# Patient Record
Sex: Female | Born: 1940
Health system: Southern US, Community
[De-identification: ages and names within clinical notes are randomized; demographics above are authoritative.]

## PROBLEM LIST (undated history)

## (undated) DIAGNOSIS — L409 Psoriasis, unspecified: Secondary | ICD-10-CM

## (undated) DIAGNOSIS — E538 Deficiency of other specified B group vitamins: Secondary | ICD-10-CM

## (undated) DIAGNOSIS — F419 Anxiety disorder, unspecified: Secondary | ICD-10-CM

## (undated) DIAGNOSIS — I10 Essential (primary) hypertension: Secondary | ICD-10-CM

## (undated) DIAGNOSIS — E039 Hypothyroidism, unspecified: Secondary | ICD-10-CM

## (undated) DIAGNOSIS — C50919 Malignant neoplasm of unspecified site of unspecified female breast: Secondary | ICD-10-CM

## (undated) DIAGNOSIS — Z9221 Personal history of antineoplastic chemotherapy: Secondary | ICD-10-CM

## (undated) DIAGNOSIS — Z803 Family history of malignant neoplasm of breast: Secondary | ICD-10-CM

## (undated) DIAGNOSIS — Z923 Personal history of irradiation: Secondary | ICD-10-CM

## (undated) DIAGNOSIS — E785 Hyperlipidemia, unspecified: Secondary | ICD-10-CM

## (undated) DIAGNOSIS — C73 Malignant neoplasm of thyroid gland: Secondary | ICD-10-CM

## (undated) DIAGNOSIS — Z9889 Other specified postprocedural states: Secondary | ICD-10-CM

## (undated) DIAGNOSIS — Z1379 Encounter for other screening for genetic and chromosomal anomalies: Principal | ICD-10-CM

## (undated) HISTORY — DX: Other specified postprocedural states: Z98.890

## (undated) HISTORY — DX: Encounter for other screening for genetic and chromosomal anomalies: Z13.79

## (undated) HISTORY — DX: Family history of malignant neoplasm of breast: Z80.3

## (undated) HISTORY — DX: Hypothyroidism, unspecified: E03.9

## (undated) HISTORY — DX: Essential (primary) hypertension: I10

## (undated) HISTORY — DX: Deficiency of other specified B group vitamins: E53.8

## (undated) HISTORY — DX: Psoriasis, unspecified: L40.9

## (undated) HISTORY — DX: Anxiety disorder, unspecified: F41.9

## (undated) HISTORY — DX: Hyperlipidemia, unspecified: E78.5

## (undated) HISTORY — DX: Malignant neoplasm of unspecified site of unspecified female breast: C50.919

## (undated) HISTORY — PX: MASTECTOMY: SHX3

## (undated) HISTORY — DX: Malignant neoplasm of thyroid gland: C73

## (undated) HISTORY — PX: BREAST LUMPECTOMY: SHX2

---

## 1997-06-17 ENCOUNTER — Other Ambulatory Visit: Admission: RE | Admit: 1997-06-17 | Discharge: 1997-06-17 | Payer: Self-pay | Admitting: Obstetrics and Gynecology

## 2000-02-12 DIAGNOSIS — Z923 Personal history of irradiation: Secondary | ICD-10-CM

## 2000-02-12 DIAGNOSIS — Z9221 Personal history of antineoplastic chemotherapy: Secondary | ICD-10-CM

## 2000-02-12 HISTORY — DX: Personal history of irradiation: Z92.3

## 2000-02-12 HISTORY — DX: Personal history of antineoplastic chemotherapy: Z92.21

## 2000-10-09 ENCOUNTER — Encounter: Admission: RE | Admit: 2000-10-09 | Discharge: 2000-10-09 | Payer: Self-pay | Admitting: Obstetrics and Gynecology

## 2000-10-09 ENCOUNTER — Encounter: Payer: Self-pay | Admitting: Obstetrics and Gynecology

## 2000-10-14 ENCOUNTER — Encounter: Payer: Self-pay | Admitting: Obstetrics and Gynecology

## 2000-10-14 ENCOUNTER — Encounter: Admission: RE | Admit: 2000-10-14 | Discharge: 2000-10-14 | Payer: Self-pay | Admitting: Obstetrics and Gynecology

## 2000-10-24 ENCOUNTER — Encounter: Admission: RE | Admit: 2000-10-24 | Discharge: 2000-10-24 | Payer: Self-pay | Admitting: General Surgery

## 2000-10-24 ENCOUNTER — Encounter: Payer: Self-pay | Admitting: General Surgery

## 2000-10-27 ENCOUNTER — Encounter: Admission: RE | Admit: 2000-10-27 | Discharge: 2000-10-27 | Payer: Self-pay | Admitting: General Surgery

## 2000-10-27 ENCOUNTER — Encounter (INDEPENDENT_AMBULATORY_CARE_PROVIDER_SITE_OTHER): Payer: Self-pay | Admitting: *Deleted

## 2000-10-27 ENCOUNTER — Encounter: Payer: Self-pay | Admitting: General Surgery

## 2000-10-27 ENCOUNTER — Ambulatory Visit (HOSPITAL_BASED_OUTPATIENT_CLINIC_OR_DEPARTMENT_OTHER): Admission: RE | Admit: 2000-10-27 | Discharge: 2000-10-27 | Payer: Self-pay | Admitting: Women's Health

## 2000-11-12 ENCOUNTER — Ambulatory Visit: Admission: RE | Admit: 2000-11-12 | Discharge: 2001-02-10 | Payer: Self-pay | Admitting: Radiation Oncology

## 2001-07-10 ENCOUNTER — Ambulatory Visit: Admission: RE | Admit: 2001-07-10 | Discharge: 2001-10-08 | Payer: Self-pay | Admitting: Radiation Oncology

## 2001-07-13 ENCOUNTER — Encounter: Admission: RE | Admit: 2001-07-13 | Discharge: 2001-07-13 | Payer: Self-pay | Admitting: Radiation Oncology

## 2001-11-04 ENCOUNTER — Encounter: Admission: RE | Admit: 2001-11-04 | Discharge: 2001-11-04 | Payer: Self-pay | Admitting: General Surgery

## 2001-11-04 ENCOUNTER — Encounter: Payer: Self-pay | Admitting: General Surgery

## 2001-11-26 ENCOUNTER — Ambulatory Visit: Admission: RE | Admit: 2001-11-26 | Discharge: 2001-11-26 | Payer: Self-pay | Admitting: Radiation Oncology

## 2002-03-05 ENCOUNTER — Other Ambulatory Visit: Admission: RE | Admit: 2002-03-05 | Discharge: 2002-03-05 | Payer: Self-pay | Admitting: Obstetrics and Gynecology

## 2002-05-24 ENCOUNTER — Encounter: Admission: RE | Admit: 2002-05-24 | Discharge: 2002-05-24 | Payer: Self-pay | Admitting: General Surgery

## 2002-05-24 ENCOUNTER — Encounter (INDEPENDENT_AMBULATORY_CARE_PROVIDER_SITE_OTHER): Payer: Self-pay | Admitting: Radiology

## 2002-05-24 ENCOUNTER — Other Ambulatory Visit: Admission: RE | Admit: 2002-05-24 | Discharge: 2002-05-24 | Payer: Self-pay | Admitting: Radiology

## 2002-05-24 ENCOUNTER — Encounter: Payer: Self-pay | Admitting: General Surgery

## 2002-06-07 ENCOUNTER — Encounter: Payer: Self-pay | Admitting: General Surgery

## 2002-06-08 ENCOUNTER — Inpatient Hospital Stay (HOSPITAL_COMMUNITY): Admission: RE | Admit: 2002-06-08 | Discharge: 2002-06-10 | Payer: Self-pay | Admitting: General Surgery

## 2002-06-08 ENCOUNTER — Encounter (INDEPENDENT_AMBULATORY_CARE_PROVIDER_SITE_OTHER): Payer: Self-pay | Admitting: *Deleted

## 2002-06-24 ENCOUNTER — Ambulatory Visit: Admission: RE | Admit: 2002-06-24 | Discharge: 2002-06-24 | Payer: Self-pay | Admitting: Oncology

## 2002-06-24 ENCOUNTER — Encounter: Payer: Self-pay | Admitting: Cardiology

## 2002-06-25 ENCOUNTER — Encounter: Payer: Self-pay | Admitting: Oncology

## 2002-06-25 ENCOUNTER — Encounter: Admission: RE | Admit: 2002-06-25 | Discharge: 2002-06-25 | Payer: Self-pay | Admitting: General Surgery

## 2002-06-25 ENCOUNTER — Encounter: Payer: Self-pay | Admitting: General Surgery

## 2002-06-25 ENCOUNTER — Ambulatory Visit (HOSPITAL_COMMUNITY): Admission: RE | Admit: 2002-06-25 | Discharge: 2002-06-25 | Payer: Self-pay | Admitting: Oncology

## 2002-08-02 ENCOUNTER — Encounter: Payer: Self-pay | Admitting: Oncology

## 2002-08-02 ENCOUNTER — Ambulatory Visit (HOSPITAL_COMMUNITY): Admission: RE | Admit: 2002-08-02 | Discharge: 2002-08-02 | Payer: Self-pay | Admitting: Oncology

## 2002-10-06 ENCOUNTER — Encounter (HOSPITAL_COMMUNITY): Admission: RE | Admit: 2002-10-06 | Discharge: 2002-10-07 | Payer: Self-pay | Admitting: Oncology

## 2002-11-23 ENCOUNTER — Encounter: Payer: Self-pay | Admitting: Oncology

## 2002-11-23 ENCOUNTER — Ambulatory Visit (HOSPITAL_COMMUNITY): Admission: RE | Admit: 2002-11-23 | Discharge: 2002-11-23 | Payer: Self-pay | Admitting: Oncology

## 2003-02-12 HISTORY — PX: REDUCTION MAMMAPLASTY: SUR839

## 2003-03-25 ENCOUNTER — Other Ambulatory Visit: Admission: RE | Admit: 2003-03-25 | Discharge: 2003-03-25 | Payer: Self-pay | Admitting: Obstetrics and Gynecology

## 2003-06-29 ENCOUNTER — Encounter: Admission: RE | Admit: 2003-06-29 | Discharge: 2003-06-29 | Payer: Self-pay | Admitting: Obstetrics and Gynecology

## 2003-10-04 ENCOUNTER — Ambulatory Visit (HOSPITAL_COMMUNITY): Admission: RE | Admit: 2003-10-04 | Discharge: 2003-10-04 | Payer: Self-pay | Admitting: Plastic Surgery

## 2003-10-05 ENCOUNTER — Encounter (INDEPENDENT_AMBULATORY_CARE_PROVIDER_SITE_OTHER): Payer: Self-pay | Admitting: Specialist

## 2003-10-05 ENCOUNTER — Observation Stay (HOSPITAL_COMMUNITY): Admission: RE | Admit: 2003-10-05 | Discharge: 2003-10-06 | Payer: Self-pay | Admitting: Plastic Surgery

## 2004-01-11 ENCOUNTER — Inpatient Hospital Stay (HOSPITAL_COMMUNITY): Admission: RE | Admit: 2004-01-11 | Discharge: 2004-01-13 | Payer: Self-pay | Admitting: Plastic Surgery

## 2004-02-08 ENCOUNTER — Ambulatory Visit (HOSPITAL_COMMUNITY): Admission: RE | Admit: 2004-02-08 | Discharge: 2004-02-08 | Payer: Self-pay | Admitting: Plastic Surgery

## 2004-04-23 ENCOUNTER — Ambulatory Visit: Payer: Self-pay | Admitting: Oncology

## 2004-04-25 ENCOUNTER — Ambulatory Visit (HOSPITAL_COMMUNITY): Admission: RE | Admit: 2004-04-25 | Discharge: 2004-04-25 | Payer: Self-pay | Admitting: Oncology

## 2004-05-01 ENCOUNTER — Encounter: Admission: RE | Admit: 2004-05-01 | Discharge: 2004-05-01 | Payer: Self-pay | Admitting: General Surgery

## 2004-05-07 ENCOUNTER — Encounter (INDEPENDENT_AMBULATORY_CARE_PROVIDER_SITE_OTHER): Payer: Self-pay | Admitting: Specialist

## 2004-05-07 ENCOUNTER — Ambulatory Visit (HOSPITAL_COMMUNITY): Admission: RE | Admit: 2004-05-07 | Discharge: 2004-05-07 | Payer: Self-pay | Admitting: Internal Medicine

## 2004-06-22 ENCOUNTER — Other Ambulatory Visit: Admission: RE | Admit: 2004-06-22 | Discharge: 2004-06-22 | Payer: Self-pay | Admitting: Obstetrics and Gynecology

## 2004-08-03 ENCOUNTER — Ambulatory Visit: Payer: Self-pay | Admitting: Oncology

## 2004-09-21 ENCOUNTER — Ambulatory Visit: Payer: Self-pay | Admitting: Oncology

## 2005-02-11 HISTORY — PX: THYROIDECTOMY: SHX17

## 2005-02-15 ENCOUNTER — Encounter: Admission: RE | Admit: 2005-02-15 | Discharge: 2005-02-15 | Payer: Self-pay | Admitting: General Surgery

## 2005-05-17 ENCOUNTER — Ambulatory Visit: Payer: Self-pay | Admitting: Oncology

## 2005-05-20 LAB — CBC WITH DIFFERENTIAL/PLATELET
BASO%: 0.4 % (ref 0.0–2.0)
MCHC: 33.8 g/dL (ref 32.0–36.0)
MONO#: 0.5 10*3/uL (ref 0.1–0.9)
RBC: 4.74 10*6/uL (ref 3.70–5.32)
WBC: 5 10*3/uL (ref 3.9–10.0)
lymph#: 1.7 10*3/uL (ref 0.9–3.3)

## 2005-05-20 LAB — COMPREHENSIVE METABOLIC PANEL
ALT: 16 U/L (ref 0–40)
CO2: 28 mEq/L (ref 19–32)
Calcium: 9.6 mg/dL (ref 8.4–10.5)
Chloride: 103 mEq/L (ref 96–112)
Potassium: 4.4 mEq/L (ref 3.5–5.3)
Sodium: 140 mEq/L (ref 135–145)
Total Protein: 8.2 g/dL (ref 6.0–8.3)

## 2005-05-20 LAB — CANCER ANTIGEN 27.29: CA 27.29: 14 U/mL (ref 0–39)

## 2005-05-29 ENCOUNTER — Ambulatory Visit (HOSPITAL_COMMUNITY): Admission: RE | Admit: 2005-05-29 | Discharge: 2005-05-29 | Payer: Self-pay | Admitting: Oncology

## 2005-06-05 ENCOUNTER — Ambulatory Visit (HOSPITAL_COMMUNITY): Admission: RE | Admit: 2005-06-05 | Discharge: 2005-06-05 | Payer: Self-pay | Admitting: Oncology

## 2005-07-09 ENCOUNTER — Other Ambulatory Visit: Admission: RE | Admit: 2005-07-09 | Discharge: 2005-07-09 | Payer: Self-pay | Admitting: Obstetrics and Gynecology

## 2005-07-11 ENCOUNTER — Encounter: Admission: RE | Admit: 2005-07-11 | Discharge: 2005-07-11 | Payer: Self-pay | Admitting: Obstetrics and Gynecology

## 2005-07-12 DIAGNOSIS — E890 Postprocedural hypothyroidism: Secondary | ICD-10-CM

## 2005-07-12 DIAGNOSIS — Z9089 Acquired absence of other organs: Secondary | ICD-10-CM

## 2005-07-12 DIAGNOSIS — Z9889 Other specified postprocedural states: Secondary | ICD-10-CM

## 2005-07-12 HISTORY — DX: Postprocedural hypothyroidism: E89.0

## 2005-07-12 HISTORY — DX: Acquired absence of other organs: Z90.89

## 2005-07-12 HISTORY — DX: Other specified postprocedural states: Z98.890

## 2005-07-24 ENCOUNTER — Encounter: Payer: Self-pay | Admitting: Endocrinology

## 2005-11-07 ENCOUNTER — Ambulatory Visit: Payer: Self-pay | Admitting: Oncology

## 2006-02-17 ENCOUNTER — Encounter: Admission: RE | Admit: 2006-02-17 | Discharge: 2006-02-17 | Payer: Self-pay | Admitting: Oncology

## 2006-03-17 ENCOUNTER — Encounter: Payer: Self-pay | Admitting: Endocrinology

## 2006-05-14 ENCOUNTER — Ambulatory Visit: Payer: Self-pay | Admitting: Oncology

## 2006-05-26 LAB — CBC WITH DIFFERENTIAL/PLATELET
BASO%: 0.2 % (ref 0.0–2.0)
EOS%: 1.3 % (ref 0.0–7.0)
HCT: 38.9 % (ref 34.8–46.6)
MCH: 31.9 pg (ref 26.0–34.0)
MCHC: 35.3 g/dL (ref 32.0–36.0)
NEUT%: 66.3 % (ref 39.6–76.8)
lymph#: 1.2 10*3/uL (ref 0.9–3.3)

## 2006-05-26 LAB — COMPREHENSIVE METABOLIC PANEL
ALT: 17 U/L (ref 0–35)
AST: 19 U/L (ref 0–37)
Alkaline Phosphatase: 153 U/L — ABNORMAL HIGH (ref 39–117)
CO2: 26 mEq/L (ref 19–32)
Creatinine, Ser: 0.92 mg/dL (ref 0.40–1.20)
Total Bilirubin: 0.3 mg/dL (ref 0.3–1.2)

## 2006-05-26 LAB — CANCER ANTIGEN 27.29: CA 27.29: 12 U/mL (ref 0–39)

## 2006-09-23 ENCOUNTER — Encounter: Payer: Self-pay | Admitting: Endocrinology

## 2006-11-13 ENCOUNTER — Ambulatory Visit: Payer: Self-pay | Admitting: Oncology

## 2006-11-17 LAB — COMPREHENSIVE METABOLIC PANEL
AST: 20 U/L (ref 0–37)
BUN: 17 mg/dL (ref 6–23)
CO2: 25 mEq/L (ref 19–32)
Calcium: 9.1 mg/dL (ref 8.4–10.5)
Chloride: 103 mEq/L (ref 96–112)
Creatinine, Ser: 0.93 mg/dL (ref 0.40–1.20)

## 2006-11-17 LAB — CBC WITH DIFFERENTIAL/PLATELET
Basophils Absolute: 0 10*3/uL (ref 0.0–0.1)
EOS%: 2.5 % (ref 0.0–7.0)
HCT: 39.5 % (ref 34.8–46.6)
HGB: 14 g/dL (ref 11.6–15.9)
MCH: 32.2 pg (ref 26.0–34.0)
NEUT%: 59.4 % (ref 39.6–76.8)
Platelets: 234 10*3/uL (ref 145–400)
lymph#: 0.9 10*3/uL (ref 0.9–3.3)

## 2006-11-17 LAB — CANCER ANTIGEN 27.29: CA 27.29: 10 U/mL (ref 0–39)

## 2006-11-24 ENCOUNTER — Ambulatory Visit (HOSPITAL_COMMUNITY): Admission: RE | Admit: 2006-11-24 | Discharge: 2006-11-24 | Payer: Self-pay | Admitting: Oncology

## 2007-02-12 DIAGNOSIS — E538 Deficiency of other specified B group vitamins: Secondary | ICD-10-CM

## 2007-02-12 HISTORY — DX: Deficiency of other specified B group vitamins: E53.8

## 2007-02-19 ENCOUNTER — Encounter: Payer: Self-pay | Admitting: Endocrinology

## 2007-03-17 ENCOUNTER — Encounter: Admission: RE | Admit: 2007-03-17 | Discharge: 2007-03-17 | Payer: Self-pay | Admitting: Oncology

## 2007-04-29 ENCOUNTER — Encounter: Payer: Self-pay | Admitting: Endocrinology

## 2007-05-14 ENCOUNTER — Ambulatory Visit: Payer: Self-pay | Admitting: Oncology

## 2007-05-18 LAB — CBC WITH DIFFERENTIAL/PLATELET
Basophils Absolute: 0 10*3/uL (ref 0.0–0.1)
Eosinophils Absolute: 0 10*3/uL (ref 0.0–0.5)
HCT: 39 % (ref 34.8–46.6)
LYMPH%: 34.8 % (ref 14.0–48.0)
MONO#: 0.2 10*3/uL (ref 0.1–0.9)
NEUT#: 1.9 10*3/uL (ref 1.5–6.5)
NEUT%: 56.4 % (ref 39.6–76.8)
Platelets: 267 10*3/uL (ref 145–400)
WBC: 3.4 10*3/uL — ABNORMAL LOW (ref 3.9–10.0)

## 2007-05-19 LAB — COMPREHENSIVE METABOLIC PANEL
Albumin: 4.3 g/dL (ref 3.5–5.2)
CO2: 26 mEq/L (ref 19–32)
Glucose, Bld: 96 mg/dL (ref 70–99)
Potassium: 4.5 mEq/L (ref 3.5–5.3)
Sodium: 142 mEq/L (ref 135–145)
Total Bilirubin: 0.5 mg/dL (ref 0.3–1.2)
Total Protein: 7.2 g/dL (ref 6.0–8.3)

## 2007-05-19 LAB — CANCER ANTIGEN 27.29: CA 27.29: 14 U/mL (ref 0–39)

## 2007-07-13 ENCOUNTER — Telehealth (INDEPENDENT_AMBULATORY_CARE_PROVIDER_SITE_OTHER): Payer: Self-pay | Admitting: *Deleted

## 2007-08-13 ENCOUNTER — Encounter: Payer: Self-pay | Admitting: Internal Medicine

## 2007-08-19 ENCOUNTER — Ambulatory Visit: Payer: Self-pay | Admitting: Internal Medicine

## 2007-08-19 DIAGNOSIS — Z853 Personal history of malignant neoplasm of breast: Secondary | ICD-10-CM | POA: Insufficient documentation

## 2007-08-19 DIAGNOSIS — D497 Neoplasm of unspecified behavior of endocrine glands and other parts of nervous system: Secondary | ICD-10-CM | POA: Insufficient documentation

## 2007-08-19 DIAGNOSIS — F4321 Adjustment disorder with depressed mood: Secondary | ICD-10-CM | POA: Insufficient documentation

## 2007-08-19 DIAGNOSIS — F411 Generalized anxiety disorder: Secondary | ICD-10-CM | POA: Insufficient documentation

## 2007-08-19 DIAGNOSIS — F419 Anxiety disorder, unspecified: Secondary | ICD-10-CM | POA: Insufficient documentation

## 2007-08-19 DIAGNOSIS — J309 Allergic rhinitis, unspecified: Secondary | ICD-10-CM | POA: Insufficient documentation

## 2007-08-19 DIAGNOSIS — E1169 Type 2 diabetes mellitus with other specified complication: Secondary | ICD-10-CM | POA: Insufficient documentation

## 2007-08-19 DIAGNOSIS — E785 Hyperlipidemia, unspecified: Secondary | ICD-10-CM | POA: Insufficient documentation

## 2007-09-03 ENCOUNTER — Other Ambulatory Visit: Admission: RE | Admit: 2007-09-03 | Discharge: 2007-09-03 | Payer: Self-pay | Admitting: Obstetrics and Gynecology

## 2007-11-06 ENCOUNTER — Encounter: Payer: Self-pay | Admitting: Endocrinology

## 2007-11-11 ENCOUNTER — Ambulatory Visit: Payer: Self-pay | Admitting: Internal Medicine

## 2007-11-11 ENCOUNTER — Telehealth: Payer: Self-pay | Admitting: Internal Medicine

## 2007-11-11 DIAGNOSIS — J45909 Unspecified asthma, uncomplicated: Secondary | ICD-10-CM | POA: Insufficient documentation

## 2007-11-11 DIAGNOSIS — J019 Acute sinusitis, unspecified: Secondary | ICD-10-CM | POA: Insufficient documentation

## 2007-11-11 DIAGNOSIS — J0100 Acute maxillary sinusitis, unspecified: Secondary | ICD-10-CM | POA: Insufficient documentation

## 2007-11-12 ENCOUNTER — Telehealth: Payer: Self-pay | Admitting: Internal Medicine

## 2007-11-12 ENCOUNTER — Encounter: Payer: Self-pay | Admitting: Endocrinology

## 2007-11-13 ENCOUNTER — Ambulatory Visit: Payer: Self-pay | Admitting: Oncology

## 2007-11-13 ENCOUNTER — Telehealth: Payer: Self-pay | Admitting: Internal Medicine

## 2007-11-14 ENCOUNTER — Telehealth: Payer: Self-pay | Admitting: Family Medicine

## 2007-11-17 LAB — CBC WITH DIFFERENTIAL/PLATELET
Basophils Absolute: 0 10*3/uL (ref 0.0–0.1)
EOS%: 1.8 % (ref 0.0–7.0)
Eosinophils Absolute: 0.1 10*3/uL (ref 0.0–0.5)
LYMPH%: 21.5 % (ref 14.0–48.0)
MCH: 32.5 pg (ref 26.0–34.0)
MCV: 94.8 fL (ref 81.0–101.0)
MONO%: 4.8 % (ref 0.0–13.0)
NEUT#: 5.7 10*3/uL (ref 1.5–6.5)
Platelets: 486 10*3/uL — ABNORMAL HIGH (ref 145–400)
RBC: 4.62 10*6/uL (ref 3.70–5.32)

## 2007-11-18 LAB — COMPREHENSIVE METABOLIC PANEL
Alkaline Phosphatase: 130 U/L — ABNORMAL HIGH (ref 39–117)
BUN: 18 mg/dL (ref 6–23)
Glucose, Bld: 129 mg/dL — ABNORMAL HIGH (ref 70–99)
Sodium: 140 mEq/L (ref 135–145)
Total Bilirubin: 0.4 mg/dL (ref 0.3–1.2)

## 2007-11-19 ENCOUNTER — Ambulatory Visit (HOSPITAL_COMMUNITY): Admission: RE | Admit: 2007-11-19 | Discharge: 2007-11-19 | Payer: Self-pay | Admitting: Oncology

## 2007-11-24 ENCOUNTER — Encounter: Payer: Self-pay | Admitting: Internal Medicine

## 2007-12-01 ENCOUNTER — Ambulatory Visit: Payer: Self-pay | Admitting: Endocrinology

## 2007-12-01 DIAGNOSIS — E890 Postprocedural hypothyroidism: Secondary | ICD-10-CM | POA: Insufficient documentation

## 2007-12-04 ENCOUNTER — Ambulatory Visit: Payer: Self-pay | Admitting: Internal Medicine

## 2007-12-07 LAB — CONVERTED CEMR LAB
ALT: 21 units/L (ref 0–35)
AST: 31 units/L (ref 0–37)
Alkaline Phosphatase: 120 units/L — ABNORMAL HIGH (ref 39–117)
Bacteria, UA: NEGATIVE
Basophils Relative: 1.8 % (ref 0.0–3.0)
Bilirubin, Direct: 0.2 mg/dL (ref 0.0–0.3)
CO2: 32 meq/L (ref 19–32)
Calcium: 9.1 mg/dL (ref 8.4–10.5)
Chloride: 106 meq/L (ref 96–112)
Cholesterol: 188 mg/dL (ref 0–200)
Eosinophils Absolute: 0.1 10*3/uL (ref 0.0–0.7)
Eosinophils Relative: 3.4 % (ref 0.0–5.0)
Folate: 10.8 ng/mL
Glucose, Bld: 97 mg/dL (ref 70–99)
HCT: 43.8 % (ref 36.0–46.0)
MCV: 96.2 fL (ref 78.0–100.0)
Monocytes Relative: 7.7 % (ref 3.0–12.0)
Mucus, UA: NEGATIVE
Neutrophils Relative %: 51.7 % (ref 43.0–77.0)
Nitrite: NEGATIVE
Potassium: 4.6 meq/L (ref 3.5–5.1)
RBC: 4.56 M/uL (ref 3.87–5.11)
Sodium: 143 meq/L (ref 135–145)
Specific Gravity, Urine: >=1.03 (ref 1.000–1.03)
Total Bilirubin: 1.2 mg/dL (ref 0.3–1.2)
Total Protein, Urine: 30 mg/dL — AB
Total Protein: 7.5 g/dL (ref 6.0–8.3)
Urine Glucose: NEGATIVE mg/dL
Urobilinogen, UA: 1 (ref 0.0–1.0)
WBC: 3.3 10*3/uL — ABNORMAL LOW (ref 4.5–10.5)

## 2007-12-09 ENCOUNTER — Telehealth: Payer: Self-pay | Admitting: Endocrinology

## 2007-12-11 ENCOUNTER — Encounter: Payer: Self-pay | Admitting: Endocrinology

## 2007-12-16 ENCOUNTER — Ambulatory Visit: Payer: Self-pay | Admitting: Internal Medicine

## 2007-12-16 DIAGNOSIS — E559 Vitamin D deficiency, unspecified: Secondary | ICD-10-CM | POA: Insufficient documentation

## 2007-12-16 DIAGNOSIS — E538 Deficiency of other specified B group vitamins: Secondary | ICD-10-CM | POA: Insufficient documentation

## 2007-12-20 ENCOUNTER — Telehealth: Payer: Self-pay | Admitting: Endocrinology

## 2008-02-15 ENCOUNTER — Ambulatory Visit: Payer: Self-pay | Admitting: Internal Medicine

## 2008-02-15 LAB — CONVERTED CEMR LAB
BUN: 13 mg/dL (ref 6–23)
CO2: 31 meq/L (ref 19–32)
Calcium: 9.3 mg/dL (ref 8.4–10.5)
Chloride: 106 meq/L (ref 96–112)
Creatinine, Ser: 0.9 mg/dL (ref 0.4–1.2)
Glucose, Bld: 107 mg/dL — ABNORMAL HIGH (ref 70–99)
Vitamin B-12: 442 pg/mL (ref 211–911)

## 2008-02-17 ENCOUNTER — Ambulatory Visit: Payer: Self-pay | Admitting: Internal Medicine

## 2008-02-17 DIAGNOSIS — H00019 Hordeolum externum unspecified eye, unspecified eyelid: Secondary | ICD-10-CM | POA: Insufficient documentation

## 2008-03-23 ENCOUNTER — Encounter: Admission: RE | Admit: 2008-03-23 | Discharge: 2008-03-23 | Payer: Self-pay | Admitting: Internal Medicine

## 2008-05-11 ENCOUNTER — Telehealth (INDEPENDENT_AMBULATORY_CARE_PROVIDER_SITE_OTHER): Payer: Self-pay | Admitting: *Deleted

## 2008-05-12 ENCOUNTER — Ambulatory Visit: Payer: Self-pay | Admitting: Oncology

## 2008-05-17 ENCOUNTER — Ambulatory Visit (HOSPITAL_COMMUNITY): Admission: RE | Admit: 2008-05-17 | Discharge: 2008-05-17 | Payer: Self-pay | Admitting: Oncology

## 2008-05-17 ENCOUNTER — Encounter: Payer: Self-pay | Admitting: Pulmonary Disease

## 2008-05-17 ENCOUNTER — Telehealth: Payer: Self-pay | Admitting: Internal Medicine

## 2008-05-17 LAB — COMPREHENSIVE METABOLIC PANEL
ALT: 17 U/L (ref 0–35)
AST: 24 U/L (ref 0–37)
Calcium: 9.3 mg/dL (ref 8.4–10.5)
Chloride: 105 mEq/L (ref 96–112)
Creatinine, Ser: 0.93 mg/dL (ref 0.40–1.20)
Potassium: 4.3 mEq/L (ref 3.5–5.3)

## 2008-05-17 LAB — CBC WITH DIFFERENTIAL/PLATELET
BASO%: 0.5 % (ref 0.0–2.0)
EOS%: 3.7 % (ref 0.0–7.0)
MCH: 31.8 pg (ref 25.1–34.0)
MCHC: 34.8 g/dL (ref 31.5–36.0)
NEUT%: 60.3 % (ref 38.4–76.8)
RDW: 12.3 % (ref 11.2–14.5)
lymph#: 1 10*3/uL (ref 0.9–3.3)

## 2008-05-30 ENCOUNTER — Telehealth: Payer: Self-pay | Admitting: Internal Medicine

## 2008-05-30 DIAGNOSIS — R930 Abnormal findings on diagnostic imaging of skull and head, not elsewhere classified: Secondary | ICD-10-CM | POA: Insufficient documentation

## 2008-06-17 ENCOUNTER — Ambulatory Visit: Payer: Self-pay | Admitting: Pulmonary Disease

## 2008-08-17 ENCOUNTER — Ambulatory Visit: Payer: Self-pay | Admitting: Internal Medicine

## 2008-09-06 ENCOUNTER — Other Ambulatory Visit: Admission: RE | Admit: 2008-09-06 | Discharge: 2008-09-06 | Payer: Self-pay | Admitting: Obstetrics and Gynecology

## 2008-10-18 ENCOUNTER — Ambulatory Visit: Payer: Self-pay | Admitting: Internal Medicine

## 2008-10-18 LAB — CONVERTED CEMR LAB
ALT: 19 units/L (ref 0–35)
AST: 24 units/L (ref 0–37)
BUN: 14 mg/dL (ref 6–23)
Basophils Relative: 0.1 % (ref 0.0–3.0)
CO2: 30 meq/L (ref 19–32)
Chloride: 103 meq/L (ref 96–112)
Cholesterol: 200 mg/dL (ref 0–200)
Creatinine, Ser: 0.9 mg/dL (ref 0.4–1.2)
HCT: 41.1 % (ref 36.0–46.0)
Hemoglobin: 14.1 g/dL (ref 12.0–15.0)
Lymphocytes Relative: 37 % (ref 12.0–46.0)
Lymphs Abs: 1.4 10*3/uL (ref 0.7–4.0)
Monocytes Relative: 9.6 % (ref 3.0–12.0)
Neutro Abs: 1.8 10*3/uL (ref 1.4–7.7)
Potassium: 3.5 meq/L (ref 3.5–5.1)
RBC: 4.36 M/uL (ref 3.87–5.11)
RDW: 13 % (ref 11.5–14.6)
TSH: 0.48 microintl units/mL (ref 0.35–5.50)
Total Bilirubin: 0.9 mg/dL (ref 0.3–1.2)
Total CHOL/HDL Ratio: 6
Total Protein: 7.5 g/dL (ref 6.0–8.3)
Triglycerides: 290 mg/dL — ABNORMAL HIGH (ref 0.0–149.0)
Vit D, 25-Hydroxy: 23 ng/mL — ABNORMAL LOW (ref 30–89)

## 2008-10-19 ENCOUNTER — Ambulatory Visit: Payer: Self-pay | Admitting: Internal Medicine

## 2008-10-19 DIAGNOSIS — L259 Unspecified contact dermatitis, unspecified cause: Secondary | ICD-10-CM | POA: Insufficient documentation

## 2009-03-09 ENCOUNTER — Ambulatory Visit: Payer: Self-pay | Admitting: Internal Medicine

## 2009-03-09 LAB — CONVERTED CEMR LAB
CO2: 30 meq/L (ref 19–32)
Chloride: 104 meq/L (ref 96–112)
Glucose, Bld: 98 mg/dL (ref 70–99)
Potassium: 4.4 meq/L (ref 3.5–5.1)
Sodium: 143 meq/L (ref 135–145)
Triglycerides: 209 mg/dL — ABNORMAL HIGH (ref 0.0–149.0)

## 2009-03-13 ENCOUNTER — Ambulatory Visit: Payer: Self-pay | Admitting: Internal Medicine

## 2009-03-31 ENCOUNTER — Encounter: Admission: RE | Admit: 2009-03-31 | Discharge: 2009-03-31 | Payer: Self-pay | Admitting: Internal Medicine

## 2009-07-11 ENCOUNTER — Ambulatory Visit: Payer: Self-pay | Admitting: Internal Medicine

## 2009-07-11 DIAGNOSIS — R42 Dizziness and giddiness: Secondary | ICD-10-CM | POA: Insufficient documentation

## 2009-07-11 LAB — CONVERTED CEMR LAB

## 2009-07-13 ENCOUNTER — Telehealth: Payer: Self-pay | Admitting: Internal Medicine

## 2009-07-13 LAB — CONVERTED CEMR LAB
ALT: 18 units/L (ref 0–35)
AST: 27 units/L (ref 0–37)
BUN: 14 mg/dL (ref 6–23)
Bilirubin, Direct: 0.1 mg/dL (ref 0.0–0.3)
CO2: 29 meq/L (ref 19–32)
Calcium: 8.9 mg/dL (ref 8.4–10.5)
GFR calc non Af Amer: 64.09 mL/min (ref >60)
Glucose, Bld: 120 mg/dL — ABNORMAL HIGH (ref 70–99)
Total Bilirubin: 0.4 mg/dL (ref 0.3–1.2)

## 2009-08-29 ENCOUNTER — Ambulatory Visit: Payer: Self-pay | Admitting: Oncology

## 2009-08-29 LAB — COMPREHENSIVE METABOLIC PANEL
Alkaline Phosphatase: 107 U/L (ref 39–117)
CO2: 28 mEq/L (ref 19–32)
Creatinine, Ser: 0.96 mg/dL (ref 0.40–1.20)
Glucose, Bld: 81 mg/dL (ref 70–99)
Total Bilirubin: 0.7 mg/dL (ref 0.3–1.2)

## 2009-08-29 LAB — CBC WITH DIFFERENTIAL/PLATELET
BASO%: 0.3 % (ref 0.0–2.0)
Eosinophils Absolute: 0.1 10*3/uL (ref 0.0–0.5)
HCT: 42 % (ref 34.8–46.6)
LYMPH%: 28.8 % (ref 14.0–49.7)
MCHC: 32.9 g/dL (ref 31.5–36.0)
MCV: 93.5 fL (ref 79.5–101.0)
MONO#: 0.5 10*3/uL (ref 0.1–0.9)
MONO%: 9.3 % (ref 0.0–14.0)
NEUT%: 59.5 % (ref 38.4–76.8)
Platelets: 292 10*3/uL (ref 145–400)
RBC: 4.49 10*6/uL (ref 3.70–5.45)
WBC: 4.9 10*3/uL (ref 3.9–10.3)

## 2009-08-30 LAB — VITAMIN D 25 HYDROXY (VIT D DEFICIENCY, FRACTURES): Vit D, 25-Hydroxy: 22 ng/mL — ABNORMAL LOW (ref 30–89)

## 2009-08-30 LAB — CANCER ANTIGEN 27.29: CA 27.29: 15 U/mL (ref 0–39)

## 2009-09-18 ENCOUNTER — Encounter: Payer: Self-pay | Admitting: Internal Medicine

## 2009-11-06 ENCOUNTER — Ambulatory Visit: Payer: Self-pay | Admitting: Internal Medicine

## 2009-11-06 LAB — CONVERTED CEMR LAB
Cholesterol: 256 mg/dL — ABNORMAL HIGH
Direct LDL: 168.9 mg/dL
HDL: 37.7 mg/dL — ABNORMAL LOW
TSH: 0.52 u[IU]/mL
Total CHOL/HDL Ratio: 7
Triglycerides: 264 mg/dL — ABNORMAL HIGH
VLDL: 52.8 mg/dL — ABNORMAL HIGH

## 2009-11-08 ENCOUNTER — Ambulatory Visit: Payer: Self-pay | Admitting: Internal Medicine

## 2009-11-08 DIAGNOSIS — R1031 Right lower quadrant pain: Secondary | ICD-10-CM | POA: Insufficient documentation

## 2009-11-08 DIAGNOSIS — R5382 Chronic fatigue, unspecified: Secondary | ICD-10-CM | POA: Insufficient documentation

## 2009-11-09 LAB — CONVERTED CEMR LAB
BUN: 17 mg/dL (ref 6–23)
Basophils Absolute: 0 10*3/uL (ref 0.0–0.1)
Basophils Relative: 0.4 % (ref 0.0–3.0)
Bilirubin, Direct: 0.1 mg/dL (ref 0.0–0.3)
Calcium: 9.1 mg/dL (ref 8.4–10.5)
Chloride: 107 meq/L (ref 96–112)
Creatinine, Ser: 1.1 mg/dL (ref 0.4–1.2)
Eosinophils Absolute: 0.1 10*3/uL (ref 0.0–0.7)
GFR calc non Af Amer: 51.56 mL/min (ref >60)
Hemoglobin, Urine: NEGATIVE
Hemoglobin: 14.3 g/dL (ref 12.0–15.0)
Lymphocytes Relative: 38.5 % (ref 12.0–46.0)
Lymphs Abs: 1.4 10*3/uL (ref 0.7–4.0)
MCHC: 34.7 g/dL (ref 30.0–36.0)
MCV: 94.3 fL (ref 78.0–100.0)
Monocytes Absolute: 0.3 10*3/uL (ref 0.1–1.0)
Neutro Abs: 1.8 10*3/uL (ref 1.4–7.7)
Nitrite: NEGATIVE
RDW: 13.1 % (ref 11.5–14.6)
Specific Gravity, Urine: >=1.03 (ref 1.000–1.030)
Total Bilirubin: 0.7 mg/dL (ref 0.3–1.2)
Total Protein, Urine: NEGATIVE mg/dL
Urine Glucose: NEGATIVE mg/dL
pH: 5.5 (ref 5.0–8.0)

## 2009-11-10 ENCOUNTER — Telehealth: Payer: Self-pay | Admitting: Internal Medicine

## 2009-11-10 ENCOUNTER — Ambulatory Visit: Payer: Self-pay | Admitting: Cardiology

## 2009-11-14 ENCOUNTER — Telehealth: Payer: Self-pay | Admitting: Internal Medicine

## 2009-11-22 ENCOUNTER — Ambulatory Visit: Payer: Self-pay | Admitting: Internal Medicine

## 2009-11-22 DIAGNOSIS — M25519 Pain in unspecified shoulder: Secondary | ICD-10-CM | POA: Insufficient documentation

## 2009-12-12 ENCOUNTER — Ambulatory Visit: Payer: Self-pay | Admitting: Internal Medicine

## 2009-12-12 DIAGNOSIS — N63 Unspecified lump in unspecified breast: Secondary | ICD-10-CM | POA: Insufficient documentation

## 2010-02-10 ENCOUNTER — Ambulatory Visit: Admit: 2010-02-10 | Payer: Self-pay | Admitting: Family Medicine

## 2010-02-16 ENCOUNTER — Telehealth: Payer: Self-pay | Admitting: Internal Medicine

## 2010-02-23 ENCOUNTER — Ambulatory Visit
Admission: RE | Admit: 2010-02-23 | Discharge: 2010-02-23 | Payer: Self-pay | Source: Home / Self Care | Attending: Internal Medicine | Admitting: Internal Medicine

## 2010-02-23 DIAGNOSIS — E1159 Type 2 diabetes mellitus with other circulatory complications: Secondary | ICD-10-CM | POA: Insufficient documentation

## 2010-02-23 DIAGNOSIS — I10 Essential (primary) hypertension: Secondary | ICD-10-CM | POA: Insufficient documentation

## 2010-03-01 ENCOUNTER — Telehealth: Payer: Self-pay | Admitting: Internal Medicine

## 2010-03-03 ENCOUNTER — Other Ambulatory Visit: Payer: Self-pay | Admitting: Internal Medicine

## 2010-03-03 DIAGNOSIS — Z1239 Encounter for other screening for malignant neoplasm of breast: Secondary | ICD-10-CM

## 2010-03-04 ENCOUNTER — Encounter: Payer: Self-pay | Admitting: General Surgery

## 2010-03-04 ENCOUNTER — Encounter: Payer: Self-pay | Admitting: Oncology

## 2010-03-04 ENCOUNTER — Encounter: Payer: Self-pay | Admitting: Plastic Surgery

## 2010-03-15 NOTE — Assessment & Plan Note (Signed)
Summary: 2 wk rov [name] [name] #   Vital Signs:  Patient profile:   70 year old female Height:      65 inches Weight:      143 pounds BMI:     23.88 Temp:     98.8 degrees F oral Pulse rate:   92 / minute Pulse rhythm:   regular Resp:     16 per minute BP sitting:   130 / 90  (left arm) Cuff size:   large  Vitals Entered By: Lanier Prude, Beverly Gust) (November 22, 2009 3:08 PM)  Procedure Note  Injections: The patient complains of pain. Duration of symptoms: 5 days Indication: acute pain Consent signed: yes  Procedure # 1: joint injection    Region: lateral    Location: left shoulder    Technique: 25 g needle    Medication: 40 mg depomedrol    Anesthesia: 3.0 ml 1% lidocaine w/o epinephrine  Procedure # 2: trigger point injection x2    Region: dorsal    Location: L trap    Technique: 25 g needle    Medication: 20 mg depomedrol    Anesthesia: 2.0 ml 1% lidocaine w/o epinephrine    Comment: Risks including but not limited by incomplete procedure, bleeding, infection, recurrence were discussed with the patient. Consent form was signed. Tolerated well. Complicatons - none. Good pain relief following the procedure.   Cleaned and prepped with: alcohol and betadine Wound dressing: bandaid  CC: 2 wk f/u. c/o Lt shoulder pain and hand tingling X 6 days Is Patient Diabetic? No Comments pt is not taking Hydroxyzine  or Loratadine   Primary Care Provider:  Plotnikov  CC:  2 wk f/u. c/o Lt shoulder pain and hand tingling X 6 days.  History of Present Illness: F/u abd pain C/o bad  L shoulder pain x 1 wk after working on a house (cleaning)  Allergies: 1)  ! Sulfacetamide Sodium-Sulfur (Sulfacetamide Sodium-Sulfur)  Past History:  Past Medical History: Last updated: 03/13/2009 Thyroid Ca, papillary stage 1  Dr Everardo All 6/07: thyroidect 2 cm largest diameter (1 other tiny focus) 7/07:   i-131 rx 99 [facility] 10/07: thyroglogulin undetectable (ab neg) 2/08:   thyroglobulin  undetectable (ab neg) 8/08:  thyroglobulin undetectable (ab neg) 1/09:   thyrogen body scan neg 3/09:   thyroglobulin undetectable (ab neg) 9/09:  thyroglobulin undetectable (ab neg) Hyperlipidemia Hypothyroidism Breast cancer R, hx of 2004, recurrent in 2006 Dr Darnelle Catalan GI Dr Kinnie Scales Colon 2009 Vit B12 def 2009 Vit D def 2009 Anxiety Depression Dr Evelene Croon Allergic rhinitis Gyn Dr Salvadore Dom Psoriasis  Past Surgical History: Last updated: 08/19/2007 Thyroidectomy 2007 Lumpectomy Mastectomy R  Social History: Last updated: 06/17/2008 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   Review of Systems  The patient denies fever, dyspnea on exertion, and abdominal pain.    Physical Exam  General:  NAD Nose:  no external deformity, nasal dischargemucosal pallor, and mucosal edema.   Mouth:  Oral mucosa and oropharynx without lesions or exudates.  Teeth in good repair. Lungs:  totally clear to auscultation Abdomen:  soft and tender in RLQ, no mass, bs+no hepatomegaly and no splenomegaly.   Msk:  L shoulde subacr is very  tender and L trap is tender Extremities:  no significant edema, pulses intact distally Neurologic:  alert and oriented, moves all 4. Skin:  normal texture and temp  not diaphoretic Patch of eczema on R lower  shin  Psych:  Oriented X3 and slightly anxious. slightly anxious.     Impression & Recommendations:  Problem # 1:  SHOULDER PAIN (ICD-719.41) L Assessment New She asked for an injection  Problem # 2:  RLQ PAIN (ICD-789.03) resolved Assessment: Comment Only CT/labs reviewed w/pt  Complete Medication List: 1)  Alprazolam 0.25 Mg Tabs (Alprazolam) .... Take 1 tab by mouth once to twice a day as needed 2)  Levothyroxine Sodium 88 Mcg Tabs (Levothyroxine sodium) .[name].. 1 by mouth daily 3)  Pristiq 50 Mg Xr24h-tab (Desvenlafaxine succinate) .... Once daily 4)  Crestor 20 Mg  Tabs (Rosuvastatin calcium) .[name].. 1 tablet by mouth daily 5)  Mometasone Furoate 0.1 % Oint (Mometasone furoate) .... Use two times a day for rash 6)  Hydroxyzine Hcl 25 Mg Tabs (Hydroxyzine hcl) .[name].. 1-2 tabs by mouth two times a day as needed itching or for insomnia 7)  Vitamin D3 1000 Unit Tabs (Cholecalciferol) .... 2 by mouth daily 8)  Aspirin 81 Mg Tbec (Aspirin) .... One by mouth every day 9)  Vitamin B-12 Cr 1000 Mcg Tbcr (Cyanocobalamin) .... Take one tablet by mouth daily 10)  Loratadine 10 Mg Tabs (Loratadine) .[name].. 1 by mouth once daily as needed allergies  Other Orders: Joint Aspirate / Injection, Large (20610) Depo- Medrol 40mg  (J1030) Trigger Point Injection (1 or 2 muscles) ([address]) Depo-Medrol 20mg  (J1020)  Patient Instructions: 1)  Please schedule a follow-up appointment in 3 months. 2)  Use stretching exercises that I have provided

## 2010-03-15 NOTE — Progress Notes (Signed)
Summary: RESULTS  Phone Note Call from Patient   Summary of Call: Patient walked in office concerned about CT results. PER MD, no cancer, no diverticulitis or appendicitis,  take cipro two times a day x 5 days then stop. Pt informed.  Initial call taken by: Lamar Sprinkles, CMA,  November 10, 2009 3:21 PM  Follow-up for Phone Call        agree Thx Follow-up by: Tresa Garter MD,  November 10, 2009 5:06 PM

## 2010-03-15 NOTE — Progress Notes (Signed)
Summary: MORE MEDS?   Phone Note Call from Patient   Summary of Call: Pt was seen last wk - given rx's. She is not feeling better - c/o alot of sinus congestion. Should pt take another zpak?  Initial call taken by: Lamar Sprinkles, CMA,  March 01, 2010 2:17 PM  Follow-up for Phone Call        use a sinus rinse/ netti pot loratidine 10 mg once daily ov if sick Follow-up by: Tresa Garter MD,  March 01, 2010 6:27 PM  Additional Follow-up for Phone Call Additional follow up Details #1::        Pt informed  Additional Follow-up by: Lamar Sprinkles, CMA,  March 02, 2010 10:26 AM

## 2010-03-15 NOTE — Miscellaneous (Signed)
Summary: LT Shoulder inject/[facility]  LT Shoulder inject/[facility]   Imported By: Sherian Rein 11/24/2009 09:38:24  _____________________________________________________________________  External Attachment:    Type:   Image     Comment:   External Document

## 2010-03-15 NOTE — Progress Notes (Signed)
Summary: MORE ABX?   Phone Note Call from Patient Call back at [facility] Phone [phone]   Summary of Call: Pt left vm, She is about 90 percent better but continues to have small amount of discomfort. Should she do more than 5 days of cipro?  Initial call taken by: Lamar Sprinkles, CMA,  November 14, 2009 2:02 PM  Follow-up for Phone Call        ok 5 more days Follow-up by: Tresa Garter MD,  November 14, 2009 5:16 PM  Additional Follow-up for Phone Call Additional follow up Details #1::        Pt informed  Additional Follow-up by: Lamar Sprinkles, CMA,  November 14, 2009 5:33 PM

## 2010-03-15 NOTE — Assessment & Plan Note (Signed)
Summary: rs'd from bmp list/#/cd   Vital Signs:  Patient profile:   70 year old female Weight:      143 pounds Temp:     97.1 degrees F oral Pulse rate:   119 / minute BP sitting:   132 / 96  (left arm)  Vitals Entered By: Tora Perches (March 13, 2009 1:37 PM) CC: f/u Is Patient Diabetic? No   Primary Care Provider:  Plotnikov  CC:  f/u.  History of Present Illness: The patient presents for a follow up of hypothyroidism, vit D def, hyperlipidemia, anxiety   Preventive Screening-Counseling & Management  Alcohol-Tobacco     Smoking Status: never  Current Medications (verified): 1)  Vitamin D3 1000 Unit  Tabs (Cholecalciferol) .... 2 By Mouth Daily 2)  Alprazolam 0.25 Mg Tabs (Alprazolam) .... Take 1 Tab By Mouth Once To Twice A Day As Needed 3)  Aspirin 81 Mg  Tbec (Aspirin) .... One By Mouth Every Day 4)  Levothyroxine Sodium 88 Mcg  Tabs (Levothyroxine Sodium) .[name].. 1 By Mouth Daily 5)  Vitamin B-12 Cr 1000 Mcg  Tbcr (Cyanocobalamin) .... Take One Tablet By Mouth Daily 6)  Pristiq 50 Mg Xr24h-Tab (Desvenlafaxine Succinate) .... Once Daily 7)  Loratadine 10 Mg  Tabs (Loratadine) .... Once Daily As Needed Allergies 8)  Fluticasone Propionate 50 Mcg/act  Susp (Fluticasone Propionate) .... 2 Sprays Each Nostril Once Daily 9)  Crestor 20 Mg Tabs (Rosuvastatin Calcium) .[name].. 1 Tablet By Mouth Daily 10)  Temovate 0.05 % Crea (Clobetasol Propionate) .... Use Bid  Allergies: 1)  ! 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   Past Medical History: Thyroid Ca, papillary stage 1  Dr Everardo All 6/07: thyroidect 2 cm largest diameter (1 other tiny focus) 7/07:   i-131 rx 99 [facility] 10/07:  thyroglogulin undetectable (ab neg) 2/08:   thyroglobulin undetectable (ab neg) 8/08:  thyroglobulin undetectable (ab neg) 1/09:   thyrogen body scan neg 3/09:   thyroglobulin undetectable (ab neg) 9/09:  thyroglobulin undetectable (ab neg) Hyperlipidemia Hypothyroidism Breast cancer R, hx of 2004, recurrent in 2006 Dr Darnelle Catalan GI Dr Kinnie Scales Colon 2009 Vit B12 def 2009 Vit D def 2009 Anxiety Depression Dr Evelene Croon Allergic rhinitis Gyn Dr Salvadore Dom Psoriasis  Review of Systems  The patient denies chest pain, syncope, and dyspnea on exertion.    Physical Exam  General:  NAD Nose:  no external deformity, nasal dischargemucosal pallor, and mucosal edema.   Mouth:  Oral mucosa and oropharynx without lesions or exudates.  Teeth in good repair. Neck:  No deformities, masses, or tenderness noted. Lungs:  totally clear to auscultation Heart:  rrr, no mrg Abdomen:  soft and nontender, bs+ Msk:  No deformity or scoliosis noted of thoracic or lumbar spine.   Extremities:  no significant edema, pulses intact distally Neurologic:  alert and oriented, moves all 4. Skin:  normal texture and temp  not diaphoretic Patch of eczema on R lower shin  Psych:  Oriented X3 and slightly anxious.  Better   Impression & Recommendations:  Problem # 1:  B12 DEFICIENCY (ICD-266.2) Assessment Comment Only Risks of noncompliance with treatment discussed. Compliance encouraged. Zostavax info given  Problem # 2:  VITAMIN D DEFICIENCY (ICD-268.9) Assessment: Deteriorated Risks of noncompliance with treatment discussed. Compliance encouraged.  Problem # 3:  HYPOTHYROIDISM, POSTSURGICAL (ICD-244.0) Assessment: Comment Only  Her updated medication list for this problem includes:    Levothyroxine Sodium 88 Mcg Tabs (Levothyroxine sodium) .[name]... 1 by mouth daily  Orders: Prescription Created Electronically [phone])  Complete Medication List: 1)  Vitamin D3 1000 Unit Tabs (Cholecalciferol) .... 2 by  mouth daily 2)  Alprazolam 0.25 Mg Tabs (Alprazolam) .... Take 1 tab by mouth once to twice a day as needed 3)  Aspirin 81 Mg Tbec (Aspirin) .... One by mouth every day 4)  Levothyroxine Sodium 88 Mcg Tabs (Levothyroxine sodium) .[name].. 1 by mouth daily 5)  Vitamin B-12 Cr 1000 Mcg Tbcr (Cyanocobalamin) .... Take one tablet by mouth daily 6)  Pristiq 50 Mg Xr24h-tab (Desvenlafaxine succinate) .... Once daily 7)  Loratadine 10 Mg Tabs (Loratadine) .... Once daily as needed allergies 8)  Fluticasone Propionate 50 Mcg/act Susp (Fluticasone propionate) .... 2 sprays each nostril once daily 9)  Crestor 20 Mg Tabs (Rosuvastatin calcium) .[name].. 1 tablet by mouth daily 10)  Temovate 0.05 % Crea (Clobetasol propionate) .... Use bid  Patient Instructions: 1)  Please schedule a follow-up appointment in 4 months. 2)  BMP prior to visit, ICD-9: 3)  Vit B12 4)  Vit D 266.20   995.20 268.9 5)  Try to eat more raw plant food, fresh and dry fruit, raw almonds, leafy vegetables, whole foods and less red meat, less animal fat. Poultry and fish is better for you than pork and beef. Avoid processed foods (canned soups, hot dogs, sausage, bacon , frozen dinners). Avoid corn syrup, high fructose syrup or aspartam and Splenda  containing drinks. Honey, Agave and Stevia are better sweeteners. Make your own  dressing with olive oil, wine vinegar, lemon juce, garlic etc. for your salads.  Contraindications/Deferment of Procedures/Staging:    Treatment: Flu Shot    Contraindication: other     Test/Procedure: Pneumovax vaccine    Reason for deferment: patient declined  Prescriptions: LEVOTHYROXINE SODIUM 88 MCG  TABS (LEVOTHYROXINE SODIUM) 1 by mouth daily  #90 x 3   Entered and Authorized by:   Tresa Garter MD   Signed by:   Tresa Garter MD on 03/13/2009   Method used:   [facility]  [address] [id number]       [address]       [place], [place]   [address]       Ph: [phone] or [phone]       Fax: [phone]   RxID:   [id number]

## 2010-03-15 NOTE — Assessment & Plan Note (Signed)
Summary: 3 MONTH FOLLOW UP-LB   Vital Signs:  Patient profile:   70 year old female Height:      65 inches Weight:      145 pounds BMI:     24.22 Temp:     98.2 degrees F oral Pulse rate:   88 / minute Pulse rhythm:   regular Resp:     16 per minute BP sitting:   162 / 110  (left arm) Cuff size:   regular  Vitals Entered By: Lanier Prude, CMA(AAMA) (February 23, 2010 3:56 PM) CC: 3 mo f/u c/o HA x 1wk, sinus pain & congestion Is Patient Diabetic? No   Primary Care Provider:  Roselynn Whitacre  CC:  3 mo f/u c/o HA x 1wk and sinus pain & congestion.  History of Present Illness: The patient presents with complaints of sore throat, fever, cough, sinus congestion and drainge of several days duration. Not better with OTC meds. Chest hurts with coughing. Can't sleep due to cough. Muscle aches are present.  The mucus is colored.  C/o fatigue, anxiety F/u B12 def  Current Medications (verified): 1)  Alprazolam 0.25 Mg Tabs (Alprazolam) .... Take 1 Tab By Mouth Once To Twice A Day As Needed 2)  Levothyroxine Sodium 88 Mcg  Tabs (Levothyroxine Sodium) .[name].. 1 By Mouth Daily 3)  Pristiq 50 Mg Xr24h-Tab (Desvenlafaxine Succinate) .... Once Daily 4)  Crestor 20 Mg Tabs (Rosuvastatin Calcium) .[name].. 1 Tablet By Mouth Daily 5)  Mometasone Furoate 0.1 % Oint (Mometasone Furoate) .... Use Two Times A Day For Rash 6)  Hydroxyzine Hcl 25 Mg Tabs (Hydroxyzine Hcl) .[name].. 1-2 Tabs By Mouth Two Times A Day As Needed Itching or For Insomnia 7)  Vitamin D3 1000 Unit  Tabs (Cholecalciferol) .... 2 By Mouth Daily 8)  Aspirin 81 Mg  Tbec (Aspirin) .... One By Mouth Every Day 9)  Vitamin B-12 Cr 1000 Mcg  Tbcr (Cyanocobalamin) .... Take One Tablet By Mouth Daily 10)  Loratadine 10 Mg Tabs (Loratadine) .[name].. 1 By Mouth Once Daily As Needed Allergies 11)  Promethazine-Codeine 6.25-10 Mg/14ml Syrp (Promethazine-Codeine) .... 5-10 Ml By Mouth Q Id As Needed Cough  Allergies (verified): 1)  ! 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   Past Medical History: Thyroid Ca, papillary stage 1  Dr Everardo All 6/07: thyroidect 2 cm largest diameter (1 other tiny focus) 7/07:   i-131 rx 99 [facility] 10/07: thyroglogulin undetectable (ab neg) 2/08:   thyroglobulin undetectable (ab neg) 8/08:  thyroglobulin undetectable (ab neg) 1/09:   thyrogen body scan neg 3/09:   thyroglobulin undetectable (ab neg) 9/09:  thyroglobulin undetectable (ab neg) Hyperlipidemia Hypothyroidism Breast cancer R, hx of 2004, recurrent in 2006 Dr Darnelle Catalan GI Dr Kinnie Scales Colon 2009 Vit B12 def 2009 Vit D def 2009 Anxiety Depression Dr Evelene Croon Allergic rhinitis Gyn Dr Salvadore Dom Psoriasis Hypertension  Review of Systems       The patient complains of fever and prolonged cough.  The patient denies chest pain and abdominal pain.    Physical Exam  General:  Well-developed,well-nourished,in no acute distress; alert,appropriate and cooperative throughout examination Eyes:  PERRLA and EOMI.   Mouth:  Erythematous throat and intranasal mucosa c/w URI  Neck:  No deformities, masses, or tenderness noted. Lungs:  Normal respiratory effort, chest expands symmetrically. Lungs are clear to auscultation, no crackles or  wheezes. Heart:  Normal rate and regular rhythm. S1 and S2 normal without gallop, murmur, click, rub or other extra sounds. Abdomen:  soft and tender in RLQ, no mass, bs+no hepatomegaly and no splenomegaly.   Msk:  L shoulde subacr is very  tender and L trap is tender Neurologic:  alert and oriented, moves all 4. Skin:  normal texture and temp  not diaphoretic Patch of eczema on R lower shin  Psych:  Oriented X3 and slightly anxious. slightly anxious.     Impression & Recommendations:  Problem # 1:  HYPERTENSION  (ICD-401.9) Assessment New  Her updated medication list for this problem includes:    Losartan Potassium 100 Mg Tabs (Losartan potassium) .[name]... 1 by mouth once daily for blood pressure  BP today: 162/110 Prior BP: 150/100 (12/12/2009)  Labs Reviewed: K+: 4.6 (11/08/2009) Creat: : 1.1 (11/08/2009)   Chol: 256 (11/06/2009)   HDL: 37.70 (11/06/2009)   LDL: 122 (12/04/2007)   TG: 264.0 (11/06/2009)  Problem # 2:  B12 DEFICIENCY (ICD-266.2) Assessment: Unchanged  Orders: Vit B12 1000 mcg (J3420) Admin of Therapeutic Inj  intramuscular or subcutaneous (36644)  Problem # 3:  ACUTE MAXILLARY SINUSITIS (ICD-461.0) Assessment: New  Her updated medication list for this problem includes:    Promethazine-codeine 6.25-10 Mg/58ml Syrp (Promethazine-codeine) .[name]... 5-10 ml by mouth q id as needed cough    Zithromax Z-pak 250 Mg Tabs (Azithromycin) .[name]... As dirrected  Orders: Rocephin  250mg  (I3474) Admin of Therapeutic Inj  intramuscular or subcutaneous (25956)  Problem # 4:  ANXIETY (ICD-300.00) Assessment: Improved  Her updated medication list for this problem includes:    Alprazolam 0.25 Mg Tabs (Alprazolam) .[name]... Take 1 tab by mouth once to twice a day as needed    Pristiq 50 Mg Xr24h-tab (Desvenlafaxine succinate) ..... Once daily    Hydroxyzine Hcl 25 Mg Tabs (Hydroxyzine hcl) .[name]... 1-2 tabs by mouth two times a day as needed itching or for insomnia  Complete Medication List: 1)  Alprazolam 0.25 Mg Tabs (Alprazolam) .... Take 1 tab by mouth once to twice a day as needed 2)  Levothyroxine Sodium 88 Mcg Tabs (Levothyroxine sodium) .[name].. 1 by mouth daily 3)  Pristiq 50 Mg Xr24h-tab (Desvenlafaxine succinate) .... Once daily 4)  Crestor 20 Mg Tabs (Rosuvastatin calcium) .[name].. 1 tablet by mouth daily 5)  Mometasone Furoate 0.1 % Oint (Mometasone furoate) .... Use two times a day for rash 6)  Hydroxyzine Hcl 25 Mg Tabs (Hydroxyzine hcl) .[name].. 1-2 tabs by mouth two times a day as needed  itching or for insomnia 7)  Vitamin D3 1000 Unit Tabs (Cholecalciferol) .... 2 by mouth daily 8)  Aspirin 81 Mg Tbec (Aspirin) .... One by mouth every day 9)  Vitamin B-12 Cr 1000 Mcg Tbcr (Cyanocobalamin) .... Take one tablet by mouth daily 10)  Loratadine 10 Mg Tabs (Loratadine) .[name].. 1 by mouth once daily as needed allergies 11)  Promethazine-codeine 6.25-10 Mg/35ml Syrp (Promethazine-codeine) .... 5-10 ml by mouth q id as needed cough 12)  Losartan Potassium 100 Mg Tabs (Losartan potassium) .[name].. 1 by mouth once daily for blood pressure 13)  Zithromax Z-pak 250 Mg Tabs (Azithromycin) .... As dirrected  Patient Instructions: 1)  Please schedule a follow-up appointment in 1 month. 2)  BMP prior to visit, ICD-9:401.1 3)  Vit B12 266.20 Prescriptions: ZITHROMAX Z-PAK 250 MG TABS (AZITHROMYCIN) as dirrected  #1 x 0   Entered and Authorized by:   Tresa Garter MD   Signed by:  Tresa Garter MD on 02/23/2010   Method used:   [facility]  [address] [id number]       [address]       [place], [place]  [address]       Ph: [phone] or [phone]       Fax: [phone]   RxID:   [id number] LOSARTAN POTASSIUM 100 MG TABS (LOSARTAN POTASSIUM) 1 by mouth once daily for blood pressure  #30 x 12   Entered and Authorized by:   Tresa Garter MD   Signed by:   Tresa Garter MD on 02/23/2010   Method used:   [facility]        [facility]  [address] [id number]       [address]       [place], [place]  [address]       Ph: [phone] or [phone]       Fax: [phone]   RxID:   [id number]    Medication Administration  Injection # 1:    Medication: Vit B12 1000 mcg    Diagnosis: B12 DEFICIENCY (ICD-266.2)    Route: IM    Site: L deltoid    Exp Date: 10/13/2011    Lot #: [id number]    Mfr: [employer]    Patient tolerated injection without complications    Given by:  Lanier Prude, CMA(AAMA) (February 23, 2010 5:27 PM)  Injection # 2:    Medication: Rocephin  250mg     Diagnosis: ACUTE MAXILLARY SINUSITIS (ICD-461.0)    Route: IM    Site: RUOQ gluteus    Exp Date: 06/11/2012    Lot #: [id number]    Mfr: [employer]    Comments: pt rec 1GM    Patient tolerated injection without complications    Given by: Lanier Prude, CMA(AAMA) (February 23, 2010 5:29 PM)  Orders Added: 1)  Vit B12 1000 mcg [J3420] 2)  Rocephin  250mg  [J0696] 3)  Admin of Therapeutic Inj  intramuscular or subcutaneous [96372] 4)  Admin of Therapeutic Inj  intramuscular or subcutaneous [96372] 5)  Est. Patient Level IV [69485]

## 2010-03-15 NOTE — Progress Notes (Signed)
Summary: Cough--Sinus problem  Phone Note Call from Patient   Caller: Patient Call For: Tresa Garter MD Summary of Call: Per pt having sinus problem  w/cough.---[facility] to be [facility] @ [facility] [address]  Pt ph#  [phone] Initial call taken by: Ivar Bury,  February 16, 2010 3:03 PM  Follow-up for Phone Call        Use over-the-counter medicines for "cold": Tylenol  650mg  or Advil 400mg  every 6 hours  for fever; Delsym or Robutussin for cough. Mucinex or Mucinex D for congestion. Ricola or Halls for sore throat. Office visit if not better or if worse.  Follow-up by: Tresa Garter MD,  February 16, 2010 5:12 PM  Additional Follow-up for Phone Call Additional follow up Details #1::        pt informed  Additional Follow-up by: Lanier Prude, [facility]),  February 16, 2010 5:16 PM    New/Updated Medications: PROMETHAZINE-CODEINE 6.25-10 MG/5ML SYRP (PROMETHAZINE-CODEINE) 5-10 ml by mouth q id as needed cough Prescriptions: PROMETHAZINE-CODEINE 6.25-10 MG/5ML SYRP (PROMETHAZINE-CODEINE) 5-10 ml by mouth q id as needed cough  #300 ml x 0   Entered by:   Lanier Prude, CMA(AAMA)   Authorized by:   Tresa Garter MD   Signed by:   Lanier Prude, CMA(AAMA) on 02/16/2010   Method used:   Telephoned to ...       [facility]  [address] [id number]       [address]       [place], [place]  [address]       Ph: [phone] or [phone]       Fax: [phone]   RxID:   [id number]

## 2010-03-15 NOTE — Letter (Signed)
[facility]   Imported By: Sherian Rein 10/10/2009 09:39:10  _____________________________________________________________________  External Attachment:    Type:   Image     Comment:   External Document

## 2010-03-15 NOTE — Progress Notes (Signed)
Summary: Labs  Phone Note From Other Clinic   Summary of Call: Pt would like thyroid labs checked.  Initial call taken by: Lamar Sprinkles, CMA,  July 13, 2009 2:30 PM  Follow-up for Phone Call        She is allergic to house dust only. Other labs nl OK to add TSH Please, mail a copy of all test results from this date  to the patient.   Follow-up by: Tresa Garter MD,  July 14, 2009 7:32 AM  Additional Follow-up for Phone Call Additional follow up Details #1::        Left vm for pt  Additional Follow-up by: Lamar Sprinkles, CMA,  July 14, 2009 5:37 PM

## 2010-03-15 NOTE — Assessment & Plan Note (Signed)
Summary: R BREAST LUMP--SORENESS---STC   Vital Signs:  Patient profile:   70 year old female Height:      65 inches Weight:      142 pounds BMI:     23.72 Temp:     97.9 degrees F oral Pulse rate:   92 / minute Pulse rhythm:   regular Resp:     16 per minute BP sitting:   150 / 100  (left arm) Cuff size:   regular  Vitals Entered By: Lanier Prude, Beverly Gust) (December 12, 2009 3:44 PM) CC: ? lump on/abov rt breast Is Patient Diabetic? No Comments pt states she is not taking Hydroxyzine, Vit D  or Loratadine   Primary Care Provider:  Plotnikov  CC:  ? lump on/abov rt breast.  History of Present Illness: C/o lump on R chest/breast x 1 wk, NT  Current Medications (verified): 1)  Alprazolam 0.25 Mg Tabs (Alprazolam) .... Take 1 Tab By Mouth Once To Twice A Day As Needed 2)  Levothyroxine Sodium 88 Mcg  Tabs (Levothyroxine Sodium) .[name].. 1 By Mouth Daily 3)  Pristiq 50 Mg Xr24h-Tab (Desvenlafaxine Succinate) .... Once Daily 4)  Crestor 20 Mg Tabs (Rosuvastatin Calcium) .[name].. 1 Tablet By Mouth Daily 5)  Mometasone Furoate 0.1 % Oint (Mometasone Furoate) .... Use Two Times A Day For Rash 6)  Hydroxyzine Hcl 25 Mg Tabs (Hydroxyzine Hcl) .[name].. 1-2 Tabs By Mouth Two Times A Day As Needed Itching or For Insomnia 7)  Vitamin D3 1000 Unit  Tabs (Cholecalciferol) .... 2 By Mouth Daily 8)  Aspirin 81 Mg  Tbec (Aspirin) .... One By Mouth Every Day 9)  Vitamin B-12 Cr 1000 Mcg  Tbcr (Cyanocobalamin) .... Take One Tablet By Mouth Daily 10)  Loratadine 10 Mg Tabs (Loratadine) .[name].. 1 By Mouth Once Daily As Needed Allergies  Allergies (verified): 1)  ! Sulfacetamide Sodium-Sulfur (Sulfacetamide Sodium-Sulfur)  Physical Exam  General:  Well-developed,well-nourished,in no acute distress; alert,appropriate and cooperative throughout examination Chest Wall:  The pt is pointing at the 3d anter. rib when is asked to find a mass. Confirmed w/portable US Breasts:  L breast WNL R breast - fat  implant, no mass Lungs:  Normal respiratory effort, chest expands symmetrically. Lungs are clear to auscultation, no crackles or wheezes. Heart:  Normal rate and regular rhythm. S1 and S2 normal without gallop, murmur, click, rub or other extra sounds.   Impression & Recommendations:  Problem # 1:  BREAST MASS (ICD-611.72) L Assessment New It is her 3d rib. Portable US done.  Complete Medication List: 1)  Alprazolam 0.25 Mg Tabs (Alprazolam) .... Take 1 tab by mouth once to twice a day as needed 2)  Levothyroxine Sodium 88 Mcg Tabs (Levothyroxine sodium) .[name].. 1 by mouth daily 3)  Pristiq 50 Mg Xr24h-tab (Desvenlafaxine succinate) .... Once daily 4)  Crestor 20 Mg Tabs (Rosuvastatin calcium) .[name].. 1 tablet by mouth daily 5)  Mometasone Furoate 0.1 % Oint (Mometasone furoate) .... Use two times a day for rash 6)  Hydroxyzine Hcl 25 Mg Tabs (Hydroxyzine hcl) .[name].. 1-2 tabs by mouth two times a day as needed itching or for insomnia 7)  Vitamin D3 1000 Unit Tabs (Cholecalciferol) .... 2 by mouth daily 8)  Aspirin 81 Mg Tbec (Aspirin) .... One by mouth every day 9)  Vitamin B-12 Cr 1000 Mcg Tbcr (Cyanocobalamin) .... Take one tablet by mouth daily 10)  Loratadine 10 Mg Tabs (Loratadine) .[name].. 1 by mouth once daily as needed allergies  Patient Instructions:  1)  Call if problems   Orders Added: 1)  Est. Patient Level III [11914]

## 2010-03-15 NOTE — Assessment & Plan Note (Signed)
Summary: 2-3 MO ROV /NWS #   Vital Signs:  Patient profile:   70 year old [name] Height:      65 inches Weight:      143 pounds BMI:     23.88 O2 Sat:      96 % on Room air Temp:     97.0 degrees F oral Pulse rate:   95 / minute BP sitting:   144 / 90  (left arm) Cuff size:   regular  Vitals Entered By: Alysia Penna (November 08, 2009 2:32 PM)  O2 Flow:  Room air CC: pt c/o not feeling right. having some pain durning BM. usually subsides once has passed movement. /cp sma Comments Pt stated that the Loratadine didn't help.    Primary Care Provider:  Plotnikov  CC:  pt c/o not feeling right. having some pain durning BM. usually subsides once has passed movement. /cp sma.  History of Present Illness: C/o RLQ pain x 2 wks, fullness; cramps worse w/BMs. Had sweats last night. F/u B12 def, anxiety, dyslipidemia. C/o insomnia.  Current Medications (verified): 1)  Alprazolam 0.25 Mg Tabs (Alprazolam) .... Take 1 Tab By Mouth Once To Twice A Day As Needed 2)  Levothyroxine Sodium 88 Mcg  Tabs (Levothyroxine Sodium) .[name].. 1 By Mouth Daily 3)  Pristiq 50 Mg Xr24h-Tab (Desvenlafaxine Succinate) .... Once Daily 4)  Crestor 20 Mg Tabs (Rosuvastatin Calcium) .[name].. 1 Tablet By Mouth Daily 5)  Mometasone Furoate 0.1 % Oint (Mometasone Furoate) .... Use Two Times A Day For Rash 6)  Hydroxyzine Hcl 25 Mg Tabs (Hydroxyzine Hcl) .[name].. 1-2 Tabs By Mouth Two Times A Day As Needed Itching or For Insomnia 7)  Vitamin D3 1000 Unit  Tabs (Cholecalciferol) .... 2 By Mouth Daily 8)  Aspirin 81 Mg  Tbec (Aspirin) .... One By Mouth Every Day 9)  Vitamin B-12 Cr 1000 Mcg  Tbcr (Cyanocobalamin) .... Take One Tablet By Mouth Daily 10)  Loratadine 10 Mg Tabs (Loratadine) .[name].. 1 By Mouth Once Daily As Needed Allergies  Allergies (verified): 1)  ! Sulfacetamide Sodium-Sulfur (Sulfacetamide Sodium-Sulfur)  Past History:  Past Medical History: Last updated: 03/13/2009 Thyroid Ca, papillary stage 1  Dr  Everardo All 6/07: thyroidect 2 cm largest diameter (1 other tiny focus) 7/07:   i-131 rx 99 [facility] 10/07: thyroglogulin undetectable (ab neg) 2/08:   thyroglobulin undetectable (ab neg) 8/08:  thyroglobulin undetectable (ab neg) 1/09:   thyrogen body scan neg 3/09:   thyroglobulin undetectable (ab neg) 9/09:  thyroglobulin undetectable (ab neg) Hyperlipidemia Hypothyroidism Breast cancer R, hx of 2004, recurrent in 2006 Dr Darnelle Catalan GI Dr Kinnie Scales Colon 2009 Vit B12 def 2009 Vit D def 2009 Anxiety Depression Dr Evelene Croon Allergic rhinitis Gyn Dr Salvadore Dom Psoriasis  Social History: Last updated: 06/17/2008 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   Review of Systems  The patient denies fever and dyspnea on exertion.    Physical Exam  General:  NAD Nose:  no external deformity, nasal dischargemucosal pallor, and mucosal edema.   Mouth:  Oral mucosa and oropharynx without lesions or exudates.  Teeth in good repair. Neck:  No deformities, masses, or tenderness noted. Lungs:  totally clear to auscultation Heart:  rrr, no mrg Abdomen:  soft and tender in RLQ, no mass, bs+no hepatomegaly and no splenomegaly.   Msk:  No deformity or scoliosis noted of thoracic or lumbar spine.   Neurologic:  alert and oriented,  moves all 4. Skin:  normal texture and temp  not diaphoretic Patch of eczema on R lower shin  Psych:  Oriented X3 and slightly anxious. slightly anxious.     Impression & Recommendations:  Problem # 1:  RLQ PAIN (ICD-789.03) - poss diverticulitis vs appendicitis vs other Assessment New Cipro x 10 d See "Patient Instructions".  Orders: TLB-B12, Serum-Total ONLY (98119-J47) TLB-CBC Platelet - w/Differential (85025-CBCD) TLB-BMP (Basic Metabolic Panel-BMET) (80048-METABOL) TLB-Hepatic/Liver Function Pnl (80076-HEPATIC) TLB-Udip ONLY (81003-UDIP) Radiology Referral (Radiology) CT  Problem #  2:  B12 DEFICIENCY (ICD-266.2) Assessment: Comment Only  Orders: TLB-B12, Serum-Total ONLY (82956-O13) TLB-CBC Platelet - w/Differential (85025-CBCD) TLB-BMP (Basic Metabolic Panel-BMET) (80048-METABOL) TLB-Hepatic/Liver Function Pnl (80076-HEPATIC) TLB-Udip ONLY (81003-UDIP)  Problem # 3:  ANXIETY (ICD-300.00) Assessment: Deteriorated  Her updated medication list for this problem includes:    Alprazolam 0.25 Mg Tabs (Alprazolam) .[name]... Take 1 tab by mouth once to twice a day as needed    Pristiq 50 Mg Xr24h-tab (Desvenlafaxine succinate) ..... Once daily    Hydroxyzine Hcl 25 Mg Tabs (Hydroxyzine hcl) .[name]... 1-2 tabs by mouth two times a day as needed itching or for insomnia  Problem # 4:  DEPRESSION (ICD-311) - worried about cancer Assessment: Deteriorated  Orders: TLB-B12, Serum-Total ONLY (08657-Q46) TLB-CBC Platelet - w/Differential (85025-CBCD) TLB-BMP (Basic Metabolic Panel-BMET) (80048-METABOL) TLB-Hepatic/Liver Function Pnl (80076-HEPATIC) TLB-Udip ONLY (81003-UDIP)  Problem # 5:  FATIGUE (ICD-780.79) Assessment: Deteriorated Treat #1 Get labs  Complete Medication List: 1)  Alprazolam 0.25 Mg Tabs (Alprazolam) .... Take 1 tab by mouth once to twice a day as needed 2)  Levothyroxine Sodium 88 Mcg Tabs (Levothyroxine sodium) .[name].. 1 by mouth daily 3)  Pristiq 50 Mg Xr24h-tab (Desvenlafaxine succinate) .... Once daily 4)  Crestor 20 Mg Tabs (Rosuvastatin calcium) .[name].. 1 tablet by mouth daily 5)  Mometasone Furoate 0.1 % Oint (Mometasone furoate) .... Use two times a day for rash 6)  Hydroxyzine Hcl 25 Mg Tabs (Hydroxyzine hcl) .[name].. 1-2 tabs by mouth two times a day as needed itching or for insomnia 7)  Vitamin D3 1000 Unit Tabs (Cholecalciferol) .... 2 by mouth daily 8)  Aspirin 81 Mg Tbec (Aspirin) .... One by mouth every day 9)  Vitamin B-12 Cr 1000 Mcg Tbcr (Cyanocobalamin) .... Take one tablet by mouth daily 10)  Loratadine 10 Mg Tabs (Loratadine) .[name].. 1 by mouth  once daily as needed allergies 11)  Ciprofloxacin Hcl 500 Mg Tabs (Ciprofloxacin hcl) .[name].. 1 by mouth bid  Patient Instructions: 1)  Please schedule a follow-up appointment in 2 weeks. 2)  Low residue diet 10 d 3)  Call if you are not better in a reasonable amount of time or if worse. Go to ER if feeling really bad!  Prescriptions: CIPROFLOXACIN HCL 500 MG TABS (CIPROFLOXACIN HCL) 1 by mouth bid  #20 x 1   Entered and Authorized by:   Tresa Garter MD   Signed by:   Tresa Garter MD on 11/08/2009   Method used:   [facility] to        [facility]  [id number]       [address]       [place], [place]  [address]       Ph: [phone] or [phone]       Fax: [phone]   RxID:   [id number]

## 2010-03-15 NOTE — Assessment & Plan Note (Signed)
Summary: 4 MTH FU  STC   Vital Signs:  Patient profile:   70 year old female Height:      65 inches Weight:      144.25 pounds BMI:     24.09 O2 Sat:      97 % on Room air Temp:     97.2 degrees F oral Pulse rate:   81 / minute BP sitting:   120 / 70  (left arm) Cuff size:   regular  Vitals Entered By: Lucious Groves (Jul 11, 2009 2:39 PM)  O2 Flow:  Room air CC: C/O neck pain, dizziness, and room spinning when patient lays down at night. Patient questions inner ear vs. sinus infection./kb Is Patient Diabetic? No Pain Assessment Patient in pain? yes     Location: neck Intensity: 4 Onset of pain  2 weeks ago--Pt is seeing a Land. Comments Patient notes that she does cough and produces little mucous, but denies any fever./kb   Primary Care Provider:  Raphael Espe  CC:  C/O neck pain, dizziness, and and room spinning when patient lays down at night. Patient questions inner ear vs. sinus infection./kb.  History of Present Illness: C/o eczema - worse C/o vertigo C/o anxiety; stress is wors C/o insomnia x long time   Current Medications (verified): 1)  Vitamin D3 1000 Unit  Tabs (Cholecalciferol) .... 2 By Mouth Daily 2)  Alprazolam 0.25 Mg Tabs (Alprazolam) .... Take 1 Tab By Mouth Once To Twice A Day As Needed 3)  Aspirin 81 Mg  Tbec (Aspirin) .... One By Mouth Every Day 4)  Levothyroxine Sodium 88 Mcg  Tabs (Levothyroxine Sodium) .[name].. 1 By Mouth Daily 5)  Vitamin B-12 Cr 1000 Mcg  Tbcr (Cyanocobalamin) .... Take One Tablet By Mouth Daily 6)  Pristiq 50 Mg Xr24h-Tab (Desvenlafaxine Succinate) .... Once Daily 7)  Crestor 20 Mg Tabs (Rosuvastatin Calcium) .[name].. 1 Tablet By Mouth Daily  Allergies (verified): 1)  ! Sulfacetamide Sodium-Sulfur (Sulfacetamide Sodium-Sulfur)  Past History:  Past Medical History: Last updated: 03/13/2009 Thyroid Ca, papillary stage 1  Dr Everardo All 6/07: thyroidect 2 cm largest diameter (1 other tiny focus) 7/07:   i-131 rx [facility] 10/07:  thyroglogulin undetectable (ab neg) 2/08:   thyroglobulin undetectable (ab neg) 8/08:  thyroglobulin undetectable (ab neg) 1/09:   thyrogen body scan neg 3/09:   thyroglobulin undetectable (ab neg) 9/09:  thyroglobulin undetectable (ab neg) Hyperlipidemia Hypothyroidism Breast cancer R, hx of 2004, recurrent in 2006 Dr Darnelle Catalan GI Dr Kinnie Scales Colon 2009 Vit B12 def 2009 Vit D def 2009 Anxiety Depression Dr Evelene Croon Allergic rhinitis Gyn Dr Salvadore Dom Psoriasis  Past Surgical History: Last updated: 08/19/2007 Thyroidectomy 2007 Lumpectomy Mastectomy R  Social History: Last updated: 06/17/2008 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   Review of Systems  The patient denies weight loss, weight gain, chest pain, dyspnea on exertion, and abdominal pain.    Physical Exam  General:  NAD Nose:  no external deformity, nasal dischargemucosal pallor, and mucosal edema.   Mouth:  Oral mucosa and oropharynx without lesions or exudates.  Teeth in good repair. Neck:  No deformities, masses, or tenderness noted. Lungs:  totally clear to auscultation Heart:  rrr, no mrg Abdomen:  soft and nontender, bs+ Msk:  No deformity or scoliosis noted of thoracic or lumbar spine.   Extremities:  no significant edema, pulses intact distally Neurologic:  alert and oriented, moves all 4. Psych:  Oriented X3 and slightly anxious.  Better   Impression & Recommendations:  Problem # 1:  ECZEMA (ICD-692.9) Assessment Deteriorated  The following medications were removed from the medication list:    Loratadine 10 Mg Tabs (Loratadine) ..... Once daily as needed allergies    Temovate 0.05 % Crea (Clobetasol propionate) ..... Use bid Her updated medication list for this problem includes:    Mometasone Furoate 0.1 % Oint (Mometasone furoate) ..... Use two times a day for rash    Loratadine 10 Mg Tabs (Loratadine) .[name]... 1  by mouth once daily as needed allergies  Orders: TLB-Hepatic/Liver Function Pnl (80076-HEPATIC) TLB-BMP (Basic Metabolic Panel-BMET) (80048-METABOL) TLB-B12, Serum-Total ONLY (16109-U04) T-Vitamin D (25-Hydroxy) [phone]) T-Allergy Profile Region II-DC, [place], [place], [place], [place] [phone]) T-Food Allergy Profile Specific IgE (86003/[address]) Depo- Medrol 80mg  (J1040) Admin of Therapeutic Inj  intramuscular or subcutaneous (56213)  Problem # 2:  B12 DEFICIENCY (ICD-266.2) Assessment: Comment Only  Orders: TLB-Hepatic/Liver Function Pnl (80076-HEPATIC) TLB-BMP (Basic Metabolic Panel-BMET) (80048-METABOL) TLB-B12, Serum-Total ONLY (08657-Q46) T-Vitamin D (25-Hydroxy) (96295-28413)  Problem # 3:  VERTIGO (ICD-780.4) - likely BPV Assessment: New  The following medications were removed from the medication list:    Loratadine 10 Mg Tabs (Loratadine) ..... Once daily as needed allergies Her updated medication list for this problem includes:    Loratadine 10 Mg Tabs (Loratadine) .[name]... 1 by mouth once daily as needed allergies Francee Piccolo - Daroff exercise was given to the patient   Problem # 4:  HYPOTHYROIDISM, POSTSURGICAL (ICD-244.0) Assessment: Comment Only  Her updated medication list for this problem includes:    Levothyroxine Sodium 88 Mcg Tabs (Levothyroxine sodium) .[name]... 1 by mouth daily  Complete Medication List: 1)  Alprazolam 0.25 Mg Tabs (Alprazolam) .... Take 1 tab by mouth once to twice a day as needed 2)  Levothyroxine Sodium 88 Mcg Tabs (Levothyroxine sodium) .[name].. 1 by mouth daily 3)  Pristiq 50 Mg Xr24h-tab (Desvenlafaxine succinate) .... Once daily 4)  Crestor 20 Mg Tabs (Rosuvastatin calcium) .[name].. 1 tablet by mouth daily 5)  Mometasone Furoate 0.1 % Oint (Mometasone furoate) .... Use two times a day for rash 6)  Hydroxyzine Hcl 25 Mg Tabs (Hydroxyzine hcl) .[name].. 1-2 tabs by mouth two times a day as needed itching or for insomnia 7)  Vitamin D3 1000 Unit Tabs (Cholecalciferol)  .... 2 by mouth daily 8)  Aspirin 81 Mg Tbec (Aspirin) .... One by mouth every day 9)  Vitamin B-12 Cr 1000 Mcg Tbcr (Cyanocobalamin) .... Take one tablet by mouth daily 10)  Loratadine 10 Mg Tabs (Loratadine) .[name].. 1 by mouth once daily as needed allergies  Patient Instructions: 1)  Please schedule a follow-up appointment in 2-3 months. 2)  Francee Piccolo - Daroff exercise to do for dizziness Prescriptions: LORATADINE 10 MG TABS (LORATADINE) 1 by mouth once daily as needed allergies  #30 x 6   Entered and Authorized by:   Tresa Garter MD   Signed by:   Tresa Garter MD on 07/11/2009   Method used:   [facility]        [facility]  [id number]       [address]       [place], [place]  [address]       Ph: [phone] or [phone]       Fax: [phone]   RxID:   [id number] HYDROXYZINE HCL 25 MG TABS (HYDROXYZINE HCL) 1-2 tabs by mouth two times a day as  needed itching or for insomnia  #60 x 3   Entered and Authorized by:   Tresa Garter MD   Signed by:   Tresa Garter MD on 07/11/2009   Method used:   [facility]  [id number]       [address]       [place], [place]  [address]       Ph: [phone] or [phone]       Fax: [phone]   RxID:   [id number] MOMETASONE FUROATE 0.1 % OINT (MOMETASONE FUROATE) use two times a day for rash  #60 g x 3   Entered and Authorized by:   Tresa Garter MD   Signed by:   Tresa Garter MD on 07/11/2009   Method used:   [id number]       [address]       [place], [place]  [address]       Ph: [phone] or [phone]       Fax: [phone]   RxID:   [id number]    Medication Administration  Injection # 1:    Medication: Depo- Medrol 80mg     Diagnosis: ECZEMA (ICD-692.9)    Route: IM    Site: RUOQ gluteus    Exp Date:  05/12/2012    Lot #: [id number]    Mfr: [employer]    Patient tolerated injection without complications  Injection # 2:    Medication: Depo- Medrol 40mg   Orders Added: 1)  TLB-Hepatic/Liver Function Pnl [80076-HEPATIC] 2)  TLB-BMP (Basic Metabolic Panel-BMET) [80048-METABOL] 3)  TLB-B12, Serum-Total ONLY [82607-B12] 4)  T-Vitamin D (25-Hydroxy) [37628-31517] 5)  T-Allergy Profile Region II-DC, [place], [place], [place], [place] [6160] 6)  T-Food Allergy Profile Specific IgE [86003/[address]] 7)  Est. Patient Level IV [73710] 8)  Depo- Medrol 80mg  [J1040] 9)  Admin of Therapeutic Inj  intramuscular or subcutaneous [62694]

## 2010-04-02 ENCOUNTER — Ambulatory Visit
Admission: RE | Admit: 2010-04-02 | Discharge: 2010-04-02 | Disposition: A | Discharge status: Home / Self Care | Payer: BC Managed Care – PPO | Source: Ambulatory Visit | Attending: Internal Medicine | Admitting: Internal Medicine

## 2010-04-02 DIAGNOSIS — Z1239 Encounter for other screening for malignant neoplasm of breast: Secondary | ICD-10-CM

## 2010-04-18 ENCOUNTER — Encounter: Payer: Self-pay | Admitting: Internal Medicine

## 2010-04-24 NOTE — Miscellaneous (Signed)
Summary: mammogram 2012  Clinical Lists Changes  Observations: Added new observation of MAMMOGRAM: normal (04/02/2010 9:16)      Preventive Care Screening  Mammogram:    Date:  04/02/2010    Results:  normal

## 2010-05-16 ENCOUNTER — Other Ambulatory Visit: Payer: Self-pay | Admitting: Internal Medicine

## 2010-05-25 ENCOUNTER — Ambulatory Visit (INDEPENDENT_AMBULATORY_CARE_PROVIDER_SITE_OTHER): Payer: BC Managed Care – PPO | Admitting: Internal Medicine

## 2010-05-25 ENCOUNTER — Other Ambulatory Visit (INDEPENDENT_AMBULATORY_CARE_PROVIDER_SITE_OTHER): Payer: BC Managed Care – PPO

## 2010-05-25 ENCOUNTER — Other Ambulatory Visit (INDEPENDENT_AMBULATORY_CARE_PROVIDER_SITE_OTHER): Payer: BC Managed Care – PPO | Admitting: Internal Medicine

## 2010-05-25 ENCOUNTER — Encounter: Payer: Self-pay | Admitting: Internal Medicine

## 2010-05-25 DIAGNOSIS — E538 Deficiency of other specified B group vitamins: Secondary | ICD-10-CM

## 2010-05-25 DIAGNOSIS — E785 Hyperlipidemia, unspecified: Secondary | ICD-10-CM

## 2010-05-25 DIAGNOSIS — R5381 Other malaise: Secondary | ICD-10-CM

## 2010-05-25 DIAGNOSIS — E559 Vitamin D deficiency, unspecified: Secondary | ICD-10-CM

## 2010-05-25 DIAGNOSIS — L570 Actinic keratosis: Secondary | ICD-10-CM | POA: Insufficient documentation

## 2010-05-25 DIAGNOSIS — R5383 Other fatigue: Secondary | ICD-10-CM

## 2010-05-25 LAB — CBC WITH DIFFERENTIAL/PLATELET
Eosinophils Absolute: 0.1 10*3/uL (ref 0.0–0.7)
MCHC: 34.4 g/dL (ref 30.0–36.0)
MCV: 93.4 fl (ref 78.0–100.0)
Monocytes Absolute: 0.3 10*3/uL (ref 0.1–1.0)
Neutrophils Relative %: 48 % (ref 43.0–77.0)
Platelets: 276 10*3/uL (ref 150.0–400.0)

## 2010-05-25 LAB — LIPID PANEL
Total CHOL/HDL Ratio: 7
Triglycerides: 247 mg/dL — ABNORMAL HIGH (ref 0.0–149.0)
VLDL: 49.4 mg/dL — ABNORMAL HIGH (ref 0.0–40.0)

## 2010-05-25 LAB — COMPREHENSIVE METABOLIC PANEL
AST: 22 U/L (ref 0–37)
Albumin: 4 g/dL (ref 3.5–5.2)
Alkaline Phosphatase: 101 U/L (ref 39–117)
BUN: 18 mg/dL (ref 6–23)
Potassium: 5 mEq/L (ref 3.5–5.1)
Sodium: 142 mEq/L (ref 135–145)
Total Protein: 7.4 g/dL (ref 6.0–8.3)

## 2010-05-25 LAB — LDL CHOLESTEROL, DIRECT: Direct LDL: 173.8 mg/dL

## 2010-05-25 MED ORDER — CYANOCOBALAMIN 1000 MCG/ML IJ SOLN
1000.0000 ug | Freq: Once | INTRAMUSCULAR | Status: AC
  Administered 2010-05-25: 1000 ug via INTRAMUSCULAR

## 2010-05-25 NOTE — Progress Notes (Signed)
  Subjective:    Patient ID: [name] [name], female    DOB: 1940-03-04, 70 y.o.   MRN: [id number]  HPI  The patient presents for a follow-up of  chronic hypertension, chronic dyslipidemia, type 2 diabetes controlled with medicines  C/o skin spot on L chest and forearms and hands C/o eczema L leg  She stopped her BP med - it was low  Review of Systems  Constitutional: Positive for fatigue. Negative for diaphoresis and activity change.  HENT: Negative for hearing loss, sneezing, neck stiffness and tinnitus.   Eyes: Negative for discharge and visual disturbance.  Respiratory: Negative for cough, chest tightness, shortness of breath, wheezing and stridor.   Cardiovascular: Negative for chest pain and palpitations.  Gastrointestinal: Negative for abdominal distention, anal bleeding and rectal pain.  Genitourinary: Negative for dysuria and difficulty urinating.  Skin: Negative.        Spots that flake  Neurological: Negative for dizziness and speech difficulty.  Psychiatric/Behavioral: The patient is nervous/anxious (better).        Objective:   Physical Exam  Constitutional: She appears well-developed and well-nourished. No distress.  HENT:  Head: Normocephalic.  Right Ear: External ear normal.  Left Ear: External ear normal.  Nose: Nose normal.  Mouth/Throat: Oropharynx is clear and moist.  Eyes: Conjunctivae are normal. Pupils are equal, round, and reactive to light. Right eye exhibits no discharge. Left eye exhibits no discharge.  Neck: Normal range of motion. Neck supple. No JVD present. No tracheal deviation present. No thyromegaly present.  Cardiovascular: Normal rate, regular rhythm and normal heart sounds.   Pulmonary/Chest: No stridor. No respiratory distress. She has no wheezes.  Abdominal: Soft. Bowel sounds are normal. She exhibits no distension and no mass. There is no tenderness. There is no rebound and no guarding.  Musculoskeletal: She exhibits no edema and no  tenderness.  Lymphadenopathy:    She has no cervical adenopathy.  Neurological: She displays normal reflexes. No cranial nerve deficit. She exhibits normal muscle tone. Coordination normal.  Skin: Rash (AKs) noted. No erythema.  Psychiatric: She has a normal mood and affect. Her behavior is normal. Judgment and thought content normal.          Assessment & Plan:   Actinic keratoses Multiple   Procedure Note :    Procedure : Cryosurgery   Indication:  Actinic keratosis(es)   Risks including unsuccessful procedure , bleeding, infection, bruising, scar, a need for a repeat  procedure and others were explained to the patient in detail as well as the benefits. Informed consent was obtained verbally.    8 lesion(s)  on chest, forearms and hands  were treated with liquid nitrogen on a Q-tip in a usual fasion . Band-Aid was applied and antibiotic ointment was given for a later use.   Tolerated well. Complications none.   Postprocedure instructions :     Keep the wounds clean. You can wash them with liquid soap and water. Pat dry with gauze or a Kleenex tissue  Before applying antibiotic ointment and a Band-Aid.   You need to report immediately  if  any signs of infection develop.     VITAMIN D DEFICIENCY Recheck labs  B12 DEFICIENCY Cont Rx  FATIGUE Better

## 2010-05-25 NOTE — Assessment & Plan Note (Signed)
Cont Rx 

## 2010-05-25 NOTE — Assessment & Plan Note (Signed)
Recheck labs 

## 2010-05-25 NOTE — Assessment & Plan Note (Addendum)
Multiple  Procedure Note :     Procedure : Cryosurgery   Indication:  Wart(s)  Actinic keratosis(es)   Risks including unsuccessful procedure , bleeding, infection, bruising, scar, a need for a repeat  procedure and others were explained to the patient in detail as well as the benefits. Informed consent was obtained verbally.     lesion(s)  on    was/were treated with liquid nitrogen on a Q-tip in a usual fasion . Band-Aid was applied and antibiotic ointment was given for a later use.   Tolerated well. Complications none.   Postprocedure instructions :     Keep the wounds clean. You can wash them with liquid soap and water. Pat dry with gauze or a Kleenex tissue  Before applying antibiotic ointment and a Band-Aid.   You need to report immediately  if  any signs of infection develop.

## 2010-05-25 NOTE — Assessment & Plan Note (Signed)
Better  

## 2010-05-28 ENCOUNTER — Telehealth: Payer: Self-pay | Admitting: Internal Medicine

## 2010-05-28 NOTE — Telephone Encounter (Signed)
[name] [name] , please, inform the patient:  the labs are normal, except for low B12 and low Vit D Restart Vit B12 Restart vit D 50 000 iu q 6 wks, then 1000 iu qd   Please, keep  next office visit appointment.   Thank you !

## 2010-05-29 MED ORDER — VITAMIN D3 1.25 MG (50000 UT) PO CAPS: 1.0000 | ORAL_CAPSULE | ORAL | Status: DC

## 2010-05-29 NOTE — Telephone Encounter (Signed)
Left mess for patient to call back.  

## 2010-05-29 NOTE — Telephone Encounter (Signed)
Pt informed

## 2010-06-29 NOTE — Op Note (Signed)
NAMEALEISHA, PAONE                         ACCOUNT NO.:  [id number]   MEDICAL RECORD NO.:  [id number]                   PATIENT TYPE:  INP   LOCATION:  5725                                 FACILITY:  [facility]   PHYSICIAN:  Rose Phi. Maple Hudson, M.D.                DATE OF BIRTH:  27-Sep-1940   DATE OF PROCEDURE:  06/08/2002  DATE OF DISCHARGE:                                 OPERATIVE REPORT   PREOPERATIVE DIAGNOSIS:  Recurrent carcinoma of the right breast.   POSTOPERATIVE DIAGNOSIS:  Recurrent carcinoma of the right breast.   PROCEDURE:  Right modified radical mastectomy.   SURGEON:  Rose Phi. Maple Hudson, M.D.   ASSISTANT:  Anselm Pancoast. Zachery Dakins, M.D.   ANESTHESIA:  General.   INDICATIONS:  This 70 year old married female in 2002 had a carcinoma of the  right breast that was T1C N0 M0 receptor negative tumor.  She was treated  with CNF therapy and radiation therapy.  Recently she palpated a new area of  in the upper outer quadrant of her right breast between the lumpectomy site  and the sentinel node biopsy site and core biopsy showed invasive mammary  cancer.  She is scheduled for a mastectomy.  She wants to delay  reconstruction.   DESCRIPTION OF PROCEDURE:  After a suitable general endotracheal anesthesia  was induced, the patient was placed in the supine position with both arms  extended on arm boards.  The right breast, axilla and shoulder were then  prepped and draped in the usual fashion.  A transverse elliptical incision  was then outline incorporating the nipple areolar complex as well as the  previous lumpectomy site in the upper outer quadrant.  The incisions were  made and then the flaps dissected superiorly into the clavicle and medially  to the sternum and inferiorly to the rectus fascia and laterally to the  latissimus dorsi muscle.  We then removed the breast from the chest wall by  dissecting from medial to lateral dissecting the breast from the pectoralis  fascia.   At the lateral margin of the pectoralis major muscle we incised  along that and retracted it and exposed the clavipectoral fascia and incised  along the pectoralis minor muscle exposing the axillary vein.  There were no  obvious metastases here.  With the vein exposed and the pectoralis minor  retracted we took all of the tissue inferior to the vein and from beneath  the pectoralis minor did a level 1 and level 2 node dissection.  The long  thoracic thoracodorsal nerves were identified and preserved.  Other vessels  and nerves were clipped and divided.  Following removal of the breast, we  thoroughly irrigated with saline.  We had good hemostasis.  Two 19 French  Blake drains were inserted, one into the axilla and up over the chest wall.  The skin was stapled.  Dressings were applied.  The patient  was transferred  to the recovery room in satisfactory condition having tolerated the  procedure well.                                               Rose Phi. Maple Hudson, M.D.    PRY/MEDQ  D:  06/08/2002  T:  06/08/2002  Job:  [id number]

## 2010-06-29 NOTE — Op Note (Signed)
[name], [name]               ACCOUNT NO.:  [id number]   MEDICAL RECORD NO.:  [id number]          PATIENT TYPE:  INP   LOCATION:  2899                         FACILITY:  [facility]   PHYSICIAN:  Alfredia Ferguson, M.D.  DATE OF BIRTH:  [birth year]   DATE OF PROCEDURE:  01/11/2004  DATE OF DISCHARGE:                                 OPERATIVE REPORT   PREOPERATIVE DIAGNOSIS:  Acquired absence of right breast.  History of  breast cancer.   POSTOPERATIVE DIAGNOSIS:  Acquired absence of right breast.  History of  breast cancer.   PROCEDURE:  1.  Removal of tissue expander from right subpectoral space.  2.  Delayed breast reconstruction with right ipsilateral transverse rectus      abdominis myocutaneous flap.   SURGEON:  Alfredia Ferguson, M.D.   ANESTHESIA:  General endotracheal anesthesia.   ASSISTANT:  Magnus Ivan, RNFA   INDICATIONS FOR PROCEDURE:  This is a 70 year old woman who desires elective  delayed breast reconstruction.  Breast reconstruction initially consisted of  placement of a tissue expander.  Unfortunately, the tissue expander did not  expand in a desirable fashion due to the history of radiation therapy.  She  would like to have this converted to a TRAM flap.  The patient had  previously undergone a vascular delay procedure of her TRAM in order to  maximize circulation to the skin paddle.  The patient understands the risks  of this surgery including potential loss of the flap due to vascular  compromise, fat necrosis, asymmetry, bleeding, infection, hematoma, seroma,  abdominal hernias, abdominal wound healing problems, vascular compromise of  the umbilicus, numbness of the skin of the abdomen, protracted pain, and  overall dissatisfaction with the results.  Inspite of these and other risks,  the patient wishes to proceed with the surgery.   DESCRIPTION OF PROCEDURE:  Skin marks had been placed outlining the desired  dimensions of the new breast along the right  chest.  The dimensions of the  skin paddle were also marked.  The patient was taken to the operating room  where she was given general endotracheal anesthesia.  The chest and abdomen  were prepped with Betadine and draped with sterile drapes.  An incision was  made through the previous mastectomy incision in order to gain access to the  tissue expander.  The tissue expander was removed without difficulty.  A  capsulotomy was performed with special attention to the lateral area of the  pocket in order to expand the pocket into the axillary region somewhat due  to the significant amount of hollowness in the axilla.  After the  capsulotomy had been performed, a 10 mm Blake drain was placed in the  lateral axillary gutter and brought out through a separate stab incision.  A  circular incision was made around the umbilicus and the umbilicus was  dissected away from the surrounding skin paddle.  The upper portion of the  skin paddle incision was made and an abdominal flap was elevated up to the  costal margins bilaterally and the xiphoid in the midline.  A  tunnel was  continued in the inferior medial aspect of the breast defect until  connecting to the breast dissection.  The lower portion of my skin paddle  incision was made which was through the original scar from the vascular  delay.  This incision was deepened until reaching the anterior abdominal  wall fascia.  The left corner of the skin paddle was elevated off the  abdominal fascia until crossing the midline 1 cm to the right of the linea  alba.  The right corner of the skin paddle was elevated until reaching the  lateral row of the rectus perforators which was approximately 3 cm medial to  the lateral border of the rectus fascia.  Two parallel incisions were made  in the anterior rectus fascia beginning at the costal margin superiorly and  carrying these two parallel incisions separated by about 2 cm down until  reaching the connection of  the skin paddle to the rectus fascia.  The medial  incision continued inferiorly skirting along the medial connection of the  skin paddle to the anterior rectus fascia and then turning laterally once I  reached the inferior connection of the skin paddle.  The lateral rectus  fascia incision continued inferiorly skirting along the lateral connection  of the skin paddle until connecting with the previous rectus fascia  incision.  The medial and lateral rectus fascial flaps were dissected off  the rectus muscle using electrocautery.  The deep surface of the rectus  muscle was dissected up off the posterior rectus fascia.  The lateral  perforators were clipped between Hemoclips and divided.  The deep inferior  epigastric vessels had previously been divided.  For that reason, the rectus  muscle was simply divided just below the arcuate line.  I went ahead and  removed zone 4 of the skin paddle which was the left lateral corner and  approximately 12 cm of the left side of the skin paddle.  I went ahead and  removed the very corner of the zone 2 just to ensure adequate bleeding.  There was excellent bleeding coming from the cut edge.  The skin paddle was  now tunneled along with the connected muscle through the connection between  the abdominal dissection and the breast and the flap was left in the pocket  left from the tissue expander.  The skin was temporarily stapled over the  skin paddle, but I felt initially that this skin was extremely tight and I  was going to have to leave some of the skin of the skin paddle exposed.  Attention was redirected to the abdomen which was copiously irrigated with  warm saline irrigation.  Hemostasis was meticulously accomplished.  The  rectus fascia was now closed with multiple interrupted buried figure-of-  eight 0 Prolene sutures beginning inferiorly and carrying these interrupted sutures all the way to just below the turnover point of the rectus muscle.   Inferiorly, the rectus fascia was very tenuous and tore easily inspite of  minimal tension.  For this reason, I opted to place Marlex mesh over the  entire closure.  This was placed as an onlay graft, approximately 6 to 7 cm  in width.  This mesh was fixed in position using 2-0 Prolene sutures along  the periphery of the mesh uniting it to the anterior abdominal wall fascia.  An opening was made in the midportion of the mesh to allow the umbilicus to  come through.  A 10 mm Blake drain was placed  in the inferior portion of the  abdominal wound and brought out through a separate stab incision.  Two  catheters of an On-Q pain pump was placed and the catheters were placed in  the desired position of the abdominal wound.  The wound was once again  irrigated and inspected for hemostasis and once assured, the patient was  placed in a semi-Fowlers position with the back elevated to 30 degrees and  the knees flexed.  The abdominal wound was closed with multiple interrupted  2-0 Vicryl sutures for the dermis alternating with 3-0 Monocryl sutures for  the dermis followed by a running 3-0 Monocryl subcuticular for the skin  edges.  A new opening was made for the umbilicus and the umbilicus was  brought through this new opening and fixed in position with multiple  interrupted 3-0 Vicryl sutures for the dermis.  The staples were removed  from the mastectomy incision and it was noted that the flap was slightly  venous congested and I felt this was due to the pressure of the skin closure  over the skin paddle.  For that reason, I opted to leave approximately 5 to  6 cm width of abdominal skin paddle exposed.  The flap was marked for that  portion of the skin flap which would be exposed.  This mark was incised and  all of the remaining skin of the skin paddle was deepithelialized.  There  was excellent bleeding coming from the skin paddle.  The flap was placed  back in the desired position.  The inferior  cut edge of the skin paddle was  united to the inferior  breast flap with interrupted 3-0 Vicryl suture for  the dermis followed by a running 3-0 Monocryl subcuticular for the skin  edge.  The superior edge of the deepithelialized TRAM flap was now secured  to the superior limits of the dissection of the breast pocket with multiple  interrupted 3-0 Vicryl sutures to suspend the flap from the desired point.  The superior cut edge of the skin paddle was united to the superior breast  flap with a running 3-0 Monocryl subcuticular.  Symmetry was acceptable,  although, it is felt that the reconstructed breast is going to be slightly  smaller than the native breast.  Because the skin was so tight from the  history of radiation, it is unlikely that I could have gotten much more of a  skin paddle in the pocket.  The flap color was excellent with great  capillary refill at the conclusion of the procedure.  The reconstructed breast was cleansed and dried and Steri-Strips were applied to  the closed edges.  Steri-Strips were also applied to the abdominal incision.  Dressings were placed over the abdominal incision and a light dressing was  placed over the breast incision.  The patient was awakened, extubated, and  transported to the recovery room in satisfactory condition.  Estimated blood  loss was 350 mL.      Tiburcio Pea  D:  01/11/2004  T:  01/11/2004  Job:  [id number]

## 2010-06-29 NOTE — Op Note (Signed)
[place]  Patient:    JUDEA, FENNIMORE Visit Number: [id number] MRN: [id number]          Service Type: DSU Location: [facility] Attending Physician:  Janalyn Rouse Dictated by:   Rose Phi. Maple Hudson, M.D. Proc. Date: 10/27/00 Admit Date:  10/27/2000   CC:         Lafonda Mosses B. Thomasena Edis, M.D.   Operative Report  PREOPERATIVE DIAGNOSIS:  Carcinoma of the right breast.  POSTOPERATIVE DIAGNOSIS:  Carcinoma of the right breast.  OPERATION: 1. Blue dye injection. 2. Right partial mastectomy with needle localization and specimen mammography    and right axillary sentinel lymph node biopsy.  SURGEON:  Rose Phi. Maple Hudson, M.D.  ANESTHESIA:  General.  DESCRIPTION OF PROCEDURE:  The patient had presented with two lesions in her right breast that were adjacent to each other, both of which had invasive mammary carcinoma on biopsy.  We were going to plan to excise both of these as a partial mastectomy with wire localizations.  Prior to coming to the operating room, 1 millicurie of ______ was injected intradermally in the periareolar area.  After suitable general anesthesia was induced, the patient was placed in a supine position with the right breast exposed and the arm on the arm broad. Five cc of lymphazurin blue was injected in the subareolar tissue, and the breast massaged for 5 minutes.  We then prepped and draped the breast and the axilla.  I designed a curvilinear incision that incorporated both wires as well as the marked areas where the nonpalpable lesions were.  The curvilinear incision was then made, and with sharp dissection using a cautery, I widely excised both wires and the surrounding tissue.  Specimen was submitted for specimen mammography which confirmed the removal of both lesions.  Specimen then went to the pathologist for margins.  While that was being done we scanned the axilla, and there was one hot spot. A small transverse right  axillary incision was made with dissection through the subcutaneous tissue to the clavicle pectoral fascia.  We divided that, and then two blue and hot lymph nodes were removed as sentinel nodes.  They were submitted as sentinel nodes for touch preps.  While these were being done, the incisions were closed with 3-0 Vicryl and subcuticular 4-0 monocryls.  The sentinel nodes were reported as negative, as were the margins on the primary excision.  Steri-Strips were then placed on the incisions and dressings were applied, and the patient was transferred to the recovery room in satisfactory condition, having tolerated the procedure well. Dictated by:   Rose Phi. Maple Hudson, M.D. Attending Physician:  Janalyn Rouse DD:  10/27/00 TD:  10/27/00 Job: [id number]

## 2010-06-29 NOTE — Op Note (Signed)
[name] [name], DUMIRE                         ACCOUNT NO.:  [id number]   MEDICAL RECORD NO.:  [id number]                   PATIENT TYPE:  INP   LOCATION:  2550                                 FACILITY:  [facility]   PHYSICIAN:  Alfredia Ferguson, M.D.               DATE OF BIRTH:  02-Mar-1940   DATE OF PROCEDURE:  10/05/2003  DATE OF DISCHARGE:                                 OPERATIVE REPORT   PREOPERATIVE DIAGNOSES:  1. Acquired absence, right breast, secondary to breast cancer.  2. Left breast macromastia.  3. Small abdominal panniculus requiring vascular delay procedure to expand     volume that can be used for future transverse rectus abdominis     myocutaneous flap.   POSTOPERATIVE DIAGNOSES:  1. Acquired absence, right breast, secondary to breast cancer.  2. Left breast macromastia.  3. Small abdominal panniculus requiring vascular delay procedure to expand     volume that can be used for future transverse rectus abdominis     myocutaneous flap.   OPERATION PERFORMED:  1. Left breast reduction, removing 397 g of breast tissue.  2. Placement of right subpectoral tissue expander, Mentor, 450 mL.  3. Bilateral vascular delay procedure with ligation of bilateral deep     inferior epigastric artery and veins.   SURGEON:  Alfredia Ferguson, M.D.   ANESTHESIA:  General endotracheal anesthesia.   INDICATION FOR SURGERY:  This is a 70 year old woman who is status post  right mastectomy many years ago.  She was treated with radiation and  chemotherapy.  She has now decided to undergo breast reconstruction.  She  would like to undergo autologous reconstruction with a TRAM flap.  She has a  small abdominal panniculus.  ?For that reason I have opted to perform a  vascular delay procedure in order to maximize the amount of skin and fat  that I can use when the TRAM is done.  In addition, the patient would  benefit by having a TRAM which is buried beneath her skin flaps of the  mastectomy  site.  For this reason my plan is to place a tissue expander to  expand the breast flaps for future bearing of the TRAM flap.  The patient  has a large breast on the left side, which is approximately a D cup.  It  will be impossible to match the D cup breast on the right side, and for that  reason my plan is to reduce the left breast to approximately a C cup.  Potential risks of all this surgery, including over-reduction of the breast  on the left, under-reduction of the breast, infection, bleeding, hematoma,  seroma, unsightly scarring, were discussed with the patient.  The risk of  infection of the tissue expander on the right and chronic discomfort from  the abdominal incision was also discussed.  In spite of these and other  risks discussed with the patient,  she wishes to proceed with the surgery.   DESCRIPTION OF OPERATION:  Skin marks were placed for the TRAM skin paddle  and for the Wise skin pattern for her breast reduction on the day prior to  surgery.  The patient was taken to the OR today, where she was given general  endotracheal anesthesia.  The chest was prepped with Betadine, as was the  abdomen, and draped with sterile drapes.  The dimensions of the future  breast on the right side were marked on the chest wall.  Attention was first  directed to the left breast for breast reduction.  A 42 diameter circle was  drawn around the areola and an 8 cm wide inferiorly-based pedicle was marked  on the central aspect of the breast.  The areola mark was incised, as was  the pedicle mark.  The pedicle was de-epithelialized.  The remainder of my  breast marks were incised and the inferior-based pedicle was dissected away  from the surrounding breast tissue, cutting on the bias away from the  midclavicular line both laterally and medially.  Superiorly the upper  portion of the pedicle was dissected away from the breast tissue, cutting on  the bias in a superior direction, all to maintain  maximum connections to the  pectoralis fascia for vascular integrity.  The medial, superior, and lateral  breast flaps were now elevated off the pectoralis fascia.  The excess  dermoglandular tissue medially was dissected away from the medial breast  flap, thinning the medial breast flap to approximately 3 cm in thickness.  The excess tissue was dissected away from the superior breast flap in  continuity with the medial breast flap, thinning the superior breast flap to  approximately 4-5 cm in thickness.  The excess tissue was dissected away  from the lateral breast flap, thinning this flap to approximately 2-3 cm in  thickness.  Excess tissue was passed off for weight.  Approximately 397 g  was removed.  Hemostasis was meticulously maintained.  Closure was begun by  uniting the inferior corners of the vertical limb of the medial and lateral  breast flap to the midportion of the inframammary crease with a 2-0 Vicryl  suture.  Superior corner of the vertical limb of the medial and lateral  breast flap were united to each other with an interrupted 2-0 Vicryl suture.  The nipple-areolar complex was placed in its new location and fixed in  position with a combination of interrupted 2-0 Vicryl suture for the dermis  and 3-0 Monocryl interrupted for the dermis.  The inframammary crease  incision was closed with multiple interrupted 3-0 Monocryl sutures for the  dermis, as was the vertical limb of my incision.  A running 3-0 Monocryl  subcuticular was placed in all incisions.  Attention was now directed to the  right breast.  An incision was made through the original mastectomy incision  and deepened until visualizing the pectoralis fascia.  The inferior and  superior breast flaps were elevated off the pectoralis fascia.  The  pectoralis muscle was opened in the direction of its fibers, and a  subpectoral pocket was created using a combination of blunt and electrocautery dissection.  Once the  medial inferior fibers were dissected  off of the ribs, the pocket was expanded to the desired size.  Hemostasis  was meticulously maintained.  A 450 mL Mentor textured tissue expander was  prepared by evacuating the air and placing 100 mL of sterile saline for  injection.  The pocket was once again inspected for hemostasis and once  assured, it was copiously irrigated with warm saline irrigation.  Vicryl 3-0  sutures were placed in the incision through the pectoralis fibers but left  untied.  The tissue expander was placed in the desired position and the 3-0  Vicryl sutures were tied together.  I opted not to put a drain in the pocket  because there was almost no bleeding during the dissection.  The incision  was irrigated with saline irrigation and was closed with interrupted 3-0  Monocryl sutures.  This was followed by Steri-Strips to the skin edges.  Attention was directed to the skin marks in the TRAM markings.  The complete  inferior aspect of the skin paddle was incised, as was approximately 6-8 cm  of the upper marked dimensions of the skin paddle.  The dissection was  deepened until reaching the upper marked dimensions of the skin paddle.  The  dissection was deepened until reaching the anterior abdominal wall fascia.  On the right side at the border of the external oblique fascia and the  rectus fascia a couple of centimeters above the inguinal ligament, a 2 cm  vertically-oriented incision was made in the fascia.  The lateral edge of  the rectus muscle was identified.  It was retracted in a medial direction  and blunt dissection, the deep inferior epigastric vessels were immediately  visualized.  These vessels were carefully dissected out of their anatomic  bed for a distance of about a centimeter.  The artery was first clipped  between multiple hemoclips and divided between multiple hemoclips.  The  venae comitantes were dissected, clipped, and divided in a similar fashion.  The  rectus fascia was now closed with interrupted 2-0 Prolene suture.  An  identical procedure was performed on the left inferior rectus muscle and  deep inferior epigastric vessels.  Closure was carried out in a similar  fashion.  The abdominal incision was copiously irrigated with saline  irrigation.  The incision was closed with multiple interrupted 3-0 Monocryl  sutures.  The skin incision was then stapled.  At the conclusion of the  procedure 1 mL of Restylane was injected into the upper and lower lip.  Restylane 1 mL was also shared between the upper portions of the bilateral  nasolabial folds.  The patient tolerated that procedure well.  Estimated  blood loss for the above procedure was less than 50 mL.  Dressings were  applied to the chest with a circumferential wrap with a six-inch Ace  bandage.  A light dressing was applied in the abdomen.  The patient was  awakened, extubated, and transported to the recovery room in satisfactory  condition.                                             Alfredia Ferguson, M.D.   WBB/MEDQ  D:  10/05/2003  T:  10/05/2003  Job:  [id number]

## 2010-06-29 NOTE — Discharge Summary (Signed)
NAMEJENNETTE, LEASK               ACCOUNT NO.:  [id number]   MEDICAL RECORD NO.:  [id number]          PATIENT TYPE:  INP   LOCATION:  5743                         FACILITY:  [facility]   PHYSICIAN:  Alfredia Ferguson, M.D.  DATE OF BIRTH:  10-Aug-1940   DATE OF ADMISSION:  01/11/2004  DATE OF DISCHARGE:  01/13/2004                                 DISCHARGE SUMMARY   ADMITTING DIAGNOSES:  1.  History right breast cancer.  2.  Acquired absence right breast.  3.  Mild depression.   DISCHARGE DIAGNOSES:  1.  History right breast cancer.  2.  Acquired absence right breast.  3.  Mild depression.   OPERATIONS PERFORMED:  Removal of right subpectoral tissue expander and  replacement with a transverse rectus abdominis myocutaneous flap breast  reconstruction.   DATE OF SURGERY:  January 11, 2004   CHIEF COMPLAINT:  I had breast cancer and I am having breast  reconstruction.   HISTORY OF PRESENT ILLNESS:  This is a 70 year old woman who had mastectomy  in April of 2004 for breast cancer.  She was treated with radiation therapy  and chemotherapy.  The patient has subsequently decided to have breast  reconstruction.  She wishes to undergo a TRAM flap.  My plan was to expand  the native skin so that I could bury the TRAM.  The patient is admitted to  the hospital at this time for delayed breast reconstruction with the TRAM  flap.  She has previously had placement of a tissue which is now fully  inflated.   PAST MEDICAL HISTORY:  1.  Mild depression.  2.  She also complains of some TMJ problem on the right side which she had      operated on.   PAST SURGICAL HISTORY:  1.  Mastectomy in April 2004.  2.  TMJ surgery in 1994.  3.  Septoplasty in 1989 and 1997.  4.  Left breast reduction in 2005.   ALLERGIES:  CODEINE, SULFA, HYPAFIX TAPE, HORMONE PATCHES.   MEDICATIONS:  1.  Zoloft 200 mg daily.  2.  Meclizine 25 mg as needed.  3.  Xanax 0.5 mg b.i.d.  4.  Actonel q.week.   ADMISSION LABORATORIES:  CBC which was normal.  Electrolytes were normal.  Urinalysis was normal.  Chest x-ray and cardiogram were not on the chart at  time of discharge.   HOSPITAL COURSE:  On the day of admission the patient was taken to the OR  where she underwent removal of her right subpectoral tissue expander and  placement of a right TRAM flap.  Unfortunately, because of the radiation the  radiated skin could not accommodate the TRAM flap without being overly tight  and for that reason I had to expose some of the abdominal skin at the  reconstructive site.  Postoperative course has been completely uneventful.  Patient was ambulating first postoperative day.  She was tolerating regular  food on the evening of surgery.  Her TRAM flap was healthy.  It is slightly  smaller than her opposite side.  Her abdominal wound is healing well.  Patient is being discharged on the second postoperative day.   DISCHARGE MEDICATIONS:  1.  Keflex.  2.  Vicodin.   DISCHARGE INSTRUCTIONS:  She has been provided with discharge instructions.   FOLLOWUP:  Will be provided in five days in my office.      Tiburcio Pea  D:  01/13/2004  T:  01/13/2004  Job:  [id number]

## 2010-07-23 ENCOUNTER — Telehealth: Payer: Self-pay | Admitting: *Deleted

## 2010-07-23 DIAGNOSIS — R21 Rash and other nonspecific skin eruption: Secondary | ICD-10-CM

## 2010-07-23 NOTE — Telephone Encounter (Signed)
Patient requesting referral to dermatologist. 1 - for area on back that is very itchy. ? Mole. And 2 - for no improvement in eczema.

## 2010-07-23 NOTE — Telephone Encounter (Signed)
Ok Dr Jorja Loa Dx eczema, moles Thx

## 2010-07-23 NOTE — Telephone Encounter (Signed)
Patient informed. 

## 2010-08-24 ENCOUNTER — Ambulatory Visit: Payer: BC Managed Care – PPO | Admitting: Internal Medicine

## 2010-08-27 ENCOUNTER — Ambulatory Visit: Payer: BC Managed Care – PPO | Admitting: Internal Medicine

## 2010-10-16 ENCOUNTER — Encounter (HOSPITAL_BASED_OUTPATIENT_CLINIC_OR_DEPARTMENT_OTHER): Payer: BC Managed Care – PPO | Admitting: Oncology

## 2010-10-16 ENCOUNTER — Other Ambulatory Visit: Payer: Self-pay | Admitting: Oncology

## 2010-10-16 DIAGNOSIS — Z853 Personal history of malignant neoplasm of breast: Secondary | ICD-10-CM

## 2010-10-16 DIAGNOSIS — Z09 Encounter for follow-up examination after completed treatment for conditions other than malignant neoplasm: Secondary | ICD-10-CM

## 2010-10-16 LAB — CBC WITH DIFFERENTIAL/PLATELET
BASO%: 0.4 % (ref 0.0–2.0)
Basophils Absolute: 0 10*3/uL (ref 0.0–0.1)
EOS%: 2.1 % (ref 0.0–7.0)
Eosinophils Absolute: 0.1 10*3/uL (ref 0.0–0.5)
HCT: 41.1 % (ref 34.8–46.6)
HGB: 14.2 g/dL (ref 11.6–15.9)
LYMPH%: 37.3 % (ref 14.0–49.7)
MCH: 31.6 pg (ref 25.1–34.0)
MCHC: 34.7 g/dL (ref 31.5–36.0)
MCV: 91.1 fL (ref 79.5–101.0)
MONO#: 0.4 10*3/uL (ref 0.1–0.9)
MONO%: 9.3 % (ref 0.0–14.0)
NEUT#: 2.2 10*3/uL (ref 1.5–6.5)
NEUT%: 50.9 % (ref 38.4–76.8)
Platelets: 269 10*3/uL (ref 145–400)
RBC: 4.51 10*6/uL (ref 3.70–5.45)
RDW: 13.4 % (ref 11.2–14.5)
WBC: 4.3 10*3/uL (ref 3.9–10.3)
lymph#: 1.6 10*3/uL (ref 0.9–3.3)

## 2010-10-16 LAB — COMPREHENSIVE METABOLIC PANEL
ALT: 16 U/L (ref 0–35)
AST: 21 U/L (ref 0–37)
Albumin: 4.3 g/dL (ref 3.5–5.2)
Alkaline Phosphatase: 96 U/L (ref 39–117)
BUN: 14 mg/dL (ref 6–23)
CO2: 26 mEq/L (ref 19–32)
Calcium: 9.2 mg/dL (ref 8.4–10.5)
Chloride: 104 mEq/L (ref 96–112)
Creatinine, Ser: 0.95 mg/dL (ref 0.50–1.10)
Glucose, Bld: 121 mg/dL — ABNORMAL HIGH (ref 70–99)
Potassium: 4.4 mEq/L (ref 3.5–5.3)
Sodium: 140 mEq/L (ref 135–145)
Total Bilirubin: 0.6 mg/dL (ref 0.3–1.2)
Total Protein: 7.1 g/dL (ref 6.0–8.3)

## 2010-10-16 LAB — CANCER ANTIGEN 27.29: CA 27.29: 16 U/mL (ref 0–39)

## 2010-10-17 ENCOUNTER — Encounter (HOSPITAL_BASED_OUTPATIENT_CLINIC_OR_DEPARTMENT_OTHER): Payer: BC Managed Care – PPO | Admitting: Oncology

## 2010-10-17 DIAGNOSIS — Z853 Personal history of malignant neoplasm of breast: Secondary | ICD-10-CM

## 2010-10-17 DIAGNOSIS — Z09 Encounter for follow-up examination after completed treatment for conditions other than malignant neoplasm: Secondary | ICD-10-CM

## 2010-10-17 DIAGNOSIS — F411 Generalized anxiety disorder: Secondary | ICD-10-CM

## 2010-10-17 DIAGNOSIS — R1032 Left lower quadrant pain: Secondary | ICD-10-CM

## 2010-10-18 ENCOUNTER — Other Ambulatory Visit: Payer: Self-pay | Admitting: Oncology

## 2010-10-18 DIAGNOSIS — Z1231 Encounter for screening mammogram for malignant neoplasm of breast: Secondary | ICD-10-CM

## 2010-10-18 DIAGNOSIS — Z9011 Acquired absence of right breast and nipple: Secondary | ICD-10-CM

## 2010-10-22 ENCOUNTER — Telehealth: Payer: Self-pay

## 2010-10-22 NOTE — Telephone Encounter (Signed)
[facility] Triage Call Report Triage Record Num: [id number] Operator: Migdalia Dk Patient Name: [name] [name] Call Date & Time: 10/21/2010 1:44:02AM Patient Phone: [phone] PCP: Sonda Primes Patient Gender: Female PCP Fax : [phone] Patient DOB: December 23, 1940 Practice Name: [facility] Reason for Call: Pt. states when getting up to move around has ringing in ears and "my head starts to hurt between my eyes." BP 173/90 now, pulse 66. "As long as I am laying down I am fine." States has some tenderness over sinus area and eyebrows. Homecare advice given per Hypertension, Diagnosed Guideline, pt. to go to UC in a.m. for evaluation. Protocol(s) Used: Hypertension, Diagnosed or Suspected Recommended Outcome per Protocol: See Provider within 72 Hours Reason for Outcome: Multiple elevated blood pressure readings without other symptoms AND no previous work-up OR readings exceed expected range defined by treatment plan Care Advice: Call EMS 911 if new symptoms develop, such as severe shortness of breath, chest pain, change in mental status, acute neurologic deficit, seizure, visual disturbances, pulse rate > 120 / minute, or very irregular pulse. ~ ~ Call provider if systolic BP is 180 or greater, or if diastolic BP is 120 or greater. Medication Advice: - Discontinue all nonprescription and alternative medications, especially stimulants, until evaluated by provider. - Take prescribed medications as directed, following label instructions for the medication. - Do not change medications or dosing regimen until provider is consulted. - Know possible side effects of medication and what to do if they occur. - Tell provider all prescription, nonprescription or alternative medications that you take ~ 10/21/2010 2:01:02AM Page 1 of 1 [email or web address]

## 2010-10-30 ENCOUNTER — Other Ambulatory Visit (INDEPENDENT_AMBULATORY_CARE_PROVIDER_SITE_OTHER): Payer: BC Managed Care – PPO

## 2010-10-30 ENCOUNTER — Other Ambulatory Visit: Payer: Self-pay | Admitting: *Deleted

## 2010-10-30 ENCOUNTER — Ambulatory Visit (INDEPENDENT_AMBULATORY_CARE_PROVIDER_SITE_OTHER): Payer: BC Managed Care – PPO | Admitting: Internal Medicine

## 2010-10-30 ENCOUNTER — Encounter: Payer: Self-pay | Admitting: Internal Medicine

## 2010-10-30 ENCOUNTER — Other Ambulatory Visit: Payer: Self-pay | Admitting: Internal Medicine

## 2010-10-30 DIAGNOSIS — I10 Essential (primary) hypertension: Secondary | ICD-10-CM

## 2010-10-30 DIAGNOSIS — E559 Vitamin D deficiency, unspecified: Secondary | ICD-10-CM

## 2010-10-30 DIAGNOSIS — F411 Generalized anxiety disorder: Secondary | ICD-10-CM

## 2010-10-30 DIAGNOSIS — E890 Postprocedural hypothyroidism: Secondary | ICD-10-CM

## 2010-10-30 DIAGNOSIS — R5381 Other malaise: Secondary | ICD-10-CM

## 2010-10-30 DIAGNOSIS — J0100 Acute maxillary sinusitis, unspecified: Secondary | ICD-10-CM

## 2010-10-30 DIAGNOSIS — R5383 Other fatigue: Secondary | ICD-10-CM

## 2010-10-30 DIAGNOSIS — E785 Hyperlipidemia, unspecified: Secondary | ICD-10-CM

## 2010-10-30 DIAGNOSIS — E538 Deficiency of other specified B group vitamins: Secondary | ICD-10-CM

## 2010-10-30 LAB — COMPREHENSIVE METABOLIC PANEL
AST: 29 U/L (ref 0–37)
Albumin: 4.2 g/dL (ref 3.5–5.2)
BUN: 13 mg/dL (ref 6–23)
Calcium: 9.1 mg/dL (ref 8.4–10.5)
Chloride: 105 mEq/L (ref 96–112)
Creatinine, Ser: 1 mg/dL (ref 0.4–1.2)
GFR: 57.99 mL/min — ABNORMAL LOW (ref >60.00)
Glucose, Bld: 157 mg/dL — ABNORMAL HIGH (ref 70–99)
Potassium: 4.3 mEq/L (ref 3.5–5.1)

## 2010-10-30 LAB — CBC WITH DIFFERENTIAL/PLATELET
Basophils Relative: 0.5 % (ref 0.0–3.0)
Eosinophils Absolute: 0.1 10*3/uL (ref 0.0–0.7)
Eosinophils Relative: 2.1 % (ref 0.0–5.0)
Hemoglobin: 14.3 g/dL (ref 12.0–15.0)
Lymphocytes Relative: 36.8 % (ref 12.0–46.0)
MCHC: 33.8 g/dL (ref 30.0–36.0)
MCV: 92.4 fl (ref 78.0–100.0)
Neutro Abs: 2.2 10*3/uL (ref 1.4–7.7)
Neutrophils Relative %: 55 % (ref 43.0–77.0)
RBC: 4.58 Mil/uL (ref 3.87–5.11)
WBC: 4.1 10*3/uL — ABNORMAL LOW (ref 4.5–10.5)

## 2010-10-30 LAB — LIPID PANEL
Cholesterol: 270 mg/dL — ABNORMAL HIGH (ref 0–200)
Triglycerides: 263 mg/dL — ABNORMAL HIGH (ref 0.0–149.0)

## 2010-10-30 LAB — LDL CHOLESTEROL, DIRECT: Direct LDL: 173.3 mg/dL

## 2010-10-30 LAB — TSH: TSH: 1.59 u[IU]/mL (ref 0.35–5.50)

## 2010-10-30 MED ORDER — AMOXICILLIN 500 MG PO CAPS: 1000.0000 mg | ORAL_CAPSULE | Freq: Two times a day (BID) | ORAL | Status: DC

## 2010-10-30 NOTE — Progress Notes (Signed)
  Subjective:    Patient ID: [name] [name], female    DOB: 1940/11/07, 70 y.o.   MRN: [id number]  HPI  C/o BP elevated - HA, dizziness  She has not been taking BP med sporadically - would not remember to take it C/o sinus pressure and d/c Review of Systems  Constitutional: Positive for fever and fatigue. Negative for chills, activity change, appetite change and unexpected weight change.  HENT: Positive for congestion, rhinorrhea and postnasal drip. Negative for mouth sores and sinus pressure.   Eyes: Negative for visual disturbance.  Respiratory: Negative for cough and chest tightness.   Gastrointestinal: Negative for nausea and abdominal pain.  Genitourinary: Negative for frequency, difficulty urinating and vaginal pain.  Musculoskeletal: Negative for back pain and gait problem.  Skin: Negative for pallor and rash.  Neurological: Negative for dizziness, tremors, weakness, numbness and headaches.  Psychiatric/Behavioral: Negative for confusion and sleep disturbance.   BP Readings from Last 3 Encounters:  10/30/10 142/100  05/25/10 110/82  02/23/10 162/110        Objective:   Physical Exam  Constitutional: She appears well-developed and well-nourished. No distress.  HENT:  Head: Normocephalic.  Right Ear: External ear normal.  Left Ear: External ear normal.  Nose: Nose normal.  Mouth/Throat: Oropharynx is clear and moist.       eryth nasal mucosa  Eyes: Conjunctivae are normal. Pupils are equal, round, and reactive to light. Right eye exhibits no discharge. Left eye exhibits no discharge.  Neck: Normal range of motion. Neck supple. No JVD present. No tracheal deviation present. No thyromegaly present.  Cardiovascular: Normal rate, regular rhythm and normal heart sounds.   Pulmonary/Chest: No stridor. No respiratory distress. She has no wheezes.  Abdominal: Soft. Bowel sounds are normal. She exhibits no distension and no mass. There is no tenderness. There is no rebound and  no guarding.  Musculoskeletal: She exhibits no edema and no tenderness.  Lymphadenopathy:    She has no cervical adenopathy.  Neurological: She displays normal reflexes. No cranial nerve deficit. She exhibits normal muscle tone. Coordination normal.  Skin: No rash noted. No erythema.  Psychiatric: She has a normal mood and affect. Her behavior is normal. Judgment and thought content normal.          Assessment & Plan:

## 2010-10-30 NOTE — Assessment & Plan Note (Signed)
Amoxicillin x10d 

## 2010-10-30 NOTE — Assessment & Plan Note (Signed)
Take Cozaar qd

## 2010-10-30 NOTE — Assessment & Plan Note (Signed)
Continue with current prescription therapy as reflected on the Med list.  

## 2010-10-31 ENCOUNTER — Telehealth: Payer: Self-pay | Admitting: Internal Medicine

## 2010-10-31 MED ORDER — VITAMIN D3 1.25 MG (50000 UT) PO CAPS: 1.0000 | ORAL_CAPSULE | ORAL | Status: DC

## 2010-10-31 NOTE — Telephone Encounter (Signed)
[name] [name], please, inform patient that all labs are normal except for a very low Vit D and low Vit B12 Start Rx Vit D Come for B12 shot - OV w/me  Please, mail the labs to the patient.    Thx

## 2010-11-01 NOTE — Telephone Encounter (Signed)
Left mess for patient to call back.  

## 2010-11-02 NOTE — Telephone Encounter (Signed)
Pt informed/transferred to scheduler. Copies mailed to pt.

## 2010-11-06 ENCOUNTER — Ambulatory Visit (INDEPENDENT_AMBULATORY_CARE_PROVIDER_SITE_OTHER): Payer: BC Managed Care – PPO | Admitting: Internal Medicine

## 2010-11-06 ENCOUNTER — Encounter: Payer: Self-pay | Admitting: Internal Medicine

## 2010-11-06 VITALS — BP 142/80 | HR 80 | Temp 99.0°F | Resp 16 | Wt 145.0 lb

## 2010-11-06 DIAGNOSIS — E538 Deficiency of other specified B group vitamins: Secondary | ICD-10-CM

## 2010-11-06 DIAGNOSIS — I10 Essential (primary) hypertension: Secondary | ICD-10-CM

## 2010-11-06 DIAGNOSIS — R7309 Other abnormal glucose: Secondary | ICD-10-CM

## 2010-11-06 DIAGNOSIS — R739 Hyperglycemia, unspecified: Secondary | ICD-10-CM | POA: Insufficient documentation

## 2010-11-06 DIAGNOSIS — E559 Vitamin D deficiency, unspecified: Secondary | ICD-10-CM

## 2010-11-06 MED ORDER — CYANOCOBALAMIN 1000 MCG/ML IJ SOLN
1000.0000 ug | Freq: Once | INTRAMUSCULAR | Status: AC
  Administered 2010-11-06: 1000 ug via INTRAMUSCULAR

## 2010-11-06 MED ORDER — "SYRINGE/NEEDLE (DISP) 30G X 1/2"" 1 ML MISC": 1.0000 | Status: DC

## 2010-11-06 MED ORDER — CYANOCOBALAMIN 1000 MCG/ML IJ SOLN: 1000.0000 ug | INTRAMUSCULAR | Status: DC

## 2010-11-06 MED ORDER — VITAMIN D3 1.25 MG (50000 UT) PO CAPS: 1.0000 | ORAL_CAPSULE | ORAL | Status: DC

## 2010-11-06 NOTE — Assessment & Plan Note (Signed)
Re-start Rx Risks associated with treatment noncompliance were discussed. Compliance was encouraged.  

## 2010-11-06 NOTE — Assessment & Plan Note (Signed)
Worse Change Rx

## 2010-11-06 NOTE — Progress Notes (Signed)
  Subjective:    Patient ID: [name] [name], female    DOB: [birth year], 70 y.o.   MRN: [id number]  HPI  F/u fatigue, B12 and Vit D deficiency F/u hyperglycemia  Review of Systems  Constitutional: Positive for fatigue. Negative for chills, activity change, appetite change and unexpected weight change.  HENT: Negative for congestion, mouth sores and sinus pressure.   Eyes: Negative for visual disturbance.  Respiratory: Negative for cough and chest tightness.   Gastrointestinal: Negative for nausea and abdominal pain.  Genitourinary: Negative for frequency, difficulty urinating and vaginal pain.  Musculoskeletal: Positive for arthralgias. Negative for back pain and gait problem.  Skin: Negative for pallor and rash.  Neurological: Negative for dizziness, tremors, weakness, numbness and headaches.  Psychiatric/Behavioral: Negative for confusion and sleep disturbance.       Objective:   Physical Exam  Constitutional: She appears well-developed and well-nourished. No distress.  HENT:  Head: Normocephalic.  Right Ear: External ear normal.  Left Ear: External ear normal.  Nose: Nose normal.  Mouth/Throat: Oropharynx is clear and moist.  Eyes: Conjunctivae are normal. Pupils are equal, round, and reactive to light. Right eye exhibits no discharge. Left eye exhibits no discharge.  Neck: Normal range of motion. Neck supple. No JVD present. No tracheal deviation present. No thyromegaly present.  Cardiovascular: Normal rate, regular rhythm and normal heart sounds.   Pulmonary/Chest: No stridor. No respiratory distress. She has no wheezes.  Abdominal: Soft. Bowel sounds are normal. She exhibits no distension and no mass. There is no tenderness. There is no rebound and no guarding.  Musculoskeletal: She exhibits no edema and no tenderness.  Lymphadenopathy:    She has no cervical adenopathy.  Neurological: She displays normal reflexes. No cranial nerve deficit. She exhibits normal muscle tone.  Coordination normal.  Skin: No rash noted. No erythema.  Psychiatric: She has a normal mood and affect. Her behavior is normal. Judgment and thought content normal.          Assessment & Plan:

## 2010-11-06 NOTE — Assessment & Plan Note (Signed)
Labs Diet discussed 

## 2010-11-06 NOTE — Assessment & Plan Note (Signed)
Continue with current prescription therapy as reflected on the Med list.  

## 2010-11-08 ENCOUNTER — Other Ambulatory Visit: Payer: Self-pay | Admitting: Internal Medicine

## 2010-12-05 ENCOUNTER — Other Ambulatory Visit (INDEPENDENT_AMBULATORY_CARE_PROVIDER_SITE_OTHER): Payer: BC Managed Care – PPO

## 2010-12-05 DIAGNOSIS — E538 Deficiency of other specified B group vitamins: Secondary | ICD-10-CM

## 2010-12-05 DIAGNOSIS — R7309 Other abnormal glucose: Secondary | ICD-10-CM

## 2010-12-05 DIAGNOSIS — E559 Vitamin D deficiency, unspecified: Secondary | ICD-10-CM

## 2010-12-05 DIAGNOSIS — I10 Essential (primary) hypertension: Secondary | ICD-10-CM

## 2010-12-05 DIAGNOSIS — R739 Hyperglycemia, unspecified: Secondary | ICD-10-CM

## 2010-12-05 LAB — BASIC METABOLIC PANEL
CO2: 25 mEq/L (ref 19–32)
Calcium: 9.2 mg/dL (ref 8.4–10.5)
Creatinine, Ser: 1 mg/dL (ref 0.4–1.2)
GFR: 57.31 mL/min — ABNORMAL LOW (ref >60.00)
Glucose, Bld: 131 mg/dL — ABNORMAL HIGH (ref 70–99)

## 2010-12-06 LAB — VITAMIN D 25 HYDROXY (VIT D DEFICIENCY, FRACTURES): Vit D, 25-Hydroxy: 48 ng/mL (ref 30–89)

## 2010-12-31 ENCOUNTER — Other Ambulatory Visit: Payer: Self-pay | Admitting: *Deleted

## 2010-12-31 MED ORDER — ROSUVASTATIN CALCIUM 20 MG PO TABS: 20.0000 mg | ORAL_TABLET | Freq: Every day | ORAL | Status: DC

## 2011-01-07 ENCOUNTER — Ambulatory Visit: Payer: BC Managed Care – PPO | Admitting: Internal Medicine

## 2011-01-07 DIAGNOSIS — Z0289 Encounter for other administrative examinations: Secondary | ICD-10-CM

## 2011-01-09 ENCOUNTER — Ambulatory Visit (INDEPENDENT_AMBULATORY_CARE_PROVIDER_SITE_OTHER): Payer: BC Managed Care – PPO | Admitting: Internal Medicine

## 2011-01-09 ENCOUNTER — Encounter: Payer: Self-pay | Admitting: Internal Medicine

## 2011-01-09 DIAGNOSIS — R739 Hyperglycemia, unspecified: Secondary | ICD-10-CM

## 2011-01-09 DIAGNOSIS — R7309 Other abnormal glucose: Secondary | ICD-10-CM

## 2011-01-09 DIAGNOSIS — E559 Vitamin D deficiency, unspecified: Secondary | ICD-10-CM

## 2011-01-09 DIAGNOSIS — E538 Deficiency of other specified B group vitamins: Secondary | ICD-10-CM

## 2011-01-09 DIAGNOSIS — R5383 Other fatigue: Secondary | ICD-10-CM

## 2011-01-09 DIAGNOSIS — R5381 Other malaise: Secondary | ICD-10-CM

## 2011-01-09 DIAGNOSIS — I10 Essential (primary) hypertension: Secondary | ICD-10-CM

## 2011-01-09 NOTE — Assessment & Plan Note (Signed)
Continue with current prescription therapy as reflected on the Med list. Better 

## 2011-01-09 NOTE — Progress Notes (Signed)
  Subjective:    Patient ID: [name] [name], female    DOB: [birth year], 70 y.o.   MRN: [id number]  HPI  The patient presents for a follow-up of  chronic hypertension, chronic dyslipidemia, type 2 pre-diabetes controlled with medicines, fatigue; B12 and vit D def. Feeling better.    Review of Systems  Constitutional: Positive for fatigue. Negative for chills, activity change, appetite change and unexpected weight change.  HENT: Negative for congestion, mouth sores and sinus pressure.   Eyes: Negative for visual disturbance.  Respiratory: Negative for cough and chest tightness.   Gastrointestinal: Negative for nausea and abdominal pain.  Genitourinary: Negative for frequency, difficulty urinating and vaginal pain.  Musculoskeletal: Negative for back pain and gait problem.  Skin: Negative for pallor and rash.  Neurological: Negative for dizziness, tremors, weakness, numbness and headaches.  Psychiatric/Behavioral: Negative for confusion and sleep disturbance.       Objective:   Physical Exam  Constitutional: She appears well-developed and well-nourished. No distress.  HENT:  Head: Normocephalic.  Right Ear: External ear normal.  Left Ear: External ear normal.  Nose: Nose normal.  Mouth/Throat: Oropharynx is clear and moist.  Eyes: Conjunctivae are normal. Pupils are equal, round, and reactive to light. Right eye exhibits no discharge. Left eye exhibits no discharge.  Neck: Normal range of motion. Neck supple. No JVD present. No tracheal deviation present. No thyromegaly present.  Cardiovascular: Normal rate, regular rhythm and normal heart sounds.   Pulmonary/Chest: No stridor. No respiratory distress. She has no wheezes.  Abdominal: Soft. Bowel sounds are normal. She exhibits no distension and no mass. There is no tenderness. There is no rebound and no guarding.  Musculoskeletal: She exhibits no edema and no tenderness.  Lymphadenopathy:    She has no cervical adenopathy.    Neurological: She displays normal reflexes. No cranial nerve deficit. She exhibits normal muscle tone. Coordination normal.  Skin: No rash noted. No erythema.  Psychiatric: She has a normal mood and affect. Her behavior is normal. Judgment and thought content normal.    Lab Results  Component Value Date   WBC 4.1* 10/30/2010   HGB 14.3 10/30/2010   HCT 42.3 10/30/2010   PLT 263.0 10/30/2010   GLUCOSE 131* 12/05/2010   CHOL 270* 10/30/2010   TRIG 263.0* 10/30/2010   HDL 39.10 10/30/2010   LDLDIRECT 173.3 10/30/2010   LDLCALC 122* 12/04/2007   ALT 17 10/30/2010   AST 29 10/30/2010   NA 141 12/05/2010   K 3.9 12/05/2010   CL 106 12/05/2010   CREATININE 1.0 12/05/2010   BUN 16 12/05/2010   CO2 25 12/05/2010   TSH 1.59 10/30/2010   HGBA1C 5.4 12/05/2010         Assessment & Plan:

## 2011-01-09 NOTE — Patient Instructions (Addendum)
Start taking a yoga class Nl BP<130/85

## 2011-01-09 NOTE — Assessment & Plan Note (Signed)
Better  

## 2011-01-09 NOTE — Assessment & Plan Note (Signed)
Chronic Risks associated with treatment noncompliance were discussed. Compliance was encouraged. States BP is nl at home

## 2011-01-09 NOTE — Assessment & Plan Note (Signed)
Watching  

## 2011-04-04 ENCOUNTER — Ambulatory Visit: Payer: BC Managed Care – PPO

## 2011-04-05 ENCOUNTER — Ambulatory Visit: Payer: BC Managed Care – PPO

## 2011-04-10 ENCOUNTER — Encounter: Payer: Self-pay | Admitting: Internal Medicine

## 2011-04-10 ENCOUNTER — Ambulatory Visit (INDEPENDENT_AMBULATORY_CARE_PROVIDER_SITE_OTHER): Payer: BC Managed Care – PPO | Admitting: Internal Medicine

## 2011-04-10 DIAGNOSIS — F329 Major depressive disorder, single episode, unspecified: Secondary | ICD-10-CM

## 2011-04-10 DIAGNOSIS — N63 Unspecified lump in unspecified breast: Secondary | ICD-10-CM

## 2011-04-10 DIAGNOSIS — F3289 Other specified depressive episodes: Secondary | ICD-10-CM

## 2011-04-10 DIAGNOSIS — I10 Essential (primary) hypertension: Secondary | ICD-10-CM

## 2011-04-10 DIAGNOSIS — E538 Deficiency of other specified B group vitamins: Secondary | ICD-10-CM

## 2011-04-10 DIAGNOSIS — F411 Generalized anxiety disorder: Secondary | ICD-10-CM

## 2011-04-10 DIAGNOSIS — E559 Vitamin D deficiency, unspecified: Secondary | ICD-10-CM

## 2011-04-10 DIAGNOSIS — E785 Hyperlipidemia, unspecified: Secondary | ICD-10-CM

## 2011-04-10 NOTE — Assessment & Plan Note (Signed)
Continue with current prescription therapy as reflected on the Med list.  

## 2011-04-10 NOTE — Progress Notes (Signed)
Patient ID: [name] [name], female   DOB: 07/20/1940, 71 y.o.   MRN: [id number]  Subjective:    Patient ID: [name] [name], female    DOB: Aug 22, 1940, 71 y.o.   MRN: [id number]  HPI  The patient presents for a follow-up of  chronic hypertension, chronic dyslipidemia, type 2 pre-diabetes controlled with medicines, fatigue; B12 and vit D def.   Her mother died on 2024/02/02She is upset  Wt Readings from Last 3 Encounters:  04/10/11 143 lb (64.864 kg)  01/09/11 146 lb (66.225 kg)  11/06/10 145 lb (65.772 kg)   BP Readings from Last 3 Encounters:  04/10/11 148/98  01/09/11 140/98  11/06/10 142/80      Review of Systems  Constitutional: Positive for fatigue. Negative for chills, activity change, appetite change and unexpected weight change.  HENT: Negative for congestion, mouth sores and sinus pressure.   Eyes: Negative for visual disturbance.  Respiratory: Negative for cough and chest tightness.   Gastrointestinal: Negative for nausea and abdominal pain.  Genitourinary: Negative for frequency, difficulty urinating and vaginal pain.  Musculoskeletal: Negative for back pain and gait problem.  Skin: Negative for pallor and rash.  Neurological: Negative for dizziness, tremors, weakness, numbness and headaches.  Psychiatric/Behavioral: Negative for confusion and sleep disturbance.       Objective:   Physical Exam  Constitutional: She appears well-developed and well-nourished. No distress.  HENT:  Head: Normocephalic.  Right Ear: External ear normal.  Left Ear: External ear normal.  Nose: Nose normal.  Mouth/Throat: Oropharynx is clear and moist.  Eyes: Conjunctivae are normal. Pupils are equal, round, and reactive to light. Right eye exhibits no discharge. Left eye exhibits no discharge.  Neck: Normal range of motion. Neck supple. No JVD present. No tracheal deviation present. No thyromegaly present.  Cardiovascular: Normal rate, regular rhythm and normal heart sounds.     Pulmonary/Chest: No stridor. No respiratory distress. She has no wheezes.  Abdominal: Soft. Bowel sounds are normal. She exhibits no distension and no mass. There is no tenderness. There is no rebound and no guarding.  Musculoskeletal: She exhibits no edema and no tenderness.  Lymphadenopathy:    She has no cervical adenopathy.  Neurological: She displays normal reflexes. No cranial nerve deficit. She exhibits normal muscle tone. Coordination normal.  Skin: No rash noted. No erythema.  Psychiatric: She has a normal mood and affect. Her behavior is normal. Judgment and thought content normal.  L breast w/fibrocystic changes R breast - s/p plasty  Lab Results  Component Value Date   WBC 4.1* 10/30/2010   HGB 14.3 10/30/2010   HCT 42.3 10/30/2010   PLT 263.0 10/30/2010   GLUCOSE 131* 12/05/2010   CHOL 270* 10/30/2010   TRIG 263.0* 10/30/2010   HDL 39.10 10/30/2010   LDLDIRECT 173.3 10/30/2010   LDLCALC 122* 12/04/2007   ALT 17 10/30/2010   AST 29 10/30/2010   NA 141 12/05/2010   K 3.9 12/05/2010   CL 106 12/05/2010   CREATININE 1.0 12/05/2010   BUN 16 12/05/2010   CO2 25 12/05/2010   TSH 1.59 10/30/2010   HGBA1C 5.4 12/05/2010         Assessment & Plan:

## 2011-04-10 NOTE — Assessment & Plan Note (Signed)
mammo 04/16/11

## 2011-04-16 ENCOUNTER — Other Ambulatory Visit: Payer: Self-pay | Admitting: Oncology

## 2011-04-16 ENCOUNTER — Ambulatory Visit
Admission: RE | Admit: 2011-04-16 | Discharge: 2011-04-16 | Disposition: A | Discharge status: Home / Self Care | Payer: BC Managed Care – PPO | Source: Ambulatory Visit | Attending: Oncology | Admitting: Oncology

## 2011-04-16 DIAGNOSIS — Z9011 Acquired absence of right breast and nipple: Secondary | ICD-10-CM

## 2011-04-16 DIAGNOSIS — Z1231 Encounter for screening mammogram for malignant neoplasm of breast: Secondary | ICD-10-CM

## 2011-04-16 DIAGNOSIS — N644 Mastodynia: Secondary | ICD-10-CM

## 2011-04-20 ENCOUNTER — Other Ambulatory Visit: Payer: Self-pay | Admitting: Internal Medicine

## 2011-04-23 ENCOUNTER — Other Ambulatory Visit: Payer: Self-pay | Admitting: Internal Medicine

## 2011-05-01 ENCOUNTER — Ambulatory Visit
Admission: RE | Admit: 2011-05-01 | Discharge: 2011-05-01 | Disposition: A | Discharge status: Home / Self Care | Payer: BC Managed Care – PPO | Source: Ambulatory Visit | Attending: Oncology | Admitting: Oncology

## 2011-05-01 DIAGNOSIS — N644 Mastodynia: Secondary | ICD-10-CM

## 2011-06-24 ENCOUNTER — Encounter: Payer: Self-pay | Admitting: Internal Medicine

## 2011-06-24 ENCOUNTER — Encounter: Payer: Self-pay | Admitting: *Deleted

## 2011-06-24 ENCOUNTER — Ambulatory Visit (INDEPENDENT_AMBULATORY_CARE_PROVIDER_SITE_OTHER): Payer: BC Managed Care – PPO | Admitting: Internal Medicine

## 2011-06-24 VITALS — BP 122/72 | HR 94 | Temp 98.1°F | Ht 66.0 in | Wt 143.0 lb

## 2011-06-24 DIAGNOSIS — R059 Cough, unspecified: Secondary | ICD-10-CM

## 2011-06-24 DIAGNOSIS — R05 Cough: Secondary | ICD-10-CM

## 2011-06-24 DIAGNOSIS — J029 Acute pharyngitis, unspecified: Secondary | ICD-10-CM

## 2011-06-24 DIAGNOSIS — J309 Allergic rhinitis, unspecified: Secondary | ICD-10-CM

## 2011-06-24 NOTE — Progress Notes (Signed)
  Subjective:    Patient ID: [name] [name], female    DOB: 1940-12-04, 71 y.o.   MRN: [id number]  Cough This is a new problem. The current episode started in the past 7 days. The problem has been rapidly worsening. The problem occurs every few minutes. The cough is non-productive. Associated symptoms include chills, headaches, nasal congestion, postnasal drip, rhinorrhea and a sore throat. Pertinent negatives include no ear pain, hemoptysis or shortness of breath. Fever: LGF. The symptoms are aggravated by dust. She has tried prescription cough suppressant for the symptoms. The treatment provided mild relief. Her past medical history is significant for environmental allergies. There is no history of asthma, COPD or emphysema.   Past Medical History  Diagnosis Date  . Thyroid cancer     Papillary Stage 1 - Dr Everardo All  . S/P thyroidectomy 07/2005    2 cm largest diameter (1 other tiny focus)/ i-131 rx 99 [facility] 08/2005  . Hyperlipidemia   . Hypothyroidism   . Breast cancer hx 2004    recurrent 2006 DR Magrinat  . Vitamin B12 deficiency 2009  . Vitamin d deficiency 2009  . Anxiety   . Allergic rhinitis   . Psoriasis   . HTN (hypertension)     Review of Systems  Constitutional: Positive for chills. Fever: LGF.  HENT: Positive for sore throat, rhinorrhea and postnasal drip. Negative for ear pain.   Respiratory: Positive for cough. Negative for hemoptysis and shortness of breath.   Neurological: Positive for headaches.  Hematological: Positive for environmental allergies.       Objective:   Physical Exam BP 122/72  Pulse 94  Temp(Src) 98.1 F (36.7 C) (Oral)  Ht 5\' 6"  (1.676 m)  Wt 143 lb (64.864 kg)  BMI 23.08 kg/m2  SpO2 97% Wt Readings from Last 3 Encounters:  06/24/11 143 lb (64.864 kg)  04/10/11 143 lb (64.864 kg)  01/09/11 146 lb (66.225 kg)   Constitutional: She appears well-developed and well-nourished. No distress.  HENT: Head: Normocephalic and atraumatic, sinus  nontender. Ears: B TMs ok, no erythema or effusion; Nose: Nose normal. Mouth/Throat: Oropharynx is red with PND/cobblestoningt. No oropharyngeal exudate.  Eyes: Conjunctivae and EOM are normal. Pupils are equal, round, and reactive to light. No scleral icterus.  Neck: Normal range of motion. Neck supple. No JVD or LAD present. No thyromegaly present.  Cardiovascular: Normal rate, regular rhythm and normal heart sounds.  No murmur heard. No BLE edema. Pulmonary/Chest: Effort normal and breath sounds normal. No respiratory distress. She has no wheezes.  Psychiatric: She has a normal mood and affect. Her behavior is normal. Judgment and thought content normal.        Assessment & Plan:  Acute pharyngitis - suspect viral with allergic rhinitis but exposure to strep over past 72h allergic rhinitis  Cough due to PND and URI  Empiric Amox  Zyrtec Tessalon prn Jury excuse note for today provided Time spent with pt today 25 minutes, greater than 50% time spent counseling patient on jury excuse, cough/sore throat and medication review. Also review of prior records

## 2011-06-24 NOTE — Patient Instructions (Signed)
It was good to see you today. Amoxicillin antibiotics, tessalon for cough and Zyrtec daily for next 7 days, then as needed Payton Mccallum excuse note provided as requested Alternate between ibuprofen and tylenol for aches, pain and fever symptoms as discussed Hydrate, rest and call if worse or unimproved in next 10 days

## 2011-07-16 ENCOUNTER — Ambulatory Visit (INDEPENDENT_AMBULATORY_CARE_PROVIDER_SITE_OTHER): Payer: BC Managed Care – PPO | Admitting: Internal Medicine

## 2011-07-16 ENCOUNTER — Encounter: Payer: Self-pay | Admitting: Internal Medicine

## 2011-07-16 VITALS — BP 130/90 | HR 84 | Temp 98.2°F | Resp 16 | Wt 144.0 lb

## 2011-07-16 DIAGNOSIS — R739 Hyperglycemia, unspecified: Secondary | ICD-10-CM

## 2011-07-16 DIAGNOSIS — E559 Vitamin D deficiency, unspecified: Secondary | ICD-10-CM

## 2011-07-16 DIAGNOSIS — F3289 Other specified depressive episodes: Secondary | ICD-10-CM

## 2011-07-16 DIAGNOSIS — I10 Essential (primary) hypertension: Secondary | ICD-10-CM

## 2011-07-16 DIAGNOSIS — F329 Major depressive disorder, single episode, unspecified: Secondary | ICD-10-CM

## 2011-07-16 DIAGNOSIS — F411 Generalized anxiety disorder: Secondary | ICD-10-CM

## 2011-07-16 DIAGNOSIS — E538 Deficiency of other specified B group vitamins: Secondary | ICD-10-CM

## 2011-07-16 DIAGNOSIS — R7309 Other abnormal glucose: Secondary | ICD-10-CM

## 2011-07-16 DIAGNOSIS — E785 Hyperlipidemia, unspecified: Secondary | ICD-10-CM

## 2011-07-16 MED ORDER — ATORVASTATIN CALCIUM 40 MG PO TABS: 40.0000 mg | ORAL_TABLET | Freq: Every day | ORAL | Status: DC

## 2011-07-16 NOTE — Progress Notes (Signed)
Patient ID: [name] [name], female   DOB: Jun 06, 1940, 71 y.o.   MRN: [id number] Patient ID: [name] [name], female   DOB: 1941/02/07, 71 y.o.   MRN: [id number]  Subjective:    Patient ID: [name] [name], female    DOB: Dec 19, 1940, 71 y.o.   MRN: [id number]  HPI  The patient presents for a follow-up of  chronic hypertension, depression, chronic dyslipidemia, type 2 pre-diabetes controlled with medicines, fatigue; B12 and vit D def.   Her mother died on 30-Jan-2024She is grieving  Wt Readings from Last 3 Encounters:  07/16/11 144 lb (65.318 kg)  06/24/11 143 lb (64.864 kg)  04/10/11 143 lb (64.864 kg)   BP Readings from Last 3 Encounters:  07/16/11 130/90  06/24/11 122/72  04/10/11 148/98      Review of Systems  Constitutional: Positive for fatigue. Negative for chills, activity change, appetite change and unexpected weight change.  HENT: Negative for congestion, mouth sores and sinus pressure.   Eyes: Negative for visual disturbance.  Respiratory: Negative for cough and chest tightness.   Gastrointestinal: Negative for nausea and abdominal pain.  Genitourinary: Negative for frequency, difficulty urinating and vaginal pain.  Musculoskeletal: Negative for back pain and gait problem.  Skin: Negative for pallor and rash.  Neurological: Negative for dizziness, tremors, weakness, numbness and headaches.  Psychiatric/Behavioral: Negative for confusion and sleep disturbance.       Objective:   Physical Exam  Constitutional: She appears well-developed and well-nourished. No distress.  HENT:  Head: Normocephalic.  Right Ear: External ear normal.  Left Ear: External ear normal.  Nose: Nose normal.  Mouth/Throat: Oropharynx is clear and moist.  Eyes: Conjunctivae are normal. Pupils are equal, round, and reactive to light. Right eye exhibits no discharge. Left eye exhibits no discharge.  Neck: Normal range of motion. Neck supple. No JVD present. No tracheal deviation present. No  thyromegaly present.  Cardiovascular: Normal rate, regular rhythm and normal heart sounds.   Pulmonary/Chest: No stridor. No respiratory distress. She has no wheezes.  Abdominal: Soft. Bowel sounds are normal. She exhibits no distension and no mass. There is no tenderness. There is no rebound and no guarding.  Musculoskeletal: She exhibits no edema and no tenderness.  Lymphadenopathy:    She has no cervical adenopathy.  Neurological: She displays normal reflexes. No cranial nerve deficit. She exhibits normal muscle tone. Coordination normal.  Skin: No rash noted. No erythema.  Psychiatric: She has a normal mood and affect. Her behavior is normal. Judgment and thought content normal.     Lab Results  Component Value Date   WBC 4.1* 10/30/2010   HGB 14.3 10/30/2010   HCT 42.3 10/30/2010   PLT 263.0 10/30/2010   GLUCOSE 131* 12/05/2010   CHOL 270* 10/30/2010   TRIG 263.0* 10/30/2010   HDL 39.10 10/30/2010   LDLDIRECT 173.3 10/30/2010   LDLCALC 122* 12/04/2007   ALT 17 10/30/2010   AST 29 10/30/2010   NA 141 12/05/2010   K 3.9 12/05/2010   CL 106 12/05/2010   CREATININE 1.0 12/05/2010   BUN 16 12/05/2010   CO2 25 12/05/2010   TSH 1.59 10/30/2010   HGBA1C 5.4 12/05/2010         Assessment & Plan:

## 2011-07-16 NOTE — Assessment & Plan Note (Signed)
Will watch 

## 2011-07-16 NOTE — Assessment & Plan Note (Signed)
Continue with current prescription therapy as reflected on the Med list.  

## 2011-07-16 NOTE — Assessment & Plan Note (Signed)
Change to Lipitor due to cost 

## 2011-09-06 ENCOUNTER — Telehealth: Payer: Self-pay | Admitting: Oncology

## 2011-09-06 NOTE — Telephone Encounter (Signed)
S/w pt re appts for 8/28 and 9/4.

## 2011-10-09 ENCOUNTER — Other Ambulatory Visit (HOSPITAL_BASED_OUTPATIENT_CLINIC_OR_DEPARTMENT_OTHER): Payer: BC Managed Care – PPO | Admitting: Lab

## 2011-10-09 DIAGNOSIS — Z853 Personal history of malignant neoplasm of breast: Secondary | ICD-10-CM

## 2011-10-09 DIAGNOSIS — Z09 Encounter for follow-up examination after completed treatment for conditions other than malignant neoplasm: Secondary | ICD-10-CM

## 2011-10-09 DIAGNOSIS — F411 Generalized anxiety disorder: Secondary | ICD-10-CM

## 2011-10-09 LAB — CBC WITH DIFFERENTIAL/PLATELET
BASO%: 0.3 % (ref 0.0–2.0)
Basophils Absolute: 0 10*3/uL (ref 0.0–0.1)
Eosinophils Absolute: 0.1 10*3/uL (ref 0.0–0.5)
HCT: 41.7 % (ref 34.8–46.6)
HGB: 14.4 g/dL (ref 11.6–15.9)
LYMPH%: 48.2 % (ref 14.0–49.7)
MCHC: 34.5 g/dL (ref 31.5–36.0)
MONO#: 0.3 10*3/uL (ref 0.1–0.9)
NEUT#: 1.7 10*3/uL (ref 1.5–6.5)
NEUT%: 41.5 % (ref 38.4–76.8)
Platelets: 227 10*3/uL (ref 145–400)
WBC: 4.1 10*3/uL (ref 3.9–10.3)
lymph#: 2 10*3/uL (ref 0.9–3.3)

## 2011-10-09 LAB — COMPREHENSIVE METABOLIC PANEL (CC13)
BUN: 15 mg/dL (ref 7.0–26.0)
CO2: 27 mEq/L (ref 22–29)
Calcium: 9.4 mg/dL (ref 8.4–10.4)
Chloride: 104 mEq/L (ref 98–107)
Creatinine: 1 mg/dL (ref 0.6–1.1)
Glucose: 113 mg/dl — ABNORMAL HIGH (ref 70–99)
Total Bilirubin: 0.8 mg/dL (ref 0.20–1.20)
Total Protein: 7.6 g/dL (ref 6.4–8.3)

## 2011-10-16 ENCOUNTER — Ambulatory Visit (HOSPITAL_BASED_OUTPATIENT_CLINIC_OR_DEPARTMENT_OTHER): Payer: BC Managed Care – PPO | Admitting: Oncology

## 2011-10-16 VITALS — BP 161/92 | HR 108 | Temp 97.7°F | Resp 20 | Ht 66.0 in | Wt 147.1 lb

## 2011-10-16 DIAGNOSIS — Z85850 Personal history of malignant neoplasm of thyroid: Secondary | ICD-10-CM

## 2011-10-16 DIAGNOSIS — Z853 Personal history of malignant neoplasm of breast: Secondary | ICD-10-CM

## 2011-10-16 NOTE — Progress Notes (Signed)
ID: [name] [name]   DOB: [birth year]  MR#: [id number]  CSN#:[id number]  PCP: [name] Primes, MD GYN:  SU:  OTHER MD:   INTERVAL HISTORY: [name] [name] returns today for followup of her remote breast cancer. Interval history is generally unremarkable. She is going dancing at the [employer] once a week, but not unfortunately otherwise exercising regularly.  REVIEW OF SYSTEMS: She has mild sinus problems, a little of eczema activity, and has a small skin tag in the right neck area that occasionally bothers her when she wears a necklace. Otherwise a detailed review of systems today was entirely noncontributory.   PAST MEDICAL HISTORY: Past Medical History  Diagnosis Date  . Thyroid cancer     Papillary Stage 1 - [name] [name] [name]  . S/P thyroidectomy 07/2005    2 cm largest diameter (1 other tiny focus)/ i-131 rx 99 [facility] 08/2005  . Hyperlipidemia   . Hypothyroidism   . Breast cancer hx 2004    recurrent 2006 [name] [name]  . Vitamin B12 deficiency 2009  . Vitamin d deficiency 2009  . Anxiety   . Allergic rhinitis   . Psoriasis   . HTN (hypertension)     PAST SURGICAL HISTORY: Past Surgical History  Procedure Date  . Thyroidectomy 2007  . Breast lumpectomy   . Mastectomy     Right    FAMILY HISTORY Family History  Problem Relation Age of Onset  . Stroke Mother   . Allergies Mother   . Asthma Mother   . Clotting disorder Mother   . Heart disease Father 9    MI  . Allergies Sister   . Asthma Sister   . Cancer Sister     Breast  . Asthma Brother   . Allergies Daughter   . Asthma Daughter   . Allergies Sister     HEALTH MAINTENANCE: History  Substance Use Topics  . Smoking status: Never Smoker   . Smokeless tobacco: Not on file  . Alcohol Use: No     Colonoscopy:  PAP:  Bone density:  Lipid panel:  Allergies  Allergen Reactions  . Sulfacetamide Sodium-Sulfur     Current Outpatient Prescriptions  Medication Sig Dispense Refill  . ALPRAZolam (XANAX) 0.25 MG  tablet Take 0.25 mg by mouth 2 (two) times daily as needed.        [name] aspirin 81 MG EC tablet Take 81 mg by mouth daily.        . benzonatate (TESSALON) 200 MG capsule Take 1 capsule (200 mg total) by mouth 3 (three) times daily as needed for cough.  30 capsule  1  . cetirizine (ZYRTEC) 10 MG tablet Take 10 mg by mouth daily.      . Cholecalciferol (VITAMIN D3) 50000 UNITS CAPS Take 1 capsule by mouth every 14 (fourteen) days.  6 capsule  3  . cyanocobalamin (,VITAMIN B-12,) 1000 MCG/ML injection Inject 1 mL (1,000 mcg total) into the muscle every 30 (thirty) days. 1 ml q 1 wk x 4, then monthly  10 mL  6  . desvenlafaxine (PRISTIQ) 50 MG 24 hr tablet Take 50 mg by mouth daily.        [name] levothyroxine (SYNTHROID, LEVOTHROID) 88 MCG tablet TAKE 1 TABLET EVERY DAY  90 tablet  1  . losartan (COZAAR) 100 MG tablet 1 BY MOUTH ONCE DAILY FOR BLOOD PRESSURE  30 tablet  5  . Syringe/Needle, Disp, (B-D ECLIPSE SYRINGE) 30G X 1/2" 1 ML MISC 1 each by Does not  apply route 1 day or 1 dose. For Vit B12 inj  50 each  11  . CRESTOR 20 MG tablet       . mometasone (ELOCON) 0.1 % ointment Apply topically 2 (two) times daily as needed.        . promethazine-codeine (PHENERGAN WITH CODEINE) 6.25-10 MG/5ML syrup Take 5-10 mLs by mouth 4 (four) times daily as needed. For cough         OBJECTIVE: [name] in no acute distress Filed Vitals:   10/16/11 1212  BP: 161/92  Pulse: 108  Temp: 97.7 F (36.5 C)  Resp: 20     Body mass index is 23.74 kg/(m^2).    ECOG FS: 1  Sclerae unicteric Oropharynx clear No cervical or supraclavicular adenopathy Lungs no rales or rhonchi Heart regular rate and rhythm Abd benign MSK no focal spinal tenderness, no peripheral edema Neuro: nonfocal Breasts: The right breast is status post mastectomy with TRAM reconstruction. There is no evidence of local recurrence. The left breast is status post reduction mammoplasty. It is otherwise unremarkable.  LAB RESULTS: Lab  Results  Component Value Date   WBC 4.1 10/09/2011   NEUTROABS 1.7 10/09/2011   HGB 14.4 10/09/2011   HCT 41.7 10/09/2011   MCV 92.5 10/09/2011   PLT 227 10/09/2011      Chemistry      Component Value Date/Time   NA 140 10/09/2011 1403   NA 141 12/05/2010 1314   K 4.0 10/09/2011 1403   K 3.9 12/05/2010 1314   CL 104 10/09/2011 1403   CL 106 12/05/2010 1314   CO2 27 10/09/2011 1403   CO2 25 12/05/2010 1314   BUN 15.0 10/09/2011 1403   BUN 16 12/05/2010 1314   CREATININE 1.0 10/09/2011 1403   CREATININE 1.0 12/05/2010 1314      Component Value Date/Time   CALCIUM 9.4 10/09/2011 1403   CALCIUM 9.2 12/05/2010 1314   ALKPHOS 98 10/09/2011 1403   ALKPHOS 99 10/30/2010 1556   AST 22 10/09/2011 1403   AST 29 10/30/2010 1556   ALT 20 10/09/2011 1403   ALT 17 10/30/2010 1556   BILITOT 0.80 10/09/2011 1403   BILITOT 0.9 10/30/2010 1556       Lab Results  Component Value Date   LABCA2 16 10/09/2011    No components found with this basename: [id number]    No results found for this basename: INR:1;PROTIME:1 in the last 168 hours  Urinalysis    Component Value Date/Time   COLORURINE LT. YELLOW 11/08/2009 1451   APPEARANCEUR CLEAR 11/08/2009 1451   LABSPEC >=1.030 11/08/2009 1451   PHURINE 5.5 11/08/2009 1451   BILIRUBINUR NEGATIVE 11/08/2009 1451   KETONESUR TRACE 11/08/2009 1451   UROBILINOGEN 0.2 11/08/2009 1451   NITRITE NEGATIVE 11/08/2009 1451   LEUKOCYTESUR SMALL 11/08/2009 1451    STUDIES: Mammogram March 2013 unremarkable.  ASSESSMENT: 71 y.o. [name]   (1) status post right lumpectomy and sentinel lymph node biopsy in September 2002 for multifocal breast carcinoma, triple negative.  Treated with CMF followed by radiation.   (2) Local recurrence in April 2004, status post right modified radical mastectomy with TRAM reconstruction for what proved to be a T1c N0, stage IA triple negative breast carcinoma.  Treated adjuvantly with paclitaxel and doxorubicin x4 in 2004.  Off  treatment since August 2004 with no evidence of recurrence  (3) history of papillary thyroid cancer s/p thyroidectomy June 2007, s/p radioactive iodine July 2007  PLAN: [name] [name] will be  out 10 years from her right mastectomy April of 2014. In general, triple negative tumors tend to recur early if they recur, so I am very comfortable releasing her to [name]. Loren Racer care. She knows we will be glad to see her at any point in the future if the need arises, but as of now no further appointments are being made for her here.   [name],GUSTAV C    10/16/2011

## 2011-10-17 ENCOUNTER — Telehealth: Payer: Self-pay | Admitting: Oncology

## 2011-10-17 NOTE — Telephone Encounter (Signed)
Copy of letter fax to Dr.Alex Plotnikov,sent to the pt and the scanning center from Dr. Darnelle Catalan

## 2011-10-18 ENCOUNTER — Ambulatory Visit (INDEPENDENT_AMBULATORY_CARE_PROVIDER_SITE_OTHER): Payer: BC Managed Care – PPO | Admitting: Internal Medicine

## 2011-10-18 ENCOUNTER — Encounter: Payer: Self-pay | Admitting: Internal Medicine

## 2011-10-18 VITALS — BP 120/90 | HR 76 | Temp 98.9°F | Resp 16 | Wt 148.0 lb

## 2011-10-18 DIAGNOSIS — E559 Vitamin D deficiency, unspecified: Secondary | ICD-10-CM

## 2011-10-18 DIAGNOSIS — R5383 Other fatigue: Secondary | ICD-10-CM

## 2011-10-18 DIAGNOSIS — F329 Major depressive disorder, single episode, unspecified: Secondary | ICD-10-CM

## 2011-10-18 DIAGNOSIS — F411 Generalized anxiety disorder: Secondary | ICD-10-CM

## 2011-10-18 DIAGNOSIS — I10 Essential (primary) hypertension: Secondary | ICD-10-CM

## 2011-10-18 DIAGNOSIS — E538 Deficiency of other specified B group vitamins: Secondary | ICD-10-CM

## 2011-10-18 DIAGNOSIS — E890 Postprocedural hypothyroidism: Secondary | ICD-10-CM

## 2011-10-18 DIAGNOSIS — R5381 Other malaise: Secondary | ICD-10-CM

## 2011-10-18 DIAGNOSIS — B079 Viral wart, unspecified: Secondary | ICD-10-CM | POA: Insufficient documentation

## 2011-10-18 MED ORDER — VITAMIN D3 1.25 MG (50000 UT) PO CAPS: 1.0000 | ORAL_CAPSULE | ORAL | Status: DC

## 2011-10-18 MED ORDER — LEVOTHYROXINE SODIUM 88 MCG PO TABS: 88.0000 ug | ORAL_TABLET | Freq: Every day | ORAL | Status: DC

## 2011-10-18 NOTE — Assessment & Plan Note (Signed)
Continue with current prescription therapy as reflected on the Med list.  

## 2011-10-18 NOTE — Assessment & Plan Note (Signed)
Sleep study suggested

## 2011-10-18 NOTE — Assessment & Plan Note (Signed)
Cryo  

## 2011-10-18 NOTE — Patient Instructions (Signed)
   Postprocedure instructions :     Keep the wounds clean. You can wash them with liquid soap and water. Pat dry with gauze or a Kleenex tissue  Before applying antibiotic ointment and a Band-Aid.   You need to report immediately  if  any signs of infection develop.    

## 2011-10-18 NOTE — Progress Notes (Signed)
Subjective:    Patient ID: [name] [name], female    DOB: 1940/05/11, 71 y.o.   MRN: [id number]  HPI  The patient presents for a follow-up of  chronic hypertension, depression, chronic dyslipidemia, type 2 pre-diabetes controlled with medicines, fatigue; B12 and vit D def.   Her mother died on 02-03-2024She has been grieving  Wt Readings from Last 3 Encounters:  10/18/11 148 lb (67.132 kg)  10/16/11 147 lb 1.6 oz (66.724 kg)  07/16/11 144 lb (65.318 kg)   BP Readings from Last 3 Encounters:  10/18/11 120/90  10/16/11 161/92  07/16/11 130/90      Review of Systems  Constitutional: Positive for fatigue. Negative for chills, activity change, appetite change and unexpected weight change.  HENT: Negative for congestion, mouth sores and sinus pressure.   Eyes: Negative for visual disturbance.  Respiratory: Negative for cough and chest tightness.   Gastrointestinal: Negative for nausea and abdominal pain.  Genitourinary: Negative for frequency, difficulty urinating and vaginal pain.  Musculoskeletal: Negative for back pain and gait problem.  Skin: Negative for pallor and rash.  Neurological: Negative for dizziness, tremors, weakness, numbness and headaches.  Psychiatric/Behavioral: Negative for confusion and disturbed wake/sleep cycle.       Objective:   Physical Exam  Constitutional: She appears well-developed and well-nourished. No distress.  HENT:  Head: Normocephalic.  Right Ear: External ear normal.  Left Ear: External ear normal.  Nose: Nose normal.  Mouth/Throat: Oropharynx is clear and moist.  Eyes: Conjunctivae are normal. Pupils are equal, round, and reactive to light. Right eye exhibits no discharge. Left eye exhibits no discharge.  Neck: Normal range of motion. Neck supple. No JVD present. No tracheal deviation present. No thyromegaly present.  Cardiovascular: Normal rate, regular rhythm and normal heart sounds.   Pulmonary/Chest: No stridor. No respiratory  distress. She has no wheezes.  Abdominal: Soft. Bowel sounds are normal. She exhibits no distension and no mass. There is no tenderness. There is no rebound and no guarding.  Musculoskeletal: She exhibits no edema and no tenderness.  Lymphadenopathy:    She has no cervical adenopathy.  Neurological: She displays normal reflexes. No cranial nerve deficit. She exhibits normal muscle tone. Coordination normal.  Skin: No rash noted. No erythema.  Psychiatric: She has a normal mood and affect. Her behavior is normal. Judgment and thought content normal.     Lab Results  Component Value Date   WBC 4.1 10/09/2011   HGB 14.4 10/09/2011   HCT 41.7 10/09/2011   PLT 227 10/09/2011   GLUCOSE 113* 10/09/2011   CHOL 270* 10/30/2010   TRIG 263.0* 10/30/2010   HDL 39.10 10/30/2010   LDLDIRECT 173.3 10/30/2010   LDLCALC 122* 12/04/2007   ALT 20 10/09/2011   AST 22 10/09/2011   NA 140 10/09/2011   K 4.0 10/09/2011   CL 104 10/09/2011   CREATININE 1.0 10/09/2011   BUN 15.0 10/09/2011   CO2 27 10/09/2011   TSH 1.59 10/30/2010   HGBA1C 5.4 12/05/2010     Procedure Note :     Procedure : Cryosurgery   Indication:  Wart(s)     Risks including unsuccessful procedure , bleeding, infection, bruising, scar, a need for a repeat  procedure and others were explained to the patient in detail as well as the benefits. Informed consent was obtained verbally.    2 lesion(s)  on  trunk was/were treated with liquid nitrogen on a Q-tip in a usual fasion . Band-Aid was applied  and antibiotic ointment was given for a later use.   Tolerated well. Complications none.   Postprocedure instructions :     Keep the wounds clean. You can wash them with liquid soap and water. Pat dry with gauze or a Kleenex tissue  Before applying antibiotic ointment and a Band-Aid.   You need to report immediately  if  any signs of infection develop.        Assessment & Plan:

## 2011-11-11 ENCOUNTER — Other Ambulatory Visit: Payer: Self-pay | Admitting: Internal Medicine

## 2012-01-21 ENCOUNTER — Ambulatory Visit: Payer: BC Managed Care – PPO | Admitting: Internal Medicine

## 2012-01-24 ENCOUNTER — Ambulatory Visit: Payer: BC Managed Care – PPO | Admitting: Internal Medicine

## 2012-02-08 ENCOUNTER — Other Ambulatory Visit: Payer: Self-pay | Admitting: Internal Medicine

## 2012-02-25 ENCOUNTER — Encounter: Payer: Self-pay | Admitting: Internal Medicine

## 2012-02-25 ENCOUNTER — Ambulatory Visit (INDEPENDENT_AMBULATORY_CARE_PROVIDER_SITE_OTHER): Payer: BC Managed Care – PPO | Admitting: Internal Medicine

## 2012-02-25 ENCOUNTER — Other Ambulatory Visit (INDEPENDENT_AMBULATORY_CARE_PROVIDER_SITE_OTHER): Payer: BC Managed Care – PPO

## 2012-02-25 VITALS — BP 144/98 | HR 80 | Temp 98.1°F | Resp 16 | Wt 144.0 lb

## 2012-02-25 DIAGNOSIS — F3289 Other specified depressive episodes: Secondary | ICD-10-CM

## 2012-02-25 DIAGNOSIS — E538 Deficiency of other specified B group vitamins: Secondary | ICD-10-CM

## 2012-02-25 DIAGNOSIS — F411 Generalized anxiety disorder: Secondary | ICD-10-CM

## 2012-02-25 DIAGNOSIS — I10 Essential (primary) hypertension: Secondary | ICD-10-CM

## 2012-02-25 DIAGNOSIS — E890 Postprocedural hypothyroidism: Secondary | ICD-10-CM

## 2012-02-25 DIAGNOSIS — R5381 Other malaise: Secondary | ICD-10-CM

## 2012-02-25 DIAGNOSIS — E559 Vitamin D deficiency, unspecified: Secondary | ICD-10-CM

## 2012-02-25 DIAGNOSIS — B079 Viral wart, unspecified: Secondary | ICD-10-CM

## 2012-02-25 DIAGNOSIS — F329 Major depressive disorder, single episode, unspecified: Secondary | ICD-10-CM

## 2012-02-25 DIAGNOSIS — R5383 Other fatigue: Secondary | ICD-10-CM

## 2012-02-25 NOTE — Assessment & Plan Note (Signed)
Continue with current prescription therapy as reflected on the Med list.  

## 2012-02-25 NOTE — Progress Notes (Signed)
   Subjective:    HPI  The patient presents for a follow-up of  chronic hypertension, depression, chronic dyslipidemia, type 2 pre-diabetes controlled with medicines, fatigue; B12 and vit D def.   Her mother died on 03/25/2011 She has been grieving better Her dtr is sick with the neck pain  Wt Readings from Last 3 Encounters:  02/25/12 144 lb (65.318 kg)  10/18/11 148 lb (67.132 kg)  10/16/11 147 lb 1.6 oz (66.724 kg)   BP Readings from Last 3 Encounters:  02/25/12 144/98  10/18/11 120/90  10/16/11 161/92      Review of Systems  Constitutional: Positive for fatigue. Negative for chills, activity change, appetite change and unexpected weight change.  HENT: Negative for congestion, mouth sores and sinus pressure.   Eyes: Negative for visual disturbance.  Respiratory: Negative for cough and chest tightness.   Gastrointestinal: Negative for nausea and abdominal pain.  Genitourinary: Negative for frequency, difficulty urinating and vaginal pain.  Musculoskeletal: Negative for back pain and gait problem.  Skin: Negative for pallor and rash.  Neurological: Negative for dizziness, tremors, weakness, numbness and headaches.  Psychiatric/Behavioral: Negative for confusion and sleep disturbance.       Objective:   Physical Exam  Constitutional: She appears well-developed and well-nourished. No distress.  HENT:  Head: Normocephalic.  Right Ear: External ear normal.  Left Ear: External ear normal.  Nose: Nose normal.  Mouth/Throat: Oropharynx is clear and moist.  Eyes: Conjunctivae normal are normal. Pupils are equal, round, and reactive to light. Right eye exhibits no discharge. Left eye exhibits no discharge.  Neck: Normal range of motion. Neck supple. No JVD present. No tracheal deviation present. No thyromegaly present.  Cardiovascular: Normal rate, regular rhythm and normal heart sounds.   Pulmonary/Chest: No stridor. No respiratory distress. She has no wheezes.    Abdominal: Soft. Bowel sounds are normal. She exhibits no distension and no mass. There is no tenderness. There is no rebound and no guarding.  Musculoskeletal: She exhibits no edema and no tenderness.  Lymphadenopathy:    She has no cervical adenopathy.  Neurological: She displays normal reflexes. No cranial nerve deficit. She exhibits normal muscle tone. Coordination normal.  Skin: No rash noted. No erythema.  Psychiatric: She has a normal mood and affect. Her behavior is normal. Judgment and thought content normal.     Lab Results  Component Value Date   WBC 4.1 10/09/2011   HGB 14.4 10/09/2011   HCT 41.7 10/09/2011   PLT 227 10/09/2011   GLUCOSE 113* 10/09/2011   CHOL 270* 10/30/2010   TRIG 263.0* 10/30/2010   HDL 39.10 10/30/2010   LDLDIRECT 173.3 10/30/2010   LDLCALC 122* 12/04/2007   ALT 20 10/09/2011   AST 22 10/09/2011   NA 140 10/09/2011   K 4.0 10/09/2011   CL 104 10/09/2011   CREATININE 1.0 10/09/2011   BUN 15.0 10/09/2011   CO2 27 10/09/2011   TSH 1.59 10/30/2010   HGBA1C 5.4 12/05/2010           Assessment & Plan:

## 2012-02-26 ENCOUNTER — Telehealth: Payer: Self-pay | Admitting: Internal Medicine

## 2012-02-26 LAB — BASIC METABOLIC PANEL
CO2: 28 mEq/L (ref 19–32)
Calcium: 10 mg/dL (ref 8.4–10.5)
Chloride: 108 mEq/L (ref 96–112)
Creatinine, Ser: 1.1 mg/dL (ref 0.4–1.2)
Sodium: 144 mEq/L (ref 135–145)

## 2012-02-26 LAB — VITAMIN B12: Vitamin B-12: 365 pg/mL (ref 211–911)

## 2012-02-26 LAB — TSH: TSH: 3.61 u[IU]/mL (ref 0.35–5.50)

## 2012-02-26 MED ORDER — VITAMIN D 1000 UNITS PO TABS: 2000.0000 [IU] | ORAL_TABLET | Freq: Every day | ORAL | Status: DC

## 2012-02-26 NOTE — Telephone Encounter (Signed)
Pt informed/ copies mailed to pt

## 2012-02-26 NOTE — Telephone Encounter (Signed)
[name] [name], please, inform patient that her Vit D is better but she needs to increase Vit D to 2000 iu/d Thx

## 2012-03-31 ENCOUNTER — Telehealth: Payer: Self-pay | Admitting: Internal Medicine

## 2012-03-31 ENCOUNTER — Ambulatory Visit: Payer: Self-pay | Admitting: Internal Medicine

## 2012-03-31 NOTE — Telephone Encounter (Signed)
Agree w/OV Thx 

## 2012-03-31 NOTE — Telephone Encounter (Signed)
Patient Information:  Caller Name: Halia  Phone: [phone]  Patient: [name] [name], [name] [name]  Gender: Female  DOB: 11-14-1940  Age: 72 Years  PCP: Plotnikov, Alex (Adults only)  Office Follow Up:  Does the office need to follow up with this patient?: No  Instructions For The Office: N/A   Symptoms  Reason For Call & Symptoms: Patient reports, "I get a sinus infection about once a year and I think this is it".  Sore throat, upper teeth pain, pain in cheek bones, postnasal drip; clear runny nasal secretions..  Denies fever.  Home care measures have helped. Symptoms worse when she is lying down.  Pain rated at 4-5 of 10.  Earache bilateral, right worse than left; this triggers See Today in Office.  Reviewed Health History In EMR: Yes  Reviewed Medications In EMR: Yes  Reviewed Allergies In EMR: Yes  Reviewed Surgeries / Procedures: Yes  Date of Onset of Symptoms: 03/29/2012  Treatments Tried: Zyrtec, Nighttime Cough Syrup, Humidifier, Promethazine/Codeine Cough Syrup  Treatments Tried Worked: Yes  Guideline(s) Used:  Sinus Pain and Congestion  Disposition Per Guideline:   See Today in Office  Reason For Disposition Reached:   Earache  Advice Given:  Reassurance:   Sinus congestion is a normal part of a cold.  Antibiotics are not helpful for the sinus congestion that occurs with colds.  Here is some care advice that should help.  For a Runny Nose With Profuse Discharge:  Nasal mucus and discharge helps to wash viruses and bacteria out of the nose and sinuses.  If the skin around your nostrils gets irritated, apply a tiny amount of petroleum ointment to the nasal openings once or twice a day.  For a Stuffy Nose - Use Nasal Washes:  Introduction: Saline (salt water) nasal irrigation (nasal wash) is an effective and simple home remedy for treating stuffy nose and sinus congestion. The nose can be irrigated by pouring, spraying, or squirting salt water into the nose and then letting it  run back out.  How to Make Saline [facility] Water) Nasal Wash :  You can make your own saline nasal wash.  Add 1/2 tsp of table salt to 1 cup (8 oz; 240 ml) of warm water.  You should use sterile, distilled, or previously boiled water for nasal irrigation.  Medicines for a Stuffy or Runny Nose:  Antihistamines: Are only helpful if you also have nasal allergies.  Pain and Fever Medicines:  For pain or fever relief, take either acetaminophen or ibuprofen.  They are over-the-counter (OTC) drugs that help treat both fever and pain. You can buy them at the drugstore.  Acetaminophen (e.g., Tylenol):  Regular Strength Tylenol: Take 650 mg (two 325 mg pills) by mouth every 4-6 hours as needed. Each Regular Strength Tylenol pill has 325 mg of acetaminophen.  Extra Strength Tylenol: Take 1,000 mg (two 500 mg pills) every 8 hours as needed. Each Extra Strength Tylenol pill has 500 mg of acetaminophen.  The most you should take each day is 3,000 mg (10 Regular Strength or 6 Extra Strength pills a day).  Hydration:  Drink plenty of liquids (6-8 glasses of water daily). If the air in your home is dry, use a cool mist humidifier  Expected Course:  Sinus congestion from viral upper respiratory infections (colds) usually lasts 5-10 days.  Occasionally a cold can worsen and turn into bacterial sinusitis. Clues to this are sinus symptoms lasting longer than 10 days, fever lasting longer than 3 days,  and worsening pain. Bacterial sinusitis may need antibiotic treatment.  Call Back If:   You become worse.  Appointment Scheduled:  03/31/2012 16:15:00 Appointment Scheduled Provider:  Sonda Primes (Adults only)

## 2012-04-02 ENCOUNTER — Other Ambulatory Visit: Payer: Self-pay | Admitting: Internal Medicine

## 2012-04-02 DIAGNOSIS — Z1231 Encounter for screening mammogram for malignant neoplasm of breast: Secondary | ICD-10-CM

## 2012-05-01 ENCOUNTER — Ambulatory Visit
Admission: RE | Admit: 2012-05-01 | Discharge: 2012-05-01 | Disposition: A | Discharge status: Home / Self Care | Payer: BC Managed Care – PPO | Source: Ambulatory Visit | Attending: Internal Medicine | Admitting: Internal Medicine

## 2012-05-01 DIAGNOSIS — Z1231 Encounter for screening mammogram for malignant neoplasm of breast: Secondary | ICD-10-CM

## 2012-05-26 ENCOUNTER — Encounter: Payer: Self-pay | Admitting: Internal Medicine

## 2012-05-26 ENCOUNTER — Ambulatory Visit (INDEPENDENT_AMBULATORY_CARE_PROVIDER_SITE_OTHER): Payer: BC Managed Care – PPO | Admitting: Internal Medicine

## 2012-05-26 VITALS — BP 140/90 | HR 80 | Temp 97.7°F | Resp 16 | Wt 146.0 lb

## 2012-05-26 DIAGNOSIS — R739 Hyperglycemia, unspecified: Secondary | ICD-10-CM

## 2012-05-26 DIAGNOSIS — E538 Deficiency of other specified B group vitamins: Secondary | ICD-10-CM

## 2012-05-26 DIAGNOSIS — R5381 Other malaise: Secondary | ICD-10-CM

## 2012-05-26 DIAGNOSIS — R7309 Other abnormal glucose: Secondary | ICD-10-CM

## 2012-05-26 DIAGNOSIS — R5383 Other fatigue: Secondary | ICD-10-CM

## 2012-05-26 DIAGNOSIS — Z23 Encounter for immunization: Secondary | ICD-10-CM

## 2012-05-26 DIAGNOSIS — E559 Vitamin D deficiency, unspecified: Secondary | ICD-10-CM

## 2012-05-26 DIAGNOSIS — I10 Essential (primary) hypertension: Secondary | ICD-10-CM

## 2012-05-26 DIAGNOSIS — F329 Major depressive disorder, single episode, unspecified: Secondary | ICD-10-CM

## 2012-05-26 DIAGNOSIS — F411 Generalized anxiety disorder: Secondary | ICD-10-CM

## 2012-05-26 MED ORDER — ROSUVASTATIN CALCIUM 20 MG PO TABS: 20.0000 mg | ORAL_TABLET | Freq: Every day | ORAL | Status: DC

## 2012-05-26 MED ORDER — AMLODIPINE BESYLATE 2.5 MG PO TABS: 2.5000 mg | ORAL_TABLET | Freq: Every day | ORAL | Status: DC

## 2012-05-26 NOTE — Assessment & Plan Note (Signed)
Continue with current prescription therapy as reflected on the Med list.  

## 2012-05-26 NOTE — Progress Notes (Signed)
   Subjective:    HPI  The patient presents for a follow-up of  chronic hypertension, depression, chronic dyslipidemia, type 2 pre-diabetes controlled with medicines, fatigue; B12 and vit D def.   Her mother died on March 17, 2011 She has been grieving - better now Her dtr is sick with the neck pain  Wt Readings from Last 3 Encounters:  05/26/12 146 lb (66.225 kg)  02/25/12 144 lb (65.318 kg)  10/18/11 148 lb (67.132 kg)   BP Readings from Last 3 Encounters:  05/26/12 140/90  02/25/12 144/98  10/18/11 120/90      Review of Systems  Constitutional: Positive for fatigue. Negative for chills, activity change, appetite change and unexpected weight change.  HENT: Negative for congestion, mouth sores and sinus pressure.   Eyes: Negative for visual disturbance.  Respiratory: Negative for cough and chest tightness.   Gastrointestinal: Negative for nausea and abdominal pain.  Genitourinary: Negative for frequency, difficulty urinating and vaginal pain.  Musculoskeletal: Negative for back pain and gait problem.  Skin: Negative for pallor and rash.  Neurological: Negative for dizziness, tremors, weakness, numbness and headaches.  Psychiatric/Behavioral: Negative for confusion and sleep disturbance.       Objective:   Physical Exam  Constitutional: She appears well-developed and well-nourished. No distress.  HENT:  Head: Normocephalic.  Right Ear: External ear normal.  Left Ear: External ear normal.  Nose: Nose normal.  Mouth/Throat: Oropharynx is clear and moist.  Eyes: Conjunctivae are normal. Pupils are equal, round, and reactive to light. Right eye exhibits no discharge. Left eye exhibits no discharge.  Neck: Normal range of motion. Neck supple. No JVD present. No tracheal deviation present. No thyromegaly present.  Cardiovascular: Normal rate, regular rhythm and normal heart sounds.   Pulmonary/Chest: No stridor. No respiratory distress. She has no wheezes.  Abdominal:  Soft. Bowel sounds are normal. She exhibits no distension and no mass. There is no tenderness. There is no rebound and no guarding.  Musculoskeletal: She exhibits no edema and no tenderness.  Lymphadenopathy:    She has no cervical adenopathy.  Neurological: She displays normal reflexes. No cranial nerve deficit. She exhibits normal muscle tone. Coordination normal.  Skin: No rash noted. No erythema.  Psychiatric: She has a normal mood and affect. Her behavior is normal. Judgment and thought content normal.     Lab Results  Component Value Date   WBC 4.1 10/09/2011   HGB 14.4 10/09/2011   HCT 41.7 10/09/2011   PLT 227 10/09/2011   GLUCOSE 111* 02/25/2012   CHOL 270* 10/30/2010   TRIG 263.0* 10/30/2010   HDL 39.10 10/30/2010   LDLDIRECT 173.3 10/30/2010   LDLCALC 122* 12/04/2007   ALT 20 10/09/2011   AST 22 10/09/2011   NA 144 02/25/2012   K 4.9 02/25/2012   CL 108 02/25/2012   CREATININE 1.1 02/25/2012   BUN 20 02/25/2012   CO2 28 02/25/2012   TSH 3.61 02/25/2012   HGBA1C 5.4 12/05/2010           Assessment & Plan:

## 2012-05-26 NOTE — Assessment & Plan Note (Signed)
BP is elevated

## 2012-05-28 NOTE — Assessment & Plan Note (Signed)
Better  

## 2012-05-28 NOTE — Assessment & Plan Note (Signed)
Continue with current prescription therapy as reflected on the Med list.  

## 2012-05-28 NOTE — Assessment & Plan Note (Signed)
Watching CBGs 

## 2012-05-29 ENCOUNTER — Other Ambulatory Visit: Payer: Self-pay | Admitting: Internal Medicine

## 2012-07-16 ENCOUNTER — Other Ambulatory Visit: Payer: Self-pay | Admitting: *Deleted

## 2012-07-16 MED ORDER — ROSUVASTATIN CALCIUM 20 MG PO TABS: 20.0000 mg | ORAL_TABLET | Freq: Every day | ORAL | Status: DC

## 2012-07-24 ENCOUNTER — Ambulatory Visit: Payer: BC Managed Care – PPO | Admitting: Internal Medicine

## 2012-08-19 ENCOUNTER — Telehealth: Payer: Self-pay | Admitting: *Deleted

## 2012-08-19 NOTE — Telephone Encounter (Addendum)
Try Mucinex OV w/any provider tomorrow Thx

## 2012-08-19 NOTE — Telephone Encounter (Signed)
Pt left vm c/o sinusitis. She states she has been taking Zyrtec and using her nettie pot with no relieve. She is requesting a Rx for this. Please advise.

## 2012-08-20 NOTE — Telephone Encounter (Signed)
Pt informed

## 2012-09-22 ENCOUNTER — Ambulatory Visit: Payer: BC Managed Care – PPO | Admitting: Internal Medicine

## 2012-09-24 ENCOUNTER — Telehealth: Payer: Self-pay | Admitting: Internal Medicine

## 2012-09-24 DIAGNOSIS — R739 Hyperglycemia, unspecified: Secondary | ICD-10-CM

## 2012-09-24 DIAGNOSIS — I10 Essential (primary) hypertension: Secondary | ICD-10-CM

## 2012-09-24 DIAGNOSIS — E559 Vitamin D deficiency, unspecified: Secondary | ICD-10-CM

## 2012-09-24 DIAGNOSIS — E538 Deficiency of other specified B group vitamins: Secondary | ICD-10-CM

## 2012-09-24 DIAGNOSIS — E890 Postprocedural hypothyroidism: Secondary | ICD-10-CM

## 2012-09-24 DIAGNOSIS — E785 Hyperlipidemia, unspecified: Secondary | ICD-10-CM

## 2012-09-24 NOTE — Telephone Encounter (Signed)
Pt called request blood work prior to her appt which is 10/05/12. Please call pt if this is ok. Pt stated that Dr. Macario Golds always do blood work a week before.

## 2012-09-25 NOTE — Telephone Encounter (Signed)
What labs are needed? 

## 2012-09-28 NOTE — Telephone Encounter (Signed)
CMET, B12, Vit D, lipids, TSH Thx

## 2012-09-30 NOTE — Telephone Encounter (Signed)
Labs entered.

## 2012-10-05 ENCOUNTER — Ambulatory Visit (INDEPENDENT_AMBULATORY_CARE_PROVIDER_SITE_OTHER): Payer: BC Managed Care – PPO | Admitting: Internal Medicine

## 2012-10-05 ENCOUNTER — Encounter: Payer: Self-pay | Admitting: Internal Medicine

## 2012-10-05 ENCOUNTER — Other Ambulatory Visit (INDEPENDENT_AMBULATORY_CARE_PROVIDER_SITE_OTHER): Payer: BC Managed Care – PPO

## 2012-10-05 VITALS — BP 148/86 | HR 80 | Temp 97.9°F | Resp 16 | Wt 143.0 lb

## 2012-10-05 DIAGNOSIS — F329 Major depressive disorder, single episode, unspecified: Secondary | ICD-10-CM

## 2012-10-05 DIAGNOSIS — R7309 Other abnormal glucose: Secondary | ICD-10-CM

## 2012-10-05 DIAGNOSIS — I10 Essential (primary) hypertension: Secondary | ICD-10-CM

## 2012-10-05 DIAGNOSIS — R739 Hyperglycemia, unspecified: Secondary | ICD-10-CM

## 2012-10-05 DIAGNOSIS — F3289 Other specified depressive episodes: Secondary | ICD-10-CM

## 2012-10-05 DIAGNOSIS — E785 Hyperlipidemia, unspecified: Secondary | ICD-10-CM

## 2012-10-05 DIAGNOSIS — E538 Deficiency of other specified B group vitamins: Secondary | ICD-10-CM

## 2012-10-05 DIAGNOSIS — E890 Postprocedural hypothyroidism: Secondary | ICD-10-CM

## 2012-10-05 DIAGNOSIS — E559 Vitamin D deficiency, unspecified: Secondary | ICD-10-CM

## 2012-10-05 DIAGNOSIS — J309 Allergic rhinitis, unspecified: Secondary | ICD-10-CM

## 2012-10-05 LAB — COMPREHENSIVE METABOLIC PANEL
Albumin: 4.1 g/dL (ref 3.5–5.2)
Alkaline Phosphatase: 87 U/L (ref 39–117)
BUN: 15 mg/dL (ref 6–23)
CO2: 29 mEq/L (ref 19–32)
Calcium: 9.2 mg/dL (ref 8.4–10.5)
GFR: 51.67 mL/min — ABNORMAL LOW (ref >60.00)
Glucose, Bld: 105 mg/dL — ABNORMAL HIGH (ref 70–99)
Potassium: 4.1 mEq/L (ref 3.5–5.1)

## 2012-10-05 LAB — LIPID PANEL
Cholesterol: 269 mg/dL — ABNORMAL HIGH (ref 0–200)
VLDL: 63.6 mg/dL — ABNORMAL HIGH (ref 0.0–40.0)

## 2012-10-05 MED ORDER — AMLODIPINE BESYLATE 2.5 MG PO TABS: 2.5000 mg | ORAL_TABLET | Freq: Every day | ORAL | Status: DC

## 2012-10-05 NOTE — Assessment & Plan Note (Signed)
Continue with current prescription therapy as reflected on the Med list.  

## 2012-10-05 NOTE — Progress Notes (Signed)
   Subjective:    HPI  The patient presents for a follow-up of  chronic hypertension, depression, chronic dyslipidemia, type 2 pre-diabetes controlled with medicines, fatigue; B12 and vit D def.   Alva changed diet and decided to see how she would do w/o Crestor... Not taking Norvasc 05-Oct-2022)  Her mother died on 03-22-11 She has been grieving - better now Her dtr is sick with the neck pain  Wt Readings from Last 3 Encounters:  10/05/12 143 lb (64.864 kg)  05/26/12 146 lb (66.225 kg)  02/25/12 144 lb (65.318 kg)   BP Readings from Last 3 Encounters:  10/05/12 148/86  05/26/12 140/90  02/25/12 144/98      Review of Systems  Constitutional: Positive for fatigue. Negative for chills, activity change, appetite change and unexpected weight change.  HENT: Negative for congestion, mouth sores and sinus pressure.   Eyes: Negative for visual disturbance.  Respiratory: Negative for cough and chest tightness.   Gastrointestinal: Negative for nausea and abdominal pain.  Genitourinary: Negative for frequency, difficulty urinating and vaginal pain.  Musculoskeletal: Negative for back pain and gait problem.  Skin: Negative for pallor and rash.  Neurological: Negative for dizziness, tremors, weakness, numbness and headaches.  Psychiatric/Behavioral: Negative for confusion and sleep disturbance.       Objective:   Physical Exam  Constitutional: She appears well-developed and well-nourished. No distress.  HENT:  Head: Normocephalic.  Right Ear: External ear normal.  Left Ear: External ear normal.  Nose: Nose normal.  Mouth/Throat: Oropharynx is clear and moist.  Eyes: Conjunctivae are normal. Pupils are equal, round, and reactive to light. Right eye exhibits no discharge. Left eye exhibits no discharge.  Neck: Normal range of motion. Neck supple. No JVD present. No tracheal deviation present. No thyromegaly present.  Cardiovascular: Normal rate, regular rhythm and normal heart  sounds.   Pulmonary/Chest: No stridor. No respiratory distress. She has no wheezes.  Abdominal: Soft. Bowel sounds are normal. She exhibits no distension and no mass. There is no tenderness. There is no rebound and no guarding.  Musculoskeletal: She exhibits no edema and no tenderness.  Lymphadenopathy:    She has no cervical adenopathy.  Neurological: She displays normal reflexes. No cranial nerve deficit. She exhibits normal muscle tone. Coordination normal.  Skin: No rash noted. No erythema.  Psychiatric: She has a normal mood and affect. Her behavior is normal. Judgment and thought content normal.     Lab Results  Component Value Date   WBC 4.1 10/09/2011   HGB 14.4 10/09/2011   HCT 41.7 10/09/2011   PLT 227 10/09/2011   GLUCOSE 111* 02/25/2012   CHOL 270* 10/30/2010   TRIG 263.0* 10/30/2010   HDL 39.10 10/30/2010   LDLDIRECT 173.3 10/30/2010   LDLCALC 122* 12/04/2007   ALT 20 10/09/2011   AST 22 10/09/2011   NA 144 02/25/2012   K 4.9 02/25/2012   CL 108 02/25/2012   CREATININE 1.1 02/25/2012   BUN 20 02/25/2012   CO2 28 02/25/2012   TSH 3.61 02/25/2012   HGBA1C 5.4 12/05/2010           Assessment & Plan:

## 2012-10-05 NOTE — Assessment & Plan Note (Signed)
Benadryl prn 

## 2012-10-05 NOTE — Assessment & Plan Note (Addendum)
Continue with current prescription therapy as reflected on the Med list. Re-start amlodipine

## 2012-10-06 LAB — VITAMIN D 25 HYDROXY (VIT D DEFICIENCY, FRACTURES): Vit D, 25-Hydroxy: 40 ng/mL (ref 30–89)

## 2012-10-26 ENCOUNTER — Other Ambulatory Visit: Payer: Self-pay | Admitting: Internal Medicine

## 2012-12-17 ENCOUNTER — Other Ambulatory Visit: Payer: Self-pay

## 2013-01-12 ENCOUNTER — Other Ambulatory Visit: Payer: Self-pay | Admitting: Internal Medicine

## 2013-02-08 ENCOUNTER — Ambulatory Visit: Payer: BC Managed Care – PPO | Admitting: Internal Medicine

## 2013-02-16 ENCOUNTER — Encounter: Payer: Self-pay | Admitting: Internal Medicine

## 2013-02-16 ENCOUNTER — Other Ambulatory Visit (INDEPENDENT_AMBULATORY_CARE_PROVIDER_SITE_OTHER): Payer: BC Managed Care – PPO

## 2013-02-16 ENCOUNTER — Ambulatory Visit (INDEPENDENT_AMBULATORY_CARE_PROVIDER_SITE_OTHER): Payer: BC Managed Care – PPO | Admitting: Internal Medicine

## 2013-02-16 VITALS — BP 120/80 | HR 80 | Temp 97.6°F | Resp 16 | Wt 139.0 lb

## 2013-02-16 DIAGNOSIS — E785 Hyperlipidemia, unspecified: Secondary | ICD-10-CM

## 2013-02-16 DIAGNOSIS — J309 Allergic rhinitis, unspecified: Secondary | ICD-10-CM

## 2013-02-16 DIAGNOSIS — E538 Deficiency of other specified B group vitamins: Secondary | ICD-10-CM

## 2013-02-16 DIAGNOSIS — F329 Major depressive disorder, single episode, unspecified: Secondary | ICD-10-CM

## 2013-02-16 DIAGNOSIS — F3289 Other specified depressive episodes: Secondary | ICD-10-CM

## 2013-02-16 DIAGNOSIS — I10 Essential (primary) hypertension: Secondary | ICD-10-CM

## 2013-02-16 DIAGNOSIS — E559 Vitamin D deficiency, unspecified: Secondary | ICD-10-CM

## 2013-02-16 LAB — HEPATIC FUNCTION PANEL
ALT: 19 U/L (ref 0–35)
AST: 26 U/L (ref 0–37)
Albumin: 4.7 g/dL (ref 3.5–5.2)
Alkaline Phosphatase: 91 U/L (ref 39–117)
BILIRUBIN DIRECT: 0.2 mg/dL (ref 0.0–0.3)
BILIRUBIN TOTAL: 1 mg/dL (ref 0.3–1.2)
Total Protein: 8.5 g/dL — ABNORMAL HIGH (ref 6.0–8.3)

## 2013-02-16 LAB — BASIC METABOLIC PANEL
BUN: 17 mg/dL (ref 6–23)
CALCIUM: 9.6 mg/dL (ref 8.4–10.5)
CO2: 28 mEq/L (ref 19–32)
Chloride: 103 mEq/L (ref 96–112)
Creatinine, Ser: 1.4 mg/dL — ABNORMAL HIGH (ref 0.4–1.2)
GFR: 40.41 mL/min — ABNORMAL LOW (ref >60.00)
GLUCOSE: 103 mg/dL — AB (ref 70–99)
Potassium: 3.8 mEq/L (ref 3.5–5.1)
SODIUM: 140 meq/L (ref 135–145)

## 2013-02-16 LAB — TSH: TSH: 2.03 u[IU]/mL (ref 0.35–5.50)

## 2013-02-16 MED ORDER — METHYLPREDNISOLONE ACETATE 80 MG/ML IJ SUSP
80.0000 mg | Freq: Once | INTRAMUSCULAR | Status: AC
  Administered 2013-02-16: 80 mg via INTRAMUSCULAR

## 2013-02-16 MED ORDER — AMLODIPINE BESYLATE 2.5 MG PO TABS: 2.5000 mg | ORAL_TABLET | Freq: Every day | ORAL | Status: DC

## 2013-02-16 MED ORDER — FLUTICASONE PROPIONATE 50 MCG/ACT NA SUSP: 2.0000 | Freq: Every day | NASAL | Status: DC

## 2013-02-16 MED ORDER — CYANOCOBALAMIN 1000 MCG/ML IJ SOLN: 1000.0000 ug | INTRAMUSCULAR | Status: DC

## 2013-02-16 MED ORDER — CYANOCOBALAMIN 1000 MCG/ML IJ SOLN
1000.0000 ug | Freq: Once | INTRAMUSCULAR | Status: AC
  Administered 2013-02-16: 1000 ug via INTRAMUSCULAR

## 2013-02-16 MED ORDER — LOSARTAN POTASSIUM 100 MG PO TABS: ORAL_TABLET | ORAL | Status: DC

## 2013-02-16 NOTE — Assessment & Plan Note (Addendum)
Zyrtec Flonase Lab tests Depomedrol 80 mg/d

## 2013-02-16 NOTE — Assessment & Plan Note (Signed)
Continue with current prescription therapy as reflected on the Med list.  

## 2013-02-16 NOTE — Progress Notes (Signed)
   Subjective:    HPI  C/o allergies - bad runny nose  The patient presents for a follow-up of  chronic hypertension, depression, chronic dyslipidemia, type 2 pre-diabetes controlled with medicines, fatigue; B12 and vit D def.   [name] [name] changed diet and decided to see how she would do w/o Crestor... Not taking Norvasc 2022/10/06)  Her mother died on 03-23-2011 She has been grieving - better now Her dtr is sick with the neck pain  Wt Readings from Last 3 Encounters:  02/16/13 139 lb (63.05 kg)  10/05/12 143 lb (64.864 kg)  05/26/12 146 lb (66.225 kg)   BP Readings from Last 3 Encounters:  02/16/13 120/80  10/05/12 148/86  05/26/12 140/90      Review of Systems  Constitutional: Positive for fatigue. Negative for chills, activity change, appetite change and unexpected weight change.  HENT: Negative for congestion, mouth sores and sinus pressure.   Eyes: Negative for visual disturbance.  Respiratory: Negative for cough and chest tightness.   Gastrointestinal: Negative for nausea and abdominal pain.  Genitourinary: Negative for frequency, difficulty urinating and vaginal pain.  Musculoskeletal: Negative for back pain and gait problem.  Skin: Negative for pallor and rash.  Neurological: Negative for dizziness, tremors, weakness, numbness and headaches.  Psychiatric/Behavioral: Negative for confusion and sleep disturbance.       Objective:   Physical Exam  Constitutional: She appears well-developed and well-nourished. No distress.  HENT:  Head: Normocephalic.  Right Ear: External ear normal.  Left Ear: External ear normal.  Nose: Nose normal.  Mouth/Throat: Oropharynx is clear and moist.  Eyes: Conjunctivae are normal. Pupils are equal, round, and reactive to light. Right eye exhibits no discharge. Left eye exhibits no discharge.  Neck: Normal range of motion. Neck supple. No JVD present. No tracheal deviation present. No thyromegaly present.  Cardiovascular: Normal rate,  regular rhythm and normal heart sounds.   Pulmonary/Chest: No stridor. No respiratory distress. She has no wheezes.  Abdominal: Soft. Bowel sounds are normal. She exhibits no distension and no mass. There is no tenderness. There is no rebound and no guarding.  Musculoskeletal: She exhibits no edema and no tenderness.  Lymphadenopathy:    She has no cervical adenopathy.  Neurological: She displays normal reflexes. No cranial nerve deficit. She exhibits normal muscle tone. Coordination normal.  Skin: No rash noted. No erythema.  Psychiatric: She has a normal mood and affect. Her behavior is normal. Judgment and thought content normal.  eyth nasal lining   Lab Results  Component Value Date   WBC 4.1 10/09/2011   HGB 14.4 10/09/2011   HCT 41.7 10/09/2011   PLT 227 10/09/2011   GLUCOSE 105* 10/05/2012   CHOL 269* 10/05/2012   TRIG 318.0* 10/05/2012   HDL 32.20* 10/05/2012   LDLDIRECT 171.5 10/05/2012   LDLCALC 122* 12/04/2007   ALT 20 10/05/2012   AST 24 10/05/2012   NA 137 10/05/2012   K 4.1 10/05/2012   CL 103 10/05/2012   CREATININE 1.1 10/05/2012   BUN 15 10/05/2012   CO2 29 10/05/2012   TSH 2.61 10/05/2012   HGBA1C 5.4 12/05/2010           Assessment & Plan:

## 2013-02-16 NOTE — Progress Notes (Signed)
Pre visit review using our clinic review tool, if applicable. No additional management support is needed unless otherwise documented below in the visit note. 

## 2013-02-16 NOTE — Assessment & Plan Note (Signed)
Labs On Crestor now

## 2013-02-17 LAB — ALLERGY PROFILE REGION II-DC, DE, MD, ~~LOC~~, VA
Allergen, D pternoyssinus,d7: 0.22 kU/L — ABNORMAL HIGH
Alternaria Alternata: <0.1 kU/L
Aspergillus fumigatus, m3: <0.1 kU/L
Bermuda Grass: <0.1 kU/L
Box Elder IgE: <0.1 kU/L
Cladosporium Herbarum: <0.1 kU/L
D. farinae: 0.32 kU/L — ABNORMAL HIGH
Lamb's Quarters: <0.1 kU/L
Meadow Grass: <0.1 kU/L
Oak: <0.1 kU/L

## 2013-02-17 LAB — ALLERGEN FOOD PROFILE SPECIFIC IGE
Egg White IgE: <0.1 kU/L
Fish Cod: <0.1 kU/L
IgE (Immunoglobulin E), Serum: 6 IU/mL (ref 0.0–180.0)
Milk IgE: <0.1 kU/L
Orange: <0.1 kU/L
Peanut IgE: <0.1 kU/L
Shrimp IgE: <0.1 kU/L
Tuna IgE: <0.1 kU/L
Wheat IgE: <0.1 kU/L

## 2013-03-24 ENCOUNTER — Other Ambulatory Visit: Payer: Self-pay

## 2013-03-29 ENCOUNTER — Ambulatory Visit: Payer: BC Managed Care – PPO | Admitting: Internal Medicine

## 2013-04-06 ENCOUNTER — Other Ambulatory Visit: Payer: Self-pay

## 2013-04-06 DIAGNOSIS — Z1231 Encounter for screening mammogram for malignant neoplasm of breast: Secondary | ICD-10-CM

## 2013-05-05 ENCOUNTER — Ambulatory Visit: Payer: BC Managed Care – PPO

## 2013-05-10 ENCOUNTER — Ambulatory Visit
Admission: RE | Admit: 2013-05-10 | Discharge: 2013-05-10 | Disposition: A | Discharge status: Home / Self Care | Payer: BC Managed Care – PPO | Source: Ambulatory Visit

## 2013-05-10 DIAGNOSIS — Z1231 Encounter for screening mammogram for malignant neoplasm of breast: Secondary | ICD-10-CM

## 2013-05-18 ENCOUNTER — Ambulatory Visit: Payer: BC Managed Care – PPO | Admitting: Internal Medicine

## 2013-06-07 ENCOUNTER — Ambulatory Visit (INDEPENDENT_AMBULATORY_CARE_PROVIDER_SITE_OTHER): Payer: BC Managed Care – PPO | Admitting: Physician Assistant

## 2013-06-07 VITALS — BP 110/68 | HR 89 | Temp 98.1°F | Resp 16 | Ht 64.0 in | Wt 144.0 lb

## 2013-06-07 DIAGNOSIS — L299 Pruritus, unspecified: Secondary | ICD-10-CM

## 2013-06-07 DIAGNOSIS — L255 Unspecified contact dermatitis due to plants, except food: Secondary | ICD-10-CM

## 2013-06-07 DIAGNOSIS — L237 Allergic contact dermatitis due to plants, except food: Secondary | ICD-10-CM

## 2013-06-07 MED ORDER — METHYLPREDNISOLONE ACETATE 80 MG/ML IJ SUSP
80.0000 mg | Freq: Once | INTRAMUSCULAR | Status: AC
  Administered 2013-06-07: 80 mg via INTRAMUSCULAR

## 2013-06-07 NOTE — Progress Notes (Signed)
Subjective:    Patient ID: [name] [name], female    DOB: 1940/10/06, 73 y.o.   MRN: [id number]  HPI Primary Physician: Walker Kehr, MD  Chief Complaint: Poison oak  HPI: 73 y.o. female with history below presents with 4 day history of pruritic rash that initially began along her right hand and spread up her arm to her chest then around her left eye. Patient was out in the "natural area" pulling some weeds prior to the development of the rash. There is some known poison oak or ivy in that area. She is quite prone to getting this. She requests a shot at this time. She tolerates steroid shots well and usually has to get them once per year. She has washed all of her clothes that were exposed, however she has not yet washed her dog and the dog was out with her at the time she was pulling the weeds.    Past Medical History  Diagnosis Date  . Thyroid cancer     Papillary Stage 1 - Dr Loanne Drilling  . S/P thyroidectomy 07/2005    2 cm largest diameter (1 other tiny focus)/ i-131 rx 99 [facility] 08/2005  . Hyperlipidemia   . Hypothyroidism   . Breast cancer hx 2004    recurrent 2006 DR Magrinat  . Vitamin B12 deficiency 2009  . Vitamin D deficiency 2009  . Anxiety   . Allergic rhinitis   . Psoriasis   . HTN (hypertension)      Home Meds: Prior to Admission medications   Medication Sig Start Date End Date Taking? Authorizing Provider  ALPRAZolam (XANAX) 0.25 MG tablet Take 0.25 mg by mouth 2 (two) times daily as needed.     Yes Historical Provider, MD  amLODipine (NORVASC) 2.5 MG tablet Take 1 tablet (2.5 mg total) by mouth daily. 02/16/13  Yes Evie Lacks Plotnikov, MD  cyanocobalamin (,VITAMIN B-12,) 1000 MCG/ML injection Inject 1 mL (1,000 mcg total) into the muscle every 30 (thirty) days. 1 ml q 1 wk x 4, then monthly 02/16/13  Yes Evie Lacks Plotnikov, MD  desvenlafaxine (PRISTIQ) 50 MG 24 hr tablet Take 50 mg by mouth daily.     Yes Historical Provider, MD  levothyroxine (SYNTHROID, LEVOTHROID) 88  MCG tablet TAKE 1 TABLET (88 MCG TOTAL) BY MOUTH DAILY. 10/26/12  Yes Evie Lacks Plotnikov, MD  losartan (COZAAR) 100 MG tablet TAKE 1 TABLET BY MOUTH EVERY DAY FOR BLOOD PRESSURE 02/16/13  Yes Evie Lacks Plotnikov, MD  rosuvastatin (CRESTOR) 20 MG tablet Take 1 tablet (20 mg total) by mouth daily. 07/16/12  Yes Evie Lacks Plotnikov, MD  aspirin 81 MG EC tablet Take 81 mg by mouth daily.      Historical Provider, MD  cetirizine (ZYRTEC) 10 MG tablet Take 10 mg by mouth daily.    Historical Provider, MD  cholecalciferol (VITAMIN D) 1000 UNITS tablet Take 2 tablets (2,000 Units total) by mouth daily. 02/26/12 02/25/13  Evie Lacks Plotnikov, MD  fluticasone (FLONASE) 50 MCG/ACT nasal spray Place 2 sprays into both nostrils daily. 02/16/13   Evie Lacks Plotnikov, MD  mometasone (ELOCON) 0.1 % ointment Apply topically 2 (two) times daily as needed.      Historical Provider, MD  Syringe/Needle, Disp, (B-D ECLIPSE SYRINGE) 30G X 1/2" 1 ML MISC 1 each by Does not apply route 1 day or 1 dose. For Vit B12 inj 11/06/10   [name], MD    Allergies:  Allergies  Allergen Reactions  . Sulfacetamide  Sodium-Sulfur     History   Social History  . Marital Status: Married    Spouse Name: N/A    Number of Children: 2  . Years of Education: N/A   Occupational History  . 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 Smoking status: Never Smoker   . Smokeless tobacco: Not on file  . Alcohol Use: No  . Drug Use: No  . Sexual Activity: Not Currently   Other Topics Concern  . Not on file   Social History Narrative   GI - Dr Earlean Shawl   Depression - Dr Toy Care   GYN - Dr Marianna Payment           Review of Systems  Constitutional: Negative for fever, chills and fatigue.  Eyes: Negative for photophobia, pain, discharge, redness, itching and visual disturbance.  Respiratory: Negative for cough, choking, chest tightness, shortness of breath, wheezing and stridor.     Cardiovascular: Negative for chest pain.  Skin: Positive for rash. Negative for wound.       Objective:   Physical Exam  Physical Exam: Blood pressure 110/68, pulse 89, temperature 98.1 F (36.7 C), temperature source Oral, resp. rate 16, height 5\' 4"  (1.626 m), weight 144 lb (65.318 kg), SpO2 99.00%., Body mass index is 24.71 kg/(m^2). General: Well developed, well nourished, in no acute distress. Head: Normocephalic, atraumatic, eyes without discharge, sclera non-icteric, nares are without discharge. Bilateral auditory canals clear, TM's are without perforation, pearly grey and translucent with reflective cone of light bilaterally. Oral cavity moist, posterior pharynx without exudate, erythema, peritonsillar abscess, or post nasal drip. Uvula midline.   Neck: Supple. No thyromegaly. Full ROM. No lymphadenopathy. Lungs: Clear bilaterally to auscultation without wheezes, rales, or rhonchi. Breathing is unlabored. Heart: RRR with S1 S2. No murmurs, rubs, or gallops appreciated. Msk:  Strength and tone normal for age. Extremities/Skin: Warm and dry. No clubbing or cyanosis. No edema. Multiple mildly erythematous lesions along the right hand, arm, upper anterior chest, and around the left eye. The left eye is not swollen shut at this time.  Neuro: Alert and oriented X 3. Moves all extremities spontaneously. Gait is normal. CNII-XII grossly in tact. Psych:  Responds to questions appropriately with a normal affect.        Assessment & Plan:  73 year old female with poison ivy -DepoMedrol 80 mg IM -Has mometasone 0.1% ointment at home to use, advised not to use on the face -Wash the dog -RTC prn   Christell Faith, MHS, PA-C [facility] [address] [place], [place] [address] [place] 06/07/2013 9:26 PM

## 2013-08-10 ENCOUNTER — Other Ambulatory Visit: Payer: Self-pay | Admitting: Internal Medicine

## 2013-08-19 ENCOUNTER — Ambulatory Visit: Payer: BC Managed Care – PPO | Admitting: Internal Medicine

## 2013-08-20 ENCOUNTER — Ambulatory Visit (INDEPENDENT_AMBULATORY_CARE_PROVIDER_SITE_OTHER): Payer: BC Managed Care – PPO | Admitting: Family Medicine

## 2013-08-20 VITALS — BP 120/76 | HR 102 | Temp 98.7°F | Resp 18 | Ht 64.0 in | Wt 143.0 lb

## 2013-08-20 DIAGNOSIS — H9202 Otalgia, left ear: Secondary | ICD-10-CM

## 2013-08-20 DIAGNOSIS — R599 Enlarged lymph nodes, unspecified: Secondary | ICD-10-CM

## 2013-08-20 DIAGNOSIS — H9209 Otalgia, unspecified ear: Secondary | ICD-10-CM

## 2013-08-20 DIAGNOSIS — R590 Localized enlarged lymph nodes: Secondary | ICD-10-CM

## 2013-08-20 DIAGNOSIS — J0100 Acute maxillary sinusitis, unspecified: Secondary | ICD-10-CM

## 2013-08-20 MED ORDER — OFLOXACIN 0.3 % OT SOLN: 5.0000 [drp] | Freq: Every day | OTIC | Status: DC

## 2013-08-20 MED ORDER — AMOXICILLIN 875 MG PO TABS: 875.0000 mg | ORAL_TABLET | Freq: Two times a day (BID) | ORAL | Status: DC

## 2013-08-20 NOTE — Progress Notes (Signed)
Subjective: Pleasant 73 year old lady who's been having problems with a respiratory tract infection for several days. She has had facial discomfort in the frontal and maxillary regions. She has been coughing. She has developed left ear pain. It hurts when she moves her left earlobe around. It is tender just behind the ear. She has a tender node under the left side of her chin. She has tried to use a Q-tip in her ear but cannot get relief.  Objective: Pleasant lady in no major distress. Right TM is normal. Left has a small amount of wax lining the canal. Drum is visualized and is normal pearly quite. She has tenderness and pain on movement of the auricle. She is a little tender behind the ear. She is tender node under the left submandibular area. Chest is clear to auscultation. Heart regular without murmurs.  Assessment: Upper respiratory infection with sinusitis and lymphadenitis Left otalgia, possibly from an early otitis externa  Plan: Ofloxacin ear drops Amoxicillin 875 twice daily She or he has some Flonase She continued using some Tessalon pearls she had.  Return if worse or not improving

## 2013-08-20 NOTE — Patient Instructions (Signed)
Use the ear drops 5 drops twice daily left ear  Take amoxicillin 875 twice daily  Take Tylenol or ibuprofen if needed for pain  Use the fluticasone nose spray that you half twice daily for 3 days, then once daily  Return if worse or not improving

## 2013-10-17 ENCOUNTER — Other Ambulatory Visit: Payer: Self-pay | Admitting: Internal Medicine

## 2013-11-29 ENCOUNTER — Other Ambulatory Visit: Payer: Self-pay | Admitting: Internal Medicine

## 2014-02-14 ENCOUNTER — Other Ambulatory Visit: Payer: Self-pay | Admitting: Internal Medicine

## 2014-03-02 ENCOUNTER — Ambulatory Visit: Payer: Self-pay | Admitting: Family

## 2014-03-02 ENCOUNTER — Encounter: Payer: Self-pay | Admitting: Family

## 2014-03-02 ENCOUNTER — Ambulatory Visit (INDEPENDENT_AMBULATORY_CARE_PROVIDER_SITE_OTHER): Payer: BLUE CROSS/BLUE SHIELD | Admitting: Family

## 2014-03-02 VITALS — BP 168/98 | HR 89 | Temp 97.9°F | Resp 18 | Ht 65.0 in | Wt 147.0 lb

## 2014-03-02 DIAGNOSIS — J0100 Acute maxillary sinusitis, unspecified: Secondary | ICD-10-CM

## 2014-03-02 MED ORDER — LEVOFLOXACIN 500 MG PO TABS: 500.0000 mg | ORAL_TABLET | Freq: Every day | ORAL | Status: DC

## 2014-03-02 NOTE — Progress Notes (Signed)
Subjective:    Patient ID: [name] [name], female    DOB: 1940/05/22, 74 y.o.   MRN: [id number]  Chief Complaint  Patient presents with  . Otalgia    both ears hurt, teeth hurt, sore throat, drainage x2 days    HPI:  [name] [name] is a 74 y.o. female who presents today for an acute visit.   Acute illness started about 2 days ago with the associated symptoms of bilateral ear pain, dental pain, sore throat and drainage. Notes cough is worse in the morning. Modifying factors include Tessalon pearles, robutussin and a steam vaporizer. Has used amoxicillin yesterday from a left over treatment.    Allergies  Allergen Reactions  . Sulfacetamide Sodium-Sulfur     Current Outpatient Prescriptions on File Prior to Visit  Medication Sig Dispense Refill  . ALPRAZolam (XANAX) 0.25 MG tablet Take 0.25 mg by mouth 2 (two) times daily as needed.      [name] amLODipine (NORVASC) 2.5 MG tablet Take 1 tablet (2.5 mg total) by mouth daily. 90 tablet 3  . amoxicillin (AMOXIL) 875 MG tablet Take 1 tablet (875 mg total) by mouth 2 (two) times daily. 20 tablet 0  . aspirin 81 MG EC tablet Take 81 mg by mouth daily.      . cetirizine (ZYRTEC) 10 MG tablet Take 10 mg by mouth daily.    . CRESTOR 20 MG tablet TAKE 1 TABLET BY MOUTH DAILY 90 tablet 0  . cyanocobalamin (,VITAMIN B-12,) 1000 MCG/ML injection Inject 1 mL (1,000 mcg total) into the muscle every 30 (thirty) days. 1 ml q 1 wk x 4, then monthly 10 mL 6  . desvenlafaxine (PRISTIQ) 50 MG 24 hr tablet Take 50 mg by mouth daily.      . fluticasone (FLONASE) 50 MCG/ACT nasal spray Place 2 sprays into both nostrils daily. 16 g 6  . levothyroxine (SYNTHROID, LEVOTHROID) 88 MCG tablet TAKE 1 TABLET (88 MCG TOTAL) BY MOUTH DAILY. 90 tablet 3  . losartan (COZAAR) 100 MG tablet TAKE 1 TABLET BY MOUTH EVERY DAY FOR BLOOD PRESSURE 90 tablet 3  . Syringe/Needle, Disp, (B-D ECLIPSE SYRINGE) 30G X 1/2" 1 ML MISC 1 each by Does not apply route 1 day or 1 dose. For  Vit B12 inj 50 each 11  . cholecalciferol (VITAMIN D) 1000 UNITS tablet Take 2 tablets (2,000 Units total) by mouth daily. 100 tablet 3   No current facility-administered medications on file prior to visit.    Review of Systems  Constitutional: Negative for fever and chills.  HENT: Positive for dental problem, ear pain, sinus pressure and sore throat.   Respiratory: Positive for cough. Negative for chest tightness and shortness of breath.   Neurological: Negative for headaches.      Objective:    BP 168/98 mmHg  Pulse 89  Temp(Src) 97.9 F (36.6 C) (Oral)  Resp 18  Ht 5\' 5"  (1.651 m)  Wt 147 lb (66.679 kg)  BMI 24.46 kg/m2  SpO2 98% Nursing note and vital signs reviewed.  Physical Exam  Constitutional: She is oriented to person, place, and time. She appears well-developed and well-nourished. No distress.  HENT:  Right Ear: Hearing, tympanic membrane, external ear and ear canal normal.  Left Ear: Hearing, tympanic membrane, external ear and ear canal normal.  Nose: Right sinus exhibits maxillary sinus tenderness. Right sinus exhibits no frontal sinus tenderness. Left sinus exhibits maxillary sinus tenderness. Left sinus exhibits no frontal sinus tenderness.  Mouth/Throat: Uvula is  midline, oropharynx is clear and moist and mucous membranes are normal.  Eyes: Conjunctivae are normal. Pupils are equal, round, and reactive to light.  Neck: Neck supple.  Cardiovascular: Normal rate, regular rhythm, normal heart sounds and intact distal pulses.   Pulmonary/Chest: Effort normal and breath sounds normal.  Lymphadenopathy:    She has no cervical adenopathy.  Neurological: She is alert and oriented to person, place, and time.  Skin: Skin is warm and dry.  Psychiatric: She has a normal mood and affect. Her behavior is normal. Judgment and thought content normal.       Assessment & Plan:

## 2014-03-02 NOTE — Patient Instructions (Addendum)
Thank you for choosing [facility].  Summary/Instructions:  Your prescription(s) have been submitted to your pharmacy or been printed and provided for you. Please take as directed and contact our office if you believe you are having problem(s) with the medication(s) or have any questions.  If your symptoms worsen or fail to improve, please contact our office for further instruction, or in case of emergency go directly to the emergency room at the closest medical facility.    To prevent wax buildup within the ear:   Use equal parts of water and white vinegar  Soak a cotton ball in the solution and place several drops within the ear  Insert cotton ball in external ear canal and let sit for 30 minutes prior to shower  Remove cotton ball and gently irrigate the ear canal in the shower.  Do not irrigate directly into the ear but rather let it hit the external canal and irrigate.  For maintenance, this can be done 1 time weekly.   General Recommendations:    Please drink plenty of fluids.  Get plenty of rest   Sleep in humidified air  Use saline nasal sprays  Netti pot   OTC Medications:  Decongestants - helps relieve congestion   Flonase (generic fluticasone) or Nasacort (generic triamcinolone) - please make sure to use the "cross-over" technique at a 45 degree angle towards the opposite eye as opposed to straight up the nasal passageway.   Sudafed (generic pseudoephedrine - Note this is the one that is available behind the pharmacy counter); Products with phenylephrine (-PE) may also be used but is often not as effective as pseudoephedrine.   If you have HIGH BLOOD PRESSURE - Coricidin HBP; AVOID any product that is -D as this contains pseudoephedrine which may increase your blood pressure.  Afrin (oxymetazoline) every 6-8 hours for up to 3 days.   Allergies - helps relieve runny nose, itchy eyes and sneezing   Claritin (generic loratidine), Allegra  (fexofenidine), or Zyrtec (generic cyrterizine) for runny nose. These medications should not cause drowsiness.  Note - Benadryl (generic diphenhydramine) may be used however may cause drowsiness  Cough -   Delsym or Robitussin (generic dextromethorphan)  Expectorants - helps loosen mucus to ease removal   Mucinex (generic guaifenesin) as directed on the package.  Headaches / General Aches   Tylenol (generic acetaminophen) - DO NOT EXCEED 3 grams (3,000 mg) in a 24 hour time period  Advil/Motrin (generic ibuprofen)   Sore Throat -   Salt water gargle   Chloraseptic (generic benzocaine) spray or lozenges / Sucrets (generic dyclonine)

## 2014-03-02 NOTE — Assessment & Plan Note (Signed)
Symptoms and exam consistent with maxillary sinusitis. Start Levaquin 7 days. Discussed and reviewed over-the-counter medications as needed for symptom relief. Patient instructed to avoid anything with pseudoephedrine in it. Follow-up if symptoms worsen or fail to improve.

## 2014-03-02 NOTE — Progress Notes (Signed)
Pre visit review using our clinic review tool, if applicable. No additional management support is needed unless otherwise documented below in the visit note. 

## 2014-03-14 ENCOUNTER — Ambulatory Visit: Payer: BLUE CROSS/BLUE SHIELD | Admitting: Internal Medicine

## 2014-03-15 ENCOUNTER — Other Ambulatory Visit: Payer: Self-pay | Admitting: *Deleted

## 2014-03-15 ENCOUNTER — Other Ambulatory Visit (INDEPENDENT_AMBULATORY_CARE_PROVIDER_SITE_OTHER): Payer: BLUE CROSS/BLUE SHIELD

## 2014-03-15 DIAGNOSIS — E785 Hyperlipidemia, unspecified: Secondary | ICD-10-CM

## 2014-03-15 DIAGNOSIS — Z0000 Encounter for general adult medical examination without abnormal findings: Secondary | ICD-10-CM

## 2014-03-15 LAB — URINALYSIS, ROUTINE W REFLEX MICROSCOPIC
Nitrite: NEGATIVE
Specific Gravity, Urine: >=1.03 — AB (ref 1.000–1.030)
TOTAL PROTEIN, URINE-UPE24: 30 — AB
URINE GLUCOSE: NEGATIVE
Urobilinogen, UA: 0.2 (ref 0.0–1.0)
pH: 5.5 (ref 5.0–8.0)

## 2014-03-15 LAB — LDL CHOLESTEROL, DIRECT: Direct LDL: 82 mg/dL

## 2014-03-15 LAB — CBC WITH DIFFERENTIAL/PLATELET
BASOS PCT: 0.3 % (ref 0.0–3.0)
Basophils Absolute: 0 10*3/uL (ref 0.0–0.1)
EOS PCT: 1.5 % (ref 0.0–5.0)
Eosinophils Absolute: 0.1 10*3/uL (ref 0.0–0.7)
HCT: 43.9 % (ref 36.0–46.0)
Hemoglobin: 15.1 g/dL — ABNORMAL HIGH (ref 12.0–15.0)
Lymphocytes Relative: 41.7 % (ref 12.0–46.0)
Lymphs Abs: 2.5 10*3/uL (ref 0.7–4.0)
MCHC: 34.3 g/dL (ref 30.0–36.0)
MCV: 90.2 fl (ref 78.0–100.0)
Monocytes Absolute: 0.5 10*3/uL (ref 0.1–1.0)
Monocytes Relative: 7.8 % (ref 3.0–12.0)
Neutro Abs: 2.9 10*3/uL (ref 1.4–7.7)
Neutrophils Relative %: 48.7 % (ref 43.0–77.0)
Platelets: 303 10*3/uL (ref 150.0–400.0)
RBC: 4.87 Mil/uL (ref 3.87–5.11)
RDW: 13.3 % (ref 11.5–15.5)
WBC: 5.9 10*3/uL (ref 4.0–10.5)

## 2014-03-15 LAB — BASIC METABOLIC PANEL
BUN: 17 mg/dL (ref 6–23)
CHLORIDE: 104 meq/L (ref 96–112)
CO2: 29 meq/L (ref 19–32)
CREATININE: 1.18 mg/dL (ref 0.40–1.20)
Calcium: 10 mg/dL (ref 8.4–10.5)
GFR: 47.46 mL/min — AB (ref >60.00)
GLUCOSE: 122 mg/dL — AB (ref 70–99)
Potassium: 4.4 mEq/L (ref 3.5–5.1)
Sodium: 140 mEq/L (ref 135–145)

## 2014-03-15 LAB — HEPATIC FUNCTION PANEL
ALBUMIN: 4.4 g/dL (ref 3.5–5.2)
ALK PHOS: 104 U/L (ref 39–117)
ALT: 14 U/L (ref 0–35)
AST: 22 U/L (ref 0–37)
Bilirubin, Direct: 0.1 mg/dL (ref 0.0–0.3)
TOTAL PROTEIN: 7.4 g/dL (ref 6.0–8.3)
Total Bilirubin: 0.7 mg/dL (ref 0.2–1.2)

## 2014-03-15 LAB — LIPID PANEL
Cholesterol: 157 mg/dL (ref 0–200)
HDL: 41.8 mg/dL (ref >39.00)
NONHDL: 115.2
Total CHOL/HDL Ratio: 4
Triglycerides: 303 mg/dL — ABNORMAL HIGH (ref 0.0–149.0)
VLDL: 60.6 mg/dL — ABNORMAL HIGH (ref 0.0–40.0)

## 2014-03-15 LAB — VITAMIN D 25 HYDROXY (VIT D DEFICIENCY, FRACTURES): VITD: 30.63 ng/mL (ref 30.00–100.00)

## 2014-03-15 LAB — VITAMIN B12: Vitamin B-12: 242 pg/mL (ref 211–911)

## 2014-03-15 LAB — TSH: TSH: 10.67 u[IU]/mL — ABNORMAL HIGH (ref 0.35–4.50)

## 2014-03-16 ENCOUNTER — Encounter: Payer: Self-pay | Admitting: Internal Medicine

## 2014-03-16 ENCOUNTER — Ambulatory Visit (INDEPENDENT_AMBULATORY_CARE_PROVIDER_SITE_OTHER): Payer: BLUE CROSS/BLUE SHIELD | Admitting: Internal Medicine

## 2014-03-16 VITALS — BP 120/86 | HR 84 | Temp 97.7°F | Ht 64.5 in | Wt 148.0 lb

## 2014-03-16 DIAGNOSIS — M25512 Pain in left shoulder: Secondary | ICD-10-CM

## 2014-03-16 DIAGNOSIS — E538 Deficiency of other specified B group vitamins: Secondary | ICD-10-CM

## 2014-03-16 DIAGNOSIS — I10 Essential (primary) hypertension: Secondary | ICD-10-CM

## 2014-03-16 DIAGNOSIS — E559 Vitamin D deficiency, unspecified: Secondary | ICD-10-CM

## 2014-03-16 DIAGNOSIS — E890 Postprocedural hypothyroidism: Secondary | ICD-10-CM

## 2014-03-16 DIAGNOSIS — R5382 Chronic fatigue, unspecified: Secondary | ICD-10-CM

## 2014-03-16 DIAGNOSIS — L570 Actinic keratosis: Secondary | ICD-10-CM

## 2014-03-16 DIAGNOSIS — Z0000 Encounter for general adult medical examination without abnormal findings: Secondary | ICD-10-CM

## 2014-03-16 MED ORDER — LEVOTHYROXINE SODIUM 112 MCG PO TABS
112.0000 ug | ORAL_TABLET | Freq: Every day | ORAL | Status: DC
Start: 2014-03-16 — End: 2014-05-17

## 2014-03-16 MED ORDER — CYANOCOBALAMIN 1000 MCG/ML IJ SOLN: 1000.0000 ug | INTRAMUSCULAR | Status: DC

## 2014-03-16 MED ORDER — METHYLPREDNISOLONE ACETATE 40 MG/ML IJ SUSP
80.0000 mg | Freq: Once | INTRAMUSCULAR | Status: AC
  Administered 2014-03-16: 80 mg via INTRA_ARTICULAR

## 2014-03-16 NOTE — Assessment & Plan Note (Signed)
Continue with current prescription therapy as reflected on the Med list.  

## 2014-03-16 NOTE — Assessment & Plan Note (Signed)
Lab Results  Component Value Date   TSH 10.67* 03/15/2014  Levothroid dose was increased to 112 mcg/d Labs in 2 mo

## 2014-03-16 NOTE — Progress Notes (Signed)
Subjective:    HPI  The patient is here for a wellness exam.   F/u recent URI  The patient presents for a follow-up of  chronic hypertension, depression, chronic dyslipidemia, type 2 pre-diabetes controlled with medicines, fatigue; B12 and vit D def. C/o stress B-in-law died. C/o severe L shoulder pain  [name] is taking Crestor... Not taking Norvasc 23-Oct-2022)  Her mother died on 04-09-11 She has been grieving - better now Her dtr is sick with the neck pain  Wt Readings from Last 3 Encounters:  03/16/14 148 lb (67.132 kg)  03/02/14 147 lb (66.679 kg)  08/20/13 143 lb (64.864 kg)   BP Readings from Last 3 Encounters:  03/16/14 120/86  03/02/14 168/98  08/20/13 120/76      Review of Systems  Constitutional: Positive for fatigue. Negative for chills, activity change, appetite change and unexpected weight change.  HENT: Negative for congestion, mouth sores and sinus pressure.   Eyes: Negative for visual disturbance.  Respiratory: Negative for cough and chest tightness.   Gastrointestinal: Negative for nausea and abdominal pain.  Genitourinary: Negative for frequency, difficulty urinating and vaginal pain.  Musculoskeletal: Negative for back pain and gait problem.  Skin: Negative for pallor and rash.  Neurological: Negative for dizziness, tremors, weakness, numbness and headaches.  Psychiatric/Behavioral: Negative for confusion and sleep disturbance.       Objective:   Physical Exam  Constitutional: She appears well-developed and well-nourished. No distress.  HENT:  Head: Normocephalic.  Right Ear: External ear normal.  Left Ear: External ear normal.  Nose: Nose normal.  Mouth/Throat: Oropharynx is clear and moist.  Eyes: Conjunctivae are normal. Pupils are equal, round, and reactive to light. Right eye exhibits no discharge. Left eye exhibits no discharge.  Neck: Normal range of motion. Neck supple. No JVD present. No tracheal deviation present. No thyromegaly  present.  Cardiovascular: Normal rate, regular rhythm and normal heart sounds.   Pulmonary/Chest: No stridor. No respiratory distress. She has no wheezes.  Abdominal: Soft. Bowel sounds are normal. She exhibits no distension and no mass. There is no tenderness. There is no rebound and no guarding.  Musculoskeletal: She exhibits no edema or tenderness.  Lymphadenopathy:    She has no cervical adenopathy.  Neurological: She displays normal reflexes. No cranial nerve deficit. She exhibits normal muscle tone. Coordination normal.  Skin: No rash noted. No erythema.  Psychiatric: She has a normal mood and affect. Her behavior is normal. Judgment and thought content normal.  eyth nasal lining L shoulder is painful w/ROM AKs on face and L forearm  Lab Results  Component Value Date   WBC 5.9 03/15/2014   HGB 15.1* 03/15/2014   HCT 43.9 03/15/2014   PLT 303.0 03/15/2014   GLUCOSE 122* 03/15/2014   CHOL 157 03/15/2014   TRIG 303.0* 03/15/2014   HDL 41.80 03/15/2014   LDLDIRECT 82.0 03/15/2014   LDLCALC 122* 12/04/2007   ALT 14 03/15/2014   AST 22 03/15/2014   NA 140 03/15/2014   K 4.4 03/15/2014   CL 104 03/15/2014   CREATININE 1.18 03/15/2014   BUN 17 03/15/2014   CO2 29 03/15/2014   TSH 10.67* 03/15/2014   HGBA1C 5.4 12/05/2010    Procedure Note :     Procedure : Cryosurgery   Indication: Actinic keratosis(es)   Risks including unsuccessful procedure , bleeding, infection, bruising, scar, a need for a repeat  procedure and others were explained to the patient in detail as well as the benefits.  Informed consent was obtained verbally.    2 lesion(s)  on L arm and 3 on R cheek   was/were treated with liquid nitrogen on a Q-tip in a usual fasion . Band-Aid was applied and antibiotic ointment was given for a later use.   Tolerated well. Complications none.       Procedure :Joint Injection,  L shoulder   Indication:  Subacromial bursitis with refractory  chronic pain.    Risks including unsuccessful procedure , bleeding, infection, bruising, skin atrophy, "steroid flare-up" and others were explained to the patient in detail as well as the benefits. Informed consent was obtained and signed.   Tthe patient was placed in a comfortable position. Lateral approach was used. Skin was prepped with Betadine and alcohol  and anesthetized with a cooling spray. Then, a 5 cc syringe with a 2 inch long 24-gauge needle was used for a joint injection.. The needle was advanced  Into the subacromial space.The bursa was injected with 3 mL of 2% lidocaine and 80 mg of Depo-Medrol .  Band-Aid was applied.   Tolerated well. Complications: None. Good pain relief following the procedure.           Assessment & Plan:  Patient ID: [name] [name], female   DOB: 1940/11/14, 74 y.o.   MRN: [id number]

## 2014-03-16 NOTE — Patient Instructions (Addendum)
Postprocedure instructions :    A Band-Aid should be left on for 12 hours. Injection therapy is not a cure itself. It is used in conjunction with other modalities. You can use nonsteroidal anti-inflammatories like ibuprofen , hot and cold compresses. Rest is recommended in the next 24 hours. You need to report immediately  if fever, chills or any signs of infection develop.     Postprocedure instructions :     Keep the wounds clean. You can wash them with liquid soap and water. Pat dry with gauze or a Kleenex tissue  Before applying antibiotic ointment and a Band-Aid.   You need to report immediately  if  any signs of infection develop. Preventive Care for Adults A healthy lifestyle and preventive care can promote health and wellness. Preventive health guidelines for women include the following key practices.  A routine yearly physical is a good way to check with your health care provider about your health and preventive screening. It is a chance to share any concerns and updates on your health and to receive a thorough exam.  Visit your dentist for a routine exam and preventive care every 6 months. Brush your teeth twice a day and floss once a day. Good oral hygiene prevents tooth decay and gum disease.  The frequency of eye exams is based on your age, health, family medical history, use of contact lenses, and other factors. Follow your health care provider's recommendations for frequency of eye exams.  Eat a healthy diet. Foods like vegetables, fruits, whole grains, low-fat dairy products, and lean protein foods contain the nutrients you need without too many calories. Decrease your intake of foods high in solid fats, added sugars, and salt. Eat the right amount of calories for you.Get information about a proper diet from your health care provider, if necessary.  Regular physical exercise is one of the most important things you can do for your health. Most adults should get at least 150  minutes of moderate-intensity exercise (any activity that increases your heart rate and causes you to sweat) each week. In addition, most adults need muscle-strengthening exercises on 2 or more days a week.  Maintain a healthy weight. The body mass index (BMI) is a screening tool to identify possible weight problems. It provides an estimate of body fat based on height and weight. Your health care provider can find your BMI and can help you achieve or maintain a healthy weight.For adults 20 years and older:  A BMI below 18.5 is considered underweight.  A BMI of 18.5 to 24.9 is normal.  A BMI of 25 to 29.9 is considered overweight.  A BMI of 30 and above is considered obese.  Maintain normal blood lipids and cholesterol levels by exercising and minimizing your intake of saturated fat. Eat a balanced diet with plenty of fruit and vegetables. Blood tests for lipids and cholesterol should begin at age 82 and be repeated every 5 years. If your lipid or cholesterol levels are high, you are over 50, or you are at high risk for heart disease, you may need your cholesterol levels checked more frequently.Ongoing high lipid and cholesterol levels should be treated with medicines if diet and exercise are not working.  If you smoke, find out from your health care provider how to quit. If you do not use tobacco, do not start.  Lung cancer screening is recommended for adults aged 54-80 years who are at high risk for developing lung cancer because of a history of  smoking. A yearly low-dose CT scan of the lungs is recommended for people who have at least a 30-pack-year history of smoking and are a current smoker or have quit within the past 15 years. A pack year of smoking is smoking an average of 1 pack of cigarettes a day for 1 year (for example: 1 pack a day for 30 years or 2 packs a day for 15 years). Yearly screening should continue until the smoker has stopped smoking for at least 15 years. Yearly screening  should be stopped for people who develop a health problem that would prevent them from having lung cancer treatment.  If you are pregnant, do not drink alcohol. If you are breastfeeding, be very cautious about drinking alcohol. If you are not pregnant and choose to drink alcohol, do not have more than 1 drink per day. One drink is considered to be 12 ounces (355 mL) of beer, 5 ounces (148 mL) of wine, or 1.5 ounces (44 mL) of liquor.  Avoid use of street drugs. Do not share needles with anyone. Ask for help if you need support or instructions about stopping the use of drugs.  High blood pressure causes heart disease and increases the risk of stroke. Your blood pressure should be checked at least every 1 to 2 years. Ongoing high blood pressure should be treated with medicines if weight loss and exercise do not work.  If you are 15-76 years old, ask your health care provider if you should take aspirin to prevent strokes.  Diabetes screening involves taking a blood sample to check your fasting blood sugar level. This should be done once every 3 years, after age 19, if you are within normal weight and without risk factors for diabetes. Testing should be considered at a younger age or be carried out more frequently if you are overweight and have at least 1 risk factor for diabetes.  Breast cancer screening is essential preventive care for women. You should practice "breast self-awareness." This means understanding the normal appearance and feel of your breasts and may include breast self-examination. Any changes detected, no matter how small, should be reported to a health care provider. Women in their 72s and 30s should have a clinical breast exam (CBE) by a health care provider as part of a regular health exam every 1 to 3 years. After age 1, women should have a CBE every year. Starting at age 79, women should consider having a mammogram (breast X-ray test) every year. Women who have a family history of  breast cancer should talk to their health care provider about genetic screening. Women at a high risk of breast cancer should talk to their health care providers about having an MRI and a mammogram every year.  Breast cancer gene (BRCA)-related cancer risk assessment is recommended for women who have family members with BRCA-related cancers. BRCA-related cancers include breast, ovarian, tubal, and peritoneal cancers. Having family members with these cancers may be associated with an increased risk for harmful changes (mutations) in the breast cancer genes BRCA1 and BRCA2. Results of the assessment will determine the need for genetic counseling and BRCA1 and BRCA2 testing.  Routine pelvic exams to screen for cancer are no longer recommended for nonpregnant women who are considered low risk for cancer of the pelvic organs (ovaries, uterus, and vagina) and who do not have symptoms. Ask your health care provider if a screening pelvic exam is right for you.  If you have had past treatment for cervical cancer  or a condition that could lead to cancer, you need Pap tests and screening for cancer for at least 20 years after your treatment. If Pap tests have been discontinued, your risk factors (such as having a new sexual partner) need to be reassessed to determine if screening should be resumed. Some women have medical problems that increase the chance of getting cervical cancer. In these cases, your health care provider may recommend more frequent screening and Pap tests.  The HPV test is an additional test that may be used for cervical cancer screening. The HPV test looks for the virus that can cause the cell changes on the cervix. The cells collected during the Pap test can be tested for HPV. The HPV test could be used to screen women aged 58 years and older, and should be used in women of any age who have unclear Pap test results. After the age of 92, women should have HPV testing at the same frequency as a Pap  test.  Colorectal cancer can be detected and often prevented. Most routine colorectal cancer screening begins at the age of 41 years and continues through age 27 years. However, your health care provider may recommend screening at an earlier age if you have risk factors for colon cancer. On a yearly basis, your health care provider may provide home test kits to check for hidden blood in the stool. Use of a small camera at the end of a tube, to directly examine the colon (sigmoidoscopy or colonoscopy), can detect the earliest forms of colorectal cancer. Talk to your health care provider about this at age 76, when routine screening begins. Direct exam of the colon should be repeated every 5-10 years through age 89 years, unless early forms of pre-cancerous polyps or small growths are found.  People who are at an increased risk for hepatitis B should be screened for this virus. You are considered at high risk for hepatitis B if:  You were born in a country where hepatitis B occurs often. Talk with your health care provider about which countries are considered high risk.  Your parents were born in a high-risk country and you have not received a shot to protect against hepatitis B (hepatitis B vaccine).  You have HIV or AIDS.  You use needles to inject street drugs.  You live with, or have sex with, someone who has hepatitis B.  You get hemodialysis treatment.  You take certain medicines for conditions like cancer, organ transplantation, and autoimmune conditions.  Hepatitis C blood testing is recommended for all people born from [birth year] through 1965 and any individual with known risks for hepatitis C.  Practice safe sex. Use condoms and avoid high-risk sexual practices to reduce the spread of sexually transmitted infections (STIs). STIs include gonorrhea, chlamydia, syphilis, trichomonas, herpes, HPV, and human immunodeficiency virus (HIV). Herpes, HIV, and HPV are viral illnesses that have no cure.  They can result in disability, cancer, and death.  You should be screened for sexually transmitted illnesses (STIs) including gonorrhea and chlamydia if:  You are sexually active and are younger than 24 years.  You are older than 24 years and your health care provider tells you that you are at risk for this type of infection.  Your sexual activity has changed since you were last screened and you are at an increased risk for chlamydia or gonorrhea. Ask your health care provider if you are at risk.  If you are at risk of being infected with HIV, it  is recommended that you take a prescription medicine daily to prevent HIV infection. This is called preexposure prophylaxis (PrEP). You are considered at risk if:  You are a heterosexual woman, are sexually active, and are at increased risk for HIV infection.  You take drugs by injection.  You are sexually active with a partner who has HIV.  Talk with your health care provider about whether you are at high risk of being infected with HIV. If you choose to begin PrEP, you should first be tested for HIV. You should then be tested every 3 months for as long as you are taking PrEP.  Osteoporosis is a disease in which the bones lose minerals and strength with aging. This can result in serious bone fractures or breaks. The risk of osteoporosis can be identified using a bone density scan. Women ages 71 years and over and women at risk for fractures or osteoporosis should discuss screening with their health care providers. Ask your health care provider whether you should take a calcium supplement or vitamin D to reduce the rate of osteoporosis.  Menopause can be associated with physical symptoms and risks. Hormone replacement therapy is available to decrease symptoms and risks. You should talk to your health care provider about whether hormone replacement therapy is right for you.  Use sunscreen. Apply sunscreen liberally and repeatedly throughout the day.  You should seek shade when your shadow is shorter than you. Protect yourself by wearing long sleeves, pants, a wide-brimmed hat, and sunglasses year round, whenever you are outdoors.  Once a month, do a whole body skin exam, using a mirror to look at the skin on your back. Tell your health care provider of new moles, moles that have irregular borders, moles that are larger than a pencil eraser, or moles that have changed in shape or color.  Stay current with required vaccines (immunizations).  Influenza vaccine. All adults should be immunized every year.  Tetanus, diphtheria, and acellular pertussis (Td, Tdap) vaccine. Pregnant women should receive 1 dose of Tdap vaccine during each pregnancy. The dose should be obtained regardless of the length of time since the last dose. Immunization is preferred during the 27th-36th week of gestation. An adult who has not previously received Tdap or who does not know her vaccine status should receive 1 dose of Tdap. This initial dose should be followed by tetanus and diphtheria toxoids (Td) booster doses every 10 years. Adults with an unknown or incomplete history of completing a 3-dose immunization series with Td-containing vaccines should begin or complete a primary immunization series including a Tdap dose. Adults should receive a Td booster every 10 years.  Varicella vaccine. An adult without evidence of immunity to varicella should receive 2 doses or a second dose if she has previously received 1 dose. Pregnant females who do not have evidence of immunity should receive the first dose after pregnancy. This first dose should be obtained before leaving the health care facility. The second dose should be obtained 4-8 weeks after the first dose.  Human papillomavirus (HPV) vaccine. Females aged 13-26 years who have not received the vaccine previously should obtain the 3-dose series. The vaccine is not recommended for use in pregnant females. However, pregnancy  testing is not needed before receiving a dose. If a female is found to be pregnant after receiving a dose, no treatment is needed. In that case, the remaining doses should be delayed until after the pregnancy. Immunization is recommended for any person with an immunocompromised condition  through the age of 7 years if she did not get any or all doses earlier. During the 3-dose series, the second dose should be obtained 4-8 weeks after the first dose. The third dose should be obtained 24 weeks after the first dose and 16 weeks after the second dose.  Zoster vaccine. One dose is recommended for adults aged 4 years or older unless certain conditions are present.  Measles, mumps, and rubella (MMR) vaccine. Adults born before [birth year] generally are considered immune to measles and mumps. Adults born in [birth year] or later should have 1 or more doses of MMR vaccine unless there is a contraindication to the vaccine or there is laboratory evidence of immunity to each of the three diseases. A routine second dose of MMR vaccine should be obtained at least 28 days after the first dose for students attending postsecondary schools, health care workers, or international travelers. People who received inactivated measles vaccine or an unknown type of measles vaccine during 1963-1967 should receive 2 doses of MMR vaccine. People who received inactivated mumps vaccine or an unknown type of mumps vaccine before 1979 and are at high risk for mumps infection should consider immunization with 2 doses of MMR vaccine. For females of childbearing age, rubella immunity should be determined. If there is no evidence of immunity, females who are not pregnant should be vaccinated. If there is no evidence of immunity, females who are pregnant should delay immunization until after pregnancy. Unvaccinated health care workers born before [birth year] who lack laboratory evidence of measles, mumps, or rubella immunity or laboratory confirmation of disease should  consider measles and mumps immunization with 2 doses of MMR vaccine or rubella immunization with 1 dose of MMR vaccine.  Pneumococcal 13-valent conjugate (PCV13) vaccine. When indicated, a person who is uncertain of her immunization history and has no record of immunization should receive the PCV13 vaccine. An adult aged 88 years or older who has certain medical conditions and has not been previously immunized should receive 1 dose of PCV13 vaccine. This PCV13 should be followed with a dose of pneumococcal polysaccharide (PPSV23) vaccine. The PPSV23 vaccine dose should be obtained at least 8 weeks after the dose of PCV13 vaccine. An adult aged 33 years or older who has certain medical conditions and previously received 1 or more doses of PPSV23 vaccine should receive 1 dose of PCV13. The PCV13 vaccine dose should be obtained 1 or more years after the last PPSV23 vaccine dose.  Pneumococcal polysaccharide (PPSV23) vaccine. When PCV13 is also indicated, PCV13 should be obtained first. All adults aged 18 years and older should be immunized. An adult younger than age 91 years who has certain medical conditions should be immunized. Any person who resides in a nursing home or long-term care facility should be immunized. An adult smoker should be immunized. People with an immunocompromised condition and certain other conditions should receive both PCV13 and PPSV23 vaccines. People with human immunodeficiency virus (HIV) infection should be immunized as soon as possible after diagnosis. Immunization during chemotherapy or radiation therapy should be avoided. Routine use of PPSV23 vaccine is not recommended for American Indians, [place] Natives, or people younger than 65 years unless there are medical conditions that require PPSV23 vaccine. When indicated, people who have unknown immunization and have no record of immunization should receive PPSV23 vaccine. One-time revaccination 5 years after the first dose of PPSV23 is  recommended for people aged 19-64 years who have chronic kidney failure, nephrotic syndrome, asplenia, or immunocompromised conditions. People who  received 1-2 doses of PPSV23 before age 63 years should receive another dose of PPSV23 vaccine at age 60 years or later if at least 5 years have passed since the previous dose. Doses of PPSV23 are not needed for people immunized with PPSV23 at or after age 61 years.  Meningococcal vaccine. Adults with asplenia or persistent complement component deficiencies should receive 2 doses of quadrivalent meningococcal conjugate (MenACWY-D) vaccine. The doses should be obtained at least 2 months apart. Microbiologists working with certain meningococcal bacteria, [place] recruits, people at risk during an outbreak, and people who travel to or live in countries with a high rate of meningitis should be immunized. A first-year college student up through age 80 years who is living in a residence hall should receive a dose if she did not receive a dose on or after her 16th birthday. Adults who have certain high-risk conditions should receive one or more doses of vaccine.  Hepatitis A vaccine. Adults who wish to be protected from this disease, have certain high-risk conditions, work with hepatitis A-infected animals, work in hepatitis A research labs, or travel to or work in countries with a high rate of hepatitis A should be immunized. Adults who were previously unvaccinated and who anticipate close contact with an international adoptee during the first 60 days after arrival in the [place] from a country with a high rate of hepatitis A should be immunized.  Hepatitis B vaccine. Adults who wish to be protected from this disease, have certain high-risk conditions, may be exposed to blood or other infectious body fluids, are household contacts or sex partners of hepatitis B positive people, are clients or workers in certain care facilities, or travel to or work in countries  with a high rate of hepatitis B should be immunized.  Haemophilus influenzae type b (Hib) vaccine. A previously unvaccinated person with asplenia or sickle cell disease or having a scheduled splenectomy should receive 1 dose of Hib vaccine. Regardless of previous immunization, a recipient of a hematopoietic stem cell transplant should receive a 3-dose series 6-12 months after her successful transplant. Hib vaccine is not recommended for adults with HIV infection. Preventive Services / Frequency Ages 28 to 55 years  Blood pressure check.** / Every 1 to 2 years.  Lipid and cholesterol check.** / Every 5 years beginning at age 29.  Clinical breast exam.** / Every 3 years for women in their 9s and 65s.  BRCA-related cancer risk assessment.** / For women who have family members with a BRCA-related cancer (breast, ovarian, tubal, or peritoneal cancers).  Pap test.** / Every 2 years from ages 33 through 11. Every 3 years starting at age 56 through age 46 or 47 with a history of 3 consecutive normal Pap tests.  HPV screening.** / Every 3 years from ages 60 through ages 56 to 59 with a history of 3 consecutive normal Pap tests.  Hepatitis C blood test.** / For any individual with known risks for hepatitis C.  Skin self-exam. / Monthly.  Influenza vaccine. / Every year.  Tetanus, diphtheria, and acellular pertussis (Tdap, Td) vaccine.** / Consult your health care provider. Pregnant women should receive 1 dose of Tdap vaccine during each pregnancy. 1 dose of Td every 10 years.  Varicella vaccine.** / Consult your health care provider. Pregnant females who do not have evidence of immunity should receive the first dose after pregnancy.  HPV vaccine. / 3 doses over 6 months, if 29 and younger. The vaccine is not recommended for use  in pregnant females. However, pregnancy testing is not needed before receiving a dose.  Measles, mumps, rubella (MMR) vaccine.** / You need at least 1 dose of MMR if you  were born in 1957 or later. You may also need a 2nd dose. For females of childbearing age, rubella immunity should be determined. If there is no evidence of immunity, females who are not pregnant should be vaccinated. If there is no evidence of immunity, females who are pregnant should delay immunization until after pregnancy.  Pneumococcal 13-valent conjugate (PCV13) vaccine.** / Consult your health care provider.  Pneumococcal polysaccharide (PPSV23) vaccine.** / 1 to 2 doses if you smoke cigarettes or if you have certain conditions.  Meningococcal vaccine.** / 1 dose if you are age 52 to 80 years and a Market researcher living in a residence hall, or have one of several medical conditions, you need to get vaccinated against meningococcal disease. You may also need additional booster doses.  Hepatitis A vaccine.** / Consult your health care provider.  Hepatitis B vaccine.** / Consult your health care provider.  Haemophilus influenzae type b (Hib) vaccine.** / Consult your health care provider. Ages 42 to 50 years  Blood pressure check.** / Every 1 to 2 years.  Lipid and cholesterol check.** / Every 5 years beginning at age 77 years.  Lung cancer screening. / Every year if you are aged 64-80 years and have a 30-pack-year history of smoking and currently smoke or have quit within the past 15 years. Yearly screening is stopped once you have quit smoking for at least 15 years or develop a health problem that would prevent you from having lung cancer treatment.  Clinical breast exam.** / Every year after age 7 years.  BRCA-related cancer risk assessment.** / For women who have family members with a BRCA-related cancer (breast, ovarian, tubal, or peritoneal cancers).  Mammogram.** / Every year beginning at age 77 years and continuing for as long as you are in good health. Consult with your health care provider.  Pap test.** / Every 3 years starting at age 17 years through age 37 or  71 years with a history of 3 consecutive normal Pap tests.  HPV screening.** / Every 3 years from ages 85 years through ages 56 to 66 years with a history of 3 consecutive normal Pap tests.  Fecal occult blood test (FOBT) of stool. / Every year beginning at age 70 years and continuing until age 70 years. You may not need to do this test if you get a colonoscopy every 10 years.  Flexible sigmoidoscopy or colonoscopy.** / Every 5 years for a flexible sigmoidoscopy or every 10 years for a colonoscopy beginning at age 24 years and continuing until age 2 years.  Hepatitis C blood test.** / For all people born from [birth year] through 1966 and any individual with known risks for hepatitis C.  Skin self-exam. / Monthly.  Influenza vaccine. / Every year.  Tetanus, diphtheria, and acellular pertussis (Tdap/Td) vaccine.** / Consult your health care provider. Pregnant women should receive 1 dose of Tdap vaccine during each pregnancy. 1 dose of Td every 10 years.  Varicella vaccine.** / Consult your health care provider. Pregnant females who do not have evidence of immunity should receive the first dose after pregnancy.  Zoster vaccine.** / 1 dose for adults aged 39 years or older.  Measles, mumps, rubella (MMR) vaccine.** / You need at least 1 dose of MMR if you were born in 1957 or later. You may also need  a 2nd dose. For females of childbearing age, rubella immunity should be determined. If there is no evidence of immunity, females who are not pregnant should be vaccinated. If there is no evidence of immunity, females who are pregnant should delay immunization until after pregnancy.  Pneumococcal 13-valent conjugate (PCV13) vaccine.** / Consult your health care provider.  Pneumococcal polysaccharide (PPSV23) vaccine.** / 1 to 2 doses if you smoke cigarettes or if you have certain conditions.  Meningococcal vaccine.** / Consult your health care provider.  Hepatitis A vaccine.** / Consult your health care  provider.  Hepatitis B vaccine.** / Consult your health care provider.  Haemophilus influenzae type b (Hib) vaccine.** / Consult your health care provider. Ages 42 years and over  Blood pressure check.** / Every 1 to 2 years.  Lipid and cholesterol check.** / Every 5 years beginning at age 46 years.  Lung cancer screening. / Every year if you are aged 87-80 years and have a 30-pack-year history of smoking and currently smoke or have quit within the past 15 years. Yearly screening is stopped once you have quit smoking for at least 15 years or develop a health problem that would prevent you from having lung cancer treatment.  Clinical breast exam.** / Every year after age 71 years.  BRCA-related cancer risk assessment.** / For women who have family members with a BRCA-related cancer (breast, ovarian, tubal, or peritoneal cancers).  Mammogram.** / Every year beginning at age 56 years and continuing for as long as you are in good health. Consult with your health care provider.  Pap test.** / Every 3 years starting at age 31 years through age 66 or 55 years with 3 consecutive normal Pap tests. Testing can be stopped between 65 and 70 years with 3 consecutive normal Pap tests and no abnormal Pap or HPV tests in the past 10 years.  HPV screening.** / Every 3 years from ages 73 years through ages 40 or 77 years with a history of 3 consecutive normal Pap tests. Testing can be stopped between 65 and 70 years with 3 consecutive normal Pap tests and no abnormal Pap or HPV tests in the past 10 years.  Fecal occult blood test (FOBT) of stool. / Every year beginning at age 26 years and continuing until age 7 years. You may not need to do this test if you get a colonoscopy every 10 years.  Flexible sigmoidoscopy or colonoscopy.** / Every 5 years for a flexible sigmoidoscopy or every 10 years for a colonoscopy beginning at age 61 years and continuing until age 42 years.  Hepatitis C blood test.** / For  all people born from [birth year] through 1966 and any individual with known risks for hepatitis C.  Osteoporosis screening.** / A one-time screening for women ages 68 years and over and women at risk for fractures or osteoporosis.  Skin self-exam. / Monthly.  Influenza vaccine. / Every year.  Tetanus, diphtheria, and acellular pertussis (Tdap/Td) vaccine.** / 1 dose of Td every 10 years.  Varicella vaccine.** / Consult your health care provider.  Zoster vaccine.** / 1 dose for adults aged 26 years or older.  Pneumococcal 13-valent conjugate (PCV13) vaccine.** / Consult your health care provider.  Pneumococcal polysaccharide (PPSV23) vaccine.** / 1 dose for all adults aged 36 years and older.  Meningococcal vaccine.** / Consult your health care provider.  Hepatitis A vaccine.** / Consult your health care provider.  Hepatitis B vaccine.** / Consult your health care provider.  Haemophilus influenzae type b (Hib) vaccine.** /  Consult your health care provider. ** Family history and personal history of risk and conditions may change your health care provider's recommendations. Document Released: 03/26/2001 Document Revised: 06/14/2013 Document Reviewed: 06/25/2010 [facility] Patient Information 2015 [place], [place]. This information is not intended to replace advice given to you by your health care provider. Make sure you discuss any questions you have with your health care provider.

## 2014-03-16 NOTE — Assessment & Plan Note (Signed)
Notes recently worsening blurry vision, L>R. He is overdue for eye exam - requests return to [facility] - referral placed.  

## 2014-03-16 NOTE — Assessment & Plan Note (Signed)
Labs [employer]

## 2014-03-16 NOTE — Progress Notes (Signed)
Pre visit review using our clinic review tool, if applicable. No additional management support is needed unless otherwise documented below in the visit note. 

## 2014-03-16 NOTE — Assessment & Plan Note (Signed)
Re-start Vit d Risks associated with treatment noncompliance were discussed. Compliance was encouraged.

## 2014-03-17 ENCOUNTER — Telehealth: Payer: Self-pay | Admitting: Internal Medicine

## 2014-03-17 NOTE — Telephone Encounter (Signed)
emmi emailed °

## 2014-04-04 ENCOUNTER — Other Ambulatory Visit: Payer: Self-pay

## 2014-04-04 DIAGNOSIS — Z1231 Encounter for screening mammogram for malignant neoplasm of breast: Secondary | ICD-10-CM

## 2014-05-17 ENCOUNTER — Other Ambulatory Visit: Payer: Self-pay | Admitting: *Deleted

## 2014-05-17 ENCOUNTER — Ambulatory Visit: Payer: BLUE CROSS/BLUE SHIELD

## 2014-05-17 MED ORDER — LEVOTHYROXINE SODIUM 112 MCG PO TABS
112.0000 ug | ORAL_TABLET | Freq: Every day | ORAL | Status: DC
Start: 2014-05-17 — End: 2015-07-20

## 2014-05-18 ENCOUNTER — Ambulatory Visit: Payer: BLUE CROSS/BLUE SHIELD | Admitting: Internal Medicine

## 2014-05-19 ENCOUNTER — Other Ambulatory Visit: Payer: Self-pay | Admitting: Internal Medicine

## 2014-05-27 ENCOUNTER — Ambulatory Visit: Payer: BLUE CROSS/BLUE SHIELD

## 2014-05-31 ENCOUNTER — Ambulatory Visit: Payer: BLUE CROSS/BLUE SHIELD | Admitting: Internal Medicine

## 2014-06-06 ENCOUNTER — Telehealth: Payer: Self-pay | Admitting: Internal Medicine

## 2014-06-06 DIAGNOSIS — E890 Postprocedural hypothyroidism: Secondary | ICD-10-CM

## 2014-06-06 DIAGNOSIS — R5382 Chronic fatigue, unspecified: Secondary | ICD-10-CM

## 2014-06-06 DIAGNOSIS — I10 Essential (primary) hypertension: Secondary | ICD-10-CM

## 2014-06-06 DIAGNOSIS — E538 Deficiency of other specified B group vitamins: Secondary | ICD-10-CM

## 2014-06-06 NOTE — Telephone Encounter (Signed)
OK TSH, FT4, UA, Vit B12, LFT, CBC Keep OV Thx

## 2014-06-06 NOTE — Telephone Encounter (Signed)
Labs entered. Pt informed

## 2014-06-06 NOTE — Telephone Encounter (Signed)
Patient is feeling really bad and wondering if she would be able to come in today for the lab work for her appt tomorrow.  Please advise patient.

## 2014-06-07 ENCOUNTER — Other Ambulatory Visit (INDEPENDENT_AMBULATORY_CARE_PROVIDER_SITE_OTHER): Payer: BLUE CROSS/BLUE SHIELD

## 2014-06-07 ENCOUNTER — Encounter: Payer: Self-pay | Admitting: Internal Medicine

## 2014-06-07 ENCOUNTER — Ambulatory Visit (INDEPENDENT_AMBULATORY_CARE_PROVIDER_SITE_OTHER): Payer: BLUE CROSS/BLUE SHIELD | Admitting: Internal Medicine

## 2014-06-07 VITALS — BP 112/82 | HR 120 | Temp 98.4°F | Ht 64.5 in | Wt 143.8 lb

## 2014-06-07 DIAGNOSIS — R000 Tachycardia, unspecified: Secondary | ICD-10-CM | POA: Diagnosis not present

## 2014-06-07 DIAGNOSIS — E890 Postprocedural hypothyroidism: Secondary | ICD-10-CM

## 2014-06-07 DIAGNOSIS — R5382 Chronic fatigue, unspecified: Secondary | ICD-10-CM | POA: Diagnosis not present

## 2014-06-07 DIAGNOSIS — I10 Essential (primary) hypertension: Secondary | ICD-10-CM

## 2014-06-07 DIAGNOSIS — R5383 Other fatigue: Secondary | ICD-10-CM | POA: Insufficient documentation

## 2014-06-07 DIAGNOSIS — E538 Deficiency of other specified B group vitamins: Secondary | ICD-10-CM | POA: Diagnosis not present

## 2014-06-07 LAB — CBC WITH DIFFERENTIAL/PLATELET
Basophils Absolute: 0 10*3/uL (ref 0.0–0.1)
Basophils Relative: 0.3 % (ref 0.0–3.0)
EOS PCT: 1.5 % (ref 0.0–5.0)
Eosinophils Absolute: 0.1 10*3/uL (ref 0.0–0.7)
HCT: 42.2 % (ref 36.0–46.0)
Hemoglobin: 14.4 g/dL (ref 12.0–15.0)
Lymphocytes Relative: 36 % (ref 12.0–46.0)
Lymphs Abs: 2.2 10*3/uL (ref 0.7–4.0)
MCHC: 34.2 g/dL (ref 30.0–36.0)
MCV: 90.7 fl (ref 78.0–100.0)
Monocytes Absolute: 0.6 10*3/uL (ref 0.1–1.0)
Monocytes Relative: 9 % (ref 3.0–12.0)
Neutro Abs: 3.3 10*3/uL (ref 1.4–7.7)
Neutrophils Relative %: 53.2 % (ref 43.0–77.0)
Platelets: 258 10*3/uL (ref 150.0–400.0)
RBC: 4.66 Mil/uL (ref 3.87–5.11)
RDW: 13.5 % (ref 11.5–15.5)
WBC: 6.2 10*3/uL (ref 4.0–10.5)

## 2014-06-07 LAB — HEPATIC FUNCTION PANEL
ALBUMIN: 4.4 g/dL (ref 3.5–5.2)
ALT: 17 U/L (ref 0–35)
AST: 21 U/L (ref 0–37)
Alkaline Phosphatase: 94 U/L (ref 39–117)
BILIRUBIN TOTAL: 0.8 mg/dL (ref 0.2–1.2)
Bilirubin, Direct: 0.1 mg/dL (ref 0.0–0.3)
TOTAL PROTEIN: 7.4 g/dL (ref 6.0–8.3)

## 2014-06-07 LAB — TSH: TSH: 4.59 u[IU]/mL — ABNORMAL HIGH (ref 0.35–4.50)

## 2014-06-07 LAB — VITAMIN B12: Vitamin B-12: 673 pg/mL (ref 211–911)

## 2014-06-07 LAB — T4, FREE: Free T4: 0.84 ng/dL (ref 0.60–1.60)

## 2014-06-07 NOTE — Progress Notes (Signed)
Pre visit review using our clinic review tool, if applicable. No additional management support is needed unless otherwise documented below in the visit note. 

## 2014-06-07 NOTE — Assessment & Plan Note (Signed)
Labs EKG

## 2014-06-07 NOTE — Assessment & Plan Note (Signed)
Labs EKG Card consult recommended

## 2014-06-07 NOTE — Progress Notes (Signed)
   Subjective:    HPI   C/o fatigue x 1 mo, no energy   The patient presents for a follow-up of  chronic hypertension, depression, chronic dyslipidemia, type 2 pre-diabetes controlled with medicines, fatigue; B12 and vit D def. C/o stress B-in-law died.   [name] [name] is taking Crestor... Not taking Norvasc 2022/10/14)  Her mother died on 03/31/2011 She has been grieving - better now Her dtr is sick with the neck pain  Wt Readings from Last 3 Encounters:  06/07/14 143 lb 12 oz (65.205 kg)  03/16/14 148 lb (67.132 kg)  03/02/14 147 lb (66.679 kg)   BP Readings from Last 3 Encounters:  06/07/14 112/82  03/16/14 120/86  03/02/14 168/98      Review of Systems  Constitutional: Positive for fatigue. Negative for chills, activity change, appetite change and unexpected weight change.  HENT: Negative for congestion, mouth sores and sinus pressure.   Eyes: Negative for visual disturbance.  Respiratory: Negative for cough and chest tightness.   Gastrointestinal: Negative for nausea and abdominal pain.  Genitourinary: Negative for frequency, difficulty urinating and vaginal pain.  Musculoskeletal: Negative for back pain and gait problem.  Skin: Negative for pallor and rash.  Neurological: Negative for dizziness, tremors, weakness, numbness and headaches.  Psychiatric/Behavioral: Negative for confusion and sleep disturbance.   BP 112/82 mmHg  Pulse 120  Temp(Src) 98.4 F (36.9 C) (Oral)  Ht 5' 4.5" (1.638 m)  Wt 143 lb 12 oz (65.205 kg)  BMI 24.30 kg/m2  SpO2 97% HR 90s    Objective:   Physical Exam  Constitutional: She appears well-developed and well-nourished. No distress.  HENT:  Head: Normocephalic.  Right Ear: External ear normal.  Left Ear: External ear normal.  Nose: Nose normal.  Mouth/Throat: Oropharynx is clear and moist.  Eyes: Conjunctivae are normal. Pupils are equal, round, and reactive to light. Right eye exhibits no discharge. Left eye exhibits no discharge.   Neck: Normal range of motion. Neck supple. No JVD present. No tracheal deviation present. No thyromegaly present.  Cardiovascular: Normal rate, regular rhythm and normal heart sounds.   Pulmonary/Chest: No stridor. No respiratory distress. She has no wheezes.  Abdominal: Soft. Bowel sounds are normal. She exhibits no distension and no mass. There is no tenderness. There is no rebound and no guarding.  Musculoskeletal: She exhibits no edema or tenderness.  Lymphadenopathy:    She has no cervical adenopathy.  Neurological: She displays normal reflexes. No cranial nerve deficit. She exhibits normal muscle tone. Coordination normal.  Skin: No rash noted. No erythema.  Psychiatric: She has a normal mood and affect. Her behavior is normal. Judgment and thought content normal.    Lab Results  Component Value Date   WBC 5.9 03/15/2014   HGB 15.1* 03/15/2014   HCT 43.9 03/15/2014   PLT 303.0 03/15/2014   GLUCOSE 122* 03/15/2014   CHOL 157 03/15/2014   TRIG 303.0* 03/15/2014   HDL 41.80 03/15/2014   LDLDIRECT 82.0 03/15/2014   LDLCALC 122* 12/04/2007   ALT 14 03/15/2014   AST 22 03/15/2014   NA 140 03/15/2014   K 4.4 03/15/2014   CL 104 03/15/2014   CREATININE 1.18 03/15/2014   BUN 17 03/15/2014   CO2 29 03/15/2014   TSH 10.67* 03/15/2014   HGBA1C 5.4 12/05/2010       EKG           Assessment & Plan:

## 2014-06-09 ENCOUNTER — Telehealth: Payer: Self-pay | Admitting: Internal Medicine

## 2014-06-09 NOTE — Telephone Encounter (Signed)
Advised pt of lab results and also mailed copy of labs per patient request

## 2014-06-09 NOTE — Telephone Encounter (Signed)
Please call patient with lab results

## 2014-06-12 ENCOUNTER — Other Ambulatory Visit: Payer: Self-pay | Admitting: Internal Medicine

## 2014-06-14 ENCOUNTER — Ambulatory Visit
Admission: RE | Admit: 2014-06-14 | Discharge: 2014-06-14 | Disposition: A | Discharge status: Home / Self Care | Payer: Medicare Other | Source: Ambulatory Visit

## 2014-06-14 ENCOUNTER — Other Ambulatory Visit: Payer: Self-pay

## 2014-06-14 DIAGNOSIS — Z1231 Encounter for screening mammogram for malignant neoplasm of breast: Secondary | ICD-10-CM

## 2014-06-22 NOTE — Assessment & Plan Note (Signed)
Re-start B12 

## 2014-07-15 ENCOUNTER — Telehealth: Payer: Self-pay | Admitting: Internal Medicine

## 2014-07-15 MED ORDER — MOMETASONE FUROATE 0.1 % EX OINT: TOPICAL_OINTMENT | Freq: Two times a day (BID) | CUTANEOUS | Status: DC | PRN

## 2014-07-15 NOTE — Telephone Encounter (Signed)
Notified pt oint has been sent...lmb

## 2014-07-15 NOTE — Telephone Encounter (Signed)
Patient is requesting a script for psoriasis ointment that she had been given a few years ago. i was unable to find the ointment in the past meds. Verified pharmacy

## 2014-08-04 ENCOUNTER — Telehealth: Payer: Self-pay | Admitting: Internal Medicine

## 2014-08-08 ENCOUNTER — Other Ambulatory Visit: Payer: Self-pay

## 2014-10-11 ENCOUNTER — Encounter: Payer: Self-pay | Admitting: Internal Medicine

## 2014-10-11 ENCOUNTER — Ambulatory Visit (INDEPENDENT_AMBULATORY_CARE_PROVIDER_SITE_OTHER): Payer: BLUE CROSS/BLUE SHIELD | Admitting: Internal Medicine

## 2014-10-11 VITALS — BP 124/84 | HR 84 | Temp 98.3°F | Resp 16 | Wt 141.0 lb

## 2014-10-11 DIAGNOSIS — R1031 Right lower quadrant pain: Secondary | ICD-10-CM

## 2014-10-11 MED ORDER — TRAMADOL HCL 50 MG PO TABS: 50.0000 mg | ORAL_TABLET | Freq: Three times a day (TID) | ORAL | Status: DC | PRN

## 2014-10-11 NOTE — Patient Instructions (Signed)
Stay on clear liquids for 48-72 hours or until symptoms resolve.This would include  jello, sherbert (NOT ice cream), Lipton's chicken noodle soup(NOT cream based soups),Gatorade Lite, flat Ginger ale (without High Fructose Corn Syrup),dry toast or crackers, baked potato.No milk , dairy or grease until bowels are formed.  Report increasing pain, fever or rectal bleeding

## 2014-10-11 NOTE — Progress Notes (Signed)
   Subjective:    Patient ID: [name] [name], female    DOB: 19-Apr-1940, 74 y.o.   MRN: [id number]  HPI She began to have some discomfort in the right lower quadrant 10/09/14 which she described as a pulling sensation. This eased off. It recurred 10/10/14 and was up to level VII on a 10 scale. She has some associated discomfort in the right groin and the right lumbosacral area. The pain is worse standing. It did improve today following a normal bowel movement.  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 She states she had one in 2014 which was also negative. She has a history of "chronic appendicitis" in high school.   Review of Systems  Unexplained weight loss, significant dyspepsia, dysphagia, melena, rectal bleeding, or persistently small caliber stools are denied. She denies fever, chills, sweats. There has been no diarrhea or constipation. Dysuria, pyuria, hematuria, frequency, nocturia or polyuria are denied.     Objective:   Physical Exam Pertinent or positive findings include: She appears much younger than stated age. The bowel sounds are slightly decreased; there is no ileus. There is slight tenderness in the right lower quadrant. Rectal exam reveals minimal mucoid stool which is Hemoccult-negative. There is no significant adnexal tenderness on the right or the left. There is some discomfort on the right to palpation.   General appearance :adequately nourished; in no distress.  Eyes: No conjunctival inflammation or scleral icterus is present.  Oral exam:  Lips and gums are healthy appearing.There is no oropharyngeal erythema or exudate noted. Dental hygiene is good.  Heart:  Normal rate and regular rhythm. S1 and S2 normal without gallop, murmur, click, rub or other extra sounds    Lungs:Chest clear to auscultation; no wheezes, rhonchi,rales ,or rubs present.No increased work of breathing.   Abdomen:  bowel sounds normal, soft and non-tender without masses, organomegaly or hernias noted.  No guarding or rebound. No flank tenderness to percussion.  Vascular : all pulses equal ; no bruits present.  Skin:Warm & dry.  Intact without suspicious lesions or rashes ; no tenting or jaundice   Lymphatic: No lymphadenopathy is noted about the head, neck, axilla.   Neuro: Strength, tone & DTRs normal. Neg SLR past 70 degrees bilaterally. Heel & toe walking  WNL         Assessment & Plan:  #1 RLQ abdominal pain  #2 past medical history of "chronic appendicitis". No appendicitis clinically.  #3 repetitive lifting ; R/O mild R lumbar radiculopathy

## 2014-10-11 NOTE — Progress Notes (Signed)
Pre visit review using our clinic review tool, if applicable. No additional management support is needed unless otherwise documented below in the visit note. 

## 2014-11-30 ENCOUNTER — Other Ambulatory Visit: Payer: Self-pay | Admitting: Internal Medicine

## 2015-05-01 ENCOUNTER — Other Ambulatory Visit: Payer: Self-pay

## 2015-05-01 DIAGNOSIS — Z1231 Encounter for screening mammogram for malignant neoplasm of breast: Secondary | ICD-10-CM

## 2015-06-05 ENCOUNTER — Other Ambulatory Visit (INDEPENDENT_AMBULATORY_CARE_PROVIDER_SITE_OTHER): Payer: BLUE CROSS/BLUE SHIELD

## 2015-06-05 ENCOUNTER — Encounter: Payer: Self-pay | Admitting: Internal Medicine

## 2015-06-05 ENCOUNTER — Ambulatory Visit (INDEPENDENT_AMBULATORY_CARE_PROVIDER_SITE_OTHER): Payer: BLUE CROSS/BLUE SHIELD | Admitting: Internal Medicine

## 2015-06-05 VITALS — BP 120/80 | HR 95 | Temp 97.4°F | Ht 64.5 in | Wt 141.5 lb

## 2015-06-05 DIAGNOSIS — Z23 Encounter for immunization: Secondary | ICD-10-CM

## 2015-06-05 DIAGNOSIS — E538 Deficiency of other specified B group vitamins: Secondary | ICD-10-CM

## 2015-06-05 DIAGNOSIS — Z0000 Encounter for general adult medical examination without abnormal findings: Secondary | ICD-10-CM

## 2015-06-05 DIAGNOSIS — E785 Hyperlipidemia, unspecified: Secondary | ICD-10-CM | POA: Diagnosis not present

## 2015-06-05 DIAGNOSIS — E890 Postprocedural hypothyroidism: Secondary | ICD-10-CM

## 2015-06-05 DIAGNOSIS — E559 Vitamin D deficiency, unspecified: Secondary | ICD-10-CM

## 2015-06-05 DIAGNOSIS — D485 Neoplasm of uncertain behavior of skin: Secondary | ICD-10-CM | POA: Insufficient documentation

## 2015-06-05 DIAGNOSIS — R590 Localized enlarged lymph nodes: Secondary | ICD-10-CM | POA: Insufficient documentation

## 2015-06-05 LAB — URINALYSIS, ROUTINE W REFLEX MICROSCOPIC
Bilirubin Urine: NEGATIVE
Hgb urine dipstick: NEGATIVE
KETONES UR: NEGATIVE
Nitrite: NEGATIVE
PH: 5.5 (ref 5.0–8.0)
SPECIFIC GRAVITY, URINE: 1.02 (ref 1.000–1.030)
TOTAL PROTEIN, URINE-UPE24: NEGATIVE
URINE GLUCOSE: NEGATIVE
Urobilinogen, UA: 0.2 (ref 0.0–1.0)

## 2015-06-05 LAB — CBC WITH DIFFERENTIAL/PLATELET
BASOS PCT: 0.4 % (ref 0.0–3.0)
Basophils Absolute: 0 10*3/uL (ref 0.0–0.1)
Eosinophils Absolute: 0.1 10*3/uL (ref 0.0–0.7)
Eosinophils Relative: 1.7 % (ref 0.0–5.0)
HEMATOCRIT: 42.5 % (ref 36.0–46.0)
HEMOGLOBIN: 14.5 g/dL (ref 12.0–15.0)
LYMPHS ABS: 1.7 10*3/uL (ref 0.7–4.0)
LYMPHS PCT: 31.7 % (ref 12.0–46.0)
MCHC: 34 g/dL (ref 30.0–36.0)
MCV: 92.4 fl (ref 78.0–100.0)
Monocytes Absolute: 0.6 10*3/uL (ref 0.1–1.0)
Monocytes Relative: 10.9 % (ref 3.0–12.0)
NEUTROS ABS: 3 10*3/uL (ref 1.4–7.7)
Neutrophils Relative %: 55.3 % (ref 43.0–77.0)
Platelets: 303 10*3/uL (ref 150.0–400.0)
RBC: 4.6 Mil/uL (ref 3.87–5.11)
RDW: 12.8 % (ref 11.5–15.5)
WBC: 5.4 10*3/uL (ref 4.0–10.5)

## 2015-06-05 LAB — HEPATIC FUNCTION PANEL
ALK PHOS: 108 U/L (ref 39–117)
ALT: 13 U/L (ref 0–35)
AST: 20 U/L (ref 0–37)
Albumin: 4.4 g/dL (ref 3.5–5.2)
BILIRUBIN DIRECT: 0 mg/dL (ref 0.0–0.3)
BILIRUBIN TOTAL: 0.5 mg/dL (ref 0.2–1.2)
TOTAL PROTEIN: 7.8 g/dL (ref 6.0–8.3)

## 2015-06-05 LAB — LIPID PANEL
Cholesterol: 205 mg/dL — ABNORMAL HIGH (ref 0–200)
HDL: 44.6 mg/dL (ref >39.00)
NONHDL: 160.06
Total CHOL/HDL Ratio: 5
Triglycerides: 257 mg/dL — ABNORMAL HIGH (ref 0.0–149.0)
VLDL: 51.4 mg/dL — AB (ref 0.0–40.0)

## 2015-06-05 LAB — TSH: TSH: 5.34 u[IU]/mL — ABNORMAL HIGH (ref 0.35–4.50)

## 2015-06-05 LAB — BASIC METABOLIC PANEL
BUN: 18 mg/dL (ref 6–23)
CALCIUM: 10.2 mg/dL (ref 8.4–10.5)
CO2: 29 meq/L (ref 19–32)
Chloride: 104 mEq/L (ref 96–112)
Creatinine, Ser: 1.08 mg/dL (ref 0.40–1.20)
GFR: 52.4 mL/min — ABNORMAL LOW (ref >60.00)
GLUCOSE: 107 mg/dL — AB (ref 70–99)
Potassium: 4.8 mEq/L (ref 3.5–5.1)
SODIUM: 141 meq/L (ref 135–145)

## 2015-06-05 LAB — LDL CHOLESTEROL, DIRECT: Direct LDL: 125 mg/dL

## 2015-06-05 LAB — VITAMIN D 25 HYDROXY (VIT D DEFICIENCY, FRACTURES): VITD: 25.45 ng/mL — ABNORMAL LOW (ref 30.00–100.00)

## 2015-06-05 LAB — VITAMIN B12: Vitamin B-12: 186 pg/mL — ABNORMAL LOW (ref 211–911)

## 2015-06-05 MED ORDER — ERGOCALCIFEROL 1.25 MG (50000 UT) PO CAPS: 50000.0000 [IU] | ORAL_CAPSULE | ORAL | Status: DC

## 2015-06-05 MED ORDER — AMOXICILLIN 875 MG PO TABS: 875.0000 mg | ORAL_TABLET | Freq: Two times a day (BID) | ORAL | Status: DC

## 2015-06-05 MED ORDER — TRIAMCINOLONE ACETONIDE 0.5 % EX OINT: 1.0000 "application " | TOPICAL_OINTMENT | Freq: Three times a day (TID) | CUTANEOUS | Status: DC

## 2015-06-05 NOTE — Assessment & Plan Note (Signed)
Labs

## 2015-06-05 NOTE — Patient Instructions (Signed)
Preventive Care for Adults, Female A healthy lifestyle and preventive care can promote health and wellness. Preventive health guidelines for women include the following key practices.  A routine yearly physical is a good way to check with your health care provider about your health and preventive screening. It is a chance to share any concerns and updates on your health and to receive a thorough exam.  Visit your dentist for a routine exam and preventive care every 6 months. Brush your teeth twice a day and floss once a day. Good oral hygiene prevents tooth decay and gum disease.  The frequency of eye exams is based on your age, health, family medical history, use of contact lenses, and other factors. Follow your health care provider's recommendations for frequency of eye exams.  Eat a healthy diet. Foods like vegetables, fruits, whole grains, low-fat dairy products, and lean protein foods contain the nutrients you need without too many calories. Decrease your intake of foods high in solid fats, added sugars, and salt. Eat the right amount of calories for you.Get information about a proper diet from your health care provider, if necessary.  Regular physical exercise is one of the most important things you can do for your health. Most adults should get at least 150 minutes of moderate-intensity exercise (any activity that increases your heart rate and causes you to sweat) each week. In addition, most adults need muscle-strengthening exercises on 2 or more days a week.  Maintain a healthy weight. The body mass index (BMI) is a screening tool to identify possible weight problems. It provides an estimate of body fat based on height and weight. Your health care provider can find your BMI and can help you achieve or maintain a healthy weight.For adults 20 years and older:  A BMI below 18.5 is considered underweight.  A BMI of 18.5 to 24.9 is normal.  A BMI of 25 to 29.9 is considered overweight.  A  BMI of 30 and above is considered obese.  Maintain normal blood lipids and cholesterol levels by exercising and minimizing your intake of saturated fat. Eat a balanced diet with plenty of fruit and vegetables. Blood tests for lipids and cholesterol should begin at age 45 and be repeated every 5 years. If your lipid or cholesterol levels are high, you are over 50, or you are at high risk for heart disease, you may need your cholesterol levels checked more frequently.Ongoing high lipid and cholesterol levels should be treated with medicines if diet and exercise are not working.  If you smoke, find out from your health care provider how to quit. If you do not use tobacco, do not start.  Lung cancer screening is recommended for adults aged 45-80 years who are at high risk for developing lung cancer because of a history of smoking. A yearly low-dose CT scan of the lungs is recommended for people who have at least a 30-pack-year history of smoking and are a current smoker or have quit within the past 15 years. A pack year of smoking is smoking an average of 1 pack of cigarettes a day for 1 year (for example: 1 pack a day for 30 years or 2 packs a day for 15 years). Yearly screening should continue until the smoker has stopped smoking for at least 15 years. Yearly screening should be stopped for people who develop a health problem that would prevent them from having lung cancer treatment.  If you are pregnant, do not drink alcohol. If you are  breastfeeding, be very cautious about drinking alcohol. If you are not pregnant and choose to drink alcohol, do not have more than 1 drink per day. One drink is considered to be 12 ounces (355 mL) of beer, 5 ounces (148 mL) of wine, or 1.5 ounces (44 mL) of liquor.  Avoid use of street drugs. Do not share needles with anyone. Ask for help if you need support or instructions about stopping the use of drugs.  High blood pressure causes heart disease and increases the risk  of stroke. Your blood pressure should be checked at least every 1 to 2 years. Ongoing high blood pressure should be treated with medicines if weight loss and exercise do not work.  If you are 55-79 years old, ask your health care provider if you should take aspirin to prevent strokes.  Diabetes screening is done by taking a blood sample to check your blood glucose level after you have not eaten for a certain period of time (fasting). If you are not overweight and you do not have risk factors for diabetes, you should be screened once every 3 years starting at age 45. If you are overweight or obese and you are 40-70 years of age, you should be screened for diabetes every year as part of your cardiovascular risk assessment.  Breast cancer screening is essential preventive care for women. You should practice "breast self-awareness." This means understanding the normal appearance and feel of your breasts and may include breast self-examination. Any changes detected, no matter how small, should be reported to a health care provider. Women in their 20s and 30s should have a clinical breast exam (CBE) by a health care provider as part of a regular health exam every 1 to 3 years. After age 40, women should have a CBE every year. Starting at age 40, women should consider having a mammogram (breast X-ray test) every year. Women who have a family history of breast cancer should talk to their health care provider about genetic screening. Women at a high risk of breast cancer should talk to their health care providers about having an MRI and a mammogram every year.  Breast cancer gene (BRCA)-related cancer risk assessment is recommended for women who have family members with BRCA-related cancers. BRCA-related cancers include breast, ovarian, tubal, and peritoneal cancers. Having family members with these cancers may be associated with an increased risk for harmful changes (mutations) in the breast cancer genes BRCA1 and  BRCA2. Results of the assessment will determine the need for genetic counseling and BRCA1 and BRCA2 testing.  Your health care provider may recommend that you be screened regularly for cancer of the pelvic organs (ovaries, uterus, and vagina). This screening involves a pelvic examination, including checking for microscopic changes to the surface of your cervix (Pap test). You may be encouraged to have this screening done every 3 years, beginning at age 21.  For women ages 30-65, health care providers may recommend pelvic exams and Pap testing every 3 years, or they may recommend the Pap and pelvic exam, combined with testing for human papilloma virus (HPV), every 5 years. Some types of HPV increase your risk of cervical cancer. Testing for HPV may also be done on women of any age with unclear Pap test results.  Other health care providers may not recommend any screening for nonpregnant women who are considered low risk for pelvic cancer and who do not have symptoms. Ask your health care provider if a screening pelvic exam is right for   you.  If you have had past treatment for cervical cancer or a condition that could lead to cancer, you need Pap tests and screening for cancer for at least 20 years after your treatment. If Pap tests have been discontinued, your risk factors (such as having a new sexual partner) need to be reassessed to determine if screening should resume. Some women have medical problems that increase the chance of getting cervical cancer. In these cases, your health care provider may recommend more frequent screening and Pap tests.  Colorectal cancer can be detected and often prevented. Most routine colorectal cancer screening begins at the age of 50 years and continues through age 75 years. However, your health care provider may recommend screening at an earlier age if you have risk factors for colon cancer. On a yearly basis, your health care provider may provide home test kits to check  for hidden blood in the stool. Use of a small camera at the end of a tube, to directly examine the colon (sigmoidoscopy or colonoscopy), can detect the earliest forms of colorectal cancer. Talk to your health care provider about this at age 50, when routine screening begins. Direct exam of the colon should be repeated every 5-10 years through age 75 years, unless early forms of precancerous polyps or small growths are found.  People who are at an increased risk for hepatitis B should be screened for this virus. You are considered at high risk for hepatitis B if:  You were born in a country where hepatitis B occurs often. Talk with your health care provider about which countries are considered high risk.  Your parents were born in a high-risk country and you have not received a shot to protect against hepatitis B (hepatitis B vaccine).  You have HIV or AIDS.  You use needles to inject street drugs.  You live with, or have sex with, someone who has hepatitis B.  You get hemodialysis treatment.  You take certain medicines for conditions like cancer, organ transplantation, and autoimmune conditions.  Hepatitis C blood testing is recommended for all people born from 1945 through 1965 and any individual with known risks for hepatitis C.  Practice safe sex. Use condoms and avoid high-risk sexual practices to reduce the spread of sexually transmitted infections (STIs). STIs include gonorrhea, chlamydia, syphilis, trichomonas, herpes, HPV, and human immunodeficiency virus (HIV). Herpes, HIV, and HPV are viral illnesses that have no cure. They can result in disability, cancer, and death.  You should be screened for sexually transmitted illnesses (STIs) including gonorrhea and chlamydia if:  You are sexually active and are younger than 24 years.  You are older than 24 years and your health care provider tells you that you are at risk for this type of infection.  Your sexual activity has changed  since you were last screened and you are at an increased risk for chlamydia or gonorrhea. Ask your health care provider if you are at risk.  If you are at risk of being infected with HIV, it is recommended that you take a prescription medicine daily to prevent HIV infection. This is called preexposure prophylaxis (PrEP). You are considered at risk if:  You are sexually active and do not regularly use condoms or know the HIV status of your partner(s).  You take drugs by injection.  You are sexually active with a partner who has HIV.  Talk with your health care provider about whether you are at high risk of being infected with HIV. If   you choose to begin PrEP, you should first be tested for HIV. You should then be tested every 3 months for as long as you are taking PrEP.  Osteoporosis is a disease in which the bones lose minerals and strength with aging. This can result in serious bone fractures or breaks. The risk of osteoporosis can be identified using a bone density scan. Women ages 67 years and over and women at risk for fractures or osteoporosis should discuss screening with their health care providers. Ask your health care provider whether you should take a calcium supplement or vitamin D to reduce the rate of osteoporosis.  Menopause can be associated with physical symptoms and risks. Hormone replacement therapy is available to decrease symptoms and risks. You should talk to your health care provider about whether hormone replacement therapy is right for you.  Use sunscreen. Apply sunscreen liberally and repeatedly throughout the day. You should seek shade when your shadow is shorter than you. Protect yourself by wearing long sleeves, pants, a wide-brimmed hat, and sunglasses year round, whenever you are outdoors.  Once a month, do a whole body skin exam, using a mirror to look at the skin on your back. Tell your health care provider of new moles, moles that have irregular borders, moles that  are larger than a pencil eraser, or moles that have changed in shape or color.  Stay current with required vaccines (immunizations).  Influenza vaccine. All adults should be immunized every year.  Tetanus, diphtheria, and acellular pertussis (Td, Tdap) vaccine. Pregnant women should receive 1 dose of Tdap vaccine during each pregnancy. The dose should be obtained regardless of the length of time since the last dose. Immunization is preferred during the 27th-36th week of gestation. An adult who has not previously received Tdap or who does not know her vaccine status should receive 1 dose of Tdap. This initial dose should be followed by tetanus and diphtheria toxoids (Td) booster doses every 10 years. Adults with an unknown or incomplete history of completing a 3-dose immunization series with Td-containing vaccines should begin or complete a primary immunization series including a Tdap dose. Adults should receive a Td booster every 10 years.  Varicella vaccine. An adult without evidence of immunity to varicella should receive 2 doses or a second dose if she has previously received 1 dose. Pregnant females who do not have evidence of immunity should receive the first dose after pregnancy. This first dose should be obtained before leaving the health care facility. The second dose should be obtained 4-8 weeks after the first dose.  Human papillomavirus (HPV) vaccine. Females aged 13-26 years who have not received the vaccine previously should obtain the 3-dose series. The vaccine is not recommended for use in pregnant females. However, pregnancy testing is not needed before receiving a dose. If a female is found to be pregnant after receiving a dose, no treatment is needed. In that case, the remaining doses should be delayed until after the pregnancy. Immunization is recommended for any person with an immunocompromised condition through the age of 61 years if she did not get any or all doses earlier. During the  3-dose series, the second dose should be obtained 4-8 weeks after the first dose. The third dose should be obtained 24 weeks after the first dose and 16 weeks after the second dose.  Zoster vaccine. One dose is recommended for adults aged 30 years or older unless certain conditions are present.  Measles, mumps, and rubella (MMR) vaccine. Adults born  before 1957 generally are considered immune to measles and mumps. Adults born in 1957 or later should have 1 or more doses of MMR vaccine unless there is a contraindication to the vaccine or there is laboratory evidence of immunity to each of the three diseases. A routine second dose of MMR vaccine should be obtained at least 28 days after the first dose for students attending postsecondary schools, health care workers, or international travelers. People who received inactivated measles vaccine or an unknown type of measles vaccine during 1963-1967 should receive 2 doses of MMR vaccine. People who received inactivated mumps vaccine or an unknown type of mumps vaccine before 1979 and are at high risk for mumps infection should consider immunization with 2 doses of MMR vaccine. For females of childbearing age, rubella immunity should be determined. If there is no evidence of immunity, females who are not pregnant should be vaccinated. If there is no evidence of immunity, females who are pregnant should delay immunization until after pregnancy. Unvaccinated health care workers born before 1957 who lack laboratory evidence of measles, mumps, or rubella immunity or laboratory confirmation of disease should consider measles and mumps immunization with 2 doses of MMR vaccine or rubella immunization with 1 dose of MMR vaccine.  Pneumococcal 13-valent conjugate (PCV13) vaccine. When indicated, a person who is uncertain of his immunization history and has no record of immunization should receive the PCV13 vaccine. All adults 65 years of age and older should receive this  vaccine. An adult aged 19 years or older who has certain medical conditions and has not been previously immunized should receive 1 dose of PCV13 vaccine. This PCV13 should be followed with a dose of pneumococcal polysaccharide (PPSV23) vaccine. Adults who are at high risk for pneumococcal disease should obtain the PPSV23 vaccine at least 8 weeks after the dose of PCV13 vaccine. Adults older than 76 years of age who have normal immune system function should obtain the PPSV23 vaccine dose at least 1 year after the dose of PCV13 vaccine.  Pneumococcal polysaccharide (PPSV23) vaccine. When PCV13 is also indicated, PCV13 should be obtained first. All adults aged 65 years and older should be immunized. An adult younger than age 65 years who has certain medical conditions should be immunized. Any person who resides in a nursing home or long-term care facility should be immunized. An adult smoker should be immunized. People with an immunocompromised condition and certain other conditions should receive both PCV13 and PPSV23 vaccines. People with human immunodeficiency virus (HIV) infection should be immunized as soon as possible after diagnosis. Immunization during chemotherapy or radiation therapy should be avoided. Routine use of PPSV23 vaccine is not recommended for American Indians, [place] Natives, or people younger than 65 years unless there are medical conditions that require PPSV23 vaccine. When indicated, people who have unknown immunization and have no record of immunization should receive PPSV23 vaccine. One-time revaccination 5 years after the first dose of PPSV23 is recommended for people aged 19-64 years who have chronic kidney failure, nephrotic syndrome, asplenia, or immunocompromised conditions. People who received 1-2 doses of PPSV23 before age 65 years should receive another dose of PPSV23 vaccine at age 65 years or later if at least 5 years have passed since the previous dose. Doses of PPSV23 are not  needed for people immunized with PPSV23 at or after age 65 years.  Meningococcal vaccine. Adults with asplenia or persistent complement component deficiencies should receive 2 doses of quadrivalent meningococcal conjugate (MenACWY-D) vaccine. The doses should be obtained   at least 2 months apart. Microbiologists working with certain meningococcal bacteria, [place] recruits, people at risk during an outbreak, and people who travel to or live in countries with a high rate of meningitis should be immunized. A first-year college student up through age 34 years who is living in a residence hall should receive a dose if she did not receive a dose on or after her 16th birthday. Adults who have certain high-risk conditions should receive one or more doses of vaccine.  Hepatitis A vaccine. Adults who wish to be protected from this disease, have certain high-risk conditions, work with hepatitis A-infected animals, work in hepatitis A research labs, or travel to or work in countries with a high rate of hepatitis A should be immunized. Adults who were previously unvaccinated and who anticipate close contact with an international adoptee during the first 60 days after arrival in the [place] from a country with a high rate of hepatitis A should be immunized.  Hepatitis B vaccine. Adults who wish to be protected from this disease, have certain high-risk conditions, may be exposed to blood or other infectious body fluids, are household contacts or sex partners of hepatitis B positive people, are clients or workers in certain care facilities, or travel to or work in countries with a high rate of hepatitis B should be immunized.  Haemophilus influenzae type b (Hib) vaccine. A previously unvaccinated person with asplenia or sickle cell disease or having a scheduled splenectomy should receive 1 dose of Hib vaccine. Regardless of previous immunization, a recipient of a hematopoietic stem cell transplant should receive a  3-dose series 6-12 months after her successful transplant. Hib vaccine is not recommended for adults with HIV infection. Preventive Services / Frequency Ages 35 to 4 years  Blood pressure check.** / Every 3-5 years.  Lipid and cholesterol check.** / Every 5 years beginning at age 60.  Clinical breast exam.** / Every 3 years for women in their 71s and 10s.  BRCA-related cancer risk assessment.** / For women who have family members with a BRCA-related cancer (breast, ovarian, tubal, or peritoneal cancers).  Pap test.** / Every 2 years from ages 76 through 26. Every 3 years starting at age 61 through age 76 or 93 with a history of 3 consecutive normal Pap tests.  HPV screening.** / Every 3 years from ages 37 through ages 60 to 51 with a history of 3 consecutive normal Pap tests.  Hepatitis C blood test.** / For any individual with known risks for hepatitis C.  Skin self-exam. / Monthly.  Influenza vaccine. / Every year.  Tetanus, diphtheria, and acellular pertussis (Tdap, Td) vaccine.** / Consult your health care provider. Pregnant women should receive 1 dose of Tdap vaccine during each pregnancy. 1 dose of Td every 10 years.  Varicella vaccine.** / Consult your health care provider. Pregnant females who do not have evidence of immunity should receive the first dose after pregnancy.  HPV vaccine. / 3 doses over 6 months, if 93 and younger. The vaccine is not recommended for use in pregnant females. However, pregnancy testing is not needed before receiving a dose.  Measles, mumps, rubella (MMR) vaccine.** / You need at least 1 dose of MMR if you were born in 1957 or later. You may also need a 2nd dose. For females of childbearing age, rubella immunity should be determined. If there is no evidence of immunity, females who are not pregnant should be vaccinated. If there is no evidence of immunity, females who are  pregnant should delay immunization until after pregnancy.  Pneumococcal  13-valent conjugate (PCV13) vaccine.** / Consult your health care provider.  Pneumococcal polysaccharide (PPSV23) vaccine.** / 1 to 2 doses if you smoke cigarettes or if you have certain conditions.  Meningococcal vaccine.** / 1 dose if you are age 68 to 8 years and a Market researcher living in a residence hall, or have one of several medical conditions, you need to get vaccinated against meningococcal disease. You may also need additional booster doses.  Hepatitis A vaccine.** / Consult your health care provider.  Hepatitis B vaccine.** / Consult your health care provider.  Haemophilus influenzae type b (Hib) vaccine.** / Consult your health care provider. Ages 7 to 53 years  Blood pressure check.** / Every year.  Lipid and cholesterol check.** / Every 5 years beginning at age 25 years.  Lung cancer screening. / Every year if you are aged 11-80 years and have a 30-pack-year history of smoking and currently smoke or have quit within the past 15 years. Yearly screening is stopped once you have quit smoking for at least 15 years or develop a health problem that would prevent you from having lung cancer treatment.  Clinical breast exam.** / Every year after age 48 years.  BRCA-related cancer risk assessment.** / For women who have family members with a BRCA-related cancer (breast, ovarian, tubal, or peritoneal cancers).  Mammogram.** / Every year beginning at age 41 years and continuing for as long as you are in good health. Consult with your health care provider.  Pap test.** / Every 3 years starting at age 65 years through age 37 or 70 years with a history of 3 consecutive normal Pap tests.  HPV screening.** / Every 3 years from ages 72 years through ages 60 to 40 years with a history of 3 consecutive normal Pap tests.  Fecal occult blood test (FOBT) of stool. / Every year beginning at age 21 years and continuing until age 5 years. You may not need to do this test if you get  a colonoscopy every 10 years.  Flexible sigmoidoscopy or colonoscopy.** / Every 5 years for a flexible sigmoidoscopy or every 10 years for a colonoscopy beginning at age 35 years and continuing until age 48 years.  Hepatitis C blood test.** / For all people born from [birth year] through 1965 and any individual with known risks for hepatitis C.  Skin self-exam. / Monthly.  Influenza vaccine. / Every year.  Tetanus, diphtheria, and acellular pertussis (Tdap/Td) vaccine.** / Consult your health care provider. Pregnant women should receive 1 dose of Tdap vaccine during each pregnancy. 1 dose of Td every 10 years.  Varicella vaccine.** / Consult your health care provider. Pregnant females who do not have evidence of immunity should receive the first dose after pregnancy.  Zoster vaccine.** / 1 dose for adults aged 30 years or older.  Measles, mumps, rubella (MMR) vaccine.** / You need at least 1 dose of MMR if you were born in 1957 or later. You may also need a second dose. For females of childbearing age, rubella immunity should be determined. If there is no evidence of immunity, females who are not pregnant should be vaccinated. If there is no evidence of immunity, females who are pregnant should delay immunization until after pregnancy.  Pneumococcal 13-valent conjugate (PCV13) vaccine.** / Consult your health care provider.  Pneumococcal polysaccharide (PPSV23) vaccine.** / 1 to 2 doses if you smoke cigarettes or if you have certain conditions.  Meningococcal vaccine.** /  Consult your health care provider.  Hepatitis A vaccine.** / Consult your health care provider.  Hepatitis B vaccine.** / Consult your health care provider.  Haemophilus influenzae type b (Hib) vaccine.** / Consult your health care provider. Ages 64 years and over  Blood pressure check.** / Every year.  Lipid and cholesterol check.** / Every 5 years beginning at age 23 years.  Lung cancer screening. / Every year if you  are aged 16-80 years and have a 30-pack-year history of smoking and currently smoke or have quit within the past 15 years. Yearly screening is stopped once you have quit smoking for at least 15 years or develop a health problem that would prevent you from having lung cancer treatment.  Clinical breast exam.** / Every year after age 74 years.  BRCA-related cancer risk assessment.** / For women who have family members with a BRCA-related cancer (breast, ovarian, tubal, or peritoneal cancers).  Mammogram.** / Every year beginning at age 44 years and continuing for as long as you are in good health. Consult with your health care provider.  Pap test.** / Every 3 years starting at age 58 years through age 22 or 39 years with 3 consecutive normal Pap tests. Testing can be stopped between 65 and 70 years with 3 consecutive normal Pap tests and no abnormal Pap or HPV tests in the past 10 years.  HPV screening.** / Every 3 years from ages 64 years through ages 70 or 61 years with a history of 3 consecutive normal Pap tests. Testing can be stopped between 65 and 70 years with 3 consecutive normal Pap tests and no abnormal Pap or HPV tests in the past 10 years.  Fecal occult blood test (FOBT) of stool. / Every year beginning at age 40 years and continuing until age 27 years. You may not need to do this test if you get a colonoscopy every 10 years.  Flexible sigmoidoscopy or colonoscopy.** / Every 5 years for a flexible sigmoidoscopy or every 10 years for a colonoscopy beginning at age 7 years and continuing until age 32 years.  Hepatitis C blood test.** / For all people born from [birth year] through 1966 and any individual with known risks for hepatitis C.  Osteoporosis screening.** / A one-time screening for women ages 30 years and over and women at risk for fractures or osteoporosis.  Skin self-exam. / Monthly.  Influenza vaccine. / Every year.  Tetanus, diphtheria, and acellular pertussis (Tdap/Td)  vaccine.** / 1 dose of Td every 10 years.  Varicella vaccine.** / Consult your health care provider.  Zoster vaccine.** / 1 dose for adults aged 35 years or older.  Pneumococcal 13-valent conjugate (PCV13) vaccine.** / Consult your health care provider.  Pneumococcal polysaccharide (PPSV23) vaccine.** / 1 dose for all adults aged 46 years and older.  Meningococcal vaccine.** / Consult your health care provider.  Hepatitis A vaccine.** / Consult your health care provider.  Hepatitis B vaccine.** / Consult your health care provider.  Haemophilus influenzae type b (Hib) vaccine.** / Consult your health care provider. ** Family history and personal history of risk and conditions may change your health care provider's recommendations.   This information is not intended to replace advice given to you by your health care provider. Make sure you discuss any questions you have with your health care provider.   Document Released: 03/26/2001 Document Revised: 02/18/2014 Document Reviewed: 06/25/2010 Elsevier Interactive Patient Education [employer].

## 2015-06-05 NOTE — Assessment & Plan Note (Signed)
Here for medicare wellness/physical  Diet: heart healthy  Physical activity: not sedentary  Depression/mood screen: negative  Hearing: intact to whispered voice  Visual acuity: grossly normal, performs annual eye exam  ADLs: capable  Fall risk: low to none  Home safety: good  Cognitive evaluation: intact to orientation, naming, recall and repetition  EOL planning: adv directives, full code/ I agree  I have personally reviewed and have noted  1. The patient's medical, surgical and social history  2. Their use of alcohol, tobacco or illicit drugs  3. Their current medications and supplements  4. The patient's functional ability including ADL's, fall risks, home safety risks and hearing or visual impairment.  5. Diet and physical activities  6. Evidence for depression or mood disorders 7. The roster of all physicians providing medical care to patient - is listed in the Snapshot section of the chart and reviewed today.    Today patient counseled on age appropriate routine health concerns for screening and prevention, each reviewed and up to date or declined. Immunizations reviewed and up to date or declined. Labs ordered and reviewed. Risk factors for depression reviewed and negative. Hearing function and visual acuity are intact. ADLs screened and addressed as needed. Functional ability and level of safety reviewed and appropriate. Education, counseling and referrals performed based on assessed risks today. Patient provided with a copy of personalized plan for preventive services.  Mammo q May Colon Dr Earlean Shawl

## 2015-06-05 NOTE — Progress Notes (Signed)
Subjective:  Patient ID: [name] [name], female    DOB: 07/06/1940  Age: 75 y.o. MRN: [id number]  CC: No chief complaint on file.   HPI [name] [name] presents for a well exam F/u B12 def, dyslipidemia, hypothyroidism C/o fatigue  Outpatient Prescriptions Prior to Visit  Medication Sig Dispense Refill  . ALPRAZolam (XANAX) 0.25 MG tablet Take 0.25 mg by mouth 2 (two) times daily as needed.      . cetirizine (ZYRTEC) 10 MG tablet Take 10 mg by mouth daily.    . CRESTOR 20 MG tablet TAKE 1 TABLET BY MOUTH DAILY 90 tablet 2  . cyanocobalamin (,VITAMIN B-12,) 1000 MCG/ML injection Inject 1 mL (1,000 mcg total) into the skin every 30 (thirty) days. 1 ml q 2 wks 10 mL 6  . fluticasone (FLONASE) 50 MCG/ACT nasal spray Place 2 sprays into both nostrils daily. 16 g 6  . levothyroxine (SYNTHROID, LEVOTHROID) 112 MCG tablet Take 1 tablet (112 mcg total) by mouth daily. 90 tablet 3  . losartan (COZAAR) 100 MG tablet TAKE 1 TABLET BY MOUTH EVERY DAY FOR BLOOD PRESSURE 30 tablet 11  . mometasone (ELOCON) 0.1 % ointment Apply topically 2 (two) times daily as needed. 45 g 0  . Syringe/Needle, Disp, (B-D ECLIPSE SYRINGE) 30G X 1/2" 1 ML MISC 1 each by Does not apply route 1 day or 1 dose. For Vit B12 inj 50 each 11  . cholecalciferol (VITAMIN D) 1000 UNITS tablet Take 2 tablets (2,000 Units total) by mouth daily. 100 tablet 3  . traMADol (ULTRAM) 50 MG tablet Take 1 tablet (50 mg total) by mouth every 8 (eight) hours as needed. (Patient not taking: Reported on 06/05/2015) 30 tablet 0   No facility-administered medications prior to visit.    ROS Review of Systems  Constitutional: Negative for chills, activity change, appetite change, fatigue and unexpected weight change.  HENT: Negative for congestion, mouth sores and sinus pressure.   Eyes: Negative for visual disturbance.  Respiratory: Negative for cough and chest tightness.   Gastrointestinal: Negative for nausea and abdominal pain.    Genitourinary: Negative for frequency, difficulty urinating and vaginal pain.  Musculoskeletal: Negative for back pain and gait problem.  Skin: Positive for pallor. Negative for rash.  Neurological: Negative for dizziness, tremors, weakness, numbness and headaches.  Psychiatric/Behavioral: Negative for suicidal ideas, confusion and sleep disturbance. The patient is not nervous/anxious.     Objective:  BP 120/80 mmHg  Pulse 95  Temp(Src) 97.4 F (36.3 C) (Oral)  Ht 5' 4.5" (1.638 m)  Wt 141 lb 8 oz (64.184 kg)  BMI 23.92 kg/m2  SpO2 96%  BP Readings from Last 3 Encounters:  06/05/15 120/80  10/11/14 124/84  06/07/14 112/82    Wt Readings from Last 3 Encounters:  06/05/15 141 lb 8 oz (64.184 kg)  10/11/14 141 lb (63.957 kg)  06/07/14 143 lb 12 oz (65.205 kg)    Physical Exam  Constitutional: She appears well-developed. No distress.  HENT:  Head: Normocephalic.  Right Ear: External ear normal.  Left Ear: External ear normal.  Nose: Nose normal.  Mouth/Throat: Oropharynx is clear and moist.  Eyes: Conjunctivae are normal. Pupils are equal, round, and reactive to light. Right eye exhibits no discharge. Left eye exhibits no discharge.  Neck: Normal range of motion. Neck supple. No JVD present. No tracheal deviation present. No thyromegaly present.  Cardiovascular: Normal rate, regular rhythm and normal heart sounds.   Pulmonary/Chest: No stridor. No respiratory distress. She has  no wheezes.  Abdominal: Soft. Bowel sounds are normal. She exhibits no distension and no mass. There is no tenderness. There is no rebound and no guarding.  Musculoskeletal: She exhibits no edema or tenderness.  Lymphadenopathy:    She has no cervical adenopathy.  Neurological: She displays normal reflexes. No cranial nerve deficit. She exhibits normal muscle tone. Coordination normal.  Skin: No rash noted. No erythema.  Psychiatric: She has a normal mood and affect. Her behavior is normal. Judgment  and thought content normal.   L forearm 8 mm nodule   Lab Results  Component Value Date   WBC 6.2 06/07/2014   HGB 14.4 06/07/2014   HCT 42.2 06/07/2014   PLT 258.0 06/07/2014   GLUCOSE 122* 03/15/2014   CHOL 157 03/15/2014   TRIG 303.0* 03/15/2014   HDL 41.80 03/15/2014   LDLDIRECT 82.0 03/15/2014   LDLCALC 122* 12/04/2007   ALT 17 06/07/2014   AST 21 06/07/2014   NA 140 03/15/2014   K 4.4 03/15/2014   CL 104 03/15/2014   CREATININE 1.18 03/15/2014   BUN 17 03/15/2014   CO2 29 03/15/2014   TSH 4.59* 06/07/2014   HGBA1C 5.4 12/05/2010    Mm Screening Breast Tomo Uni L  06/15/2014  CLINICAL DATA:  Screening. EXAM: DIGITAL SCREENING UNILATERAL LEFT MAMMOGRAM WITH TOMO AND CAD COMPARISON:  Previous exam(s). ACR Breast Density Category a: The breast tissue is almost entirely fatty. FINDINGS: There are no findings suspicious for malignancy. Images were processed with CAD. IMPRESSION: No mammographic evidence of malignancy. A result letter of this screening mammogram will be mailed directly to the patient. RECOMMENDATION: Screening mammogram in one year. (Code:SM-B-01Y) BI-RADS CATEGORY  1: Negative. Electronically Signed   By: Lovey Newcomer M.D.   On: 06/15/2014 12:53    Assessment & Plan:   There are no diagnoses linked to this encounter. I am having [name] [name] maintain her ALPRAZolam, Syringe/Needle (Disp), cetirizine, cholecalciferol, fluticasone, cyanocobalamin, levothyroxine, CRESTOR, mometasone, traMADol, losartan, and DULoxetine.  Meds ordered this encounter  Medications  . DULoxetine (CYMBALTA) 60 MG capsule    Sig: Take 60 mg by mouth daily.     Follow-up: No Follow-up on file.  Walker Kehr, MD

## 2015-06-05 NOTE — Assessment & Plan Note (Signed)
4/17 L forearm Skin bx w/me

## 2015-06-05 NOTE — Assessment & Plan Note (Signed)
4/17 L submand due to tooth infection Amoxicillin x10 d [facility] appt today

## 2015-06-05 NOTE — Progress Notes (Signed)
Pre visit review using our clinic review tool, if applicable. No additional management support is needed unless otherwise documented below in the visit note. 

## 2015-06-08 ENCOUNTER — Other Ambulatory Visit: Payer: Self-pay | Admitting: *Deleted

## 2015-06-08 MED ORDER — CYANOCOBALAMIN 1000 MCG/ML IJ SOLN: 1000.0000 ug | INTRAMUSCULAR | Status: DC

## 2015-06-10 ENCOUNTER — Ambulatory Visit (INDEPENDENT_AMBULATORY_CARE_PROVIDER_SITE_OTHER): Payer: BLUE CROSS/BLUE SHIELD | Admitting: Family Medicine

## 2015-06-10 ENCOUNTER — Encounter: Payer: Self-pay | Admitting: Family Medicine

## 2015-06-10 VITALS — BP 138/89 | HR 96 | Temp 98.0°F | Ht 64.5 in | Wt 143.0 lb

## 2015-06-10 DIAGNOSIS — T8090XA Unspecified complication following infusion and therapeutic injection, initial encounter: Secondary | ICD-10-CM | POA: Diagnosis not present

## 2015-06-10 NOTE — Progress Notes (Signed)
Pre visit review using our clinic review tool, if applicable. No additional management support is needed unless otherwise documented below in the visit note. 

## 2015-06-10 NOTE — Patient Instructions (Signed)
Take aleve, 2 tabs every 8 hours  Take zyrtec 10mg  once daily.  You may also take 25mg  of otc generic benadryl once a day if the discomfort and redness of your arm are significantly bothersome.

## 2015-06-10 NOTE — Progress Notes (Signed)
OFFICE VISIT  06/10/2015   CC:  Chief Complaint  Patient presents with  . Rash    Positive at Prevnar 13 injection site. Occured on 4/24 redness + swelling x 1 day burning x 4 days    HPI:    Patient is a 75 y.o. Caucasian female who presents to Saturday clinic for suspicion of injection site reaction where she got prevnar 13 five days ago. The pain, heat, and swelling started the day after the injection.  Pt noted a rash at the site this morning.  Currently she says it feels warm and she "feels hyper".  No SOB, no swelling of tongue or throat, no other areas of rash or complaint.  Past Medical History  Diagnosis Date  . Thyroid cancer ([place])     Papillary Stage 1 - Dr Loanne Drilling  . S/P thyroidectomy 07/2005    2 cm largest diameter (1 other tiny focus)/ i-131 rx 99 [facility] 08/2005  . Hyperlipidemia   . Hypothyroidism   . Breast cancer ([place]) hx 2004    recurrent 2006 DR Magrinat  . Vitamin B12 deficiency 2009  . Vitamin D deficiency 2009  . Anxiety   . Allergic rhinitis   . Psoriasis   . HTN (hypertension)     Past Surgical History  Procedure Laterality Date  . Thyroidectomy  2007  . Breast lumpectomy    . Mastectomy      Right    Outpatient Prescriptions Prior to Visit  Medication Sig Dispense Refill  . ALPRAZolam (XANAX) 0.25 MG tablet Take 0.25 mg by mouth 2 (two) times daily as needed.      [name] amoxicillin (AMOXIL) 875 MG tablet Take 1 tablet (875 mg total) by mouth 2 (two) times daily. 20 tablet 0  . cetirizine (ZYRTEC) 10 MG tablet Take 10 mg by mouth daily.    . CRESTOR 20 MG tablet TAKE 1 TABLET BY MOUTH DAILY 90 tablet 2  . cyanocobalamin (,VITAMIN B-12,) 1000 MCG/ML injection Inject 1 mL (1,000 mcg total) into the skin every 30 (thirty) days. 1 ml q 2 wks 10 mL 6  . DULoxetine (CYMBALTA) 60 MG capsule Take 60 mg by mouth daily.    . ergocalciferol (VITAMIN D2) 50000 units capsule Take 1 capsule (50,000 Units total) by mouth once a week. 6 capsule 0  . fluticasone  (FLONASE) 50 MCG/ACT nasal spray Place 2 sprays into both nostrils daily. 16 g 6  . levothyroxine (SYNTHROID, LEVOTHROID) 112 MCG tablet Take 1 tablet (112 mcg total) by mouth daily. 90 tablet 3  . losartan (COZAAR) 100 MG tablet TAKE 1 TABLET BY MOUTH EVERY DAY FOR BLOOD PRESSURE 30 tablet 11  . mometasone (ELOCON) 0.1 % ointment Apply topically 2 (two) times daily as needed. 45 g 0  . Syringe/Needle, Disp, (B-D ECLIPSE SYRINGE) 30G X 1/2" 1 ML MISC 1 each by Does not apply route 1 day or 1 dose. For Vit B12 inj 50 each 11  . traMADol (ULTRAM) 50 MG tablet Take 1 tablet (50 mg total) by mouth every 8 (eight) hours as needed. 30 tablet 0  . triamcinolone ointment (KENALOG) 0.5 % Apply 1 application topically 3 (three) times daily. 15 g 1  . cholecalciferol (VITAMIN D) 1000 UNITS tablet Take 2 tablets (2,000 Units total) by mouth daily. 100 tablet 3   No facility-administered medications prior to visit.    Allergies  Allergen Reactions  . Sulfacetamide Sodium-Sulfur     ROS As per HPI  PE: Blood pressure  138/89, pulse 96, temperature 98 F (36.7 C), temperature source Oral, height 5' 4.5" (1.638 m), weight 143 lb (64.864 kg), SpO2 95 %. Gen: Alert, well appearing.  Patient is oriented to person, place, time, and situation. No swelling of face or lips or tongue or throat. SKIN: R arm deltoid area with splotchy, mildly warm erythema that blanches but is nontender.  No nodularity, no hives.  LABS:  None today   Chemistry      Component Value Date/Time   NA 141 06/05/2015 1559   NA 140 10/09/2011 1403   K 4.8 06/05/2015 1559   K 4.0 10/09/2011 1403   CL 104 06/05/2015 1559   CL 104 10/09/2011 1403   CO2 29 06/05/2015 1559   CO2 27 10/09/2011 1403   BUN 18 06/05/2015 1559   BUN 15.0 10/09/2011 1403   CREATININE 1.08 06/05/2015 1559   CREATININE 1.0 10/09/2011 1403      Component Value Date/Time   CALCIUM 10.2 06/05/2015 1559   CALCIUM 9.4 10/09/2011 1403   ALKPHOS 108  06/05/2015 1559   ALKPHOS 98 10/09/2011 1403   AST 20 06/05/2015 1559   AST 22 10/09/2011 1403   ALT 13 06/05/2015 1559   ALT 20 10/09/2011 1403   BILITOT 0.5 06/05/2015 1559   BILITOT 0.80 10/09/2011 1403       IMPRESSION AND PLAN:  Injection site reaction from prevnar 13. Aleve 440 mg q8h until sx's resolve. Zyrtec 10mg  qd until sx's resolve. May take additional 25mg  benadryl qd prn.  An After Visit Summary was printed and given to the patient.  FOLLOW UP: Return if symptoms worsen or fail to improve.  Signed:  Crissie Sickles, MD           06/10/2015

## 2015-06-12 ENCOUNTER — Telehealth: Payer: Self-pay | Admitting: Internal Medicine

## 2015-06-12 MED ORDER — CYANOCOBALAMIN 1000 MCG/ML IJ SOLN: 1000.0000 ug | INTRAMUSCULAR | Status: DC

## 2015-06-12 NOTE — Telephone Encounter (Signed)
Noted Use OTC cortisone cream qid Did she start Vit D and B12? Thx

## 2015-06-12 NOTE — Telephone Encounter (Signed)
Pt informed- she states she DID start VIt D and Vit B12.  She wanted to make you aware that she was seen her 06/10/15 OV note.    She states [facility] never received Vit b12 injection, so I resent it. See meds.

## 2015-06-12 NOTE — Telephone Encounter (Signed)
Yes. Thx.

## 2015-06-12 NOTE — Telephone Encounter (Signed)
Pt called in and wanted Dr Camila Li to know that she had a reaction to the pneumonia shot.  Burning and ictching and really red.  She is taking meds in hopes it goes down but wanted him to know.

## 2015-06-13 ENCOUNTER — Telehealth: Payer: Self-pay

## 2015-06-13 NOTE — Telephone Encounter (Signed)
Patient will call next week to reschedule when it works for her.

## 2015-06-14 ENCOUNTER — Ambulatory Visit (INDEPENDENT_AMBULATORY_CARE_PROVIDER_SITE_OTHER): Payer: BLUE CROSS/BLUE SHIELD | Admitting: Internal Medicine

## 2015-06-14 VITALS — BP 126/88 | HR 110 | Temp 98.3°F | Resp 18 | Ht 64.5 in | Wt 140.0 lb

## 2015-06-14 DIAGNOSIS — S70361A Insect bite (nonvenomous), right thigh, initial encounter: Secondary | ICD-10-CM

## 2015-06-14 DIAGNOSIS — W57XXXA Bitten or stung by nonvenomous insect and other nonvenomous arthropods, initial encounter: Secondary | ICD-10-CM

## 2015-06-14 NOTE — Progress Notes (Signed)
By signing my name below I, Tereasa Coop, attest that this documentation has been prepared under the direction and in the presence of Tami Lin, MD. Electonically Signed. Tereasa Coop, Scribe 06/14/2015 at 3:15 PM  Subjective:    Patient ID: [name] [name], female    DOB: 05/04/1940, 75 y.o.   MRN: [id number]  Chief Complaint  Patient presents with  . Insect Bite    right leg    HPI [name] [name] is a 75 y.o. female who presents to the [facility] complaining of insect bite on her rt inner thigh. There is redness and swelling where pt states that she was bitten. Redness was first noticed yesterday. Pt states that she did not see what bit her rt leg and that she pulled a tick off her left leg yesterday. Pt was outside hiking in the woods yesterday and the day before.  Pt was seen by her PCP 8 days ago and was prescribed the amoxicillin. Pt started taking the amoxicillin 5 days ago and is on a 10 day course.   Patient Active Problem List   Diagnosis Date Noted  . Well adult exam 06/05/2015  . Neoplasm of uncertain behavior of skin 06/05/2015  . Adenopathy, cervical 06/05/2015  . Tachycardia 06/07/2014  . Fatigue 06/07/2014  . Wart viral 10/18/2011  . Hyperglycemia 11/06/2010  . Actinic keratoses 05/25/2010  . Essential hypertension 02/23/2010  . BREAST MASS 12/12/2009  . SHOULDER PAIN 11/22/2009  . Chronic fatigue 11/08/2009  . RLQ PAIN 11/08/2009  . VERTIGO 07/11/2009  . ECZEMA 10/19/2008  . CT, CHEST, ABNORMAL 05/30/2008  . STYE 02/17/2008  . B12 deficiency 12/16/2007  . Vitamin D deficiency 12/16/2007  . HYPOTHYROIDISM, POSTSURGICAL 12/01/2007  . Acute maxillary sinusitis 11/11/2007  . ALLERGIC RESPIRATORY DISEASE, EXTRINSIC 11/11/2007  . THYROID CANCER 08/19/2007  . Dyslipidemia 08/19/2007  . ANXIETY 08/19/2007  . DEPRESSION 08/19/2007  . ALLERGIC RHINITIS 08/19/2007  . BREAST CANCER, HX OF 08/19/2007     Current outpatient  prescriptions:  .  ALPRAZolam (XANAX) 0.25 MG tablet, Take 0.25 mg by mouth 2 (two) times daily as needed.  , Disp: , Rfl:  .  amoxicillin (AMOXIL) 875 MG tablet, Take 1 tablet (875 mg total) by mouth 2 (two) times daily., Disp: 20 tablet, Rfl: 0 .  cetirizine (ZYRTEC) 10 MG tablet, Take 10 mg by mouth daily., Disp: , Rfl:  .  CRESTOR 20 MG tablet, TAKE 1 TABLET BY MOUTH DAILY, Disp: 90 tablet, Rfl: 2 .  cyanocobalamin (,VITAMIN B-12,) 1000 MCG/ML injection, Inject 1 mL (1,000 mcg total) into the skin every 30 (thirty) days. 1 ml q 2 wks, Disp: 10 mL, Rfl: 6 .  DULoxetine (CYMBALTA) 60 MG capsule, Take 60 mg by mouth daily., Disp: , Rfl:  .  ergocalciferol (VITAMIN D2) 50000 units capsule, Take 1 capsule (50,000 Units total) by mouth once a week., Disp: 6 capsule, Rfl: 0 .  fluticasone (FLONASE) 50 MCG/ACT nasal spray, Place 2 sprays into both nostrils daily., Disp: 16 g, Rfl: 6 .  levothyroxine (SYNTHROID, LEVOTHROID) 112 MCG tablet, Take 1 tablet (112 mcg total) by mouth daily., Disp: 90 tablet, Rfl: 3 .  losartan (COZAAR) 100 MG tablet, TAKE 1 TABLET BY MOUTH EVERY DAY FOR BLOOD PRESSURE, Disp: 30 tablet, Rfl: 11 .  mometasone (ELOCON) 0.1 % ointment, Apply topically 2 (two) times daily as needed., Disp: 45 g, Rfl: 0 .  Syringe/Needle, Disp, (B-D ECLIPSE SYRINGE) 30G X 1/2" 1 ML MISC,  1 each by Does not apply route 1 day or 1 dose. For Vit B12 inj, Disp: 50 each, Rfl: 11 .  traMADol (ULTRAM) 50 MG tablet, Take 1 tablet (50 mg total) by mouth every 8 (eight) hours as needed., Disp: 30 tablet, Rfl: 0 .  triamcinolone ointment (KENALOG) 0.5 %, Apply 1 application topically 3 (three) times daily., Disp: 15 g, Rfl: 1 .  cholecalciferol (VITAMIN D) 1000 UNITS tablet, Take 2 tablets (2,000 Units total) by mouth daily., Disp: 100 tablet, Rfl: 3  Allergies  Allergen Reactions  . Sulfacetamide Sodium-Sulfur        Review of Systems  Skin: Positive for color change (redness on rt inner thigh).        Objective:   Physical Exam  Constitutional: She is oriented to person, place, and time. She appears well-developed and well-nourished. No distress.  HENT:  Head: Normocephalic and atraumatic.  Eyes: Conjunctivae are normal. Pupils are equal, round, and reactive to light.  Neck: Neck supple.  Cardiovascular: Normal rate.   Pulmonary/Chest: Effort normal.  Musculoskeletal: Normal range of motion.  Neurological: She is alert and oriented to person, place, and time.  Skin: Skin is warm and dry. Rash noted. No purpura noted. Rash is macular. Rash is not pustular, not vesicular and not urticarial.     Area of erythematous eruption anterior medial rt thigh with central punctum and central clearing with no evidence of cellulitis or vesiculation.  Psychiatric: She has a normal mood and affect. Her behavior is normal.  Nursing note and vitals reviewed. There was a nice picture of this lesion taken with [employer] but which failed to be scanned into the chart and I could not contract the service administrator at the time.   Filed Vitals:   06/14/15 1445  BP: 126/88  Pulse: 110  Temp: 98.3 F (36.8 C)  TempSrc: Oral  Resp: 18  Height: 5' 4.5" (1.638 m)  Weight: 140 lb (63.504 kg)  SpO2: 95%        Assessment & Plan:  Insect bite--- suggesting tick or spider bite Given the course of this rash it is too early to test for tickborne illnesses or start treatment We will follow the prognosis rash over the next 2 weeks She will return for any flulike symptoms or fevers or for spreading of the rash   I have completed the patient encounter in its entirety as documented by the scribe, with editing by me where necessary. Chirstine Defrain P. Laney Pastor, M.D.

## 2015-06-14 NOTE — Patient Instructions (Signed)
     IF you received an x-ray today, you will receive an invoice from [facility]. Please contact [facility] at [phone] with questions or concerns regarding your invoice.   IF you received labwork today, you will receive an invoice from [facility]. Please contact [facility] at [phone] with questions or concerns regarding your invoice.   Our billing staff will not be able to assist you with questions regarding bills from these companies.  You will be contacted with the lab results as soon as they are available. The fastest way to get your results is to activate your My Chart account. Instructions are located on the last page of this paperwork. If you have not heard from us regarding the results in 2 weeks, please contact this office.      

## 2015-06-15 ENCOUNTER — Ambulatory Visit: Payer: BLUE CROSS/BLUE SHIELD | Admitting: Internal Medicine

## 2015-06-15 ENCOUNTER — Ambulatory Visit: Payer: BLUE CROSS/BLUE SHIELD

## 2015-06-16 ENCOUNTER — Other Ambulatory Visit: Payer: Self-pay | Admitting: *Deleted

## 2015-06-16 MED ORDER — CYANOCOBALAMIN 1000 MCG/ML IJ SOLN: INTRAMUSCULAR | Status: DC

## 2015-06-30 ENCOUNTER — Ambulatory Visit: Payer: BLUE CROSS/BLUE SHIELD

## 2015-07-03 ENCOUNTER — Ambulatory Visit
Admission: RE | Admit: 2015-07-03 | Discharge: 2015-07-03 | Disposition: A | Discharge status: Home / Self Care | Payer: BLUE CROSS/BLUE SHIELD | Source: Ambulatory Visit

## 2015-07-03 DIAGNOSIS — Z1231 Encounter for screening mammogram for malignant neoplasm of breast: Secondary | ICD-10-CM

## 2015-07-20 ENCOUNTER — Other Ambulatory Visit: Payer: Self-pay | Admitting: Internal Medicine

## 2015-08-21 ENCOUNTER — Encounter: Payer: Self-pay | Admitting: Family Medicine

## 2015-08-21 ENCOUNTER — Telehealth: Payer: Self-pay | Admitting: Internal Medicine

## 2015-08-21 ENCOUNTER — Ambulatory Visit (INDEPENDENT_AMBULATORY_CARE_PROVIDER_SITE_OTHER): Payer: BLUE CROSS/BLUE SHIELD | Admitting: Family Medicine

## 2015-08-21 ENCOUNTER — Ambulatory Visit (INDEPENDENT_AMBULATORY_CARE_PROVIDER_SITE_OTHER)
Admission: RE | Admit: 2015-08-21 | Discharge: 2015-08-21 | Disposition: A | Discharge status: Home / Self Care | Payer: BLUE CROSS/BLUE SHIELD | Source: Ambulatory Visit | Attending: Family Medicine | Admitting: Family Medicine

## 2015-08-21 VITALS — BP 130/80 | HR 100 | Temp 98.7°F | Resp 12 | Ht 64.5 in | Wt 145.0 lb

## 2015-08-21 DIAGNOSIS — E890 Postprocedural hypothyroidism: Secondary | ICD-10-CM

## 2015-08-21 DIAGNOSIS — K5900 Constipation, unspecified: Secondary | ICD-10-CM

## 2015-08-21 NOTE — Patient Instructions (Addendum)
A few things to remember from today's visit:   [name] I have seen you today for an acute visit because your primary care provider was not available. Monitor for signs of worsening symptoms and seek immediate medical attention if any concerning/warning symptom as we discussed.   Follow up in 1-2 weeks, before if needed.    Constipation, acute - Plan: TSH, Basic metabolic panel, DG Abd 2 Views  HYPOTHYROIDISM, POSTSURGICAL - Plan: TSH  If not able to pass gas, or vomiting, or severe abdominal pain please seek immediate medical attention.  Over-the-counter MiraLAX 1 dose today and tomorrow. Benefiber Adequate hydration and increase fiber intake.

## 2015-08-21 NOTE — Telephone Encounter (Signed)
Pt is being seen in [place] today.

## 2015-08-21 NOTE — Progress Notes (Signed)
Pre visit review using our clinic review tool, if applicable. No additional management support is needed unless otherwise documented below in the visit note. 

## 2015-08-21 NOTE — Telephone Encounter (Signed)
[facility] - Client [place]  Patient Name: [name] [name]  DOB: Jun 28, 1940    Initial Comment Caller is having a problem with constipation, stomach is bloated more of the left side and it is painful *NEEDS 1 MORE ATTEMPT*   Nurse Assessment  Nurse: Wayne Sever, RN, Tillie Rung Date/Time (Eastern Time): 08/21/2015 2:03:14 PM  Confirm and document reason for call. If symptomatic, describe symptoms. You must click the next button to save text entered. ---Caller states she has been having issues for more than a few days. Caller states she had a breast reconstruction in 2004 and she has pain on that side for a while now. She states she has used 3 suppositories over the past 3 or 4 days and she cannot have a BM. She states she is passing gas. She is having a pain on the right side of her stomach close to groin area.  Has the patient traveled out of the country within the last 30 days? ---Not Applicable  Does the patient have any new or worsening symptoms? ---Yes  Will a triage be completed? ---Yes  Related visit to physician within the last 2 weeks? ---No  Does the PT have any chronic conditions? (i.e. diabetes, asthma, etc.) ---Yes  List chronic conditions. ---Breast Cancer in past, HTN  Is this a behavioral health or substance abuse call? ---No     Guidelines    Guideline Title Affirmed Question Affirmed Notes  Constipation Abdomen is more swollen than usual    Final Disposition User   See Physician within 24 Hours [place], RN, Tillie Rung    Comments  Scheduled at 345p today with Betty Martinique   Referrals  REFERRED TO PCP OFFICE   Disagree/Comply: Leta Baptist

## 2015-08-21 NOTE — Progress Notes (Signed)
HPI:  ACUTE VISIT:  Chief Complaint  Patient presents with  . Constipation    Pt started feeling bad 2-3 days ago. Constipation became bad yesterday, pt has tried 3 suppositories.    [name] is a 75 y.o. female, who is here today complaining of 3 days of constipation. Last bowel movement was 3 days ago. She states that she has been eating "a lot of ice cream" for the past 2 months and wonders if this is related. She is concerned about bowel obstruction. + Prior abdominal surgeries. Hx of hypothyroidism.  + Flatus.  + Periumbilical , intermittent, abdominal "discomfort" and nausea. Exacerbated by food intake, no alleviating factors identified.  + "Hard stomach" when she palpates abdomen she can feel full areas, bloating sensation.   Denies fever,chills, decreased appetite, vomiting,blood in stool or melena.  She denies any history of constipation, states that usually she has one or 2 bowel movements per day.    Lab Results  Component Value Date   TSH 5.34* 06/05/2015   Lab Results  Component Value Date   CREATININE 1.08 06/05/2015   BUN 18 06/05/2015   NA 141 06/05/2015   K 4.8 06/05/2015   CL 104 06/05/2015   CO2 29 06/05/2015    History of vitamin D deficiency, currently she is on vitamin D 2000 units over-the-counter, according to pt, she completed treatment with Ergocalciferol 50,000 units daily for a week, she does not remember when she completed treatment.  She also mentions that she's been taking care of her sister, who is receiving chemotherapy for lymphoma, so she has not been eating very healthy.   Review of Systems  Constitutional: Negative for fever, appetite change, fatigue and unexpected weight change.  HENT: Negative for mouth sores, sore throat and trouble swallowing.   Respiratory: Negative for cough, shortness of breath and wheezing.   Cardiovascular: Negative for palpitations and leg swelling.  Gastrointestinal: Positive for  nausea, abdominal pain (discomfort), constipation and abdominal distention. Negative for vomiting, diarrhea and blood in stool.  Endocrine: Negative for cold intolerance and heat intolerance.  Genitourinary: Negative for dysuria, frequency, hematuria, vaginal bleeding and vaginal discharge.  Musculoskeletal: Negative for back pain, arthralgias and neck pain.  Neurological: Negative for syncope, weakness and headaches.  Psychiatric/Behavioral: Negative for confusion. The patient is nervous/anxious.       Current Outpatient Prescriptions on File Prior to Visit  Medication Sig Dispense Refill  . ALPRAZolam (XANAX) 0.25 MG tablet Take 0.25 mg by mouth 2 (two) times daily as needed.      . cetirizine (ZYRTEC) 10 MG tablet Take 10 mg by mouth daily.    . cyanocobalamin (,VITAMIN B-12,) 1000 MCG/ML injection 1 ml q 2 wks 10 mL 6  . DULoxetine (CYMBALTA) 60 MG capsule Take 60 mg by mouth daily.    . ergocalciferol (VITAMIN D2) 50000 units capsule Take 1 capsule (50,000 Units total) by mouth once a week. 6 capsule 0  . levothyroxine (SYNTHROID, LEVOTHROID) 112 MCG tablet TAKE 1 TABLET (112 MCG TOTAL) BY MOUTH DAILY. 90 tablet 3  . losartan (COZAAR) 100 MG tablet TAKE 1 TABLET BY MOUTH EVERY DAY FOR BLOOD PRESSURE 30 tablet 11  . rosuvastatin (CRESTOR) 20 MG tablet TAKE 1 TABLET BY MOUTH DAILY 90 tablet 2  . Syringe/Needle, Disp, (B-D ECLIPSE SYRINGE) 30G X 1/2" 1 ML MISC 1 each by Does not apply route 1 day or 1 dose. For Vit B12 inj 50 each 11  . triamcinolone ointment (  KENALOG) 0.5 % Apply 1 application topically 3 (three) times daily. 15 g 1  . cholecalciferol (VITAMIN D) 1000 UNITS tablet Take 2 tablets (2,000 Units total) by mouth daily. 100 tablet 3   No current facility-administered medications on file prior to visit.     Past Medical History  Diagnosis Date  . Thyroid cancer ([place])     Papillary Stage 1 - Dr Loanne Drilling  . S/P thyroidectomy 07/2005    2 cm largest diameter (1 other tiny  focus)/ i-131 rx 99 [facility] 08/2005  . Hyperlipidemia   . Hypothyroidism   . Breast cancer ([place]) hx 2004    recurrent 2006 DR Magrinat  . Vitamin B12 deficiency 2009  . Vitamin D deficiency 2009  . Anxiety   . Allergic rhinitis   . Psoriasis   . HTN (hypertension)    Allergies  Allergen Reactions  . Sulfacetamide Sodium-Sulfur     Social History   Social History  . Marital Status: Married    Spouse Name: N/A  . Number of Children: 2  . Years of Education: N/A   Occupational History  . 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 Smoking status: Never Smoker   . Smokeless tobacco: None  . Alcohol Use: No  . Drug Use: No  . Sexual Activity: Not Currently   Other Topics Concern  . None   Social History Narrative   GI - Dr Earlean Shawl   Depression - Dr Toy Care   GYN - Dr D Colins          Filed Vitals:   08/21/15 1535  BP: 130/80  Pulse: 100  Temp: 98.7 F (37.1 C)  Resp: 12   Body mass index is 24.51 kg/(m^2).   SpO2 Readings from Last 3 Encounters:  08/21/15 97%  06/14/15 95%  06/10/15 95%      Physical Exam  Constitutional: She is oriented to person, place, and time. She appears well-developed and well-nourished. She does not appear ill. No distress.  HENT:  Head: Atraumatic.  Eyes: Conjunctivae are normal.  Neck: No thyromegaly present.  Cardiovascular: Normal rate and regular rhythm.   Respiratory: Effort normal and breath sounds normal. No respiratory distress.  GI: Soft. Bowel sounds are normal. She exhibits no distension, no abdominal bruit and no mass. There is no hepatomegaly. There is no tenderness.  Musculoskeletal: She exhibits no edema.  Lymphadenopathy:    She has no cervical adenopathy.  Neurological: She is alert and oriented to person, place, and time. She has normal strength. Gait normal.  Skin: Skin is warm. No erythema.  Psychiatric: Her speech is normal. Her mood appears anxious.       ASSESSMENT AND PLAN:     [name] [name] was seen today for constipation.  Diagnoses and all orders for this visit:  Constipation, acute -     TSH -     Basic metabolic panel -     DG Abd 2 Views; Future  HYPOTHYROIDISM, POSTSURGICAL -     TSH    Recommend plain abdominal x-ray. Increase fiber intake, Benefiber is a good option, adequate hydration. She can try over-the-counter MiraLAX 1 dose today and tomorrow. Bland/liquid diet for 24 hours then advance as tolerated. Clearly instructed about warning signs. F/U in 1-2 weeks.      -[name] [name] advised to return or notify a doctor immediately if symptoms worsen or persist or new concerns arise, she voices understanding.  Betty G. Martinique, MD  [facility]. [place] office.

## 2015-08-22 LAB — BASIC METABOLIC PANEL
BUN: 20 mg/dL (ref 6–23)
CHLORIDE: 104 meq/L (ref 96–112)
CO2: 26 mEq/L (ref 19–32)
Calcium: 10.1 mg/dL (ref 8.4–10.5)
Creatinine, Ser: 1.18 mg/dL (ref 0.40–1.20)
GFR: 47.28 mL/min — ABNORMAL LOW (ref >60.00)
Glucose, Bld: 92 mg/dL (ref 70–99)
POTASSIUM: 4.3 meq/L (ref 3.5–5.1)
Sodium: 139 mEq/L (ref 135–145)

## 2015-08-22 LAB — TSH: TSH: 12.55 u[IU]/mL — AB (ref 0.35–4.50)

## 2015-12-05 ENCOUNTER — Ambulatory Visit: Payer: BLUE CROSS/BLUE SHIELD | Admitting: Internal Medicine

## 2015-12-18 ENCOUNTER — Other Ambulatory Visit: Payer: Self-pay | Admitting: Internal Medicine

## 2016-05-07 ENCOUNTER — Ambulatory Visit (INDEPENDENT_AMBULATORY_CARE_PROVIDER_SITE_OTHER): Payer: BLUE CROSS/BLUE SHIELD | Admitting: Internal Medicine

## 2016-05-07 ENCOUNTER — Encounter: Payer: Self-pay | Admitting: Internal Medicine

## 2016-05-07 VITALS — BP 120/88 | HR 94 | Temp 98.8°F | Resp 16 | Ht 63.5 in | Wt 142.5 lb

## 2016-05-07 DIAGNOSIS — I10 Essential (primary) hypertension: Secondary | ICD-10-CM

## 2016-05-07 DIAGNOSIS — E538 Deficiency of other specified B group vitamins: Secondary | ICD-10-CM

## 2016-05-07 DIAGNOSIS — Z0000 Encounter for general adult medical examination without abnormal findings: Secondary | ICD-10-CM | POA: Diagnosis not present

## 2016-05-07 MED ORDER — TRIAMCINOLONE ACETONIDE 0.5 % EX OINT: 1.0000 "application " | TOPICAL_OINTMENT | Freq: Three times a day (TID) | CUTANEOUS | 2 refills | Status: DC

## 2016-05-07 MED ORDER — ROSUVASTATIN CALCIUM 20 MG PO TABS: 20.0000 mg | ORAL_TABLET | Freq: Every day | ORAL | 2 refills | Status: DC

## 2016-05-07 NOTE — Assessment & Plan Note (Addendum)
We discussed age appropriate health related issues, including available/recomended screening tests and vaccinations. We discussed a need for adhering to healthy diet and exercise. Labs/EKG were reviewed/ordered. All questions were answered.  Dr Earlean Shawl

## 2016-05-07 NOTE — Progress Notes (Signed)
Pre-visit discussion using our clinic review tool. No additional management support is needed unless otherwise documented below in the visit note.  

## 2016-05-07 NOTE — Progress Notes (Signed)
Subjective:  Patient ID: [name] [name], female    DOB: 1940-06-07  Age: 76 y.o. MRN: [id number]  CC: Annual Exam   HPI [name] [name] presents for a well exam  Outpatient Medications Prior to Visit  Medication Sig Dispense Refill  . ALPRAZolam (XANAX) 0.25 MG tablet Take 0.25 mg by mouth 2 (two) times daily as needed.      . cetirizine (ZYRTEC) 10 MG tablet Take 10 mg by mouth daily.    . cholecalciferol (VITAMIN D) 1000 UNITS tablet Take 2 tablets (2,000 Units total) by mouth daily. 100 tablet 3  . cyanocobalamin (,VITAMIN B-12,) 1000 MCG/ML injection 1 ml q 2 wks 10 mL 6  . DULoxetine (CYMBALTA) 60 MG capsule Take 60 mg by mouth daily.    [name] levothyroxine (SYNTHROID, LEVOTHROID) 112 MCG tablet TAKE 1 TABLET (112 MCG TOTAL) BY MOUTH DAILY. 90 tablet 3  . losartan (COZAAR) 100 MG tablet TAKE 1 TABLET BY MOUTH EVERY DAY FOR BLOOD PRESSURE 90 tablet 1  . rosuvastatin (CRESTOR) 20 MG tablet TAKE 1 TABLET BY MOUTH DAILY 90 tablet 2  . Syringe/Needle, Disp, (B-D ECLIPSE SYRINGE) 30G X 1/2" 1 ML MISC 1 each by Does not apply route 1 day or 1 dose. For Vit B12 inj 50 each 11  . triamcinolone ointment (KENALOG) 0.5 % Apply 1 application topically 3 (three) times daily. 15 g 1  . ergocalciferol (VITAMIN D2) 50000 units capsule Take 1 capsule (50,000 Units total) by mouth once a week. 6 capsule 0   No facility-administered medications prior to visit.     ROS Review of Systems  Constitutional: Negative for activity change, appetite change, chills, fatigue and unexpected weight change.  HENT: Negative for congestion, mouth sores and sinus pressure.   Eyes: Negative for visual disturbance.  Respiratory: Negative for cough and chest tightness.   Gastrointestinal: Negative for abdominal pain and nausea.  Genitourinary: Negative for difficulty urinating, frequency and vaginal pain.  Musculoskeletal: Negative for back pain and gait problem.  Skin: Negative for pallor and rash.  Neurological:  Negative for dizziness, tremors, weakness, numbness and headaches.  Psychiatric/Behavioral: Negative for confusion, sleep disturbance and suicidal ideas. The patient is not nervous/anxious.     Objective:  There were no vitals taken for this visit.  BP Readings from Last 3 Encounters:  08/21/15 130/80  06/14/15 126/88  06/10/15 138/89    Wt Readings from Last 3 Encounters:  08/21/15 145 lb (65.8 kg)  06/14/15 140 lb (63.5 kg)  06/10/15 143 lb (64.9 kg)    Physical Exam  Constitutional: She appears well-developed. No distress.  HENT:  Head: Normocephalic.  Right Ear: External ear normal.  Left Ear: External ear normal.  Nose: Nose normal.  Mouth/Throat: Oropharynx is clear and moist.  Eyes: Conjunctivae are normal. Pupils are equal, round, and reactive to light. Right eye exhibits no discharge. Left eye exhibits no discharge.  Neck: Normal range of motion. Neck supple. No JVD present. No tracheal deviation present. No thyromegaly present.  Cardiovascular: Normal rate, regular rhythm and normal heart sounds.   Pulmonary/Chest: No stridor. No respiratory distress. She has no wheezes.  Abdominal: Soft. Bowel sounds are normal. She exhibits no distension and no mass. There is no tenderness. There is no rebound and no guarding.  Musculoskeletal: She exhibits no edema or tenderness.  Lymphadenopathy:    She has no cervical adenopathy.  Neurological: She displays normal reflexes. No cranial nerve deficit. She exhibits normal muscle tone. Coordination normal.  Skin:  No rash noted. No erythema.  Psychiatric: She has a normal mood and affect. Her behavior is normal. Judgment and thought content normal.    Lab Results  Component Value Date   WBC 5.4 06/05/2015   HGB 14.5 06/05/2015   HCT 42.5 06/05/2015   PLT 303.0 06/05/2015   GLUCOSE 92 08/21/2015   CHOL 205 (H) 06/05/2015   TRIG 257.0 (H) 06/05/2015   HDL 44.60 06/05/2015   LDLDIRECT 125.0 06/05/2015   LDLCALC 122 (H)  12/04/2007   ALT 13 06/05/2015   AST 20 06/05/2015   NA 139 08/21/2015   K 4.3 08/21/2015   CL 104 08/21/2015   CREATININE 1.18 08/21/2015   BUN 20 08/21/2015   CO2 26 08/21/2015   TSH 12.55 (H) 08/21/2015   HGBA1C 5.4 12/05/2010    Dg Abd 2 Views  Result Date: 08/22/2015 CLINICAL DATA:  Constipation for 2 days.  Nausea, bloating. EXAM: ABDOMEN - 2 VIEW COMPARISON:  CT 11/10/2009 FINDINGS: Nonobstructive bowel gas pattern. No free air organomegaly. No suspicious calcifications. Moderate stool burden in the colon. No acute bony abnormality. IMPRESSION: Moderate stool burden.  No acute findings. Electronically Signed   By: Rolm Baptise M.D.   On: 08/22/2015 08:47    Assessment & Plan:   There are no diagnoses linked to this encounter. I have discontinued [name]'s ergocalciferol. I am also having her maintain her ALPRAZolam, Syringe/Needle (Disp), cetirizine, cholecalciferol, DULoxetine, triamcinolone ointment, cyanocobalamin, levothyroxine, rosuvastatin, losartan, and amoxicillin.  Meds ordered this encounter  Medications  . amoxicillin (AMOXIL) 500 MG capsule    Sig: Take 500 mg by mouth 2 (two) times daily.     Follow-up: No Follow-up on file.  Walker Kehr, MD

## 2016-05-07 NOTE — Assessment & Plan Note (Signed)
On B12 

## 2016-05-07 NOTE — Assessment & Plan Note (Signed)
Losartan 

## 2016-06-04 ENCOUNTER — Other Ambulatory Visit: Payer: Self-pay | Admitting: Internal Medicine

## 2016-06-04 DIAGNOSIS — Z1231 Encounter for screening mammogram for malignant neoplasm of breast: Secondary | ICD-10-CM

## 2016-06-15 ENCOUNTER — Other Ambulatory Visit: Payer: Self-pay | Admitting: Internal Medicine

## 2016-07-03 ENCOUNTER — Ambulatory Visit: Payer: BLUE CROSS/BLUE SHIELD

## 2016-07-16 ENCOUNTER — Ambulatory Visit
Admission: RE | Admit: 2016-07-16 | Discharge: 2016-07-16 | Disposition: A | Discharge status: Home / Self Care | Payer: BLUE CROSS/BLUE SHIELD | Source: Ambulatory Visit | Attending: Internal Medicine | Admitting: Internal Medicine

## 2016-07-16 DIAGNOSIS — Z1231 Encounter for screening mammogram for malignant neoplasm of breast: Secondary | ICD-10-CM

## 2016-07-16 HISTORY — DX: Personal history of irradiation: Z92.3

## 2016-07-16 HISTORY — DX: Personal history of antineoplastic chemotherapy: Z92.21

## 2016-07-17 ENCOUNTER — Other Ambulatory Visit: Payer: Self-pay | Admitting: Internal Medicine

## 2016-07-17 DIAGNOSIS — R928 Other abnormal and inconclusive findings on diagnostic imaging of breast: Secondary | ICD-10-CM

## 2016-07-19 ENCOUNTER — Ambulatory Visit
Admission: RE | Admit: 2016-07-19 | Discharge: 2016-07-19 | Disposition: A | Discharge status: Home / Self Care | Payer: BLUE CROSS/BLUE SHIELD | Source: Ambulatory Visit | Attending: Internal Medicine | Admitting: Internal Medicine

## 2016-07-19 ENCOUNTER — Other Ambulatory Visit: Payer: Self-pay | Admitting: Internal Medicine

## 2016-07-19 DIAGNOSIS — R928 Other abnormal and inconclusive findings on diagnostic imaging of breast: Secondary | ICD-10-CM

## 2016-07-19 DIAGNOSIS — N6320 Unspecified lump in the left breast, unspecified quadrant: Secondary | ICD-10-CM | POA: Diagnosis not present

## 2016-07-22 ENCOUNTER — Ambulatory Visit
Admission: RE | Admit: 2016-07-22 | Discharge: 2016-07-22 | Disposition: A | Discharge status: Home / Self Care | Payer: BLUE CROSS/BLUE SHIELD | Source: Ambulatory Visit | Attending: Internal Medicine | Admitting: Internal Medicine

## 2016-07-22 ENCOUNTER — Other Ambulatory Visit: Payer: Self-pay | Admitting: Internal Medicine

## 2016-07-22 DIAGNOSIS — N6320 Unspecified lump in the left breast, unspecified quadrant: Secondary | ICD-10-CM

## 2016-07-22 DIAGNOSIS — N6324 Unspecified lump in the left breast, lower inner quadrant: Secondary | ICD-10-CM | POA: Diagnosis not present

## 2016-07-22 DIAGNOSIS — C50812 Malignant neoplasm of overlapping sites of left female breast: Secondary | ICD-10-CM | POA: Diagnosis not present

## 2016-07-25 ENCOUNTER — Other Ambulatory Visit: Payer: Self-pay | Admitting: Internal Medicine

## 2016-07-26 DIAGNOSIS — Z9011 Acquired absence of right breast and nipple: Secondary | ICD-10-CM | POA: Diagnosis not present

## 2016-07-26 DIAGNOSIS — C50412 Malignant neoplasm of upper-outer quadrant of left female breast: Secondary | ICD-10-CM | POA: Diagnosis not present

## 2016-07-26 DIAGNOSIS — F329 Major depressive disorder, single episode, unspecified: Secondary | ICD-10-CM | POA: Diagnosis not present

## 2016-07-26 DIAGNOSIS — I10 Essential (primary) hypertension: Secondary | ICD-10-CM | POA: Diagnosis not present

## 2016-07-26 DIAGNOSIS — Z853 Personal history of malignant neoplasm of breast: Secondary | ICD-10-CM | POA: Diagnosis not present

## 2016-07-26 DIAGNOSIS — F419 Anxiety disorder, unspecified: Secondary | ICD-10-CM | POA: Diagnosis not present

## 2016-07-26 DIAGNOSIS — Z85850 Personal history of malignant neoplasm of thyroid: Secondary | ICD-10-CM | POA: Diagnosis not present

## 2016-07-29 ENCOUNTER — Other Ambulatory Visit: Payer: Self-pay | Admitting: Oncology

## 2016-07-29 DIAGNOSIS — Z170 Estrogen receptor positive status [ER+]: Principal | ICD-10-CM

## 2016-07-29 DIAGNOSIS — C50012 Malignant neoplasm of nipple and areola, left female breast: Secondary | ICD-10-CM

## 2016-07-29 DIAGNOSIS — C50412 Malignant neoplasm of upper-outer quadrant of left female breast: Secondary | ICD-10-CM | POA: Insufficient documentation

## 2016-08-01 ENCOUNTER — Telehealth: Payer: Self-pay | Admitting: Oncology

## 2016-08-01 NOTE — Telephone Encounter (Signed)
Spoke with patient re new patient appointment with Gm and lab 6/27 @ 3:30 pm. Date/time per Val - see also 6/20 schedule message. Confirmed demographic and insurance information. Also added supplemental insurance - [employer]. Patient reports she no longer has [employer].

## 2016-08-07 ENCOUNTER — Other Ambulatory Visit (HOSPITAL_BASED_OUTPATIENT_CLINIC_OR_DEPARTMENT_OTHER): Payer: Medicare Other

## 2016-08-07 ENCOUNTER — Ambulatory Visit (HOSPITAL_BASED_OUTPATIENT_CLINIC_OR_DEPARTMENT_OTHER): Payer: Medicare Other | Admitting: Oncology

## 2016-08-07 VITALS — BP 142/85 | HR 82 | Temp 98.0°F | Resp 18 | Ht 63.5 in | Wt 142.8 lb

## 2016-08-07 DIAGNOSIS — C50012 Malignant neoplasm of nipple and areola, left female breast: Secondary | ICD-10-CM | POA: Diagnosis not present

## 2016-08-07 DIAGNOSIS — Z85850 Personal history of malignant neoplasm of thyroid: Secondary | ICD-10-CM

## 2016-08-07 DIAGNOSIS — Z853 Personal history of malignant neoplasm of breast: Secondary | ICD-10-CM | POA: Diagnosis not present

## 2016-08-07 DIAGNOSIS — Z803 Family history of malignant neoplasm of breast: Secondary | ICD-10-CM

## 2016-08-07 DIAGNOSIS — Z170 Estrogen receptor positive status [ER+]: Secondary | ICD-10-CM | POA: Diagnosis not present

## 2016-08-07 DIAGNOSIS — C50112 Malignant neoplasm of central portion of left female breast: Secondary | ICD-10-CM | POA: Diagnosis not present

## 2016-08-07 LAB — CBC WITH DIFFERENTIAL/PLATELET
BASO%: 0.2 % (ref 0.0–2.0)
BASOS ABS: 0 10*3/uL (ref 0.0–0.1)
EOS ABS: 0.1 10*3/uL (ref 0.0–0.5)
EOS%: 2.4 % (ref 0.0–7.0)
HCT: 42.2 % (ref 34.8–46.6)
HGB: 14.2 g/dL (ref 11.6–15.9)
LYMPH%: 43.8 % (ref 14.0–49.7)
MCH: 30.9 pg (ref 25.1–34.0)
MCHC: 33.6 g/dL (ref 31.5–36.0)
MCV: 91.7 fL (ref 79.5–101.0)
MONO#: 0.5 10*3/uL (ref 0.1–0.9)
MONO%: 9.7 % (ref 0.0–14.0)
NEUT#: 2.4 10*3/uL (ref 1.5–6.5)
NEUT%: 43.9 % (ref 38.4–76.8)
Platelets: 229 10*3/uL (ref 145–400)
RBC: 4.6 10*6/uL (ref 3.70–5.45)
RDW: 13.2 % (ref 11.2–14.5)
WBC: 5.4 10*3/uL (ref 3.9–10.3)
lymph#: 2.4 10*3/uL (ref 0.9–3.3)

## 2016-08-07 LAB — COMPREHENSIVE METABOLIC PANEL
ALK PHOS: 110 U/L (ref 40–150)
ALT: 17 U/L (ref 0–55)
ANION GAP: 11 meq/L (ref 3–11)
AST: 23 U/L (ref 5–34)
Albumin: 4 g/dL (ref 3.5–5.0)
BUN: 15.6 mg/dL (ref 7.0–26.0)
CALCIUM: 10.5 mg/dL — AB (ref 8.4–10.4)
CHLORIDE: 105 meq/L (ref 98–109)
CO2: 27 mEq/L (ref 22–29)
Creatinine: 1.3 mg/dL — ABNORMAL HIGH (ref 0.6–1.1)
EGFR: 40 mL/min/{1.73_m2} — ABNORMAL LOW (ref >90)
Glucose: 105 mg/dl (ref 70–140)
POTASSIUM: 4.6 meq/L (ref 3.5–5.1)
Sodium: 143 mEq/L (ref 136–145)
Total Bilirubin: 0.75 mg/dL (ref 0.20–1.20)
Total Protein: 7.8 g/dL (ref 6.4–8.3)

## 2016-08-07 NOTE — Progress Notes (Signed)
ID: [name] [name]   DOB: [birth year]  MR#: [id number]  CSN#:[id number]  PCP: [name] Anger, MD Patient Care Team: [name] Anger, MD as PCP - General [name] [name], [name] Dad, MD as Consulting Physician (Oncology) [name] [name], MD as Consulting Physician (General Surgery) [name] [name], MD as Consulting Physician (General Surgery) [name] Campbell, MD as Consulting Physician (Gastroenterology)  BREAST CANCER HISTORY: I last saw [name] [name] in 2013 when she was released from follow-up from her right breast cancer recurrence in 2004. She is status post right mastectomy and adjuvant chemotherapy for that triple negative tumor. More recently, on 07/16/2016 she underwent left screening mammography at the [facility] on 07/16/2016. This found the breast density to be category B. There was a possible asymmetry in the left breast, and on 07/19/2016 she underwent left diagnostic mammography with tomography and left breast ultrasonography. This confirmed a well circumscribed oval mass at the 9:00 position of the left breast measuring approximately 0.8 cm. This was not palpable. On ultrasonography the mass measured 0.6 cm and it was located at the 9:00 radiant 4 cm from the nipple. The left axilla was sonographically benign.  On June 11 [name] [name] underwent biopsy of the left breast mass in question and this showed ([facility] [id number]) and invasive ductal carcinoma, grade 3, estrogen receptor 5% positive with weak staining intensity, progesterone receptor negative, with an MIB-1 of 80%, and HER-2 nonamplified, the signals ratio being 1.46 and the number per cell 1.90.  Her subsequent history is as detailed below.   INTERVAL HISTORY: [name] was evaluated in the breast clinic 08/07/2016. Her case was also presented at the multidisciplinary breast cancer conference 07/31/2016. At that time a preliminary plan was proposed: Lumpectomy with sentinel lymph node sampling, consideration of chemotherapy (carboplatin ),  followed by adjuvant radiation, then consideration of anti-estrogens.  REVIEW OF SYSTEMS: There were no specific symptoms leading to the original mammogram, which was routinely scheduled. The patient denies unusual headaches, visual changes, nausea, vomiting, stiff neck, dizziness, or gait imbalance. There has been no cough, phlegm production, or pleurisy, no chest pain or pressure, and no change in bowel or bladder habits. The patient denies fever, rash, bleeding, unexplained fatigue or unexplained weight loss. A detailed review of systems was otherwise entirely negative.   PAST MEDICAL HISTORY: Past Medical History:  Diagnosis Date  . Allergic rhinitis   . Anxiety   . Breast cancer ([place]) hx 2004   recurrent 2006 DR [name] [name]  . HTN (hypertension)   . Hyperlipidemia   . Hypothyroidism   . Personal history of chemotherapy 2002  . Personal history of radiation therapy 2002  . Psoriasis   . S/P thyroidectomy 07/2005   2 cm largest diameter (1 other tiny focus)/ i-131 rx 99 [facility] 08/2005  . Thyroid cancer ([place])    Papillary Stage 1 - Dr [name] [name]  . Vitamin B12 deficiency 2009  . Vitamin D deficiency 2009    PAST SURGICAL HISTORY: Past Surgical History:  Procedure Laterality Date  . BREAST LUMPECTOMY    . MASTECTOMY     Right  . REDUCTION MAMMAPLASTY Left 2005  . THYROIDECTOMY  2007    FAMILY HISTORY Family History  Problem Relation Age of Onset  . Stroke Mother   . Allergies Mother   . Asthma Mother   . Clotting disorder Mother   . Heart disease Father 81       MI  . Allergies Sister   . Asthma Sister   . Cancer Sister  Breast  . Asthma Brother   . Allergies Daughter   . Asthma Daughter   . Allergies Sister   Family history includes a sister, a paternal aunt, and a paternal cousin with breast cancer all older then 83 at diagnosis  GYN HISTORY: Menarche age 16, first live birth age 41. She is GX P3. She went through the change of life age 57. She did not take  hormone replacement.  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 Her husband [name] [name] is retired from [employer]. Her children are [name] [name], who works for time [employer] in [place], and [name] [name], who lives in [place] and works for [employer]. The third child, [name] [name], died in an automobile accident at age 76. The patient has one grandchild. 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 Smoking status: Never Smoker  . Smokeless tobacco: Never Used  . Alcohol use No     Colonoscopy:  PAP:  Bone density:  Lipid panel:  Allergies  Allergen Reactions  . Pneumococcal Vaccines Other (See Comments)    Was really sick   . Sulfacetamide Sodium-Sulfur     Current Outpatient Prescriptions  Medication Sig Dispense Refill  . ALPRAZolam (XANAX) 0.25 MG tablet Take 0.25 mg by mouth 2 (two) times daily as needed.      . cyanocobalamin (,VITAMIN B-12,) 1000 MCG/ML injection 1 ml q 2 wks 10 mL 6  . levothyroxine (SYNTHROID, LEVOTHROID) 112 MCG tablet TAKE 1 TABLET (112 MCG TOTAL) BY MOUTH DAILY. 90 tablet 3  . losartan (COZAAR) 100 MG tablet TAKE 1 TABLET BY MOUTH EVERY DAY FOR BLOOD PRESSURE 90 tablet 1  . rosuvastatin (CRESTOR) 20 MG tablet Take 1 tablet (20 mg total) by mouth daily. 90 tablet 2  . Syringe/Needle, Disp, (B-D ECLIPSE SYRINGE) 30G X 1/2" 1 ML MISC 1 each by Does not apply route 1 day or 1 dose. For Vit B12 inj 50 each 11  . cholecalciferol (VITAMIN D) 1000 UNITS tablet Take 2 tablets (2,000 Units total) by mouth daily. 100 tablet 3  . DULoxetine (CYMBALTA) 60 MG capsule Take 60 mg by mouth daily.     No current facility-administered medications for this visit.     OBJECTIVE: [name] Who appears well  Vitals:   08/07/16 1600  BP: (!) 142/85  Pulse: 82  Resp: 18  Temp: 98 F (36.7 C)     Body mass index is 24.9 kg/m.    ECOG FS:  0  Sclerae unicteric, pupils round and equal Oropharynx clear and moist No cervical or supraclavicular adenopathy Lungs no rales or rhonchi Heart regular rate and rhythm Abd soft, nontender, positive bowel sounds MSK no focal spinal tenderness, no upper extremity lymphedema Neuro: nonfocal, well oriented, appropriate affect Breasts: The right breast is status post mastectomy. She has undergone tram reconstruction. There is no evidence of local recurrence. The left breast shows no masses, no skin or nipple changes. Both axillae are benign.  LAB RESULTS: Lab Results  Component Value Date   WBC 5.4 08/07/2016   NEUTROABS 2.4 08/07/2016   HGB 14.2 08/07/2016   HCT 42.2 08/07/2016   MCV 91.7 08/07/2016   PLT 229 08/07/2016      Chemistry      Component Value Date/Time   NA 143 08/07/2016 1538   K 4.6 08/07/2016 1538   CL 104 08/21/2015 1625  CL 104 10/09/2011 1403   CO2 27 08/07/2016 1538   BUN 15.6 08/07/2016 1538   CREATININE 1.3 (H) 08/07/2016 1538      Component Value Date/Time   CALCIUM 10.5 (H) 08/07/2016 1538   ALKPHOS 110 08/07/2016 1538   AST 23 08/07/2016 1538   ALT 17 08/07/2016 1538   BILITOT 0.75 08/07/2016 1538       Lab Results  Component Value Date   LABCA2 16 10/09/2011    No components found for: [id number]  No results for input(s): INR in the last 168 hours.  Urinalysis    Component Value Date/Time   COLORURINE YELLOW 06/05/2015 1559   APPEARANCEUR CLEAR 06/05/2015 1559   LABSPEC 1.020 06/05/2015 1559   PHURINE 5.5 06/05/2015 1559   GLUCOSEU NEGATIVE 06/05/2015 1559   HGBUR NEGATIVE 06/05/2015 1559   BILIRUBINUR NEGATIVE 06/05/2015 1559   KETONESUR NEGATIVE 06/05/2015 1559   UROBILINOGEN 0.2 06/05/2015 1559   NITRITE NEGATIVE 06/05/2015 1559   LEUKOCYTESUR MODERATE (A) 06/05/2015 1559    STUDIES: Mm Digital Screening Unilat L  Result Date: 07/16/2016 CLINICAL DATA:  Screening. History of treated right breast cancer, status post  mastectomy. Left breast reduction mammoplasty in 2005. EXAM: DIGITAL SCREENING UNILATERAL LEFT MAMMOGRAM WITH CAD COMPARISON:  Previous exam(s). ACR Breast Density Category b: There are scattered areas of fibroglandular density. FINDINGS: In the left breast, a possible asymmetry warrants further evaluation. The patient is status post right mastectomy. Images were processed with CAD. IMPRESSION: Further evaluation is suggested for possible asymmetry in the left breast. RECOMMENDATION: Diagnostic mammogram and possibly ultrasound of the left breast. (Code:[id number]) The patient will be contacted regarding the findings, and additional imaging will be scheduled. BI-RADS CATEGORY  0: Incomplete. Need additional imaging evaluation and/or prior mammograms for comparison. Electronically Signed   By: Fidela Salisbury M.D.   On: 07/16/2016 15:30   US Breast Ltd Uni Left Inc Axilla  Result Date: 07/19/2016 CLINICAL DATA:  76 year old asymptomatic female callback from screening mammogram for a mass in the left breast. Patient does have a personal history of right-sided breast cancer status post mastectomy in 2002. Patient does have a significant family history of breast cancer which includes her sister, maternal aunt, and a maternal second cousin. EXAM: 2D DIGITAL DIAGNOSTIC LEFT MAMMOGRAM WITH CAD AND ADJUNCT TOMO ULTRASOUND LEFT BREAST COMPARISON:  Previous exam(s). ACR Breast Density Category b: There are scattered areas of fibroglandular density. FINDINGS: Follow-up 2D and 3D full field mammographic images of the left breast demonstrates persistence of a well-circumscribed, isodense oval mass in the medial left breast at 9 o'clock measuring approximately 8 mm with no associated architectural distortion or microcalcifications. This mass is new when compared to previous images. Mammographic images were processed with CAD. On physical exam, no abnormality is palpated in the medial left breast Targeted ultrasound is  performed, showing a well-circumscribed, slightly irregular hypoechoic mass in the left breast at 9 o'clock, 4 cm from the nipple measuring 6 x 4 x 6 mm. There is no internal vascularity or posterior shadowing. This correlates well with the mass identified on mammography. Interrogation of the left axilla demonstrates no lymphadenopathy. IMPRESSION: Indeterminate mass in the left breast RECOMMENDATION: Recommend ultrasound-guided needle core biopsy of the mass in the left breast. We will assist the patient in arranging this procedure. I have discussed the findings and recommendations with the patient. Results were also provided in writing at the conclusion of the visit. If applicable, a reminder letter will be sent to the patient regarding  the next appointment. BI-RADS CATEGORY  4: Suspicious. Electronically Signed   By: Trude Mcburney M.D.   On: 07/19/2016 11:18   Mm Diag Breast Tomo Uni Left  Result Date: 07/19/2016 CLINICAL DATA:  76 year old asymptomatic female callback from screening mammogram for a mass in the left breast. Patient does have a personal history of right-sided breast cancer status post mastectomy in 2002. Patient does have a significant family history of breast cancer which includes her sister, maternal aunt, and a maternal second cousin. EXAM: 2D DIGITAL DIAGNOSTIC LEFT MAMMOGRAM WITH CAD AND ADJUNCT TOMO ULTRASOUND LEFT BREAST COMPARISON:  Previous exam(s). ACR Breast Density Category b: There are scattered areas of fibroglandular density. FINDINGS: Follow-up 2D and 3D full field mammographic images of the left breast demonstrates persistence of a well-circumscribed, isodense oval mass in the medial left breast at 9 o'clock measuring approximately 8 mm with no associated architectural distortion or microcalcifications. This mass is new when compared to previous images. Mammographic images were processed with CAD. On physical exam, no abnormality is palpated in the medial left breast  Targeted ultrasound is performed, showing a well-circumscribed, slightly irregular hypoechoic mass in the left breast at 9 o'clock, 4 cm from the nipple measuring 6 x 4 x 6 mm. There is no internal vascularity or posterior shadowing. This correlates well with the mass identified on mammography. Interrogation of the left axilla demonstrates no lymphadenopathy. IMPRESSION: Indeterminate mass in the left breast RECOMMENDATION: Recommend ultrasound-guided needle core biopsy of the mass in the left breast. We will assist the patient in arranging this procedure. I have discussed the findings and recommendations with the patient. Results were also provided in writing at the conclusion of the visit. If applicable, a reminder letter will be sent to the patient regarding the next appointment. BI-RADS CATEGORY  4: Suspicious. Electronically Signed   By: Trude Mcburney M.D.   On: 07/19/2016 11:18   Mm Clip Placement Left  Result Date: 07/22/2016 CLINICAL DATA:  Status post ultrasound-guided core biopsy of the a left breast mass. EXAM: DIAGNOSTIC LEFT MAMMOGRAM POST ULTRASOUND BIOPSY COMPARISON:  Previous exam(s). FINDINGS: Mammographic images were obtained following ultrasound guided biopsy of a mass in the lower inner quadrant of the left breast. Mammographic images show there is a ribbon shaped clip in the lower-inner quadrant of the left breast. IMPRESSION: Status post ultrasound-guided core biopsy of the left breast with pathology pending. Final Assessment: Post Procedure Mammograms for Marker Placement Electronically Signed   By: Lillia Mountain M.D.   On: 07/22/2016 13:45   [place] Lt Breast Bx W Loc Dev 1st Lesion Img Bx Spec US Guide  Addendum Date: 07/24/2016   ADDENDUM REPORT: 07/23/2016 13:50 ADDENDUM: Pathology revealed grade III invasive ductal carcinoma in the LEFT breast. This was found to be concordant by Dr. Lillia Mountain. Pathology results were discussed with the patient by telephone. The patient reported doing  well after the biopsy. Post biopsy instructions and care were reviewed and questions were answered. The patient was encouraged to call [facility] for any additional concerns. Surgical consultation has been arranged with Dr. Fanny [name] at [facility] on July 26, 2016. Pathology results reported by Susa Raring RN, BSN on 07/23/2016. Electronically Signed   By: Lillia Mountain M.D.   On: 07/23/2016 13:50   Result Date: 07/24/2016 CLINICAL DATA:  Status post ultrasound-guided core biopsy of left breast mass. EXAM: DIAGNOSTIC LEFT MAMMOGRAM POST ULTRASOUND BIOPSY COMPARISON:  Previous exam(s). FINDINGS: Mammographic images were  obtained following ultrasound guided biopsy of a mass in the lower-inner quadrant of the left breast. Mammographic images show there is a ribbon shaped clip in the lower-inner quadrant of the left breast in appropriate position. IMPRESSION: Status post ultrasound-guided core biopsy of the left breast with pathology pending. Final Assessment: Post Procedure Mammograms for Marker Placement Electronically Signed: By: Lillia Mountain M.D. On: 07/22/2016 14:27      ASSESSMENT: 76 y.o.  woman   (1) status post right lumpectomy and sentinel lymph node biopsy in September 2002 for multifocal breast carcinoma, triple negative.  Treated with CMF followed by radiation.   (2) Local recurrence in April 2004, status post right modified radical mastectomy with TRAM reconstruction for what proved to be a T1c N0, stage IA triple negative breast carcinoma.  Treated adjuvantly with paclitaxel and doxorubicin x4 in 2004.  Off treatment since August 2004 with no evidence of recurrence  (3) history of papillary thyroid cancer s/p thyroidectomy June 2007, s/p radioactive iodine July 2007  (4) status post left breast upper outer quadrant biopsy 08/21/2016 for a clinical T1b N0, stage IB invasive ductal carcinoma, grade 3, weekly estrogen receptor  positive, progesterone receptor and HER-2 negative, with an MIB-1 of 80%.  (5) left lumpectomy and sentinel lymph node sampling pending  (6) adjuvant chemotherapy to consist of carboplatin and gemcitabine given days 1 and 8 of each 21 day cycle 4 cycles  (7) adjuvant radiation to follow  (8) genetics testing pending.  PLAN: Deanna now has her third occurrence of breast cancer, this time on the left side. Of course she also had thyroid cancer 11 years ago. That and the fact that she has 3 relatives with breast cancer qualifies her for genetics testing which will be requested.  Her left-sided breast cancer is small and amenable to breast conserving surgery which is planned. She will eventually also benefit from adjuvant radiation. The real concern is regarding systemic therapy. She is not a candidate for anti-HER-2 treatment. The benefit of anti-estrogen therapy is likely to be minimal in this progesterone receptor negative very weakly estrogen receptor positive--essentially triple negative--cancer.  Accordingly the real choices between no systemic therapy and chemotherapy. Given the fact that this is a grade 3 rapidly growing tumor, although very small, I think chemotherapy would be wise.  The next complication is that she has already had cyclophosphamide doxorubicin and paclitaxel, which are the main agents used in this setting. It would not be inconceivable to repeat CMF for example. I think a better plan would be to use agents she has not had in the past specifically carboplatin and gemcitabine. She could receive these days 1 and 8 of each 21 day cycle for 4 cycles. I believe that would be adequate. I also believe she would tolerate that much better than alternatives such as cyclophosphamide and Taxotere.  We discussed this at length today. She understands her chance of losing her hair is high. She may also expect some nausea, fatigue, and low counts. On the other hand neuropathy and cardiac  problems are not expected to be an issue with this combination.  She is agreeable to treatment. She is already scheduled to see Dr. Dalbert Batman tomorrow and in addition to the planned surgery he will place a port. She will then return to see me and we will plan the start of treatment after which she will receive radiation.  He has a good understanding of this plan. She agrees to it. She knows the goal of treatment  in her case is cure. She will call with any problems that may before her next visit.  Emillee Talsma C    08/07/2016

## 2016-08-08 NOTE — Progress Notes (Signed)
START OFF PATHWAY REGIMEN - Breast   [id number]:Gemcitabine + Carboplatin (1000/2) q21 Days:   A cycle is every 21 days:     Gemcitabine      Carboplatin   **Always confirm dose/schedule in your pharmacy ordering system**    Patient Characteristics: Postoperative without Neoadjuvant Therapy (Pathologic Staging), Invasive Disease, Adjuvant Therapy, Node Negative, HER2 Negative/Unknown/Equivocal, ER Negative Therapeutic Status: Postoperative without Neoadjuvant Therapy (Pathologic Staging) AJCC Grade: G3 AJCC N Category: pN0 AJCC M Category: cM0 ER Status: Negative (-) AJCC 8 Stage Grouping: IB HER2 Status: Negative (-) Oncotype Dx Recurrence Score: Not Appropriate AJCC T Category: pT1c PR Status: Negative (-) Intent of Therapy: Curative Intent, Discussed with Patient

## 2016-08-09 ENCOUNTER — Telehealth: Payer: Self-pay | Admitting: Oncology

## 2016-08-09 NOTE — Telephone Encounter (Signed)
lvm to inform 7/24 appts at 2 pm per LOS

## 2016-08-12 ENCOUNTER — Other Ambulatory Visit: Payer: Self-pay | Admitting: General Surgery

## 2016-08-12 DIAGNOSIS — Z9011 Acquired absence of right breast and nipple: Secondary | ICD-10-CM | POA: Diagnosis not present

## 2016-08-12 DIAGNOSIS — F419 Anxiety disorder, unspecified: Secondary | ICD-10-CM | POA: Diagnosis not present

## 2016-08-12 DIAGNOSIS — C50412 Malignant neoplasm of upper-outer quadrant of left female breast: Secondary | ICD-10-CM | POA: Diagnosis not present

## 2016-08-12 DIAGNOSIS — Z85850 Personal history of malignant neoplasm of thyroid: Secondary | ICD-10-CM | POA: Diagnosis not present

## 2016-08-12 DIAGNOSIS — I10 Essential (primary) hypertension: Secondary | ICD-10-CM | POA: Diagnosis not present

## 2016-08-12 DIAGNOSIS — Z853 Personal history of malignant neoplasm of breast: Secondary | ICD-10-CM | POA: Diagnosis not present

## 2016-08-12 DIAGNOSIS — C50112 Malignant neoplasm of central portion of left female breast: Secondary | ICD-10-CM

## 2016-08-12 DIAGNOSIS — F329 Major depressive disorder, single episode, unspecified: Secondary | ICD-10-CM | POA: Diagnosis not present

## 2016-08-19 ENCOUNTER — Other Ambulatory Visit: Payer: Self-pay | Admitting: Oncology

## 2016-08-22 ENCOUNTER — Other Ambulatory Visit: Payer: Self-pay | Admitting: General Surgery

## 2016-08-22 DIAGNOSIS — C50112 Malignant neoplasm of central portion of left female breast: Secondary | ICD-10-CM

## 2016-09-03 ENCOUNTER — Telehealth: Payer: Self-pay | Admitting: Genetics

## 2016-09-03 ENCOUNTER — Other Ambulatory Visit: Payer: Medicare Other

## 2016-09-03 ENCOUNTER — Ambulatory Visit: Payer: Medicare Other | Admitting: Oncology

## 2016-09-03 ENCOUNTER — Encounter: Payer: Medicare Other | Admitting: Genetics

## 2016-09-03 NOTE — Telephone Encounter (Signed)
Patient called and cancelled her appointments today.  She said she had a terrible stomach bug.  She was not aware of the appointment for [facility]

## 2016-09-05 ENCOUNTER — Telehealth: Payer: Self-pay | Admitting: *Deleted

## 2016-09-05 ENCOUNTER — Encounter (HOSPITAL_BASED_OUTPATIENT_CLINIC_OR_DEPARTMENT_OTHER): Payer: Self-pay | Admitting: *Deleted

## 2016-09-05 NOTE — Telephone Encounter (Signed)
This RN returned call to pt per appointments scheduled for next week.  [name] [name] states she will be having surgery on Wed 8/1 with Dr Dalbert Batman for a lumpectomy and port placement " and he told me I could not get chemo for at least 2 weeks but I would like to start as soon as possible "  This RN discussed above with pt and need for appropriate healing for chemo to be initiated - informed her per data starting chemo within 6 weeks of her lumpectomy is recommended.  Appointments for 7/30 cancelled.  Per MD review appointment for infusion will be cancelled on 8/6 but MD appointment should be maintained.

## 2016-09-05 NOTE — H&P (Signed)
[name] [name] Location: [facility] Patient #: [id number] DOB: [birth year] Married / Language: English / Race: White Female        History of Present Illness       The patient is a 76 year old female who presents with breast cancer. this is a 76 year old female who returns for a second preoperative visit to schedule definitive surgery for her left breast cancer. She has seen Dr. Jana Hakim who advises Port-A-Cath insertion. She is comfortable with this but still anxious. Her PCP is Dr. Dorann Lodge cough.      In 2002 she underwent right breast lumpectomy and radiation therapy and chemotherapy. PRY was involved. In 2004 she developed a recurrence in the right breast underwent right mastectomy, TRAM flap reconstruction and chemotherapy. She had a peripheral port in her left arm. Recent imaging studies show a 6 mm mass in the left breast at the 9 o'clock position 4 cm from the nipple. Axilla is negative by ultrasound. Biopsy of the left breast shows grade 3 invasive ductal carcinoma, ER 5% but essentially triple negative breast cancer. She has seen Dr. Jana Hakim. Her performance status is excellent. He requests Port-A-Cath and adjuvant chemotherapy. Decisions about radiation therapy will come later.       Comorbidities include the right breast cancer with surgery in 2002 and recurrence 2004. Left breast reduction 2005. Thyroidectomy 2006 for thyroid cancer. Anxiety and depression. Hypertension. Good health and performance status.      Family history reveals mother died age 46 without swallowing problems and aspiration. Probably died of myocardial infarction at age 33. No breast or ovarian cancer in the family. Sister had lymphoma.      Social history reveals she is married. They're independent living in [place]. 3 grown children. Denies alcohol or tobacco.      We had a very long discussion. She is ready  to schedule surgery and wants it done urgently. She will be  scheduled for injection of blue dye left breast, left breast lumpectomy with RSL, left axillary SLN biopsy, insertion of Port-A-Cath with ultrasound. I discussed the indications, details, techniques, and numerous risk of the surgery with her. She is aware of the risk of bleeding, infection, reoperation for positive margins or positive nodes, cosmetic deformity, nerve damage with chronic pain or numbness, arm swelling, Port-A-Cath technical problems, requiring revision, pneumothorax requiring hospitalization. She understands all these issues. All of her questions were answered. She agrees with this plan.     Allergies  Sulfa Antibiotics  Rash, Itching. Pneumococcal Vaccines  Allergies Reconciled   Medication History  ALPRAZolam (1MG  Tablet, Oral) Active. DULoxetine HCl (60MG  Capsule DR Part, Oral) Active. Levothyroxine Sodium (112MCG Tablet, Oral) Active. Losartan Potassium (100MG  Tablet, Oral) Active. Rosuvastatin Calcium (20MG  Tablet, Oral) Active. Triamcinolone Acetonide (0.5% Ointment, External) Active. Medications Reconciled  Vitals  Weight: 145.4 lb Height: 64in Body Surface Area: 1.71 m Body Mass Index: 24.96 kg/m  Temp.: 97.28F  Pulse: 94 (Regular)  BP: 130/90 (Sitting, Left Arm, Standard)    Physical Exam  General Mental Status-Alert. General Appearance-Not in acute distress. Build & Nutrition-Well nourished. Posture-Normal posture. Gait-Normal.  Head and Neck Head-normocephalic, atraumatic with no lesions or palpable masses. Trachea-midline. Thyroid Gland Characteristics - normal size and consistency and no palpable nodules. Note: Well-healed thyroidectomy scar. No adenopathy or mass. Voice normal.   Chest and Lung Exam Chest and lung exam reveals -on auscultation, normal breath sounds, no adventitious sounds and normal vocal resonance.  Breast Note: Right breast has been surgically removed  and she has a TRAM  reconstruction. No palpable mass skin changes or axillary adenopathy otherwise. Left breast reveals reduction mammoplasty scars. Bruise and thickening no other skin changes. No axillary mass. Consistent with recent biopsy   Cardiovascular Cardiovascular examination reveals -normal heart sounds, regular rate and rhythm with no murmurs and femoral artery auscultation bilaterally reveals normal pulses, no bruits, no thrills.  Abdomen Inspection Inspection of the abdomen reveals - No Hernias. Palpation/Percussion Palpation and Percussion of the abdomen reveal - Soft, Non Tender, No Rigidity (guarding), No hepatosplenomegaly and No Palpable abdominal masses.  Neurologic Neurologic evaluation reveals -alert and oriented x 3 with no impairment of recent or remote memory, normal attention span and ability to concentrate, normal sensation and normal coordination.  Neuropsychiatric Note: Somewhat anxious but very appropriate.   Musculoskeletal Normal Exam - Bilateral-Upper Extremity Strength Normal and Lower Extremity Strength Normal.    Assessment & Plan  PRIMARY CANCER OF UPPER OUTER QUADRANT OF LEFT FEMALE BREAST (C50.412)   You have a small, 6 mm cancer in the left breast at the 9 o'clock position Your lymph nodes looked normal by physical exam and x-ray There is no evidence of cancer elsewhere This is a fairly high-grade cancer He has seen Dr. Jana Hakim and we have decided on our treatment plan  We will schedule you for left breast lumpectomy with radioactive seed localization, left axillary deep Sentinel lymph node biopsy, insertion of Port-A-Cath We have discussed the indications, techniques, and risks of these surgeries in detail We will schedule the surgery at the first possible opportunity  HISTORY OF RIGHT BREAST CANCER (Z85.3) HISTORY OF MASTECTOMY, RIGHT (Z90.11) HISTORY OF THYROID CANCER (Z85.850) ANXIETY AND DEPRESSION (F41.9) HYPERTENSION, ESSENTIAL  (I10)    [name] [name] M. Dalbert Batman, M.D., [facility] General and Minimally invasive Surgery Breast and Colorectal Surgery Office:   [phone] Pager:   [phone]

## 2016-09-09 ENCOUNTER — Ambulatory Visit: Payer: Medicare Other | Admitting: Adult Health

## 2016-09-09 ENCOUNTER — Other Ambulatory Visit: Payer: Medicare Other

## 2016-09-09 ENCOUNTER — Ambulatory Visit: Payer: Medicare Other

## 2016-09-10 ENCOUNTER — Ambulatory Visit
Admission: RE | Admit: 2016-09-10 | Discharge: 2016-09-10 | Disposition: A | Discharge status: Home / Self Care | Payer: Medicare Other | Source: Ambulatory Visit | Attending: General Surgery | Admitting: General Surgery

## 2016-09-10 ENCOUNTER — Encounter (HOSPITAL_BASED_OUTPATIENT_CLINIC_OR_DEPARTMENT_OTHER)
Admission: RE | Admit: 2016-09-10 | Discharge: 2016-09-10 | Disposition: A | Discharge status: Home / Self Care | Payer: Medicare Other | Source: Ambulatory Visit | Attending: General Surgery | Admitting: General Surgery

## 2016-09-10 DIAGNOSIS — Z9011 Acquired absence of right breast and nipple: Secondary | ICD-10-CM | POA: Diagnosis not present

## 2016-09-10 DIAGNOSIS — Z170 Estrogen receptor positive status [ER+]: Secondary | ICD-10-CM | POA: Diagnosis not present

## 2016-09-10 DIAGNOSIS — I10 Essential (primary) hypertension: Secondary | ICD-10-CM | POA: Diagnosis not present

## 2016-09-10 DIAGNOSIS — Z882 Allergy status to sulfonamides status: Secondary | ICD-10-CM | POA: Diagnosis not present

## 2016-09-10 DIAGNOSIS — F329 Major depressive disorder, single episode, unspecified: Secondary | ICD-10-CM | POA: Diagnosis not present

## 2016-09-10 DIAGNOSIS — F419 Anxiety disorder, unspecified: Secondary | ICD-10-CM | POA: Diagnosis not present

## 2016-09-10 DIAGNOSIS — C50112 Malignant neoplasm of central portion of left female breast: Secondary | ICD-10-CM

## 2016-09-10 DIAGNOSIS — C50912 Malignant neoplasm of unspecified site of left female breast: Secondary | ICD-10-CM | POA: Diagnosis not present

## 2016-09-10 DIAGNOSIS — E039 Hypothyroidism, unspecified: Secondary | ICD-10-CM | POA: Diagnosis not present

## 2016-09-10 DIAGNOSIS — Z887 Allergy status to serum and vaccine status: Secondary | ICD-10-CM | POA: Diagnosis not present

## 2016-09-10 DIAGNOSIS — C50412 Malignant neoplasm of upper-outer quadrant of left female breast: Secondary | ICD-10-CM | POA: Diagnosis not present

## 2016-09-10 DIAGNOSIS — Z79899 Other long term (current) drug therapy: Secondary | ICD-10-CM | POA: Diagnosis not present

## 2016-09-10 NOTE — Pre-Procedure Instructions (Signed)
Instructed to drink Boost breeze by 0430. EKG reviewed by Dr Gifford Shave and ok to proceed

## 2016-09-11 ENCOUNTER — Ambulatory Visit (HOSPITAL_COMMUNITY)
Admission: RE | Admit: 2016-09-11 | Discharge: 2016-09-11 | Disposition: A | Discharge status: Home / Self Care | Payer: Medicare Other | Source: Ambulatory Visit | Attending: General Surgery | Admitting: General Surgery

## 2016-09-11 ENCOUNTER — Ambulatory Visit (HOSPITAL_COMMUNITY): Payer: Medicare Other

## 2016-09-11 ENCOUNTER — Ambulatory Visit (HOSPITAL_BASED_OUTPATIENT_CLINIC_OR_DEPARTMENT_OTHER): Payer: Medicare Other | Admitting: Anesthesiology

## 2016-09-11 ENCOUNTER — Ambulatory Visit
Admission: RE | Admit: 2016-09-11 | Discharge: 2016-09-11 | Disposition: A | Discharge status: Home / Self Care | Payer: Medicare Other | Source: Ambulatory Visit | Attending: General Surgery | Admitting: General Surgery

## 2016-09-11 ENCOUNTER — Ambulatory Visit (HOSPITAL_BASED_OUTPATIENT_CLINIC_OR_DEPARTMENT_OTHER)
Admission: RE | Admit: 2016-09-11 | Discharge: 2016-09-11 | Disposition: A | Discharge status: Home / Self Care | Payer: Medicare Other | Source: Ambulatory Visit | Attending: General Surgery | Admitting: General Surgery

## 2016-09-11 ENCOUNTER — Encounter (HOSPITAL_BASED_OUTPATIENT_CLINIC_OR_DEPARTMENT_OTHER): Payer: Self-pay | Admitting: *Deleted

## 2016-09-11 ENCOUNTER — Encounter (HOSPITAL_BASED_OUTPATIENT_CLINIC_OR_DEPARTMENT_OTHER)
Admission: RE | Disposition: A | Discharge status: Home / Self Care | Payer: Self-pay | Source: Ambulatory Visit | Attending: General Surgery

## 2016-09-11 DIAGNOSIS — I700 Atherosclerosis of aorta: Secondary | ICD-10-CM | POA: Diagnosis not present

## 2016-09-11 DIAGNOSIS — F419 Anxiety disorder, unspecified: Secondary | ICD-10-CM | POA: Insufficient documentation

## 2016-09-11 DIAGNOSIS — C50412 Malignant neoplasm of upper-outer quadrant of left female breast: Secondary | ICD-10-CM | POA: Insufficient documentation

## 2016-09-11 DIAGNOSIS — C50912 Malignant neoplasm of unspecified site of left female breast: Secondary | ICD-10-CM | POA: Diagnosis not present

## 2016-09-11 DIAGNOSIS — C50112 Malignant neoplasm of central portion of left female breast: Secondary | ICD-10-CM

## 2016-09-11 DIAGNOSIS — C50919 Malignant neoplasm of unspecified site of unspecified female breast: Secondary | ICD-10-CM

## 2016-09-11 DIAGNOSIS — Z9011 Acquired absence of right breast and nipple: Secondary | ICD-10-CM | POA: Insufficient documentation

## 2016-09-11 DIAGNOSIS — I10 Essential (primary) hypertension: Secondary | ICD-10-CM | POA: Diagnosis not present

## 2016-09-11 DIAGNOSIS — Z887 Allergy status to serum and vaccine status: Secondary | ICD-10-CM | POA: Insufficient documentation

## 2016-09-11 DIAGNOSIS — D0512 Intraductal carcinoma in situ of left breast: Secondary | ICD-10-CM | POA: Diagnosis not present

## 2016-09-11 DIAGNOSIS — Z79899 Other long term (current) drug therapy: Secondary | ICD-10-CM | POA: Diagnosis not present

## 2016-09-11 DIAGNOSIS — Z882 Allergy status to sulfonamides status: Secondary | ICD-10-CM | POA: Insufficient documentation

## 2016-09-11 DIAGNOSIS — F329 Major depressive disorder, single episode, unspecified: Secondary | ICD-10-CM | POA: Insufficient documentation

## 2016-09-11 DIAGNOSIS — E039 Hypothyroidism, unspecified: Secondary | ICD-10-CM | POA: Diagnosis not present

## 2016-09-11 DIAGNOSIS — Z170 Estrogen receptor positive status [ER+]: Secondary | ICD-10-CM | POA: Insufficient documentation

## 2016-09-11 DIAGNOSIS — Z95828 Presence of other vascular implants and grafts: Secondary | ICD-10-CM

## 2016-09-11 DIAGNOSIS — G8918 Other acute postprocedural pain: Secondary | ICD-10-CM | POA: Diagnosis not present

## 2016-09-11 DIAGNOSIS — R928 Other abnormal and inconclusive findings on diagnostic imaging of breast: Secondary | ICD-10-CM | POA: Diagnosis not present

## 2016-09-11 HISTORY — PX: PORTACATH PLACEMENT: SHX2246

## 2016-09-11 HISTORY — PX: BREAST LUMPECTOMY WITH RADIOACTIVE SEED AND SENTINEL LYMPH NODE BIOPSY: SHX6550

## 2016-09-11 SURGERY — BREAST LUMPECTOMY WITH RADIOACTIVE SEED AND SENTINEL LYMPH NODE BIOPSY
Anesthesia: General | Site: Neck

## 2016-09-11 MED ORDER — EPHEDRINE SULFATE-NACL 50-0.9 MG/10ML-% IV SOSY
PREFILLED_SYRINGE | INTRAVENOUS | Status: DC | PRN
  Administered 2016-09-11: 10 mg via INTRAVENOUS
  Administered 2016-09-11 (×2): 15 mg via INTRAVENOUS
  Administered 2016-09-11: 10 mg via INTRAVENOUS

## 2016-09-11 MED ORDER — DEXAMETHASONE SODIUM PHOSPHATE 10 MG/ML IJ SOLN
INTRAMUSCULAR | Status: AC
  Filled 2016-09-11: qty 1

## 2016-09-11 MED ORDER — LIDOCAINE 2% (20 MG/ML) 5 ML SYRINGE
INTRAMUSCULAR | Status: AC
  Filled 2016-09-11: qty 5

## 2016-09-11 MED ORDER — MIDAZOLAM HCL 2 MG/2ML IJ SOLN
INTRAMUSCULAR | Status: AC
  Filled 2016-09-11: qty 2

## 2016-09-11 MED ORDER — ACETAMINOPHEN 500 MG PO TABS
1000.0000 mg | ORAL_TABLET | ORAL | Status: AC
  Administered 2016-09-11: 1000 mg via ORAL

## 2016-09-11 MED ORDER — PHENYLEPHRINE 40 MCG/ML (10ML) SYRINGE FOR IV PUSH (FOR BLOOD PRESSURE SUPPORT)
PREFILLED_SYRINGE | INTRAVENOUS | Status: AC
  Filled 2016-09-11: qty 10

## 2016-09-11 MED ORDER — DEXAMETHASONE SODIUM PHOSPHATE 4 MG/ML IJ SOLN
INTRAMUSCULAR | Status: DC | PRN
  Administered 2016-09-11: 10 mg via INTRAVENOUS

## 2016-09-11 MED ORDER — CEFAZOLIN SODIUM-DEXTROSE 2-4 GM/100ML-% IV SOLN
INTRAVENOUS | Status: AC
  Filled 2016-09-11: qty 100

## 2016-09-11 MED ORDER — TECHNETIUM TC 99M SULFUR COLLOID FILTERED
1.0000 | Freq: Once | INTRAVENOUS | Status: AC | PRN
  Administered 2016-09-11: 1 via INTRADERMAL

## 2016-09-11 MED ORDER — PHENYLEPHRINE 40 MCG/ML (10ML) SYRINGE FOR IV PUSH (FOR BLOOD PRESSURE SUPPORT)
PREFILLED_SYRINGE | INTRAVENOUS | Status: DC | PRN
  Administered 2016-09-11: 80 ug via INTRAVENOUS
  Administered 2016-09-11 (×2): 120 ug via INTRAVENOUS
  Administered 2016-09-11: 80 ug via INTRAVENOUS

## 2016-09-11 MED ORDER — LACTATED RINGERS IV SOLN
INTRAVENOUS | Status: DC
  Administered 2016-09-11 (×2): via INTRAVENOUS

## 2016-09-11 MED ORDER — SODIUM CHLORIDE 0.9 % IJ SOLN
INTRAMUSCULAR | Status: AC
  Filled 2016-09-11: qty 10

## 2016-09-11 MED ORDER — ACETAMINOPHEN 500 MG PO TABS
ORAL_TABLET | ORAL | Status: AC
  Filled 2016-09-11: qty 2

## 2016-09-11 MED ORDER — CEFAZOLIN SODIUM-DEXTROSE 2-4 GM/100ML-% IV SOLN
2.0000 g | INTRAVENOUS | Status: AC
  Administered 2016-09-11: 2 g via INTRAVENOUS

## 2016-09-11 MED ORDER — SODIUM CHLORIDE 0.9% FLUSH: 3.0000 mL | Freq: Two times a day (BID) | INTRAVENOUS | Status: DC

## 2016-09-11 MED ORDER — FENTANYL CITRATE (PF) 100 MCG/2ML IJ SOLN
INTRAMUSCULAR | Status: AC
  Filled 2016-09-11: qty 2

## 2016-09-11 MED ORDER — CHLORHEXIDINE GLUCONATE CLOTH 2 % EX PADS: 6.0000 | MEDICATED_PAD | Freq: Once | CUTANEOUS | Status: DC

## 2016-09-11 MED ORDER — BUPIVACAINE-EPINEPHRINE (PF) 0.5% -1:200000 IJ SOLN
INTRAMUSCULAR | Status: AC
  Filled 2016-09-11: qty 30

## 2016-09-11 MED ORDER — PHENYLEPHRINE HCL 10 MG/ML IJ SOLN
INTRAMUSCULAR | Status: AC
  Filled 2016-09-11: qty 1

## 2016-09-11 MED ORDER — SODIUM CHLORIDE 0.9% FLUSH: 3.0000 mL | INTRAVENOUS | Status: DC | PRN

## 2016-09-11 MED ORDER — GABAPENTIN 300 MG PO CAPS
ORAL_CAPSULE | ORAL | Status: AC
  Filled 2016-09-11: qty 1

## 2016-09-11 MED ORDER — MIDAZOLAM HCL 2 MG/2ML IJ SOLN
1.0000 mg | INTRAMUSCULAR | Status: DC | PRN
Start: 2016-09-11 — End: 2016-09-11
  Administered 2016-09-11: 2 mg via INTRAVENOUS

## 2016-09-11 MED ORDER — EPHEDRINE 5 MG/ML INJ
INTRAVENOUS | Status: AC
  Filled 2016-09-11: qty 10

## 2016-09-11 MED ORDER — SODIUM CHLORIDE 0.9 % IJ SOLN
INTRAVENOUS | Status: DC | PRN
  Administered 2016-09-11: 5 mL via INTRAMUSCULAR

## 2016-09-11 MED ORDER — HEPARIN (PORCINE) IN NACL 2-0.9 UNIT/ML-% IJ SOLN
INTRAMUSCULAR | Status: AC
  Filled 2016-09-11: qty 500

## 2016-09-11 MED ORDER — PROPOFOL 10 MG/ML IV BOLUS
INTRAVENOUS | Status: DC | PRN
  Administered 2016-09-11: 130 mg via INTRAVENOUS

## 2016-09-11 MED ORDER — 0.9 % SODIUM CHLORIDE (POUR BTL) OPTIME
TOPICAL | Status: DC | PRN
  Administered 2016-09-11: 1000 mL

## 2016-09-11 MED ORDER — ARTIFICIAL TEARS OPHTHALMIC OINT
TOPICAL_OINTMENT | OPHTHALMIC | Status: AC
  Filled 2016-09-11: qty 3.5

## 2016-09-11 MED ORDER — GABAPENTIN 300 MG PO CAPS
300.0000 mg | ORAL_CAPSULE | ORAL | Status: AC
  Administered 2016-09-11: 300 mg via ORAL

## 2016-09-11 MED ORDER — ONDANSETRON HCL 4 MG/2ML IJ SOLN
INTRAMUSCULAR | Status: AC
  Filled 2016-09-11: qty 2

## 2016-09-11 MED ORDER — CELECOXIB 200 MG PO CAPS
ORAL_CAPSULE | ORAL | Status: AC
  Filled 2016-09-11: qty 1

## 2016-09-11 MED ORDER — BUPIVACAINE-EPINEPHRINE (PF) 0.5% -1:200000 IJ SOLN
INTRAMUSCULAR | Status: DC | PRN
  Administered 2016-09-11: 30 mL

## 2016-09-11 MED ORDER — METHYLENE BLUE 0.5 % INJ SOLN
INTRAVENOUS | Status: AC
  Filled 2016-09-11: qty 10

## 2016-09-11 MED ORDER — SCOPOLAMINE 1 MG/3DAYS TD PT72: 1.0000 | MEDICATED_PATCH | Freq: Once | TRANSDERMAL | Status: DC | PRN

## 2016-09-11 MED ORDER — FENTANYL CITRATE (PF) 100 MCG/2ML IJ SOLN
50.0000 ug | INTRAMUSCULAR | Status: AC | PRN
  Administered 2016-09-11: 25 ug via INTRAVENOUS
  Administered 2016-09-11 (×3): 50 ug via INTRAVENOUS
  Administered 2016-09-11: 25 ug via INTRAVENOUS

## 2016-09-11 MED ORDER — LIDOCAINE 2% (20 MG/ML) 5 ML SYRINGE
INTRAMUSCULAR | Status: DC | PRN
  Administered 2016-09-11: 60 mg via INTRAVENOUS
  Administered 2016-09-11: 40 mg via INTRAVENOUS

## 2016-09-11 MED ORDER — FENTANYL CITRATE (PF) 100 MCG/2ML IJ SOLN: 25.0000 ug | INTRAMUSCULAR | Status: DC | PRN

## 2016-09-11 MED ORDER — PHENYLEPHRINE HCL 10 MG/ML IJ SOLN
INTRAVENOUS | Status: DC | PRN
  Administered 2016-09-11: 50 ug/min via INTRAVENOUS

## 2016-09-11 MED ORDER — HYDROCODONE-ACETAMINOPHEN 5-325 MG PO TABS: 1.0000 | ORAL_TABLET | Freq: Four times a day (QID) | ORAL | 0 refills | Status: DC | PRN

## 2016-09-11 MED ORDER — ACETAMINOPHEN 325 MG PO TABS: 650.0000 mg | ORAL_TABLET | ORAL | Status: DC | PRN

## 2016-09-11 MED ORDER — ACETAMINOPHEN 650 MG RE SUPP: 650.0000 mg | RECTAL | Status: DC | PRN

## 2016-09-11 MED ORDER — OXYCODONE HCL 5 MG PO TABS: 5.0000 mg | ORAL_TABLET | ORAL | Status: DC | PRN

## 2016-09-11 MED ORDER — CELECOXIB 400 MG PO CAPS
400.0000 mg | ORAL_CAPSULE | ORAL | Status: AC
  Administered 2016-09-11: 200 mg via ORAL

## 2016-09-11 MED ORDER — HEPARIN SOD (PORK) LOCK FLUSH 100 UNIT/ML IV SOLN
INTRAVENOUS | Status: AC
  Filled 2016-09-11: qty 5

## 2016-09-11 MED ORDER — BUPIVACAINE-EPINEPHRINE (PF) 0.5% -1:200000 IJ SOLN
INTRAMUSCULAR | Status: DC | PRN
Start: 2016-09-11 — End: 2016-09-11
  Administered 2016-09-11: 15 mL

## 2016-09-11 MED ORDER — PROPOFOL 10 MG/ML IV BOLUS
INTRAVENOUS | Status: AC
  Filled 2016-09-11: qty 40

## 2016-09-11 MED ORDER — SODIUM CHLORIDE 0.9 % IV SOLN: 250.0000 mL | INTRAVENOUS | Status: DC | PRN

## 2016-09-11 SURGICAL SUPPLY — 79 items
ADH SKN CLS APL DERMABOND .7 (GAUZE/BANDAGES/DRESSINGS) ×4
APL SKNCLS STERI-STRIP NONHPOA (GAUZE/BANDAGES/DRESSINGS)
APPLIER CLIP 9.375 MED OPEN (MISCELLANEOUS) ×3
APR CLP MED 9.3 20 MLT OPN (MISCELLANEOUS) ×2
BAG DECANTER FOR FLEXI CONT (MISCELLANEOUS) ×3 IMPLANT
BENZOIN TINCTURE PRP APPL 2/3 (GAUZE/BANDAGES/DRESSINGS) IMPLANT
BINDER BREAST LRG (GAUZE/BANDAGES/DRESSINGS) ×1 IMPLANT
BINDER BREAST MEDIUM (GAUZE/BANDAGES/DRESSINGS) IMPLANT
BINDER BREAST XLRG (GAUZE/BANDAGES/DRESSINGS) IMPLANT
BINDER BREAST XXLRG (GAUZE/BANDAGES/DRESSINGS) IMPLANT
BLADE HEX COATED 2.75 (ELECTRODE) ×3 IMPLANT
BLADE SURG 10 STRL SS (BLADE) IMPLANT
BLADE SURG 15 STRL LF DISP TIS (BLADE) ×2 IMPLANT
BLADE SURG 15 STRL SS (BLADE) ×3
CANISTER SUC SOCK COL 7IN (MISCELLANEOUS) IMPLANT
CANISTER SUCT 1200ML W/VALVE (MISCELLANEOUS) ×3 IMPLANT
CHLORAPREP W/TINT 26ML (MISCELLANEOUS) ×3 IMPLANT
CLIP APPLIE 9.375 MED OPEN (MISCELLANEOUS) ×2 IMPLANT
COVER BACK TABLE 60X90IN (DRAPES) ×3 IMPLANT
COVER MAYO STAND STRL (DRAPES) ×3 IMPLANT
COVER PROBE 5X48 (MISCELLANEOUS) ×3
COVER PROBE W GEL 5X96 (DRAPES) ×3 IMPLANT
DECANTER SPIKE VIAL GLASS SM (MISCELLANEOUS) IMPLANT
DERMABOND ADVANCED (GAUZE/BANDAGES/DRESSINGS) ×2
DERMABOND ADVANCED .7 DNX12 (GAUZE/BANDAGES/DRESSINGS) ×2 IMPLANT
DEVICE DUBIN W/COMP PLATE 8390 (MISCELLANEOUS) ×3 IMPLANT
DRAPE C-ARM 42X72 X-RAY (DRAPES) ×3 IMPLANT
DRAPE LAPAROSCOPIC ABDOMINAL (DRAPES) ×3 IMPLANT
DRAPE UTILITY XL STRL (DRAPES) ×3 IMPLANT
DRSG PAD ABDOMINAL 8X10 ST (GAUZE/BANDAGES/DRESSINGS) ×1 IMPLANT
DRSG TEGADERM 2-3/8X2-3/4 SM (GAUZE/BANDAGES/DRESSINGS) IMPLANT
DRSG TEGADERM 4X10 (GAUZE/BANDAGES/DRESSINGS) IMPLANT
DRSG TEGADERM 4X4.75 (GAUZE/BANDAGES/DRESSINGS) IMPLANT
ELECT REM PT RETURN 9FT ADLT (ELECTROSURGICAL) ×3
ELECTRODE REM PT RTRN 9FT ADLT (ELECTROSURGICAL) ×2 IMPLANT
GAUZE SPONGE 4X4 12PLY STRL (GAUZE/BANDAGES/DRESSINGS) ×1 IMPLANT
GAUZE SPONGE 4X4 12PLY STRL LF (GAUZE/BANDAGES/DRESSINGS) IMPLANT
GLOVE EUDERMIC 7 POWDERFREE (GLOVE) ×6 IMPLANT
GOWN STRL REUS W/ TWL LRG LVL3 (GOWN DISPOSABLE) ×4 IMPLANT
GOWN STRL REUS W/ TWL XL LVL3 (GOWN DISPOSABLE) ×2 IMPLANT
GOWN STRL REUS W/TWL LRG LVL3 (GOWN DISPOSABLE) ×6
GOWN STRL REUS W/TWL XL LVL3 (GOWN DISPOSABLE) ×3
ILLUMINATOR WAVEGUIDE N/F (MISCELLANEOUS) IMPLANT
IV CATH PLACEMENT UNIT 16 GA (IV SOLUTION) IMPLANT
IV KIT MINILOC 20X1 SAFETY (NEEDLE) IMPLANT
KIT CVR 48X5XPRB PLUP LF (MISCELLANEOUS) ×2 IMPLANT
KIT MARKER MARGIN INK (KITS) ×3 IMPLANT
LIGHT WAVEGUIDE WIDE FLAT (MISCELLANEOUS) IMPLANT
NDL BLUNT 17GA (NEEDLE) IMPLANT
NDL HYPO 25X1 1.5 SAFETY (NEEDLE) ×4 IMPLANT
NDL SAFETY ECLIPSE 18X1.5 (NEEDLE) ×2 IMPLANT
NEEDLE BLUNT 17GA (NEEDLE) IMPLANT
NEEDLE HYPO 18GX1.5 SHARP (NEEDLE) ×3
NEEDLE HYPO 22GX1.5 SAFETY (NEEDLE) ×3 IMPLANT
NEEDLE HYPO 25X1 1.5 SAFETY (NEEDLE) ×6 IMPLANT
NS IRRIG 1000ML POUR BTL (IV SOLUTION) ×3 IMPLANT
PACK BASIN DAY SURGERY FS (CUSTOM PROCEDURE TRAY) ×3 IMPLANT
PENCIL BUTTON HOLSTER BLD 10FT (ELECTRODE) ×3 IMPLANT
SET SHEATH INTRODUCER 10FR (MISCELLANEOUS) IMPLANT
SHEATH COOK PEEL AWAY SET 9F (SHEATH) IMPLANT
SHEET MEDIUM DRAPE 40X70 STRL (DRAPES) IMPLANT
SLEEVE SCD COMPRESS KNEE MED (MISCELLANEOUS) ×3 IMPLANT
SPONGE LAP 18X18 X RAY DECT (DISPOSABLE) IMPLANT
SPONGE LAP 4X18 X RAY DECT (DISPOSABLE) ×3 IMPLANT
STRIP CLOSURE SKIN 1/2X4 (GAUZE/BANDAGES/DRESSINGS) IMPLANT
SUT MNCRL AB 4-0 PS2 18 (SUTURE) ×3 IMPLANT
SUT PROLENE 2 0 CT2 30 (SUTURE) ×3 IMPLANT
SUT SILK 2 0 SH (SUTURE) ×3 IMPLANT
SUT VIC AB 2-0 CT1 27 (SUTURE)
SUT VIC AB 2-0 CT1 TAPERPNT 27 (SUTURE) IMPLANT
SUT VIC AB 3-0 SH 27 (SUTURE)
SUT VIC AB 3-0 SH 27X BRD (SUTURE) IMPLANT
SUT VICRYL 3-0 CR8 SH (SUTURE) ×3 IMPLANT
SYR 10ML LL (SYRINGE) ×6 IMPLANT
SYR 5ML LUER SLIP (SYRINGE) ×3 IMPLANT
TOWEL OR 17X24 6PK STRL BLUE (TOWEL DISPOSABLE) ×6 IMPLANT
TOWEL OR NON WOVEN STRL DISP B (DISPOSABLE) ×3 IMPLANT
TUBE CONNECTING 20X1/4 (TUBING) ×3 IMPLANT
YANKAUER SUCT BULB TIP NO VENT (SUCTIONS) ×3 IMPLANT

## 2016-09-11 NOTE — Progress Notes (Signed)
Assisted Dr. Oren Bracket with left, ultrasound guided, pectoralis block. Side rails up, monitors on throughout procedure. See vital signs in flow sheet. Tolerated Procedure well.

## 2016-09-11 NOTE — Anesthesia Postprocedure Evaluation (Signed)
Anesthesia Post Note  Patient: [name] [name]  Procedure(s) Performed: Procedure(s) (LRB): LEFT BREAST LUMPECTOMY WITH RADIOACTIVE SEED AND LEFT AXILLARY SENTINEL LYMPH NODE BIOPSY WITH BLUE DYE INJECTION (Left) ATTEMPTED INSERTION PORT-A-CATH WITH ULTRA SOUND GUIDANCE (N/A)     Patient location during evaluation: PACU Anesthesia Type: General and Regional Level of consciousness: awake and alert Pain management: pain level controlled Vital Signs Assessment: post-procedure vital signs reviewed and stable Respiratory status: spontaneous breathing, nonlabored ventilation and respiratory function stable Cardiovascular status: blood pressure returned to baseline and stable Postop Assessment: no signs of nausea or vomiting Anesthetic complications: no    Last Vitals:  Vitals:   09/11/16 1115 09/11/16 1130  BP: 120/77 115/87  Pulse: (!) 102 (!) 101  Resp: 13 17  Temp: (!) 36.3 C     Last Pain:  Vitals:   09/11/16 0709  TempSrc: Oral                 Nthony Lefferts,W. EDMOND

## 2016-09-11 NOTE — Op Note (Signed)
Patient Name:           [name] [name]   Date of Surgery:        09/11/2016  Pre op Diagnosis:   Invasive ductal carcinoma left breast, 9:00 position, triple negative breast cancer     Post op Diagnosis:    Same  Procedure:                 Attempted Port-A-Cath insertion (unsuccessful)                                      Use of fluoroscopy for guidance and positioning                                      Ultrasound-guided internal jugular venipuncture                                      Inject blue dye left breast                                      Left breast lumpectomy with radioactive seed localization                                      Left axillary deep sentinel lymph node biopsy  Surgeon:                     Edsel Petrin. Dalbert Batman, M.D., FACS  Assistant:                      OR staff   Indication for Assistant: N/A   Operative Indications:   . this is a 76 year old female who is brought to the operating room for definitive surgery for her left breast cancer. She has seen Dr. Jana Hakim who advises Port-A-Cath insertion. She is comfortable with this but still anxious. Her PCP is Dr. Laurian Brim.      In 2002 she underwent right breast lumpectomy and radiation therapy and chemotherapy. PRY was involved. In 2004 she developed a recurrence in the right breast underwent right mastectomy, TRAM flap reconstruction and chemotherapy. She had a peripheral port in her left arm.  She has had a thyroidectomy. Recent imaging studies show a 6 mm mass in the left breast at the 9 o'clock position 4 cm from the nipple. Axilla is negative by ultrasound. Biopsy of the left breast shows grade 3 invasive ductal carcinoma, ER 5% but essentially triple negative breast cancer. She has seen Dr. Jana Hakim. Her performance status is excellent. He requests Port-A-Cath and adjuvant chemotherapy. Decisions about radiation therapy will come later.       Comorbidities include the right breast cancer with  surgery in 2002 and recurrence 2004. Left breast reduction 2005. Thyroidectomy 2006 for thyroid cancer. Right arm Port-A-Cath in past.  Anxiety and depression. Hypertension. Good health and performance status.      Family history reveals mother died age 16 without swallowing problems and aspiration. Probably died of myocardial infarction at age 74. No breast or ovarian cancer in the family. Sister  had lymphoma.      We had a very long discussion.She will be scheduled for injection of blue dye left breast, left breast lumpectomy with RSL, left axillary SLN biopsy, insertion of Port-A-Cath with ultrasound. I discussed the indications, details, techniques, and numerous risk of the surgery with her.She agrees with this plan.  Operative Findings:       Technetium radionuclide was injected into the left breast in the holding area by the nuclear medicine technician.  Port-A-Cath insertion was unsuccessful.  I attempted right subclavian venipuncture twice and got reasonable blood return and the catheter threaded but it went across the midline into the left subclavian vein repeatedly and I could not pull it back and redirected into the superior vena cava.  Right internal jugular venipuncture was successful with blood return times two  but also threading this catheter went over to the left side and never went down to the vena cava.  I got arterial blood return from one of the venipuncture attempts in the right neck.  Pressure was held and there was no hematoma one hour later.  I chose to abandon further attempts for safety reasons.  Interventional radiology will be consulted       The well-healed scars from her right TRAM flap reconstruction in her left breast reduction were observed.  Her thyroidectomy scar was observed.     The lumpectomy was performed of the left breast through a transverse radially ordered incision at the 9:00 position.  The specimen mammogram looked very good with the marker clip and  this radioactive seed in the center of the specimen.  I found 3 sentinel lymph nodes   Procedure in Detail:          Following the induction of general LMA anesthesia a roll was placed behind the patient's shoulders and her arms tucked at her sides.  The neck and chest were prepped and draped in a sterile fashion.  Intravenous antibiotics were given.  0.5% Marcaine with epinephrine was used as a local infiltration anesthetic.  Surgical timeout was performed.      A right subclavian venipuncture was performed twice and a right internal jugular venipuncture was performed twice each time there was good blood return, but the wire would only threaded down and across the left side into the left subclavian vein and the anatomy was uncertain.  I chose to abandon Port-A-Cath insertion at this point.     The patient was then reprepped and redraped with her arms out at her sides.  The neck and left chest and axilla and chest wall were prepped and draped in a sterile fashion.  Using the neoprobe I  identified the radioactive seed in the left breast at the 9:00 position.  This was about 4 cm lateral to the areolar margin.  A transverse radial incision was made overlying the radioactive signal.  Lumpectomy was performed using the neoprobe and electrocautery.  Specimen was removed and marked with silk sutures and a 6 color ink kit to orient the pathologist.  The specimen mammogram looked very good as mentioned above.  The specimen was marked and sent to the lab.  Hemostasis was excellent.  The wound was irrigated.  5 metal clips were placed in the walls of the lumpectomy cavity according to our protocol.  Breast tissues were reapproximated with interrupted sutures of 3-0 Vicryl and the skin closed with a running subcuticular 4-0 Monocryl and Dermabond.     The neoprobe was reset to the technetium setting.  Transverse incision was made in the left axilla at the hairline.  Dissection was carried down through the clavipectoral  fascia.  I dissected out 3 obvious sentinel lymph nodes that were very hot and somewhat blue.  There was no more radioactivity after these 3 nodes were removed.  The wound was irrigated.  Hemostasis was excellent.  The clavipectoral fascia was closed with 3-0 Vicryl sutures and skin closed with a running subcuticular 4-0 Monocryl and Dermabond.  Dry bandages and a breast binder were placed.  The patient tolerated the procedure well was taken to PACU in stable condition.  EBL 30 mL or less.  Counts correct.  Complications none.  Chest x-ray is planned.  Interventional radiology will be contacted to evaluate the patient for Port-A-Cath insertion in radiology    [facility]. Dalbert Batman, M.D., FACS General and Minimally Invasive Surgery Breast and Colorectal Surgery   Addendum: I log on  to the [facility] website and reviewed her prescription medication history  09/11/2016 10:32 AM

## 2016-09-11 NOTE — Anesthesia Procedure Notes (Signed)
Procedure Name: LMA Insertion Date/Time: 09/11/2016 8:41 AM Performed by: Roderic Palau Pre-anesthesia Checklist: Patient identified, Emergency Drugs available, Suction available and Patient being monitored Patient Re-evaluated:Patient Re-evaluated prior to induction Oxygen Delivery Method: Circle system utilized Preoxygenation: Pre-oxygenation with 100% oxygen Induction Type: IV induction Ventilation: Mask ventilation without difficulty LMA: LMA inserted LMA Size: 4.0 Number of attempts: 1 Airway Equipment and Method: Bite block Placement Confirmation: positive ETCO2 Tube secured with: Tape Dental Injury: Teeth and Oropharynx as per pre-operative assessment

## 2016-09-11 NOTE — Discharge Instructions (Signed)
Breast Biopsy, Care After These instructions give you information about caring for yourself after your procedure. Your doctor may also give you more specific instructions. Call your doctor if you have any problems or questions after your procedure. Follow these instructions at home: Medicines  Take over-the-counter and prescription medicines only as told by your doctor.  Do not drive for 24 hours if you received a sedative.  Do not drink alcohol while taking pain medicine.  Do not drive or use heavy machinery while taking prescription pain medicine. Biopsy Site Care   Follow instructions from your doctor about how to take care of your cut from surgery (incision) or puncture area. Make sure you: ? Wash your hands with soap and water before you change your bandage. If you cannot use soap and water, use hand sanitizer. ? Change any bandages (dressings) as told by your doctor. ? Leave any stitches (sutures), skin glue, or skin tape (adhesive) strips in place. They may need to stay in place for 2 weeks or longer. If tape strips get loose and curl up, you may trim the loose edges. Do not remove tape strips completely unless your doctor says it is okay.  If you have stitches, keep them dry when you take a bath or a shower.  Check your cut or puncture area every day for signs of infection. Check for: ? More redness, swelling, or pain. ? More fluid or blood. ? Warmth. ? Pus or a bad smell.  Protect the biopsy area. Do not let the area get bumped. Activity  Avoid activities that could pull the biopsy site open. ? Avoid stretching. ? Avoid reaching. ? Avoid exercise. ? Avoid sports. ? Avoid lifting anything that is heavier than 3 pounds (1.4 kg).  Return to your normal activities as told by your doctor. Ask your doctor what activities are safe for you. General instructions  Continue your normal diet.  Wear a good support bra for as long as told by your doctor.  Get checked for  extra fluid in your body (lymphedema) as often as told by your doctor.  Keep all follow-up visits as told by your doctor. This is important. Contact a health care provider if:  You have more redness, swelling, or pain at the biopsy site.  You have more fluid or blood coming from your biopsy site.  Your biopsy site feels warm to the touch.  You have pus or a bad smell coming from the biopsy site.  Your biopsy site breaks open after the stitches, staples, or skin tape strips have been removed.  You have a rash.  You have a fever. Get help right away if:  You have more bleeding (more than a small spot) from the biopsy site.  You have trouble breathing.  You have red streaks around the biopsy site. This information is not intended to replace advice given to you by your health care provider. Make sure you discuss any questions you have with your health care provider. Document Released: 11/24/2008 Document Revised: 10/05/2015 Document Reviewed: 11/01/2014 Elsevier Interactive Patient Education  2018 [employer].  BRCA Gene Testing Why am I having this test? BRCA gene testing is done to check for the presence of harmful changes (mutations) in the BRCA1 gene or the BRCA2 gene (breast cancer susceptibility genes). If there is a mutation, the genes may not be able to help repair damaged cells in the body. As a result, the damaged cells may develop defects that can lead to certain  types of cancer. You may have this test if you have a family history of certain types of cancer, including cancer of the:  Breast.  Ovaries.  Fallopian tubes.  Peritoneum.  Pancreas.  Prostate.  What kind of sample is taken? The test requires either a sample of blood or a sample of cells from your saliva. If a sample of blood is needed, it will probably be collected by inserting a needle into a vein. If a sample of saliva is needed, you will get instructions about how to collect the sample. What do  the results mean? The test results can show whether you have a mutation in the BRCA1 or BRCA2 gene that increases your risk for certain cancers. Meaning of negative test results A negative test result means that you do not have a mutation in the BRCA1 or BRCA2 gene that is known to increase your risk for certain cancers. This does not mean that you will never get cancer. Talk with your health care provider or a genetic counselor about what this result means for you. Meaning of positive test results A positive test result means that you have a mutation in the BRCA1 or BRCA2 gene that increases your risk for certain cancers. Women with a positive test result have an increased risk for breast and ovarian cancer. Both women and men with a mutation have an increased risk for breast cancer and may be at greater risk for other types of cancer. Getting a positive test result does not mean that you will develop cancer. Talk with your health care provider or a genetic counselor about what this result means for you. You may be told that you are a carrier. This means that you can pass the mutation to your children. Meaning of ambiguous test results Ambiguous, inconclusive, or uncertain test results mean that there is a change in the BRCA1 or BRCA2 gene, but it is a change that has not been linked to cancer. Talk with your health care provider or a genetic counselor about what this result means for you. Talk with your health care provider to discuss your results, treatment options, and if necessary, the need for more tests. Talk with your health care provider if you have any questions about your results. How do I get my results? It is up to you to get your test results. Ask your health care provider, or the department that is doing the test, when your results will be ready. This information is not intended to replace advice given to you by your health care provider. Make sure you discuss any questions you have with  your health care provider. Document Released: 02/22/2004 Document Revised: 10/02/2015 Document Reviewed: 09/20/2015 Elsevier Interactive Patient Education  [employer].  Dr. Darrel Hoover office will schedule port a cath insertion in interventional radiology. They will call with details.     [place] Office Phone Number [phone]  BREAST BIOPSY/ PARTIAL MASTECTOMY: POST OP INSTRUCTIONS  Always review your discharge instruction sheet given to you by the facility where your surgery was performed.  IF YOU HAVE DISABILITY OR FAMILY LEAVE FORMS, YOU MUST BRING THEM TO THE OFFICE FOR PROCESSING.  DO NOT GIVE THEM TO YOUR DOCTOR.  1. A prescription for pain medication may be given to you upon discharge.  Take your pain medication as prescribed, if needed.  If narcotic pain medicine is not needed, then you may take acetaminophen (Tylenol) or ibuprofen (Advil) as needed. 2. Take your usually prescribed medications unless  otherwise directed 3. If you need a refill on your pain medication, please contact your pharmacy.  They will contact our office to request authorization.  Prescriptions will not be filled after 5pm or on week-ends. 4. You should eat very light the first 24 hours after surgery, such as soup, crackers, pudding, etc.  Resume your normal diet the day after surgery. 5. Most patients will experience some swelling and bruising in the breast.  Ice packs and a good support bra will help.  Swelling and bruising can take several days to resolve.  6. It is common to experience some constipation if taking pain medication after surgery.  Increasing fluid intake and taking a stool softener will usually help or prevent this problem from occurring.  A mild laxative (Milk of Magnesia or Miralax) should be taken according to package directions if there are no bowel movements after 48 hours. 7. Unless discharge instructions indicate otherwise, you may remove your bandages 24-48  hours after surgery, and you may shower at that time.  You may have steri-strips (small skin tapes) in place directly over the incision.  These strips should be left on the skin for 7-10 days.  If your surgeon used skin glue on the incision, you may shower in 24 hours.  The glue will flake off over the next 2-3 weeks.  Any sutures or staples will be removed at the office during your follow-up visit. 8. ACTIVITIES:  You may resume regular daily activities (gradually increasing) beginning the next day.  Wearing a good support bra or sports bra minimizes pain and swelling.  You may have sexual intercourse when it is comfortable. a. You may drive when you no longer are taking prescription pain medication, you can comfortably wear a seatbelt, and you can safely maneuver your car and apply brakes. b. RETURN TO WORK:  ______________________________________________________________________________________ 9. You should see your doctor in the office for a follow-up appointment approximately two weeks after your surgery.  Your doctors nurse will typically make your follow-up appointment when she calls you with your pathology report.  Expect your pathology report 2-3 business days after your surgery.  You may call to check if you do not hear from [place] after three days. 10. OTHER INSTRUCTIONS: _______________________________________________________________________________________________ _____________________________________________________________________________________________________________________________________ _____________________________________________________________________________________________________________________________________ _____________________________________________________________________________________________________________________________________  WHEN TO CALL YOUR DOCTOR: 1. Fever over 101.0 2. Nausea and/or vomiting. 3. Extreme swelling or bruising. 4. Continued bleeding from  incision. 5. Increased pain, redness, or drainage from the incision.  The clinic staff is available to answer your questions during regular business hours.  Please dont hesitate to call and ask to speak to one of the nurses for clinical concerns.  If you have a medical emergency, go to the nearest emergency room or call 911.  A surgeon from [facility] is always on call at the hospital.  For further questions, please visit [email or web address]      Post Anesthesia Home Care Instructions  Activity: Get plenty of rest for the remainder of the day. A responsible individual must stay with you for 24 hours following the procedure.  For the next 24 hours, DO NOT: -Drive a car -Paediatric nurse -Drink alcoholic beverages -Take any medication unless instructed by your physician -Make any legal decisions or sign important papers.  Meals: Start with liquid foods such as gelatin or soup. Progress to regular foods as tolerated. Avoid greasy, spicy, heavy foods. If nausea and/or vomiting occur, drink only clear liquids until the nausea and/or vomiting subsides. Call your physician if vomiting  continues.  Special Instructions/Symptoms: Your throat may feel dry or sore from the anesthesia or the breathing tube placed in your throat during surgery. If this causes discomfort, gargle with warm salt water. The discomfort should disappear within 24 hours.  If you had a scopolamine patch placed behind your ear for the management of post- operative nausea and/or vomiting:  1. The medication in the patch is effective for 72 hours, after which it should be removed.  Wrap patch in a tissue and discard in the trash. Wash hands thoroughly with soap and water. 2. You may remove the patch earlier than 72 hours if you experience unpleasant side effects which may include dry mouth, dizziness or visual disturbances. 3. Avoid touching the patch. Wash your hands with soap and water after contact  with the patch.

## 2016-09-11 NOTE — Anesthesia Procedure Notes (Addendum)
Anesthesia Regional Block: Pectoralis block   Pre-Anesthetic Checklist: ,, timeout performed, Correct Patient, Correct Site, Correct Laterality, Correct Procedure, Correct Position, site marked, Risks and benefits discussed, pre-op evaluation,  At surgeon's request and post-op pain management  Laterality: Left  Prep: Maximum Sterile Barrier Precautions used, chloraprep       Needles:  Injection technique: Single-shot  Needle Type: Echogenic Stimulator Needle     Needle Length: 9cm  Needle Gauge: 21     Additional Needles:   Procedures: ultrasound guided,,,,,,,,  Narrative:  Start time: 09/11/2016 8:07 AM End time: 09/11/2016 8:17 AM Injection made incrementally with aspirations every 5 mL. Anesthesiologist: Roderic Palau  Additional Notes: 2% Lidocaine skin wheel.

## 2016-09-11 NOTE — Anesthesia Preprocedure Evaluation (Addendum)
Anesthesia Evaluation  Patient identified by MRN, date of birth, ID band Patient awake    Reviewed: Allergy & Precautions, H&P , NPO status , Patient's Chart, lab work & pertinent test results  Airway Mallampati: II  TM Distance: >3 FB Neck ROM: Full    Dental no notable dental hx. (+) Teeth Intact, Dental Advisory Given   Pulmonary asthma ,    Pulmonary exam normal breath sounds clear to auscultation       Cardiovascular hypertension, Pt. on medications  Rhythm:Regular Rate:Normal     Neuro/Psych Anxiety Depression negative neurological ROS  negative psych ROS   GI/Hepatic negative GI ROS, Neg liver ROS,   Endo/Other  Hypothyroidism   Renal/GU negative Renal ROS  negative genitourinary   Musculoskeletal   Abdominal   Peds  Hematology negative hematology ROS (+)   Anesthesia Other Findings   Reproductive/Obstetrics negative OB ROS                            Anesthesia Physical Anesthesia Plan  ASA: II  Anesthesia Plan: General   Post-op Pain Management:  Regional for Post-op pain   Induction: Intravenous  PONV Risk Score and Plan: 4 or greater and Ondansetron, Dexamethasone and Midazolam  Airway Management Planned: LMA  Additional Equipment:   Intra-op Plan:   Post-operative Plan: Extubation in OR  Informed Consent: I have reviewed the patients History and Physical, chart, labs and discussed the procedure including the risks, benefits and alternatives for the proposed anesthesia with the patient or authorized representative who has indicated his/her understanding and acceptance.   Dental advisory given  Plan Discussed with: CRNA  Anesthesia Plan Comments:        Anesthesia Quick Evaluation

## 2016-09-11 NOTE — Interval H&P Note (Signed)
History and Physical Interval Note:  09/11/2016 7:39 AM  [name] [name]  has presented today for surgery, with the diagnosis of cancer left breast  The various methods of treatment have been discussed with the patient and family. After consideration of risks, benefits and other options for treatment, the patient has consented to  Procedure(s) with comments: LEFT BREAST LUMPECTOMY WITH RADIOACTIVE SEED AND LEFT AXILLARY SENTINEL LYMPH NODE BIOPSY WITH BLUE DYE INJECTION (Left) - PECTORAL BLOCK BLUE DYE INJECTION INSERTION PORT-A-CATH WITH ULTRA SOUND GUIDANCE (N/A) as a surgical intervention .  The patient's history has been reviewed, patient examined, no change in status, stable for surgery.  I have reviewed the patient's chart and labs.  Questions were answered to the patient's satisfaction.     Adin Hector

## 2016-09-11 NOTE — Transfer of Care (Signed)
  Last Vitals:  Vitals:   09/11/16 0815 09/11/16 0820  BP: 121/68 122/77  Pulse: 95 (!) 110  Resp: 15 (!) 24  Temp:      Last Pain:  Vitals:   09/11/16 0709  TempSrc: Oral        Immediate Anesthesia Transfer of Care Note  Patient: [name] [name]  Procedure(s) Performed: Procedure(s) (LRB): LEFT BREAST LUMPECTOMY WITH RADIOACTIVE SEED AND LEFT AXILLARY SENTINEL LYMPH NODE BIOPSY WITH BLUE DYE INJECTION (Left) ATTEMPTED INSERTION PORT-A-CATH WITH ULTRA SOUND GUIDANCE (N/A)  Patient Location: PACU  Anesthesia Type: General  Level of Consciousness: patient asleep  Airway & Oxygen Therapy: Patient Spontanous Breathing and Patient connected to face mask oxygen, oral airway placed  Post-op Assessment: Report given to PACU RN and Post -op Vital signs reviewed and stable  Post vital signs: Reviewed and stable  Complications: No apparent anesthesia complications

## 2016-09-11 NOTE — Progress Notes (Signed)
Nuc med staff performed nuc med inj. Pt tol well. [name] [name], called to bedside. Updated/provided emotional support.

## 2016-09-12 ENCOUNTER — Other Ambulatory Visit (HOSPITAL_COMMUNITY): Payer: Self-pay | Admitting: General Surgery

## 2016-09-12 ENCOUNTER — Other Ambulatory Visit: Payer: Self-pay | Admitting: Physician Assistant

## 2016-09-12 ENCOUNTER — Encounter (HOSPITAL_BASED_OUTPATIENT_CLINIC_OR_DEPARTMENT_OTHER): Payer: Self-pay | Admitting: General Surgery

## 2016-09-12 ENCOUNTER — Other Ambulatory Visit: Payer: Self-pay | Admitting: Radiology

## 2016-09-12 DIAGNOSIS — C50919 Malignant neoplasm of unspecified site of unspecified female breast: Secondary | ICD-10-CM

## 2016-09-12 NOTE — Addendum Note (Signed)
Addendum  created 09/12/16 0902 by Tawni Millers, CRNA   Charge Capture section accepted

## 2016-09-13 ENCOUNTER — Other Ambulatory Visit (HOSPITAL_COMMUNITY): Payer: Self-pay | Admitting: General Surgery

## 2016-09-13 ENCOUNTER — Encounter (HOSPITAL_COMMUNITY): Payer: Self-pay

## 2016-09-13 ENCOUNTER — Ambulatory Visit (HOSPITAL_COMMUNITY)
Admit: 2016-09-13 | Discharge: 2016-09-13 | Disposition: A | Discharge status: Home / Self Care | Payer: Medicare Other | Source: Ambulatory Visit | Attending: General Surgery | Admitting: General Surgery

## 2016-09-13 ENCOUNTER — Ambulatory Visit (HOSPITAL_COMMUNITY)
Admission: RE | Admit: 2016-09-13 | Discharge: 2016-09-13 | Disposition: A | Discharge status: Home / Self Care | Payer: Medicare Other | Source: Ambulatory Visit | Attending: General Surgery | Admitting: General Surgery

## 2016-09-13 DIAGNOSIS — E538 Deficiency of other specified B group vitamins: Secondary | ICD-10-CM | POA: Insufficient documentation

## 2016-09-13 DIAGNOSIS — J309 Allergic rhinitis, unspecified: Secondary | ICD-10-CM | POA: Diagnosis not present

## 2016-09-13 DIAGNOSIS — E559 Vitamin D deficiency, unspecified: Secondary | ICD-10-CM | POA: Insufficient documentation

## 2016-09-13 DIAGNOSIS — E039 Hypothyroidism, unspecified: Secondary | ICD-10-CM | POA: Insufficient documentation

## 2016-09-13 DIAGNOSIS — C50912 Malignant neoplasm of unspecified site of left female breast: Secondary | ICD-10-CM | POA: Insufficient documentation

## 2016-09-13 DIAGNOSIS — C50919 Malignant neoplasm of unspecified site of unspecified female breast: Secondary | ICD-10-CM

## 2016-09-13 DIAGNOSIS — L409 Psoriasis, unspecified: Secondary | ICD-10-CM | POA: Insufficient documentation

## 2016-09-13 DIAGNOSIS — Z539 Procedure and treatment not carried out, unspecified reason: Secondary | ICD-10-CM | POA: Diagnosis not present

## 2016-09-13 DIAGNOSIS — Z9011 Acquired absence of right breast and nipple: Secondary | ICD-10-CM | POA: Diagnosis not present

## 2016-09-13 DIAGNOSIS — I10 Essential (primary) hypertension: Secondary | ICD-10-CM | POA: Insufficient documentation

## 2016-09-13 DIAGNOSIS — F419 Anxiety disorder, unspecified: Secondary | ICD-10-CM | POA: Diagnosis not present

## 2016-09-13 DIAGNOSIS — Z452 Encounter for adjustment and management of vascular access device: Secondary | ICD-10-CM | POA: Diagnosis not present

## 2016-09-13 DIAGNOSIS — Z882 Allergy status to sulfonamides status: Secondary | ICD-10-CM | POA: Insufficient documentation

## 2016-09-13 DIAGNOSIS — E785 Hyperlipidemia, unspecified: Secondary | ICD-10-CM | POA: Diagnosis not present

## 2016-09-13 DIAGNOSIS — Z853 Personal history of malignant neoplasm of breast: Secondary | ICD-10-CM | POA: Diagnosis not present

## 2016-09-13 HISTORY — PX: IR US GUIDE VASC ACCESS RIGHT: IMG2390

## 2016-09-13 HISTORY — PX: IR FLUORO GUIDE PORT INSERTION RIGHT: IMG5741

## 2016-09-13 LAB — CBC WITH DIFFERENTIAL/PLATELET
Basophils Absolute: 0 10*3/uL (ref 0.0–0.1)
Basophils Relative: 0 %
EOS PCT: 1 %
Eosinophils Absolute: 0.1 10*3/uL (ref 0.0–0.7)
HEMATOCRIT: 35 % — AB (ref 36.0–46.0)
Hemoglobin: 11.4 g/dL — ABNORMAL LOW (ref 12.0–15.0)
LYMPHS PCT: 35 %
Lymphs Abs: 2.8 10*3/uL (ref 0.7–4.0)
MCH: 29.5 pg (ref 26.0–34.0)
MCHC: 32.6 g/dL (ref 30.0–36.0)
MCV: 90.7 fL (ref 78.0–100.0)
MONO ABS: 0.7 10*3/uL (ref 0.1–1.0)
MONOS PCT: 9 %
NEUTROS ABS: 4.5 10*3/uL (ref 1.7–7.7)
Neutrophils Relative %: 55 %
Platelets: 230 10*3/uL (ref 150–400)
RBC: 3.86 MIL/uL — ABNORMAL LOW (ref 3.87–5.11)
RDW: 14 % (ref 11.5–15.5)
WBC: 8 10*3/uL (ref 4.0–10.5)

## 2016-09-13 LAB — BASIC METABOLIC PANEL
ANION GAP: 6 (ref 5–15)
BUN: 20 mg/dL (ref 6–20)
CO2: 27 mmol/L (ref 22–32)
Calcium: 8.9 mg/dL (ref 8.9–10.3)
Chloride: 108 mmol/L (ref 101–111)
Creatinine, Ser: 1.05 mg/dL — ABNORMAL HIGH (ref 0.44–1.00)
GFR calc Af Amer: 58 mL/min — ABNORMAL LOW (ref >60)
GFR calc non Af Amer: 50 mL/min — ABNORMAL LOW (ref >60)
GLUCOSE: 106 mg/dL — AB (ref 65–99)
POTASSIUM: 4.1 mmol/L (ref 3.5–5.1)
Sodium: 141 mmol/L (ref 135–145)

## 2016-09-13 LAB — PROTIME-INR
INR: 0.89
Prothrombin Time: 12 seconds (ref 11.4–15.2)

## 2016-09-13 MED ORDER — FENTANYL CITRATE (PF) 100 MCG/2ML IJ SOLN
INTRAMUSCULAR | Status: AC
  Filled 2016-09-13: qty 6

## 2016-09-13 MED ORDER — SODIUM CHLORIDE 0.9 % IV SOLN: INTRAVENOUS | Status: DC

## 2016-09-13 MED ORDER — CEFAZOLIN SODIUM-DEXTROSE 2-4 GM/100ML-% IV SOLN
INTRAVENOUS | Status: AC
  Administered 2016-09-13: 2000 mg
  Filled 2016-09-13: qty 100

## 2016-09-13 MED ORDER — LIDOCAINE HCL (PF) 1 % IJ SOLN
INTRAMUSCULAR | Status: AC
  Filled 2016-09-13: qty 30

## 2016-09-13 MED ORDER — MIDAZOLAM HCL 2 MG/2ML IJ SOLN
INTRAMUSCULAR | Status: AC | PRN
  Administered 2016-09-13 (×2): 1 mg via INTRAVENOUS

## 2016-09-13 MED ORDER — LIDOCAINE HCL (PF) 1 % IJ SOLN
INTRAMUSCULAR | Status: AC | PRN
  Administered 2016-09-13: 15 mL

## 2016-09-13 MED ORDER — CEFAZOLIN SODIUM-DEXTROSE 2-4 GM/100ML-% IV SOLN: 2.0000 g | INTRAVENOUS | Status: DC

## 2016-09-13 MED ORDER — MIDAZOLAM HCL 2 MG/2ML IJ SOLN
INTRAMUSCULAR | Status: AC
  Filled 2016-09-13: qty 6

## 2016-09-13 MED ORDER — FENTANYL CITRATE (PF) 100 MCG/2ML IJ SOLN
INTRAMUSCULAR | Status: AC | PRN
  Administered 2016-09-13 (×2): 25 ug via INTRAVENOUS
  Administered 2016-09-13: 50 ug via INTRAVENOUS

## 2016-09-13 MED ORDER — HEPARIN SOD (PORK) LOCK FLUSH 100 UNIT/ML IV SOLN
INTRAVENOUS | Status: AC | PRN
  Administered 2016-09-13: 500 [IU] via INTRAVENOUS

## 2016-09-13 MED ORDER — HEPARIN SOD (PORK) LOCK FLUSH 100 UNIT/ML IV SOLN
INTRAVENOUS | Status: AC
  Filled 2016-09-13: qty 5

## 2016-09-13 NOTE — Discharge Instructions (Signed)
Implanted Port Insertion, Care After °This sheet gives you information about how to care for yourself after your procedure. Your health care provider may also give you more specific instructions. If you have problems or questions, contact your health care provider. °What can I expect after the procedure? °After your procedure, it is common to have: °· Discomfort at the port insertion site. °· Bruising on the skin over the port. This should improve over 3-4 days. ° °Follow these instructions at home: °Port care °· After your port is placed, you will get a manufacturer's information card. The card has information about your port. Keep this card with you at all times. °· Take care of the port as told by your health care provider. Ask your health care provider if you or a family member can get training for taking care of the port at home. A home health care nurse may also take care of the port. °· Make sure to remember what type of port you have. °Incision care °· Follow instructions from your health care provider about how to take care of your port insertion site. Make sure you: °? Wash your hands with soap and water before you change your bandage (dressing). If soap and water are not available, use hand sanitizer. °? Change your dressing as told by your health care provider. °? Leave stitches (sutures), skin glue, or adhesive strips in place. These skin closures may need to stay in place for 2 weeks or longer. If adhesive strip edges start to loosen and curl up, you may trim the loose edges. Do not remove adhesive strips completely unless your health care provider tells you to do that. °· Check your port insertion site every day for signs of infection. Check for: °? More redness, swelling, or pain. °? More fluid or blood. °? Warmth. °? Pus or a bad smell. °General instructions °· Do not take baths, swim, or use a hot tub until your health care provider approves. °· Do not lift anything that is heavier than 10 lb (4.5  kg) for a week, or as told by your health care provider. °· Ask your health care provider when it is okay to: °? Return to work or school. °? Resume usual physical activities or sports. °· Do not drive for 24 hours if you were given a medicine to help you relax (sedative). °· Take over-the-counter and prescription medicines only as told by your health care provider. °· Wear a medical alert bracelet in case of an emergency. This will tell any health care providers that you have a port. °· Keep all follow-up visits as told by your health care provider. This is important. °Contact a health care provider if: °· You cannot flush your port with saline as directed, or you cannot draw blood from the port. °· You have a fever or chills. °· You have more redness, swelling, or pain around your port insertion site. °· You have more fluid or blood coming from your port insertion site. °· Your port insertion site feels warm to the touch. °· You have pus or a bad smell coming from the port insertion site. °Get help right away if: °· You have chest pain or shortness of breath. °· You have bleeding from your port that you cannot control. °Summary °· Take care of the port as told by your health care provider. °· Change your dressing as told by your health care provider. °· Keep all follow-up visits as told by your health care provider. °  This information is not intended to replace advice given to you by your health care provider. Make sure you discuss any questions you have with your health care provider. °Document Released: 11/18/2012 Document Revised: 12/20/2015 Document Reviewed: 12/20/2015 °Elsevier Interactive Patient Education © 2017 [employer] ° °

## 2016-09-13 NOTE — Sedation Documentation (Signed)
Pt lying in supine position comfortable.  Pt still awake.

## 2016-09-13 NOTE — Procedures (Signed)
Placement of right jugular portacath.  Tip at SVC/RA junction.  Minimal blood loss.  No immediate complication.

## 2016-09-13 NOTE — Sedation Documentation (Signed)
Patient denies pain and is resting comfortably.  

## 2016-09-13 NOTE — H&P (Signed)
Chief Complaint: breast cancer  Referring Physician:Dr. [name]  Supervising Physician: Markus Daft  Patient Status: [facility] - Out-pt  HPI: [name] [name] is a 76 y.o. female with a history of breast cancer for which she had a mastectomy on the right about ten years ago with lymph node dissection.  She has since been found to have another malignancy in her left breast.  She underwent a lumpectomy and a recent attempt to surgically place a PAC.  This was unable to be done and she has been referred to IR for placement.  She has no complaints today.  Past Medical History:  Past Medical History:  Diagnosis Date  . Allergic rhinitis   . Anxiety   . Breast cancer ([place]) hx 2004   recurrent 2006 DR Magrinat  . HTN (hypertension)   . Hyperlipidemia   . Hypothyroidism   . Personal history of chemotherapy 2002  . Personal history of radiation therapy 2002  . Psoriasis   . S/P thyroidectomy 07/2005   2 cm largest diameter (1 other tiny focus)/ i-131 rx 99 [facility] 08/2005  . Thyroid cancer ([place])    Papillary Stage 1 - Dr Loanne Drilling  . Vitamin B12 deficiency 2009  . Vitamin D deficiency 2009    Past Surgical History:  Past Surgical History:  Procedure Laterality Date  . BREAST LUMPECTOMY    . BREAST LUMPECTOMY WITH RADIOACTIVE SEED AND SENTINEL LYMPH NODE BIOPSY Left 09/11/2016   Procedure: LEFT BREAST LUMPECTOMY WITH RADIOACTIVE SEED AND LEFT AXILLARY SENTINEL LYMPH NODE BIOPSY WITH BLUE DYE INJECTION;  Surgeon: [name], MD;  Location: [place];  Service: General;  Laterality: Left;  [name] MASTECTOMY     Right  . PORTACATH PLACEMENT N/A 09/11/2016   Procedure: ATTEMPTED INSERTION PORT-A-CATH WITH ULTRA SOUND GUIDANCE;  Surgeon: [name], MD;  Location: [place];  Service: General;  Laterality: N/A;  . REDUCTION MAMMAPLASTY Left 2005  . THYROIDECTOMY  2007    Family History:  Family History  Problem Relation Age of Onset  . Stroke Mother   .  Allergies Mother   . Asthma Mother   . Clotting disorder Mother   . Heart disease Father 39       MI  . Allergies Sister   . Asthma Sister   . Cancer Sister        Breast  . Asthma Brother   . Allergies Daughter   . Asthma Daughter   . Allergies Sister     Social History:  reports that she has never smoked. She has never used smokeless tobacco. She reports that she does not drink alcohol or use drugs.  Allergies:  Allergies  Allergen Reactions  . Pneumococcal Vaccines Other (See Comments)    Was really sick   . Sulfa Antibiotics Other (See Comments)    Pt believes it was hives  . Sulfacetamide Sodium-Sulfur   . Tape Itching, Dermatitis and Rash    Medications: Medications reviewed in epic  Please HPI for pertinent positives, otherwise complete 10 system ROS negative.  Mallampati Score: MD Evaluation Airway: WNL Heart: WNL Abdomen: WNL Chest/ Lungs: Other (comments) Chest/ lungs comments: left breast lumpectomy,  ASA  Classification: 3 Mallampati/Airway Score: Two  Physical Exam: Temp (F)   98.4  98.4 (36.9)  08/03 0708  Pulse Rate   84  84  08/03 0708  BP   154/82  154/82  08/03 0708  SpO2 (%)   96  96  08/03 0708  Weight (lb)   140  140 lb (63.5 kg)  08   General: pleasant, WD, WN white female who is laying in bed in NAD HEENT: head is normocephalic, atraumatic.  Sclera are noninjected.  PERRL.  Ears and nose without any masses or lesions.  Mouth is pink and moist Heart: regular, rate, and rhythm.  Normal s1,s2. No obvious murmurs, gallops, or rubs noted.  Palpable radial and pedal pulses bilaterally Lungs: CTAB, no wheezes, rhonchi, or rales noted.  Respiratory effort nonlabored Abd: soft, NT, ND, +BS, no masses, hernias, or organomegaly Psych: A&Ox3 with an appropriate affect.   Labs: Results for orders placed or performed during the hospital encounter of 09/13/16 (from the past 48 hour(s))  Basic metabolic panel     Status: Abnormal   Collection  Time: 09/13/16  7:28 AM  Result Value Ref Range   Sodium 141 135 - 145 mmol/L   Potassium 4.1 3.5 - 5.1 mmol/L   Chloride 108 101 - 111 mmol/L   CO2 27 22 - 32 mmol/L   Glucose, Bld 106 (H) 65 - 99 mg/dL   BUN 20 6 - 20 mg/dL   Creatinine, Ser 1.05 (H) 0.44 - 1.00 mg/dL   Calcium 8.9 8.9 - 10.3 mg/dL   GFR calc non Af Amer 50 (L) >60 mL/min   GFR calc Af Amer 58 (L) >60 mL/min    Comment: (NOTE) The eGFR has been calculated using the CKD EPI equation. This calculation has not been validated in all clinical situations. eGFR's persistently <60 mL/min signify possible Chronic Kidney Disease.    Anion gap 6 5 - 15  CBC with Differential/Platelet     Status: Abnormal   Collection Time: 09/13/16  7:28 AM  Result Value Ref Range   WBC 8.0 4.0 - 10.5 K/uL   RBC 3.86 (L) 3.87 - 5.11 MIL/uL   Hemoglobin 11.4 (L) 12.0 - 15.0 g/dL   HCT 35.0 (L) 36.0 - 46.0 %   MCV 90.7 78.0 - 100.0 fL   MCH 29.5 26.0 - 34.0 pg   MCHC 32.6 30.0 - 36.0 g/dL   RDW 14.0 11.5 - 15.5 %   Platelets 230 150 - 400 K/uL   Neutrophils Relative % 55 %   Neutro Abs 4.5 1.7 - 7.7 K/uL   Lymphocytes Relative 35 %   Lymphs Abs 2.8 0.7 - 4.0 K/uL   Monocytes Relative 9 %   Monocytes Absolute 0.7 0.1 - 1.0 K/uL   Eosinophils Relative 1 %   Eosinophils Absolute 0.1 0.0 - 0.7 K/uL   Basophils Relative 0 %   Basophils Absolute 0.0 0.0 - 0.1 K/uL  Protime-INR     Status: None   Collection Time: 09/13/16  7:28 AM  Result Value Ref Range   Prothrombin Time 12.0 11.4 - 15.2 seconds   INR 0.89     Imaging: Mm Breast Surgical Specimen  Result Date: 09/11/2016 CLINICAL DATA:  Status post left breast lumpectomy today after earlier radioactive seed localization. EXAM: SPECIMEN RADIOGRAPH OF THE LEFT BREAST COMPARISON:  Previous exam(s). FINDINGS: Status post excision of the left breast. The radioactive seed and biopsy marker clip are present, completely intact, and were marked for pathology. The positions of the radioactive  seed and biopsy marker clip within the specimen were discussed with the OR staff during the procedure. IMPRESSION: Specimen radiograph of the left breast. Electronically Signed   By: Franki Cabot M.D.   On: 09/11/2016 10:01   Dg Chest  Port 1 View  Result Date: 09/11/2016 CLINICAL DATA:  Failed attempted Port a catheter placement. Evaluate for pneumothorax. EXAM: PORTABLE CHEST 1 VIEW COMPARISON:  05/17/2008 ; 11/19/2007 FINDINGS: Normal cardiac silhouette and mediastinal contours with atherosclerotic plaque within the thoracic aorta. There is mild elevation of the right hemidiaphragm. One venous congestion without frank evidence of edema. Minimal bilateral infrahilar opacities favored to represent atelectasis. No pleural effusion or pneumothorax. Post right-sided mastectomy. Surgical clips overlie the right axilla. Additional surgical clips overlie the left breast. No radiopaque foreign body. IMPRESSION: 1. No evidence of complication following attempted Port a catheter placement. Specifically, no definite pneumothorax. 2.  Aortic Atherosclerosis (ICD10-I70.0). Electronically Signed   By: Sandi Mariscal M.D.   On: 09/11/2016 11:30   Dg C-arm 1-60 Min-no Report  Result Date: 09/11/2016 Fluoroscopy was utilized by the requesting physician.  No radiographic interpretation.    Assessment/Plan 1. Breast cancer  We will plan to proceed today with PAC placement.  Her labs and vitals have been reviewed.  Risks and benefits discussed with the patient including, but not limited to bleeding, infection, pneumothorax, or fibrin sheath development and need for additional procedures. All of the patient's questions were answered, patient is agreeable to proceed. Consent signed and in chart.   Thank you for this interesting consult.  I greatly enjoyed meeting Trinadee T Hennington and look forward to participating in their care.  A copy of this report was sent to the requesting provider on this date.  Electronically  Signed: Henreitta Cea 09/13/2016, 9:17 AM   I spent a total of  30 Minutes   in face to face in clinical consultation, greater than 50% of which was counseling/coordinating care for breast cancer

## 2016-09-15 MED ORDER — PROCHLORPERAZINE MALEATE 10 MG PO TABS: 10.0000 mg | ORAL_TABLET | Freq: Four times a day (QID) | ORAL | 1 refills | Status: DC | PRN

## 2016-09-15 MED ORDER — LIDOCAINE-PRILOCAINE 2.5-2.5 % EX CREA: TOPICAL_CREAM | CUTANEOUS | 3 refills | Status: DC

## 2016-09-15 MED ORDER — DEXAMETHASONE 4 MG PO TABS: 8.0000 mg | ORAL_TABLET | Freq: Every day | ORAL | 1 refills | Status: DC

## 2016-09-15 NOTE — Progress Notes (Signed)
ID: [name]   DOB: [birth year]  MR#: [id number]  NLG#:[id number]  PCP: [name], MD Patient Care Team: [name], MD as PCP - General Dung Prien, [name], MD as Consulting Physician (Oncology) [name], MD as Consulting Physician (General Surgery) [name], MD as Consulting Physician (Gastroenterology)  BREAST CANCER HISTORY: From the recent summary note:  I last saw Gwenlyn in 2013 when she was released from follow-up from her right breast cancer recurrence in 2004. She is status post right mastectomy and adjuvant chemotherapy for that triple negative tumor. More recently, on 07/16/2016 she underwent left screening mammography at the [facility] on 07/16/2016. This found the breast density to be category B. There was a possible asymmetry in the left breast, and on 07/19/2016 she underwent left diagnostic mammography with tomography and left breast ultrasonography. This confirmed a well circumscribed oval mass at the 9:00 position of the left breast measuring approximately 0.8 cm. This was not palpable. On ultrasonography the mass measured 0.6 cm and it was located at the 9:00 radiant 4 cm from the nipple. The left axilla was sonographically benign.  On June 11 Adamary underwent biopsy of the left breast mass in question and this showed ([id number]) and invasive ductal carcinoma, grade 3, estrogen receptor 5% positive with weak staining intensity, progesterone receptor negative, with an MIB-1 of 80%, and HER-2 nonamplified, the signals ratio being 1.46 and the number per cell 1.90.  Her subsequent history is as detailed below.   INTERVAL HISTORY: Shir returns today for follow-up and treatment of what is essentially a triple negative breast cancer. She underwent left lumpectomy and sentinel lymph node sampling 09/11/2016. The final pathology ([id number]) showed a 0.8 cm invasive ductal carcinoma, grade 3, with negative margins. All 3 sentinel lymph nodes  were clear. The prognostic panel was not repeated.  On 09/13/2016 she had a port placed with no immediate complications.  She is here today for day 1 cycle 1 of cyclophosphamide and gemcitabine to be given days 1 and 8 of each 21 day cycle, repeated for 4 cycles.   was evaluated in the breast clinic 08/07/2016. Her case was also presented at the multidisciplinary breast cancer conference 07/31/2016. At that time a preliminary plan was proposed: Lumpectomy with sentinel lymph node sampling, consideration of chemotherapy (carboplatin ), followed by adjuvant radiation, then consideration of anti-estrogens.  REVIEW OF SYSTEMS: There were no specific symptoms leading to the original mammogram, which was routinely scheduled. The patient denies unusual headaches, visual changes, nausea, vomiting, stiff neck, dizziness, or gait imbalance. There has been no cough, phlegm production, or pleurisy, no chest pain or pressure, and no change in bowel or bladder habits. The patient denies fever, rash, bleeding, unexplained fatigue or unexplained weight loss. A detailed review of systems was otherwise entirely negative.   PAST MEDICAL HISTORY: Past Medical History:  Diagnosis Date  . Allergic rhinitis   . Anxiety   . Breast cancer ([place]) hx 2004   recurrent 2006 DR Ruhaan Nordahl  . HTN (hypertension)   . Hyperlipidemia   . Hypothyroidism   . Personal history of chemotherapy 2002  . Personal history of radiation therapy 2002  . Psoriasis   . S/P thyroidectomy 07/2005   2 cm largest diameter (1 other tiny focus)/ i-131 rx 99 [facility] 08/2005  . Thyroid cancer ([place])    Papillary Stage 1 - Dr Loanne Drilling  . Vitamin B12 deficiency 2009  . Vitamin D deficiency 2009    PAST SURGICAL HISTORY:  Past Surgical History:  Procedure Laterality Date  . BREAST LUMPECTOMY    . BREAST LUMPECTOMY WITH RADIOACTIVE SEED AND SENTINEL LYMPH NODE BIOPSY Left 09/11/2016   Procedure: LEFT BREAST LUMPECTOMY WITH RADIOACTIVE SEED AND LEFT  AXILLARY SENTINEL LYMPH NODE BIOPSY WITH BLUE DYE INJECTION;  Surgeon: [name], MD;  Location: [facility];  Service: General;  Laterality: Left;  . IR FLUORO GUIDE PORT INSERTION RIGHT  09/13/2016  . IR US GUIDE VASC ACCESS RIGHT  09/13/2016  . MASTECTOMY     Right  . PORTACATH PLACEMENT N/A 09/11/2016   Procedure: ATTEMPTED INSERTION PORT-A-CATH WITH ULTRA SOUND GUIDANCE;  Surgeon: [name], MD;  Location: [facility];  Service: General;  Laterality: N/A;  . REDUCTION MAMMAPLASTY Left 2005  . THYROIDECTOMY  2007    FAMILY HISTORY Family History  Problem Relation Age of Onset  . Stroke Mother   . Allergies Mother   . Asthma Mother   . Clotting disorder Mother   . Heart disease Father 68       MI  . Allergies Sister   . Asthma Sister   . Cancer Sister        Breast  . Asthma Brother   . Allergies Daughter   . Asthma Daughter   . Allergies Sister   Family history includes a sister, a paternal aunt, and a paternal cousin with breast cancer all older then 10 at diagnosis  GYN HISTORY: Menarche age 66, first live birth age 67. She is GX P3. She went through the change of life age 56. She did not take hormone replacement.  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 Her husband [name] is retired from [employer]. Her children are [name], who works for time [employer] in [place], and Springer, who lives in [place] and works for [employer]. The third child, [name], died in an automobile accident at age 52. The patient has one grandchild. 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 Smoking status: Never Smoker  . Smokeless tobacco: Never Used  . Alcohol use No     Colonoscopy:  PAP:  Bone density:  Lipid panel:  Allergies  Allergen Reactions  . Pneumococcal Vaccines Other (See Comments)    Was really sick   . Sulfa  Antibiotics Other (See Comments)    Pt believes it was hives  . Sulfacetamide Sodium-Sulfur   . Tape Itching, Dermatitis and Rash    Current Outpatient Prescriptions  Medication Sig Dispense Refill  . ALPRAZolam (XANAX) 0.25 MG tablet Take 0.125-0.25 mg by mouth 2 (two) times daily as needed for anxiety or sleep (Pt takes a half of a tablet every evening).     . cholecalciferol (VITAMIN D) 1000 units tablet Take 1,000 Units by mouth daily.    . cyanocobalamin (,VITAMIN B-12,) 1000 MCG/ML injection 1 ml q 2 wks (Patient taking differently: Inject 1,000 mcg into the muscle See admin instructions. Every 60 days) 10 mL 6  . DULoxetine (CYMBALTA) 60 MG capsule Take 60 mg by mouth every morning.     [name] HYDROcodone-acetaminophen (NORCO) 5-325 MG tablet Take 1-2 tablets by mouth every 6 (six) hours as needed for moderate pain or severe pain. 30 tablet 0  . levothyroxine (SYNTHROID, LEVOTHROID) 112 MCG tablet TAKE 1 TABLET (112 MCG TOTAL) BY MOUTH DAILY. 90 tablet 3  . losartan (COZAAR) 100 MG  tablet TAKE 1 TABLET BY MOUTH EVERY DAY FOR BLOOD PRESSURE 90 tablet 1  . rosuvastatin (CRESTOR) 20 MG tablet Take 1 tablet (20 mg total) by mouth daily. 90 tablet 2  . Syringe/Needle, Disp, (B-D ECLIPSE SYRINGE) 30G X 1/2" 1 ML MISC 1 each by Does not apply route 1 day or 1 dose. For Vit B12 inj 50 each 11  . vitamin B-12 (CYANOCOBALAMIN) 1000 MCG tablet Take 1,000 mcg by mouth every other day.     No current facility-administered medications for this visit.     OBJECTIVE: [name] Who appears well  There were no vitals filed for this visit.   There is no height or weight on file to calculate BMI.    ECOG FS: 0  Sclerae unicteric, pupils round and equal Oropharynx clear and moist No cervical or supraclavicular adenopathy Lungs no rales or rhonchi Heart regular rate and rhythm Abd soft, nontender, positive bowel sounds MSK no focal spinal tenderness, no upper extremity lymphedema Neuro:  nonfocal, well oriented, appropriate affect Breasts: The right breast is status post mastectomy. She has undergone tram reconstruction. There is no evidence of local recurrence. The left breast shows no masses, no skin or nipple changes. Both axillae are benign.  LAB RESULTS: Lab Results  Component Value Date   WBC 8.0 09/13/2016   NEUTROABS 4.5 09/13/2016   HGB 11.4 (L) 09/13/2016   HCT 35.0 (L) 09/13/2016   MCV 90.7 09/13/2016   PLT 230 09/13/2016      Chemistry      Component Value Date/Time   NA 141 09/13/2016 0728   NA 143 08/07/2016 1538   K 4.1 09/13/2016 0728   K 4.6 08/07/2016 1538   CL 108 09/13/2016 0728   CL 104 10/09/2011 1403   CO2 27 09/13/2016 0728   CO2 27 08/07/2016 1538   BUN 20 09/13/2016 0728   BUN 15.6 08/07/2016 1538   CREATININE 1.05 (H) 09/13/2016 0728   CREATININE 1.3 (H) 08/07/2016 1538      Component Value Date/Time   CALCIUM 8.9 09/13/2016 0728   CALCIUM 10.5 (H) 08/07/2016 1538   ALKPHOS 110 08/07/2016 1538   AST 23 08/07/2016 1538   ALT 17 08/07/2016 1538   BILITOT 0.75 08/07/2016 1538       Lab Results  Component Value Date   LABCA2 16 10/09/2011    No components found for: LABCA125   Recent Labs Lab 09/13/16 0728  INR 0.89    Urinalysis    Component Value Date/Time   COLORURINE YELLOW 06/05/2015 1559   APPEARANCEUR CLEAR 06/05/2015 1559   LABSPEC 1.020 06/05/2015 1559   PHURINE 5.5 06/05/2015 1559   GLUCOSEU NEGATIVE 06/05/2015 1559   HGBUR NEGATIVE 06/05/2015 1559   BILIRUBINUR NEGATIVE 06/05/2015 1559   KETONESUR NEGATIVE 06/05/2015 1559   UROBILINOGEN 0.2 06/05/2015 1559   NITRITE NEGATIVE 06/05/2015 1559   LEUKOCYTESUR MODERATE (A) 06/05/2015 1559    STUDIES: Mm Breast Surgical Specimen  Result Date: 09/11/2016 CLINICAL DATA:  Status post left breast lumpectomy today after earlier radioactive seed localization. EXAM: SPECIMEN RADIOGRAPH OF THE LEFT BREAST COMPARISON:  Previous exam(s). FINDINGS: Status post  excision of the left breast. The radioactive seed and biopsy marker clip are present, completely intact, and were marked for pathology. The positions of the radioactive seed and biopsy marker clip within the specimen were discussed with the OR staff during the procedure. IMPRESSION: Specimen radiograph of the left breast. Electronically Signed   By: Roxy Horseman.D.  On: 09/11/2016 10:01   Ir US Guide Vasc Access Right  Result Date: 09/13/2016 INDICATION: 76 year old female with history of breast cancer. Previous right mastectomy. Recent left breast lumpectomy. Patient needs Port-A-Cath for additional treatment. EXAM: FLUOROSCOPIC AND ULTRASOUND GUIDED PLACEMENT OF A SUBCUTANEOUS PORT COMPARISON:  None. MEDICATIONS: Ancef 2 g; The antibiotic was administered within an appropriate time interval prior to skin puncture. ANESTHESIA/SEDATION: Versed 2.0 mg IV; Fentanyl 100 mcg IV; Moderate Sedation Time:  44 minutes The patient was continuously monitored during the procedure by the interventional radiology nurse under my direct supervision. FLUOROSCOPY TIME:  54 seconds, 2.2 mGy COMPLICATIONS: None immediate. PROCEDURE: The procedure, risks, benefits, and alternatives were explained to the patient. Questions regarding the procedure were encouraged and answered. The patient understands and consents to the procedure. Patient was placed supine on the interventional table. Ultrasound confirmed a patent right internal jugular vein. The right chest and neck were cleaned with a skin antiseptic and a sterile drape was placed. Maximal barrier sterile technique was utilized including caps, mask, sterile gowns, sterile gloves, sterile drape, hand hygiene and skin antiseptic. The right neck was anesthetized with 1% lidocaine. Small incision was made in the right neck with a blade. Micropuncture set was placed in the right internal jugular vein with ultrasound guidance. The micropuncture wire was used for measurement purposes.  The right chest was anesthetized with 1% lidocaine with epinephrine. #15 blade was used to make an incision and a subcutaneous port pocket was formed. [place] was assembled. Subcutaneous tunnel was formed with a stiff tunneling device. The port catheter was brought through the subcutaneous tunnel. The port was placed in the subcutaneous pocket and sutured in place. The micropuncture set was exchanged for a peel-away sheath. The catheter was placed through the peel-away sheath and the tip was positioned at the superior cavoatrial junction. Catheter placement was confirmed with fluoroscopy. The port was accessed and flushed with heparinized saline. The port pocket was closed using two layers of absorbable sutures and Dermabond. The vein skin site was closed using a single layer of absorbable suture and Dermabond. Sterile dressings were applied. Patient tolerated the procedure well without an immediate complication. Ultrasound and fluoroscopic images were taken and saved for this procedure. IMPRESSION: Placement of a right chest subcutaneous port device. Electronically Signed   By: Markus Daft M.D.   On: 09/13/2016 10:38   Dg Chest Port 1 View  Result Date: 09/11/2016 CLINICAL DATA:  Failed attempted Port a catheter placement. Evaluate for pneumothorax. EXAM: PORTABLE CHEST 1 VIEW COMPARISON:  05/17/2008 ; 11/19/2007 FINDINGS: Normal cardiac silhouette and mediastinal contours with atherosclerotic plaque within the thoracic aorta. There is mild elevation of the right hemidiaphragm. One venous congestion without frank evidence of edema. Minimal bilateral infrahilar opacities favored to represent atelectasis. No pleural effusion or pneumothorax. Post right-sided mastectomy. Surgical clips overlie the right axilla. Additional surgical clips overlie the left breast. No radiopaque foreign body. IMPRESSION: 1. No evidence of complication following attempted Port a catheter placement. Specifically, no definite  pneumothorax. 2.  Aortic Atherosclerosis (ICD10-I70.0). Electronically Signed   By: Sandi Mariscal M.D.   On: 09/11/2016 11:30   Dg C-arm 1-60 Min-no Report  Result Date: 09/11/2016 Fluoroscopy was utilized by the requesting physician.  No radiographic interpretation.   Mm Lt Radioactive Seed Loc Mammo Guide  Result Date: 09/10/2016 CLINICAL DATA:  Patient presents for radioactive seed localization of a small left breast carcinoma. EXAM: MAMMOGRAPHIC GUIDED RADIOACTIVE SEED LOCALIZATION OF THE LEFT BREAST  COMPARISON:  Previous exam(s). FINDINGS: Patient presents for radioactive seed localization prior to surgical excision. I met with the patient and we discussed the procedure of seed localization including benefits and alternatives. We discussed the high likelihood of a successful procedure. We discussed the risks of the procedure including infection, bleeding, tissue injury and further surgery. We discussed the low dose of radioactivity involved in the procedure. Informed, written consent was given. The usual time-out protocol was performed immediately prior to the procedure. Using mammographic guidance, sterile technique, 1% lidocaine and an I-125 radioactive seed, the ribbon shaped biopsy clip in the medial left breast was localized using a medial approach. The follow-up mammogram images confirm the seed in the expected location and were marked for Dr. Dalbert Batman. Follow-up survey of the patient confirms presence of the radioactive seed. Order number of I-125 seed:  [id number]. Total activity:  7.989 millicuries  Reference Date: 08/29/2016 The patient tolerated the procedure well and was released from the [facility]. She was given instructions regarding seed removal. IMPRESSION: Radioactive seed localization of the left breast. No apparent complications. Electronically Signed   By: Lajean Manes M.D.   On: 09/10/2016 13:38   Ir Fluoro Guide Port Insertion Right  Result Date: 09/13/2016 INDICATION:  76 year old female with history of breast cancer. Previous right mastectomy. Recent left breast lumpectomy. Patient needs Port-A-Cath for additional treatment. EXAM: FLUOROSCOPIC AND ULTRASOUND GUIDED PLACEMENT OF A SUBCUTANEOUS PORT COMPARISON:  None. MEDICATIONS: Ancef 2 g; The antibiotic was administered within an appropriate time interval prior to skin puncture. ANESTHESIA/SEDATION: Versed 2.0 mg IV; Fentanyl 100 mcg IV; Moderate Sedation Time:  44 minutes The patient was continuously monitored during the procedure by the interventional radiology nurse under my direct supervision. FLUOROSCOPY TIME:  54 seconds, 2.2 mGy COMPLICATIONS: None immediate. PROCEDURE: The procedure, risks, benefits, and alternatives were explained to the patient. Questions regarding the procedure were encouraged and answered. The patient understands and consents to the procedure. Patient was placed supine on the interventional table. Ultrasound confirmed a patent right internal jugular vein. The right chest and neck were cleaned with a skin antiseptic and a sterile drape was placed. Maximal barrier sterile technique was utilized including caps, mask, sterile gowns, sterile gloves, sterile drape, hand hygiene and skin antiseptic. The right neck was anesthetized with 1% lidocaine. Small incision was made in the right neck with a blade. Micropuncture set was placed in the right internal jugular vein with ultrasound guidance. The micropuncture wire was used for measurement purposes. The right chest was anesthetized with 1% lidocaine with epinephrine. #15 blade was used to make an incision and a subcutaneous port pocket was formed. [place] was assembled. Subcutaneous tunnel was formed with a stiff tunneling device. The port catheter was brought through the subcutaneous tunnel. The port was placed in the subcutaneous pocket and sutured in place. The micropuncture set was exchanged for a peel-away sheath. The catheter was placed  through the peel-away sheath and the tip was positioned at the superior cavoatrial junction. Catheter placement was confirmed with fluoroscopy. The port was accessed and flushed with heparinized saline. The port pocket was closed using two layers of absorbable sutures and Dermabond. The vein skin site was closed using a single layer of absorbable suture and Dermabond. Sterile dressings were applied. Patient tolerated the procedure well without an immediate complication. Ultrasound and fluoroscopic images were taken and saved for this procedure. IMPRESSION: Placement of a right chest subcutaneous port device. Electronically Signed   By: Markus Daft  M.D.   On: 09/13/2016 10:38      ASSESSMENT: 76 y.o. [place] woman   (1) status post right lumpectomy and sentinel lymph node biopsy in September 2002 for multifocal breast carcinoma, triple negative.  Treated with CMF followed by radiation.   (2) Local recurrence in April 2004, status post right modified radical mastectomy with TRAM reconstruction for what proved to be a T1c N0, stage IA triple negative breast carcinoma.  Treated adjuvantly with paclitaxel and doxorubicin x4 in 2004.  Off treatment since August 2004 with no evidence of recurrence  (3) history of papillary thyroid cancer s/p thyroidectomy June 2007, s/p radioactive iodine July 2007  (4) status post left breast upper outer quadrant biopsy 08/21/2016 for a clinical T1b N0, stage IB invasive ductal carcinoma, grade 3, weakly estrogen receptor positive, progesterone receptor and HER-2 negative, with an MIB-1 of 80%.  (5) left lumpectomy and sentinel lymph node sampling 09/11/2016 found a pT1b pN0, stage IB invasive ductal carcinoma, grade 3, with negative margins.  (6) adjuvant chemotherapy to consist of carboplatin and gemcitabine given days 1 and 8 of each 21 day cycle 4 cycles  (7) adjuvant radiation to follow  (8) genetics testing pending.  PLAN: Flora now has her third occurrence  of breast cancer, this time on the left side. Of course she also had thyroid cancer 11 years ago. That and the fact that she has 3 relatives with breast cancer qualifies her for genetics testing which will be requested.  Her left-sided breast cancer is small and amenable to breast conserving surgery which is planned. She will eventually also benefit from adjuvant radiation. The real concern is regarding systemic therapy. She is not a candidate for anti-HER-2 treatment. The benefit of anti-estrogen therapy is likely to be minimal in this progesterone receptor negative very weakly estrogen receptor positive--essentially triple negative--cancer.  Accordingly the real choices between no systemic therapy and chemotherapy. Given the fact that this is a grade 3 rapidly growing tumor, although very small, I think chemotherapy would be wise.  The next complication is that she has already had cyclophosphamide doxorubicin and paclitaxel, which are the main agents used in this setting. It would not be inconceivable to repeat CMF for example. I think a better plan would be to use agents she has not had in the past specifically carboplatin and gemcitabine. She could receive these days 1 and 8 of each 21 day cycle for 4 cycles. I believe that would be adequate. I also believe she would tolerate that much better than alternatives such as cyclophosphamide and Taxotere.  We discussed this at length today. She understands her chance of losing her hair is high. She may also expect some nausea, fatigue, and low counts. On the other hand neuropathy and cardiac problems are not expected to be an issue with this combination.  She is agreeable to treatment. She is already scheduled to see Dr. Dalbert Batman tomorrow and in addition to the planned surgery he will place a port. She will then return to see me and we will plan the start of treatment after which she will receive radiation.  He has a good understanding of this plan. She  agrees to it. She knows the goal of treatment in her case is cure. She will call with any problems that may before her next visit.  Nhat Hearne C    09/15/2016   This encounter was created in error - please disregard.

## 2016-09-16 ENCOUNTER — Other Ambulatory Visit: Payer: Medicare Other

## 2016-09-16 ENCOUNTER — Encounter: Payer: Medicare Other | Admitting: Oncology

## 2016-09-16 ENCOUNTER — Ambulatory Visit: Payer: Medicare Other

## 2016-09-16 ENCOUNTER — Telehealth: Payer: Self-pay | Admitting: Oncology

## 2016-09-16 NOTE — Telephone Encounter (Signed)
lvm to inform pt of r/s appt to 8/8 at 11 am per sch msg

## 2016-09-17 NOTE — Progress Notes (Signed)
. [name]   DOB: 01/04/1941  MR#: [id number]  CSN#:[id number]  PCP: [name], MD Patient Care Team: [name], MD as PCP - General Magrinat, [name], MD as Consulting Physician (Oncology) [name], MD as Consulting Physician (General Surgery) [name], MD as Consulting Physician (Gastroenterology)  BREAST CANCER HISTORY: From the recent summary note:  I last saw [name] [name] in 2013 when she was released from follow-up from her right breast cancer recurrence in 2004. She is status post right mastectomy and adjuvant chemotherapy for that triple negative tumor. More recently, on 07/16/2016 she underwent left screening mammography at the [facility] on 07/16/2016. This found the breast density to be category B. There was a possible asymmetry in the left breast, and on 07/19/2016 she underwent left diagnostic mammography with tomography and left breast ultrasonography. This confirmed a well circumscribed oval mass at the 9:00 position of the left breast measuring approximately 0.8 cm. This was not palpable. On ultrasonography the mass measured 0.6 cm and it was located at the 9:00 radiant 4 cm from the nipple. The left axilla was sonographically benign.  On June 11 [name] [name] underwent biopsy of the left breast mass in question and this showed ([id number]) and invasive ductal carcinoma, grade 3, estrogen receptor 5% positive with weak staining intensity, progesterone receptor negative, with an MIB-1 of 80%, and HER-2 nonamplified, the signals ratio being 1.46 and the number per cell 1.90.  Her subsequent history is as detailed below.   INTERVAL HISTORY: [name] [name] returns today for follow-up and treatment of what is essentially a triple negative breast cancer. She underwent left lumpectomy and sentinel lymph node sampling 09/11/2016. The final pathology ([id number]) showed a 0.8 cm invasive ductal carcinoma, grade 3, with negative margins. All 3 sentinel lymph nodes  were clear. The prognostic panel was not repeated.  On 09/13/2016 she had a port placed with no immediate complications.  She is here today for day 1 cycle 1 of cyclophosphamide and gemcitabine to be given days 1 and 8 of each 21 day cycle, repeated for 4 cycles.    REVIEW OF SYSTEMS: A detailed ROS was conducted and is non contributory except what is noted above.  PAST MEDICAL HISTORY: Past Medical History:  Diagnosis Date  . Allergic rhinitis   . Anxiety   . Breast cancer ([place]) hx 2004   recurrent 2006 DR Magrinat  . HTN (hypertension)   . Hyperlipidemia   . Hypothyroidism   . Personal history of chemotherapy 2002  . Personal history of radiation therapy 2002  . Psoriasis   . S/P thyroidectomy 07/2005   2 cm largest diameter (1 other tiny focus)/ i-131 rx 99 mci 08/2005  . Thyroid cancer ([place])    Papillary Stage 1 - Dr Loanne Drilling  . Vitamin B12 deficiency 2009  . Vitamin D deficiency 2009    PAST SURGICAL HISTORY: Past Surgical History:  Procedure Laterality Date  . BREAST LUMPECTOMY    . BREAST LUMPECTOMY WITH RADIOACTIVE SEED AND SENTINEL LYMPH NODE BIOPSY Left 09/11/2016   Procedure: LEFT BREAST LUMPECTOMY WITH RADIOACTIVE SEED AND LEFT AXILLARY SENTINEL LYMPH NODE BIOPSY WITH BLUE DYE INJECTION;  Surgeon: [name], MD;  Location: [facility];  Service: General;  Laterality: Left;  . IR FLUORO GUIDE PORT INSERTION RIGHT  09/13/2016  . IR US GUIDE VASC ACCESS RIGHT  09/13/2016  . MASTECTOMY     Right  . PORTACATH PLACEMENT N/A 09/11/2016   Procedure: ATTEMPTED INSERTION PORT-A-CATH  WITH ULTRA SOUND GUIDANCE;  Surgeon: [name], MD;  Location: [facility];  Service: General;  Laterality: N/A;  . REDUCTION MAMMAPLASTY Left 2005  . THYROIDECTOMY  2007    FAMILY HISTORY Family History  Problem Relation Age of Onset  . Stroke Mother   . Allergies Mother   . Asthma Mother   . Clotting disorder Mother   . Heart disease Father 60        MI  . Allergies Sister   . Asthma Sister   . Cancer Sister        Breast  . Asthma Brother   . Allergies Daughter   . Asthma Daughter   . Allergies Sister   Family history includes a sister, a paternal aunt, and a paternal cousin with breast cancer all older then 70 at diagnosis  GYN HISTORY: Menarche age 56, first live birth age 1. She is GX P3. She went through the change of life age 50. She did not take hormone replacement.  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 Her husband [name] [name] is retired from [employer]. Her children are [name] [name], who works for time [employer] in [place], and [name] [name], who lives in [place] and works for [employer]. The third child, [name] [name], died in an automobile accident at age 50. The patient has one grandchild. 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 Smoking status: Never Smoker  . Smokeless tobacco: Never Used  . Alcohol use No     Colonoscopy:  PAP:  Bone density:  Lipid panel:  Allergies  Allergen Reactions  . Pneumococcal Vaccines Other (See Comments)    Was really sick   . Sulfa Antibiotics Other (See Comments)    Pt believes it was hives  . Sulfacetamide Sodium-Sulfur   . Tape Itching, Dermatitis and Rash  . Tegaderm Ag Mesh [Silver] Itching and Rash    Current Outpatient Prescriptions  Medication Sig Dispense Refill  . ALPRAZolam (XANAX) 0.25 MG tablet Take 0.125-0.25 mg by mouth 2 (two) times daily as needed for anxiety or sleep (Pt takes a half of a tablet every evening).     . cholecalciferol (VITAMIN D) 1000 units tablet Take 1,000 Units by mouth daily.    . cyanocobalamin (,VITAMIN B-12,) 1000 MCG/ML injection 1 ml q 2 wks (Patient taking differently: Inject 1,000 mcg into the muscle See admin instructions. Every 60 days) 10 mL 6  . dexamethasone (DECADRON) 4 MG tablet Take 2 tablets (8 mg total) by mouth  daily. Start the day after chemotherapy for 2 days. Take with food. 30 tablet 1  . DULoxetine (CYMBALTA) 60 MG capsule Take 60 mg by mouth every morning.     [name] HYDROcodone-acetaminophen (NORCO) 5-325 MG tablet Take 1-2 tablets by mouth every 6 (six) hours as needed for moderate pain or severe pain. 30 tablet 0  . levothyroxine (SYNTHROID, LEVOTHROID) 112 MCG tablet TAKE 1 TABLET (112 MCG TOTAL) BY MOUTH DAILY. 90 tablet 3  . lidocaine-prilocaine (EMLA) cream Apply to affected area once 30 g 3  . losartan (COZAAR) 100 MG tablet TAKE 1 TABLET BY MOUTH EVERY DAY FOR BLOOD PRESSURE 90 tablet 1  . prochlorperazine (COMPAZINE) 10 MG tablet Take 1 tablet (10 mg total) by mouth every 6 (six) hours as needed (Nausea or vomiting). 30 tablet 1  . rosuvastatin (CRESTOR) 20 MG tablet Take 1 tablet (20  mg total) by mouth daily. 90 tablet 2  . Syringe/Needle, Disp, (B-D ECLIPSE SYRINGE) 30G X 1/2" 1 ML MISC 1 each by Does not apply route 1 day or 1 dose. For Vit B12 inj 50 each 11  . vitamin B-12 (CYANOCOBALAMIN) 1000 MCG tablet Take 1,000 mcg by mouth every other day.     No current facility-administered medications for this visit.    Facility-Administered Medications Ordered in Other Visits  Medication Dose Route Frequency Provider Last Rate Last Dose  . CARBOplatin (PARAPLATIN) 150 mg in sodium chloride 0.9 % 250 mL chemo infusion  150 mg Intravenous Once Magrinat, [name], MD      . dexamethasone (DECADRON) injection 10 mg  10 mg Intravenous Once Magrinat, [name], MD      . gemcitabine (GEMZAR) 1,400 mg in sodium chloride 0.9 % 250 mL chemo infusion  1,400 mg Intravenous Once Magrinat, [name], MD      . heparin lock flush 100 unit/mL  500 Units Intracatheter Once PRN Magrinat, [name], MD      . sodium chloride flush (NS) 0.9 % injection 10 mL  10 mL Intracatheter PRN Magrinat, [name], MD        OBJECTIVE:   Vitals:   09/18/16 1122  BP: (!) 150/98  Pulse: 87  Resp: 19  Temp: 98 F (36.7 C)      Body mass index is 24.32 kg/m.    ECOG FS: 0 GENERAL: Patient is a well appearing female in no acute distress HEENT:  Sclerae anicteric.  Oropharynx clear and moist. No ulcerations or evidence of oropharyngeal candidiasis. Neck is supple.  NODES:  No cervical, supraclavicular, or axillary lymphadenopathy palpated.  BREAST EXAM:  Deferred. LUNGS:  Clear to auscultation bilaterally.  No wheezes or rhonchi. HEART:  Regular rate and rhythm. No murmur appreciated. ABDOMEN:  Soft, nontender.  Positive, normoactive bowel sounds. No organomegaly palpated. MSK:  No focal spinal tenderness to palpation. Full range of motion bilaterally in the upper extremities. EXTREMITIES:  No peripheral edema.   SKIN:  Clear with no obvious rashes or skin changes. No nail dyscrasia. NEURO:  Nonfocal. Well oriented.  Appropriate affect.    LAB RESULTS: Lab Results  Component Value Date   WBC 5.4 09/18/2016   NEUTROABS 2.7 09/18/2016   HGB 13.3 09/18/2016   HCT 39.3 09/18/2016   MCV 90.6 09/18/2016   PLT 231 09/18/2016      Chemistry      Component Value Date/Time   NA 140 09/18/2016 1108   K 3.6 09/18/2016 1108   CL 108 09/13/2016 0728   CL 104 10/09/2011 1403   CO2 23 09/18/2016 1108   BUN 19.5 09/18/2016 1108   CREATININE 0.9 09/18/2016 1108      Component Value Date/Time   CALCIUM 9.8 09/18/2016 1108   ALKPHOS 113 09/18/2016 1108   AST 20 09/18/2016 1108   ALT 13 09/18/2016 1108   BILITOT 0.91 09/18/2016 1108       Lab Results  Component Value Date   LABCA2 16 10/09/2011    No components found for: LABCA125   Recent Labs Lab 09/13/16 0728  INR 0.89    Urinalysis    Component Value Date/Time   COLORURINE YELLOW 06/05/2015 1559   APPEARANCEUR CLEAR 06/05/2015 1559   LABSPEC 1.020 06/05/2015 1559   PHURINE 5.5 06/05/2015 1559   GLUCOSEU NEGATIVE 06/05/2015 1559   HGBUR NEGATIVE 06/05/2015 1559   BILIRUBINUR NEGATIVE 06/05/2015 1559   KETONESUR NEGATIVE 06/05/2015  1559   UROBILINOGEN 0.2 06/05/2015 1559   NITRITE NEGATIVE 06/05/2015 1559   LEUKOCYTESUR MODERATE (A) 06/05/2015 1559    STUDIES: Mm Breast Surgical Specimen  Result Date: 09/11/2016 CLINICAL DATA:  Status post left breast lumpectomy today after earlier radioactive seed localization. EXAM: SPECIMEN RADIOGRAPH OF THE LEFT BREAST COMPARISON:  Previous exam(s). FINDINGS: Status post excision of the left breast. The radioactive seed and biopsy marker clip are present, completely intact, and were marked for pathology. The positions of the radioactive seed and biopsy marker clip within the specimen were discussed with the OR staff during the procedure. IMPRESSION: Specimen radiograph of the left breast. Electronically Signed   By: Franki Cabot M.D.   On: 09/11/2016 10:01   Ir US Guide Vasc Access Right  Result Date: 09/13/2016 INDICATION: 76 year old female with history of breast cancer. Previous right mastectomy. Recent left breast lumpectomy. Patient needs Port-A-Cath for additional treatment. EXAM: FLUOROSCOPIC AND ULTRASOUND GUIDED PLACEMENT OF A SUBCUTANEOUS PORT COMPARISON:  None. MEDICATIONS: Ancef 2 g; The antibiotic was administered within an appropriate time interval prior to skin puncture. ANESTHESIA/SEDATION: Versed 2.0 mg IV; Fentanyl 100 mcg IV; Moderate Sedation Time:  44 minutes The patient was continuously monitored during the procedure by the interventional radiology nurse under my direct supervision. FLUOROSCOPY TIME:  54 seconds, 2.2 mGy COMPLICATIONS: None immediate. PROCEDURE: The procedure, risks, benefits, and alternatives were explained to the patient. Questions regarding the procedure were encouraged and answered. The patient understands and consents to the procedure. Patient was placed supine on the interventional table. Ultrasound confirmed a patent right internal jugular vein. The right chest and neck were cleaned with a skin antiseptic and a sterile drape was placed. Maximal  barrier sterile technique was utilized including caps, mask, sterile gowns, sterile gloves, sterile drape, hand hygiene and skin antiseptic. The right neck was anesthetized with 1% lidocaine. Small incision was made in the right neck with a blade. Micropuncture set was placed in the right internal jugular vein with ultrasound guidance. The micropuncture wire was used for measurement purposes. The right chest was anesthetized with 1% lidocaine with epinephrine. #15 blade was used to make an incision and a subcutaneous port pocket was formed. [place] was assembled. Subcutaneous tunnel was formed with a stiff tunneling device. The port catheter was brought through the subcutaneous tunnel. The port was placed in the subcutaneous pocket and sutured in place. The micropuncture set was exchanged for a peel-away sheath. The catheter was placed through the peel-away sheath and the tip was positioned at the superior cavoatrial junction. Catheter placement was confirmed with fluoroscopy. The port was accessed and flushed with heparinized saline. The port pocket was closed using two layers of absorbable sutures and Dermabond. The vein skin site was closed using a single layer of absorbable suture and Dermabond. Sterile dressings were applied. Patient tolerated the procedure well without an immediate complication. Ultrasound and fluoroscopic images were taken and saved for this procedure. IMPRESSION: Placement of a right chest subcutaneous port device. Electronically Signed   By: Markus Daft M.D.   On: 09/13/2016 10:38   Dg Chest Port 1 View  Result Date: 09/11/2016 CLINICAL DATA:  Failed attempted Port a catheter placement. Evaluate for pneumothorax. EXAM: PORTABLE CHEST 1 VIEW COMPARISON:  05/17/2008 ; 11/19/2007 FINDINGS: Normal cardiac silhouette and mediastinal contours with atherosclerotic plaque within the thoracic aorta. There is mild elevation of the right hemidiaphragm. One venous congestion without frank  evidence of edema. Minimal bilateral infrahilar opacities favored to represent  atelectasis. No pleural effusion or pneumothorax. Post right-sided mastectomy. Surgical clips overlie the right axilla. Additional surgical clips overlie the left breast. No radiopaque foreign body. IMPRESSION: 1. No evidence of complication following attempted Port a catheter placement. Specifically, no definite pneumothorax. 2.  Aortic Atherosclerosis (ICD10-I70.0). Electronically Signed   By: Sandi Mariscal M.D.   On: 09/11/2016 11:30   Dg C-arm 1-60 Min-no Report  Result Date: 09/11/2016 Fluoroscopy was utilized by the requesting physician.  No radiographic interpretation.   Mm Lt Radioactive Seed Loc Mammo Guide  Result Date: 09/10/2016 CLINICAL DATA:  Patient presents for radioactive seed localization of a small left breast carcinoma. EXAM: MAMMOGRAPHIC GUIDED RADIOACTIVE SEED LOCALIZATION OF THE LEFT BREAST COMPARISON:  Previous exam(s). FINDINGS: Patient presents for radioactive seed localization prior to surgical excision. I met with the patient and we discussed the procedure of seed localization including benefits and alternatives. We discussed the high likelihood of a successful procedure. We discussed the risks of the procedure including infection, bleeding, tissue injury and further surgery. We discussed the low dose of radioactivity involved in the procedure. Informed, written consent was given. The usual time-out protocol was performed immediately prior to the procedure. Using mammographic guidance, sterile technique, 1% lidocaine and an I-125 radioactive seed, the ribbon shaped biopsy clip in the medial left breast was localized using a medial approach. The follow-up mammogram images confirm the seed in the expected location and were marked for Dr. Dalbert Batman. Follow-up survey of the patient confirms presence of the radioactive seed. Order number of I-125 seed:  [id number]. Total activity:  1.017 millicuries  Reference  Date: 08/29/2016 The patient tolerated the procedure well and was released from the [facility]. She was given instructions regarding seed removal. IMPRESSION: Radioactive seed localization of the left breast. No apparent complications. Electronically Signed   By: Lajean Manes M.D.   On: 09/10/2016 13:38   Ir Fluoro Guide Port Insertion Right  Result Date: 09/13/2016 INDICATION: 76 year old female with history of breast cancer. Previous right mastectomy. Recent left breast lumpectomy. Patient needs Port-A-Cath for additional treatment. EXAM: FLUOROSCOPIC AND ULTRASOUND GUIDED PLACEMENT OF A SUBCUTANEOUS PORT COMPARISON:  None. MEDICATIONS: Ancef 2 g; The antibiotic was administered within an appropriate time interval prior to skin puncture. ANESTHESIA/SEDATION: Versed 2.0 mg IV; Fentanyl 100 mcg IV; Moderate Sedation Time:  44 minutes The patient was continuously monitored during the procedure by the interventional radiology nurse under my direct supervision. FLUOROSCOPY TIME:  54 seconds, 2.2 mGy COMPLICATIONS: None immediate. PROCEDURE: The procedure, risks, benefits, and alternatives were explained to the patient. Questions regarding the procedure were encouraged and answered. The patient understands and consents to the procedure. Patient was placed supine on the interventional table. Ultrasound confirmed a patent right internal jugular vein. The right chest and neck were cleaned with a skin antiseptic and a sterile drape was placed. Maximal barrier sterile technique was utilized including caps, mask, sterile gowns, sterile gloves, sterile drape, hand hygiene and skin antiseptic. The right neck was anesthetized with 1% lidocaine. Small incision was made in the right neck with a blade. Micropuncture set was placed in the right internal jugular vein with ultrasound guidance. The micropuncture wire was used for measurement purposes. The right chest was anesthetized with 1% lidocaine with epinephrine. #15 blade  was used to make an incision and a subcutaneous port pocket was formed. [place] was assembled. Subcutaneous tunnel was formed with a stiff tunneling device. The port catheter was brought through the subcutaneous tunnel. The port  was placed in the subcutaneous pocket and sutured in place. The micropuncture set was exchanged for a peel-away sheath. The catheter was placed through the peel-away sheath and the tip was positioned at the superior cavoatrial junction. Catheter placement was confirmed with fluoroscopy. The port was accessed and flushed with heparinized saline. The port pocket was closed using two layers of absorbable sutures and Dermabond. The vein skin site was closed using a single layer of absorbable suture and Dermabond. Sterile dressings were applied. Patient tolerated the procedure well without an immediate complication. Ultrasound and fluoroscopic images were taken and saved for this procedure. IMPRESSION: Placement of a right chest subcutaneous port device. Electronically Signed   By: Markus Daft M.D.   On: 09/13/2016 10:38      ASSESSMENT: 76 y.o. [place] woman   (1) status post right lumpectomy and sentinel lymph node biopsy in September 2002 for multifocal breast carcinoma, triple negative.  Treated with CMF followed by radiation.   (2) Local recurrence in April 2004, status post right modified radical mastectomy with TRAM reconstruction for what proved to be a T1c N0, stage IA triple negative breast carcinoma.  Treated adjuvantly with paclitaxel and doxorubicin x4 in 2004.  Off treatment since August 2004 with no evidence of recurrence  (3) history of papillary thyroid cancer s/p thyroidectomy June 2007, s/p radioactive iodine July 2007  (4) status post left breast upper outer quadrant biopsy 08/21/2016 for a clinical T1b N0, stage IB invasive ductal carcinoma, grade 3, weakly estrogen receptor positive, progesterone receptor and HER-2 negative, with an MIB-1 of  80%.  (5) left lumpectomy and sentinel lymph node sampling 09/11/2016 found a pT1b pN0, stage IB invasive ductal carcinoma, grade 3, with negative margins.  (6) adjuvant chemotherapy to consist of carboplatin and gemcitabine given days 1 and 8 of each 21 day cycle 4 cycles  (7) adjuvant radiation to follow  (8) genetics testing pending.  PLAN:   [name] [name] is doing well today. She has tolerated her surgery and port placement well.  I reviewed her pathology with her in detail.  I also gave her a copy.  I reviewed her chemotherapy regimen with her.  She was unaware that her anti-emetics were sent into her pharmacy.  We called [facility] and they do have them ready for her.  I reviewed with her how to take him.  I had her tell me what she understands about her cancer and her treatment so far, and she verbalized her treatment plan, and the rationale behind it.  She will return in one week for labs, and an appointment with Dr. Jana Hakim, and day 8 of Gemcitabine, Carboplatin.    She knows to call for any questions or concerns prior to her next appointment with [place].    A total of (30) minutes of face-to-face time was spent with this patient with greater than 50% of that time in counseling and care-coordination.   Scot Dock    09/18/2016

## 2016-09-18 ENCOUNTER — Other Ambulatory Visit: Payer: Self-pay | Admitting: Emergency Medicine

## 2016-09-18 ENCOUNTER — Ambulatory Visit (HOSPITAL_BASED_OUTPATIENT_CLINIC_OR_DEPARTMENT_OTHER): Payer: Medicare Other | Admitting: Adult Health

## 2016-09-18 ENCOUNTER — Encounter: Payer: Self-pay | Admitting: Adult Health

## 2016-09-18 ENCOUNTER — Other Ambulatory Visit: Payer: Self-pay | Admitting: Oncology

## 2016-09-18 ENCOUNTER — Other Ambulatory Visit (HOSPITAL_BASED_OUTPATIENT_CLINIC_OR_DEPARTMENT_OTHER): Payer: Medicare Other

## 2016-09-18 ENCOUNTER — Ambulatory Visit (HOSPITAL_BASED_OUTPATIENT_CLINIC_OR_DEPARTMENT_OTHER): Payer: Medicare Other

## 2016-09-18 VITALS — BP 150/98 | HR 87 | Temp 98.0°F | Resp 19 | Ht 64.0 in | Wt 141.7 lb

## 2016-09-18 DIAGNOSIS — Z170 Estrogen receptor positive status [ER+]: Secondary | ICD-10-CM

## 2016-09-18 DIAGNOSIS — Z853 Personal history of malignant neoplasm of breast: Secondary | ICD-10-CM

## 2016-09-18 DIAGNOSIS — C50012 Malignant neoplasm of nipple and areola, left female breast: Secondary | ICD-10-CM

## 2016-09-18 DIAGNOSIS — C50112 Malignant neoplasm of central portion of left female breast: Secondary | ICD-10-CM

## 2016-09-18 DIAGNOSIS — Z5111 Encounter for antineoplastic chemotherapy: Secondary | ICD-10-CM | POA: Diagnosis present

## 2016-09-18 DIAGNOSIS — Z85850 Personal history of malignant neoplasm of thyroid: Secondary | ICD-10-CM | POA: Diagnosis not present

## 2016-09-18 DIAGNOSIS — C50412 Malignant neoplasm of upper-outer quadrant of left female breast: Secondary | ICD-10-CM

## 2016-09-18 LAB — COMPREHENSIVE METABOLIC PANEL
ALT: 13 U/L (ref 0–55)
AST: 20 U/L (ref 5–34)
Albumin: 3.6 g/dL (ref 3.5–5.0)
Alkaline Phosphatase: 113 U/L (ref 40–150)
Anion Gap: 10 mEq/L (ref 3–11)
BUN: 19.5 mg/dL (ref 7.0–26.0)
CHLORIDE: 107 meq/L (ref 98–109)
CO2: 23 meq/L (ref 22–29)
Calcium: 9.8 mg/dL (ref 8.4–10.4)
Creatinine: 0.9 mg/dL (ref 0.6–1.1)
EGFR: 64 mL/min/{1.73_m2} — ABNORMAL LOW (ref >90)
GLUCOSE: 116 mg/dL (ref 70–140)
POTASSIUM: 3.6 meq/L (ref 3.5–5.1)
SODIUM: 140 meq/L (ref 136–145)
Total Bilirubin: 0.91 mg/dL (ref 0.20–1.20)
Total Protein: 7.2 g/dL (ref 6.4–8.3)

## 2016-09-18 LAB — CBC WITH DIFFERENTIAL/PLATELET
BASO%: 0.2 % (ref 0.0–2.0)
BASOS ABS: 0 10*3/uL (ref 0.0–0.1)
EOS%: 3.7 % (ref 0.0–7.0)
Eosinophils Absolute: 0.2 10*3/uL (ref 0.0–0.5)
HCT: 39.3 % (ref 34.8–46.6)
HEMOGLOBIN: 13.3 g/dL (ref 11.6–15.9)
LYMPH%: 37 % (ref 14.0–49.7)
MCH: 30.6 pg (ref 25.1–34.0)
MCHC: 33.8 g/dL (ref 31.5–36.0)
MCV: 90.6 fL (ref 79.5–101.0)
MONO#: 0.6 10*3/uL (ref 0.1–0.9)
MONO%: 10.2 % (ref 0.0–14.0)
NEUT#: 2.7 10*3/uL (ref 1.5–6.5)
NEUT%: 48.9 % (ref 38.4–76.8)
Platelets: 231 10*3/uL (ref 145–400)
RBC: 4.34 10*6/uL (ref 3.70–5.45)
RDW: 13.4 % (ref 11.2–14.5)
WBC: 5.4 10*3/uL (ref 3.9–10.3)
lymph#: 2 10*3/uL (ref 0.9–3.3)

## 2016-09-18 MED ORDER — DEXAMETHASONE SODIUM PHOSPHATE 10 MG/ML IJ SOLN
10.0000 mg | Freq: Once | INTRAMUSCULAR | Status: AC
  Administered 2016-09-18: 10 mg via INTRAVENOUS

## 2016-09-18 MED ORDER — PALONOSETRON HCL INJECTION 0.25 MG/5ML
INTRAVENOUS | Status: AC
  Filled 2016-09-18: qty 5

## 2016-09-18 MED ORDER — SODIUM CHLORIDE 0.9 % IV SOLN
Freq: Once | INTRAVENOUS | Status: AC
  Administered 2016-09-18: 13:00:00 via INTRAVENOUS

## 2016-09-18 MED ORDER — SODIUM CHLORIDE 0.9 % IV SOLN
146.4000 mg | Freq: Once | INTRAVENOUS | Status: AC
  Administered 2016-09-18: 150 mg via INTRAVENOUS
  Filled 2016-09-18: qty 15

## 2016-09-18 MED ORDER — PALONOSETRON HCL INJECTION 0.25 MG/5ML
0.2500 mg | Freq: Once | INTRAVENOUS | Status: AC
  Administered 2016-09-18: 0.25 mg via INTRAVENOUS

## 2016-09-18 MED ORDER — DEXAMETHASONE SODIUM PHOSPHATE 10 MG/ML IJ SOLN
INTRAMUSCULAR | Status: AC
  Filled 2016-09-18: qty 1

## 2016-09-18 MED ORDER — HEPARIN SOD (PORK) LOCK FLUSH 100 UNIT/ML IV SOLN
500.0000 [IU] | Freq: Once | INTRAVENOUS | Status: DC | PRN
  Filled 2016-09-18: qty 5

## 2016-09-18 MED ORDER — SODIUM CHLORIDE 0.9% FLUSH
10.0000 mL | INTRAVENOUS | Status: DC | PRN
Start: 2016-09-18 — End: 2016-09-18
  Filled 2016-09-18: qty 10

## 2016-09-18 MED ORDER — GEMCITABINE HCL CHEMO INJECTION 1 GM/26.3ML
1400.0000 mg | Freq: Once | INTRAVENOUS | Status: AC
  Administered 2016-09-18: 1400 mg via INTRAVENOUS
  Filled 2016-09-18: qty 36.82

## 2016-09-25 ENCOUNTER — Ambulatory Visit (HOSPITAL_BASED_OUTPATIENT_CLINIC_OR_DEPARTMENT_OTHER): Payer: Medicare Other

## 2016-09-25 ENCOUNTER — Ambulatory Visit (HOSPITAL_BASED_OUTPATIENT_CLINIC_OR_DEPARTMENT_OTHER): Payer: Medicare Other | Admitting: Oncology

## 2016-09-25 DIAGNOSIS — Z85850 Personal history of malignant neoplasm of thyroid: Secondary | ICD-10-CM | POA: Diagnosis not present

## 2016-09-25 DIAGNOSIS — C50012 Malignant neoplasm of nipple and areola, left female breast: Secondary | ICD-10-CM

## 2016-09-25 DIAGNOSIS — Z5111 Encounter for antineoplastic chemotherapy: Secondary | ICD-10-CM | POA: Diagnosis not present

## 2016-09-25 DIAGNOSIS — Z170 Estrogen receptor positive status [ER+]: Secondary | ICD-10-CM | POA: Diagnosis not present

## 2016-09-25 DIAGNOSIS — C50412 Malignant neoplasm of upper-outer quadrant of left female breast: Secondary | ICD-10-CM

## 2016-09-25 DIAGNOSIS — Z853 Personal history of malignant neoplasm of breast: Secondary | ICD-10-CM

## 2016-09-25 DIAGNOSIS — G4700 Insomnia, unspecified: Secondary | ICD-10-CM | POA: Diagnosis not present

## 2016-09-25 LAB — COMPREHENSIVE METABOLIC PANEL
ALT: 19 U/L (ref 0–55)
ANION GAP: 8 meq/L (ref 3–11)
AST: 18 U/L (ref 5–34)
Albumin: 3.5 g/dL (ref 3.5–5.0)
Alkaline Phosphatase: 106 U/L (ref 40–150)
BILIRUBIN TOTAL: 0.67 mg/dL (ref 0.20–1.20)
BUN: 19.9 mg/dL (ref 7.0–26.0)
CHLORIDE: 103 meq/L (ref 98–109)
CO2: 26 mEq/L (ref 22–29)
Calcium: 9.6 mg/dL (ref 8.4–10.4)
Creatinine: 1 mg/dL (ref 0.6–1.1)
EGFR: 55 mL/min/{1.73_m2} — AB (ref >90)
Glucose: 109 mg/dl (ref 70–140)
POTASSIUM: 3.9 meq/L (ref 3.5–5.1)
SODIUM: 137 meq/L (ref 136–145)
Total Protein: 6.8 g/dL (ref 6.4–8.3)

## 2016-09-25 LAB — CBC WITH DIFFERENTIAL/PLATELET
BASO%: 0.3 % (ref 0.0–2.0)
Basophils Absolute: 0 10*3/uL (ref 0.0–0.1)
EOS ABS: 0.1 10*3/uL (ref 0.0–0.5)
EOS%: 1.7 % (ref 0.0–7.0)
HCT: 37.1 % (ref 34.8–46.6)
HGB: 12.6 g/dL (ref 11.6–15.9)
LYMPH%: 42.6 % (ref 14.0–49.7)
MCH: 30.8 pg (ref 25.1–34.0)
MCHC: 34 g/dL (ref 31.5–36.0)
MCV: 90.5 fL (ref 79.5–101.0)
MONO#: 0.2 10*3/uL (ref 0.1–0.9)
MONO%: 3.6 % (ref 0.0–14.0)
NEUT#: 2.2 10*3/uL (ref 1.5–6.5)
NEUT%: 51.8 % (ref 38.4–76.8)
PLATELETS: 173 10*3/uL (ref 145–400)
RBC: 4.1 10*6/uL (ref 3.70–5.45)
RDW: 13.4 % (ref 11.2–14.5)
WBC: 4.3 10*3/uL (ref 3.9–10.3)
lymph#: 1.8 10*3/uL (ref 0.9–3.3)

## 2016-09-25 MED ORDER — SODIUM CHLORIDE 0.9% FLUSH
10.0000 mL | INTRAVENOUS | Status: DC | PRN
  Administered 2016-09-25: 10 mL
  Filled 2016-09-25: qty 10

## 2016-09-25 MED ORDER — DEXAMETHASONE 4 MG PO TABS: ORAL_TABLET | ORAL | 1 refills | Status: DC

## 2016-09-25 MED ORDER — SODIUM CHLORIDE 0.9 % IV SOLN
146.4000 mg | Freq: Once | INTRAVENOUS | Status: AC
  Administered 2016-09-25: 150 mg via INTRAVENOUS
  Filled 2016-09-25: qty 15

## 2016-09-25 MED ORDER — PALONOSETRON HCL INJECTION 0.25 MG/5ML
INTRAVENOUS | Status: AC
  Filled 2016-09-25: qty 5

## 2016-09-25 MED ORDER — PALONOSETRON HCL INJECTION 0.25 MG/5ML
0.2500 mg | Freq: Once | INTRAVENOUS | Status: AC
  Administered 2016-09-25: 0.25 mg via INTRAVENOUS

## 2016-09-25 MED ORDER — PROCHLORPERAZINE MALEATE 10 MG PO TABS: ORAL_TABLET | ORAL | 1 refills | Status: DC

## 2016-09-25 MED ORDER — DEXAMETHASONE SODIUM PHOSPHATE 10 MG/ML IJ SOLN
INTRAMUSCULAR | Status: AC
  Filled 2016-09-25: qty 1

## 2016-09-25 MED ORDER — DEXAMETHASONE SODIUM PHOSPHATE 10 MG/ML IJ SOLN
4.0000 mg | Freq: Once | INTRAMUSCULAR | Status: AC
  Administered 2016-09-25: 4 mg via INTRAVENOUS

## 2016-09-25 MED ORDER — SODIUM CHLORIDE 0.9 % IV SOLN
Freq: Once | INTRAVENOUS | Status: AC
  Administered 2016-09-25: 13:00:00 via INTRAVENOUS

## 2016-09-25 MED ORDER — HEPARIN SOD (PORK) LOCK FLUSH 100 UNIT/ML IV SOLN
500.0000 [IU] | Freq: Once | INTRAVENOUS | Status: AC | PRN
  Administered 2016-09-25: 500 [IU]
  Filled 2016-09-25: qty 5

## 2016-09-25 MED ORDER — SODIUM CHLORIDE 0.9 % IV SOLN
1400.0000 mg | Freq: Once | INTRAVENOUS | Status: AC
  Administered 2016-09-25: 1400 mg via INTRAVENOUS
  Filled 2016-09-25: qty 36.82

## 2016-09-25 NOTE — Patient Instructions (Signed)
[facility] Discharge Instructions for Patients Receiving Chemotherapy  Today you received the following chemotherapy agents Carboplatin/ Gemzar  To help prevent nausea and vomiting after your treatment, we encourage you to take your nausea medication as directed by your MD.   If you develop nausea and vomiting that is not controlled by your nausea medication, call the clinic.   BELOW ARE SYMPTOMS THAT SHOULD BE REPORTED IMMEDIATELY:  *FEVER GREATER THAN 100.5 F  *CHILLS WITH OR WITHOUT FEVER  NAUSEA AND VOMITING THAT IS NOT CONTROLLED WITH YOUR NAUSEA MEDICATION  *UNUSUAL SHORTNESS OF BREATH  *UNUSUAL BRUISING OR BLEEDING  TENDERNESS IN MOUTH AND THROAT WITH OR WITHOUT PRESENCE OF ULCERS  *URINARY PROBLEMS  *BOWEL PROBLEMS  UNUSUAL RASH Items with * indicate a potential emergency and should be followed up as soon as possible.  Feel free to call the clinic you have any questions or concerns. The clinic phone number is [phone].  Please show the CHEMO ALERT CARD at check-in to the Emergency Department and triage nurse.  Carboplatin injection What is this medicine? CARBOPLATIN (KAR boe pla tin) is a chemotherapy drug. It targets fast dividing cells, like cancer cells, and causes these cells to die. This medicine is used to treat ovarian cancer and many other cancers. This medicine may be used for other purposes; ask your health care provider or pharmacist if you have questions. COMMON BRAND NAME(S): Paraplatin What should I tell my health care provider before I take this medicine? They need to know if you have any of these conditions: -blood disorders -hearing problems -kidney disease -recent or ongoing radiation therapy -an unusual or allergic reaction to carboplatin, cisplatin, other chemotherapy, other medicines, foods, dyes, or preservatives -pregnant or trying to get pregnant -breast-feeding How should I use this medicine? This drug is usually  given as an infusion into a vein. It is administered in a hospital or clinic by a specially trained health care professional. Talk to your pediatrician regarding the use of this medicine in children. Special care may be needed. Overdosage: If you think you have taken too much of this medicine contact a poison control center or emergency room at once. NOTE: This medicine is only for you. Do not share this medicine with others. What if I miss a dose? It is important not to miss a dose. Call your doctor or health care professional if you are unable to keep an appointment. What may interact with this medicine? -medicines for seizures -medicines to increase blood counts like filgrastim, pegfilgrastim, sargramostim -some antibiotics like amikacin, gentamicin, neomycin, streptomycin, tobramycin -vaccines Talk to your doctor or health care professional before taking any of these medicines: -acetaminophen -aspirin -ibuprofen -ketoprofen -naproxen This list may not describe all possible interactions. Give your health care provider a list of all the medicines, herbs, non-prescription drugs, or dietary supplements you use. Also tell them if you smoke, drink alcohol, or use illegal drugs. Some items may interact with your medicine. What should I watch for while using this medicine? Your condition will be monitored carefully while you are receiving this medicine. You will need important blood work done while you are taking this medicine. This drug may make you feel generally unwell. This is not uncommon, as chemotherapy can affect healthy cells as well as cancer cells. Report any side effects. Continue your course of treatment even though you feel ill unless your doctor tells you to stop. In some cases, you may be given additional medicines to help with side   effects. Follow all directions for their use. Call your doctor or health care professional for advice if you get a fever, chills or sore throat, or  other symptoms of a cold or flu. Do not treat yourself. This drug decreases your body's ability to fight infections. Try to avoid being around people who are sick. This medicine may increase your risk to bruise or bleed. Call your doctor or health care professional if you notice any unusual bleeding. Be careful brushing and flossing your teeth or using a toothpick because you may get an infection or bleed more easily. If you have any dental work done, tell your dentist you are receiving this medicine. Avoid taking products that contain aspirin, acetaminophen, ibuprofen, naproxen, or ketoprofen unless instructed by your doctor. These medicines may hide a fever. Do not become pregnant while taking this medicine. Women should inform their doctor if they wish to become pregnant or think they might be pregnant. There is a potential for serious side effects to an unborn child. Talk to your health care professional or pharmacist for more information. Do not breast-feed an infant while taking this medicine. What side effects may I notice from receiving this medicine? Side effects that you should report to your doctor or health care professional as soon as possible: -allergic reactions like skin rash, itching or hives, swelling of the face, lips, or tongue -signs of infection - fever or chills, cough, sore throat, pain or difficulty passing urine -signs of decreased platelets or bleeding - bruising, pinpoint red spots on the skin, black, tarry stools, nosebleeds -signs of decreased red blood cells - unusually weak or tired, fainting spells, lightheadedness -breathing problems -changes in hearing -changes in vision -chest pain -high blood pressure -low blood counts - This drug may decrease the number of white blood cells, red blood cells and platelets. You may be at increased risk for infections and bleeding. -nausea and vomiting -pain, swelling, redness or irritation at the injection site -pain, tingling,  numbness in the hands or feet -problems with balance, talking, walking -trouble passing urine or change in the amount of urine Side effects that usually do not require medical attention (report to your doctor or health care professional if they continue or are bothersome): -hair loss -loss of appetite -metallic taste in the mouth or changes in taste This list may not describe all possible side effects. Call your doctor for medical advice about side effects. You may report side effects to FDA at 1-800-FDA-1088. Where should I keep my medicine? This drug is given in a hospital or clinic and will not be stored at home. NOTE: This sheet is a summary. It may not cover all possible information. If you have questions about this medicine, talk to your doctor, pharmacist, or health care provider.  2018 [employer] Standard (2007-05-05 14:38:05)   Gemcitabine injection GEMZAR What is this medicine? GEMCITABINE (jem SIT a been) is a chemotherapy drug. This medicine is used to treat many types of cancer like breast cancer, lung cancer, pancreatic cancer, and ovarian cancer. This medicine may be used for other purposes; ask your health care provider or pharmacist if you have questions. COMMON BRAND NAME(S): Gemzar What should I tell my health care provider before I take this medicine? They need to know if you have any of these conditions: -blood disorders -infection -kidney disease -liver disease -recent or ongoing radiation therapy -an unusual or allergic reaction to gemcitabine, other chemotherapy, other medicines, foods, dyes, or preservatives -pregnant or trying to get pregnant -  breast-feeding How should I use this medicine? This drug is given as an infusion into a vein. It is administered in a hospital or clinic by a specially trained health care professional. Talk to your pediatrician regarding the use of this medicine in children. Special care may be needed. Overdosage: If you think you  have taken too much of this medicine contact a poison control center or emergency room at once. NOTE: This medicine is only for you. Do not share this medicine with others. What if I miss a dose? It is important not to miss your dose. Call your doctor or health care professional if you are unable to keep an appointment. What may interact with this medicine? -medicines to increase blood counts like filgrastim, pegfilgrastim, sargramostim -some other chemotherapy drugs like cisplatin -vaccines Talk to your doctor or health care professional before taking any of these medicines: -acetaminophen -aspirin -ibuprofen -ketoprofen -naproxen This list may not describe all possible interactions. Give your health care provider a list of all the medicines, herbs, non-prescription drugs, or dietary supplements you use. Also tell them if you smoke, drink alcohol, or use illegal drugs. Some items may interact with your medicine. What should I watch for while using this medicine? Visit your doctor for checks on your progress. This drug may make you feel generally unwell. This is not uncommon, as chemotherapy can affect healthy cells as well as cancer cells. Report any side effects. Continue your course of treatment even though you feel ill unless your doctor tells you to stop. In some cases, you may be given additional medicines to help with side effects. Follow all directions for their use. Call your doctor or health care professional for advice if you get a fever, chills or sore throat, or other symptoms of a cold or flu. Do not treat yourself. This drug decreases your body's ability to fight infections. Try to avoid being around people who are sick. This medicine may increase your risk to bruise or bleed. Call your doctor or health care professional if you notice any unusual bleeding. Be careful brushing and flossing your teeth or using a toothpick because you may get an infection or bleed more easily. If you  have any dental work done, tell your dentist you are receiving this medicine. Avoid taking products that contain aspirin, acetaminophen, ibuprofen, naproxen, or ketoprofen unless instructed by your doctor. These medicines may hide a fever. Women should inform their doctor if they wish to become pregnant or think they might be pregnant. There is a potential for serious side effects to an unborn child. Talk to your health care professional or pharmacist for more information. Do not breast-feed an infant while taking this medicine. What side effects may I notice from receiving this medicine? Side effects that you should report to your doctor or health care professional as soon as possible: -allergic reactions like skin rash, itching or hives, swelling of the face, lips, or tongue -low blood counts - this medicine may decrease the number of white blood cells, red blood cells and platelets. You may be at increased risk for infections and bleeding. -signs of infection - fever or chills, cough, sore throat, pain or difficulty passing urine -signs of decreased platelets or bleeding - bruising, pinpoint red spots on the skin, black, tarry stools, blood in the urine -signs of decreased red blood cells - unusually weak or tired, fainting spells, lightheadedness -breathing problems -chest pain -mouth sores -nausea and vomiting -pain, swelling, redness at site where injected -pain,   tingling, numbness in the hands or feet -stomach pain -swelling of ankles, feet, hands -unusual bleeding Side effects that usually do not require medical attention (report to your doctor or health care professional if they continue or are bothersome): -constipation -diarrhea -hair loss -loss of appetite -stomach upset This list may not describe all possible side effects. Call your doctor for medical advice about side effects. You may report side effects to FDA at 1-800-FDA-1088. Where should I keep my medicine? This drug is  given in a hospital or clinic and will not be stored at home. NOTE: This sheet is a summary. It may not cover all possible information. If you have questions about this medicine, talk to your doctor, pharmacist, or health care provider.  2018 [employer]/Gold Standard (2007-06-09 18:45:54)    

## 2016-09-25 NOTE — Progress Notes (Signed)
. [name]   DOB: 05/12/1940  MR#: [id number]  CSN#:[id number]  PCP: [name], MD Patient Care Team: [name], MD as PCP - General Magrinat, [name], MD as Consulting Physician (Oncology) [name], MD as Consulting Physician (General Surgery) [name], MD as Consulting Physician (Gastroenterology)   INTERVAL HISTORY: [name] returns today for follow-up and treatment of her very weakly estrogen receptor positive breast cancer, being treated adjuvantly with carboplatin and gemcitabine, given days 1 and 8 of each 21 day cycle, with 4 cycles planned. Today is day 8 cycle 1.  She did remarkably well with the first chemotherapy dose. The main problem was that she could not sleep at all the first night and only barely the second night. Doubtless this is going to be related to her Decadron.  However she had no nausea no vomiting no mouth sores, no symptoms of peripheral neuropathy, no unusual fatigue and really no other symptoms. She has not begun to lose her hair yet but tells me she is ready has 3 weeks from her earlier chemotherapy treatments and will use them as needed   REVIEW OF SYSTEMS: A detailed review of systems today was otherwise noncontributory   BREAST CANCER HISTORY: From the recent summary note:  I last saw [name] [name] in 2013 when she was released from follow-up from her right breast cancer recurrence in 2004. She is status post right mastectomy and adjuvant chemotherapy for that triple negative tumor. More recently, on 07/16/2016 she underwent left screening mammography at the [facility] on 07/16/2016. This found the breast density to be category B. There was a possible asymmetry in the left breast, and on 07/19/2016 she underwent left diagnostic mammography with tomography and left breast ultrasonography. This confirmed a well circumscribed oval mass at the 9:00 position of the left breast measuring approximately 0.8 cm. This was not  palpable. On ultrasonography the mass measured 0.6 cm and it was located at the 9:00 radiant 4 cm from the nipple. The left axilla was sonographically benign.  On June 11 [name] underwent biopsy of the left breast mass in question and this showed (SAA [phone]) and invasive ductal carcinoma, grade 3, estrogen receptor 5% positive with weak staining intensity, progesterone receptor negative, with an MIB-1 of 80%, and HER-2 nonamplified, the signals ratio being 1.46 and the number per cell 1.90.  Her subsequent history is as detailed below.   PAST MEDICAL HISTORY: Past Medical History:  Diagnosis Date  . Allergic rhinitis   . Anxiety   . Breast cancer ([place]) hx 2004   recurrent 2006 DR Magrinat  . HTN (hypertension)   . Hyperlipidemia   . Hypothyroidism   . Personal history of chemotherapy 2002  . Personal history of radiation therapy 2002  . Psoriasis   . S/P thyroidectomy 07/2005   2 cm largest diameter (1 other tiny focus)/ i-131 rx 99 [facility] 08/2005  . Thyroid cancer ([place])    Papillary Stage 1 - Dr Loanne Drilling  . Vitamin B12 deficiency 2009  . Vitamin D deficiency 2009    PAST SURGICAL HISTORY: Past Surgical History:  Procedure Laterality Date  . BREAST LUMPECTOMY    . BREAST LUMPECTOMY WITH RADIOACTIVE SEED AND SENTINEL LYMPH NODE BIOPSY Left 09/11/2016   Procedure: LEFT BREAST LUMPECTOMY WITH RADIOACTIVE SEED AND LEFT AXILLARY SENTINEL LYMPH NODE BIOPSY WITH BLUE DYE INJECTION;  Surgeon: [name], MD;  Location: [facility];  Service: General;  Laterality: Left;  . IR FLUORO GUIDE PORT  INSERTION RIGHT  09/13/2016  . IR US GUIDE VASC ACCESS RIGHT  09/13/2016  . MASTECTOMY     Right  . PORTACATH PLACEMENT N/A 09/11/2016   Procedure: ATTEMPTED INSERTION PORT-A-CATH WITH ULTRA SOUND GUIDANCE;  Surgeon: [name], MD;  Location: [facility];  Service: General;  Laterality: N/A;  . REDUCTION MAMMAPLASTY Left 2005  . THYROIDECTOMY  2007    FAMILY  HISTORY Family History  Problem Relation Age of Onset  . Stroke Mother   . Allergies Mother   . Asthma Mother   . Clotting disorder Mother   . Heart disease Father 27       MI  . Allergies Sister   . Asthma Sister   . Cancer Sister        Breast  . Asthma Brother   . Allergies Daughter   . Asthma Daughter   . Allergies Sister   Family history includes a sister, a paternal aunt, and a paternal cousin with breast cancer all older then 54 at diagnosis  GYN HISTORY: Menarche age 89, first live birth age 3. She is GX P3. She went through the change of life age 40. She did not take hormone replacement.  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 Her husband [name] is retired from [employer]. Her children are [name], who works for time [employer] in [place], and [name], who lives in [place] and works for [employer]. The third child, [name], died in an automobile accident at age 71. The patient has one grandchild. 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 Smoking status: Never Smoker  . Smokeless tobacco: Never Used  . Alcohol use No     Colonoscopy:  PAP:  Bone density:  Lipid panel:  Allergies  Allergen Reactions  . Pneumococcal Vaccines Other (See Comments)    Was really sick   . Sulfa Antibiotics Other (See Comments)    Pt believes it was hives  . Sulfacetamide Sodium-Sulfur   . Tape Itching, Dermatitis and Rash  . Tegaderm Ag Mesh [Silver] Itching and Rash    Current Outpatient Prescriptions  Medication Sig Dispense Refill  . ALPRAZolam (XANAX) 0.25 MG tablet Take 0.125-0.25 mg by mouth 2 (two) times daily as needed for anxiety or sleep (Pt takes a half of a tablet every evening).     . cholecalciferol (VITAMIN D) 1000 units tablet Take 1,000 Units by mouth daily.    . cyanocobalamin (,VITAMIN B-12,) 1000 MCG/ML injection 1 ml q 2 wks  (Patient taking differently: Inject 1,000 mcg into the muscle See admin instructions. Every 60 days) 10 mL 6  . dexamethasone (DECADRON) 4 MG tablet Take 2 tablets (8 mg total) by mouth daily. Start the day after chemotherapy for 2 days. Take with food. 30 tablet 1  . DULoxetine (CYMBALTA) 60 MG capsule Take 60 mg by mouth every morning.     [name] HYDROcodone-acetaminophen (NORCO) 5-325 MG tablet Take 1-2 tablets by mouth every 6 (six) hours as needed for moderate pain or severe pain. 30 tablet 0  . levothyroxine (SYNTHROID, LEVOTHROID) 112 MCG tablet TAKE 1 TABLET (112 MCG TOTAL) BY MOUTH DAILY. 90 tablet 3  . lidocaine-prilocaine (EMLA) cream Apply to affected area once 30 g 3  . losartan (COZAAR) 100 MG tablet TAKE 1 TABLET BY MOUTH EVERY DAY FOR BLOOD PRESSURE 90 tablet 1  . prochlorperazine (COMPAZINE)  10 MG tablet Take 1 tablet (10 mg total) by mouth every 6 (six) hours as needed (Nausea or vomiting). 30 tablet 1  . rosuvastatin (CRESTOR) 20 MG tablet Take 1 tablet (20 mg total) by mouth daily. 90 tablet 2  . Syringe/Needle, Disp, (B-D ECLIPSE SYRINGE) 30G X 1/2" 1 ML MISC 1 each by Does not apply route 1 day or 1 dose. For Vit B12 inj 50 each 11  . vitamin B-12 (CYANOCOBALAMIN) 1000 MCG tablet Take 1,000 mcg by mouth every other day.     No current facility-administered medications for this visit.     OBJECTIVE: Older white woman in no acute distress  Vitals:   09/25/16 1101  BP: (!) 152/90  Pulse: 98  Resp: 18  SpO2: 98%     Body mass index is 24.61 kg/m.    ECOG FS: 1  Sclerae unicteric, pupils round and equal Oropharynx clear and moist No cervical or supraclavicular adenopathy Lungs no rales or rhonchi Heart regular rate and rhythm Abd soft, nontender, positive bowel sounds MSK no focal spinal tenderness, no upper extremity lymphedema Neuro: nonfocal, well oriented, appropriate affect Breasts: Deferred; she was concerned regarding a fullness in her left axilla. All I find  there is postop changes by exam   LAB RESULTS: Lab Results  Component Value Date   WBC 5.4 09/18/2016   NEUTROABS 2.7 09/18/2016   HGB 13.3 09/18/2016   HCT 39.3 09/18/2016   MCV 90.6 09/18/2016   PLT 231 09/18/2016      Chemistry      Component Value Date/Time   NA 140 09/18/2016 1108   K 3.6 09/18/2016 1108   CL 108 09/13/2016 0728   CL 104 10/09/2011 1403   CO2 23 09/18/2016 1108   BUN 19.5 09/18/2016 1108   CREATININE 0.9 09/18/2016 1108      Component Value Date/Time   CALCIUM 9.8 09/18/2016 1108   ALKPHOS 113 09/18/2016 1108   AST 20 09/18/2016 1108   ALT 13 09/18/2016 1108   BILITOT 0.91 09/18/2016 1108       Lab Results  Component Value Date   LABCA2 16 10/09/2011    No components found for: [id number]  No results for input(s): INR in the last 168 hours.  Urinalysis    Component Value Date/Time   COLORURINE YELLOW 06/05/2015 1559   APPEARANCEUR CLEAR 06/05/2015 1559   LABSPEC 1.020 06/05/2015 1559   PHURINE 5.5 06/05/2015 1559   GLUCOSEU NEGATIVE 06/05/2015 1559   HGBUR NEGATIVE 06/05/2015 1559   BILIRUBINUR NEGATIVE 06/05/2015 1559   KETONESUR NEGATIVE 06/05/2015 1559   UROBILINOGEN 0.2 06/05/2015 1559   NITRITE NEGATIVE 06/05/2015 1559   LEUKOCYTESUR MODERATE (A) 06/05/2015 1559    STUDIES: Mm Breast Surgical Specimen  Result Date: 09/11/2016 CLINICAL DATA:  Status post left breast lumpectomy today after earlier radioactive seed localization. EXAM: SPECIMEN RADIOGRAPH OF THE LEFT BREAST COMPARISON:  Previous exam(s). FINDINGS: Status post excision of the left breast. The radioactive seed and biopsy marker clip are present, completely intact, and were marked for pathology. The positions of the radioactive seed and biopsy marker clip within the specimen were discussed with the OR staff during the procedure. IMPRESSION: Specimen radiograph of the left breast. Electronically Signed   By: Franki Cabot M.D.   On: 09/11/2016 10:01   Ir US Guide Vasc  Access Right  Result Date: 09/13/2016 INDICATION: 76 year old female with history of breast cancer. Previous right mastectomy. Recent left breast lumpectomy. Patient needs Port-A-Cath for additional treatment.  EXAM: FLUOROSCOPIC AND ULTRASOUND GUIDED PLACEMENT OF A SUBCUTANEOUS PORT COMPARISON:  None. MEDICATIONS: Ancef 2 g; The antibiotic was administered within an appropriate time interval prior to skin puncture. ANESTHESIA/SEDATION: Versed 2.0 mg IV; Fentanyl 100 mcg IV; Moderate Sedation Time:  44 minutes The patient was continuously monitored during the procedure by the interventional radiology nurse under my direct supervision. FLUOROSCOPY TIME:  54 seconds, 2.2 mGy COMPLICATIONS: None immediate. PROCEDURE: The procedure, risks, benefits, and alternatives were explained to the patient. Questions regarding the procedure were encouraged and answered. The patient understands and consents to the procedure. Patient was placed supine on the interventional table. Ultrasound confirmed a patent right internal jugular vein. The right chest and neck were cleaned with a skin antiseptic and a sterile drape was placed. Maximal barrier sterile technique was utilized including caps, mask, sterile gowns, sterile gloves, sterile drape, hand hygiene and skin antiseptic. The right neck was anesthetized with 1% lidocaine. Small incision was made in the right neck with a blade. Micropuncture set was placed in the right internal jugular vein with ultrasound guidance. The micropuncture wire was used for measurement purposes. The right chest was anesthetized with 1% lidocaine with epinephrine. #15 blade was used to make an incision and a subcutaneous port pocket was formed. [place] was assembled. Subcutaneous tunnel was formed with a stiff tunneling device. The port catheter was brought through the subcutaneous tunnel. The port was placed in the subcutaneous pocket and sutured in place. The micropuncture set was exchanged  for a peel-away sheath. The catheter was placed through the peel-away sheath and the tip was positioned at the superior cavoatrial junction. Catheter placement was confirmed with fluoroscopy. The port was accessed and flushed with heparinized saline. The port pocket was closed using two layers of absorbable sutures and Dermabond. The vein skin site was closed using a single layer of absorbable suture and Dermabond. Sterile dressings were applied. Patient tolerated the procedure well without an immediate complication. Ultrasound and fluoroscopic images were taken and saved for this procedure. IMPRESSION: Placement of a right chest subcutaneous port device. Electronically Signed   By: Markus Daft M.D.   On: 09/13/2016 10:38   Dg Chest Port 1 View  Result Date: 09/11/2016 CLINICAL DATA:  Failed attempted Port a catheter placement. Evaluate for pneumothorax. EXAM: PORTABLE CHEST 1 VIEW COMPARISON:  05/17/2008 ; 11/19/2007 FINDINGS: Normal cardiac silhouette and mediastinal contours with atherosclerotic plaque within the thoracic aorta. There is mild elevation of the right hemidiaphragm. One venous congestion without frank evidence of edema. Minimal bilateral infrahilar opacities favored to represent atelectasis. No pleural effusion or pneumothorax. Post right-sided mastectomy. Surgical clips overlie the right axilla. Additional surgical clips overlie the left breast. No radiopaque foreign body. IMPRESSION: 1. No evidence of complication following attempted Port a catheter placement. Specifically, no definite pneumothorax. 2.  Aortic Atherosclerosis (ICD10-I70.0). Electronically Signed   By: Sandi Mariscal M.D.   On: 09/11/2016 11:30   Dg C-arm 1-60 Min-no Report  Result Date: 09/11/2016 Fluoroscopy was utilized by the requesting physician.  No radiographic interpretation.   Mm Lt Radioactive Seed Loc Mammo Guide  Result Date: 09/10/2016 CLINICAL DATA:  Patient presents for radioactive seed localization of a small  left breast carcinoma. EXAM: MAMMOGRAPHIC GUIDED RADIOACTIVE SEED LOCALIZATION OF THE LEFT BREAST COMPARISON:  Previous exam(s). FINDINGS: Patient presents for radioactive seed localization prior to surgical excision. I met with the patient and we discussed the procedure of seed localization including benefits and alternatives. We discussed the high  likelihood of a successful procedure. We discussed the risks of the procedure including infection, bleeding, tissue injury and further surgery. We discussed the low dose of radioactivity involved in the procedure. Informed, written consent was given. The usual time-out protocol was performed immediately prior to the procedure. Using mammographic guidance, sterile technique, 1% lidocaine and an I-125 radioactive seed, the ribbon shaped biopsy clip in the medial left breast was localized using a medial approach. The follow-up mammogram images confirm the seed in the expected location and were marked for Dr. Dalbert Batman. Follow-up survey of the patient confirms presence of the radioactive seed. Order number of I-125 seed:  [id number]. Total activity:  4.098 millicuries  Reference Date: 08/29/2016 The patient tolerated the procedure well and was released from the [facility]. She was given instructions regarding seed removal. IMPRESSION: Radioactive seed localization of the left breast. No apparent complications. Electronically Signed   By: Lajean Manes M.D.   On: 09/10/2016 13:38   Ir Fluoro Guide Port Insertion Right  Result Date: 09/13/2016 INDICATION: 76 year old female with history of breast cancer. Previous right mastectomy. Recent left breast lumpectomy. Patient needs Port-A-Cath for additional treatment. EXAM: FLUOROSCOPIC AND ULTRASOUND GUIDED PLACEMENT OF A SUBCUTANEOUS PORT COMPARISON:  None. MEDICATIONS: Ancef 2 g; The antibiotic was administered within an appropriate time interval prior to skin puncture. ANESTHESIA/SEDATION: Versed 2.0 mg IV; Fentanyl 100 mcg  IV; Moderate Sedation Time:  44 minutes The patient was continuously monitored during the procedure by the interventional radiology nurse under my direct supervision. FLUOROSCOPY TIME:  54 seconds, 2.2 mGy COMPLICATIONS: None immediate. PROCEDURE: The procedure, risks, benefits, and alternatives were explained to the patient. Questions regarding the procedure were encouraged and answered. The patient understands and consents to the procedure. Patient was placed supine on the interventional table. Ultrasound confirmed a patent right internal jugular vein. The right chest and neck were cleaned with a skin antiseptic and a sterile drape was placed. Maximal barrier sterile technique was utilized including caps, mask, sterile gowns, sterile gloves, sterile drape, hand hygiene and skin antiseptic. The right neck was anesthetized with 1% lidocaine. Small incision was made in the right neck with a blade. Micropuncture set was placed in the right internal jugular vein with ultrasound guidance. The micropuncture wire was used for measurement purposes. The right chest was anesthetized with 1% lidocaine with epinephrine. #15 blade was used to make an incision and a subcutaneous port pocket was formed. Z[place] was assembled. Subcutaneous tunnel was formed with a stiff tunneling device. The port catheter was brought through the subcutaneous tunnel. The port was placed in the subcutaneous pocket and sutured in place. The micropuncture set was exchanged for a peel-away sheath. The catheter was placed through the peel-away sheath and the tip was positioned at the superior cavoatrial junction. Catheter placement was confirmed with fluoroscopy. The port was accessed and flushed with heparinized saline. The port pocket was closed using two layers of absorbable sutures and Dermabond. The vein skin site was closed using a single layer of absorbable suture and Dermabond. Sterile dressings were applied. Patient tolerated the  procedure well without an immediate complication. Ultrasound and fluoroscopic images were taken and saved for this procedure. IMPRESSION: Placement of a right chest subcutaneous port device. Electronically Signed   By: Markus Daft M.D.   On: 09/13/2016 10:38      ASSESSMENT: 76 y.o. [place] woman   (1) status post right lumpectomy and sentinel lymph node biopsy in September 2002 for multifocal breast carcinoma, triple  negative.  Treated with CMF followed by radiation.   (2) Local recurrence in April 2004, status post right modified radical mastectomy with TRAM reconstruction for what proved to be a T1c N0, stage IA triple negative breast carcinoma.  Treated adjuvantly with paclitaxel and doxorubicin x4 in 2004.  Off treatment since August 2004 with no evidence of recurrence  (3) history of papillary thyroid cancer s/p thyroidectomy June 2007, s/p radioactive iodine July 2007  (4) status post left breast upper outer quadrant biopsy 08/21/2016 for a clinical T1b N0, stage IB invasive ductal carcinoma, grade 3, weakly estrogen receptor positive, progesterone receptor and HER-2 negative, with an MIB-1 of 80%.  (5) left lumpectomy and sentinel lymph node sampling 09/11/2016 found a pT1b pN0, stage IB invasive ductal carcinoma, grade 3, with negative margins.  (6) adjuvant chemotherapy consisting of carboplatin and gemcitabine given days 1 and 8 of each 21 day cycle 4 cycles, started 09/18/2016  (7) adjuvant radiation to follow  (8) genetics testing pending.  PLAN:   Frida did remarkably well with her first dose of chemotherapy. The only real problem she had was insomnia. Doubtless this is going to be due to her Decadron.  Accordingly I have drop the Decadron dose in her premeds from 10 mg to 4 mg. I am also changing her home nausea medicines. She will use the Compazine on day 2 and then as needed. She will only use the Decadron at home as needed and she will only take 4 mg instead of 8 mg  twice a day when necessary.  Her counts today are very favorable and I am not adding on Pro at this point. If we see a decline in her [place] in the future that can be added to the day 8 treatment.  I have scheduled visits with every treatment either with me or with my 44 assistant. I do plan to see her with the physician's assistant during those visits just to make sure everything continues well  She is having some family issues. Apparently some family members think that because she has cancer she is going to died next week. I reassured her that our goal here is cure and that her chance of cure is good  She knows to call for any other problems that may develop before her return here.    MAGRINAT,GUSTAV C    09/25/2016

## 2016-09-30 ENCOUNTER — Other Ambulatory Visit: Payer: Self-pay | Admitting: Oncology

## 2016-10-09 ENCOUNTER — Ambulatory Visit: Payer: Medicare Other

## 2016-10-09 ENCOUNTER — Ambulatory Visit (HOSPITAL_BASED_OUTPATIENT_CLINIC_OR_DEPARTMENT_OTHER): Payer: Medicare Other | Admitting: Oncology

## 2016-10-09 ENCOUNTER — Other Ambulatory Visit (HOSPITAL_BASED_OUTPATIENT_CLINIC_OR_DEPARTMENT_OTHER): Payer: Medicare Other

## 2016-10-09 VITALS — BP 135/92 | HR 92 | Temp 98.2°F | Resp 18 | Ht 64.0 in | Wt 144.1 lb

## 2016-10-09 DIAGNOSIS — C50412 Malignant neoplasm of upper-outer quadrant of left female breast: Secondary | ICD-10-CM

## 2016-10-09 DIAGNOSIS — C50012 Malignant neoplasm of nipple and areola, left female breast: Secondary | ICD-10-CM

## 2016-10-09 DIAGNOSIS — R6884 Jaw pain: Secondary | ICD-10-CM

## 2016-10-09 DIAGNOSIS — Z853 Personal history of malignant neoplasm of breast: Secondary | ICD-10-CM

## 2016-10-09 DIAGNOSIS — Z170 Estrogen receptor positive status [ER+]: Principal | ICD-10-CM

## 2016-10-09 LAB — COMPREHENSIVE METABOLIC PANEL
ALT: 25 U/L (ref 0–55)
ANION GAP: 7 meq/L (ref 3–11)
AST: 26 U/L (ref 5–34)
Albumin: 3.7 g/dL (ref 3.5–5.0)
Alkaline Phosphatase: 98 U/L (ref 40–150)
BILIRUBIN TOTAL: 0.59 mg/dL (ref 0.20–1.20)
BUN: 21.8 mg/dL (ref 7.0–26.0)
CHLORIDE: 109 meq/L (ref 98–109)
CO2: 25 mEq/L (ref 22–29)
CREATININE: 1.1 mg/dL (ref 0.6–1.1)
Calcium: 9.7 mg/dL (ref 8.4–10.4)
EGFR: 50 mL/min/{1.73_m2} — ABNORMAL LOW (ref >90)
Glucose: 105 mg/dl (ref 70–140)
Potassium: 3.8 mEq/L (ref 3.5–5.1)
Sodium: 141 mEq/L (ref 136–145)
Total Protein: 7.2 g/dL (ref 6.4–8.3)

## 2016-10-09 LAB — CBC WITH DIFFERENTIAL/PLATELET
BASO%: 0.5 % (ref 0.0–2.0)
BASOS ABS: 0 10*3/uL (ref 0.0–0.1)
EOS%: 2.4 % (ref 0.0–7.0)
Eosinophils Absolute: 0.1 10*3/uL (ref 0.0–0.5)
HEMATOCRIT: 35.8 % (ref 34.8–46.6)
HGB: 12.2 g/dL (ref 11.6–15.9)
LYMPH#: 1.6 10*3/uL (ref 0.9–3.3)
LYMPH%: 47.1 % (ref 14.0–49.7)
MCH: 31.4 pg (ref 25.1–34.0)
MCHC: 34.2 g/dL (ref 31.5–36.0)
MCV: 91.8 fL (ref 79.5–101.0)
MONO#: 0.4 10*3/uL (ref 0.1–0.9)
MONO%: 12.8 % (ref 0.0–14.0)
NEUT#: 1.3 10*3/uL — ABNORMAL LOW (ref 1.5–6.5)
NEUT%: 37.2 % — AB (ref 38.4–76.8)
PLATELETS: 441 10*3/uL — AB (ref 145–400)
RBC: 3.9 10*6/uL (ref 3.70–5.45)
RDW: 13.6 % (ref 11.2–14.5)
WBC: 3.4 10*3/uL — ABNORMAL LOW (ref 3.9–10.3)

## 2016-10-09 NOTE — Progress Notes (Signed)
. [name]   DOB: October 29, 1940  MR#: [id number]  CSN#:[id number]  PCP: [name], MD Patient Care Team: [name], MD as PCP - General Alyna Stensland, [name], MD as Consulting Physician (Oncology) [name], MD as Consulting Physician (General Surgery) [name], MD as Consulting Physician (Gastroenterology)   CHIEF COMPLAINT: Weakly estrogen receptor positive breast cancer  CURRENT TREATMENT: Adjuvant chemotherapy   INTERVAL HISTORY: [name] [name] returns today for follow-up and treatment of her weakly estrogen receptor positive left sided breast cancer. Today is day 1 cycle 2 of 4 planned cycles of carboplatin and gemcitabine which she receives on days 1 and 8 of each 21 day cycle.  [name] has developed significant pain in her left lower jaw. She does not have a fever but she very likely has an infection there and may need antibiotics. She is going to be seeing her oral surgeon today.  She did not receive Neulasta with her first cycle. We wanted to see how her counts held up. Accordingly her neutrophils are borderline today at 1.3. For all these reasons I think it would be prudent to postpone her treatment 1 week  REVIEW OF SYSTEMS: She tells me the premeds were worse than the chemotherapy. She receives spell and also drawn and dexamethasone. I'm going to drop the dexamethasone dose to 4 mg. Hopefully that will take care of the problem. On the plus side she has not lost her hair, has had no mouth sores, no unexplained fatigue, no rash, no peripheral neuropathy. Her port is working well. A detailed review of systems today was stable   BREAST CANCER HISTORY: From the recent summary note:  I last saw [name] [name] in 2013 when she was released from follow-up from her right breast cancer recurrence in 2004. She is status post right mastectomy and adjuvant chemotherapy for that triple negative tumor. More recently, on 07/16/2016 she underwent left screening mammography at  the [facility] on 07/16/2016. This found the breast density to be category B. There was a possible asymmetry in the left breast, and on 07/19/2016 she underwent left diagnostic mammography with tomography and left breast ultrasonography. This confirmed a well circumscribed oval mass at the 9:00 position of the left breast measuring approximately 0.8 cm. This was not palpable. On ultrasonography the mass measured 0.6 cm and it was located at the 9:00 radiant 4 cm from the nipple. The left axilla was sonographically benign.  On June 11 [name] underwent biopsy of the left breast mass in question and this showed ([id number]) and invasive ductal carcinoma, grade 3, estrogen receptor 5% positive with weak staining intensity, progesterone receptor negative, with an MIB-1 of 80%, and HER-2 nonamplified, the signals ratio being 1.46 and the number per cell 1.90.  Her subsequent history is as detailed below.   PAST MEDICAL HISTORY: Past Medical History:  Diagnosis Date  . Allergic rhinitis   . Anxiety   . Breast cancer ([place]) hx 2004   recurrent 2006 DR Cruze Zingaro  . HTN (hypertension)   . Hyperlipidemia   . Hypothyroidism   . Personal history of chemotherapy 2002  . Personal history of radiation therapy 2002  . Psoriasis   . S/P thyroidectomy 07/2005   2 cm largest diameter (1 other tiny focus)/ i-131 rx 99 [facility] 08/2005  . Thyroid cancer ([place])    Papillary Stage 1 - Dr Loanne Drilling  . Vitamin B12 deficiency 2009  . Vitamin D deficiency 2009    PAST SURGICAL HISTORY: Past Surgical History:  Procedure Laterality Date  . BREAST LUMPECTOMY    . BREAST LUMPECTOMY WITH RADIOACTIVE SEED AND SENTINEL LYMPH NODE BIOPSY Left 09/11/2016   Procedure: LEFT BREAST LUMPECTOMY WITH RADIOACTIVE SEED AND LEFT AXILLARY SENTINEL LYMPH NODE BIOPSY WITH BLUE DYE INJECTION;  Surgeon: [name], MD;  Location: [facility];  Service: General;  Laterality: Left;  . IR FLUORO GUIDE PORT INSERTION RIGHT   09/13/2016  . IR US GUIDE VASC ACCESS RIGHT  09/13/2016  . MASTECTOMY     Right  . PORTACATH PLACEMENT N/A 09/11/2016   Procedure: ATTEMPTED INSERTION PORT-A-CATH WITH ULTRA SOUND GUIDANCE;  Surgeon: [name], MD;  Location: [facility];  Service: General;  Laterality: N/A;  . REDUCTION MAMMAPLASTY Left 2005  . THYROIDECTOMY  2007    FAMILY HISTORY Family History  Problem Relation Age of Onset  . Stroke Mother   . Allergies Mother   . Asthma Mother   . Clotting disorder Mother   . Heart disease Father 12       MI  . Allergies Sister   . Asthma Sister   . Cancer Sister        Breast  . Asthma Brother   . Allergies Daughter   . Asthma Daughter   . Allergies Sister   Family history includes a sister, a paternal aunt, and a paternal cousin with breast cancer all older then 29 at diagnosis  GYN HISTORY: Menarche age 16, first live birth age 54. She is GX P3. She went through the change of life age 89. She did not take hormone replacement.  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 Her husband [name] is retired from [employer]. Her children are [name], who works for time [employer] in [place], and [name], who lives in [place] and works for [employer]. The third child, [name], died in an automobile accident at age 23. The patient has one grandchild. 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 Smoking status: Never Smoker  . Smokeless tobacco: Never Used  . Alcohol use No     Colonoscopy:  PAP:  Bone density:  Lipid panel:  Allergies  Allergen Reactions  . Pneumococcal Vaccines Other (See Comments)    Was really sick   . Sulfa Antibiotics Other (See Comments)    Pt believes it was hives  . Sulfacetamide Sodium-Sulfur   . Tape Itching, Dermatitis and Rash  . Tegaderm Ag Mesh [Silver] Itching and Rash    Current Outpatient  Prescriptions  Medication Sig Dispense Refill  . ALPRAZolam (XANAX) 0.25 MG tablet Take 0.125-0.25 mg by mouth 2 (two) times daily as needed for anxiety or sleep (Pt takes a half of a tablet every evening).     . cholecalciferol (VITAMIN D) 1000 units tablet Take 1,000 Units by mouth daily.    [name] dexamethasone (DECADRON) 4 MG tablet Take 1 tablet twice a day with food AS NEEDED for nausea 30 tablet 1  . DULoxetine (CYMBALTA) 60 MG capsule Take 60 mg by mouth every morning.     [name] levothyroxine (SYNTHROID, LEVOTHROID) 112 MCG tablet TAKE 1 TABLET (112 MCG TOTAL) BY MOUTH DAILY. 90 tablet 3  . lidocaine-prilocaine (EMLA) cream Apply to affected area once 30 g 3  . losartan (COZAAR) 100 MG tablet TAKE 1 TABLET BY MOUTH EVERY DAY FOR BLOOD PRESSURE 90 tablet 1  . prochlorperazine (COMPAZINE)  10 MG tablet Take before meals and at bedtime day after chemo, then as needed for nausea 30 tablet 1  . rosuvastatin (CRESTOR) 20 MG tablet Take 1 tablet (20 mg total) by mouth daily. 90 tablet 2  . Syringe/Needle, Disp, (B-D ECLIPSE SYRINGE) 30G X 1/2" 1 ML MISC 1 each by Does not apply route 1 day or 1 dose. For Vit B12 inj 50 each 11  . vitamin B-12 (CYANOCOBALAMIN) 1000 MCG tablet Take 1,000 mcg by mouth every other day.     No current facility-administered medications for this visit.     OBJECTIVE: Older white woman Who appears stated age  89:   10/09/16 1019  BP: (!) 135/92  Pulse: 92  Resp: 18  Temp: 98.2 F (36.8 C)  SpO2: 99%     Body mass index is 24.73 kg/m.    ECOG FS: 1  Sclerae unicteric, EOMs intact Oropharynx clear and moist; no obvious abscess noted by exam No cervical or supraclavicular adenopathy Lungs no rales or rhonchi Heart regular rate and rhythm Abd soft, nontender, positive bowel sounds MSK no focal spinal tenderness, no upper extremity lymphedema Neuro: nonfocal, well oriented, appropriate affect Breasts: The left breast is status post lumpectomy. The incision  continues to heal nicely. There is no erythema, swelling, or dehiscence.  LAB RESULTS: Lab Results  Component Value Date   WBC 3.4 (L) 10/09/2016   NEUTROABS 1.3 (L) 10/09/2016   HGB 12.2 10/09/2016   HCT 35.8 10/09/2016   MCV 91.8 10/09/2016   PLT 441 (H) 10/09/2016      Chemistry      Component Value Date/Time   NA 137 09/25/2016 1037   K 3.9 09/25/2016 1037   CL 108 09/13/2016 0728   CL 104 10/09/2011 1403   CO2 26 09/25/2016 1037   BUN 19.9 09/25/2016 1037   CREATININE 1.0 09/25/2016 1037      Component Value Date/Time   CALCIUM 9.6 09/25/2016 1037   ALKPHOS 106 09/25/2016 1037   AST 18 09/25/2016 1037   ALT 19 09/25/2016 1037   BILITOT 0.67 09/25/2016 1037       Lab Results  Component Value Date   LABCA2 16 10/09/2011    No components found for: [id number]  No results for input(s): INR in the last 168 hours.  Urinalysis    Component Value Date/Time   COLORURINE YELLOW 06/05/2015 1559   APPEARANCEUR CLEAR 06/05/2015 1559   LABSPEC 1.020 06/05/2015 1559   PHURINE 5.5 06/05/2015 1559   GLUCOSEU NEGATIVE 06/05/2015 1559   HGBUR NEGATIVE 06/05/2015 1559   BILIRUBINUR NEGATIVE 06/05/2015 1559   KETONESUR NEGATIVE 06/05/2015 1559   UROBILINOGEN 0.2 06/05/2015 1559   NITRITE NEGATIVE 06/05/2015 1559   LEUKOCYTESUR MODERATE (A) 06/05/2015 1559    STUDIES: Mm Breast Surgical Specimen  Result Date: 09/11/2016 CLINICAL DATA:  Status post left breast lumpectomy today after earlier radioactive seed localization. EXAM: SPECIMEN RADIOGRAPH OF THE LEFT BREAST COMPARISON:  Previous exam(s). FINDINGS: Status post excision of the left breast. The radioactive seed and biopsy marker clip are present, completely intact, and were marked for pathology. The positions of the radioactive seed and biopsy marker clip within the specimen were discussed with the OR staff during the procedure. IMPRESSION: Specimen radiograph of the left breast. Electronically Signed   By: Franki Cabot  M.D.   On: 09/11/2016 10:01   Ir US Guide Vasc Access Right  Result Date: 09/13/2016 INDICATION: 76 year old female with history of breast cancer. Previous right mastectomy. Recent  left breast lumpectomy. Patient needs Port-A-Cath for additional treatment. EXAM: FLUOROSCOPIC AND ULTRASOUND GUIDED PLACEMENT OF A SUBCUTANEOUS PORT COMPARISON:  None. MEDICATIONS: Ancef 2 g; The antibiotic was administered within an appropriate time interval prior to skin puncture. ANESTHESIA/SEDATION: Versed 2.0 mg IV; Fentanyl 100 mcg IV; Moderate Sedation Time:  44 minutes The patient was continuously monitored during the procedure by the interventional radiology nurse under my direct supervision. FLUOROSCOPY TIME:  54 seconds, 2.2 mGy COMPLICATIONS: None immediate. PROCEDURE: The procedure, risks, benefits, and alternatives were explained to the patient. Questions regarding the procedure were encouraged and answered. The patient understands and consents to the procedure. Patient was placed supine on the interventional table. Ultrasound confirmed a patent right internal jugular vein. The right chest and neck were cleaned with a skin antiseptic and a sterile drape was placed. Maximal barrier sterile technique was utilized including caps, mask, sterile gowns, sterile gloves, sterile drape, hand hygiene and skin antiseptic. The right neck was anesthetized with 1% lidocaine. Small incision was made in the right neck with a blade. Micropuncture set was placed in the right internal jugular vein with ultrasound guidance. The micropuncture wire was used for measurement purposes. The right chest was anesthetized with 1% lidocaine with epinephrine. #15 blade was used to make an incision and a subcutaneous port pocket was formed. [place] was assembled. Subcutaneous tunnel was formed with a stiff tunneling device. The port catheter was brought through the subcutaneous tunnel. The port was placed in the subcutaneous pocket and  sutured in place. The micropuncture set was exchanged for a peel-away sheath. The catheter was placed through the peel-away sheath and the tip was positioned at the superior cavoatrial junction. Catheter placement was confirmed with fluoroscopy. The port was accessed and flushed with heparinized saline. The port pocket was closed using two layers of absorbable sutures and Dermabond. The vein skin site was closed using a single layer of absorbable suture and Dermabond. Sterile dressings were applied. Patient tolerated the procedure well without an immediate complication. Ultrasound and fluoroscopic images were taken and saved for this procedure. IMPRESSION: Placement of a right chest subcutaneous port device. Electronically Signed   By: Markus Daft M.D.   On: 09/13/2016 10:38   Dg Chest Port 1 View  Result Date: 09/11/2016 CLINICAL DATA:  Failed attempted Port a catheter placement. Evaluate for pneumothorax. EXAM: PORTABLE CHEST 1 VIEW COMPARISON:  05/17/2008 ; 11/19/2007 FINDINGS: Normal cardiac silhouette and mediastinal contours with atherosclerotic plaque within the thoracic aorta. There is mild elevation of the right hemidiaphragm. One venous congestion without frank evidence of edema. Minimal bilateral infrahilar opacities favored to represent atelectasis. No pleural effusion or pneumothorax. Post right-sided mastectomy. Surgical clips overlie the right axilla. Additional surgical clips overlie the left breast. No radiopaque foreign body. IMPRESSION: 1. No evidence of complication following attempted Port a catheter placement. Specifically, no definite pneumothorax. 2.  Aortic Atherosclerosis (ICD10-I70.0). Electronically Signed   By: Sandi Mariscal M.D.   On: 09/11/2016 11:30   Dg C-arm 1-60 Min-no Report  Result Date: 09/11/2016 Fluoroscopy was utilized by the requesting physician.  No radiographic interpretation.   Mm Lt Radioactive Seed Loc Mammo Guide  Result Date: 09/10/2016 CLINICAL DATA:  Patient  presents for radioactive seed localization of a small left breast carcinoma. EXAM: MAMMOGRAPHIC GUIDED RADIOACTIVE SEED LOCALIZATION OF THE LEFT BREAST COMPARISON:  Previous exam(s). FINDINGS: Patient presents for radioactive seed localization prior to surgical excision. I met with the patient and we discussed the procedure of seed  localization including benefits and alternatives. We discussed the high likelihood of a successful procedure. We discussed the risks of the procedure including infection, bleeding, tissue injury and further surgery. We discussed the low dose of radioactivity involved in the procedure. Informed, written consent was given. The usual time-out protocol was performed immediately prior to the procedure. Using mammographic guidance, sterile technique, 1% lidocaine and an I-125 radioactive seed, the ribbon shaped biopsy clip in the medial left breast was localized using a medial approach. The follow-up mammogram images confirm the seed in the expected location and were marked for Dr. Dalbert Batman. Follow-up survey of the patient confirms presence of the radioactive seed. Order number of I-125 seed:  [id number]. Total activity:  2.951 millicuries  Reference Date: 08/29/2016 The patient tolerated the procedure well and was released from the [facility]. She was given instructions regarding seed removal. IMPRESSION: Radioactive seed localization of the left breast. No apparent complications. Electronically Signed   By: Lajean Manes M.D.   On: 09/10/2016 13:38   Ir Fluoro Guide Port Insertion Right  Result Date: 09/13/2016 INDICATION: 76 year old female with history of breast cancer. Previous right mastectomy. Recent left breast lumpectomy. Patient needs Port-A-Cath for additional treatment. EXAM: FLUOROSCOPIC AND ULTRASOUND GUIDED PLACEMENT OF A SUBCUTANEOUS PORT COMPARISON:  None. MEDICATIONS: Ancef 2 g; The antibiotic was administered within an appropriate time interval prior to skin puncture.  ANESTHESIA/SEDATION: Versed 2.0 mg IV; Fentanyl 100 mcg IV; Moderate Sedation Time:  44 minutes The patient was continuously monitored during the procedure by the interventional radiology nurse under my direct supervision. FLUOROSCOPY TIME:  54 seconds, 2.2 mGy COMPLICATIONS: None immediate. PROCEDURE: The procedure, risks, benefits, and alternatives were explained to the patient. Questions regarding the procedure were encouraged and answered. The patient understands and consents to the procedure. Patient was placed supine on the interventional table. Ultrasound confirmed a patent right internal jugular vein. The right chest and neck were cleaned with a skin antiseptic and a sterile drape was placed. Maximal barrier sterile technique was utilized including caps, mask, sterile gowns, sterile gloves, sterile drape, hand hygiene and skin antiseptic. The right neck was anesthetized with 1% lidocaine. Small incision was made in the right neck with a blade. Micropuncture set was placed in the right internal jugular vein with ultrasound guidance. The micropuncture wire was used for measurement purposes. The right chest was anesthetized with 1% lidocaine with epinephrine. #15 blade was used to make an incision and a subcutaneous port pocket was formed. [place] was assembled. Subcutaneous tunnel was formed with a stiff tunneling device. The port catheter was brought through the subcutaneous tunnel. The port was placed in the subcutaneous pocket and sutured in place. The micropuncture set was exchanged for a peel-away sheath. The catheter was placed through the peel-away sheath and the tip was positioned at the superior cavoatrial junction. Catheter placement was confirmed with fluoroscopy. The port was accessed and flushed with heparinized saline. The port pocket was closed using two layers of absorbable sutures and Dermabond. The vein skin site was closed using a single layer of absorbable suture and Dermabond.  Sterile dressings were applied. Patient tolerated the procedure well without an immediate complication. Ultrasound and fluoroscopic images were taken and saved for this procedure. IMPRESSION: Placement of a right chest subcutaneous port device. Electronically Signed   By: Markus Daft M.D.   On: 09/13/2016 10:38      ASSESSMENT: 76 y.o. [place] woman   (1) status post right lumpectomy and sentinel lymph node  biopsy in September 2002 for multifocal breast carcinoma, triple negative.  Treated with CMF followed by radiation.   (2) Local recurrence in April 2004, status post right modified radical mastectomy with TRAM reconstruction for what proved to be a T1c N0, stage IA triple negative breast carcinoma.  Treated adjuvantly with paclitaxel and doxorubicin x4 in 2004.  Off treatment since August 2004 with no evidence of recurrence  (3) history of papillary thyroid cancer s/p thyroidectomy June 2007, s/p radioactive iodine July 2007  (4) status post left breast upper outer quadrant biopsy 08/21/2016 for a clinical T1b N0, stage IB invasive ductal carcinoma, grade 3, weakly estrogen receptor positive, progesterone receptor and HER-2 negative, with an MIB-1 of 80%.  (5) left lumpectomy and sentinel lymph node sampling 09/11/2016 found a pT1b pN0, stage IB invasive ductal carcinoma, grade 3, with negative margins.  (6) adjuvant chemotherapy consisting of carboplatin and gemcitabine given days 1 and 8 of each 21 day cycle 4 cycles, started 09/18/2016  (7) adjuvant radiation to follow  (8) genetics testing pending.  PLAN:  Patient tolerated her first cycle of chemotherapy well. We were ready to treat her today, but I am concerned that she may have possibly an infection in her left jaw area and I would like that to today be cleared before we proceed to treatment.  In addition her [place] today is a little bit on the low side at 1.3. We did not give her Neulasta with her first cycle but we will with  subsequent cycles, on day 8.  The plan is still to do 4 cycles of carbo/Gemzar, given days 1 and 8 of each 21 day cycle.  She knows to call for any problems that may develop before her next visit here, which will be in one week. She will start cycle 2 at that time.    Joe Tanney C    10/09/2016

## 2016-10-10 ENCOUNTER — Telehealth: Payer: Self-pay | Admitting: *Deleted

## 2016-10-15 ENCOUNTER — Other Ambulatory Visit: Payer: Self-pay | Admitting: Oncology

## 2016-10-15 NOTE — Progress Notes (Unsigned)
. [name]   DOB: 10-Feb-1941  MR#: [id number]  CSN#:[id number]  PCP: [name], MD Patient Care Team: [name], MD as PCP - General Takita Riecke, [name], MD as Consulting Physician (Oncology) [name], MD as Consulting Physician (General Surgery) [name], MD as Consulting Physician (Gastroenterology)   CHIEF COMPLAINT: Weakly estrogen receptor positive breast cancer  CURRENT TREATMENT: Adjuvant chemotherapy   INTERVAL HISTORY: [name] [name] returns today for follow-up and treatment of her weakly estrogen receptor positive left sided breast cancer. Today is day 1 cycle 2 of 4 planned cycles of carboplatin and gemcitabine which she receives on days 1 and 8 of each 21 day cycle.  Averill has developed significant pain in her left lower jaw. She does not have a fever but she very likely has an infection there and may need antibiotics. She is going to be seeing her oral surgeon today.  She did not receive Neulasta with her first cycle. We wanted to see how her counts held up. Accordingly her neutrophils are borderline today at 1.3. For all these reasons I think it would be prudent to postpone her treatment 1 week  REVIEW OF SYSTEMS: She tells me the premeds were worse than the chemotherapy. She receives spell and also drawn and dexamethasone. I'm going to drop the dexamethasone dose to 4 mg. Hopefully that will take care of the problem. On the plus side she has not lost her hair, has had no mouth sores, no unexplained fatigue, no rash, no peripheral neuropathy. Her port is working well. A detailed review of systems today was stable   BREAST CANCER HISTORY: From the recent summary note:  I last saw [name] [name] in 2013 when she was released from follow-up from her right breast cancer recurrence in 2004. She is status post right mastectomy and adjuvant chemotherapy for that triple negative tumor. More recently, on 07/16/2016 she underwent left screening mammography at  the [facility] on 07/16/2016. This found the breast density to be category B. There was a possible asymmetry in the left breast, and on 07/19/2016 she underwent left diagnostic mammography with tomography and left breast ultrasonography. This confirmed a well circumscribed oval mass at the 9:00 position of the left breast measuring approximately 0.8 cm. This was not palpable. On ultrasonography the mass measured 0.6 cm and it was located at the 9:00 radiant 4 cm from the nipple. The left axilla was sonographically benign.  On June 11 [name] underwent biopsy of the left breast mass in question and this showed (SAA [phone]) and invasive ductal carcinoma, grade 3, estrogen receptor 5% positive with weak staining intensity, progesterone receptor negative, with an MIB-1 of 80%, and HER-2 nonamplified, the signals ratio being 1.46 and the number per cell 1.90.  Her subsequent history is as detailed below.   PAST MEDICAL HISTORY: Past Medical History:  Diagnosis Date  . Allergic rhinitis   . Anxiety   . Breast cancer ([place]) hx 2004   recurrent 2006 DR Zayaan Kozak  . HTN (hypertension)   . Hyperlipidemia   . Hypothyroidism   . Personal history of chemotherapy 2002  . Personal history of radiation therapy 2002  . Psoriasis   . S/P thyroidectomy 07/2005   2 cm largest diameter (1 other tiny focus)/ i-131 rx 99 mci 08/2005  . Thyroid cancer ([place])    Papillary Stage 1 - Dr Loanne Drilling  . Vitamin B12 deficiency 2009  . Vitamin D deficiency 2009    PAST SURGICAL HISTORY: Past Surgical History:  Procedure Laterality Date  . BREAST LUMPECTOMY    . BREAST LUMPECTOMY WITH RADIOACTIVE SEED AND SENTINEL LYMPH NODE BIOPSY Left 09/11/2016   Procedure: LEFT BREAST LUMPECTOMY WITH RADIOACTIVE SEED AND LEFT AXILLARY SENTINEL LYMPH NODE BIOPSY WITH BLUE DYE INJECTION;  Surgeon: [name], MD;  Location: [facility];  Service: General;  Laterality: Left;  . IR FLUORO GUIDE PORT INSERTION RIGHT   09/13/2016  . IR US GUIDE VASC ACCESS RIGHT  09/13/2016  . MASTECTOMY     Right  . PORTACATH PLACEMENT N/A 09/11/2016   Procedure: ATTEMPTED INSERTION PORT-A-CATH WITH ULTRA SOUND GUIDANCE;  Surgeon: [name], MD;  Location: [facility];  Service: General;  Laterality: N/A;  . REDUCTION MAMMAPLASTY Left 2005  . THYROIDECTOMY  2007    FAMILY HISTORY Family History  Problem Relation Age of Onset  . Stroke Mother   . Allergies Mother   . Asthma Mother   . Clotting disorder Mother   . Heart disease Father 12       MI  . Allergies Sister   . Asthma Sister   . Cancer Sister        Breast  . Asthma Brother   . Allergies Daughter   . Asthma Daughter   . Allergies Sister   Family history includes a sister, a paternal aunt, and a paternal cousin with breast cancer all older then 29 at diagnosis  GYN HISTORY: Menarche age 76, first live birth age 76. She is GX P3. She went through the change of life age 76. She did not take hormone replacement.  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 Her husband [name] is retired from [employer]. Her children are [name], who works for time [employer] in [place], and [name], who lives in [place] and works for [employer]. The third child, [name], died in an automobile accident at age 23. The patient has one grandchild. 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 Smoking status: Never Smoker  . Smokeless tobacco: Never Used  . Alcohol use No     Colonoscopy:  PAP:  Bone density:  Lipid panel:  Allergies  Allergen Reactions  . Pneumococcal Vaccines Other (See Comments)    Was really sick   . Sulfa Antibiotics Other (See Comments)    Pt believes it was hives  . Sulfacetamide Sodium-Sulfur   . Tape Itching, Dermatitis and Rash  . Tegaderm Ag Mesh [Silver] Itching and Rash    Current Outpatient  Prescriptions  Medication Sig Dispense Refill  . ALPRAZolam (XANAX) 0.25 MG tablet Take 0.125-0.25 mg by mouth 2 (two) times daily as needed for anxiety or sleep (Pt takes a half of a tablet every evening).     . cholecalciferol (VITAMIN D) 1000 units tablet Take 1,000 Units by mouth daily.    [name] dexamethasone (DECADRON) 4 MG tablet Take 1 tablet twice a day with food AS NEEDED for nausea 30 tablet 1  . DULoxetine (CYMBALTA) 60 MG capsule Take 60 mg by mouth every morning.     [name] levothyroxine (SYNTHROID, LEVOTHROID) 112 MCG tablet TAKE 1 TABLET (112 MCG TOTAL) BY MOUTH DAILY. 90 tablet 3  . lidocaine-prilocaine (EMLA) cream Apply to affected area once 30 g 3  . losartan (COZAAR) 100 MG tablet TAKE 1 TABLET BY MOUTH EVERY DAY FOR BLOOD PRESSURE 90 tablet 1  . prochlorperazine (COMPAZINE)  10 MG tablet Take before meals and at bedtime day after chemo, then as needed for nausea 30 tablet 1  . rosuvastatin (CRESTOR) 20 MG tablet Take 1 tablet (20 mg total) by mouth daily. 90 tablet 2  . Syringe/Needle, Disp, (B-D ECLIPSE SYRINGE) 30G X 1/2" 1 ML MISC 1 each by Does not apply route 1 day or 1 dose. For Vit B12 inj 50 each 11  . vitamin B-12 (CYANOCOBALAMIN) 1000 MCG tablet Take 1,000 mcg by mouth every other day.     No current facility-administered medications for this visit.     OBJECTIVE: Older white woman Who appears stated age  There were no vitals filed for this visit.   There is no height or weight on file to calculate BMI.    ECOG FS: 1  Sclerae unicteric, EOMs intact Oropharynx clear and moist; no obvious abscess noted by exam No cervical or supraclavicular adenopathy Lungs no rales or rhonchi Heart regular rate and rhythm Abd soft, nontender, positive bowel sounds MSK no focal spinal tenderness, no upper extremity lymphedema Neuro: nonfocal, well oriented, appropriate affect Breasts: The left breast is status post lumpectomy. The incision continues to heal nicely. There is no  erythema, swelling, or dehiscence.  LAB RESULTS: Lab Results  Component Value Date   WBC 3.4 (L) 10/09/2016   NEUTROABS 1.3 (L) 10/09/2016   HGB 12.2 10/09/2016   HCT 35.8 10/09/2016   MCV 91.8 10/09/2016   PLT 441 (H) 10/09/2016      Chemistry      Component Value Date/Time   NA 141 10/09/2016 0951   K 3.8 10/09/2016 0951   CL 108 09/13/2016 0728   CL 104 10/09/2011 1403   CO2 25 10/09/2016 0951   BUN 21.8 10/09/2016 0951   CREATININE 1.1 10/09/2016 0951      Component Value Date/Time   CALCIUM 9.7 10/09/2016 0951   ALKPHOS 98 10/09/2016 0951   AST 26 10/09/2016 0951   ALT 25 10/09/2016 0951   BILITOT 0.59 10/09/2016 0951       Lab Results  Component Value Date   LABCA2 16 10/09/2011    No components found for: [id number]  No results for input(s): INR in the last 168 hours.  Urinalysis    Component Value Date/Time   COLORURINE YELLOW 06/05/2015 1559   APPEARANCEUR CLEAR 06/05/2015 1559   LABSPEC 1.020 06/05/2015 1559   PHURINE 5.5 06/05/2015 1559   GLUCOSEU NEGATIVE 06/05/2015 1559   HGBUR NEGATIVE 06/05/2015 1559   BILIRUBINUR NEGATIVE 06/05/2015 1559   KETONESUR NEGATIVE 06/05/2015 1559   UROBILINOGEN 0.2 06/05/2015 1559   NITRITE NEGATIVE 06/05/2015 1559   LEUKOCYTESUR MODERATE (A) 06/05/2015 1559    STUDIES: No results found.    ASSESSMENT: 76 y.o. [place] woman   (1) status post right lumpectomy and sentinel lymph node biopsy in September 2002 for multifocal breast carcinoma, triple negative.  Treated with CMF followed by radiation.   (2) Local recurrence in April 2004, status post right modified radical mastectomy with TRAM reconstruction for what proved to be a T1c N0, stage IA triple negative breast carcinoma.  Treated adjuvantly with paclitaxel and doxorubicin x4 in 2004.  Off treatment since August 2004 with no evidence of recurrence  (3) history of papillary thyroid cancer s/p thyroidectomy June 2007, s/p radioactive iodine July  2007  (4) status post left breast upper outer quadrant biopsy 08/21/2016 for a clinical T1b N0, stage IB invasive ductal carcinoma, grade 3, weakly estrogen receptor positive, progesterone receptor and  HER-2 negative, with an MIB-1 of 80%.  (5) left lumpectomy and sentinel lymph node sampling 09/11/2016 found a pT1b pN0, stage IB invasive ductal carcinoma, grade 3, with negative margins.  (6) adjuvant chemotherapy consisting of carboplatin and gemcitabine given days 1 and 8 of each 21 day cycle 4 cycles, started 09/18/2016  (7) adjuvant radiation to follow  (8) genetics testing pending.  PLAN:  Patient tolerated her first cycle of chemotherapy well. We were ready to treat her today, but I am concerned that she may have possibly an infection in her left jaw area and I would like that to today be cleared before we proceed to treatment.  In addition her Dwale today is a little bit on the low side at 1.3. We did not give her Neulasta with her first cycle but we will with subsequent cycles, on day 8.  The plan is still to do 4 cycles of carbo/Gemzar, given days 1 and 8 of each 21 day cycle.  She knows to call for any problems that may develop before her next visit here, which will be in one week. She will start cycle 2 at that time.    Aryani Daffern C    10/15/2016

## 2016-10-16 ENCOUNTER — Other Ambulatory Visit (HOSPITAL_BASED_OUTPATIENT_CLINIC_OR_DEPARTMENT_OTHER): Payer: Medicare Other

## 2016-10-16 ENCOUNTER — Ambulatory Visit: Payer: Medicare Other

## 2016-10-16 ENCOUNTER — Ambulatory Visit (HOSPITAL_BASED_OUTPATIENT_CLINIC_OR_DEPARTMENT_OTHER): Payer: Medicare Other | Admitting: Adult Health

## 2016-10-16 VITALS — BP 140/82 | HR 88 | Temp 97.5°F | Resp 18 | Ht 64.0 in | Wt 144.2 lb

## 2016-10-16 DIAGNOSIS — Z170 Estrogen receptor positive status [ER+]: Principal | ICD-10-CM

## 2016-10-16 DIAGNOSIS — C50412 Malignant neoplasm of upper-outer quadrant of left female breast: Secondary | ICD-10-CM

## 2016-10-16 DIAGNOSIS — D701 Agranulocytosis secondary to cancer chemotherapy: Secondary | ICD-10-CM | POA: Diagnosis not present

## 2016-10-16 DIAGNOSIS — C50012 Malignant neoplasm of nipple and areola, left female breast: Secondary | ICD-10-CM

## 2016-10-16 LAB — COMPREHENSIVE METABOLIC PANEL
ALT: 20 U/L (ref 0–55)
ANION GAP: 10 meq/L (ref 3–11)
AST: 27 U/L (ref 5–34)
Albumin: 3.8 g/dL (ref 3.5–5.0)
Alkaline Phosphatase: 116 U/L (ref 40–150)
BILIRUBIN TOTAL: 0.75 mg/dL (ref 0.20–1.20)
BUN: 11.6 mg/dL (ref 7.0–26.0)
CO2: 23 meq/L (ref 22–29)
Calcium: 9.4 mg/dL (ref 8.4–10.4)
Chloride: 106 mEq/L (ref 98–109)
Creatinine: 1 mg/dL (ref 0.6–1.1)
EGFR: 53 mL/min/{1.73_m2} — AB (ref >90)
GLUCOSE: 134 mg/dL (ref 70–140)
POTASSIUM: 3.7 meq/L (ref 3.5–5.1)
SODIUM: 139 meq/L (ref 136–145)
TOTAL PROTEIN: 7.4 g/dL (ref 6.4–8.3)

## 2016-10-16 LAB — CBC WITH DIFFERENTIAL/PLATELET
BASO%: 0.9 % (ref 0.0–2.0)
Basophils Absolute: 0 10*3/uL (ref 0.0–0.1)
EOS ABS: 0.1 10*3/uL (ref 0.0–0.5)
EOS%: 3.4 % (ref 0.0–7.0)
HCT: 38 % (ref 34.8–46.6)
HGB: 12.8 g/dL (ref 11.6–15.9)
LYMPH%: 49.5 % (ref 14.0–49.7)
MCH: 31.3 pg (ref 25.1–34.0)
MCHC: 33.8 g/dL (ref 31.5–36.0)
MCV: 92.7 fL (ref 79.5–101.0)
MONO#: 0.5 10*3/uL (ref 0.1–0.9)
MONO%: 16.1 % — AB (ref 0.0–14.0)
NEUT%: 30.1 % — AB (ref 38.4–76.8)
NEUTROS ABS: 1 10*3/uL — AB (ref 1.5–6.5)
PLATELETS: 453 10*3/uL — AB (ref 145–400)
RBC: 4.1 10*6/uL (ref 3.70–5.45)
RDW: 15.1 % — ABNORMAL HIGH (ref 11.2–14.5)
WBC: 3.4 10*3/uL — AB (ref 3.9–10.3)
lymph#: 1.7 10*3/uL (ref 0.9–3.3)

## 2016-10-16 NOTE — Progress Notes (Signed)
. [name]   DOB: 19-Feb-1940  MR#: [id number]  CSN#:[id number]  PCP: [name], MD Patient Care Team: [name], MD as PCP - General Magrinat, [name], MD as Consulting Physician (Oncology) [name], MD as Consulting Physician (General Surgery) [name], MD as Consulting Physician (Gastroenterology)   CHIEF COMPLAINT: Weakly estrogen receptor positive breast cancer  CURRENT TREATMENT: Adjuvant chemotherapy   INTERVAL HISTORY: [name] [name] returns today for follow-up and treatment of her weakly estrogen receptor positive left sided breast cancer. Today is day 1 cycle 2 of 4 planned cycles of carboplatin and gemcitabine which she receives on days 1 and 8 of each 21 day cycle.  [name] [name] was initially due for cycle 2 day 1 of chemotherapy last week.  She however was having jaw pain following a root canal.  She saw an endodontist and a panorex was sent to see a periodontist, and will need intense dental cleaning .  She did take an antibiotic Amoxicillin that she has been taking BID.    REVIEW OF SYSTEMS: [name] [name] is doing quite well today.  She denies any concerns today such as fever, chills, nausea, vomiting, pain, headache, bowel changes, skin changes, or any further concerns.  A detailed ROS is non contributory.  BREAST CANCER HISTORY: From the recent summary note:  I last saw [name] [name] in 2013 when she was released from follow-up from her right breast cancer recurrence in 2004. She is status post right mastectomy and adjuvant chemotherapy for that triple negative tumor. More recently, on 07/16/2016 she underwent left screening mammography at the [facility] on 07/16/2016. This found the breast density to be category B. There was a possible asymmetry in the left breast, and on 07/19/2016 she underwent left diagnostic mammography with tomography and left breast ultrasonography. This confirmed a well circumscribed oval mass at the 9:00 position of the left breast  measuring approximately 0.8 cm. This was not palpable. On ultrasonography the mass measured 0.6 cm and it was located at the 9:00 radiant 4 cm from the nipple. The left axilla was sonographically benign.  On June 11 [name] [name] underwent biopsy of the left breast mass in question and this showed (SAA [phone]) and invasive ductal carcinoma, grade 3, estrogen receptor 5% positive with weak staining intensity, progesterone receptor negative, with an MIB-1 of 80%, and HER-2 nonamplified, the signals ratio being 1.46 and the number per cell 1.90.  Her subsequent history is as detailed below.   PAST MEDICAL HISTORY: Past Medical History:  Diagnosis Date  . Allergic rhinitis   . Anxiety   . Breast cancer ([name]) hx 2004   recurrent 2006 DR Magrinat  . HTN (hypertension)   . Hyperlipidemia   . Hypothyroidism   . Personal history of chemotherapy 2002  . Personal history of radiation therapy 2002  . Psoriasis   . S/P thyroidectomy 07/2005   2 cm largest diameter (1 other tiny focus)/ i-131 rx 99 mci 08/2005  . Thyroid cancer ([facility])    Papillary Stage 1 - Dr Loanne Drilling  . Vitamin B12 deficiency 2009  . Vitamin D deficiency 2009    PAST SURGICAL HISTORY: Past Surgical History:  Procedure Laterality Date  . BREAST LUMPECTOMY    . BREAST LUMPECTOMY WITH RADIOACTIVE SEED AND SENTINEL LYMPH NODE BIOPSY Left 09/11/2016   Procedure: LEFT BREAST LUMPECTOMY WITH RADIOACTIVE SEED AND LEFT AXILLARY SENTINEL LYMPH NODE BIOPSY WITH BLUE DYE INJECTION;  Surgeon: [name], MD;  Location: [facility];  Service: General;  Laterality: Left;  . IR FLUORO GUIDE PORT INSERTION RIGHT  09/13/2016  . IR US GUIDE VASC ACCESS RIGHT  09/13/2016  . MASTECTOMY     Right  . PORTACATH PLACEMENT N/A 09/11/2016   Procedure: ATTEMPTED INSERTION PORT-A-CATH WITH ULTRA SOUND GUIDANCE;  Surgeon: [name], MD;  Location: [facility];  Service: General;  Laterality: N/A;  . REDUCTION MAMMAPLASTY Left 2005   . THYROIDECTOMY  2007    FAMILY HISTORY Family History  Problem Relation Age of Onset  . Stroke Mother   . Allergies Mother   . Asthma Mother   . Clotting disorder Mother   . Heart disease Father 64       MI  . Allergies Sister   . Asthma Sister   . Cancer Sister        Breast  . Asthma Brother   . Allergies Daughter   . Asthma Daughter   . Allergies Sister   Family history includes a sister, a paternal aunt, and a paternal cousin with breast cancer all older then 31 at diagnosis  GYN HISTORY: Menarche age 63, first live birth age 59. She is GX P3. She went through the change of life age 45. She did not take hormone replacement.  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 Her husband [name] is retired from [employer]. Her children are [name], who works for time [employer] in [place], and [place], who lives in [place] and works for [employer]. The third child, [name], died in an automobile accident at age 72. The patient has one grandchild. 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 Smoking status: Never Smoker  . Smokeless tobacco: Never Used  . Alcohol use No     Colonoscopy:  PAP:  Bone density:  Lipid panel:  Allergies  Allergen Reactions  . Pneumococcal Vaccines Other (See Comments)    Was really sick   . Sulfa Antibiotics Other (See Comments)    Pt believes it was hives  . Sulfacetamide Sodium-Sulfur   . Tape Itching, Dermatitis and Rash  . Tegaderm Ag Mesh [Silver] Itching and Rash    Current Outpatient Prescriptions  Medication Sig Dispense Refill  . ALPRAZolam (XANAX) 0.25 MG tablet Take 0.125-0.25 mg by mouth 2 (two) times daily as needed for anxiety or sleep (Pt takes a half of a tablet every evening).     [name] amoxicillin (AMOXIL) 500 MG tablet Take 500 mg by mouth 1 day or 1 dose.    . cholecalciferol (VITAMIN D) 1000 units  tablet Take 1,000 Units by mouth daily.    [name] dexamethasone (DECADRON) 4 MG tablet Take 1 tablet twice a day with food AS NEEDED for nausea 30 tablet 1  . DULoxetine (CYMBALTA) 60 MG capsule Take 60 mg by mouth every morning.     [name] levothyroxine (SYNTHROID, LEVOTHROID) 112 MCG tablet TAKE 1 TABLET (112 MCG TOTAL) BY MOUTH DAILY. 90 tablet 3  . lidocaine-prilocaine (EMLA) cream Apply to affected area once 30 g 3  . losartan (COZAAR) 100 MG tablet TAKE 1 TABLET BY MOUTH EVERY DAY FOR BLOOD PRESSURE 90 tablet 1  . prochlorperazine (COMPAZINE) 10 MG tablet Take before meals and at bedtime day after chemo, then as needed for nausea 30 tablet 1  . rosuvastatin (CRESTOR) 20 MG tablet Take 1 tablet (20 mg total) by mouth daily. 90 tablet 2  .  Syringe/Needle, Disp, (B-D ECLIPSE SYRINGE) 30G X 1/2" 1 ML MISC 1 each by Does not apply route 1 day or 1 dose. For Vit B12 inj 50 each 11  . vitamin B-12 (CYANOCOBALAMIN) 1000 MCG tablet Take 1,000 mcg by mouth every other day.     No current facility-administered medications for this visit.     OBJECTIVE:   Vitals:   10/16/16 1149  BP: (!) 175/106  Pulse: 88  Resp: 18  Temp: (!) 97.5 F (36.4 C)  SpO2: 100%     Body mass index is 24.75 kg/m.    ECOG FS: 1  GENERAL: Patient is a well appearing female in no acute distress HEENT:  Sclerae anicteric. PERRL  Oropharynx clear and moist. No ulcerations or evidence of oropharyngeal candidiasis. Neck is supple. There is no swelling noted along her left lower mandible or tenderness noted to palpation NODES:  No cervical, supraclavicular, or axillary lymphadenopathy palpated.  BREAST EXAM:  Deferred. LUNGS:  Clear to auscultation bilaterally.  No wheezes or rhonchi. HEART:  Regular rate and rhythm. No murmur appreciated. ABDOMEN:  Soft, nontender.  Positive, normoactive bowel sounds. No organomegaly palpated. MSK:  No focal spinal tenderness to palpation. Full range of motion bilaterally in the upper  extremities. EXTREMITIES:  No peripheral edema.   SKIN:  Clear with no obvious rashes or skin changes. No nail dyscrasia. NEURO:  Nonfocal. Well oriented.  Appropriate affect.      LAB RESULTS: Lab Results  Component Value Date   WBC 3.4 (L) 10/09/2016   NEUTROABS 1.3 (L) 10/09/2016   HGB 12.2 10/09/2016   HCT 35.8 10/09/2016   MCV 91.8 10/09/2016   PLT 441 (H) 10/09/2016      Chemistry      Component Value Date/Time   NA 141 10/09/2016 0951   K 3.8 10/09/2016 0951   CL 108 09/13/2016 0728   CL 104 10/09/2011 1403   CO2 25 10/09/2016 0951   BUN 21.8 10/09/2016 0951   CREATININE 1.1 10/09/2016 0951      Component Value Date/Time   CALCIUM 9.7 10/09/2016 0951   ALKPHOS 98 10/09/2016 0951   AST 26 10/09/2016 0951   ALT 25 10/09/2016 0951   BILITOT 0.59 10/09/2016 0951       Lab Results  Component Value Date   LABCA2 16 10/09/2011    No components found for: [id number]  No results for input(s): INR in the last 168 hours.  Urinalysis    Component Value Date/Time   COLORURINE YELLOW 06/05/2015 1559   APPEARANCEUR CLEAR 06/05/2015 1559   LABSPEC 1.020 06/05/2015 1559   PHURINE 5.5 06/05/2015 1559   GLUCOSEU NEGATIVE 06/05/2015 1559   HGBUR NEGATIVE 06/05/2015 1559   BILIRUBINUR NEGATIVE 06/05/2015 1559   KETONESUR NEGATIVE 06/05/2015 1559   UROBILINOGEN 0.2 06/05/2015 1559   NITRITE NEGATIVE 06/05/2015 1559   LEUKOCYTESUR MODERATE (A) 06/05/2015 1559    STUDIES: No results found.    ASSESSMENT: 76 y.o. [name] [name] woman   (1) status post right lumpectomy and sentinel lymph node biopsy in September 2002 for multifocal breast carcinoma, triple negative.  Treated with CMF followed by radiation.   (2) Local recurrence in April 2004, status post right modified radical mastectomy with TRAM reconstruction for what proved to be a T1c N0, stage IA triple negative breast carcinoma.  Treated adjuvantly with paclitaxel and doxorubicin x4 in 2004.  Off treatment  since August 2004 with no evidence of recurrence  (3) history of papillary thyroid cancer s/p thyroidectomy  June 2007, s/p radioactive iodine July 2007  (4) status post left breast upper outer quadrant biopsy 08/21/2016 for a clinical T1b N0, stage IB invasive ductal carcinoma, grade 3, weakly estrogen receptor positive, progesterone receptor and HER-2 negative, with an MIB-1 of 80%.  (5) left lumpectomy and sentinel lymph node sampling 09/11/2016 found a pT1b pN0, stage IB invasive ductal carcinoma, grade 3, with negative margins.  (6) adjuvant chemotherapy consisting of carboplatin and gemcitabine given days 1 and 8 of each 21 day cycle 4 cycles, started 09/18/2016 (Neupogen on days 2 and 3, and Onpro on day 8 for chemotherapy induced neutropenia)  (7) adjuvant radiation to follow  (8) genetics testing pending.  PLAN:  Regine is doing well today.  Her jaw pain has resolved and I instructed her to finish the Amoxicillin that her dentist prescribed.  I reviewed her case with Dr. Jana Hakim.  Her ANC is 1 today.  Due to this, she will not receive chemotherapy today.  She will wait one week and return for labs, follow up, and day 1 cycle 2 of Gemcitabine/Carboplatin.  In the future, she will receive Neupogen on days 2 and 3 of treatment and Neulasta (ordered as Onpro) on day 8.  I went ahead and ordered these and requested scheduling as necessary.    She knows to call us between now and her next appointment for any questions or concerns whatsoever.    Scot Dock    10/16/2016    ADDENDUM: Daily is tolerating treatment well. The problem we are having his low counts. She is going to need to have Neupogen on days 2 and 3 of each cycle and then Neulasta with day 9. This was discussed today.  She understands this will change the date when it would be best for her to have her dental surgery. She is having second thoughts about that in any case but if she does want to proceed with that she will  have to have it rescheduled.  I personally saw this patient and performed a substantive portion of this encounter with the listed APP documented above.   Chauncey Cruel, MD Medical Oncology and Hematology [facility] [address] [place], [place] [address] Tel. [phone]    Fax. [phone]

## 2016-10-23 ENCOUNTER — Ambulatory Visit (HOSPITAL_BASED_OUTPATIENT_CLINIC_OR_DEPARTMENT_OTHER): Payer: Medicare Other | Admitting: Oncology

## 2016-10-23 ENCOUNTER — Ambulatory Visit: Payer: Medicare Other

## 2016-10-23 ENCOUNTER — Ambulatory Visit (HOSPITAL_BASED_OUTPATIENT_CLINIC_OR_DEPARTMENT_OTHER): Payer: Medicare Other

## 2016-10-23 ENCOUNTER — Other Ambulatory Visit (HOSPITAL_BASED_OUTPATIENT_CLINIC_OR_DEPARTMENT_OTHER): Payer: Medicare Other

## 2016-10-23 ENCOUNTER — Telehealth: Payer: Self-pay | Admitting: Oncology

## 2016-10-23 VITALS — BP 132/89 | HR 82 | Temp 97.8°F | Resp 18 | Ht 64.0 in | Wt 142.9 lb

## 2016-10-23 DIAGNOSIS — Z170 Estrogen receptor positive status [ER+]: Secondary | ICD-10-CM

## 2016-10-23 DIAGNOSIS — C50412 Malignant neoplasm of upper-outer quadrant of left female breast: Secondary | ICD-10-CM

## 2016-10-23 DIAGNOSIS — Z5111 Encounter for antineoplastic chemotherapy: Secondary | ICD-10-CM | POA: Insufficient documentation

## 2016-10-23 DIAGNOSIS — Z95828 Presence of other vascular implants and grafts: Secondary | ICD-10-CM

## 2016-10-23 DIAGNOSIS — C50012 Malignant neoplasm of nipple and areola, left female breast: Secondary | ICD-10-CM

## 2016-10-23 LAB — COMPREHENSIVE METABOLIC PANEL
ALT: 15 U/L (ref 0–55)
ANION GAP: 10 meq/L (ref 3–11)
AST: 22 U/L (ref 5–34)
Albumin: 3.7 g/dL (ref 3.5–5.0)
Alkaline Phosphatase: 110 U/L (ref 40–150)
BILIRUBIN TOTAL: 0.77 mg/dL (ref 0.20–1.20)
BUN: 17.9 mg/dL (ref 7.0–26.0)
CHLORIDE: 107 meq/L (ref 98–109)
CO2: 22 meq/L (ref 22–29)
CREATININE: 1 mg/dL (ref 0.6–1.1)
Calcium: 9.3 mg/dL (ref 8.4–10.4)
EGFR: 52 mL/min/{1.73_m2} — ABNORMAL LOW (ref >90)
GLUCOSE: 93 mg/dL (ref 70–140)
Potassium: 3.6 mEq/L (ref 3.5–5.1)
SODIUM: 139 meq/L (ref 136–145)
TOTAL PROTEIN: 7 g/dL (ref 6.4–8.3)

## 2016-10-23 LAB — CBC WITH DIFFERENTIAL/PLATELET
BASO%: 1 % (ref 0.0–2.0)
Basophils Absolute: 0 10*3/uL (ref 0.0–0.1)
EOS%: 2.2 % (ref 0.0–7.0)
Eosinophils Absolute: 0.1 10*3/uL (ref 0.0–0.5)
HCT: 36.5 % (ref 34.8–46.6)
HGB: 12.4 g/dL (ref 11.6–15.9)
LYMPH%: 46.8 % (ref 14.0–49.7)
MCH: 31.6 pg (ref 25.1–34.0)
MCHC: 33.9 g/dL (ref 31.5–36.0)
MCV: 93.2 fL (ref 79.5–101.0)
MONO#: 0.6 10*3/uL (ref 0.1–0.9)
MONO%: 13.2 % (ref 0.0–14.0)
NEUT%: 36.8 % — AB (ref 38.4–76.8)
NEUTROS ABS: 1.6 10*3/uL (ref 1.5–6.5)
PLATELETS: 272 10*3/uL (ref 145–400)
RBC: 3.92 10*6/uL (ref 3.70–5.45)
RDW: 14.8 % — ABNORMAL HIGH (ref 11.2–14.5)
WBC: 4.5 10*3/uL (ref 3.9–10.3)
lymph#: 2.1 10*3/uL (ref 0.9–3.3)

## 2016-10-23 MED ORDER — PALONOSETRON HCL INJECTION 0.25 MG/5ML
INTRAVENOUS | Status: AC
  Filled 2016-10-23: qty 5

## 2016-10-23 MED ORDER — SODIUM CHLORIDE 0.9% FLUSH
10.0000 mL | INTRAVENOUS | Status: DC | PRN
  Administered 2016-10-23: 10 mL
  Filled 2016-10-23: qty 10

## 2016-10-23 MED ORDER — HEPARIN SOD (PORK) LOCK FLUSH 100 UNIT/ML IV SOLN
500.0000 [IU] | Freq: Once | INTRAVENOUS | Status: AC | PRN
  Administered 2016-10-23: 500 [IU]
  Filled 2016-10-23: qty 5

## 2016-10-23 MED ORDER — SODIUM CHLORIDE 0.9% FLUSH
10.0000 mL | Freq: Once | INTRAVENOUS | Status: AC
Start: 2016-10-23 — End: 2016-10-23
  Administered 2016-10-23: 10 mL
  Filled 2016-10-23: qty 10

## 2016-10-23 MED ORDER — SODIUM CHLORIDE 0.9 % IV SOLN
800.0000 mg/m2 | Freq: Once | INTRAVENOUS | Status: AC
  Administered 2016-10-23: 1368 mg via INTRAVENOUS
  Filled 2016-10-23: qty 35.98

## 2016-10-23 MED ORDER — SODIUM CHLORIDE 0.9 % IV SOLN
150.0000 mg | Freq: Once | INTRAVENOUS | Status: AC
  Administered 2016-10-23: 150 mg via INTRAVENOUS
  Filled 2016-10-23: qty 15

## 2016-10-23 MED ORDER — SODIUM CHLORIDE 0.9 % IV SOLN
Freq: Once | INTRAVENOUS | Status: AC
  Administered 2016-10-23: 15:00:00 via INTRAVENOUS

## 2016-10-23 MED ORDER — PALONOSETRON HCL INJECTION 0.25 MG/5ML
0.2500 mg | Freq: Once | INTRAVENOUS | Status: AC
  Administered 2016-10-23: 0.25 mg via INTRAVENOUS

## 2016-10-23 MED ORDER — DEXAMETHASONE SODIUM PHOSPHATE 10 MG/ML IJ SOLN
INTRAMUSCULAR | Status: AC
  Filled 2016-10-23: qty 1

## 2016-10-23 MED ORDER — DEXAMETHASONE SODIUM PHOSPHATE 10 MG/ML IJ SOLN
2.0000 mg | Freq: Once | INTRAMUSCULAR | Status: AC
  Administered 2016-10-23: 2 mg via INTRAVENOUS

## 2016-10-23 NOTE — Assessment & Plan Note (Signed)
This is a 76 y.o. [place] woman with  (1) status post right lumpectomy and sentinel lymph node biopsy in September 2002 for multifocal breast carcinoma, triple negative.  Treated with CMF followed by radiation.   (2) Local recurrence in April 2004, status post right modified radical mastectomy with TRAM reconstruction for what proved to be a T1c N0, stage IA triple negative breast carcinoma.  Treated adjuvantly with paclitaxel and doxorubicin x4 in 2004.  Off treatment since August 2004 with no evidence of recurrence  (3) history of papillary thyroid cancer s/p thyroidectomy June 2007, s/p radioactive iodine July 2007  (4) status post left breast upper outer quadrant biopsy 08/21/2016 for a clinical T1b N0, stage IB invasive ductal carcinoma, grade 3, weakly estrogen receptor positive, progesterone receptor and HER-2 negative, with an MIB-1 of 80%.  (5) left lumpectomy and sentinel lymph node sampling 09/11/2016 found a pT1b pN0, stage IB invasive ductal carcinoma, grade 3, with negative margins.  (6) adjuvant chemotherapy consisting of carboplatin and gemcitabine given days 1 and 8 of each 21 day cycle 4 cycles, started 09/18/2016 (Neupogen on days 2 and 3, and Onpro on day 8 for chemotherapy induced neutropenia)  (7) adjuvant radiation to follow  (8) genetics testing pending.  PLAN:  Dorien is doing well today.  ANC is adequate to proceed with treatment today. Recommend that she proceed with cycle 2 day 1 of her chemotherapy as scheduled today. She will receive Neupogen on days 2 and 3 of treatment and Neulasta (ordered as Onpro) on day 8.   We discussed her potential procedure on Monday. I have encouraged her to call her periodontist's office and discuss the timing of her procedure with them. She was also encouraged to make them aware that she is receiving chemotherapy today and is scheduled for her next dose of chemotherapy next week. A copy of her blood work has been given to  her to bring to her periodontist that they can determine the best date to proceed.  Follow-up will be in one week for repeat labs and an evaluation prior to cycle 2 day 8.  She knows to call us between now and her next appointment for any questions or concerns whatsoever.

## 2016-10-23 NOTE — Progress Notes (Signed)
Arial Cancer Follow up:    Plotnikov, [name] Lacks, MD [place] [address]   DIAGNOSIS: Cancer Staging Malignant neoplasm of upper-outer quadrant of left breast in female, estrogen receptor positive ([place]) Staging form: Breast, AJCC 8th Edition - Pathologic: Stage IB (pT1b, pN0, cM0, G3, ER: Negative, PR: Negative, HER2: Negative) - Unsigned   SUMMARY OF ONCOLOGIC HISTORY:  No history exists.    CURRENT THERAPY: adjuvant chemotherapy  INTERVAL HISTORY: [name] [name] 76 y.o. female returns for routine follow-up by herself. Today is cycle 2 day 1 of 4 planned cycles of carboplatin and gemcitabine which she receives on day 1 and day 8 of each 21 day cycle. Chemotherapy was held last week due to neutropenia. The patient is feeling fine today. She remains on amoxicillin prescribed by her periodontist. She states she has 2-3 days left of this medication. The patient reports that she is due to have a procedure at their office early next week.Her jaw pain has resolved since being on antibiotic. She is questioning whether not she should proceed with this procedure this coming Monday or not. The patient is here today for evaluation prior to cycle 2 day 1 of her chemotherapy.   Patient Active Problem List   Diagnosis Date Noted  . Port catheter in place 10/23/2016  . Encounter for antineoplastic chemotherapy 10/23/2016  . Malignant neoplasm of upper-outer quadrant of left breast in female, estrogen receptor positive ([place]) 07/29/2016  . Well adult exam 06/05/2015  . Neoplasm of uncertain behavior of skin 06/05/2015  . Adenopathy, cervical 06/05/2015  . Tachycardia 06/07/2014  . Fatigue 06/07/2014  . Wart viral 10/18/2011  . Hyperglycemia 11/06/2010  . Actinic keratoses 05/25/2010  . Essential hypertension 02/23/2010  . BREAST MASS 12/12/2009  . SHOULDER PAIN 11/22/2009  . Chronic fatigue 11/08/2009  . RLQ PAIN 11/08/2009  . VERTIGO 07/11/2009  . ECZEMA  10/19/2008  . CT, CHEST, ABNORMAL 05/30/2008  . STYE 02/17/2008  . B12 deficiency 12/16/2007  . Vitamin D deficiency 12/16/2007  . HYPOTHYROIDISM, POSTSURGICAL 12/01/2007  . Acute maxillary sinusitis 11/11/2007  . ALLERGIC RESPIRATORY DISEASE, EXTRINSIC 11/11/2007  . THYROID CANCER 08/19/2007  . Dyslipidemia 08/19/2007  . ANXIETY 08/19/2007  . DEPRESSION 08/19/2007  . ALLERGIC RHINITIS 08/19/2007  . BREAST CANCER, HX OF 08/19/2007    is allergic to pneumococcal vaccines; sulfa antibiotics; sulfacetamide sodium-sulfur; tape; and tegaderm ag mesh [silver].  MEDICAL HISTORY: Past Medical History:  Diagnosis Date  . Allergic rhinitis   . Anxiety   . Breast cancer ([place]) hx 2004   recurrent 2006 DR Magrinat  . HTN (hypertension)   . Hyperlipidemia   . Hypothyroidism   . Personal history of chemotherapy 2002  . Personal history of radiation therapy 2002  . Psoriasis   . S/P thyroidectomy 07/2005   2 cm largest diameter (1 other tiny focus)/ i-131 rx 99 [facility] 08/2005  . Thyroid cancer ([place])    Papillary Stage 1 - Dr Loanne Drilling  . Vitamin B12 deficiency 2009  . Vitamin D deficiency 2009    SURGICAL HISTORY: Past Surgical History:  Procedure Laterality Date  . BREAST LUMPECTOMY    . BREAST LUMPECTOMY WITH RADIOACTIVE SEED AND SENTINEL LYMPH NODE BIOPSY Left 09/11/2016   Procedure: LEFT BREAST LUMPECTOMY WITH RADIOACTIVE SEED AND LEFT AXILLARY SENTINEL LYMPH NODE BIOPSY WITH BLUE DYE INJECTION;  Surgeon: [name], MD;  Location: [facility];  Service: General;  Laterality: Left;  . IR FLUORO GUIDE PORT INSERTION  RIGHT  09/13/2016  . IR US GUIDE VASC ACCESS RIGHT  09/13/2016  . MASTECTOMY     Right  . PORTACATH PLACEMENT N/A 09/11/2016   Procedure: ATTEMPTED INSERTION PORT-A-CATH WITH ULTRA SOUND GUIDANCE;  Surgeon: [name], MD;  Location: [facility];  Service: General;  Laterality: N/A;  . REDUCTION MAMMAPLASTY Left 2005  . THYROIDECTOMY  2007     SOCIAL HISTORY: Social History   Social History  . Marital status: Married    Spouse name: N/A  . Number of children: 2  . Years of education: N/A   Occupational History  . 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 Smoking status: Never Smoker  . Smokeless tobacco: Never Used  . Alcohol use No  . Drug use: No  . Sexual activity: Not Currently   Other Topics Concern  . Not on file   Social History Narrative   GI - Dr Earlean Shawl   Depression - Dr Toy Care   GYN - Dr Marianna Payment          FAMILY HISTORY: Family History  Problem Relation Age of Onset  . Stroke Mother   . Allergies Mother   . Asthma Mother   . Clotting disorder Mother   . Heart disease Father 22       MI  . Allergies Sister   . Asthma Sister   . Cancer Sister        Breast  . Asthma Brother   . Allergies Daughter   . Asthma Daughter   . Allergies Sister     Review of Systems  Constitutional: Negative.   HENT:  Negative.   Eyes: Negative.   Respiratory: Negative.   Cardiovascular: Negative.   Gastrointestinal: Negative.   Genitourinary: Negative.    Musculoskeletal: Negative.   Skin: Negative.   Neurological: Negative.   Hematological: Negative.   Psychiatric/Behavioral: Negative.       PHYSICAL EXAMINATION  ECOG PERFORMANCE STATUS: 1 - Symptomatic but completely ambulatory  Vitals:   10/23/16 1328  BP: 132/89  Pulse: 82  Resp: 18  Temp: 97.8 F (36.6 C)  SpO2: 99%    Physical Exam  Constitutional: She is oriented to person, place, and time and well-developed, well-nourished, and in no distress. No distress.  HENT:  Head: Normocephalic.  Mouth/Throat: Oropharynx is clear and moist. No oropharyngeal exudate.  Eyes: Conjunctivae are normal. No scleral icterus.  Neck: Normal range of motion. Neck supple.  Cardiovascular: Normal rate, regular rhythm, normal heart sounds and intact distal pulses.   Pulmonary/Chest: Effort normal and  breath sounds normal. No respiratory distress. She has no wheezes. She has no rales.  Abdominal: Soft. Bowel sounds are normal. She exhibits no distension and no mass. There is no tenderness.  Musculoskeletal: Normal range of motion. She exhibits no edema.  Lymphadenopathy:    She has no cervical adenopathy.  Neurological: She is alert and oriented to person, place, and time. She exhibits normal muscle tone. Gait normal.  Skin: Skin is warm and dry. No rash noted. She is not diaphoretic. No erythema. No pallor.  Psychiatric: Mood, memory, affect and judgment normal.  Vitals reviewed.   LABORATORY DATA:  CBC    Component Value Date/Time   WBC 4.5 10/23/2016 1240   WBC 8.0 09/13/2016 0728   RBC 3.92 10/23/2016 1240   RBC 3.86 (L) 09/13/2016 0728   HGB 12.4 10/23/2016 1240   HCT 36.5 10/23/2016 1240  PLT 272 10/23/2016 1240   MCV 93.2 10/23/2016 1240   MCH 31.6 10/23/2016 1240   MCH 29.5 09/13/2016 0728   MCHC 33.9 10/23/2016 1240   MCHC 32.6 09/13/2016 0728   RDW 14.8 (H) 10/23/2016 1240   LYMPHSABS 2.1 10/23/2016 1240   MONOABS 0.6 10/23/2016 1240   EOSABS 0.1 10/23/2016 1240   BASOSABS 0.0 10/23/2016 1240    CMP     Component Value Date/Time   NA 139 10/23/2016 1240   K 3.6 10/23/2016 1240   CL 108 09/13/2016 0728   CL 104 10/09/2011 1403   CO2 22 10/23/2016 1240   GLUCOSE 93 10/23/2016 1240   GLUCOSE 113 (H) 10/09/2011 1403   BUN 17.9 10/23/2016 1240   CREATININE 1.0 10/23/2016 1240   CALCIUM 9.3 10/23/2016 1240   PROT 7.0 10/23/2016 1240   ALBUMIN 3.7 10/23/2016 1240   AST 22 10/23/2016 1240   ALT 15 10/23/2016 1240   ALKPHOS 110 10/23/2016 1240   BILITOT 0.77 10/23/2016 1240   GFRNONAA 50 (L) 09/13/2016 0728   GFRAA 58 (L) 09/13/2016 0728    RADIOGRAPHIC STUDIES:  No results found.  ASSESSMENT and THERAPY PLAN:   Malignant neoplasm of upper-outer quadrant of left breast in female, estrogen receptor positive ([place]) This is a 76 y.o. [place] woman  with  (1) status post right lumpectomy and sentinel lymph node biopsy in September 2002 for multifocal breast carcinoma, triple negative.  Treated with CMF followed by radiation.   (2) Local recurrence in April 2004, status post right modified radical mastectomy with TRAM reconstruction for what proved to be a T1c N0, stage IA triple negative breast carcinoma.  Treated adjuvantly with paclitaxel and doxorubicin x4 in 2004.  Off treatment since August 2004 with no evidence of recurrence  (3) history of papillary thyroid cancer s/p thyroidectomy June 2007, s/p radioactive iodine July 2007  (4) status post left breast upper outer quadrant biopsy 08/21/2016 for a clinical T1b N0, stage IB invasive ductal carcinoma, grade 3, weakly estrogen receptor positive, progesterone receptor and HER-2 negative, with an MIB-1 of 80%.  (5) left lumpectomy and sentinel lymph node sampling 09/11/2016 found a pT1b pN0, stage IB invasive ductal carcinoma, grade 3, with negative margins.  (6) adjuvant chemotherapy consisting of carboplatin and gemcitabine given days 1 and 8 of each 21 day cycle 4 cycles, started 09/18/2016 (Neupogen on days 2 and 3, and Onpro on day 8 for chemotherapy induced neutropenia)  (7) adjuvant radiation to follow  (8) genetics testing pending.  PLAN:  Tamalyn is doing well today.  ANC is adequate to proceed with treatment today. Recommend that she proceed with cycle 2 day 1 of her chemotherapy as scheduled today. She will receive Neupogen on days 2 and 3 of treatment and Neulasta (ordered as Onpro) on day 8.   We discussed her potential procedure on Monday. I have encouraged her to call her periodontist's office and discuss the timing of her procedure with them. She was also encouraged to make them aware that she is receiving chemotherapy today and is scheduled for her next dose of chemotherapy next week. A copy of her blood work has been given to her to bring to her periodontist that  they can determine the best date to proceed.  Follow-up will be in one week for repeat labs and an evaluation prior to cycle 2 day 8.  She knows to call us between now and her next appointment for any questions or concerns whatsoever.  No orders of the defined types were placed in this encounter.   All questions were answered. The patient knows to call the clinic with any problems, questions or concerns. We can certainly see the patient much sooner if necessary.  Mikey Bussing, NP 10/23/2016

## 2016-10-23 NOTE — Telephone Encounter (Signed)
No 9/12 los.  

## 2016-10-23 NOTE — Patient Instructions (Signed)
[facility] Discharge Instructions for Patients Receiving Chemotherapy  Today you received the following chemotherapy agents Carboplatin/ Gemzar  To help prevent nausea and vomiting after your treatment, we encourage you to take your nausea medication as directed by your MD.   If you develop nausea and vomiting that is not controlled by your nausea medication, call the clinic.   BELOW ARE SYMPTOMS THAT SHOULD BE REPORTED IMMEDIATELY:  *FEVER GREATER THAN 100.5 F  *CHILLS WITH OR WITHOUT FEVER  NAUSEA AND VOMITING THAT IS NOT CONTROLLED WITH YOUR NAUSEA MEDICATION  *UNUSUAL SHORTNESS OF BREATH  *UNUSUAL BRUISING OR BLEEDING  TENDERNESS IN MOUTH AND THROAT WITH OR WITHOUT PRESENCE OF ULCERS  *URINARY PROBLEMS  *BOWEL PROBLEMS  UNUSUAL RASH Items with * indicate a potential emergency and should be followed up as soon as possible.  Feel free to call the clinic you have any questions or concerns. The clinic phone number is [phone].  Please show the [place] at check-in to the Emergency Department and triage nurse.  Carboplatin injection What is this medicine? CARBOPLATIN (KAR boe pla tin) is a chemotherapy drug. It targets fast dividing cells, like cancer cells, and causes these cells to die. This medicine is used to treat ovarian cancer and many other cancers. This medicine may be used for other purposes; ask your health care provider or pharmacist if you have questions. COMMON BRAND NAME(S): Paraplatin What should I tell my health care provider before I take this medicine? They need to know if you have any of these conditions: -blood disorders -hearing problems -kidney disease -recent or ongoing radiation therapy -an unusual or allergic reaction to carboplatin, cisplatin, other chemotherapy, other medicines, foods, dyes, or preservatives -pregnant or trying to get pregnant -breast-feeding How should I use this medicine? This drug is usually  given as an infusion into a vein. It is administered in a hospital or clinic by a specially trained health care professional. Talk to your pediatrician regarding the use of this medicine in children. Special care may be needed. Overdosage: If you think you have taken too much of this medicine contact a poison control center or emergency room at once. NOTE: This medicine is only for you. Do not share this medicine with others. What if I miss a dose? It is important not to miss a dose. Call your doctor or health care professional if you are unable to keep an appointment. What may interact with this medicine? -medicines for seizures -medicines to increase blood counts like filgrastim, pegfilgrastim, sargramostim -some antibiotics like amikacin, gentamicin, neomycin, streptomycin, tobramycin -vaccines Talk to your doctor or health care professional before taking any of these medicines: -acetaminophen -aspirin -ibuprofen -ketoprofen -naproxen This list may not describe all possible interactions. Give your health care provider a list of all the medicines, herbs, non-prescription drugs, or dietary supplements you use. Also tell them if you smoke, drink alcohol, or use illegal drugs. Some items may interact with your medicine. What should I watch for while using this medicine? Your condition will be monitored carefully while you are receiving this medicine. You will need important blood work done while you are taking this medicine. This drug may make you feel generally unwell. This is not uncommon, as chemotherapy can affect healthy cells as well as cancer cells. Report any side effects. Continue your course of treatment even though you feel ill unless your doctor tells you to stop. In some cases, you may be given additional medicines to help with side  effects. Follow all directions for their use. Call your doctor or health care professional for advice if you get a fever, chills or sore throat, or  other symptoms of a cold or flu. Do not treat yourself. This drug decreases your body's ability to fight infections. Try to avoid being around people who are sick. This medicine may increase your risk to bruise or bleed. Call your doctor or health care professional if you notice any unusual bleeding. Be careful brushing and flossing your teeth or using a toothpick because you may get an infection or bleed more easily. If you have any dental work done, tell your dentist you are receiving this medicine. Avoid taking products that contain aspirin, acetaminophen, ibuprofen, naproxen, or ketoprofen unless instructed by your doctor. These medicines may hide a fever. Do not become pregnant while taking this medicine. Women should inform their doctor if they wish to become pregnant or think they might be pregnant. There is a potential for serious side effects to an unborn child. Talk to your health care professional or pharmacist for more information. Do not breast-feed an infant while taking this medicine. What side effects may I notice from receiving this medicine? Side effects that you should report to your doctor or health care professional as soon as possible: -allergic reactions like skin rash, itching or hives, swelling of the face, lips, or tongue -signs of infection - fever or chills, cough, sore throat, pain or difficulty passing urine -signs of decreased platelets or bleeding - bruising, pinpoint red spots on the skin, black, tarry stools, nosebleeds -signs of decreased red blood cells - unusually weak or tired, fainting spells, lightheadedness -breathing problems -changes in hearing -changes in vision -chest pain -high blood pressure -low blood counts - This drug may decrease the number of white blood cells, red blood cells and platelets. You may be at increased risk for infections and bleeding. -nausea and vomiting -pain, swelling, redness or irritation at the injection site -pain, tingling,  numbness in the hands or feet -problems with balance, talking, walking -trouble passing urine or change in the amount of urine Side effects that usually do not require medical attention (report to your doctor or health care professional if they continue or are bothersome): -hair loss -loss of appetite -metallic taste in the mouth or changes in taste This list may not describe all possible side effects. Call your doctor for medical advice about side effects. You may report side effects to FDA at 1-800-FDA-1088. Where should I keep my medicine? This drug is given in a hospital or clinic and will not be stored at home. NOTE: This sheet is a summary. It may not cover all possible information. If you have questions about this medicine, talk to your doctor, pharmacist, or health care provider.  2018 Elsevier/Gold Standard (2007-05-05 14:38:05)   Gemcitabine injection GEMZAR What is this medicine? GEMCITABINE (jem SIT a been) is a chemotherapy drug. This medicine is used to treat many types of cancer like breast cancer, lung cancer, pancreatic cancer, and ovarian cancer. This medicine may be used for other purposes; ask your health care provider or pharmacist if you have questions. COMMON BRAND NAME(S): Gemzar What should I tell my health care provider before I take this medicine? They need to know if you have any of these conditions: -blood disorders -infection -kidney disease -liver disease -recent or ongoing radiation therapy -an unusual or allergic reaction to gemcitabine, other chemotherapy, other medicines, foods, dyes, or preservatives -pregnant or trying to get pregnant -  breast-feeding How should I use this medicine? This drug is given as an infusion into a vein. It is administered in a hospital or clinic by a specially trained health care professional. Talk to your pediatrician regarding the use of this medicine in children. Special care may be needed. Overdosage: If you think you  have taken too much of this medicine contact a poison control center or emergency room at once. NOTE: This medicine is only for you. Do not share this medicine with others. What if I miss a dose? It is important not to miss your dose. Call your doctor or health care professional if you are unable to keep an appointment. What may interact with this medicine? -medicines to increase blood counts like filgrastim, pegfilgrastim, sargramostim -some other chemotherapy drugs like cisplatin -vaccines Talk to your doctor or health care professional before taking any of these medicines: -acetaminophen -aspirin -ibuprofen -ketoprofen -naproxen This list may not describe all possible interactions. Give your health care provider a list of all the medicines, herbs, non-prescription drugs, or dietary supplements you use. Also tell them if you smoke, drink alcohol, or use illegal drugs. Some items may interact with your medicine. What should I watch for while using this medicine? Visit your doctor for checks on your progress. This drug may make you feel generally unwell. This is not uncommon, as chemotherapy can affect healthy cells as well as cancer cells. Report any side effects. Continue your course of treatment even though you feel ill unless your doctor tells you to stop. In some cases, you may be given additional medicines to help with side effects. Follow all directions for their use. Call your doctor or health care professional for advice if you get a fever, chills or sore throat, or other symptoms of a cold or flu. Do not treat yourself. This drug decreases your body's ability to fight infections. Try to avoid being around people who are sick. This medicine may increase your risk to bruise or bleed. Call your doctor or health care professional if you notice any unusual bleeding. Be careful brushing and flossing your teeth or using a toothpick because you may get an infection or bleed more easily. If you  have any dental work done, tell your dentist you are receiving this medicine. Avoid taking products that contain aspirin, acetaminophen, ibuprofen, naproxen, or ketoprofen unless instructed by your doctor. These medicines may hide a fever. Women should inform their doctor if they wish to become pregnant or think they might be pregnant. There is a potential for serious side effects to an unborn child. Talk to your health care professional or pharmacist for more information. Do not breast-feed an infant while taking this medicine. What side effects may I notice from receiving this medicine? Side effects that you should report to your doctor or health care professional as soon as possible: -allergic reactions like skin rash, itching or hives, swelling of the face, lips, or tongue -low blood counts - this medicine may decrease the number of white blood cells, red blood cells and platelets. You may be at increased risk for infections and bleeding. -signs of infection - fever or chills, cough, sore throat, pain or difficulty passing urine -signs of decreased platelets or bleeding - bruising, pinpoint red spots on the skin, black, tarry stools, blood in the urine -signs of decreased red blood cells - unusually weak or tired, fainting spells, lightheadedness -breathing problems -chest pain -mouth sores -nausea and vomiting -pain, swelling, redness at site where injected -pain,  tingling, numbness in the hands or feet -stomach pain -swelling of ankles, feet, hands -unusual bleeding Side effects that usually do not require medical attention (report to your doctor or health care professional if they continue or are bothersome): -constipation -diarrhea -hair loss -loss of appetite -stomach upset This list may not describe all possible side effects. Call your doctor for medical advice about side effects. You may report side effects to FDA at 1-800-FDA-1088. Where should I keep my medicine? This drug is  given in a hospital or clinic and will not be stored at home. NOTE: This sheet is a summary. It may not cover all possible information. If you have questions about this medicine, talk to your doctor, pharmacist, or health care provider.  2018 Elsevier/Gold Standard (2007-06-09 18:45:54)

## 2016-10-24 ENCOUNTER — Ambulatory Visit (HOSPITAL_BASED_OUTPATIENT_CLINIC_OR_DEPARTMENT_OTHER): Payer: Medicare Other

## 2016-10-24 VITALS — BP 143/87 | HR 92 | Temp 98.5°F | Resp 18

## 2016-10-24 DIAGNOSIS — Z5189 Encounter for other specified aftercare: Secondary | ICD-10-CM

## 2016-10-24 DIAGNOSIS — C50412 Malignant neoplasm of upper-outer quadrant of left female breast: Secondary | ICD-10-CM

## 2016-10-24 DIAGNOSIS — Z170 Estrogen receptor positive status [ER+]: Principal | ICD-10-CM

## 2016-10-24 MED ORDER — TBO-FILGRASTIM 300 MCG/0.5ML ~~LOC~~ SOSY
300.0000 ug | PREFILLED_SYRINGE | Freq: Once | SUBCUTANEOUS | Status: AC
  Administered 2016-10-24: 300 ug via SUBCUTANEOUS
  Filled 2016-10-24: qty 0.5

## 2016-10-25 ENCOUNTER — Ambulatory Visit (HOSPITAL_BASED_OUTPATIENT_CLINIC_OR_DEPARTMENT_OTHER): Payer: Medicare Other

## 2016-10-25 VITALS — BP 130/78 | HR 90 | Temp 98.5°F | Resp 20

## 2016-10-25 DIAGNOSIS — Z5189 Encounter for other specified aftercare: Secondary | ICD-10-CM | POA: Diagnosis not present

## 2016-10-25 DIAGNOSIS — C50412 Malignant neoplasm of upper-outer quadrant of left female breast: Secondary | ICD-10-CM | POA: Diagnosis present

## 2016-10-25 DIAGNOSIS — Z170 Estrogen receptor positive status [ER+]: Principal | ICD-10-CM

## 2016-10-25 MED ORDER — TBO-FILGRASTIM 300 MCG/0.5ML ~~LOC~~ SOSY
300.0000 ug | PREFILLED_SYRINGE | Freq: Once | SUBCUTANEOUS | Status: AC
  Administered 2016-10-25: 300 ug via SUBCUTANEOUS
  Filled 2016-10-25: qty 0.5

## 2016-10-25 NOTE — Patient Instructions (Signed)
Tbo-Filgrastim injection What is this medicine? TBO-FILGRASTIM (T B O fil GRA stim) is a granulocyte colony-stimulating factor that stimulates the growth of neutrophils, a type of white blood cell important in the body's fight against infection. It is used to reduce the incidence of fever and infection in patients with certain types of cancer who are receiving chemotherapy that affects the bone marrow. This medicine may be used for other purposes; ask your health care provider or pharmacist if you have questions. COMMON BRAND NAME(S): Granix What should I tell my health care provider before I take this medicine? They need to know if you have any of these conditions: -bone scan or tests planned -kidney disease -sickle cell anemia -an unusual or allergic reaction to tbo-filgrastim, filgrastim, pegfilgrastim, other medicines, foods, dyes, or preservatives -pregnant or trying to get pregnant -breast-feeding How should I use this medicine? This medicine is for injection under the skin. If you get this medicine at home, you will be taught how to prepare and give this medicine. Refer to the Instructions for Use that come with your medication packaging. Use exactly as directed. Take your medicine at regular intervals. Do not take your medicine more often than directed. It is important that you put your used needles and syringes in a special sharps container. Do not put them in a trash can. If you do not have a sharps container, call your pharmacist or healthcare provider to get one. Talk to your pediatrician regarding the use of this medicine in children. Special care may be needed. Overdosage: If you think you have taken too much of this medicine contact a poison control center or emergency room at once. NOTE: This medicine is only for you. Do not share this medicine with others. What if I miss a dose? It is important not to miss your dose. Call your doctor or health care professional if you miss a  dose. What may interact with this medicine? This medicine may interact with the following medications: -medicines that may cause a release of neutrophils, such as lithium This list may not describe all possible interactions. Give your health care provider a list of all the medicines, herbs, non-prescription drugs, or dietary supplements you use. Also tell them if you smoke, drink alcohol, or use illegal drugs. Some items may interact with your medicine. What should I watch for while using this medicine? You may need blood work done while you are taking this medicine. What side effects may I notice from receiving this medicine? Side effects that you should report to your doctor or health care professional as soon as possible: -allergic reactions like skin rash, itching or hives, swelling of the face, lips, or tongue -blood in the urine -dark urine -dizziness -fast heartbeat -feeling faint -shortness of breath or breathing problems -signs and symptoms of infection like fever or chills; cough; or sore throat -signs and symptoms of kidney injury like trouble passing urine or change in the amount of urine -stomach or side pain, or pain at the shoulder -sweating -swelling of the legs, ankles, or abdomen -tiredness Side effects that usually do not require medical attention (report to your doctor or health care professional if they continue or are bothersome): -bone pain -headache -muscle pain -vomiting This list may not describe all possible side effects. Call your doctor for medical advice about side effects. You may report side effects to FDA at 1-800-FDA-1088. Where should I keep my medicine? Keep out of the reach of children. Store in a refrigerator between   2 and 8 degrees C (36 and 46 degrees F). Keep in carton to protect from light. Throw away this medicine if it is left out of the refrigerator for more than 5 consecutive days. Throw away any unused medicine after the expiration  date. NOTE: This sheet is a summary. It may not cover all possible information. If you have questions about this medicine, talk to your doctor, pharmacist, or health care provider.  2018 [employer]/Gold Standard (2015-03-20 19:07:04)  

## 2016-10-30 ENCOUNTER — Telehealth: Payer: Self-pay

## 2016-10-30 ENCOUNTER — Other Ambulatory Visit (HOSPITAL_BASED_OUTPATIENT_CLINIC_OR_DEPARTMENT_OTHER): Payer: Medicare Other

## 2016-10-30 ENCOUNTER — Encounter: Payer: Self-pay | Admitting: Adult Health

## 2016-10-30 ENCOUNTER — Ambulatory Visit (HOSPITAL_BASED_OUTPATIENT_CLINIC_OR_DEPARTMENT_OTHER): Payer: Medicare Other

## 2016-10-30 ENCOUNTER — Ambulatory Visit (HOSPITAL_BASED_OUTPATIENT_CLINIC_OR_DEPARTMENT_OTHER): Payer: Medicare Other | Admitting: Adult Health

## 2016-10-30 VITALS — BP 134/96 | HR 88 | Temp 97.8°F | Resp 18 | Ht 64.0 in | Wt 145.2 lb

## 2016-10-30 DIAGNOSIS — R5383 Other fatigue: Secondary | ICD-10-CM

## 2016-10-30 DIAGNOSIS — C50412 Malignant neoplasm of upper-outer quadrant of left female breast: Secondary | ICD-10-CM | POA: Diagnosis not present

## 2016-10-30 DIAGNOSIS — Z170 Estrogen receptor positive status [ER+]: Principal | ICD-10-CM

## 2016-10-30 DIAGNOSIS — Z95828 Presence of other vascular implants and grafts: Secondary | ICD-10-CM

## 2016-10-30 DIAGNOSIS — Z5111 Encounter for antineoplastic chemotherapy: Secondary | ICD-10-CM

## 2016-10-30 DIAGNOSIS — C50012 Malignant neoplasm of nipple and areola, left female breast: Secondary | ICD-10-CM

## 2016-10-30 LAB — COMPREHENSIVE METABOLIC PANEL
ALBUMIN: 3.7 g/dL (ref 3.5–5.0)
ALK PHOS: 110 U/L (ref 40–150)
ALT: 22 U/L (ref 0–55)
AST: 28 U/L (ref 5–34)
Anion Gap: 9 mEq/L (ref 3–11)
BUN: 22.9 mg/dL (ref 7.0–26.0)
CALCIUM: 9.4 mg/dL (ref 8.4–10.4)
CHLORIDE: 105 meq/L (ref 98–109)
CO2: 26 mEq/L (ref 22–29)
CREATININE: 1.2 mg/dL — AB (ref 0.6–1.1)
EGFR: 46 mL/min/{1.73_m2} — ABNORMAL LOW (ref >90)
Glucose: 90 mg/dl (ref 70–140)
Potassium: 3.8 mEq/L (ref 3.5–5.1)
Sodium: 139 mEq/L (ref 136–145)
TOTAL PROTEIN: 7 g/dL (ref 6.4–8.3)
Total Bilirubin: 0.68 mg/dL (ref 0.20–1.20)

## 2016-10-30 LAB — CBC WITH DIFFERENTIAL/PLATELET
BASO%: 0.4 % (ref 0.0–2.0)
Basophils Absolute: 0 10*3/uL (ref 0.0–0.1)
EOS%: 0.6 % (ref 0.0–7.0)
Eosinophils Absolute: 0 10*3/uL (ref 0.0–0.5)
HEMATOCRIT: 34.7 % — AB (ref 34.8–46.6)
HEMOGLOBIN: 11.7 g/dL (ref 11.6–15.9)
LYMPH#: 1.9 10*3/uL (ref 0.9–3.3)
LYMPH%: 38.2 % (ref 14.0–49.7)
MCH: 31.2 pg (ref 25.1–34.0)
MCHC: 33.7 g/dL (ref 31.5–36.0)
MCV: 92.5 fL (ref 79.5–101.0)
MONO#: 0.6 10*3/uL (ref 0.1–0.9)
MONO%: 12 % (ref 0.0–14.0)
NEUT%: 48.8 % (ref 38.4–76.8)
NEUTROS ABS: 2.4 10*3/uL (ref 1.5–6.5)
Platelets: 130 10*3/uL — ABNORMAL LOW (ref 145–400)
RBC: 3.75 10*6/uL (ref 3.70–5.45)
RDW: 13.9 % (ref 11.2–14.5)
WBC: 5 10*3/uL (ref 3.9–10.3)

## 2016-10-30 MED ORDER — PALONOSETRON HCL INJECTION 0.25 MG/5ML
INTRAVENOUS | Status: AC
  Filled 2016-10-30: qty 5

## 2016-10-30 MED ORDER — SODIUM CHLORIDE 0.9% FLUSH
10.0000 mL | Freq: Once | INTRAVENOUS | Status: AC
  Administered 2016-10-30: 10 mL
  Filled 2016-10-30: qty 10

## 2016-10-30 MED ORDER — SODIUM CHLORIDE 0.9% FLUSH
10.0000 mL | INTRAVENOUS | Status: DC | PRN
  Filled 2016-10-30: qty 10

## 2016-10-30 MED ORDER — HEPARIN SOD (PORK) LOCK FLUSH 100 UNIT/ML IV SOLN
500.0000 [IU] | Freq: Once | INTRAVENOUS | Status: AC | PRN
  Administered 2016-10-30: 500 [IU]
  Filled 2016-10-30: qty 5

## 2016-10-30 MED ORDER — HEPARIN SOD (PORK) LOCK FLUSH 100 UNIT/ML IV SOLN
500.0000 [IU] | Freq: Once | INTRAVENOUS | Status: AC
  Administered 2016-10-30: 500 [IU]
  Filled 2016-10-30: qty 5

## 2016-10-30 MED ORDER — SODIUM CHLORIDE 0.9 % IV SOLN
Freq: Once | INTRAVENOUS | Status: AC
  Administered 2016-10-30: 13:00:00 via INTRAVENOUS

## 2016-10-30 MED ORDER — PALONOSETRON HCL INJECTION 0.25 MG/5ML
0.2500 mg | Freq: Once | INTRAVENOUS | Status: AC
  Administered 2016-10-30: 0.25 mg via INTRAVENOUS

## 2016-10-30 MED ORDER — GEMCITABINE HCL CHEMO INJECTION 1 GM/26.3ML
800.0000 mg/m2 | Freq: Once | INTRAVENOUS | Status: AC
  Administered 2016-10-30: 1368 mg via INTRAVENOUS
  Filled 2016-10-30: qty 35.98

## 2016-10-30 MED ORDER — SODIUM CHLORIDE 0.9 % IV SOLN
130.4000 mg | Freq: Once | INTRAVENOUS | Status: AC
  Administered 2016-10-30: 130 mg via INTRAVENOUS
  Filled 2016-10-30: qty 13

## 2016-10-30 NOTE — Telephone Encounter (Signed)
Patient in treatment area will receive avs and calender afterwards. Per 9/19 message

## 2016-10-30 NOTE — Progress Notes (Signed)
Patient does not want ONPRO.  Will return tomorrow for neulasta.

## 2016-10-30 NOTE — Patient Instructions (Signed)
[facility] Discharge Instructions for Patients Receiving Chemotherapy  Today you received the following chemotherapy agents Carboplatin/ Gemzar  To help prevent nausea and vomiting after your treatment, we encourage you to take your nausea medication as directed by your MD.   If you develop nausea and vomiting that is not controlled by your nausea medication, call the clinic.   BELOW ARE SYMPTOMS THAT SHOULD BE REPORTED IMMEDIATELY:  *FEVER GREATER THAN 100.5 F  *CHILLS WITH OR WITHOUT FEVER  NAUSEA AND VOMITING THAT IS NOT CONTROLLED WITH YOUR NAUSEA MEDICATION  *UNUSUAL SHORTNESS OF BREATH  *UNUSUAL BRUISING OR BLEEDING  TENDERNESS IN MOUTH AND THROAT WITH OR WITHOUT PRESENCE OF ULCERS  *URINARY PROBLEMS  *BOWEL PROBLEMS  UNUSUAL RASH Items with * indicate a potential emergency and should be followed up as soon as possible.  Feel free to call the clinic you have any questions or concerns. The clinic phone number is [phone].  Please show the [facility] at check-in to the Emergency Department and triage nurse.  Carboplatin injection What is this medicine? CARBOPLATIN (KAR boe pla tin) is a chemotherapy drug. It targets fast dividing cells, like cancer cells, and causes these cells to die. This medicine is used to treat ovarian cancer and many other cancers. This medicine may be used for other purposes; ask your health care provider or pharmacist if you have questions. COMMON BRAND NAME(S): Paraplatin What should I tell my health care provider before I take this medicine? They need to know if you have any of these conditions: -blood disorders -hearing problems -kidney disease -recent or ongoing radiation therapy -an unusual or allergic reaction to carboplatin, cisplatin, other chemotherapy, other medicines, foods, dyes, or preservatives -pregnant or trying to get pregnant -breast-feeding How should I use this medicine? This drug is usually  given as an infusion into a vein. It is administered in a hospital or clinic by a specially trained health care professional. Talk to your pediatrician regarding the use of this medicine in children. Special care may be needed. Overdosage: If you think you have taken too much of this medicine contact a poison control center or emergency room at once. NOTE: This medicine is only for you. Do not share this medicine with others. What if I miss a dose? It is important not to miss a dose. Call your doctor or health care professional if you are unable to keep an appointment. What may interact with this medicine? -medicines for seizures -medicines to increase blood counts like filgrastim, pegfilgrastim, sargramostim -some antibiotics like amikacin, gentamicin, neomycin, streptomycin, tobramycin -vaccines Talk to your doctor or health care professional before taking any of these medicines: -acetaminophen -aspirin -ibuprofen -ketoprofen -naproxen This list may not describe all possible interactions. Give your health care provider a list of all the medicines, herbs, non-prescription drugs, or dietary supplements you use. Also tell them if you smoke, drink alcohol, or use illegal drugs. Some items may interact with your medicine. What should I watch for while using this medicine? Your condition will be monitored carefully while you are receiving this medicine. You will need important blood work done while you are taking this medicine. This drug may make you feel generally unwell. This is not uncommon, as chemotherapy can affect healthy cells as well as cancer cells. Report any side effects. Continue your course of treatment even though you feel ill unless your doctor tells you to stop. In some cases, you may be given additional medicines to help with side  effects. Follow all directions for their use. Call your doctor or health care professional for advice if you get a fever, chills or sore throat, or  other symptoms of a cold or flu. Do not treat yourself. This drug decreases your body's ability to fight infections. Try to avoid being around people who are sick. This medicine may increase your risk to bruise or bleed. Call your doctor or health care professional if you notice any unusual bleeding. Be careful brushing and flossing your teeth or using a toothpick because you may get an infection or bleed more easily. If you have any dental work done, tell your dentist you are receiving this medicine. Avoid taking products that contain aspirin, acetaminophen, ibuprofen, naproxen, or ketoprofen unless instructed by your doctor. These medicines may hide a fever. Do not become pregnant while taking this medicine. Women should inform their doctor if they wish to become pregnant or think they might be pregnant. There is a potential for serious side effects to an unborn child. Talk to your health care professional or pharmacist for more information. Do not breast-feed an infant while taking this medicine. What side effects may I notice from receiving this medicine? Side effects that you should report to your doctor or health care professional as soon as possible: -allergic reactions like skin rash, itching or hives, swelling of the face, lips, or tongue -signs of infection - fever or chills, cough, sore throat, pain or difficulty passing urine -signs of decreased platelets or bleeding - bruising, pinpoint red spots on the skin, black, tarry stools, nosebleeds -signs of decreased red blood cells - unusually weak or tired, fainting spells, lightheadedness -breathing problems -changes in hearing -changes in vision -chest pain -high blood pressure -low blood counts - This drug may decrease the number of white blood cells, red blood cells and platelets. You may be at increased risk for infections and bleeding. -nausea and vomiting -pain, swelling, redness or irritation at the injection site -pain, tingling,  numbness in the hands or feet -problems with balance, talking, walking -trouble passing urine or change in the amount of urine Side effects that usually do not require medical attention (report to your doctor or health care professional if they continue or are bothersome): -hair loss -loss of appetite -metallic taste in the mouth or changes in taste This list may not describe all possible side effects. Call your doctor for medical advice about side effects. You may report side effects to FDA at 1-800-FDA-1088. Where should I keep my medicine? This drug is given in a hospital or clinic and will not be stored at home. NOTE: This sheet is a summary. It may not cover all possible information. If you have questions about this medicine, talk to your doctor, pharmacist, or health care provider.  2018 Elsevier/Gold Standard (2007-05-05 14:38:05)   Gemcitabine injection GEMZAR What is this medicine? GEMCITABINE (jem SIT a been) is a chemotherapy drug. This medicine is used to treat many types of cancer like breast cancer, lung cancer, pancreatic cancer, and ovarian cancer. This medicine may be used for other purposes; ask your health care provider or pharmacist if you have questions. COMMON BRAND NAME(S): Gemzar What should I tell my health care provider before I take this medicine? They need to know if you have any of these conditions: -blood disorders -infection -kidney disease -liver disease -recent or ongoing radiation therapy -an unusual or allergic reaction to gemcitabine, other chemotherapy, other medicines, foods, dyes, or preservatives -pregnant or trying to get pregnant -  breast-feeding How should I use this medicine? This drug is given as an infusion into a vein. It is administered in a hospital or clinic by a specially trained health care professional. Talk to your pediatrician regarding the use of this medicine in children. Special care may be needed. Overdosage: If you think you  have taken too much of this medicine contact a poison control center or emergency room at once. NOTE: This medicine is only for you. Do not share this medicine with others. What if I miss a dose? It is important not to miss your dose. Call your doctor or health care professional if you are unable to keep an appointment. What may interact with this medicine? -medicines to increase blood counts like filgrastim, pegfilgrastim, sargramostim -some other chemotherapy drugs like cisplatin -vaccines Talk to your doctor or health care professional before taking any of these medicines: -acetaminophen -aspirin -ibuprofen -ketoprofen -naproxen This list may not describe all possible interactions. Give your health care provider a list of all the medicines, herbs, non-prescription drugs, or dietary supplements you use. Also tell them if you smoke, drink alcohol, or use illegal drugs. Some items may interact with your medicine. What should I watch for while using this medicine? Visit your doctor for checks on your progress. This drug may make you feel generally unwell. This is not uncommon, as chemotherapy can affect healthy cells as well as cancer cells. Report any side effects. Continue your course of treatment even though you feel ill unless your doctor tells you to stop. In some cases, you may be given additional medicines to help with side effects. Follow all directions for their use. Call your doctor or health care professional for advice if you get a fever, chills or sore throat, or other symptoms of a cold or flu. Do not treat yourself. This drug decreases your body's ability to fight infections. Try to avoid being around people who are sick. This medicine may increase your risk to bruise or bleed. Call your doctor or health care professional if you notice any unusual bleeding. Be careful brushing and flossing your teeth or using a toothpick because you may get an infection or bleed more easily. If you  have any dental work done, tell your dentist you are receiving this medicine. Avoid taking products that contain aspirin, acetaminophen, ibuprofen, naproxen, or ketoprofen unless instructed by your doctor. These medicines may hide a fever. Women should inform their doctor if they wish to become pregnant or think they might be pregnant. There is a potential for serious side effects to an unborn child. Talk to your health care professional or pharmacist for more information. Do not breast-feed an infant while taking this medicine. What side effects may I notice from receiving this medicine? Side effects that you should report to your doctor or health care professional as soon as possible: -allergic reactions like skin rash, itching or hives, swelling of the face, lips, or tongue -low blood counts - this medicine may decrease the number of white blood cells, red blood cells and platelets. You may be at increased risk for infections and bleeding. -signs of infection - fever or chills, cough, sore throat, pain or difficulty passing urine -signs of decreased platelets or bleeding - bruising, pinpoint red spots on the skin, black, tarry stools, blood in the urine -signs of decreased red blood cells - unusually weak or tired, fainting spells, lightheadedness -breathing problems -chest pain -mouth sores -nausea and vomiting -pain, swelling, redness at site where injected -pain,  tingling, numbness in the hands or feet -stomach pain -swelling of ankles, feet, hands -unusual bleeding Side effects that usually do not require medical attention (report to your doctor or health care professional if they continue or are bothersome): -constipation -diarrhea -hair loss -loss of appetite -stomach upset This list may not describe all possible side effects. Call your doctor for medical advice about side effects. You may report side effects to FDA at 1-800-FDA-1088. Where should I keep my medicine? This drug is  given in a hospital or clinic and will not be stored at home. NOTE: This sheet is a summary. It may not cover all possible information. If you have questions about this medicine, talk to your doctor, pharmacist, or health care provider.  2018 Elsevier/Gold Standard (2007-06-09 18:45:54)

## 2016-10-30 NOTE — Progress Notes (Signed)
. [name]   DOB: 1941/01/20  MR#: [id number]  CSN#:[id number]  PCP: [name], MD Patient Care Team: [name], MD as PCP - General Magrinat, [name], MD as Consulting Physician (Oncology) [name], MD as Consulting Physician (General Surgery) [name], MD as Consulting Physician (Gastroenterology)   CHIEF COMPLAINT: Weakly estrogen receptor positive breast cancer  CURRENT TREATMENT: Adjuvant chemotherapy   INTERVAL HISTORY: Nikeisha returns today for follow-up and treatment of her weakly estrogen receptor positive left sided breast cancer. Today is day 8 cycle 2 of 4 planned cycles of carboplatin and gemcitabine which she receives on days 1 and 8 of each 21 day cycle with Neupogen on days 2 and 3, and Neulasta in the form of Onpro on day 8 of every cycle.    Leiann is doing well today.  Her only question is if we can discontinue the Dexamethasone premedication completely from her chemotherapy regimen.  She is doing well.    REVIEW OF SYSTEMS: Karthika denies any issues today other than some fatigue.  A detailed ROS was conducted and was non contributory.    BREAST CANCER HISTORY: From the recent summary note:  I last saw Lacie in 2013 when she was released from follow-up from her right breast cancer recurrence in 2004. She is status post right mastectomy and adjuvant chemotherapy for that triple negative tumor. More recently, on 07/16/2016 she underwent left screening mammography at the [facility] on 07/16/2016. This found the breast density to be category B. There was a possible asymmetry in the left breast, and on 07/19/2016 she underwent left diagnostic mammography with tomography and left breast ultrasonography. This confirmed a well circumscribed oval mass at the 9:00 position of the left breast measuring approximately 0.8 cm. This was not palpable. On ultrasonography the mass measured 0.6 cm and it was located at the 9:00 radiant 4 cm from  the nipple. The left axilla was sonographically benign.  On June 11 Ryanne underwent biopsy of the left breast mass in question and this showed ([facility] [phone]) and invasive ductal carcinoma, grade 3, estrogen receptor 5% positive with weak staining intensity, progesterone receptor negative, with an MIB-1 of 80%, and HER-2 nonamplified, the signals ratio being 1.46 and the number per cell 1.90.  Her subsequent history is as detailed below.   PAST MEDICAL HISTORY: Past Medical History:  Diagnosis Date  . Allergic rhinitis   . Anxiety   . Breast cancer ([facility]) hx 2004   recurrent 2006 DR Magrinat  . HTN (hypertension)   . Hyperlipidemia   . Hypothyroidism   . Personal history of chemotherapy 2002  . Personal history of radiation therapy 2002  . Psoriasis   . S/P thyroidectomy 07/2005   2 cm largest diameter (1 other tiny focus)/ i-131 rx 99 [facility] 08/2005  . Thyroid cancer ([facility])    Papillary Stage 1 - Dr Loanne Drilling  . Vitamin B12 deficiency 2009  . Vitamin D deficiency 2009    PAST SURGICAL HISTORY: Past Surgical History:  Procedure Laterality Date  . BREAST LUMPECTOMY    . BREAST LUMPECTOMY WITH RADIOACTIVE SEED AND SENTINEL LYMPH NODE BIOPSY Left 09/11/2016   Procedure: LEFT BREAST LUMPECTOMY WITH RADIOACTIVE SEED AND LEFT AXILLARY SENTINEL LYMPH NODE BIOPSY WITH BLUE DYE INJECTION;  Surgeon: [name], MD;  Location: [facility];  Service: General;  Laterality: Left;  . IR FLUORO GUIDE PORT INSERTION RIGHT  09/13/2016  . IR US GUIDE VASC ACCESS RIGHT  09/13/2016  .  MASTECTOMY     Right  . PORTACATH PLACEMENT N/A 09/11/2016   Procedure: ATTEMPTED INSERTION PORT-A-CATH WITH ULTRA SOUND GUIDANCE;  Surgeon: [name], MD;  Location: [facility];  Service: General;  Laterality: N/A;  . REDUCTION MAMMAPLASTY Left 2005  . THYROIDECTOMY  2007    FAMILY HISTORY Family History  Problem Relation Age of Onset  . Stroke Mother   . Allergies Mother   . Asthma  Mother   . Clotting disorder Mother   . Heart disease Father 72       MI  . Allergies Sister   . Asthma Sister   . Cancer Sister        Breast  . Asthma Brother   . Allergies Daughter   . Asthma Daughter   . Allergies Sister   Family history includes a sister, a paternal aunt, and a paternal cousin with breast cancer all older then 59 at diagnosis  GYN HISTORY: Menarche age 27, first live birth age 63. She is GX P3. She went through the change of life age 45. She did not take hormone replacement.  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 Her husband [name] is retired from [employer]. Her children are [name], who works for time [employer] in [place], and River Park, who lives in [place] and works for [employer]. The third child, [name], died in an automobile accident at age 53. The patient has one grandchild. 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 Smoking status: Never Smoker  . Smokeless tobacco: Never Used  . Alcohol use No     Colonoscopy:  PAP:  Bone density:  Lipid panel:  Allergies  Allergen Reactions  . Pneumococcal Vaccines Other (See Comments)    Was really sick   . Sulfa Antibiotics Other (See Comments)    Pt believes it was hives  . Sulfacetamide Sodium-Sulfur   . Tape Itching, Dermatitis and Rash  . Tegaderm Ag Mesh [Silver] Itching and Rash    Current Outpatient Prescriptions  Medication Sig Dispense Refill  . ALPRAZolam (XANAX) 0.25 MG tablet Take 0.125-0.25 mg by mouth 2 (two) times daily as needed for anxiety or sleep (Pt takes a half of a tablet every evening).     [name] amoxicillin (AMOXIL) 500 MG tablet Take 500 mg by mouth 1 day or 1 dose.    . cholecalciferol (VITAMIN D) 1000 units tablet Take 1,000 Units by mouth daily.    [name] dexamethasone (DECADRON) 4 MG tablet Take 1 tablet twice a day with food AS NEEDED for nausea 30  tablet 1  . DULoxetine (CYMBALTA) 60 MG capsule Take 60 mg by mouth every morning.     [name] levothyroxine (SYNTHROID, LEVOTHROID) 112 MCG tablet TAKE 1 TABLET (112 MCG TOTAL) BY MOUTH DAILY. 90 tablet 3  . lidocaine-prilocaine (EMLA) cream Apply to affected area once 30 g 3  . losartan (COZAAR) 100 MG tablet TAKE 1 TABLET BY MOUTH EVERY DAY FOR BLOOD PRESSURE 90 tablet 1  . prochlorperazine (COMPAZINE) 10 MG tablet Take before meals and at bedtime day after chemo, then as needed for nausea 30 tablet 1  . rosuvastatin (CRESTOR) 20 MG tablet Take 1 tablet (20 mg total) by mouth daily. 90 tablet 2  . Syringe/Needle, Disp, (B-D ECLIPSE SYRINGE) 30G X 1/2" 1 ML MISC 1 each by Does not apply route 1 day or 1  dose. For Vit B12 inj 50 each 11  . vitamin B-12 (CYANOCOBALAMIN) 1000 MCG tablet Take 1,000 mcg by mouth every other day.     No current facility-administered medications for this visit.    Facility-Administered Medications Ordered in Other Visits  Medication Dose Route Frequency Provider Last Rate Last Dose  . sodium chloride flush (NS) 0.9 % injection 10 mL  10 mL Intracatheter PRN Magrinat, [name], MD        OBJECTIVE:   Vitals:   10/30/16 1108  BP: (!) 134/96  Pulse: 88  Resp: 18  Temp: 97.8 F (36.6 C)  SpO2: 100%     Body mass index is 24.92 kg/m.    ECOG FS: 1  GENERAL: Patient is a well appearing female in no acute distress HEENT:  Sclerae anicteric. PERRL  Oropharynx clear and moist. No ulcerations or evidence of oropharyngeal candidiasis. Neck is supple. There is no swelling noted along her left lower mandible or tenderness noted to palpation NODES:  No cervical, supraclavicular, or axillary lymphadenopathy palpated.  BREAST EXAM:  Deferred. LUNGS:  Clear to auscultation bilaterally.  No wheezes or rhonchi. HEART:  Regular rate and rhythm. No murmur appreciated. ABDOMEN:  Soft, nontender.  Positive, normoactive bowel sounds. No organomegaly palpated. MSK:  No focal spinal  tenderness to palpation. Full range of motion bilaterally in the upper extremities. EXTREMITIES:  No peripheral edema.   SKIN:  Clear with no obvious rashes or skin changes. No nail dyscrasia. NEURO:  Nonfocal. Well oriented.  Appropriate affect.      LAB RESULTS: Lab Results  Component Value Date   WBC 5.0 10/30/2016   NEUTROABS 2.4 10/30/2016   HGB 11.7 10/30/2016   HCT 34.7 (L) 10/30/2016   MCV 92.5 10/30/2016   PLT 130 (L) 10/30/2016      Chemistry      Component Value Date/Time   NA 139 10/30/2016 1037   K 3.8 10/30/2016 1037   CL 108 09/13/2016 0728   CL 104 10/09/2011 1403   CO2 26 10/30/2016 1037   BUN 22.9 10/30/2016 1037   CREATININE 1.2 (H) 10/30/2016 1037      Component Value Date/Time   CALCIUM 9.4 10/30/2016 1037   ALKPHOS 110 10/30/2016 1037   AST 28 10/30/2016 1037   ALT 22 10/30/2016 1037   BILITOT 0.68 10/30/2016 1037       Lab Results  Component Value Date   LABCA2 16 10/09/2011    No components found for: [id number]  No results for input(s): INR in the last 168 hours.  Urinalysis    Component Value Date/Time   COLORURINE YELLOW 06/05/2015 1559   APPEARANCEUR CLEAR 06/05/2015 1559   LABSPEC 1.020 06/05/2015 1559   PHURINE 5.5 06/05/2015 1559   GLUCOSEU NEGATIVE 06/05/2015 1559   HGBUR NEGATIVE 06/05/2015 1559   BILIRUBINUR NEGATIVE 06/05/2015 1559   KETONESUR NEGATIVE 06/05/2015 1559   UROBILINOGEN 0.2 06/05/2015 1559   NITRITE NEGATIVE 06/05/2015 1559   LEUKOCYTESUR MODERATE (A) 06/05/2015 1559    STUDIES: No results found.    ASSESSMENT: 76 y.o. [place] woman   (1) status post right lumpectomy and sentinel lymph node biopsy in September 2002 for multifocal breast carcinoma, triple negative.  Treated with CMF followed by radiation.   (2) Local recurrence in April 2004, status post right modified radical mastectomy with TRAM reconstruction for what proved to be a T1c N0, stage IA triple negative breast carcinoma.  Treated  adjuvantly with paclitaxel and doxorubicin x4 in 2004.  Off treatment since August 2004 with no evidence of recurrence  (3) history of papillary thyroid cancer s/p thyroidectomy June 2007, s/p radioactive iodine July 2007  (4) status post left breast upper outer quadrant biopsy 08/21/2016 for a clinical T1b N0, stage IB invasive ductal carcinoma, grade 3, weakly estrogen receptor positive, progesterone receptor and HER-2 negative, with an MIB-1 of 80%.  (5) left lumpectomy and sentinel lymph node sampling 09/11/2016 found a pT1b pN0, stage IB invasive ductal carcinoma, grade 3, with negative margins.  (6) adjuvant chemotherapy consisting of carboplatin and gemcitabine given days 1 and 8 of each 21 day cycle 4 cycles, started 09/18/2016 (Neupogen on days 2 and 3, and Onpro on day 8 for chemotherapy induced neutropenia)  (7) adjuvant radiation to follow  (8) genetics testing pending.  PLAN:  Whitni is doing well today.  Her labs are stable and I reviewed these with her in detail.  She will proceed with chemotherapy today.  I reviewed her insomnia issue with Dr. Jana Hakim, and he is in agreement that she can stop receiving Dexamethasone as a pre-medication prior to chemotherapy.  She is very happy with this.  She and I reviewed her schedule, and I requested scheduling of the rest of her appointments today.  Jessenya verbalized understanding of the above.  She knows to call prior to her next appointment with [place] if she has any questions or concerns.   A total of (30) minutes of face-to-face time was spent with this patient with greater than 50% of that time in counseling and care-coordination.   Scot Dock    10/30/2016

## 2016-10-30 NOTE — Patient Instructions (Signed)

## 2016-11-01 ENCOUNTER — Ambulatory Visit: Payer: Medicare Other

## 2016-11-01 ENCOUNTER — Ambulatory Visit (HOSPITAL_BASED_OUTPATIENT_CLINIC_OR_DEPARTMENT_OTHER): Payer: Medicare Other

## 2016-11-01 VITALS — BP 111/77 | HR 108 | Temp 97.8°F | Resp 18

## 2016-11-01 DIAGNOSIS — Z5189 Encounter for other specified aftercare: Secondary | ICD-10-CM

## 2016-11-01 DIAGNOSIS — C50412 Malignant neoplasm of upper-outer quadrant of left female breast: Secondary | ICD-10-CM | POA: Diagnosis present

## 2016-11-01 DIAGNOSIS — Z170 Estrogen receptor positive status [ER+]: Principal | ICD-10-CM

## 2016-11-01 MED ORDER — PEGFILGRASTIM INJECTION 6 MG/0.6ML ~~LOC~~
6.0000 mg | PREFILLED_SYRINGE | Freq: Once | SUBCUTANEOUS | Status: AC
  Administered 2016-11-01: 6 mg via SUBCUTANEOUS
  Filled 2016-11-01: qty 0.6

## 2016-11-01 NOTE — Patient Instructions (Signed)
Pegfilgrastim injection What is this medicine? PEGFILGRASTIM (PEG fil gra stim) is a long-acting granulocyte colony-stimulating factor that stimulates the growth of neutrophils, a type of white blood cell important in the body's fight against infection. It is used to reduce the incidence of fever and infection in patients with certain types of cancer who are receiving chemotherapy that affects the bone marrow, and to increase survival after being exposed to high doses of radiation. This medicine may be used for other purposes; ask your health care provider or pharmacist if you have questions. COMMON BRAND NAME(S): Neulasta What should I tell my health care provider before I take this medicine? They need to know if you have any of these conditions: -kidney disease -latex allergy -ongoing radiation therapy -sickle cell disease -skin reactions to acrylic adhesives (On-Body Injector only) -an unusual or allergic reaction to pegfilgrastim, filgrastim, other medicines, foods, dyes, or preservatives -pregnant or trying to get pregnant -breast-feeding How should I use this medicine? This medicine is for injection under the skin. If you get this medicine at home, you will be taught how to prepare and give the pre-filled syringe or how to use the On-body Injector. Refer to the patient Instructions for Use for detailed instructions. Use exactly as directed. Tell your healthcare provider immediately if you suspect that the On-body Injector may not have performed as intended or if you suspect the use of the On-body Injector resulted in a missed or partial dose. It is important that you put your used needles and syringes in a special sharps container. Do not put them in a trash can. If you do not have a sharps container, call your pharmacist or healthcare provider to get one. Talk to your pediatrician regarding the use of this medicine in children. While this drug may be prescribed for selected conditions,  precautions do apply. Overdosage: If you think you have taken too much of this medicine contact a poison control center or emergency room at once. NOTE: This medicine is only for you. Do not share this medicine with others. What if I miss a dose? It is important not to miss your dose. Call your doctor or health care professional if you miss your dose. If you miss a dose due to an On-body Injector failure or leakage, a new dose should be administered as soon as possible using a single prefilled syringe for manual use. What may interact with this medicine? Interactions have not been studied. Give your health care provider a list of all the medicines, herbs, non-prescription drugs, or dietary supplements you use. Also tell them if you smoke, drink alcohol, or use illegal drugs. Some items may interact with your medicine. This list may not describe all possible interactions. Give your health care provider a list of all the medicines, herbs, non-prescription drugs, or dietary supplements you use. Also tell them if you smoke, drink alcohol, or use illegal drugs. Some items may interact with your medicine. What should I watch for while using this medicine? You may need blood work done while you are taking this medicine. If you are going to need a MRI, CT scan, or other procedure, tell your doctor that you are using this medicine (On-Body Injector only). What side effects may I notice from receiving this medicine? Side effects that you should report to your doctor or health care professional as soon as possible: -allergic reactions like skin rash, itching or hives, swelling of the face, lips, or tongue -dizziness -fever -pain, redness, or irritation at site   where injected -pinpoint red spots on the skin -red or dark-brown urine -shortness of breath or breathing problems -stomach or side pain, or pain at the shoulder -swelling -tiredness -trouble passing urine or change in the amount of urine Side  effects that usually do not require medical attention (report to your doctor or health care professional if they continue or are bothersome): -bone pain -muscle pain This list may not describe all possible side effects. Call your doctor for medical advice about side effects. You may report side effects to FDA at 1-800-FDA-1088. Where should I keep my medicine? Keep out of the reach of children. Store pre-filled syringes in a refrigerator between 2 and 8 degrees C (36 and 46 degrees F). Do not freeze. Keep in carton to protect from light. Throw away this medicine if it is left out of the refrigerator for more than 48 hours. Throw away any unused medicine after the expiration date. NOTE: This sheet is a summary. It may not cover all possible information. If you have questions about this medicine, talk to your doctor, pharmacist, or health care provider.  2018 Elsevier/Gold Standard (2016-01-25 12:58:03)  

## 2016-11-06 ENCOUNTER — Other Ambulatory Visit: Payer: Medicare Other

## 2016-11-06 ENCOUNTER — Ambulatory Visit: Payer: Medicare Other

## 2016-11-06 ENCOUNTER — Ambulatory Visit: Payer: Medicare Other | Admitting: Adult Health

## 2016-11-11 ENCOUNTER — Telehealth: Payer: Self-pay | Admitting: Oncology

## 2016-11-11 NOTE — Telephone Encounter (Signed)
Called patient regarding October appointments advised to request calendar at next appointment

## 2016-11-13 ENCOUNTER — Ambulatory Visit: Payer: Medicare Other

## 2016-11-13 ENCOUNTER — Telehealth: Payer: Self-pay | Admitting: Adult Health

## 2016-11-13 ENCOUNTER — Ambulatory Visit (HOSPITAL_BASED_OUTPATIENT_CLINIC_OR_DEPARTMENT_OTHER): Payer: Medicare Other | Admitting: Adult Health

## 2016-11-13 ENCOUNTER — Encounter: Payer: Self-pay | Admitting: Adult Health

## 2016-11-13 ENCOUNTER — Other Ambulatory Visit (HOSPITAL_BASED_OUTPATIENT_CLINIC_OR_DEPARTMENT_OTHER): Payer: Medicare Other

## 2016-11-13 ENCOUNTER — Ambulatory Visit (HOSPITAL_BASED_OUTPATIENT_CLINIC_OR_DEPARTMENT_OTHER): Payer: Medicare Other

## 2016-11-13 VITALS — BP 127/77 | HR 99 | Temp 98.6°F | Resp 17 | Ht 64.0 in | Wt 145.2 lb

## 2016-11-13 DIAGNOSIS — C50412 Malignant neoplasm of upper-outer quadrant of left female breast: Secondary | ICD-10-CM

## 2016-11-13 DIAGNOSIS — Z170 Estrogen receptor positive status [ER+]: Secondary | ICD-10-CM

## 2016-11-13 DIAGNOSIS — C50012 Malignant neoplasm of nipple and areola, left female breast: Secondary | ICD-10-CM

## 2016-11-13 DIAGNOSIS — Z5111 Encounter for antineoplastic chemotherapy: Secondary | ICD-10-CM | POA: Diagnosis not present

## 2016-11-13 DIAGNOSIS — Z95828 Presence of other vascular implants and grafts: Secondary | ICD-10-CM

## 2016-11-13 LAB — COMPREHENSIVE METABOLIC PANEL
ALBUMIN: 4 g/dL (ref 3.5–5.0)
ALT: 26 U/L (ref 0–55)
AST: 29 U/L (ref 5–34)
Alkaline Phosphatase: 156 U/L — ABNORMAL HIGH (ref 40–150)
Anion Gap: 11 mEq/L (ref 3–11)
BUN: 17.1 mg/dL (ref 7.0–26.0)
CO2: 22 mEq/L (ref 22–29)
CREATININE: 1.2 mg/dL — AB (ref 0.6–1.1)
Calcium: 9.3 mg/dL (ref 8.4–10.4)
Chloride: 108 mEq/L (ref 98–109)
EGFR: 45 mL/min/{1.73_m2} — ABNORMAL LOW (ref >90)
GLUCOSE: 109 mg/dL (ref 70–140)
Potassium: 3.7 mEq/L (ref 3.5–5.1)
SODIUM: 141 meq/L (ref 136–145)
TOTAL PROTEIN: 7.2 g/dL (ref 6.4–8.3)
Total Bilirubin: 0.59 mg/dL (ref 0.20–1.20)

## 2016-11-13 LAB — CBC WITH DIFFERENTIAL/PLATELET
BASO%: 0.7 % (ref 0.0–2.0)
BASOS ABS: 0.1 10*3/uL (ref 0.0–0.1)
EOS%: 1.2 % (ref 0.0–7.0)
Eosinophils Absolute: 0.1 10*3/uL (ref 0.0–0.5)
HEMATOCRIT: 31.9 % — AB (ref 34.8–46.6)
HEMOGLOBIN: 10.7 g/dL — AB (ref 11.6–15.9)
LYMPH#: 2 10*3/uL (ref 0.9–3.3)
LYMPH%: 16.8 % (ref 14.0–49.7)
MCH: 32 pg (ref 25.1–34.0)
MCHC: 33.5 g/dL (ref 31.5–36.0)
MCV: 95.7 fL (ref 79.5–101.0)
MONO#: 1.2 10*3/uL — ABNORMAL HIGH (ref 0.1–0.9)
MONO%: 10.3 % (ref 0.0–14.0)
NEUT%: 71 % (ref 38.4–76.8)
NEUTROS ABS: 8.3 10*3/uL — AB (ref 1.5–6.5)
Platelets: 272 10*3/uL (ref 145–400)
RBC: 3.34 10*6/uL — ABNORMAL LOW (ref 3.70–5.45)
RDW: 14.9 % — AB (ref 11.2–14.5)
WBC: 11.7 10*3/uL — AB (ref 3.9–10.3)

## 2016-11-13 MED ORDER — SODIUM CHLORIDE 0.9% FLUSH
10.0000 mL | INTRAVENOUS | Status: DC | PRN
  Administered 2016-11-13: 10 mL
  Filled 2016-11-13: qty 10

## 2016-11-13 MED ORDER — PALONOSETRON HCL INJECTION 0.25 MG/5ML
INTRAVENOUS | Status: AC
  Filled 2016-11-13: qty 5

## 2016-11-13 MED ORDER — SODIUM CHLORIDE 0.9 % IV SOLN
130.0000 mg | Freq: Once | INTRAVENOUS | Status: AC
  Administered 2016-11-13: 130 mg via INTRAVENOUS
  Filled 2016-11-13: qty 13

## 2016-11-13 MED ORDER — SODIUM CHLORIDE 0.9% FLUSH
10.0000 mL | Freq: Once | INTRAVENOUS | Status: AC
  Administered 2016-11-13: 10 mL
  Filled 2016-11-13: qty 10

## 2016-11-13 MED ORDER — PALONOSETRON HCL INJECTION 0.25 MG/5ML
0.2500 mg | Freq: Once | INTRAVENOUS | Status: AC
  Administered 2016-11-13: 0.25 mg via INTRAVENOUS

## 2016-11-13 MED ORDER — SODIUM CHLORIDE 0.9 % IV SOLN
800.0000 mg/m2 | Freq: Once | INTRAVENOUS | Status: AC
  Administered 2016-11-13: 1368 mg via INTRAVENOUS
  Filled 2016-11-13: qty 35.98

## 2016-11-13 MED ORDER — SODIUM CHLORIDE 0.9 % IV SOLN
Freq: Once | INTRAVENOUS | Status: AC
  Administered 2016-11-13: 10:00:00 via INTRAVENOUS

## 2016-11-13 MED ORDER — HEPARIN SOD (PORK) LOCK FLUSH 100 UNIT/ML IV SOLN
500.0000 [IU] | Freq: Once | INTRAVENOUS | Status: AC | PRN
  Administered 2016-11-13: 500 [IU]
  Filled 2016-11-13: qty 5

## 2016-11-13 NOTE — Patient Instructions (Signed)
Implanted Port Home Guide An implanted port is a type of central line that is placed under the skin. Central lines are used to provide IV access when treatment or nutrition needs to be given through a person's veins. Implanted ports are used for long-term IV access. An implanted port may be placed because:  You need IV medicine that would be irritating to the small veins in your hands or arms.  You need long-term IV medicines, such as antibiotics.  You need IV nutrition for a long period.  You need frequent blood draws for lab tests.  You need dialysis.  Implanted ports are usually placed in the chest area, but they can also be placed in the upper arm, the abdomen, or the leg. An implanted port has two main parts:  Reservoir. The reservoir is round and will appear as a small, raised area under your skin. The reservoir is the part where a needle is inserted to give medicines or draw blood.  Catheter. The catheter is a thin, flexible tube that extends from the reservoir. The catheter is placed into a large vein. Medicine that is inserted into the reservoir goes into the catheter and then into the vein.  How will I care for my incision site? Do not get the incision site wet. Bathe or shower as directed by your health care provider. How is my port accessed? Special steps must be taken to access the port:  Before the port is accessed, a numbing cream can be placed on the skin. This helps numb the skin over the port site.  Your health care provider uses a sterile technique to access the port. ? Your health care provider must put on a mask and sterile gloves. ? The skin over your port is cleaned carefully with an antiseptic and allowed to dry. ? The port is gently pinched between sterile gloves, and a needle is inserted into the port.  Only "non-coring" port needles should be used to access the port. Once the port is accessed, a blood return should be checked. This helps ensure that the port  is in the vein and is not clogged.  If your port needs to remain accessed for a constant infusion, a clear (transparent) bandage will be placed over the needle site. The bandage and needle will need to be changed every week, or as directed by your health care provider.  Keep the bandage covering the needle clean and dry. Do not get it wet. Follow your health care provider's instructions on how to take a shower or bath while the port is accessed.  If your port does not need to stay accessed, no bandage is needed over the port.  What is flushing? Flushing helps keep the port from getting clogged. Follow your health care provider's instructions on how and when to flush the port. Ports are usually flushed with saline solution or a medicine called heparin. The need for flushing will depend on how the port is used.  If the port is used for intermittent medicines or blood draws, the port will need to be flushed: ? After medicines have been given. ? After blood has been drawn. ? As part of routine maintenance.  If a constant infusion is running, the port may not need to be flushed.  How long will my port stay implanted? The port can stay in for as long as your health care provider thinks it is needed. When it is time for the port to come out, surgery will be   done to remove it. The procedure is similar to the one performed when the port was put in. When should I seek immediate medical care? When you have an implanted port, you should seek immediate medical care if:  You notice a bad smell coming from the incision site.  You have swelling, redness, or drainage at the incision site.  You have more swelling or pain at the port site or the surrounding area.  You have a fever that is not controlled with medicine.  This information is not intended to replace advice given to you by your health care provider. Make sure you discuss any questions you have with your health care provider. Document  Released: 01/28/2005 Document Revised: 07/06/2015 Document Reviewed: 10/05/2012 Elsevier Interactive Patient Education  2017 Elsevier Inc.  

## 2016-11-13 NOTE — Telephone Encounter (Signed)
Scheduled appt per 10/3 los - patient to get an updated schedule in the treatment area.

## 2016-11-13 NOTE — Patient Instructions (Addendum)
[facility] Discharge Instructions for Patients Receiving Chemotherapy  Today you received the following chemotherapy agents gemcitabine (Gemzar) and carboplatin  To help prevent nausea and vomiting after your treatment, we encourage you to take your nausea medication as directed by your MD.   If you develop nausea and vomiting that is not controlled by your nausea medication, call the clinic.   BELOW ARE SYMPTOMS THAT SHOULD BE REPORTED IMMEDIATELY:  *FEVER GREATER THAN 100.5 F  *CHILLS WITH OR WITHOUT FEVER  NAUSEA AND VOMITING THAT IS NOT CONTROLLED WITH YOUR NAUSEA MEDICATION  *UNUSUAL SHORTNESS OF BREATH  *UNUSUAL BRUISING OR BLEEDING  TENDERNESS IN MOUTH AND THROAT WITH OR WITHOUT PRESENCE OF ULCERS  *URINARY PROBLEMS  *BOWEL PROBLEMS  UNUSUAL RASH Items with * indicate a potential emergency and should be followed up as soon as possible.  Feel free to call the clinic you have any questions or concerns. The clinic phone number is [phone].  Please show the [facility] at check-in to the Emergency Department and triage nurse.

## 2016-11-13 NOTE — Progress Notes (Signed)
. [name]   DOB: [birth year]  MR#: [id number]  CSN#:[id number]  PCP: [name], MD Patient Care Team: [name], MD as PCP - General Magrinat, [name], MD as Consulting Physician (Oncology) [name], MD as Consulting Physician (General Surgery) [name], MD as Consulting Physician (Gastroenterology)   CHIEF COMPLAINT: Weakly estrogen receptor positive breast cancer  CURRENT TREATMENT: Adjuvant chemotherapy   INTERVAL HISTORY: Hortencia returns today for follow-up and treatment of her weakly estrogen receptor positive left sided breast cancer. Today is day 1 cycle 3 of 4 planned cycles of carboplatin and gemcitabine which she receives on days 1 and 8 of each 21 day cycle with Neupogen on days 2 and 3, and Neulasta in the form of Onpro on day 8 of every cycle.    REVIEW OF SYSTEMS: Abel is doing well today.  She continues to tolerate chemotherapy well.  She does want me to look at her lumpectomy site as she wants to make sure what she is feeling is scar tissue.   BREAST CANCER HISTORY: From the recent summary note:  I last saw Marella in 2013 when she was released from follow-up from her right breast cancer recurrence in 2004. She is status post right mastectomy and adjuvant chemotherapy for that triple negative tumor. More recently, on 07/16/2016 she underwent left screening mammography at the [facility] on 07/16/2016. This found the breast density to be category B. There was a possible asymmetry in the left breast, and on 07/19/2016 she underwent left diagnostic mammography with tomography and left breast ultrasonography. This confirmed a well circumscribed oval mass at the 9:00 position of the left breast measuring approximately 0.8 cm. This was not palpable. On ultrasonography the mass measured 0.6 cm and it was located at the 9:00 radiant 4 cm from the nipple. The left axilla was sonographically benign.  On June 11 Bryony underwent biopsy of the  left breast mass in question and this showed (SAA [phone]) and invasive ductal carcinoma, grade 3, estrogen receptor 5% positive with weak staining intensity, progesterone receptor negative, with an MIB-1 of 80%, and HER-2 nonamplified, the signals ratio being 1.46 and the number per cell 1.90.  Her subsequent history is as detailed below.   PAST MEDICAL HISTORY: Past Medical History:  Diagnosis Date  . Allergic rhinitis   . Anxiety   . Breast cancer ([place]) hx 2004   recurrent 2006 DR Magrinat  . HTN (hypertension)   . Hyperlipidemia   . Hypothyroidism   . Personal history of chemotherapy 2002  . Personal history of radiation therapy 2002  . Psoriasis   . S/P thyroidectomy 07/2005   2 cm largest diameter (1 other tiny focus)/ i-131 rx 99 [facility] 08/2005  . Thyroid cancer ([place])    Papillary Stage 1 - Dr Loanne Drilling  . Vitamin B12 deficiency 2009  . Vitamin D deficiency 2009    PAST SURGICAL HISTORY: Past Surgical History:  Procedure Laterality Date  . BREAST LUMPECTOMY    . BREAST LUMPECTOMY WITH RADIOACTIVE SEED AND SENTINEL LYMPH NODE BIOPSY Left 09/11/2016   Procedure: LEFT BREAST LUMPECTOMY WITH RADIOACTIVE SEED AND LEFT AXILLARY SENTINEL LYMPH NODE BIOPSY WITH BLUE DYE INJECTION;  Surgeon: [name], MD;  Location: [facility];  Service: General;  Laterality: Left;  . IR FLUORO GUIDE PORT INSERTION RIGHT  09/13/2016  . IR US GUIDE VASC ACCESS RIGHT  09/13/2016  . MASTECTOMY     Right  . PORTACATH PLACEMENT N/A 09/11/2016  Procedure: ATTEMPTED INSERTION PORT-A-CATH WITH ULTRA SOUND GUIDANCE;  Surgeon: [name], MD;  Location: [facility];  Service: General;  Laterality: N/A;  . REDUCTION MAMMAPLASTY Left 2005  . THYROIDECTOMY  2007    FAMILY HISTORY Family History  Problem Relation Age of Onset  . Stroke Mother   . Allergies Mother   . Asthma Mother   . Clotting disorder Mother   . Heart disease Father 103       MI  . Allergies Sister   .  Asthma Sister   . Cancer Sister        Breast  . Asthma Brother   . Allergies Daughter   . Asthma Daughter   . Allergies Sister   Family history includes a sister, a paternal aunt, and a paternal cousin with breast cancer all older then 49 at diagnosis  GYN HISTORY: Menarche age 40, first live birth age 37. She is GX P3. She went through the change of life age 40. She did not take hormone replacement.  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 Her husband [name] is retired from [employer]. Her children are [name], who works for time [employer] in [place], and Browning, who lives in [place] and works for [employer]. The third child, [name], died in an automobile accident at age 41. The patient has one grandchild. 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 Smoking status: Never Smoker  . Smokeless tobacco: Never Used  . Alcohol use No     Colonoscopy:  PAP:  Bone density:  Lipid panel:  Allergies  Allergen Reactions  . Pneumococcal Vaccines Other (See Comments)    Was really sick   . Sulfa Antibiotics Other (See Comments)    Pt believes it was hives  . Sulfacetamide Sodium-Sulfur   . Tape Itching, Dermatitis and Rash  . Tegaderm Ag Mesh [Silver] Itching and Rash    Current Outpatient Prescriptions  Medication Sig Dispense Refill  . ALPRAZolam (XANAX) 0.25 MG tablet Take 0.125-0.25 mg by mouth 2 (two) times daily as needed for anxiety or sleep (Pt takes a half of a tablet every evening).     [name] amoxicillin (AMOXIL) 500 MG tablet Take 500 mg by mouth 1 day or 1 dose.    . cholecalciferol (VITAMIN D) 1000 units tablet Take 1,000 Units by mouth daily.    [name] dexamethasone (DECADRON) 4 MG tablet Take 1 tablet twice a day with food AS NEEDED for nausea 30 tablet 1  . DULoxetine (CYMBALTA) 60 MG capsule Take 60 mg by mouth every morning.     [name] levothyroxine  (SYNTHROID, LEVOTHROID) 112 MCG tablet TAKE 1 TABLET (112 MCG TOTAL) BY MOUTH DAILY. 90 tablet 3  . lidocaine-prilocaine (EMLA) cream Apply to affected area once 30 g 3  . losartan (COZAAR) 100 MG tablet TAKE 1 TABLET BY MOUTH EVERY DAY FOR BLOOD PRESSURE 90 tablet 1  . prochlorperazine (COMPAZINE) 10 MG tablet Take before meals and at bedtime day after chemo, then as needed for nausea 30 tablet 1  . rosuvastatin (CRESTOR) 20 MG tablet Take 1 tablet (20 mg total) by mouth daily. 90 tablet 2  . Syringe/Needle, Disp, (B-D ECLIPSE SYRINGE) 30G X 1/2" 1 ML MISC 1 each by Does not apply route 1 day or 1 dose. For Vit B12 inj 50 each 11  . vitamin B-12 (CYANOCOBALAMIN) 1000  MCG tablet Take 1,000 mcg by mouth every other day.     No current facility-administered medications for this visit.     OBJECTIVE:   Vitals:   11/13/16 0910  BP: 127/77  Pulse: 99  Resp: 17  Temp: 98.6 F (37 C)  SpO2: 99%     Body mass index is 24.92 kg/m.    ECOG FS: 1  GENERAL: Patient is a well appearing female in no acute distress HEENT:  Sclerae anicteric. PERRL  Oropharynx clear and moist. No ulcerations or evidence of oropharyngeal candidiasis. Neck is supple. There is no swelling noted along her left lower mandible or tenderness noted to palpation NODES:  No cervical, supraclavicular, or axillary lymphadenopathy palpated.  BREAST EXAM:  Thickness at left lumpectomy site, likely scar tissue LUNGS:  Clear to auscultation bilaterally.  No wheezes or rhonchi. HEART:  Regular rate and rhythm. No murmur appreciated. ABDOMEN:  Soft, nontender.  Positive, normoactive bowel sounds. No organomegaly palpated. MSK:  No focal spinal tenderness to palpation. Full range of motion bilaterally in the upper extremities. EXTREMITIES:  No peripheral edema.   SKIN:  Clear with no obvious rashes or skin changes. No nail dyscrasia. NEURO:  Nonfocal. Well oriented.  Appropriate affect.      LAB RESULTS: Lab Results   Component Value Date   WBC 11.7 (H) 11/13/2016   NEUTROABS 8.3 (H) 11/13/2016   HGB 10.7 (L) 11/13/2016   HCT 31.9 (L) 11/13/2016   MCV 95.7 11/13/2016   PLT 272 11/13/2016      Chemistry      Component Value Date/Time   NA 141 11/13/2016 0834   K 3.7 11/13/2016 0834   CL 108 09/13/2016 0728   CL 104 10/09/2011 1403   CO2 22 11/13/2016 0834   BUN 17.1 11/13/2016 0834   CREATININE 1.2 (H) 11/13/2016 0834      Component Value Date/Time   CALCIUM 9.3 11/13/2016 0834   ALKPHOS 156 (H) 11/13/2016 0834   AST 29 11/13/2016 0834   ALT 26 11/13/2016 0834   BILITOT 0.59 11/13/2016 0834       Lab Results  Component Value Date   LABCA2 16 10/09/2011    No components found for: [id number]  No results for input(s): INR in the last 168 hours.  Urinalysis    Component Value Date/Time   COLORURINE YELLOW 06/05/2015 1559   APPEARANCEUR CLEAR 06/05/2015 1559   LABSPEC 1.020 06/05/2015 1559   PHURINE 5.5 06/05/2015 1559   GLUCOSEU NEGATIVE 06/05/2015 1559   HGBUR NEGATIVE 06/05/2015 1559   BILIRUBINUR NEGATIVE 06/05/2015 1559   KETONESUR NEGATIVE 06/05/2015 1559   UROBILINOGEN 0.2 06/05/2015 1559   NITRITE NEGATIVE 06/05/2015 1559   LEUKOCYTESUR MODERATE (A) 06/05/2015 1559    STUDIES: No results found.    ASSESSMENT: 76 y.o. [place] woman   (1) status post right lumpectomy and sentinel lymph node biopsy in September 2002 for multifocal breast carcinoma, triple negative.  Treated with CMF followed by radiation.   (2) Local recurrence in April 2004, status post right modified radical mastectomy with TRAM reconstruction for what proved to be a T1c N0, stage IA triple negative breast carcinoma.  Treated adjuvantly with paclitaxel and doxorubicin x4 in 2004.  Off treatment since August 2004 with no evidence of recurrence  (3) history of papillary thyroid cancer s/p thyroidectomy June 2007, s/p radioactive iodine July 2007  (4) status post left breast upper outer  quadrant biopsy 08/21/2016 for a clinical T1b N0, stage IB invasive ductal carcinoma,  grade 3, weakly estrogen receptor positive, progesterone receptor and HER-2 negative, with an MIB-1 of 80%.  (5) left lumpectomy and sentinel lymph node sampling 09/11/2016 found a pT1b pN0, stage IB invasive ductal carcinoma, grade 3, with negative margins.  (6) adjuvant chemotherapy consisting of carboplatin and gemcitabine given days 1 and 8 of each 21 day cycle 4 cycles, started 09/18/2016 (Neupogen on days 2 and 3, and Onpro on day 8 for chemotherapy induced neutropenia)  (7) adjuvant radiation to follow  (8) genetics testing pending.  PLAN:  Fredda is doing well today.  I reviewed her labs with her which are stable.  She will proceed with cycle 3 of Carboplatin Gemcitabine today.  I did review this area on her breast with her in detail.  She says that she feels she needs to f/u with her surgeon, but didn't "click" with Dr. Dalbert Batman, and she wants to f/u with a different surgeon.  She denies any other issues today. Calayah will return in one week for labs, flush and chemotherapy.   Larrisa verbalized understanding of the above.  She knows to call prior to her next appointment with [place] if she has any questions or concerns.   A total of (20) minutes of face-to-face time was spent with this patient with greater than 50% of that time in counseling and care-coordination.   Scot Dock    11/13/2016

## 2016-11-14 ENCOUNTER — Ambulatory Visit: Payer: Medicare Other

## 2016-11-15 ENCOUNTER — Ambulatory Visit (HOSPITAL_BASED_OUTPATIENT_CLINIC_OR_DEPARTMENT_OTHER): Payer: Medicare Other

## 2016-11-15 ENCOUNTER — Ambulatory Visit: Payer: Medicare Other

## 2016-11-15 VITALS — BP 106/71 | HR 105 | Temp 98.1°F | Resp 18

## 2016-11-15 DIAGNOSIS — Z5189 Encounter for other specified aftercare: Secondary | ICD-10-CM | POA: Diagnosis not present

## 2016-11-15 DIAGNOSIS — C50412 Malignant neoplasm of upper-outer quadrant of left female breast: Secondary | ICD-10-CM | POA: Diagnosis present

## 2016-11-15 DIAGNOSIS — Z170 Estrogen receptor positive status [ER+]: Principal | ICD-10-CM

## 2016-11-15 MED ORDER — TBO-FILGRASTIM 300 MCG/0.5ML ~~LOC~~ SOSY
300.0000 ug | PREFILLED_SYRINGE | Freq: Once | SUBCUTANEOUS | Status: AC
  Administered 2016-11-15: 300 ug via SUBCUTANEOUS
  Filled 2016-11-15: qty 0.5

## 2016-11-16 ENCOUNTER — Other Ambulatory Visit: Payer: Self-pay | Admitting: *Deleted

## 2016-11-16 ENCOUNTER — Ambulatory Visit (HOSPITAL_BASED_OUTPATIENT_CLINIC_OR_DEPARTMENT_OTHER): Payer: Medicare Other

## 2016-11-16 VITALS — BP 124/73 | HR 106 | Temp 98.3°F | Resp 18

## 2016-11-16 DIAGNOSIS — Z170 Estrogen receptor positive status [ER+]: Principal | ICD-10-CM

## 2016-11-16 DIAGNOSIS — C50412 Malignant neoplasm of upper-outer quadrant of left female breast: Secondary | ICD-10-CM

## 2016-11-16 DIAGNOSIS — D701 Agranulocytosis secondary to cancer chemotherapy: Secondary | ICD-10-CM

## 2016-11-16 MED ORDER — TBO-FILGRASTIM 300 MCG/0.5ML ~~LOC~~ SOSY
300.0000 ug | PREFILLED_SYRINGE | Freq: Once | SUBCUTANEOUS | Status: AC
  Administered 2016-11-16: 300 ug via SUBCUTANEOUS

## 2016-11-16 NOTE — Patient Instructions (Signed)
Tbo-Filgrastim injection What is this medicine? TBO-FILGRASTIM (T B O fil GRA stim) is a granulocyte colony-stimulating factor that stimulates the growth of neutrophils, a type of white blood cell important in the body's fight against infection. It is used to reduce the incidence of fever and infection in patients with certain types of cancer who are receiving chemotherapy that affects the bone marrow. This medicine may be used for other purposes; ask your health care provider or pharmacist if you have questions. COMMON BRAND NAME(S): Granix What should I tell my health care provider before I take this medicine? They need to know if you have any of these conditions: -bone scan or tests planned -kidney disease -sickle cell anemia -an unusual or allergic reaction to tbo-filgrastim, filgrastim, pegfilgrastim, other medicines, foods, dyes, or preservatives -pregnant or trying to get pregnant -breast-feeding How should I use this medicine? This medicine is for injection under the skin. If you get this medicine at home, you will be taught how to prepare and give this medicine. Refer to the Instructions for Use that come with your medication packaging. Use exactly as directed. Take your medicine at regular intervals. Do not take your medicine more often than directed. It is important that you put your used needles and syringes in a special sharps container. Do not put them in a trash can. If you do not have a sharps container, call your pharmacist or healthcare provider to get one. Talk to your pediatrician regarding the use of this medicine in children. Special care may be needed. Overdosage: If you think you have taken too much of this medicine contact a poison control center or emergency room at once. NOTE: This medicine is only for you. Do not share this medicine with others. What if I miss a dose? It is important not to miss your dose. Call your doctor or health care professional if you miss a  dose. What may interact with this medicine? This medicine may interact with the following medications: -medicines that may cause a release of neutrophils, such as lithium This list may not describe all possible interactions. Give your health care provider a list of all the medicines, herbs, non-prescription drugs, or dietary supplements you use. Also tell them if you smoke, drink alcohol, or use illegal drugs. Some items may interact with your medicine. What should I watch for while using this medicine? You may need blood work done while you are taking this medicine. What side effects may I notice from receiving this medicine? Side effects that you should report to your doctor or health care professional as soon as possible: -allergic reactions like skin rash, itching or hives, swelling of the face, lips, or tongue -blood in the urine -dark urine -dizziness -fast heartbeat -feeling faint -shortness of breath or breathing problems -signs and symptoms of infection like fever or chills; cough; or sore throat -signs and symptoms of kidney injury like trouble passing urine or change in the amount of urine -stomach or side pain, or pain at the shoulder -sweating -swelling of the legs, ankles, or abdomen -tiredness Side effects that usually do not require medical attention (report to your doctor or health care professional if they continue or are bothersome): -bone pain -headache -muscle pain -vomiting This list may not describe all possible side effects. Call your doctor for medical advice about side effects. You may report side effects to FDA at 1-800-FDA-1088. Where should I keep my medicine? Keep out of the reach of children. Store in a refrigerator between   2 and 8 degrees C (36 and 46 degrees F). Keep in carton to protect from light. Throw away this medicine if it is left out of the refrigerator for more than 5 consecutive days. Throw away any unused medicine after the expiration  date. NOTE: This sheet is a summary. It may not cover all possible information. If you have questions about this medicine, talk to your doctor, pharmacist, or health care provider.  2018 [employer]/Gold Standard (2015-03-20 19:07:04)  

## 2016-11-20 ENCOUNTER — Ambulatory Visit (HOSPITAL_BASED_OUTPATIENT_CLINIC_OR_DEPARTMENT_OTHER): Payer: Medicare Other

## 2016-11-20 ENCOUNTER — Telehealth: Payer: Self-pay | Admitting: Oncology

## 2016-11-20 ENCOUNTER — Other Ambulatory Visit (HOSPITAL_BASED_OUTPATIENT_CLINIC_OR_DEPARTMENT_OTHER): Payer: Medicare Other

## 2016-11-20 ENCOUNTER — Other Ambulatory Visit: Payer: Medicare Other

## 2016-11-20 ENCOUNTER — Ambulatory Visit: Payer: Medicare Other | Admitting: Oncology

## 2016-11-20 DIAGNOSIS — C50412 Malignant neoplasm of upper-outer quadrant of left female breast: Secondary | ICD-10-CM

## 2016-11-20 DIAGNOSIS — Z170 Estrogen receptor positive status [ER+]: Principal | ICD-10-CM

## 2016-11-20 DIAGNOSIS — C50012 Malignant neoplasm of nipple and areola, left female breast: Secondary | ICD-10-CM

## 2016-11-20 DIAGNOSIS — Z5111 Encounter for antineoplastic chemotherapy: Secondary | ICD-10-CM

## 2016-11-20 LAB — COMPREHENSIVE METABOLIC PANEL
ALK PHOS: 133 U/L (ref 40–150)
ALT: 52 U/L (ref 0–55)
ANION GAP: 7 meq/L (ref 3–11)
AST: 57 U/L — ABNORMAL HIGH (ref 5–34)
Albumin: 4 g/dL (ref 3.5–5.0)
BILIRUBIN TOTAL: 0.61 mg/dL (ref 0.20–1.20)
BUN: 14.7 mg/dL (ref 7.0–26.0)
CALCIUM: 9.2 mg/dL (ref 8.4–10.4)
CHLORIDE: 107 meq/L (ref 98–109)
CO2: 26 mEq/L (ref 22–29)
CREATININE: 1 mg/dL (ref 0.6–1.1)
EGFR: 57 mL/min/{1.73_m2} — ABNORMAL LOW (ref >60)
Glucose: 81 mg/dl (ref 70–140)
Potassium: 3.9 mEq/L (ref 3.5–5.1)
Sodium: 140 mEq/L (ref 136–145)
Total Protein: 7.2 g/dL (ref 6.4–8.3)

## 2016-11-20 LAB — CBC WITH DIFFERENTIAL/PLATELET
BASO%: 0.3 % (ref 0.0–2.0)
BASOS ABS: 0 10*3/uL (ref 0.0–0.1)
EOS%: 0.5 % (ref 0.0–7.0)
Eosinophils Absolute: 0 10*3/uL (ref 0.0–0.5)
HEMATOCRIT: 30.5 % — AB (ref 34.8–46.6)
HGB: 10.1 g/dL — ABNORMAL LOW (ref 11.6–15.9)
LYMPH#: 1.4 10*3/uL (ref 0.9–3.3)
LYMPH%: 23 % (ref 14.0–49.7)
MCH: 31.7 pg (ref 25.1–34.0)
MCHC: 33.1 g/dL (ref 31.5–36.0)
MCV: 95.6 fL (ref 79.5–101.0)
MONO#: 1.1 10*3/uL — AB (ref 0.1–0.9)
MONO%: 17.8 % — ABNORMAL HIGH (ref 0.0–14.0)
NEUT#: 3.6 10*3/uL (ref 1.5–6.5)
NEUT%: 58.4 % (ref 38.4–76.8)
PLATELETS: 193 10*3/uL (ref 145–400)
RBC: 3.19 10*6/uL — ABNORMAL LOW (ref 3.70–5.45)
RDW: 15.3 % — ABNORMAL HIGH (ref 11.2–14.5)
WBC: 6.1 10*3/uL (ref 3.9–10.3)

## 2016-11-20 MED ORDER — SODIUM CHLORIDE 0.9 % IV SOLN
146.4000 mg | Freq: Once | INTRAVENOUS | Status: AC
  Administered 2016-11-20: 150 mg via INTRAVENOUS
  Filled 2016-11-20: qty 15

## 2016-11-20 MED ORDER — HEPARIN SOD (PORK) LOCK FLUSH 100 UNIT/ML IV SOLN
500.0000 [IU] | Freq: Once | INTRAVENOUS | Status: AC | PRN
  Administered 2016-11-20: 500 [IU]
  Filled 2016-11-20: qty 5

## 2016-11-20 MED ORDER — SODIUM CHLORIDE 0.9% FLUSH
10.0000 mL | INTRAVENOUS | Status: DC | PRN
  Administered 2016-11-20: 10 mL
  Filled 2016-11-20: qty 10

## 2016-11-20 MED ORDER — PALONOSETRON HCL INJECTION 0.25 MG/5ML
INTRAVENOUS | Status: AC
  Filled 2016-11-20: qty 5

## 2016-11-20 MED ORDER — SODIUM CHLORIDE 0.9 % IV SOLN
Freq: Once | INTRAVENOUS | Status: AC
  Administered 2016-11-20: 13:00:00 via INTRAVENOUS

## 2016-11-20 MED ORDER — SODIUM CHLORIDE 0.9 % IV SOLN
800.0000 mg/m2 | Freq: Once | INTRAVENOUS | Status: AC
  Administered 2016-11-20: 1368 mg via INTRAVENOUS
  Filled 2016-11-20: qty 35.98

## 2016-11-20 MED ORDER — PALONOSETRON HCL INJECTION 0.25 MG/5ML
0.2500 mg | Freq: Once | INTRAVENOUS | Status: AC
  Administered 2016-11-20: 0.25 mg via INTRAVENOUS

## 2016-11-20 NOTE — Telephone Encounter (Signed)
Left message for patient regarding added appt per 10/10 sch msg.

## 2016-11-20 NOTE — Patient Instructions (Signed)
[facility] Discharge Instructions for Patients Receiving Chemotherapy  Today you received the following chemotherapy agents Gemzar and Carboplatin   To help prevent nausea and vomiting after your treatment, we encourage you to take your nausea medication as directed.    If you develop nausea and vomiting that is not controlled by your nausea medication, call the clinic.   BELOW ARE SYMPTOMS THAT SHOULD BE REPORTED IMMEDIATELY:  *FEVER GREATER THAN 100.5 F  *CHILLS WITH OR WITHOUT FEVER  NAUSEA AND VOMITING THAT IS NOT CONTROLLED WITH YOUR NAUSEA MEDICATION  *UNUSUAL SHORTNESS OF BREATH  *UNUSUAL BRUISING OR BLEEDING  TENDERNESS IN MOUTH AND THROAT WITH OR WITHOUT PRESENCE OF ULCERS  *URINARY PROBLEMS  *BOWEL PROBLEMS  UNUSUAL RASH Items with * indicate a potential emergency and should be followed up as soon as possible.  Feel free to call the clinic should you have any questions or concerns. The clinic phone number is [phone].  Please show the CHEMO ALERT CARD at check-in to the Emergency Department and triage nurse.   

## 2016-11-20 NOTE — Telephone Encounter (Signed)
Spoke with patient regarding her appt that was added per 10/10 sch msg

## 2016-11-22 ENCOUNTER — Ambulatory Visit: Payer: Medicare Other

## 2016-11-22 ENCOUNTER — Telehealth: Payer: Self-pay

## 2016-11-22 ENCOUNTER — Other Ambulatory Visit: Payer: Self-pay

## 2016-11-22 ENCOUNTER — Other Ambulatory Visit: Payer: Self-pay | Admitting: *Deleted

## 2016-11-22 ENCOUNTER — Telehealth: Payer: Self-pay | Admitting: Oncology

## 2016-11-22 NOTE — Telephone Encounter (Signed)
Pt called stating she cannot make her injection appt today d/t her power is out and her car is locked in her garage.  Pt unable to come in on Saturday as well.  Pt's states she can come in on Mon 10/15.  Pt's next chemo appt is on 10/24.  Per Dr Jana Hakim this is not enough time in between neulasta and next chemo therefore pt will need to get neupogen x 2 days instead.  Appt's made for 10/15 and 10/16.  Pt called and made aware of changes.  Pt verbalizes understanding.  Orders for neupogen placed.

## 2016-11-22 NOTE — Telephone Encounter (Signed)
Patient canceled 10/12 appointment. Appointments rescheduled 10/15 and 10/16.

## 2016-11-23 ENCOUNTER — Other Ambulatory Visit: Payer: Self-pay | Admitting: Oncology

## 2016-11-25 ENCOUNTER — Ambulatory Visit (HOSPITAL_BASED_OUTPATIENT_CLINIC_OR_DEPARTMENT_OTHER): Payer: Medicare Other

## 2016-11-25 VITALS — BP 113/70 | HR 93 | Temp 96.9°F | Resp 18

## 2016-11-25 DIAGNOSIS — Z95828 Presence of other vascular implants and grafts: Secondary | ICD-10-CM

## 2016-11-25 DIAGNOSIS — C50412 Malignant neoplasm of upper-outer quadrant of left female breast: Secondary | ICD-10-CM | POA: Diagnosis present

## 2016-11-25 DIAGNOSIS — Z5189 Encounter for other specified aftercare: Secondary | ICD-10-CM | POA: Diagnosis not present

## 2016-11-25 DIAGNOSIS — Z170 Estrogen receptor positive status [ER+]: Secondary | ICD-10-CM

## 2016-11-25 MED ORDER — FILGRASTIM 300 MCG/0.5ML IJ SOSY
300.0000 ug | PREFILLED_SYRINGE | Freq: Once | INTRAMUSCULAR | Status: DC
  Filled 2016-11-25: qty 0.5

## 2016-11-25 MED ORDER — TBO-FILGRASTIM 300 MCG/0.5ML ~~LOC~~ SOSY
300.0000 ug | PREFILLED_SYRINGE | Freq: Once | SUBCUTANEOUS | Status: AC
  Administered 2016-11-25: 300 ug via SUBCUTANEOUS
  Filled 2016-11-25: qty 0.5

## 2016-11-25 NOTE — Patient Instructions (Signed)
Tbo-Filgrastim injection What is this medicine? TBO-FILGRASTIM (T B O fil GRA stim) is a granulocyte colony-stimulating factor that stimulates the growth of neutrophils, a type of white blood cell important in the body's fight against infection. It is used to reduce the incidence of fever and infection in patients with certain types of cancer who are receiving chemotherapy that affects the bone marrow. This medicine may be used for other purposes; ask your health care provider or pharmacist if you have questions. COMMON BRAND NAME(S): Granix What should I tell my health care provider before I take this medicine? They need to know if you have any of these conditions: -bone scan or tests planned -kidney disease -sickle cell anemia -an unusual or allergic reaction to tbo-filgrastim, filgrastim, pegfilgrastim, other medicines, foods, dyes, or preservatives -pregnant or trying to get pregnant -breast-feeding How should I use this medicine? This medicine is for injection under the skin. If you get this medicine at home, you will be taught how to prepare and give this medicine. Refer to the Instructions for Use that come with your medication packaging. Use exactly as directed. Take your medicine at regular intervals. Do not take your medicine more often than directed. It is important that you put your used needles and syringes in a special sharps container. Do not put them in a trash can. If you do not have a sharps container, call your pharmacist or healthcare provider to get one. Talk to your pediatrician regarding the use of this medicine in children. Special care may be needed. Overdosage: If you think you have taken too much of this medicine contact a poison control center or emergency room at once. NOTE: This medicine is only for you. Do not share this medicine with others. What if I miss a dose? It is important not to miss your dose. Call your doctor or health care professional if you miss a  dose. What may interact with this medicine? This medicine may interact with the following medications: -medicines that may cause a release of neutrophils, such as lithium This list may not describe all possible interactions. Give your health care provider a list of all the medicines, herbs, non-prescription drugs, or dietary supplements you use. Also tell them if you smoke, drink alcohol, or use illegal drugs. Some items may interact with your medicine. What should I watch for while using this medicine? You may need blood work done while you are taking this medicine. What side effects may I notice from receiving this medicine? Side effects that you should report to your doctor or health care professional as soon as possible: -allergic reactions like skin rash, itching or hives, swelling of the face, lips, or tongue -blood in the urine -dark urine -dizziness -fast heartbeat -feeling faint -shortness of breath or breathing problems -signs and symptoms of infection like fever or chills; cough; or sore throat -signs and symptoms of kidney injury like trouble passing urine or change in the amount of urine -stomach or side pain, or pain at the shoulder -sweating -swelling of the legs, ankles, or abdomen -tiredness Side effects that usually do not require medical attention (report to your doctor or health care professional if they continue or are bothersome): -bone pain -headache -muscle pain -vomiting This list may not describe all possible side effects. Call your doctor for medical advice about side effects. You may report side effects to FDA at 1-800-FDA-1088. Where should I keep my medicine? Keep out of the reach of children. Store in a refrigerator between   2 and 8 degrees C (36 and 46 degrees F). Keep in carton to protect from light. Throw away this medicine if it is left out of the refrigerator for more than 5 consecutive days. Throw away any unused medicine after the expiration  date. NOTE: This sheet is a summary. It may not cover all possible information. If you have questions about this medicine, talk to your doctor, pharmacist, or health care provider.  2018 [employer]/Gold Standard (2015-03-20 19:07:04)  

## 2016-11-26 ENCOUNTER — Ambulatory Visit (HOSPITAL_BASED_OUTPATIENT_CLINIC_OR_DEPARTMENT_OTHER): Payer: Medicare Other

## 2016-11-26 VITALS — BP 129/75 | HR 99 | Temp 97.4°F | Resp 20

## 2016-11-26 DIAGNOSIS — Z5189 Encounter for other specified aftercare: Secondary | ICD-10-CM | POA: Diagnosis not present

## 2016-11-26 DIAGNOSIS — C50412 Malignant neoplasm of upper-outer quadrant of left female breast: Secondary | ICD-10-CM | POA: Diagnosis present

## 2016-11-26 DIAGNOSIS — C50919 Malignant neoplasm of unspecified site of unspecified female breast: Secondary | ICD-10-CM

## 2016-11-26 MED ORDER — TBO-FILGRASTIM 300 MCG/0.5ML ~~LOC~~ SOSY
300.0000 ug | PREFILLED_SYRINGE | Freq: Once | SUBCUTANEOUS | Status: AC
  Administered 2016-11-26: 300 ug via SUBCUTANEOUS
  Filled 2016-11-26: qty 0.5

## 2016-11-26 NOTE — Patient Instructions (Signed)
Tbo-Filgrastim injection What is this medicine? TBO-FILGRASTIM (T B O fil GRA stim) is a granulocyte colony-stimulating factor that stimulates the growth of neutrophils, a type of white blood cell important in the body's fight against infection. It is used to reduce the incidence of fever and infection in patients with certain types of cancer who are receiving chemotherapy that affects the bone marrow. This medicine may be used for other purposes; ask your health care provider or pharmacist if you have questions. COMMON BRAND NAME(S): Granix What should I tell my health care provider before I take this medicine? They need to know if you have any of these conditions: -bone scan or tests planned -kidney disease -sickle cell anemia -an unusual or allergic reaction to tbo-filgrastim, filgrastim, pegfilgrastim, other medicines, foods, dyes, or preservatives -pregnant or trying to get pregnant -breast-feeding How should I use this medicine? This medicine is for injection under the skin. If you get this medicine at home, you will be taught how to prepare and give this medicine. Refer to the Instructions for Use that come with your medication packaging. Use exactly as directed. Take your medicine at regular intervals. Do not take your medicine more often than directed. It is important that you put your used needles and syringes in a special sharps container. Do not put them in a trash can. If you do not have a sharps container, call your pharmacist or healthcare provider to get one. Talk to your pediatrician regarding the use of this medicine in children. Special care may be needed. Overdosage: If you think you have taken too much of this medicine contact a poison control center or emergency room at once. NOTE: This medicine is only for you. Do not share this medicine with others. What if I miss a dose? It is important not to miss your dose. Call your doctor or health care professional if you miss a  dose. What may interact with this medicine? This medicine may interact with the following medications: -medicines that may cause a release of neutrophils, such as lithium This list may not describe all possible interactions. Give your health care provider a list of all the medicines, herbs, non-prescription drugs, or dietary supplements you use. Also tell them if you smoke, drink alcohol, or use illegal drugs. Some items may interact with your medicine. What should I watch for while using this medicine? You may need blood work done while you are taking this medicine. What side effects may I notice from receiving this medicine? Side effects that you should report to your doctor or health care professional as soon as possible: -allergic reactions like skin rash, itching or hives, swelling of the face, lips, or tongue -blood in the urine -dark urine -dizziness -fast heartbeat -feeling faint -shortness of breath or breathing problems -signs and symptoms of infection like fever or chills; cough; or sore throat -signs and symptoms of kidney injury like trouble passing urine or change in the amount of urine -stomach or side pain, or pain at the shoulder -sweating -swelling of the legs, ankles, or abdomen -tiredness Side effects that usually do not require medical attention (report to your doctor or health care professional if they continue or are bothersome): -bone pain -headache -muscle pain -vomiting This list may not describe all possible side effects. Call your doctor for medical advice about side effects. You may report side effects to FDA at 1-800-FDA-1088. Where should I keep my medicine? Keep out of the reach of children. Store in a refrigerator between   2 and 8 degrees C (36 and 46 degrees F). Keep in carton to protect from light. Throw away this medicine if it is left out of the refrigerator for more than 5 consecutive days. Throw away any unused medicine after the expiration  date. NOTE: This sheet is a summary. It may not cover all possible information. If you have questions about this medicine, talk to your doctor, pharmacist, or health care provider.  2018 [employer]/Gold Standard (2015-03-20 19:07:04)  

## 2016-11-27 ENCOUNTER — Other Ambulatory Visit: Payer: Medicare Other

## 2016-11-27 ENCOUNTER — Ambulatory Visit: Payer: Medicare Other

## 2016-11-27 ENCOUNTER — Ambulatory Visit: Payer: Medicare Other | Admitting: Adult Health

## 2016-11-29 NOTE — Telephone Encounter (Signed)
No entry 

## 2016-12-04 ENCOUNTER — Ambulatory Visit (HOSPITAL_BASED_OUTPATIENT_CLINIC_OR_DEPARTMENT_OTHER): Payer: Medicare Other

## 2016-12-04 ENCOUNTER — Ambulatory Visit: Payer: Medicare Other

## 2016-12-04 ENCOUNTER — Encounter: Payer: Self-pay | Admitting: Adult Health

## 2016-12-04 ENCOUNTER — Ambulatory Visit (HOSPITAL_BASED_OUTPATIENT_CLINIC_OR_DEPARTMENT_OTHER): Payer: Medicare Other | Admitting: Adult Health

## 2016-12-04 ENCOUNTER — Other Ambulatory Visit (HOSPITAL_BASED_OUTPATIENT_CLINIC_OR_DEPARTMENT_OTHER): Payer: Medicare Other

## 2016-12-04 VITALS — BP 142/94 | HR 93 | Temp 97.5°F | Resp 20 | Wt 143.1 lb

## 2016-12-04 DIAGNOSIS — C50412 Malignant neoplasm of upper-outer quadrant of left female breast: Secondary | ICD-10-CM

## 2016-12-04 DIAGNOSIS — Z5112 Encounter for antineoplastic immunotherapy: Secondary | ICD-10-CM

## 2016-12-04 DIAGNOSIS — Z170 Estrogen receptor positive status [ER+]: Secondary | ICD-10-CM

## 2016-12-04 DIAGNOSIS — C50012 Malignant neoplasm of nipple and areola, left female breast: Secondary | ICD-10-CM

## 2016-12-04 LAB — CBC WITH DIFFERENTIAL/PLATELET
BASO%: 0.5 % (ref 0.0–2.0)
BASOS ABS: 0 10*3/uL (ref 0.0–0.1)
EOS%: 1.2 % (ref 0.0–7.0)
Eosinophils Absolute: 0.1 10*3/uL (ref 0.0–0.5)
HEMATOCRIT: 30.3 % — AB (ref 34.8–46.6)
HGB: 10.4 g/dL — ABNORMAL LOW (ref 11.6–15.9)
LYMPH#: 1.1 10*3/uL (ref 0.9–3.3)
LYMPH%: 24.9 % (ref 14.0–49.7)
MCH: 33.6 pg (ref 25.1–34.0)
MCHC: 34.2 g/dL (ref 31.5–36.0)
MCV: 98.4 fL (ref 79.5–101.0)
MONO#: 0.5 10*3/uL (ref 0.1–0.9)
MONO%: 11.4 % (ref 0.0–14.0)
NEUT#: 2.8 10*3/uL (ref 1.5–6.5)
NEUT%: 62 % (ref 38.4–76.8)
Platelets: 272 10*3/uL (ref 145–400)
RBC: 3.08 10*6/uL — ABNORMAL LOW (ref 3.70–5.45)
RDW: 21.2 % — AB (ref 11.2–14.5)
WBC: 4.6 10*3/uL (ref 3.9–10.3)

## 2016-12-04 LAB — COMPREHENSIVE METABOLIC PANEL
ALT: 23 U/L (ref 0–55)
AST: 28 U/L (ref 5–34)
Albumin: 4 g/dL (ref 3.5–5.0)
Alkaline Phosphatase: 136 U/L (ref 40–150)
Anion Gap: 11 mEq/L (ref 3–11)
BILIRUBIN TOTAL: 0.54 mg/dL (ref 0.20–1.20)
BUN: 12 mg/dL (ref 7.0–26.0)
CHLORIDE: 107 meq/L (ref 98–109)
CO2: 24 meq/L (ref 22–29)
CREATININE: 1.1 mg/dL (ref 0.6–1.1)
Calcium: 9.2 mg/dL (ref 8.4–10.4)
EGFR: 51 mL/min/{1.73_m2} — AB (ref >60)
GLUCOSE: 91 mg/dL (ref 70–140)
Potassium: 3.6 mEq/L (ref 3.5–5.1)
SODIUM: 141 meq/L (ref 136–145)
TOTAL PROTEIN: 7.4 g/dL (ref 6.4–8.3)

## 2016-12-04 MED ORDER — CARBOPLATIN CHEMO INJECTION 450 MG/45ML
137.6000 mg | Freq: Once | INTRAVENOUS | Status: AC
  Administered 2016-12-04: 140 mg via INTRAVENOUS
  Filled 2016-12-04: qty 14

## 2016-12-04 MED ORDER — SODIUM CHLORIDE 0.9 % IV SOLN
800.0000 mg/m2 | Freq: Once | INTRAVENOUS | Status: AC
  Administered 2016-12-04: 1368 mg via INTRAVENOUS
  Filled 2016-12-04: qty 35.98

## 2016-12-04 MED ORDER — HEPARIN SOD (PORK) LOCK FLUSH 100 UNIT/ML IV SOLN
500.0000 [IU] | Freq: Once | INTRAVENOUS | Status: AC | PRN
  Administered 2016-12-04: 500 [IU]
  Filled 2016-12-04: qty 5

## 2016-12-04 MED ORDER — SODIUM CHLORIDE 0.9 % IV SOLN
Freq: Once | INTRAVENOUS | Status: AC
  Administered 2016-12-04: 11:00:00 via INTRAVENOUS

## 2016-12-04 MED ORDER — PALONOSETRON HCL INJECTION 0.25 MG/5ML
INTRAVENOUS | Status: AC
  Filled 2016-12-04: qty 5

## 2016-12-04 MED ORDER — SODIUM CHLORIDE 0.9% FLUSH
10.0000 mL | INTRAVENOUS | Status: DC | PRN
  Administered 2016-12-04: 10 mL
  Filled 2016-12-04: qty 10

## 2016-12-04 MED ORDER — PALONOSETRON HCL INJECTION 0.25 MG/5ML
0.2500 mg | Freq: Once | INTRAVENOUS | Status: AC
  Administered 2016-12-04: 0.25 mg via INTRAVENOUS

## 2016-12-04 NOTE — Patient Instructions (Signed)
Implanted Port Home Guide An implanted port is a type of central line that is placed under the skin. Central lines are used to provide IV access when treatment or nutrition needs to be given through a person's veins. Implanted ports are used for long-term IV access. An implanted port may be placed because:  You need IV medicine that would be irritating to the small veins in your hands or arms.  You need long-term IV medicines, such as antibiotics.  You need IV nutrition for a long period.  You need frequent blood draws for lab tests.  You need dialysis.  Implanted ports are usually placed in the chest area, but they can also be placed in the upper arm, the abdomen, or the leg. An implanted port has two main parts:  Reservoir. The reservoir is round and will appear as a small, raised area under your skin. The reservoir is the part where a needle is inserted to give medicines or draw blood.  Catheter. The catheter is a thin, flexible tube that extends from the reservoir. The catheter is placed into a large vein. Medicine that is inserted into the reservoir goes into the catheter and then into the vein.  How will I care for my incision site? Do not get the incision site wet. Bathe or shower as directed by your health care provider. How is my port accessed? Special steps must be taken to access the port:  Before the port is accessed, a numbing cream can be placed on the skin. This helps numb the skin over the port site.  Your health care provider uses a sterile technique to access the port. ? Your health care provider must put on a mask and sterile gloves. ? The skin over your port is cleaned carefully with an antiseptic and allowed to dry. ? The port is gently pinched between sterile gloves, and a needle is inserted into the port.  Only "non-coring" port needles should be used to access the port. Once the port is accessed, a blood return should be checked. This helps ensure that the port  is in the vein and is not clogged.  If your port needs to remain accessed for a constant infusion, a clear (transparent) bandage will be placed over the needle site. The bandage and needle will need to be changed every week, or as directed by your health care provider.  Keep the bandage covering the needle clean and dry. Do not get it wet. Follow your health care provider's instructions on how to take a shower or bath while the port is accessed.  If your port does not need to stay accessed, no bandage is needed over the port.  What is flushing? Flushing helps keep the port from getting clogged. Follow your health care provider's instructions on how and when to flush the port. Ports are usually flushed with saline solution or a medicine called heparin. The need for flushing will depend on how the port is used.  If the port is used for intermittent medicines or blood draws, the port will need to be flushed: ? After medicines have been given. ? After blood has been drawn. ? As part of routine maintenance.  If a constant infusion is running, the port may not need to be flushed.  How long will my port stay implanted? The port can stay in for as long as your health care provider thinks it is needed. When it is time for the port to come out, surgery will be   done to remove it. The procedure is similar to the one performed when the port was put in. When should I seek immediate medical care? When you have an implanted port, you should seek immediate medical care if:  You notice a bad smell coming from the incision site.  You have swelling, redness, or drainage at the incision site.  You have more swelling or pain at the port site or the surrounding area.  You have a fever that is not controlled with medicine.  This information is not intended to replace advice given to you by your health care provider. Make sure you discuss any questions you have with your health care provider. Document  Released: 01/28/2005 Document Revised: 07/06/2015 Document Reviewed: 10/05/2012 Elsevier Interactive Patient Education  2017 Elsevier Inc.  

## 2016-12-04 NOTE — Progress Notes (Signed)
. [name]   DOB: [birth year]  MR#: [id number]  CSN#:[id number]  PCP: [name], MD Patient Care Team: [name], MD as PCP - General Magrinat, [name], MD as Consulting Physician (Oncology) [name], MD as Consulting Physician (General Surgery) [name], MD as Consulting Physician (Gastroenterology)   CHIEF COMPLAINT: Weakly estrogen receptor positive breast cancer  CURRENT TREATMENT: Adjuvant chemotherapy   INTERVAL HISTORY: Jaice returns today for follow-up and treatment of her weakly estrogen receptor positive left sided breast cancer. Today is day 1 cycle  of 4 planned cycles of carboplatin and gemcitabine which she receives on days 1 and 8 of each 21 day cycle with Neupogen on days 2 and 3, and Neulasta in the form of Onpro on day 8 of every cycle.    REVIEW OF SYSTEMS: Chandrea is doing well today. She feels an area of swelling near her lumpectomy site and wants me to look at it.  Otherwise a detailed ROS is non contributory.   BREAST CANCER HISTORY: From the recent summary note:  I last saw Tianni in 2013 when she was released from follow-up from her right breast cancer recurrence in 2004. She is status post right mastectomy and adjuvant chemotherapy for that triple negative tumor. More recently, on 07/16/2016 she underwent left screening mammography at the [facility] on 07/16/2016. This found the breast density to be category B. There was a possible asymmetry in the left breast, and on 07/19/2016 she underwent left diagnostic mammography with tomography and left breast ultrasonography. This confirmed a well circumscribed oval mass at the 9:00 position of the left breast measuring approximately 0.8 cm. This was not palpable. On ultrasonography the mass measured 0.6 cm and it was located at the 9:00 radiant 4 cm from the nipple. The left axilla was sonographically benign.  On June 11 Hitomi underwent biopsy of the left breast mass in question  and this showed (SAA [phone]) and invasive ductal carcinoma, grade 3, estrogen receptor 5% positive with weak staining intensity, progesterone receptor negative, with an MIB-1 of 80%, and HER-2 nonamplified, the signals ratio being 1.46 and the number per cell 1.90.  Her subsequent history is as detailed below.   PAST MEDICAL HISTORY: Past Medical History:  Diagnosis Date  . Allergic rhinitis   . Anxiety   . Breast cancer ([place]) hx 2004   recurrent 2006 DR Magrinat  . HTN (hypertension)   . Hyperlipidemia   . Hypothyroidism   . Personal history of chemotherapy 2002  . Personal history of radiation therapy 2002  . Psoriasis   . S/P thyroidectomy 07/2005   2 cm largest diameter (1 other tiny focus)/ i-131 rx 99 mci 08/2005  . Thyroid cancer ([place])    Papillary Stage 1 - Dr Loanne Drilling  . Vitamin B12 deficiency 2009  . Vitamin D deficiency 2009    PAST SURGICAL HISTORY: Past Surgical History:  Procedure Laterality Date  . BREAST LUMPECTOMY    . BREAST LUMPECTOMY WITH RADIOACTIVE SEED AND SENTINEL LYMPH NODE BIOPSY Left 09/11/2016   Procedure: LEFT BREAST LUMPECTOMY WITH RADIOACTIVE SEED AND LEFT AXILLARY SENTINEL LYMPH NODE BIOPSY WITH BLUE DYE INJECTION;  Surgeon: [name], MD;  Location: [facility];  Service: General;  Laterality: Left;  . IR FLUORO GUIDE PORT INSERTION RIGHT  09/13/2016  . IR US GUIDE VASC ACCESS RIGHT  09/13/2016  . MASTECTOMY     Right  . PORTACATH PLACEMENT N/A 09/11/2016   Procedure: ATTEMPTED INSERTION PORT-A-CATH WITH  ULTRA SOUND GUIDANCE;  Surgeon: [name], MD;  Location: [facility];  Service: General;  Laterality: N/A;  . REDUCTION MAMMAPLASTY Left 2005  . THYROIDECTOMY  2007    FAMILY HISTORY Family History  Problem Relation Age of Onset  . Stroke Mother   . Allergies Mother   . Asthma Mother   . Clotting disorder Mother   . Heart disease Father 88       MI  . Allergies Sister   . Asthma Sister   . Cancer  Sister        Breast  . Asthma Brother   . Allergies Daughter   . Asthma Daughter   . Allergies Sister   Family history includes a sister, a paternal aunt, and a paternal cousin with breast cancer all older then 68 at diagnosis  GYN HISTORY: Menarche age 79, first live birth age 81. She is GX P3. She went through the change of life age 40. She did not take hormone replacement.  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 Her husband [name] is retired from [employer]. Her children are [name], who works for time [employer] in [place], and La Carla, who lives in [place] and works for [employer]. The third child, [name], died in an automobile accident at age 59. The patient has one grandchild. 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 Smoking status: Never Smoker  . Smokeless tobacco: Never Used  . Alcohol use No     Colonoscopy:  PAP:  Bone density:  Lipid panel:  Allergies  Allergen Reactions  . Pneumococcal Vaccines Other (See Comments)    Was really sick   . Sulfa Antibiotics Other (See Comments)    Pt believes it was hives  . Sulfacetamide Sodium-Sulfur   . Tape Itching, Dermatitis and Rash  . Tegaderm Ag Mesh [Silver] Itching and Rash    Current Outpatient Prescriptions  Medication Sig Dispense Refill  . ALPRAZolam (XANAX) 0.25 MG tablet Take 0.125-0.25 mg by mouth 2 (two) times daily as needed for anxiety or sleep (Pt takes a half of a tablet every evening).     [name] amoxicillin (AMOXIL) 500 MG tablet Take 500 mg by mouth 1 day or 1 dose.    . cholecalciferol (VITAMIN D) 1000 units tablet Take 1,000 Units by mouth daily.    [name] dexamethasone (DECADRON) 4 MG tablet Take 1 tablet twice a day with food AS NEEDED for nausea 30 tablet 1  . DULoxetine (CYMBALTA) 60 MG capsule Take 60 mg by mouth every morning.     [name] levothyroxine (SYNTHROID, LEVOTHROID) 112  MCG tablet TAKE 1 TABLET (112 MCG TOTAL) BY MOUTH DAILY. 90 tablet 3  . lidocaine-prilocaine (EMLA) cream Apply to affected area once 30 g 3  . losartan (COZAAR) 100 MG tablet TAKE 1 TABLET BY MOUTH EVERY DAY FOR BLOOD PRESSURE 90 tablet 1  . prochlorperazine (COMPAZINE) 10 MG tablet Take before meals and at bedtime day after chemo, then as needed for nausea 30 tablet 1  . rosuvastatin (CRESTOR) 20 MG tablet Take 1 tablet (20 mg total) by mouth daily. 90 tablet 2  . Syringe/Needle, Disp, (B-D ECLIPSE SYRINGE) 30G X 1/2" 1 ML MISC 1 each by Does not apply route 1 day or 1 dose. For Vit B12 inj 50 each 11  . vitamin B-12 (CYANOCOBALAMIN) 1000 MCG tablet Take 1,000 mcg  by mouth every other day.     No current facility-administered medications for this visit.    Facility-Administered Medications Ordered in Other Visits  Medication Dose Route Frequency Provider Last Rate Last Dose  . CARBOplatin (PARAPLATIN) 140 mg in sodium chloride 0.9 % 250 mL chemo infusion  140 mg Intravenous Once Magrinat, [name], MD 528 mL/hr at 12/04/16 1314 140 mg at 12/04/16 1314  . heparin lock flush 100 unit/mL  500 Units Intracatheter Once PRN Magrinat, [name], MD      . sodium chloride flush (NS) 0.9 % injection 10 mL  10 mL Intracatheter PRN Magrinat, [name], MD        OBJECTIVE:   Vitals:   12/04/16 1057  BP: (!) 142/94  Pulse: 93  Resp: 20  Temp: (!) 97.5 F (36.4 C)  SpO2: 100%     Body mass index is 24.57 kg/m.    ECOG FS: 1  GENERAL: Patient is a well appearing female in no acute distress HEENT:  Sclerae anicteric. PERRL  Oropharynx clear and moist. No ulcerations or evidence of oropharyngeal candidiasis. Neck is supple. There is no swelling noted along her left lower mandible or tenderness noted to palpation NODES:  No cervical, supraclavicular, or axillary lymphadenopathy palpated.  BREAST EXAM:  Thickness at left lumpectomy site, likely scar tissue. LUNGS:  Clear to auscultation bilaterally.   No wheezes or rhonchi. HEART:  Regular rate and rhythm. No murmur appreciated. ABDOMEN:  Soft, nontender.  Positive, normoactive bowel sounds. No organomegaly palpated. MSK:  No focal spinal tenderness to palpation. Full range of motion bilaterally in the upper extremities. EXTREMITIES:  No peripheral edema.   SKIN:  Clear with no obvious rashes or skin changes. No nail dyscrasia. NEURO:  Nonfocal. Well oriented.  Appropriate affect.      LAB RESULTS: Lab Results  Component Value Date   WBC 4.6 12/04/2016   NEUTROABS 2.8 12/04/2016   HGB 10.4 (L) 12/04/2016   HCT 30.3 (L) 12/04/2016   MCV 98.4 12/04/2016   PLT 272 12/04/2016      Chemistry      Component Value Date/Time   NA 141 12/04/2016 0941   K 3.6 12/04/2016 0941   CL 108 09/13/2016 0728   CL 104 10/09/2011 1403   CO2 24 12/04/2016 0941   BUN 12.0 12/04/2016 0941   CREATININE 1.1 12/04/2016 0941      Component Value Date/Time   CALCIUM 9.2 12/04/2016 0941   ALKPHOS 136 12/04/2016 0941   AST 28 12/04/2016 0941   ALT 23 12/04/2016 0941   BILITOT 0.54 12/04/2016 0941       Lab Results  Component Value Date   LABCA2 16 10/09/2011    No components found for: OVANV916  No results for input(s): INR in the last 168 hours.  Urinalysis    Component Value Date/Time   COLORURINE YELLOW 06/05/2015 1559   APPEARANCEUR CLEAR 06/05/2015 1559   LABSPEC 1.020 06/05/2015 1559   PHURINE 5.5 06/05/2015 1559   GLUCOSEU NEGATIVE 06/05/2015 1559   HGBUR NEGATIVE 06/05/2015 1559   BILIRUBINUR NEGATIVE 06/05/2015 1559   KETONESUR NEGATIVE 06/05/2015 1559   UROBILINOGEN 0.2 06/05/2015 1559   NITRITE NEGATIVE 06/05/2015 1559   LEUKOCYTESUR MODERATE (A) 06/05/2015 1559    STUDIES: No results found.    ASSESSMENT: 76 y.o. [place] woman   (1) status post right lumpectomy and sentinel lymph node biopsy in September 2002 for multifocal breast carcinoma, triple negative.  Treated with CMF followed by radiation.    (  2) Local recurrence in April 2004, status post right modified radical mastectomy with TRAM reconstruction for what proved to be a T1c N0, stage IA triple negative breast carcinoma.  Treated adjuvantly with paclitaxel and doxorubicin x4 in 2004.  Off treatment since August 2004 with no evidence of recurrence  (3) history of papillary thyroid cancer s/p thyroidectomy June 2007, s/p radioactive iodine July 2007  (4) status post left breast upper outer quadrant biopsy 08/21/2016 for a clinical T1b N0, stage IB invasive ductal carcinoma, grade 3, weakly estrogen receptor positive, progesterone receptor and HER-2 negative, with an MIB-1 of 80%.  (5) left lumpectomy and sentinel lymph node sampling 09/11/2016 found a pT1b pN0, stage IB invasive ductal carcinoma, grade 3, with negative margins.  (6) adjuvant chemotherapy consisting of carboplatin and gemcitabine given days 1 and 8 of each 21 day cycle 4 cycles, started 09/18/2016 (Neupogen on days 2 and 3, and Onpro on day 8 for chemotherapy induced neutropenia)  (7) adjuvant radiation to follow  (8) genetics testing pending.  PLAN:  Jasiya is doing well today.  I reviewed that we can get a diagnostic mammogram and ultrasound of her left breast prior to radiation to evaluate this thickness.  However, she wants to see her surgeon first.  She was previously a Dr. Dalbert Batman patient, however, would like to switch to Dr. Barry Dienes.  I will see if Dr. Barry Dienes can get her in.  She will return in one week for labs, follow up with Dr. Jana Hakim, and her final chemotherapy.    Maimouna verbalized understanding of the above.  She knows to call prior to her next appointment with [place] if she has any questions or concerns.   A total of (20) minutes of face-to-face time was spent with this patient with greater than 50% of that time in counseling and care-coordination.   Scot Dock    12/04/2016

## 2016-12-05 ENCOUNTER — Encounter: Payer: Self-pay | Admitting: Genetic Counselor

## 2016-12-05 ENCOUNTER — Ambulatory Visit (HOSPITAL_BASED_OUTPATIENT_CLINIC_OR_DEPARTMENT_OTHER): Payer: Medicare Other | Admitting: Genetic Counselor

## 2016-12-05 ENCOUNTER — Ambulatory Visit: Payer: Medicare Other

## 2016-12-05 ENCOUNTER — Ambulatory Visit (HOSPITAL_BASED_OUTPATIENT_CLINIC_OR_DEPARTMENT_OTHER): Payer: Medicare Other

## 2016-12-05 ENCOUNTER — Other Ambulatory Visit: Payer: Medicare Other

## 2016-12-05 VITALS — BP 142/83 | HR 100 | Temp 98.0°F | Resp 18

## 2016-12-05 DIAGNOSIS — Z85820 Personal history of malignant melanoma of skin: Secondary | ICD-10-CM | POA: Diagnosis not present

## 2016-12-05 DIAGNOSIS — Z809 Family history of malignant neoplasm, unspecified: Secondary | ICD-10-CM | POA: Diagnosis not present

## 2016-12-05 DIAGNOSIS — Z807 Family history of other malignant neoplasms of lymphoid, hematopoietic and related tissues: Secondary | ICD-10-CM | POA: Diagnosis not present

## 2016-12-05 DIAGNOSIS — Z803 Family history of malignant neoplasm of breast: Secondary | ICD-10-CM | POA: Insufficient documentation

## 2016-12-05 DIAGNOSIS — Z800 Family history of malignant neoplasm of digestive organs: Secondary | ICD-10-CM

## 2016-12-05 DIAGNOSIS — Z5189 Encounter for other specified aftercare: Secondary | ICD-10-CM

## 2016-12-05 DIAGNOSIS — C50412 Malignant neoplasm of upper-outer quadrant of left female breast: Secondary | ICD-10-CM

## 2016-12-05 DIAGNOSIS — Z8051 Family history of malignant neoplasm of kidney: Secondary | ICD-10-CM | POA: Diagnosis not present

## 2016-12-05 DIAGNOSIS — Z315 Encounter for genetic counseling: Secondary | ICD-10-CM

## 2016-12-05 DIAGNOSIS — Z853 Personal history of malignant neoplasm of breast: Secondary | ICD-10-CM | POA: Diagnosis not present

## 2016-12-05 DIAGNOSIS — Z170 Estrogen receptor positive status [ER+]: Principal | ICD-10-CM

## 2016-12-05 DIAGNOSIS — Z1379 Encounter for other screening for genetic and chromosomal anomalies: Secondary | ICD-10-CM

## 2016-12-05 HISTORY — DX: Encounter for other screening for genetic and chromosomal anomalies: Z13.79

## 2016-12-05 MED ORDER — TBO-FILGRASTIM 300 MCG/0.5ML ~~LOC~~ SOSY
300.0000 ug | PREFILLED_SYRINGE | Freq: Once | SUBCUTANEOUS | Status: AC
  Administered 2016-12-05: 300 ug via SUBCUTANEOUS
  Filled 2016-12-05: qty 0.5

## 2016-12-05 NOTE — Patient Instructions (Signed)
Tbo-Filgrastim injection What is this medicine? TBO-FILGRASTIM (T B O fil GRA stim) is a granulocyte colony-stimulating factor that stimulates the growth of neutrophils, a type of white blood cell important in the body's fight against infection. It is used to reduce the incidence of fever and infection in patients with certain types of cancer who are receiving chemotherapy that affects the bone marrow. This medicine may be used for other purposes; ask your health care provider or pharmacist if you have questions. COMMON BRAND NAME(S): Granix What should I tell my health care provider before I take this medicine? They need to know if you have any of these conditions: -bone scan or tests planned -kidney disease -sickle cell anemia -an unusual or allergic reaction to tbo-filgrastim, filgrastim, pegfilgrastim, other medicines, foods, dyes, or preservatives -pregnant or trying to get pregnant -breast-feeding How should I use this medicine? This medicine is for injection under the skin. If you get this medicine at home, you will be taught how to prepare and give this medicine. Refer to the Instructions for Use that come with your medication packaging. Use exactly as directed. Take your medicine at regular intervals. Do not take your medicine more often than directed. It is important that you put your used needles and syringes in a special sharps container. Do not put them in a trash can. If you do not have a sharps container, call your pharmacist or healthcare provider to get one. Talk to your pediatrician regarding the use of this medicine in children. Special care may be needed. Overdosage: If you think you have taken too much of this medicine contact a poison control center or emergency room at once. NOTE: This medicine is only for you. Do not share this medicine with others. What if I miss a dose? It is important not to miss your dose. Call your doctor or health care professional if you miss a  dose. What may interact with this medicine? This medicine may interact with the following medications: -medicines that may cause a release of neutrophils, such as lithium This list may not describe all possible interactions. Give your health care provider a list of all the medicines, herbs, non-prescription drugs, or dietary supplements you use. Also tell them if you smoke, drink alcohol, or use illegal drugs. Some items may interact with your medicine. What should I watch for while using this medicine? You may need blood work done while you are taking this medicine. What side effects may I notice from receiving this medicine? Side effects that you should report to your doctor or health care professional as soon as possible: -allergic reactions like skin rash, itching or hives, swelling of the face, lips, or tongue -blood in the urine -dark urine -dizziness -fast heartbeat -feeling faint -shortness of breath or breathing problems -signs and symptoms of infection like fever or chills; cough; or sore throat -signs and symptoms of kidney injury like trouble passing urine or change in the amount of urine -stomach or side pain, or pain at the shoulder -sweating -swelling of the legs, ankles, or abdomen -tiredness Side effects that usually do not require medical attention (report to your doctor or health care professional if they continue or are bothersome): -bone pain -headache -muscle pain -vomiting This list may not describe all possible side effects. Call your doctor for medical advice about side effects. You may report side effects to FDA at 1-800-FDA-1088. Where should I keep my medicine? Keep out of the reach of children. Store in a refrigerator between   2 and 8 degrees C (36 and 46 degrees F). Keep in carton to protect from light. Throw away this medicine if it is left out of the refrigerator for more than 5 consecutive days. Throw away any unused medicine after the expiration  date. NOTE: This sheet is a summary. It may not cover all possible information. If you have questions about this medicine, talk to your doctor, pharmacist, or health care provider.  2018 [employer]/Gold Standard (2015-03-20 19:07:04)  

## 2016-12-05 NOTE — Patient Instructions (Signed)
Implanted Port Home Guide An implanted port is a type of central line that is placed under the skin. Central lines are used to provide IV access when treatment or nutrition needs to be given through a person's veins. Implanted ports are used for long-term IV access. An implanted port may be placed because:  You need IV medicine that would be irritating to the small veins in your hands or arms.  You need long-term IV medicines, such as antibiotics.  You need IV nutrition for a long period.  You need frequent blood draws for lab tests.  You need dialysis.  Implanted ports are usually placed in the chest area, but they can also be placed in the upper arm, the abdomen, or the leg. An implanted port has two main parts:  Reservoir. The reservoir is round and will appear as a small, raised area under your skin. The reservoir is the part where a needle is inserted to give medicines or draw blood.  Catheter. The catheter is a thin, flexible tube that extends from the reservoir. The catheter is placed into a large vein. Medicine that is inserted into the reservoir goes into the catheter and then into the vein.  How will I care for my incision site? Do not get the incision site wet. Bathe or shower as directed by your health care provider. How is my port accessed? Special steps must be taken to access the port:  Before the port is accessed, a numbing cream can be placed on the skin. This helps numb the skin over the port site.  Your health care provider uses a sterile technique to access the port. ? Your health care provider must put on a mask and sterile gloves. ? The skin over your port is cleaned carefully with an antiseptic and allowed to dry. ? The port is gently pinched between sterile gloves, and a needle is inserted into the port.  Only "non-coring" port needles should be used to access the port. Once the port is accessed, a blood return should be checked. This helps ensure that the port  is in the vein and is not clogged.  If your port needs to remain accessed for a constant infusion, a clear (transparent) bandage will be placed over the needle site. The bandage and needle will need to be changed every week, or as directed by your health care provider.  Keep the bandage covering the needle clean and dry. Do not get it wet. Follow your health care provider's instructions on how to take a shower or bath while the port is accessed.  If your port does not need to stay accessed, no bandage is needed over the port.  What is flushing? Flushing helps keep the port from getting clogged. Follow your health care provider's instructions on how and when to flush the port. Ports are usually flushed with saline solution or a medicine called heparin. The need for flushing will depend on how the port is used.  If the port is used for intermittent medicines or blood draws, the port will need to be flushed: ? After medicines have been given. ? After blood has been drawn. ? As part of routine maintenance.  If a constant infusion is running, the port may not need to be flushed.  How long will my port stay implanted? The port can stay in for as long as your health care provider thinks it is needed. When it is time for the port to come out, surgery will be   done to remove it. The procedure is similar to the one performed when the port was put in. When should I seek immediate medical care? When you have an implanted port, you should seek immediate medical care if:  You notice a bad smell coming from the incision site.  You have swelling, redness, or drainage at the incision site.  You have more swelling or pain at the port site or the surrounding area.  You have a fever that is not controlled with medicine.  This information is not intended to replace advice given to you by your health care provider. Make sure you discuss any questions you have with your health care provider. Document  Released: 01/28/2005 Document Revised: 07/06/2015 Document Reviewed: 10/05/2012 Elsevier Interactive Patient Education  2017 Elsevier Inc.  

## 2016-12-05 NOTE — Progress Notes (Signed)
[facility]      Initial Visit   Patient Name: [name] [name] Patient DOB: 1940-05-16 Patient Age: 76 y.o. Encounter Date: 12/05/2016  Referring Provider: Lurline Del, MD  Primary Care Provider: [name], MD  Reason for Visit: Evaluate for hereditary susceptibility to cancer    Assessment and Plan:  . [name] [name]'s personal history of bilateral breast cancers (one of which was triple negative) is somewhat concerning for a hereditary predisposition to cancer. However, her sisters were diagnosed in their 52s and she has numerous unaffected female relatives on both her maternal and paternal sides of her family. She meets NCCN criteria for genetic testing.  . Testing is recommended to determine whether she has a pathogenic mutation that will impact her screening and risk-reduction for cancer. A negative result will be generally reassuring.  . [name] wished to pursue genetic testing and a blood sample will be sent for analysis of the 83 genes on [facility]'s Multi-Cancer panel (ALK, APC, ATM, AXIN2, BAP1, BARD1, BLM, BMPR1A, BRCA1, BRCA2, BRIP1, CASR, CDC73, CDH1, CDK4, CDKN1B, CDKN1C, CDKN2A, CEBPA, CHEK2, CTNNA1, DICER1, DIS3L2, EGFR, EPCAM, FH, FLCN, GATA2, GPC3, GREM1, HOXB13, HRAS, KIT, MAX, MEN1, MET, MITF, MLH1, MSH2, MSH3, MSH6, MUTYH, NBN, NF1, NF2, NTHL1, PALB2, PDGFRA, PHOX2B, PMS2, POLD1, POLE, POT1, PRKAR1A, PTCH1, PTEN, RAD50, RAD51C, RAD51D, RB1, RECQL4, RET, RUNX1, SDHA, SDHAF2, SDHB, SDHC, SDHD, SMAD4, SMARCA4, SMARCB1, SMARCE1, STK11, SUFU, TERC, TERT, TMEM127, TP53, TSC1, TSC2, VHL, WRN, WT1).   . Results should be available in approximately 2-4 weeks, at which point we will contact her and address implications for her as well as address genetic testing for at-risk family members, if needed.     Dr. Jana Hakim was available for questions concerning this case. Total time spent by me in face-to-face counseling was approximately 30  minutes.   _____________________________________________________________________   History of Present Illness: [name] [name], a 76 y.o. female, is being seen at the [facility] due to a personal and family history of cancer. She presents to clinic today to discuss the possibility of a hereditary predisposition to cancer and discuss whether genetic testing is warranted.  [name] [name] was recently diagnosed with left breast cancer at the age of 15.  She has a history of right breast cancer at age 48 that was ER/PR/HER2 negative. She also has a history of papillary thyroid cancer at age 41.  Past Medical History:  Diagnosis Date  . Allergic rhinitis   . Anxiety   . Breast cancer ([place]) hx 2004   recurrent 2006 DR Magrinat  . Family history of breast cancer   . HTN (hypertension)   . Hyperlipidemia   . Hypothyroidism   . Personal history of chemotherapy 2002  . Personal history of radiation therapy 2002  . Psoriasis   . S/P thyroidectomy 07/2005   2 cm largest diameter (1 other tiny focus)/ i-131 rx 99 mci 08/2005  . Thyroid cancer ([place])    Papillary Stage 1 - Dr Loanne Drilling  . Vitamin B12 deficiency 2009  . Vitamin D deficiency 2009    Past Surgical History:  Procedure Laterality Date  . BREAST LUMPECTOMY    . BREAST LUMPECTOMY WITH RADIOACTIVE SEED AND SENTINEL LYMPH NODE BIOPSY Left 09/11/2016   Procedure: LEFT BREAST LUMPECTOMY WITH RADIOACTIVE SEED AND LEFT AXILLARY SENTINEL LYMPH NODE BIOPSY WITH BLUE DYE INJECTION;  Surgeon: [name], MD;  Location: [facility];  Service: General;  Laterality: Left;  . IR FLUORO GUIDE PORT INSERTION RIGHT  09/13/2016  . IR US GUIDE VASC ACCESS RIGHT  09/13/2016  . MASTECTOMY     Right  . PORTACATH PLACEMENT N/A 09/11/2016   Procedure: ATTEMPTED INSERTION PORT-A-CATH WITH ULTRA SOUND GUIDANCE;  Surgeon: [name], MD;  Location: [facility];  Service: General;  Laterality: N/A;  .  REDUCTION MAMMAPLASTY Left 2005  . THYROIDECTOMY  2007    Social History   Social History  . Marital status: Married    Spouse name: N/A  . Number of children: 2  . Years of education: N/A   Occupational History  . 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 Smoking status: Never Smoker  . Smokeless tobacco: Never Used  . Alcohol use No  . Drug use: No  . Sexual activity: Not Currently   Other Topics Concern  . Not on file   Social History Narrative   GI - Dr Earlean Shawl   Depression - Dr Toy Care   GYN - Dr Marianna Payment           Family History:  During the visit, a 4-generation pedigree was obtained. Family tree will be scanned in the Media tab in Epic  Significant diagnoses include the following:  Family History  Problem Relation Age of Onset  . Stroke Mother   . Allergies Mother   . Asthma Mother   . Clotting disorder Mother   . Heart disease Father 70       MI  . Allergies Sister   . Asthma Sister   . Breast cancer Sister 26       Lymphoma 4; currently 21  . Asthma Brother   . Leukemia Brother        dx 83s  . Allergies Daughter   . Asthma Daughter   . Allergies Sister   . Breast cancer Sister 24       currently 11  . Cancer Paternal Aunt        kidney ca; deceased 79  . Cancer Paternal Uncle        unk. primary ("liver")  . Throat cancer Maternal Grandmother        deceased 31  . Lung cancer Maternal Grandfather        deceased 85  . Pancreatic cancer Paternal Grandfather        deceased 53  . Cancer Paternal Aunt        "abdominal"; deceased 92    Additionally, [name] [name] has a son (age 35) and a daughter (age 2). Other than her sisters noted above, she has a brother (age 1). Her mother died at 14, cancer-free, but had a TAH/BSO in her 15s. Her mother had a total of 6 brothers and 3 sisters. Her father died at 64, cancer-free. He had a total of 3 brothers and 5 sisters.  Ms. Luevanos ancestry is  Caucasian - NOS. There is no known Jewish ancestry and no consanguinity.  Discussion: We reviewed the characteristics, features and inheritance patterns of hereditary cancer syndromes. We discussed her risk of harboring a mutation in the context of her personal and family history. We discussed the process of genetic testing, insurance coverage and implications of results: positive, negative and variant of unknown significance (VUS).    Ms. Beauchesne questions were answered to her satisfaction today and she is welcome to call with any additional questions or concerns. Thank you  for the referral and allowing [place] to share in the care of your patient.    Steele Berg, MS, [place] Certified Genetic Counselor phone: [phone] [email or web address]   ______________________________________________________________________ For Office Staff:  Number of people involved in session: 1 Was an Intern/ student involved with case: no

## 2016-12-06 ENCOUNTER — Ambulatory Visit: Payer: Medicare Other

## 2016-12-06 ENCOUNTER — Telehealth: Payer: Self-pay | Admitting: *Deleted

## 2016-12-06 ENCOUNTER — Ambulatory Visit (HOSPITAL_BASED_OUTPATIENT_CLINIC_OR_DEPARTMENT_OTHER): Payer: Medicare Other

## 2016-12-06 VITALS — BP 114/71 | HR 98 | Temp 98.0°F | Resp 18

## 2016-12-06 DIAGNOSIS — Z5189 Encounter for other specified aftercare: Secondary | ICD-10-CM | POA: Diagnosis not present

## 2016-12-06 DIAGNOSIS — D702 Other drug-induced agranulocytosis: Secondary | ICD-10-CM

## 2016-12-06 DIAGNOSIS — Z170 Estrogen receptor positive status [ER+]: Principal | ICD-10-CM

## 2016-12-06 DIAGNOSIS — C50412 Malignant neoplasm of upper-outer quadrant of left female breast: Secondary | ICD-10-CM | POA: Diagnosis present

## 2016-12-06 MED ORDER — TBO-FILGRASTIM 300 MCG/0.5ML ~~LOC~~ SOSY
300.0000 ug | PREFILLED_SYRINGE | Freq: Once | SUBCUTANEOUS | Status: AC
  Administered 2016-12-06: 300 ug via SUBCUTANEOUS
  Filled 2016-12-06: qty 0.5

## 2016-12-06 MED ORDER — AZITHROMYCIN 250 MG PO TABS: ORAL_TABLET | ORAL | 0 refills | Status: DC

## 2016-12-06 NOTE — Telephone Encounter (Signed)
"  I have an appointment today at 1:45 I need to reschedulefor tomorow or Monday.   It's for an injection.  (Granix 300 mg)  My nose is running,sinuses hurt, sore throat, ear ache, chills last night, no fever, Temp = 97.9 today.  My husband got me something like claritin for seasonal allergies."    This nurse offered to notify providers of symptoms, reviewed Granix use.  "Well can I have today's latest appointment available.  This nurse transferred call to scheduler requesting last injection appointment for today for this patient.  Routing call information to collaborative nurse and providers for review.  Further patient communication through collaborative nurse.

## 2016-12-06 NOTE — Telephone Encounter (Signed)
This RN contacted pt upon receiving message due to no noted appointment for granix inj for today.  Discussed with pt current status including issues of congestion, cough and chills.  She has NOT developed any fevers.  Above discussed including concern that she received chemo and needs injection to support her immune system- regardless of how she feels.  Analyce stated she will come in at 4 pm for injection.  This RN requested due to above symptoms to come over to the MD office side and this RN will administer granix.  Above reviewed with MD - who may order an abx upon pt's arrival and RN assessment.

## 2016-12-06 NOTE — Telephone Encounter (Signed)
Will you make sure this gets taken care of?

## 2016-12-07 ENCOUNTER — Ambulatory Visit: Payer: Medicare Other

## 2016-12-08 NOTE — Progress Notes (Signed)
. [name]   DOB: Aug 21, 1940  MR#: [id number]  CSN#:[id number]  PCP: [name], MD Patient Care Team: [name], MD as PCP - General [name], [name], MD as Consulting Physician (Oncology) [name], MD as Consulting Physician (General Surgery) [name], MD as Consulting Physician (Gastroenterology)   CHIEF COMPLAINT: Weakly estrogen receptor positive breast cancer  CURRENT TREATMENT: Adjuvant chemotherapy   INTERVAL HISTORY: [name] [name] returns today for follow-up and treatment of her weekly estrogen receptor positive breast cancer.  Today is day 8 cycle 4 of 4 planned cycles of carboplatin and gemcitabine, which she receives on days 1 and 8 of each 21-day cycle.  She received Neupogen on days 2 and 3 of each cycle and Neulasta on day 9.  REVIEW OF SYSTEMS: Aside from cytopenias, which required growth factor support, she has done remarkably well with the chemotherapy.  She did not lose any significant amount of hair, she has had no problems with nausea or vomiting, and has maintained a good functional status.  She is concerned that there may be a mass in the surgical breast that she thinks needs to be evaluated further.  A detailed review of systems today was otherwise negative.  BREAST CANCER HISTORY: From the recent summary note:  I last saw [name] [name] in 2013 when she was released from follow-up from her right breast cancer recurrence in 2004. She is status post right mastectomy and adjuvant chemotherapy for that triple negative tumor. More recently, on 07/16/2016 she underwent left screening mammography at the [facility] on 07/16/2016. This found the breast density to be category B. There was a possible asymmetry in the left breast, and on 07/19/2016 she underwent left diagnostic mammography with tomography and left breast ultrasonography. This confirmed a well circumscribed oval mass at the 9:00 position of the left breast measuring approximately 0.8  cm. This was not palpable. On ultrasonography the mass measured 0.6 cm and it was located at the 9:00 radiant 4 cm from the nipple. The left axilla was sonographically benign.  On June 11 [name] underwent biopsy of the left breast mass in question and this showed ([id number]) and invasive ductal carcinoma, grade 3, estrogen receptor 5% positive with weak staining intensity, progesterone receptor negative, with an MIB-1 of 80%, and HER-2 nonamplified, the signals ratio being 1.46 and the number per cell 1.90.  Her subsequent history is as detailed below.   PAST MEDICAL HISTORY: Past Medical History:  Diagnosis Date  . Allergic rhinitis   . Anxiety   . Breast cancer ([place]) hx 2004   recurrent 2006 DR Cuauhtemoc Huegel  . Family history of breast cancer   . HTN (hypertension)   . Hyperlipidemia   . Hypothyroidism   . Personal history of chemotherapy 2002  . Personal history of radiation therapy 2002  . Psoriasis   . S/P thyroidectomy 07/2005   2 cm largest diameter (1 other tiny focus)/ i-131 rx 99 [facility] 08/2005  . Thyroid cancer ([place])    Papillary Stage 1 - Dr Loanne Drilling  . Vitamin B12 deficiency 2009  . Vitamin D deficiency 2009    PAST SURGICAL HISTORY: Past Surgical History:  Procedure Laterality Date  . BREAST LUMPECTOMY    . BREAST LUMPECTOMY WITH RADIOACTIVE SEED AND SENTINEL LYMPH NODE BIOPSY Left 09/11/2016   Procedure: LEFT BREAST LUMPECTOMY WITH RADIOACTIVE SEED AND LEFT AXILLARY SENTINEL LYMPH NODE BIOPSY WITH BLUE DYE INJECTION;  Surgeon: [name], MD;  Location: [facility];  Service: General;  Laterality: Left;  . IR FLUORO GUIDE PORT INSERTION RIGHT  09/13/2016  . IR US GUIDE VASC ACCESS RIGHT  09/13/2016  . MASTECTOMY     Right  . PORTACATH PLACEMENT N/A 09/11/2016   Procedure: ATTEMPTED INSERTION PORT-A-CATH WITH ULTRA SOUND GUIDANCE;  Surgeon: [name], MD;  Location: [facility];  Service: General;  Laterality: N/A;  . REDUCTION MAMMAPLASTY  Left 2005  . THYROIDECTOMY  2007    FAMILY HISTORY Family History  Problem Relation Age of Onset  . Stroke Mother   . Allergies Mother   . Asthma Mother   . Clotting disorder Mother   . Heart disease Father 61       MI  . Allergies Sister   . Asthma Sister   . Breast cancer Sister 75       Lymphoma 79; currently 75  . Asthma Brother   . Leukemia Brother        dx 43s  . Allergies Daughter   . Asthma Daughter   . Allergies Sister   . Breast cancer Sister 20       currently 52  . Cancer Paternal Aunt        kidney ca; deceased 67  . Cancer Paternal Uncle        unk. primary ("liver")  . Throat cancer Maternal Grandmother        deceased 29  . Lung cancer Maternal Grandfather        deceased 92  . Pancreatic cancer Paternal Grandfather        deceased 22  . Cancer Paternal Aunt        "abdominal"; deceased 65  Family history includes a sister, a paternal aunt, and a paternal cousin with breast cancer all older then 29 at diagnosis  GYN HISTORY: Menarche age 25, first live birth age 51. She is GX P3. She went through the change of life age 31. She did not take hormone replacement.  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 Her husband [name] [name] is retired from [employer]. Her children are [name] [name], who works for time [employer] in [place], and [name] [name], who lives in [place] and works for [employer]. The third child, [name] [name], died in an automobile accident at age 91. The patient has one grandchild. 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 Smoking status: Never Smoker  . Smokeless tobacco: Never Used  . Alcohol use No     Colonoscopy:  PAP:  Bone density:  Lipid panel:  Allergies  Allergen Reactions  . Pneumococcal Vaccines Other (See Comments)    Was really sick   . Sulfa Antibiotics Other (See Comments)    Pt believes it was hives  .  Sulfacetamide Sodium-Sulfur   . Tape Itching, Dermatitis and Rash  . Tegaderm Ag Mesh [Silver] Itching and Rash    Current Outpatient Prescriptions  Medication Sig Dispense Refill  . ALPRAZolam (XANAX) 0.25 MG tablet Take 0.125-0.25 mg by mouth 2 (two) times daily as needed for anxiety or sleep (Pt takes a half of a tablet every evening).     [name] amoxicillin (AMOXIL) 500 MG tablet Take 500 mg by mouth 1 day or 1 dose.    [name] azithromycin (ZITHROMAX Z-PAK) 250 MG tablet Take as directed 6 each 0  . cholecalciferol (VITAMIN D) 1000 units tablet Take 1,000 Units by mouth daily.    [name]  dexamethasone (DECADRON) 4 MG tablet Take 1 tablet twice a day with food AS NEEDED for nausea 30 tablet 1  . DULoxetine (CYMBALTA) 60 MG capsule Take 60 mg by mouth every morning.     [name] levothyroxine (SYNTHROID, LEVOTHROID) 112 MCG tablet TAKE 1 TABLET (112 MCG TOTAL) BY MOUTH DAILY. 90 tablet 3  . lidocaine-prilocaine (EMLA) cream Apply to affected area once 30 g 3  . losartan (COZAAR) 100 MG tablet TAKE 1 TABLET BY MOUTH EVERY DAY FOR BLOOD PRESSURE 90 tablet 1  . prochlorperazine (COMPAZINE) 10 MG tablet Take before meals and at bedtime day after chemo, then as needed for nausea 30 tablet 1  . rosuvastatin (CRESTOR) 20 MG tablet Take 1 tablet (20 mg total) by mouth daily. 90 tablet 2  . Syringe/Needle, Disp, (B-D ECLIPSE SYRINGE) 30G X 1/2" 1 ML MISC 1 each by Does not apply route 1 day or 1 dose. For Vit B12 inj 50 each 11  . vitamin B-12 (CYANOCOBALAMIN) 1000 MCG tablet Take 1,000 mcg by mouth every other day.     No current facility-administered medications for this visit.     OBJECTIVE: Older white woman in no acute distress  Vitals:   12/11/16 1139  BP: (!) 145/91  Pulse: (!) 102  Resp: 18  Temp: 97.8 F (36.6 C)  SpO2: 100%     Body mass index is 24.55 kg/m.    ECOG FS: 1  GENERAL: Patient is a well appearing female in no acute distress HEENT:  Sclerae anicteric. PERRL  Oropharynx clear and moist.  No ulcerations or evidence of oropharyngeal candidiasis. Neck is supple. There is no swelling noted along her left lower mandible or tenderness noted to palpation NODES:  No cervical, supraclavicular, or axillary lymphadenopathy palpated.  BREAST EXAM:  Thickness at left lumpectomy site, likely scar tissue. LUNGS:  Clear to auscultation bilaterally.  No wheezes or rhonchi. HEART:  Regular rate and rhythm. No murmur appreciated. ABDOMEN:  Soft, nontender.  Positive, normoactive bowel sounds. No organomegaly palpated. MSK:  No focal spinal tenderness to palpation. Full range of motion bilaterally in the upper extremities. EXTREMITIES:  No peripheral edema.   SKIN:  Clear with no obvious rashes or skin changes. No nail dyscrasia. NEURO:  Nonfocal. Well oriented.  Appropriate affect.      LAB RESULTS: Lab Results  Component Value Date   WBC 3.5 (L) 12/11/2016   NEUTROABS 1.4 (L) 12/11/2016   HGB 9.6 (L) 12/11/2016   HCT 28.5 (L) 12/11/2016   MCV 99.5 12/11/2016   PLT 307 12/11/2016      Chemistry      Component Value Date/Time   NA 139 12/11/2016 1017   K 3.7 12/11/2016 1017   CL 108 09/13/2016 0728   CL 104 10/09/2011 1403   CO2 22 12/11/2016 1017   BUN 16.9 12/11/2016 1017   CREATININE 1.2 (H) 12/11/2016 1017      Component Value Date/Time   CALCIUM 9.0 12/11/2016 1017   ALKPHOS 139 12/11/2016 1017   AST 65 (H) 12/11/2016 1017   ALT 51 12/11/2016 1017   BILITOT 0.42 12/11/2016 1017       Lab Results  Component Value Date   LABCA2 16 10/09/2011    No components found for: [id number]  No results for input(s): INR in the last 168 hours.  Urinalysis    Component Value Date/Time   COLORURINE YELLOW 06/05/2015 1559   APPEARANCEUR CLEAR 06/05/2015 1559   LABSPEC 1.020 06/05/2015 1559   PHURINE  5.5 06/05/2015 1559   GLUCOSEU NEGATIVE 06/05/2015 1559   HGBUR NEGATIVE 06/05/2015 1559   BILIRUBINUR NEGATIVE 06/05/2015 1559   KETONESUR NEGATIVE 06/05/2015 1559    UROBILINOGEN 0.2 06/05/2015 1559   NITRITE NEGATIVE 06/05/2015 1559   LEUKOCYTESUR MODERATE (A) 06/05/2015 1559    STUDIES: No results found.    ASSESSMENT: 76 y.o. [place] woman   (1) status post right lumpectomy and sentinel lymph node biopsy in September 2002 for multifocal breast carcinoma, triple negative.  Treated with CMF followed by radiation.   (2) Local recurrence in April 2004, status post right modified radical mastectomy with TRAM reconstruction for what proved to be a T1c N0, stage IA triple negative breast carcinoma.  Treated adjuvantly with paclitaxel and doxorubicin x4 in 2004.  Off treatment since August 2004 with no evidence of recurrence  (3) history of papillary thyroid cancer s/p thyroidectomy June 2007, s/p radioactive iodine July 2007  (4) status post left breast upper outer quadrant biopsy 08/21/2016 for a clinical T1b N0, stage IB invasive ductal carcinoma, grade 3, weakly estrogen receptor positive, progesterone receptor and HER-2 negative, with an MIB-1 of 80%.  (5) left lumpectomy and sentinel lymph node sampling 09/11/2016 found a pT1b pN0, stage IB invasive ductal carcinoma, grade 3, with negative margins.  (6) adjuvant chemotherapy consisting of carboplatin and gemcitabine given days 1 and 8 of each 21 day cycle 4 cycles, started 09/18/2016 (Neupogen on days 2 and 3, and Onpro on day 8 for chemotherapy induced neutropenia), last dose December 11, 2016 (7) adjuvant radiation to follow  (8) genetics testing pending.  PLAN:  Carin completes her chemotherapy treatments today.  She has done remarkably well with this.  She will be ready to remove her port at any time scar tissue more medially.  She will feel much more secure if we go ahead and image that so I set her up for a left mammogram and left ultrasound of the breast that is convenient.  She will see [place] in about 10 days just to make sure that she is recovered well from the treatment.  I think what  she is feeling in the left breast is partly a seroma laterally and partly small bit of scar tissue more medially.  She will feel better if we go ahead and image that so I have put in those orders.  I am making her an appointment with her radiation oncologist so she can decide the pros and cons of adjuvant radiation.  While she is reluctant she would be willing to undergo it if it will make a significant difference to her risk of local recurrence. The ointment of course is that her cancer is estrogen receptor positive but only weakly so.  I do not believe she was ever scheduled for genetics counseling.  We will make sure that happens after the next visit.  She knows to call for any other issues that may develop before then.     Kimble Delaurentis, [name], MD  12/11/16 11:52 AM Medical Oncology and Hematology [facility] [address] [place], [place] [address] Tel. [phone]    Fax. [phone]  This document serves as a record of services personally performed by Lurline Del, MD. It was created on her behalf by Steva Colder, a trained medical scribe. The creation of this record is based on the scribe's personal observations and the provider's statements to them. This document has been checked and approved by the attending provider.

## 2016-12-11 ENCOUNTER — Ambulatory Visit: Payer: Medicare Other

## 2016-12-11 ENCOUNTER — Telehealth: Payer: Self-pay | Admitting: Oncology

## 2016-12-11 ENCOUNTER — Ambulatory Visit (HOSPITAL_BASED_OUTPATIENT_CLINIC_OR_DEPARTMENT_OTHER): Payer: Medicare Other

## 2016-12-11 ENCOUNTER — Other Ambulatory Visit (HOSPITAL_BASED_OUTPATIENT_CLINIC_OR_DEPARTMENT_OTHER): Payer: Medicare Other

## 2016-12-11 ENCOUNTER — Ambulatory Visit (HOSPITAL_BASED_OUTPATIENT_CLINIC_OR_DEPARTMENT_OTHER): Payer: Medicare Other | Admitting: Oncology

## 2016-12-11 VITALS — BP 145/91 | HR 102 | Temp 97.8°F | Resp 18 | Ht 64.0 in | Wt 143.0 lb

## 2016-12-11 DIAGNOSIS — Z170 Estrogen receptor positive status [ER+]: Principal | ICD-10-CM

## 2016-12-11 DIAGNOSIS — Z5111 Encounter for antineoplastic chemotherapy: Secondary | ICD-10-CM

## 2016-12-11 DIAGNOSIS — C50412 Malignant neoplasm of upper-outer quadrant of left female breast: Secondary | ICD-10-CM

## 2016-12-11 DIAGNOSIS — Z853 Personal history of malignant neoplasm of breast: Secondary | ICD-10-CM | POA: Diagnosis not present

## 2016-12-11 DIAGNOSIS — C50012 Malignant neoplasm of nipple and areola, left female breast: Secondary | ICD-10-CM

## 2016-12-11 LAB — CBC WITH DIFFERENTIAL/PLATELET
BASO%: 0.9 % (ref 0.0–2.0)
BASOS ABS: 0 10*3/uL (ref 0.0–0.1)
EOS ABS: 0 10*3/uL (ref 0.0–0.5)
EOS%: 0.8 % (ref 0.0–7.0)
HEMATOCRIT: 28.5 % — AB (ref 34.8–46.6)
HEMOGLOBIN: 9.6 g/dL — AB (ref 11.6–15.9)
LYMPH#: 1.5 10*3/uL (ref 0.9–3.3)
LYMPH%: 43.4 % (ref 14.0–49.7)
MCH: 33.7 pg (ref 25.1–34.0)
MCHC: 33.8 g/dL (ref 31.5–36.0)
MCV: 99.5 fL (ref 79.5–101.0)
MONO#: 0.5 10*3/uL (ref 0.1–0.9)
MONO%: 14.3 % — ABNORMAL HIGH (ref 0.0–14.0)
NEUT#: 1.4 10*3/uL — ABNORMAL LOW (ref 1.5–6.5)
NEUT%: 40.6 % (ref 38.4–76.8)
PLATELETS: 307 10*3/uL (ref 145–400)
RBC: 2.86 10*6/uL — ABNORMAL LOW (ref 3.70–5.45)
RDW: 19.8 % — AB (ref 11.2–14.5)
WBC: 3.5 10*3/uL — ABNORMAL LOW (ref 3.9–10.3)

## 2016-12-11 LAB — COMPREHENSIVE METABOLIC PANEL
ALK PHOS: 139 U/L (ref 40–150)
ALT: 51 U/L (ref 0–55)
AST: 65 U/L — AB (ref 5–34)
Albumin: 4 g/dL (ref 3.5–5.0)
Anion Gap: 11 mEq/L (ref 3–11)
BUN: 16.9 mg/dL (ref 7.0–26.0)
CO2: 22 meq/L (ref 22–29)
CREATININE: 1.2 mg/dL — AB (ref 0.6–1.1)
Calcium: 9 mg/dL (ref 8.4–10.4)
Chloride: 106 mEq/L (ref 98–109)
EGFR: 43 mL/min/{1.73_m2} — AB (ref >60)
GLUCOSE: 122 mg/dL (ref 70–140)
Potassium: 3.7 mEq/L (ref 3.5–5.1)
Sodium: 139 mEq/L (ref 136–145)
Total Bilirubin: 0.42 mg/dL (ref 0.20–1.20)
Total Protein: 7.4 g/dL (ref 6.4–8.3)

## 2016-12-11 MED ORDER — SODIUM CHLORIDE 0.9 % IV SOLN
800.0000 mg/m2 | Freq: Once | INTRAVENOUS | Status: AC
  Administered 2016-12-11: 1368 mg via INTRAVENOUS
  Filled 2016-12-11: qty 35.98

## 2016-12-11 MED ORDER — SODIUM CHLORIDE 0.9 % IV SOLN
Freq: Once | INTRAVENOUS | Status: AC
  Administered 2016-12-11: 13:00:00 via INTRAVENOUS

## 2016-12-11 MED ORDER — PALONOSETRON HCL INJECTION 0.25 MG/5ML
INTRAVENOUS | Status: AC
Start: 2016-12-11 — End: 2016-12-11
  Filled 2016-12-11: qty 5

## 2016-12-11 MED ORDER — SODIUM CHLORIDE 0.9 % IV SOLN
130.4000 mg | Freq: Once | INTRAVENOUS | Status: AC
  Administered 2016-12-11: 130 mg via INTRAVENOUS
  Filled 2016-12-11: qty 13

## 2016-12-11 MED ORDER — SODIUM CHLORIDE 0.9% FLUSH
10.0000 mL | INTRAVENOUS | Status: DC | PRN
  Administered 2016-12-11: 10 mL
  Filled 2016-12-11: qty 10

## 2016-12-11 MED ORDER — PALONOSETRON HCL INJECTION 0.25 MG/5ML
0.2500 mg | Freq: Once | INTRAVENOUS | Status: AC
  Administered 2016-12-11: 0.25 mg via INTRAVENOUS

## 2016-12-11 MED ORDER — HEPARIN SOD (PORK) LOCK FLUSH 100 UNIT/ML IV SOLN
500.0000 [IU] | Freq: Once | INTRAVENOUS | Status: AC | PRN
  Administered 2016-12-11: 500 [IU]
  Filled 2016-12-11: qty 5

## 2016-12-11 MED ORDER — PEGFILGRASTIM 6 MG/0.6ML ~~LOC~~ PSKT
6.0000 mg | PREFILLED_SYRINGE | Freq: Once | SUBCUTANEOUS | Status: AC
  Administered 2016-12-11: 6 mg via SUBCUTANEOUS
  Filled 2016-12-11: qty 0.6

## 2016-12-11 NOTE — Patient Instructions (Signed)
[facility] Discharge Instructions for Patients Receiving Chemotherapy  Today you received the following chemotherapy agents Gemzar and Carboplatin   To help prevent nausea and vomiting after your treatment, we encourage you to take your nausea medication as directed.    If you develop nausea and vomiting that is not controlled by your nausea medication, call the clinic.   BELOW ARE SYMPTOMS THAT SHOULD BE REPORTED IMMEDIATELY:  *FEVER GREATER THAN 100.5 F  *CHILLS WITH OR WITHOUT FEVER  NAUSEA AND VOMITING THAT IS NOT CONTROLLED WITH YOUR NAUSEA MEDICATION  *UNUSUAL SHORTNESS OF BREATH  *UNUSUAL BRUISING OR BLEEDING  TENDERNESS IN MOUTH AND THROAT WITH OR WITHOUT PRESENCE OF ULCERS  *URINARY PROBLEMS  *BOWEL PROBLEMS  UNUSUAL RASH Items with * indicate a potential emergency and should be followed up as soon as possible.  Feel free to call the clinic should you have any questions or concerns. The clinic phone number is [phone].  Please show the CHEMO ALERT CARD at check-in to the Emergency Department and triage nurse.   

## 2016-12-11 NOTE — Telephone Encounter (Signed)
Left message for patient regarding appt added per 10/31 sch msg.

## 2016-12-11 NOTE — Telephone Encounter (Signed)
Patient will receive AVS and calendar of upcoming appointments in treatment area.

## 2016-12-11 NOTE — Progress Notes (Signed)
Per Dr Jana Hakim, [place] to treat today with ANC of 1.4

## 2016-12-13 ENCOUNTER — Ambulatory Visit: Payer: Medicare Other

## 2016-12-13 ENCOUNTER — Encounter: Payer: Self-pay | Admitting: Radiation Oncology

## 2016-12-13 ENCOUNTER — Encounter: Payer: Self-pay | Admitting: Genetic Counselor

## 2016-12-13 ENCOUNTER — Ambulatory Visit: Payer: Self-pay | Admitting: Genetic Counselor

## 2016-12-13 DIAGNOSIS — Z1379 Encounter for other screening for genetic and chromosomal anomalies: Secondary | ICD-10-CM

## 2016-12-13 NOTE — Progress Notes (Signed)
Cancer Genetics Clinic       Genetic Test Results    Patient Name: [name] [name] Patient DOB: 01-Oct-1940 Patient Age: 76 y.o. Encounter Date: 12/13/2016  Referring Provider: Lurline Del, MD  Primary Care Provider: Plotnikov, Evie Lacks, MD   [name] was called today to discuss genetic test results. Please see the Genetics note from her visit on 12/05/2016 for a detailed discussion of her personal and family history.  Genetic Testing: At the time of [name]' visit, she opted to pursue genetic testing of multiple genes associated with hereditary susceptibility to cancer. Testing included sequencing and deletion/duplication analysis. 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  A copy of the genetic test report will be scanned into Epic under the media tab.  The genes on the panel were the 83 genes on [facility]'s Multi-Cancer panel (ALK, APC, ATM, AXIN2, BAP1, BARD1, BLM, BMPR1A, BRCA1, BRCA2, BRIP1, CASR, CDC73, CDH1, CDK4, CDKN1B, CDKN1C, CDKN2A, CEBPA, CHEK2, CTNNA1, DICER1, DIS3L2, EGFR, EPCAM, FH, FLCN, GATA2, GPC3, GREM1, HOXB13, HRAS, KIT, MAX, MEN1, MET, MITF, MLH1, MSH2, MSH3, MSH6, MUTYH, NBN, NF1, NF2, NTHL1, PALB2, PDGFRA, PHOX2B, PMS2, POLD1, POLE, POT1, PRKAR1A, PTCH1, PTEN, RAD50, RAD51C, RAD51D, RB1, RECQL4, RET, RUNX1, SDHA, SDHAF2, SDHB, SDHC, SDHD, SMAD4, SMARCA4, SMARCB1, SMARCE1, STK11, SUFU, TERC, TERT, TMEM127, TP53, TSC1, TSC2, VHL, WRN, WT1).  NTHL1 Gene: The NTHL1 gene is associated with autosomal recessive NTHL1-associated polyposis. This is an adult-onset hereditary cancer predisposition syndrome characterized by the development of multiple colonic adenomas, often with progression to colorectal cancer. There is no evidence suggesting that having a single NTHL1 mutation increases cancer risk.  Cancer Screening: We emphasized that being a carrier of a single NTHL1 mutation is not thought to increase  cancer risk. [name] is recommended to follow cancer screenings recommended by her physicians given her history of bilateral breast cancer and thyroid cancer.  Family Members: It is important that [name] informs her relatives of these results so they can decide whether to pursue genetic testing. [name]' children have a 50% chance to have inherited this mutation from her and also be carriers. Because her husband has passed away, her children may wish to pursue testing to ensure they do not have biallelic NTHL1 mutations which significantly increases cancer risk. They are recommended to speak with a genetic counselor prior to any testing. A genetic counselor can be located at [email or web address].  Her daughter and possibly other female relatives do need to be aware of the strong family history of breast cancer and discuss appropriate screenings with their own providers.   Lastly, cancer genetics is a rapidly advancing field and it is possible that new genetic tests will be appropriate for [name] in the future. We encourage her to remain in contact with [place] on an annual basis so we can update her personal and family histories, and let her know of advances in cancer genetics that may benefit the family. Our contact number was provided. [name] is welcome to call anytime with additional questions.     Steele Berg, MS, [place] Certified Genetic Counselor phone: [phone]

## 2016-12-16 NOTE — Progress Notes (Addendum)
Location of Breast Cancer:Left Breast Upper inner  9 'o'clock position Histology per Pathology Report: Diagnosis 07/22/2016: Breast, left, needle core biopsy, 9:00 o'clock - INVASIVE DUCTAL CARCINOMA.  Receptor Status: ER(5%+), PR (0%), Her2-neu (neg), Ki-(80%)  Did patient present with symptoms (if so, please note symptoms) or was this found on screening mammography?: mammography    Past/Anticipated interventions by surgeon, if any:Diagnosis 8/11/218: 1. Breast, lumpectomy, Left - INVASIVE DUCTAL CARCINOMA, GRADE III/III, SPANNING 0.8 CM. - DUCTAL CARCINOMA IN SITU, HIGH GRADE. - THE SURGICAL RESECTION MARGINS ARE NEGATIVE FOR CARCINOMA. - SEE ONCOLOGY TABLE BELOW. 2. Lymph node, sentinel, biopsy, Left axillary #1 - THERE IS NO CARCINOMA IN 1 OF 1 LYMPH NODE (0/1). 3. Lymph node, sentinel, biopsy, Left axillary #2 - THERE IS NO CARCINOMA IN 1 OF 1 LYMPH NODE (0/1). 4. Lymph node, sentinel, biopsy, Left axillary #3 - THERE IS NO CARCINOMA IN 1 OF 1 LYMPH NODE (0/1).  Past/Anticipated interventions by medical oncology, if any: Chemotherapy 12/11/16:Dr. Magrinat,MD, Hx right breast cancer,  Dx 2004, recurrent 2006,  INTERVAL HISTORY: 12/11/16,genetics testing pending Chante returns today for follow-up and treatment of her weekly estrogen receptor positive breast cancer.  Today is day 8 cycle 4 of 4 planned cycles of carboplatin and gemcitabine, which she receives on days 1 and 8 of each 21-day cycle.  She received Neupogen on days 2 and 3 of each cycle and Neulasta on day 9.     Lymphedema issues, if any:  None  Pain issues, if any:  None  SAFETY ISSUES:  Prior radiation? Yes, 2002 right breast 07/30/2001-09/18/2001 50.4 Gy 28 fx ,of 1.8 Gy; surgical bed 64.4 gy with 7 additional fx of 2Gy  Pacemaker/ICD? NO  Is the patient on methotrexate? No  Current Complaints / other details:  Married, menarche age 12, GXP3, 1st live birth age 19 NO HRT, no tobacco products,alcohol or drug  use, Thyroid cancer,  Sister,paternal aunt and paternal cousin with breast cancer dx older than 60, Maternal grandmother throat cancer, Maternal grandfather lung ca, Paternal grandfather pancreatic  ca, paternal aunt abdominal cancer  Allergies: Pneumonia vaccine, Sulfa antibiotics, tape,tegadermAg mesh(silver) sulfacetamide sodium -sulfer  Patient states that she completed chemo 12/11/2016  Vitals:   12/19/16 1342  BP: (!) 117/51  Pulse: (!) 108  Resp: 18  Temp: 98 F (36.7 C)  TempSrc: Oral  SpO2: 100%  Weight: 143 lb 2 oz (64.9 kg)   Wt Readings from Last 3 Encounters:  12/19/16 143 lb 2 oz (64.9 kg)  12/11/16 143 lb (64.9 kg)  12/04/16 143 lb 2 oz (64.9 kg)       McElroy, Janice Bruner, RN 12/16/2016,11:48 AM   

## 2016-12-19 ENCOUNTER — Other Ambulatory Visit: Payer: Self-pay

## 2016-12-19 ENCOUNTER — Ambulatory Visit
Admission: RE | Admit: 2016-12-19 | Discharge: 2016-12-19 | Disposition: A | Discharge status: Home / Self Care | Payer: Medicare Other | Source: Ambulatory Visit | Attending: Radiation Oncology | Admitting: Radiation Oncology

## 2016-12-19 ENCOUNTER — Encounter: Payer: Self-pay | Admitting: Radiation Oncology

## 2016-12-19 VITALS — BP 117/51 | HR 108 | Temp 98.0°F | Resp 18 | Wt 143.1 lb

## 2016-12-19 DIAGNOSIS — Z803 Family history of malignant neoplasm of breast: Secondary | ICD-10-CM | POA: Insufficient documentation

## 2016-12-19 DIAGNOSIS — E890 Postprocedural hypothyroidism: Secondary | ICD-10-CM | POA: Insufficient documentation

## 2016-12-19 DIAGNOSIS — I10 Essential (primary) hypertension: Secondary | ICD-10-CM | POA: Diagnosis not present

## 2016-12-19 DIAGNOSIS — Z9011 Acquired absence of right breast and nipple: Secondary | ICD-10-CM | POA: Diagnosis not present

## 2016-12-19 DIAGNOSIS — E785 Hyperlipidemia, unspecified: Secondary | ICD-10-CM | POA: Insufficient documentation

## 2016-12-19 DIAGNOSIS — Z170 Estrogen receptor positive status [ER+]: Secondary | ICD-10-CM | POA: Diagnosis not present

## 2016-12-19 DIAGNOSIS — C50212 Malignant neoplasm of upper-inner quadrant of left female breast: Secondary | ICD-10-CM | POA: Diagnosis not present

## 2016-12-19 DIAGNOSIS — Z85850 Personal history of malignant neoplasm of thyroid: Secondary | ICD-10-CM | POA: Diagnosis not present

## 2016-12-19 DIAGNOSIS — Z9221 Personal history of antineoplastic chemotherapy: Secondary | ICD-10-CM | POA: Diagnosis not present

## 2016-12-19 DIAGNOSIS — L409 Psoriasis, unspecified: Secondary | ICD-10-CM | POA: Insufficient documentation

## 2016-12-19 DIAGNOSIS — C50412 Malignant neoplasm of upper-outer quadrant of left female breast: Secondary | ICD-10-CM | POA: Diagnosis not present

## 2016-12-19 DIAGNOSIS — Z923 Personal history of irradiation: Secondary | ICD-10-CM | POA: Diagnosis not present

## 2016-12-19 DIAGNOSIS — Z510 Encounter for antineoplastic radiation therapy: Secondary | ICD-10-CM | POA: Insufficient documentation

## 2016-12-19 DIAGNOSIS — Z853 Personal history of malignant neoplasm of breast: Secondary | ICD-10-CM | POA: Diagnosis not present

## 2016-12-20 NOTE — Progress Notes (Signed)
Radiation Oncology         [phone] ________________________________  Name: [name] [name]        MRN: [id number]  Date of Service: 12/19/2016 DOB: 04/03/1940  [id number], Evie Lacks, MD  Magrinat, [name], MD     REFERRING PHYSICIAN: Magrinat, [name], MD   DIAGNOSIS: The encounter diagnosis was Malignant neoplasm of upper-inner quadrant of left female breast, unspecified estrogen receptor status ([place]).   HISTORY OF PRESENT ILLNESS: [name] [name] is a 76 y.o. female seen at the request of Dr. Jana Hakim for a new diagnosis of left breast cancer. The patient has a history of triple negative breast cancer on the right which she underwent right lumpectomy sentinel node evaluation, followed by systemic chemo and radiotherapy in 2003. She had a local recurrence in the right breast which led to right mastectomy in 2004. She has been followed in surveillance since, and during routine mammography a new lesion was seen in the left breast. This measured 8 mm area in the left breast at 9:00. She had ultrasound that measured this at 6 mm, and her axilla was negative. She underwent left breast biopsy that revealed 5% ER positivity, PR and HER2 were negative. She underwent lumpectomy and sentinel node on 09/11/16 and her tumor measured 8 mm, the tumor was grade 3, negative margins, and all three sampled nodes were negative. Given the weakly positive ER pathway she has been treated as though this is triple negative disease and completed 4 cycles of chemotherapy which she last received on 12/11/16. She comes today to discuss the options of adjuvant radiotherapy to the left breast.    PREVIOUS RADIATION THERAPY: Yes   07/30/2001-09/18/2001:  Right breast treated to 50.4 Gy 28 fx ,of 1.8 Gy; surgical bed 64.4 gy with 7 additional fx of 2Gy     PAST MEDICAL HISTORY:  Past Medical History:  Diagnosis Date  . Allergic rhinitis   . Anxiety   . Breast cancer ([place]) hx 2004   recurrent 2006 DR Magrinat    . Family history of breast cancer   . Genetic testing 12/05/2016   Multi-Cancer panel (83 genes) @ [facility] - Monoallelic mutation in [place] (carrier)  . HTN (hypertension)   . Hyperlipidemia   . Hypothyroidism   . Personal history of chemotherapy 2002  . Personal history of radiation therapy 2002  . Psoriasis   . S/P thyroidectomy 07/2005   2 cm largest diameter (1 other tiny focus)/ i-131 rx 99 mci 08/2005  . Thyroid cancer ([place])    Papillary Stage 1 - Dr Loanne Drilling  . Vitamin B12 deficiency 2009  . Vitamin D deficiency 2009       PAST SURGICAL HISTORY: Past Surgical History:  Procedure Laterality Date  . BREAST LUMPECTOMY    . IR FLUORO GUIDE PORT INSERTION RIGHT  09/13/2016  . IR US GUIDE VASC ACCESS RIGHT  09/13/2016  . MASTECTOMY     Right  . REDUCTION MAMMAPLASTY Left 2005  . THYROIDECTOMY  2007     FAMILY HISTORY:  Family History  Problem Relation Age of Onset  . Stroke Mother   . Allergies Mother   . Asthma Mother   . Clotting disorder Mother   . Heart disease Father 6       MI  . Allergies Sister   . Asthma Sister   . Breast cancer Sister 89       Lymphoma 29; currently 54  . Asthma Brother   . Leukemia Brother  dx 47s  . Allergies Daughter   . Asthma Daughter   . Allergies Sister   . Breast cancer Sister 71       currently 61  . Cancer Paternal Aunt        kidney ca; deceased 71  . Cancer Paternal Uncle        unk. primary ("liver")  . Throat cancer Maternal Grandmother        deceased 30  . Lung cancer Maternal Grandfather        deceased 62  . Pancreatic cancer Paternal Grandfather        deceased 7  . Cancer Paternal Aunt        "abdominal"; deceased 70     SOCIAL HISTORY:  reports that  has never smoked. she has never used smokeless tobacco. She reports that she does not drink alcohol or use drugs. 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 She lives near [place].   ALLERGIES: Pneumococcal vaccines; Sulfa antibiotics;  Sulfacetamide sodium-sulfur; Tape; and Tegaderm ag mesh [silver]   MEDICATIONS:  Current Outpatient Medications  Medication Sig Dispense Refill  . ALPRAZolam (XANAX) 0.25 MG tablet Take 0.125-0.25 mg by mouth 2 (two) times daily as needed for anxiety or sleep (Pt takes a half of a tablet every evening).     . cholecalciferol (VITAMIN D) 1000 units tablet Take 1,000 Units by mouth daily.    . DULoxetine (CYMBALTA) 60 MG capsule Take 60 mg by mouth every morning.     [name] levothyroxine (SYNTHROID, LEVOTHROID) 112 MCG tablet TAKE 1 TABLET (112 MCG TOTAL) BY MOUTH DAILY. 90 tablet 3  . lidocaine-prilocaine (EMLA) cream Apply to affected area once 30 g 3  . losartan (COZAAR) 100 MG tablet TAKE 1 TABLET BY MOUTH EVERY DAY FOR BLOOD PRESSURE 90 tablet 1  . rosuvastatin (CRESTOR) 20 MG tablet Take 1 tablet (20 mg total) by mouth daily. 90 tablet 2  . Syringe/Needle, Disp, (B-D ECLIPSE SYRINGE) 30G X 1/2" 1 ML MISC 1 each by Does not apply route 1 day or 1 dose. For Vit B12 inj 50 each 11  . vitamin B-12 (CYANOCOBALAMIN) 1000 MCG tablet Take 1,000 mcg by mouth every other day.    [name] amoxicillin (AMOXIL) 500 MG tablet Take 500 mg by mouth 1 day or 1 dose.    [name] azithromycin (ZITHROMAX Z-PAK) 250 MG tablet Take as directed (Patient not taking: Reported on 12/19/2016) 6 each 0  . dexamethasone (DECADRON) 4 MG tablet Take 1 tablet twice a day with food AS NEEDED for nausea (Patient not taking: Reported on 12/19/2016) 30 tablet 1  . prochlorperazine (COMPAZINE) 10 MG tablet Take before meals and at bedtime day after chemo, then as needed for nausea (Patient not taking: Reported on 12/19/2016) 30 tablet 1   No current facility-administered medications for this encounter.      REVIEW OF SYSTEMS: On review of systems, the patient reports that she is doing well overall. She reports some soreness in the surgical site. She denies any chest pain, shortness of breath, cough, fevers, chills, night sweats, unintended weight  changes. She denies any bowel or bladder disturbances, and denies abdominal pain, nausea or vomiting. She denies any new musculoskeletal or joint aches or pains. A complete review of systems is obtained and is otherwise negative.     PHYSICAL EXAM:  Wt Readings from Last 3 Encounters:  12/19/16 143 lb 2 oz (64.9 kg)  12/11/16 143 lb (64.9 kg)  12/04/16 143 lb  2 oz (64.9 kg)   Temp Readings from Last 3 Encounters:  12/19/16 98 F (36.7 C) (Oral)  12/11/16 97.8 F (36.6 C) (Oral)  12/06/16 98 F (36.7 C) (Oral)   BP Readings from Last 3 Encounters:  12/19/16 (!) 117/51  12/11/16 (!) 145/91  12/06/16 114/71   Pulse Readings from Last 3 Encounters:  12/19/16 (!) 108  12/11/16 (!) 102  12/06/16 98   Pain Assessment Pain Score: 0-No pain/10  In general this is a well appearing caucasian female in no acute distress. She is alert and oriented x4 and appropriate throughout the examination. HEENT reveals that the patient is normocephalic, atraumatic. EOMs are intact. PERRLA. Skin is intact without any evidence of gross lesions. Cardiopulmonary assessment is negative for acute distress and she exhibits normal effort. The left breast lumpectomy and axillary incision sites are intact without erythema or separation. .   ECOG = 0  0 - Asymptomatic (Fully active, able to carry on all predisease activities without restriction)  1 - Symptomatic but completely ambulatory (Restricted in physically strenuous activity but ambulatory and able to carry out work of a light or sedentary nature. For example, light housework, office work)  2 - Symptomatic, <50% in bed during the day (Ambulatory and capable of all self care but unable to carry out any work activities. Up and about more than 50% of waking hours)  3 - Symptomatic, >50% in bed, but not bedbound (Capable of only limited self-care, confined to bed or chair 50% or more of waking hours)  4 - Bedbound (Completely disabled. Cannot carry on  any self-care. Totally confined to bed or chair)  5 - Death   Eustace Pen MM, Creech RH, Tormey DC, et al. [phone]). "Toxicity and response criteria of the [facility]". [place] Oncol. 5 (6): 649-55    LABORATORY DATA:  Lab Results  Component Value Date   WBC 3.5 (L) 12/11/2016   HGB 9.6 (L) 12/11/2016   HCT 28.5 (L) 12/11/2016   MCV 99.5 12/11/2016   PLT 307 12/11/2016   Lab Results  Component Value Date   NA 139 12/11/2016   K 3.7 12/11/2016   CL 108 09/13/2016   CO2 22 12/11/2016   Lab Results  Component Value Date   ALT 51 12/11/2016   AST 65 (H) 12/11/2016   ALKPHOS 139 12/11/2016   BILITOT 0.42 12/11/2016      RADIOGRAPHY: No results found.     IMPRESSION/PLAN: 1.  Stage IA, pT1bN0M0 grade 3, triple negative invasive ductal carcinoma of the left breast. Dr. Lisbeth Renshaw discusses the pathology findings and reviews the nature of triple negative breast disease. She has completed treatment with surgery and chemotherapy. Dr. Lisbeth Renshaw discusses the role now for adjuvant radiotherapy to prevent local recurrence. We discussed the risks, benefits, short, and long term effects of radiotherapy, and the patient is interested in proceeding. Dr. Lisbeth Renshaw discusses the delivery and logistics of radiotherapy and anticipates a course of 4 weeks with deep inspiration breath hold technique. Written consent is obtained and placed in the chart, a copy was provided to the patient. She will return on 12/25/16 at 3pm for simulation. 2. History of triple negative right breast cancer. The patient continues to be followed expectantly and will be while she undergoes treatment for #1.  In a visit lasting 60 minutes, greater than 50% of the time was spent face to face discussing the rationale for radiotherapy, and coordinating the patient's care.   The above documentation  reflects my direct findings during this shared patient visit. Please see the separate note by Dr. Lisbeth Renshaw on this date for  the remainder of the patient's plan of care.    Carola Rhine, PAC

## 2016-12-24 NOTE — Progress Notes (Signed)
. [name]   DOB: [birth year]  MR#: [id number]  CSN#:[id number]  PCP: [name], MD Patient Care Team: [name], MD as PCP - General Magrinat, [name], MD as Consulting Physician (Oncology) [name], MD as Consulting Physician (General Surgery) [name], MD as Consulting Physician (Gastroenterology)   CHIEF COMPLAINT: Weakly estrogen receptor positive breast cancer  CURRENT TREATMENT: Adjuvant radiation pending   INTERVAL HISTORY: [name] [name] returns today for follow-up and treatment of her weekly estrogen receptor positive breast cancer.  She completed her adjuvant chemotherapy in December 11, 2016. She reports that she feels fatigued very often.  Since the last visit here she met with Dr. Lisbeth Renshaw in radiation oncology and is scheduled for CT simulation today.   REVIEW OF SYSTEMS: [name] [name] reports that she occasionally feel ill. She reports feeling some nausea at times. She also notes that she occasionally has a short twinge and sensitivity in her left breast. She notes that her heart rate was elevated and her BP was low. She reports that she still experiences heart palpitations, and she gets tired easily. She reports that she is currently not exercising due to fatigue. She reports that she is glad that she hasn't lost her hair due to chemotherapy. She denies unusual headaches, visual changes, vomiting, or dizziness. There has been no unusual cough, phlegm production, or pleurisy. This been no change in bowel or bladder habits. She denies unexplained fatigue or unexplained weight loss, bleeding, rash, or fever. A detailed review of systems was otherwise stable.    BREAST CANCER HISTORY: From the recent summary note:  I last saw [name] [name] in 2013 when she was released from follow-up from her right breast cancer recurrence in 2004. She is status post right mastectomy and adjuvant chemotherapy for that triple negative tumor. More recently, on 07/16/2016 she  underwent left screening mammography at the [facility] on 07/16/2016. This found the breast density to be category B. There was a possible asymmetry in the left breast, and on 07/19/2016 she underwent left diagnostic mammography with tomography and left breast ultrasonography. This confirmed a well circumscribed oval mass at the 9:00 position of the left breast measuring approximately 0.8 cm. This was not palpable. On ultrasonography the mass measured 0.6 cm and it was located at the 9:00 radiant 4 cm from the nipple. The left axilla was sonographically benign.  On June 11 [place] underwent biopsy of the left breast mass in question and this showed (SAA [phone]) and invasive ductal carcinoma, grade 3, estrogen receptor 5% positive with weak staining intensity, progesterone receptor negative, with an MIB-1 of 80%, and HER-2 nonamplified, the signals ratio being 1.46 and the number per cell 1.90.  Her subsequent history is as detailed below.   PAST MEDICAL HISTORY: Past Medical History:  Diagnosis Date  . Allergic rhinitis   . Anxiety   . Breast cancer ([place]) hx 2004   recurrent 2006 DR Magrinat  . Family history of breast cancer   . 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 HTN (hypertension)   . Hyperlipidemia   . Hypothyroidism   . Personal history of chemotherapy 2002  . Personal history of radiation therapy 2002  . Psoriasis   . S/P thyroidectomy 07/2005   2 cm largest diameter (1 other tiny focus)/ i-131 rx 99 mci 08/2005  . Thyroid cancer ([place])    Papillary Stage 1 - Dr Loanne Drilling  . Vitamin B12 deficiency  2009  . Vitamin D deficiency 2009    PAST SURGICAL HISTORY: Past Surgical History:  Procedure Laterality Date  . BREAST LUMPECTOMY    . IR FLUORO GUIDE PORT INSERTION RIGHT  09/13/2016  . IR US GUIDE VASC ACCESS RIGHT  09/13/2016  . MASTECTOMY     Right  . REDUCTION MAMMAPLASTY Left 2005  . THYROIDECTOMY   2007    FAMILY HISTORY Family History  Problem Relation Age of Onset  . Stroke Mother   . Allergies Mother   . Asthma Mother   . Clotting disorder Mother   . Heart disease Father 61       MI  . Allergies Sister   . Asthma Sister   . Breast cancer Sister 69       Lymphoma 53; currently 35  . Asthma Brother   . Leukemia Brother        dx 45s  . Allergies Daughter   . Asthma Daughter   . Allergies Sister   . Breast cancer Sister 28       currently 66  . Cancer Paternal Aunt        kidney ca; deceased 43  . Cancer Paternal Uncle        unk. primary ("liver")  . Throat cancer Maternal Grandmother        deceased 1  . Lung cancer Maternal Grandfather        deceased 85  . Pancreatic cancer Paternal Grandfather        deceased 41  . Cancer Paternal Aunt        "abdominal"; deceased 60  Family history includes a sister, a paternal aunt, and a paternal cousin with breast cancer all older then 67 at diagnosis  GYN HISTORY: Menarche age 76, first live birth age 54. She is GX P3. She went through the change of life age 76. She did not take hormone replacement.  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 Her husband [name] is retired from [employer]. Her children are [name] [name], who works for time [employer] in [place], and [name] [name], who lives in [place] and works for [employer]. The third child, [name] [name], died in an automobile accident at age 61. The patient has one grandchild. 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 Smoking status: Never Smoker  . Smokeless tobacco: Never Used  Substance Use Topics  . Alcohol use: No  . Drug use: No     Colonoscopy:  PAP:  Bone density:  Lipid panel:  Allergies  Allergen Reactions  . Pneumococcal Vaccines Other (See Comments)    Was really sick   . Sulfa Antibiotics Other (See Comments)    Pt believes it was hives   . Sulfacetamide Sodium-Sulfur   . Tape Itching, Dermatitis and Rash  . Tegaderm Ag Mesh [Silver] Itching and Rash    Current Outpatient Medications  Medication Sig Dispense Refill  . ALPRAZolam (XANAX) 0.25 MG tablet Take 0.125-0.25 mg by mouth 2 (two) times daily as needed for anxiety or sleep (Pt takes a half of a tablet every evening).     . cholecalciferol (VITAMIN D) 1000 units tablet Take 1,000 Units by mouth daily.    . DULoxetine (CYMBALTA) 60 MG capsule Take 60 mg by mouth every morning.     [name] levothyroxine (SYNTHROID, LEVOTHROID) 112 MCG tablet TAKE 1 TABLET (112 MCG TOTAL) BY  MOUTH DAILY. 90 tablet 3  . lidocaine-prilocaine (EMLA) cream Apply to affected area once 30 g 3  . losartan (COZAAR) 100 MG tablet TAKE 1 TABLET BY MOUTH EVERY DAY FOR BLOOD PRESSURE 90 tablet 1  . rosuvastatin (CRESTOR) 20 MG tablet Take 1 tablet (20 mg total) by mouth daily. 90 tablet 2  . venlafaxine XR (EFFEXOR-XR) 75 MG 24 hr capsule Take 1 capsule (75 mg total) daily with breakfast by mouth. 30 capsule 6  . vitamin B-12 (CYANOCOBALAMIN) 1000 MCG tablet Take 1,000 mcg by mouth every other day.     No current facility-administered medications for this visit.     OBJECTIVE: Older white woman who appears stated age  89:   12/25/16 1400  BP: 138/86  Pulse: (!) 124  Resp: 20  Temp: 98.2 F (36.8 C)  SpO2: 100%     Body mass index is 24.7 kg/m.    ECOG FS: 1  Sclerae unicteric, pupils round and equal Oropharynx clear and moist No cervical or supraclavicular adenopathy Lungs no rales or rhonchi Heart regular rate and rhythm Abd soft, nontender, positive bowel sounds MSK no focal spinal tenderness, no upper extremity lymphedema Neuro: nonfocal, well oriented, appropriate affect Breasts: The right breast is status post lumpectomy.  There are the expected changes but no evidence of local recurrence.  Left breast is unremarkable to exam today.  Both axillae are benign.   LAB RESULTS: Lab  Results  Component Value Date   WBC 3.5 (L) 12/11/2016   NEUTROABS 1.4 (L) 12/11/2016   HGB 9.6 (L) 12/11/2016   HCT 28.5 (L) 12/11/2016   MCV 99.5 12/11/2016   PLT 307 12/11/2016      Chemistry      Component Value Date/Time   NA 139 12/11/2016 1017   K 3.7 12/11/2016 1017   CL 108 09/13/2016 0728   CL 104 10/09/2011 1403   CO2 22 12/11/2016 1017   BUN 16.9 12/11/2016 1017   CREATININE 1.2 (H) 12/11/2016 1017      Component Value Date/Time   CALCIUM 9.0 12/11/2016 1017   ALKPHOS 139 12/11/2016 1017   AST 65 (H) 12/11/2016 1017   ALT 51 12/11/2016 1017   BILITOT 0.42 12/11/2016 1017       Lab Results  Component Value Date   LABCA2 16 10/09/2011    No components found for: [id number]  No results for input(s): INR in the last 168 hours.  Urinalysis    Component Value Date/Time   COLORURINE YELLOW 06/05/2015 1559   APPEARANCEUR CLEAR 06/05/2015 1559   LABSPEC 1.020 06/05/2015 1559   PHURINE 5.5 06/05/2015 1559   GLUCOSEU NEGATIVE 06/05/2015 1559   HGBUR NEGATIVE 06/05/2015 1559   BILIRUBINUR NEGATIVE 06/05/2015 1559   KETONESUR NEGATIVE 06/05/2015 1559   UROBILINOGEN 0.2 06/05/2015 1559   NITRITE NEGATIVE 06/05/2015 1559   LEUKOCYTESUR MODERATE (A) 06/05/2015 1559    STUDIES: No results found.   ASSESSMENT: 76 y.o. [place] woman   (1) status post right lumpectomy and sentinel lymph node biopsy in September 2002 for multifocal breast carcinoma, triple negative.  Treated with CMF followed by radiation.   (2) Local recurrence in April 2004, status post right modified radical mastectomy with TRAM reconstruction for what proved to be a T1c N0, stage IA triple negative breast carcinoma.  Treated adjuvantly with paclitaxel and doxorubicin x4 in 2004.  Off treatment since August 2004 with no evidence of recurrence  (3) history of papillary thyroid cancer s/p thyroidectomy June 2007, s/p radioactive  iodine July 2007  (4) status post left breast upper outer  quadrant biopsy 08/21/2016 for a clinical T1b N0, stage IB invasive ductal carcinoma, grade 3, weakly estrogen receptor positive, progesterone receptor and HER-2 negative, with an MIB-1 of 80%.  (5) left lumpectomy and sentinel lymph node sampling 09/11/2016 found a pT1b pN0, stage IB invasive ductal carcinoma, grade 3, with negative margins.  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*)  (a) there were no deleterious mutations in ALK, APC, ATM, AXIN2, BAP1, BARD1, BLM, BMPR1A, BRCA1, BRCA2, BRIP1, CASR, CDC73, CDH1, CDK4, CDKN1B, CDKN1C, CDKN2A, CEBPA, CHEK2, CTNNA1, DICER1, DIS3L2, EGFR, EPCAM, FH, FLCN, GATA2, GPC3, GREM1, HOXB13, HRAS, KIT, MAX, MEN1, MET, MITF, MLH1, MSH2, MSH3, MSH6, MUTYH, NBN, NF1, NF2, NTHL1, PALB2, PDGFRA, PHOX2B, PMS2, POLD1, POLE, POT1, PRKAR1A, PTCH1, PTEN, RAD50, RAD51C, RAD51D, RB1, RECQL4, RET, RUNX1, SDHA, SDHAF2, SDHB, SDHC, SDHD, SMAD4, SMARCA4, SMARCB1, SMARCE1, STK11, SUFU, TERC, TERT, TMEM127, TP53, TSC1, TSC2, VHL, WRN, WT1  (b) while homozygous mutations in the NPH L1 gene are associated with autosomal recessive polyposis, there is no evidence to suggest that a single mutation in this gene increases cancer risk.  PLAN:  Joie did well with her chemotherapy.  She still has some fatigue from it.  I expect she will be even more fatigued from her radiation and therefore were not going to be starting antiestrogens until next year.  She is experiencing symptoms of posttraumatic stress.  I think she would do well to start an antidepressant.  We discussed venlafaxine  in detail.  She has a good understanding of the possible toxicities, side effects and complications of this agent.  She will start at half dose, 75 mg daily.  She will call in a few weeks to let us know how she is doing.  I reassured her that her plan to undergo radiation is the right one.  I think she would be a good candidate for our finding you are near normal group and I gave her information regarding that  We also discussed her genetics mutation.  She understands she is not at risk for polyposis.  However it is important for other family members to be aware and she is making sure that happens  She will see me again in January.  She knows to call for any problems that may develop before then.   Magrinat, [name], MD  12/25/16 2:27 PM Medical Oncology and Hematology [facility] [address] [place], [place] [address] Tel. [phone]    Fax. [phone]   This document serves as a record of services personally performed by Lurline Del, MD. It was created on his behalf by Sheron Nightingale, a trained medical scribe. The creation of this record is based on the scribe's personal observations and the provider's statements to them.    I have reviewed the above documentation for accuracy and completeness, and I agree with the above.

## 2016-12-25 ENCOUNTER — Telehealth: Payer: Self-pay | Admitting: Oncology

## 2016-12-25 ENCOUNTER — Ambulatory Visit (HOSPITAL_BASED_OUTPATIENT_CLINIC_OR_DEPARTMENT_OTHER): Payer: Medicare Other | Admitting: Oncology

## 2016-12-25 ENCOUNTER — Ambulatory Visit
Admission: RE | Admit: 2016-12-25 | Discharge: 2016-12-25 | Disposition: A | Discharge status: Home / Self Care | Payer: Medicare Other | Source: Ambulatory Visit | Attending: Radiation Oncology | Admitting: Radiation Oncology

## 2016-12-25 VITALS — BP 138/86 | HR 124 | Temp 98.2°F | Resp 20 | Ht 64.0 in | Wt 143.9 lb

## 2016-12-25 DIAGNOSIS — R5383 Other fatigue: Secondary | ICD-10-CM

## 2016-12-25 DIAGNOSIS — Z9221 Personal history of antineoplastic chemotherapy: Secondary | ICD-10-CM | POA: Diagnosis not present

## 2016-12-25 DIAGNOSIS — Z170 Estrogen receptor positive status [ER+]: Principal | ICD-10-CM

## 2016-12-25 DIAGNOSIS — R110 Nausea: Secondary | ICD-10-CM

## 2016-12-25 DIAGNOSIS — Z510 Encounter for antineoplastic radiation therapy: Secondary | ICD-10-CM | POA: Diagnosis not present

## 2016-12-25 DIAGNOSIS — Z1379 Encounter for other screening for genetic and chromosomal anomalies: Secondary | ICD-10-CM

## 2016-12-25 DIAGNOSIS — Z803 Family history of malignant neoplasm of breast: Secondary | ICD-10-CM | POA: Diagnosis not present

## 2016-12-25 DIAGNOSIS — C50412 Malignant neoplasm of upper-outer quadrant of left female breast: Secondary | ICD-10-CM | POA: Diagnosis not present

## 2016-12-25 DIAGNOSIS — Z9011 Acquired absence of right breast and nipple: Secondary | ICD-10-CM | POA: Diagnosis not present

## 2016-12-25 DIAGNOSIS — R002 Palpitations: Secondary | ICD-10-CM

## 2016-12-25 MED ORDER — VENLAFAXINE HCL ER 75 MG PO CP24: 75.0000 mg | ORAL_CAPSULE | Freq: Every day | ORAL | 6 refills | Status: DC

## 2016-12-25 NOTE — Telephone Encounter (Signed)
Scheduled appt per 11/14 los. Pt didn't want avs or calendar.

## 2016-12-25 NOTE — Progress Notes (Signed)
  Radiation Oncology         (336) [phone] ________________________________  Name: [name] [name] MRN: [id number]  Date: 12/25/2016  DOB: 1940/10/13   DIAGNOSIS:     ICD-10-CM   1. Malignant neoplasm of upper-outer quadrant of left breast in female, estrogen receptor positive ([place]) C50.412    Z17.0     SIMULATION AND TREATMENT PLANNING NOTE  The patient presented for simulation prior to beginning her course of radiation treatment for her diagnosis of left-sided breast cancer. The patient was placed in a supine position on a breast board. A customized vac-lock bag was constructed and this complex treatment device will be used on a daily basis during her treatment. In this fashion, a CT scan was obtained through the chest area and an isocenter was placed near the chest wall within the breast.  The patient will be planned to receive a course of radiation initially to a dose of 42.5 Gy. This will consist of a whole breast radiotherapy technique. To accomplish this, 2 customized blocks have been designed which will correspond to medial and lateral whole breast tangent fields. This treatment will be accomplished at 2.5 Gy per fraction. A forward planning technique will also be evaluated to determine if this approach improves the plan. It is anticipated that the patient will then receive a 7.5 Gy boost to the seroma cavity which has been contoured. This will be accomplished at 2.5 Gy per fraction.   This initial treatment will consist of a 3-D conformal technique. The seroma has been contoured as the primary target structure. Additionally, dose volume histograms of both this target as well as the lungs and heart will also be evaluated. Such an approach is necessary to ensure that the target area is adequately covered while the nearby critical  normal structures are adequately spared.  Plan:  The final anticipated total dose therefore will correspond to 50  Gy.    _______________________________   Jodelle Gross, MD, PhD

## 2016-12-25 NOTE — Progress Notes (Signed)
  Radiation Oncology         [phone]) [phone] ________________________________  Name: ELYSIA GRAND MRN: [id number]  Date: 12/25/2016  DOB: 02-Apr-1940  Optical Surface Tracking Plan:  Since intensity modulated radiotherapy (IMRT) and 3D conformal radiation treatment methods are predicated on accurate and precise positioning for treatment, intrafraction motion monitoring is medically necessary to ensure accurate and safe treatment delivery.  The ability to quantify intrafraction motion without excessive ionizing radiation dose can only be performed with optical surface tracking. Accordingly, surface imaging offers the opportunity to obtain 3D measurements of patient position throughout IMRT and 3D treatments without excessive radiation exposure.  I am ordering optical surface tracking for this patient's upcoming course of radiotherapy. ________________________________  [name], MD 12/25/2016 6:02 PM    Reference:   Particia Jasper, et al. Surface imaging-based analysis of intrafraction motion for breast radiotherapy patients.Journal of [place], n. 6, nov. 2014. ISSN [id number].   Available at: <http://www.jacmp.org/index.php/jacmp/article/view/4957>.

## 2016-12-27 ENCOUNTER — Other Ambulatory Visit: Payer: Self-pay | Admitting: General Surgery

## 2016-12-27 ENCOUNTER — Ambulatory Visit: Payer: Medicare Other

## 2016-12-27 ENCOUNTER — Other Ambulatory Visit: Payer: Self-pay | Admitting: Radiology

## 2016-12-27 ENCOUNTER — Ambulatory Visit
Admission: RE | Admit: 2016-12-27 | Discharge: 2016-12-27 | Disposition: A | Discharge status: Home / Self Care | Payer: Medicare Other | Source: Ambulatory Visit | Attending: Oncology | Admitting: Oncology

## 2016-12-27 DIAGNOSIS — C50412 Malignant neoplasm of upper-outer quadrant of left female breast: Secondary | ICD-10-CM

## 2016-12-27 DIAGNOSIS — Z170 Estrogen receptor positive status [ER+]: Principal | ICD-10-CM

## 2016-12-27 DIAGNOSIS — R928 Other abnormal and inconclusive findings on diagnostic imaging of breast: Secondary | ICD-10-CM | POA: Diagnosis not present

## 2016-12-30 ENCOUNTER — Ambulatory Visit (HOSPITAL_COMMUNITY)
Admission: RE | Admit: 2016-12-30 | Discharge: 2016-12-30 | Disposition: A | Discharge status: Home / Self Care | Payer: Medicare Other | Source: Ambulatory Visit | Attending: Oncology | Admitting: Oncology

## 2016-12-30 ENCOUNTER — Encounter (HOSPITAL_COMMUNITY): Payer: Self-pay

## 2016-12-30 DIAGNOSIS — Z9011 Acquired absence of right breast and nipple: Secondary | ICD-10-CM | POA: Diagnosis not present

## 2016-12-30 DIAGNOSIS — F419 Anxiety disorder, unspecified: Secondary | ICD-10-CM | POA: Diagnosis not present

## 2016-12-30 DIAGNOSIS — Z923 Personal history of irradiation: Secondary | ICD-10-CM | POA: Diagnosis not present

## 2016-12-30 DIAGNOSIS — C50919 Malignant neoplasm of unspecified site of unspecified female breast: Secondary | ICD-10-CM | POA: Diagnosis not present

## 2016-12-30 DIAGNOSIS — E538 Deficiency of other specified B group vitamins: Secondary | ICD-10-CM | POA: Insufficient documentation

## 2016-12-30 DIAGNOSIS — E559 Vitamin D deficiency, unspecified: Secondary | ICD-10-CM | POA: Diagnosis not present

## 2016-12-30 DIAGNOSIS — Z170 Estrogen receptor positive status [ER+]: Secondary | ICD-10-CM

## 2016-12-30 DIAGNOSIS — E785 Hyperlipidemia, unspecified: Secondary | ICD-10-CM | POA: Insufficient documentation

## 2016-12-30 DIAGNOSIS — E890 Postprocedural hypothyroidism: Secondary | ICD-10-CM | POA: Diagnosis not present

## 2016-12-30 DIAGNOSIS — I10 Essential (primary) hypertension: Secondary | ICD-10-CM | POA: Insufficient documentation

## 2016-12-30 DIAGNOSIS — Z853 Personal history of malignant neoplasm of breast: Secondary | ICD-10-CM | POA: Insufficient documentation

## 2016-12-30 DIAGNOSIS — Z9221 Personal history of antineoplastic chemotherapy: Secondary | ICD-10-CM | POA: Insufficient documentation

## 2016-12-30 DIAGNOSIS — C50412 Malignant neoplasm of upper-outer quadrant of left female breast: Secondary | ICD-10-CM

## 2016-12-30 DIAGNOSIS — Z5111 Encounter for antineoplastic chemotherapy: Secondary | ICD-10-CM | POA: Diagnosis not present

## 2016-12-30 DIAGNOSIS — Z452 Encounter for adjustment and management of vascular access device: Secondary | ICD-10-CM | POA: Insufficient documentation

## 2016-12-30 HISTORY — PX: IR REMOVAL TUN ACCESS W/ PORT W/O FL MOD SED: IMG2290

## 2016-12-30 LAB — BASIC METABOLIC PANEL
Anion gap: 8 (ref 5–15)
BUN: 14 mg/dL (ref 6–20)
CALCIUM: 9.3 mg/dL (ref 8.9–10.3)
CO2: 26 mmol/L (ref 22–32)
CREATININE: 1.11 mg/dL — AB (ref 0.44–1.00)
Chloride: 105 mmol/L (ref 101–111)
GFR calc Af Amer: 54 mL/min — ABNORMAL LOW (ref >60)
GFR, EST NON AFRICAN AMERICAN: 47 mL/min — AB (ref >60)
GLUCOSE: 95 mg/dL (ref 65–99)
Potassium: 4.3 mmol/L (ref 3.5–5.1)
Sodium: 139 mmol/L (ref 135–145)

## 2016-12-30 LAB — CBC WITH DIFFERENTIAL/PLATELET
BASOS ABS: 0 10*3/uL (ref 0.0–0.1)
BASOS PCT: 0 %
EOS PCT: 1 %
Eosinophils Absolute: 0 10*3/uL (ref 0.0–0.7)
HCT: 34 % — ABNORMAL LOW (ref 36.0–46.0)
Hemoglobin: 11.1 g/dL — ABNORMAL LOW (ref 12.0–15.0)
Lymphocytes Relative: 29 %
Lymphs Abs: 1.7 10*3/uL (ref 0.7–4.0)
MCH: 34.2 pg — ABNORMAL HIGH (ref 26.0–34.0)
MCHC: 32.6 g/dL (ref 30.0–36.0)
MCV: 104.6 fL — AB (ref 78.0–100.0)
MONO ABS: 1 10*3/uL (ref 0.1–1.0)
Monocytes Relative: 18 %
Neutro Abs: 3 10*3/uL (ref 1.7–7.7)
Neutrophils Relative %: 52 %
PLATELETS: 391 10*3/uL (ref 150–400)
RBC: 3.25 MIL/uL — ABNORMAL LOW (ref 3.87–5.11)
RDW: 19.8 % — AB (ref 11.5–15.5)
WBC: 5.7 10*3/uL (ref 4.0–10.5)

## 2016-12-30 LAB — PROTIME-INR
INR: 0.98
Prothrombin Time: 12.9 seconds (ref 11.4–15.2)

## 2016-12-30 MED ORDER — CEFAZOLIN SODIUM-DEXTROSE 2-4 GM/100ML-% IV SOLN
INTRAVENOUS | Status: AC
  Filled 2016-12-30: qty 100

## 2016-12-30 MED ORDER — CEFAZOLIN SODIUM-DEXTROSE 2-4 GM/100ML-% IV SOLN
2.0000 g | INTRAVENOUS | Status: AC
  Administered 2016-12-30: 2 g via INTRAVENOUS

## 2016-12-30 MED ORDER — SODIUM CHLORIDE 0.9 % IV SOLN: INTRAVENOUS | Status: DC

## 2016-12-30 MED ORDER — LIDOCAINE-EPINEPHRINE (PF) 2 %-1:200000 IJ SOLN
INTRAMUSCULAR | Status: AC | PRN
  Administered 2016-12-30: 10 mL via INTRADERMAL

## 2016-12-30 MED ORDER — LIDOCAINE-EPINEPHRINE (PF) 2 %-1:200000 IJ SOLN
INTRAMUSCULAR | Status: AC
  Filled 2016-12-30: qty 20

## 2016-12-30 NOTE — Discharge Instructions (Signed)
Moderate Conscious Sedation, Adult, Care After These instructions provide you with information about caring for yourself after your procedure. Your health care provider may also give you more specific instructions. Your treatment has been planned according to current medical practices, but problems sometimes occur. Call your health care provider if you have any problems or questions after your procedure. What can I expect after the procedure? After your procedure, it is common:  To feel sleepy for several hours.  To feel clumsy and have poor balance for several hours.  To have poor judgment for several hours.  To vomit if you eat too soon.  Follow these instructions at home: For at least 24 hours after the procedure:   Do not: ? Participate in activities where you could fall or become injured. ? Drive. ? Use heavy machinery. ? Drink alcohol. ? Take sleeping pills or medicines that cause drowsiness. ? Make important decisions or sign legal documents. ? Take care of children on your own.  Rest. Eating and drinking  Follow the diet recommended by your health care provider.  If you vomit: ? Drink water, juice, or soup when you can drink without vomiting. ? Make sure you have little or no nausea before eating solid foods. General instructions  Have a responsible adult stay with you until you are awake and alert.  Take over-the-counter and prescription medicines only as told by your health care provider.  If you smoke, do not smoke without supervision.  Keep all follow-up visits as told by your health care provider. This is important. Contact a health care provider if:  You keep feeling nauseous or you keep vomiting.  You feel light-headed.  You develop a rash.  You have a fever. Get help right away if:  You have trouble breathing. This information is not intended to replace advice given to you by your health care provider. Make sure you discuss any questions you have  with your health care provider. Document Released: 11/18/2012 Document Revised: 07/03/2015 Document Reviewed: 05/20/2015 Elsevier Interactive Patient Education  2018 [place] Removal, Care After Refer to this sheet in the next few weeks. These instructions provide you with information about caring for yourself after your procedure. Your health care provider may also give you more specific instructions. Your treatment has been planned according to current medical practices, but problems sometimes occur. Call your health care provider if you have any problems or questions after your procedure. What can I expect after the procedure? After the procedure, it is common to have:  Soreness or pain near your incision.  Some swelling or bruising near your incision.  Follow these instructions at home: Medicines  Take over-the-counter and prescription medicines only as told by your health care provider.  If you were prescribed an antibiotic medicine, take it as told by your health care provider. Do not stop taking the antibiotic even if you start to feel better. Bathing  Do not take baths, swim, or use a hot tub until your health care provider approves. Ask your health care provider if you can take showers. You may only be allowed to take sponge baths for bathing.  You may shower tomorrow. Incision care  Follow instructions from your health care provider about how to take care of your incision. Make sure you: ? Wash your hands with soap and water before you change your bandage (dressing). If soap and water are not available, use hand sanitizer. ? Change your dressing as told by your  health care provider.  You may remove dressing tomorrow. ? Keep your dressing dry. ? Leave stitches (sutures), skin glue, or adhesive strips in place. These skin closures may need to stay in place for 2 weeks or longer. If adhesive strip edges start to loosen and curl up, you may trim the loose  edges. Do not remove adhesive strips completely unless your health care provider tells you to do that.  Check your incision area every day for signs of infection. Check for: ? More redness, swelling, or pain. ? More fluid or blood. ? Warmth. ? Pus or a bad smell. Driving  If you received a sedative, do not drive for 24 hours after the procedure.  If you did not receive a sedative, ask your health care provider when it is safe to drive. Activity  Return to your normal activities as told by your health care provider. Ask your health care provider what activities are safe for you.  Until your health care provider says it is safe: ? Do not lift anything that is heavier than 10 lb (4.5 kg). ? Do not do activities that involve lifting your arms over your head. General instructions  Do not use any tobacco products, such as cigarettes, chewing tobacco, and e-cigarettes. Tobacco can delay healing. If you need help quitting, ask your health care provider.  Keep all follow-up visits as told by your health care provider. This is important. Contact a health care provider if:  You have more redness, swelling, or pain around your incision.  You have more fluid or blood coming from your incision.  Your incision feels warm to the touch.  You have pus or a bad smell coming from your incision.  You have a fever.  You have pain that is not relieved by your pain medicine. Get help right away if:  You have chest pain.  You have difficulty breathing. This information is not intended to replace advice given to you by your health care provider. Make sure you discuss any questions you have with your health care provider. Document Released: 01/09/2015 Document Revised: 07/06/2015 Document Reviewed: 11/02/2014 Elsevier Interactive Patient Education  [employer].

## 2016-12-30 NOTE — Procedures (Signed)
Successful removal of right chest port. Minimal blood loss and no immediate complication.   

## 2016-12-30 NOTE — H&P (Signed)
Chief Complaint: Patient was seen in consultation today for breast cancer  Referring Physician(s): Chauncey Cruel  Supervising Physician: Markus Daft  Patient Status: [facility] - Out-pt  History of Present Illness: [name] [name] is a 76 y.o. female with past medical history of anxiety, HTN, HLD, thyroid cancer, and breast cancer who has recently completed chemotherapy via Port-A-Cath placed 09/13/16.  Patient no longer needs her Port-A-Cath and presents for removal today.   Patient has been NPO.  She does not take blood thinners.   Past Medical History:  Diagnosis Date  . Allergic rhinitis   . Anxiety   . Breast cancer ([place]) hx 2004   recurrent 2006 DR Magrinat  . Family history of breast cancer   . 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 HTN (hypertension)   . Hyperlipidemia   . Hypothyroidism   . Personal history of chemotherapy 2002  . Personal history of radiation therapy 2002  . Psoriasis   . S/P thyroidectomy 07/2005   2 cm largest diameter (1 other tiny focus)/ i-131 rx 99 [facility] 08/2005  . Thyroid cancer ([place])    Papillary Stage 1 - Dr Loanne Drilling  . Vitamin B12 deficiency 2009  . Vitamin D deficiency 2009    Past Surgical History:  Procedure Laterality Date  . ATTEMPTED INSERTION PORT-A-CATH WITH ULTRA SOUND GUIDANCE N/A 09/11/2016   Performed by [name], MD at [facility]  . BREAST LUMPECTOMY    . IR FLUORO GUIDE PORT INSERTION RIGHT  09/13/2016  . IR US GUIDE VASC ACCESS RIGHT  09/13/2016  . LEFT BREAST LUMPECTOMY WITH RADIOACTIVE SEED AND LEFT AXILLARY SENTINEL LYMPH NODE BIOPSY WITH BLUE DYE INJECTION Left 09/11/2016   Performed by [name], MD at [facility]  . MASTECTOMY     Right  . REDUCTION MAMMAPLASTY Left 2005  . THYROIDECTOMY  2007    Allergies: Pneumococcal vaccines; Sulfa antibiotics; Sulfacetamide sodium-sulfur; Tape; and Tegaderm ag  mesh [silver]  Medications: Prior to Admission medications   Medication Sig Start Date End Date Taking? Authorizing Provider  ALPRAZolam (XANAX) 0.25 MG tablet Take 0.125-0.25 mg by mouth 2 (two) times daily as needed for anxiety or sleep (Pt takes a half of a tablet every evening).    Yes [provider]  cholecalciferol (VITAMIN D) 1000 units tablet Take 1,000 Units by mouth daily.   Yes [provider]  DULoxetine (CYMBALTA) 60 MG capsule Take 60 mg by mouth every morning.    Yes [provider]  levothyroxine (SYNTHROID, LEVOTHROID) 112 MCG tablet TAKE 1 TABLET (112 MCG TOTAL) BY MOUTH DAILY. 07/25/16  Yes Plotnikov, Evie Lacks, MD  losartan (COZAAR) 100 MG tablet TAKE 1 TABLET BY MOUTH EVERY DAY FOR BLOOD PRESSURE 06/17/16  Yes Plotnikov, Evie Lacks, MD  rosuvastatin (CRESTOR) 20 MG tablet Take 1 tablet (20 mg total) by mouth daily. 05/07/16  Yes Plotnikov, Evie Lacks, MD  venlafaxine XR (EFFEXOR-XR) 75 MG 24 hr capsule Take 1 capsule (75 mg total) daily with breakfast by mouth. 12/25/16  Yes Magrinat, [name], MD  vitamin B-12 (CYANOCOBALAMIN) 1000 MCG tablet Take 1,000 mcg by mouth every other day.   Yes [provider]  lidocaine-prilocaine (EMLA) cream Apply to affected area once 09/15/16   Magrinat, [name], MD     Family History  Problem Relation Age of Onset  . Stroke Mother   . Allergies Mother   .  Asthma Mother   . Clotting disorder Mother   . Heart disease Father 46       MI  . Allergies Sister   . Asthma Sister   . Breast cancer Sister 65       Lymphoma 9; currently 58  . Asthma Brother   . Leukemia Brother        dx 39s  . Allergies Daughter   . Asthma Daughter   . Allergies Sister   . Breast cancer Sister 9       currently 84  . Cancer Paternal Aunt        kidney ca; deceased 34  . Cancer Paternal Uncle        unk. primary ("liver")  . Throat cancer Maternal Grandmother        deceased 42  . Lung cancer Maternal Grandfather          deceased 65  . Pancreatic cancer Paternal Grandfather        deceased 46  . Cancer Paternal Aunt        "abdominal"; deceased 89    Social History   Socioeconomic History  . Marital status: Married    Spouse name: None  . Number of children: 2  . Years of education: None  . Highest education level: None  Social Needs  . Financial resource strain: None  . Food insecurity - worry: None  . Food insecurity - inability: None  . Transportation needs - medical: None  . Transportation needs - non-medical: None  Occupational History  . 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 Smoking status: Never Smoker  . Smokeless tobacco: Never Used  Substance and Sexual Activity  . Alcohol use: No  . Drug use: No  . Sexual activity: Not Currently  Other Topics Concern  . None  Social History Narrative   GI - Dr Earlean Shawl   Depression - Dr Toy Care   GYN - Dr Marianna Payment       Review of Systems  Constitutional: Negative for fatigue and fever.  Respiratory: Negative for cough and shortness of breath.   Cardiovascular: Negative for chest pain.  Gastrointestinal: Negative for abdominal pain.  Musculoskeletal: Negative for back pain.  Psychiatric/Behavioral: Negative for behavioral problems and confusion.    Vital Signs: There were no vitals taken for this visit.  Physical Exam  Constitutional: She is oriented to person, place, and time. She appears well-developed.  Cardiovascular: Normal rate, regular rhythm and normal heart sounds.  Pulmonary/Chest: Effort normal and breath sounds normal. No respiratory distress.  Abdominal: Soft.  Neurological: She is alert and oriented to person, place, and time.  Skin: Skin is warm and dry.  Palpable Port-A-Cath with well-healed scar on right chest  Psychiatric: She has a normal mood and affect. Her behavior is normal. Judgment and thought content normal.  Nursing note and vitals  reviewed.   Imaging: Mm Diag Breast Tomo Uni Left  Result Date: 12/27/2016 CLINICAL DATA:  Patient underwent lumpectomy for a breast carcinoma in August 2018. She has recently completed chemotherapy and is to begin radiation therapy. Patient has a history of a right mastectomy for breast carcinoma. EXAM: 2D DIGITAL DIAGNOSTIC UNILATERAL LEFT MAMMOGRAM WITH CAD AND ADJUNCT TOMO COMPARISON:  Previous exam(s). ACR Breast Density Category b: There are scattered areas of fibroglandular density. FINDINGS: There is focal density head architectural distortion in the lower inner aspect of the left breast  reflecting the post lumpectomy change. There are no discrete masses or other areas of architectural distortion. There are no new or suspicious calcifications. Mammographic images were processed with CAD. IMPRESSION: 1. No evidence of recurrent or new breast malignancy. 2. Expected postsurgical changes in the lower inner aspect of the left breast. RECOMMENDATION: Diagnostic left breast mammography in 1 year per standard post lumpectomy protocol. I have discussed the findings and recommendations with the patient. Results were also provided in writing at the conclusion of the visit. If applicable, a reminder letter will be sent to the patient regarding the next appointment. BI-RADS CATEGORY  2: Benign. Electronically Signed   By: Lajean Manes M.D.   On: 12/27/2016 11:28    Labs:  CBC: Recent Labs    11/20/16 1037 12/04/16 0941 12/11/16 1017 12/30/16 1157  WBC 6.1 4.6 3.5* 5.7  HGB 10.1* 10.4* 9.6* 11.1*  HCT 30.5* 30.3* 28.5* 34.0*  PLT 193 272 307 391    COAGS: Recent Labs    09/13/16 0728 12/30/16 1157  INR 0.89 0.98    BMP: Recent Labs    09/13/16 0728  11/13/16 0834 11/20/16 1038 12/04/16 0941 12/11/16 1017  NA 141   < > 141 140 141 139  K 4.1   < > 3.7 3.9 3.6 3.7  CL 108  --   --   --   --   --   CO2 27   < > 22 26 24 22   GLUCOSE 106*   < > 109 81 91 122  BUN 20   < > 17.1  14.7 12.0 16.9  CALCIUM 8.9   < > 9.3 9.2 9.2 9.0  CREATININE 1.05*   < > 1.2* 1.0 1.1 1.2*  GFRNONAA 50*  --   --   --   --   --   GFRAA 58*  --   --   --   --   --    < > = values in this interval not displayed.    LIVER FUNCTION TESTS: Recent Labs    11/13/16 0834 11/20/16 1038 12/04/16 0941 12/11/16 1017  BILITOT 0.59 0.61 0.54 0.42  AST 29 57* 28 65*  ALT 26 52 23 51  ALKPHOS 156* 133 136 139  PROT 7.2 7.2 7.4 7.4  ALBUMIN 4.0 4.0 4.0 4.0    TUMOR MARKERS: No results for input(s): AFPTM, CEA, CA199, CHROMGRNA in the last 8760 hours.  Assessment and Plan: Patient with past medical history of breast cancer presents for Port-A-Cath removal at the request of Dr. Jana Hakim. Patient presents today in their usual state of health.  She has been NPO and is not currently on blood thinners.  Risks and benefits discussed with the patient including, but not limited to bleeding, infection, pneumothorax, and need for additional procedures. All of the patient's questions were answered, patient is agreeable to proceed. Consent signed and in chart.  Thank you for this interesting consult.  I greatly enjoyed meeting [name] [name] and look forward to participating in their care.  A copy of this report was sent to the requesting provider on this date.  Electronically Signed: Docia Barrier, PA 12/30/2016, 12:57 PM   I spent a total of    15 Minutes in face to face in clinical consultation, greater than 50% of which was counseling/coordinating care for breast cancer.

## 2016-12-31 DIAGNOSIS — C50412 Malignant neoplasm of upper-outer quadrant of left female breast: Secondary | ICD-10-CM | POA: Diagnosis not present

## 2016-12-31 DIAGNOSIS — Z170 Estrogen receptor positive status [ER+]: Secondary | ICD-10-CM | POA: Diagnosis not present

## 2016-12-31 DIAGNOSIS — Z9011 Acquired absence of right breast and nipple: Secondary | ICD-10-CM | POA: Diagnosis not present

## 2016-12-31 DIAGNOSIS — Z9221 Personal history of antineoplastic chemotherapy: Secondary | ICD-10-CM | POA: Diagnosis not present

## 2016-12-31 DIAGNOSIS — Z510 Encounter for antineoplastic radiation therapy: Secondary | ICD-10-CM | POA: Diagnosis not present

## 2016-12-31 DIAGNOSIS — Z803 Family history of malignant neoplasm of breast: Secondary | ICD-10-CM | POA: Diagnosis not present

## 2017-01-06 ENCOUNTER — Ambulatory Visit
Admission: RE | Admit: 2017-01-06 | Discharge: 2017-01-06 | Disposition: A | Discharge status: Home / Self Care | Payer: Medicare Other | Source: Ambulatory Visit | Attending: Radiation Oncology | Admitting: Radiation Oncology

## 2017-01-06 DIAGNOSIS — Z170 Estrogen receptor positive status [ER+]: Secondary | ICD-10-CM | POA: Diagnosis not present

## 2017-01-06 DIAGNOSIS — Z9011 Acquired absence of right breast and nipple: Secondary | ICD-10-CM | POA: Diagnosis not present

## 2017-01-06 DIAGNOSIS — C50412 Malignant neoplasm of upper-outer quadrant of left female breast: Secondary | ICD-10-CM | POA: Diagnosis not present

## 2017-01-06 DIAGNOSIS — Z9221 Personal history of antineoplastic chemotherapy: Secondary | ICD-10-CM | POA: Diagnosis not present

## 2017-01-06 DIAGNOSIS — Z803 Family history of malignant neoplasm of breast: Secondary | ICD-10-CM | POA: Diagnosis not present

## 2017-01-06 DIAGNOSIS — Z510 Encounter for antineoplastic radiation therapy: Secondary | ICD-10-CM | POA: Diagnosis not present

## 2017-01-07 ENCOUNTER — Ambulatory Visit
Admission: RE | Admit: 2017-01-07 | Discharge: 2017-01-07 | Disposition: A | Discharge status: Home / Self Care | Payer: Medicare Other | Source: Ambulatory Visit | Attending: Radiation Oncology | Admitting: Radiation Oncology

## 2017-01-07 ENCOUNTER — Ambulatory Visit: Payer: Medicare Other

## 2017-01-07 DIAGNOSIS — Z510 Encounter for antineoplastic radiation therapy: Secondary | ICD-10-CM | POA: Diagnosis not present

## 2017-01-07 DIAGNOSIS — Z9011 Acquired absence of right breast and nipple: Secondary | ICD-10-CM | POA: Diagnosis not present

## 2017-01-07 DIAGNOSIS — C50412 Malignant neoplasm of upper-outer quadrant of left female breast: Secondary | ICD-10-CM | POA: Diagnosis not present

## 2017-01-07 DIAGNOSIS — Z803 Family history of malignant neoplasm of breast: Secondary | ICD-10-CM | POA: Diagnosis not present

## 2017-01-07 DIAGNOSIS — Z170 Estrogen receptor positive status [ER+]: Secondary | ICD-10-CM | POA: Diagnosis not present

## 2017-01-07 DIAGNOSIS — Z9221 Personal history of antineoplastic chemotherapy: Secondary | ICD-10-CM | POA: Diagnosis not present

## 2017-01-08 ENCOUNTER — Ambulatory Visit
Admission: RE | Admit: 2017-01-08 | Discharge: 2017-01-08 | Disposition: A | Discharge status: Home / Self Care | Payer: Medicare Other | Source: Ambulatory Visit | Attending: Radiation Oncology | Admitting: Radiation Oncology

## 2017-01-08 DIAGNOSIS — C50412 Malignant neoplasm of upper-outer quadrant of left female breast: Secondary | ICD-10-CM | POA: Diagnosis not present

## 2017-01-08 DIAGNOSIS — Z170 Estrogen receptor positive status [ER+]: Secondary | ICD-10-CM | POA: Diagnosis not present

## 2017-01-08 DIAGNOSIS — Z9221 Personal history of antineoplastic chemotherapy: Secondary | ICD-10-CM | POA: Diagnosis not present

## 2017-01-08 DIAGNOSIS — Z510 Encounter for antineoplastic radiation therapy: Secondary | ICD-10-CM | POA: Diagnosis not present

## 2017-01-08 DIAGNOSIS — Z9011 Acquired absence of right breast and nipple: Secondary | ICD-10-CM | POA: Diagnosis not present

## 2017-01-08 DIAGNOSIS — Z803 Family history of malignant neoplasm of breast: Secondary | ICD-10-CM | POA: Diagnosis not present

## 2017-01-08 MED ORDER — ALRA NON-METALLIC DEODORANT (RAD-ONC)
1.0000 "application " | Freq: Once | TOPICAL | Status: AC
  Administered 2017-01-08: 1 via TOPICAL

## 2017-01-08 MED ORDER — RADIAPLEXRX EX GEL
Freq: Two times a day (BID) | CUTANEOUS | Status: DC
  Administered 2017-01-08: 13:00:00 via TOPICAL

## 2017-01-08 NOTE — Progress Notes (Signed)
Pt here for patient teaching.  Pt given Radiation and You booklet, skin care instructions, Alra deodorant and Radiaplex gel.  Reviewed areas of pertinence such as fatigue, skin changes, breast tenderness and breast swelling . Pt able to give teach back of to pat skin, use unscented/gentle soap and drink plenty of water,apply Sonafine bid, avoid applying anything to skin within 4 hours of treatment, avoid wearing an under wire bra and to use an electric razor if they must shave. Pt verbalizes understanding of information given and will contact nursing with any questions or concerns.     Http://rtanswers.org/treatmentinformation/whattoexpect/index

## 2017-01-09 ENCOUNTER — Ambulatory Visit
Admission: RE | Admit: 2017-01-09 | Discharge: 2017-01-09 | Disposition: A | Discharge status: Home / Self Care | Payer: Medicare Other | Source: Ambulatory Visit | Attending: Radiation Oncology | Admitting: Radiation Oncology

## 2017-01-09 DIAGNOSIS — Z170 Estrogen receptor positive status [ER+]: Secondary | ICD-10-CM | POA: Diagnosis not present

## 2017-01-09 DIAGNOSIS — Z803 Family history of malignant neoplasm of breast: Secondary | ICD-10-CM | POA: Diagnosis not present

## 2017-01-09 DIAGNOSIS — Z9221 Personal history of antineoplastic chemotherapy: Secondary | ICD-10-CM | POA: Diagnosis not present

## 2017-01-09 DIAGNOSIS — C50412 Malignant neoplasm of upper-outer quadrant of left female breast: Secondary | ICD-10-CM | POA: Diagnosis not present

## 2017-01-09 DIAGNOSIS — Z9011 Acquired absence of right breast and nipple: Secondary | ICD-10-CM | POA: Diagnosis not present

## 2017-01-09 DIAGNOSIS — Z510 Encounter for antineoplastic radiation therapy: Secondary | ICD-10-CM | POA: Diagnosis not present

## 2017-01-10 ENCOUNTER — Ambulatory Visit: Admission: RE | Admit: 2017-01-10 | Payer: Medicare Other | Source: Ambulatory Visit

## 2017-01-13 ENCOUNTER — Ambulatory Visit
Admission: RE | Admit: 2017-01-13 | Discharge: 2017-01-13 | Disposition: A | Discharge status: Home / Self Care | Payer: Medicare Other | Source: Ambulatory Visit | Attending: Radiation Oncology | Admitting: Radiation Oncology

## 2017-01-13 DIAGNOSIS — C50412 Malignant neoplasm of upper-outer quadrant of left female breast: Secondary | ICD-10-CM | POA: Diagnosis not present

## 2017-01-13 DIAGNOSIS — Z170 Estrogen receptor positive status [ER+]: Secondary | ICD-10-CM | POA: Diagnosis not present

## 2017-01-13 DIAGNOSIS — Z510 Encounter for antineoplastic radiation therapy: Secondary | ICD-10-CM | POA: Diagnosis not present

## 2017-01-13 DIAGNOSIS — Z803 Family history of malignant neoplasm of breast: Secondary | ICD-10-CM | POA: Diagnosis not present

## 2017-01-13 DIAGNOSIS — Z9221 Personal history of antineoplastic chemotherapy: Secondary | ICD-10-CM | POA: Diagnosis not present

## 2017-01-13 DIAGNOSIS — Z9011 Acquired absence of right breast and nipple: Secondary | ICD-10-CM | POA: Diagnosis not present

## 2017-01-14 ENCOUNTER — Ambulatory Visit
Admission: RE | Admit: 2017-01-14 | Discharge: 2017-01-14 | Disposition: A | Discharge status: Home / Self Care | Payer: Medicare Other | Source: Ambulatory Visit | Attending: Radiation Oncology | Admitting: Radiation Oncology

## 2017-01-14 DIAGNOSIS — Z9011 Acquired absence of right breast and nipple: Secondary | ICD-10-CM | POA: Diagnosis not present

## 2017-01-14 DIAGNOSIS — Z510 Encounter for antineoplastic radiation therapy: Secondary | ICD-10-CM | POA: Diagnosis not present

## 2017-01-14 DIAGNOSIS — Z170 Estrogen receptor positive status [ER+]: Secondary | ICD-10-CM | POA: Diagnosis not present

## 2017-01-14 DIAGNOSIS — Z9221 Personal history of antineoplastic chemotherapy: Secondary | ICD-10-CM | POA: Diagnosis not present

## 2017-01-14 DIAGNOSIS — Z803 Family history of malignant neoplasm of breast: Secondary | ICD-10-CM | POA: Diagnosis not present

## 2017-01-14 DIAGNOSIS — C50412 Malignant neoplasm of upper-outer quadrant of left female breast: Secondary | ICD-10-CM | POA: Diagnosis not present

## 2017-01-15 ENCOUNTER — Ambulatory Visit
Admission: RE | Admit: 2017-01-15 | Discharge: 2017-01-15 | Disposition: A | Discharge status: Home / Self Care | Payer: Medicare Other | Source: Ambulatory Visit | Attending: Radiation Oncology | Admitting: Radiation Oncology

## 2017-01-15 DIAGNOSIS — Z803 Family history of malignant neoplasm of breast: Secondary | ICD-10-CM | POA: Diagnosis not present

## 2017-01-15 DIAGNOSIS — Z170 Estrogen receptor positive status [ER+]: Secondary | ICD-10-CM | POA: Diagnosis not present

## 2017-01-15 DIAGNOSIS — Z510 Encounter for antineoplastic radiation therapy: Secondary | ICD-10-CM | POA: Diagnosis not present

## 2017-01-15 DIAGNOSIS — C50412 Malignant neoplasm of upper-outer quadrant of left female breast: Secondary | ICD-10-CM | POA: Diagnosis not present

## 2017-01-15 DIAGNOSIS — Z9221 Personal history of antineoplastic chemotherapy: Secondary | ICD-10-CM | POA: Diagnosis not present

## 2017-01-15 DIAGNOSIS — Z9011 Acquired absence of right breast and nipple: Secondary | ICD-10-CM | POA: Diagnosis not present

## 2017-01-16 ENCOUNTER — Ambulatory Visit
Admission: RE | Admit: 2017-01-16 | Discharge: 2017-01-16 | Disposition: A | Discharge status: Home / Self Care | Payer: Medicare Other | Source: Ambulatory Visit | Attending: Radiation Oncology | Admitting: Radiation Oncology

## 2017-01-16 DIAGNOSIS — Z9221 Personal history of antineoplastic chemotherapy: Secondary | ICD-10-CM | POA: Diagnosis not present

## 2017-01-16 DIAGNOSIS — C50412 Malignant neoplasm of upper-outer quadrant of left female breast: Secondary | ICD-10-CM | POA: Diagnosis not present

## 2017-01-16 DIAGNOSIS — Z170 Estrogen receptor positive status [ER+]: Secondary | ICD-10-CM | POA: Diagnosis not present

## 2017-01-16 DIAGNOSIS — Z9011 Acquired absence of right breast and nipple: Secondary | ICD-10-CM | POA: Diagnosis not present

## 2017-01-16 DIAGNOSIS — Z803 Family history of malignant neoplasm of breast: Secondary | ICD-10-CM | POA: Diagnosis not present

## 2017-01-16 DIAGNOSIS — Z510 Encounter for antineoplastic radiation therapy: Secondary | ICD-10-CM | POA: Diagnosis not present

## 2017-01-17 ENCOUNTER — Ambulatory Visit
Admission: RE | Admit: 2017-01-17 | Discharge: 2017-01-17 | Disposition: A | Discharge status: Home / Self Care | Payer: Medicare Other | Source: Ambulatory Visit | Attending: Radiation Oncology | Admitting: Radiation Oncology

## 2017-01-17 DIAGNOSIS — Z9011 Acquired absence of right breast and nipple: Secondary | ICD-10-CM | POA: Diagnosis not present

## 2017-01-17 DIAGNOSIS — Z510 Encounter for antineoplastic radiation therapy: Secondary | ICD-10-CM | POA: Diagnosis not present

## 2017-01-17 DIAGNOSIS — Z9221 Personal history of antineoplastic chemotherapy: Secondary | ICD-10-CM | POA: Diagnosis not present

## 2017-01-17 DIAGNOSIS — C50412 Malignant neoplasm of upper-outer quadrant of left female breast: Secondary | ICD-10-CM | POA: Diagnosis not present

## 2017-01-17 DIAGNOSIS — Z170 Estrogen receptor positive status [ER+]: Secondary | ICD-10-CM | POA: Diagnosis not present

## 2017-01-17 DIAGNOSIS — Z803 Family history of malignant neoplasm of breast: Secondary | ICD-10-CM | POA: Diagnosis not present

## 2017-01-20 ENCOUNTER — Ambulatory Visit: Payer: Medicare Other

## 2017-01-21 ENCOUNTER — Ambulatory Visit
Admission: RE | Admit: 2017-01-21 | Discharge: 2017-01-21 | Disposition: A | Discharge status: Home / Self Care | Payer: Medicare Other | Source: Ambulatory Visit | Attending: Radiation Oncology | Admitting: Radiation Oncology

## 2017-01-21 DIAGNOSIS — Z9011 Acquired absence of right breast and nipple: Secondary | ICD-10-CM | POA: Diagnosis not present

## 2017-01-21 DIAGNOSIS — C50412 Malignant neoplasm of upper-outer quadrant of left female breast: Secondary | ICD-10-CM | POA: Diagnosis not present

## 2017-01-21 DIAGNOSIS — Z803 Family history of malignant neoplasm of breast: Secondary | ICD-10-CM | POA: Diagnosis not present

## 2017-01-21 DIAGNOSIS — Z510 Encounter for antineoplastic radiation therapy: Secondary | ICD-10-CM | POA: Diagnosis not present

## 2017-01-21 DIAGNOSIS — Z170 Estrogen receptor positive status [ER+]: Secondary | ICD-10-CM | POA: Diagnosis not present

## 2017-01-21 DIAGNOSIS — Z9221 Personal history of antineoplastic chemotherapy: Secondary | ICD-10-CM | POA: Diagnosis not present

## 2017-01-22 ENCOUNTER — Ambulatory Visit
Admission: RE | Admit: 2017-01-22 | Discharge: 2017-01-22 | Disposition: A | Discharge status: Home / Self Care | Payer: Medicare Other | Source: Ambulatory Visit | Attending: Radiation Oncology | Admitting: Radiation Oncology

## 2017-01-22 DIAGNOSIS — Z510 Encounter for antineoplastic radiation therapy: Secondary | ICD-10-CM | POA: Diagnosis not present

## 2017-01-22 DIAGNOSIS — C50412 Malignant neoplasm of upper-outer quadrant of left female breast: Secondary | ICD-10-CM | POA: Diagnosis not present

## 2017-01-22 DIAGNOSIS — Z9221 Personal history of antineoplastic chemotherapy: Secondary | ICD-10-CM | POA: Diagnosis not present

## 2017-01-22 DIAGNOSIS — Z9011 Acquired absence of right breast and nipple: Secondary | ICD-10-CM | POA: Diagnosis not present

## 2017-01-22 DIAGNOSIS — Z170 Estrogen receptor positive status [ER+]: Secondary | ICD-10-CM | POA: Diagnosis not present

## 2017-01-22 DIAGNOSIS — Z803 Family history of malignant neoplasm of breast: Secondary | ICD-10-CM | POA: Diagnosis not present

## 2017-01-23 ENCOUNTER — Ambulatory Visit: Payer: Medicare Other

## 2017-01-24 ENCOUNTER — Ambulatory Visit
Admission: RE | Admit: 2017-01-24 | Discharge: 2017-01-24 | Disposition: A | Discharge status: Home / Self Care | Payer: Medicare Other | Source: Ambulatory Visit | Attending: Radiation Oncology | Admitting: Radiation Oncology

## 2017-01-24 DIAGNOSIS — C50412 Malignant neoplasm of upper-outer quadrant of left female breast: Secondary | ICD-10-CM

## 2017-01-24 DIAGNOSIS — Z510 Encounter for antineoplastic radiation therapy: Secondary | ICD-10-CM | POA: Diagnosis not present

## 2017-01-24 DIAGNOSIS — Z170 Estrogen receptor positive status [ER+]: Principal | ICD-10-CM

## 2017-01-24 DIAGNOSIS — Z803 Family history of malignant neoplasm of breast: Secondary | ICD-10-CM | POA: Diagnosis not present

## 2017-01-24 DIAGNOSIS — Z9221 Personal history of antineoplastic chemotherapy: Secondary | ICD-10-CM | POA: Diagnosis not present

## 2017-01-24 DIAGNOSIS — Z9011 Acquired absence of right breast and nipple: Secondary | ICD-10-CM | POA: Diagnosis not present

## 2017-01-25 ENCOUNTER — Ambulatory Visit: Payer: Medicare Other

## 2017-01-27 ENCOUNTER — Ambulatory Visit
Admission: RE | Admit: 2017-01-27 | Discharge: 2017-01-27 | Disposition: A | Discharge status: Home / Self Care | Payer: Medicare Other | Source: Ambulatory Visit | Attending: Radiation Oncology | Admitting: Radiation Oncology

## 2017-01-27 DIAGNOSIS — Z803 Family history of malignant neoplasm of breast: Secondary | ICD-10-CM | POA: Diagnosis not present

## 2017-01-27 DIAGNOSIS — Z9011 Acquired absence of right breast and nipple: Secondary | ICD-10-CM | POA: Diagnosis not present

## 2017-01-27 DIAGNOSIS — Z510 Encounter for antineoplastic radiation therapy: Secondary | ICD-10-CM | POA: Diagnosis not present

## 2017-01-27 DIAGNOSIS — Z9221 Personal history of antineoplastic chemotherapy: Secondary | ICD-10-CM | POA: Diagnosis not present

## 2017-01-27 DIAGNOSIS — Z170 Estrogen receptor positive status [ER+]: Secondary | ICD-10-CM | POA: Diagnosis not present

## 2017-01-27 DIAGNOSIS — C50412 Malignant neoplasm of upper-outer quadrant of left female breast: Secondary | ICD-10-CM | POA: Diagnosis not present

## 2017-01-28 ENCOUNTER — Ambulatory Visit: Payer: Medicare Other

## 2017-01-29 ENCOUNTER — Ambulatory Visit
Admission: RE | Admit: 2017-01-29 | Discharge: 2017-01-29 | Disposition: A | Discharge status: Home / Self Care | Payer: Medicare Other | Source: Ambulatory Visit | Attending: Radiation Oncology | Admitting: Radiation Oncology

## 2017-01-29 DIAGNOSIS — Z510 Encounter for antineoplastic radiation therapy: Secondary | ICD-10-CM | POA: Diagnosis not present

## 2017-01-29 DIAGNOSIS — C50412 Malignant neoplasm of upper-outer quadrant of left female breast: Secondary | ICD-10-CM | POA: Diagnosis not present

## 2017-01-29 DIAGNOSIS — Z803 Family history of malignant neoplasm of breast: Secondary | ICD-10-CM | POA: Diagnosis not present

## 2017-01-29 DIAGNOSIS — Z9221 Personal history of antineoplastic chemotherapy: Secondary | ICD-10-CM | POA: Diagnosis not present

## 2017-01-29 DIAGNOSIS — Z170 Estrogen receptor positive status [ER+]: Secondary | ICD-10-CM | POA: Diagnosis not present

## 2017-01-29 DIAGNOSIS — Z9011 Acquired absence of right breast and nipple: Secondary | ICD-10-CM | POA: Diagnosis not present

## 2017-01-29 MED ORDER — SONAFINE EX EMUL: 1.0000 "application " | Freq: Two times a day (BID) | CUTANEOUS | Status: DC

## 2017-01-30 ENCOUNTER — Ambulatory Visit
Admission: RE | Admit: 2017-01-30 | Discharge: 2017-01-30 | Disposition: A | Discharge status: Home / Self Care | Payer: Medicare Other | Source: Ambulatory Visit | Attending: Radiation Oncology | Admitting: Radiation Oncology

## 2017-01-30 ENCOUNTER — Ambulatory Visit: Payer: Medicare Other

## 2017-01-30 DIAGNOSIS — Z170 Estrogen receptor positive status [ER+]: Secondary | ICD-10-CM | POA: Diagnosis not present

## 2017-01-30 DIAGNOSIS — Z510 Encounter for antineoplastic radiation therapy: Secondary | ICD-10-CM | POA: Diagnosis not present

## 2017-01-30 DIAGNOSIS — Z803 Family history of malignant neoplasm of breast: Secondary | ICD-10-CM | POA: Diagnosis not present

## 2017-01-30 DIAGNOSIS — Z9221 Personal history of antineoplastic chemotherapy: Secondary | ICD-10-CM | POA: Diagnosis not present

## 2017-01-30 DIAGNOSIS — Z9011 Acquired absence of right breast and nipple: Secondary | ICD-10-CM | POA: Diagnosis not present

## 2017-01-30 DIAGNOSIS — C50412 Malignant neoplasm of upper-outer quadrant of left female breast: Secondary | ICD-10-CM | POA: Diagnosis not present

## 2017-01-31 ENCOUNTER — Ambulatory Visit
Admission: RE | Admit: 2017-01-31 | Discharge: 2017-01-31 | Disposition: A | Discharge status: Home / Self Care | Payer: Medicare Other | Source: Ambulatory Visit | Attending: Radiation Oncology | Admitting: Radiation Oncology

## 2017-01-31 ENCOUNTER — Ambulatory Visit: Admission: RE | Admit: 2017-01-31 | Payer: Medicare Other | Source: Ambulatory Visit

## 2017-01-31 ENCOUNTER — Ambulatory Visit: Payer: Medicare Other

## 2017-01-31 DIAGNOSIS — Z853 Personal history of malignant neoplasm of breast: Secondary | ICD-10-CM

## 2017-02-03 ENCOUNTER — Ambulatory Visit: Payer: Medicare Other

## 2017-02-04 ENCOUNTER — Ambulatory Visit: Payer: Medicare Other

## 2017-02-05 ENCOUNTER — Ambulatory Visit: Payer: Medicare Other

## 2017-02-05 ENCOUNTER — Ambulatory Visit
Admission: RE | Admit: 2017-02-05 | Discharge: 2017-02-05 | Disposition: A | Discharge status: Home / Self Care | Payer: Medicare Other | Source: Ambulatory Visit | Attending: Radiation Oncology | Admitting: Radiation Oncology

## 2017-02-05 DIAGNOSIS — Z170 Estrogen receptor positive status [ER+]: Secondary | ICD-10-CM | POA: Diagnosis not present

## 2017-02-05 DIAGNOSIS — Z510 Encounter for antineoplastic radiation therapy: Secondary | ICD-10-CM | POA: Diagnosis not present

## 2017-02-05 DIAGNOSIS — Z9221 Personal history of antineoplastic chemotherapy: Secondary | ICD-10-CM | POA: Diagnosis not present

## 2017-02-05 DIAGNOSIS — C50412 Malignant neoplasm of upper-outer quadrant of left female breast: Secondary | ICD-10-CM | POA: Diagnosis not present

## 2017-02-05 DIAGNOSIS — Z803 Family history of malignant neoplasm of breast: Secondary | ICD-10-CM | POA: Diagnosis not present

## 2017-02-05 DIAGNOSIS — Z9011 Acquired absence of right breast and nipple: Secondary | ICD-10-CM | POA: Diagnosis not present

## 2017-02-06 ENCOUNTER — Ambulatory Visit: Payer: Medicare Other

## 2017-02-07 ENCOUNTER — Ambulatory Visit
Admission: RE | Admit: 2017-02-07 | Discharge: 2017-02-07 | Disposition: A | Discharge status: Home / Self Care | Payer: Medicare Other | Source: Ambulatory Visit | Attending: Radiation Oncology | Admitting: Radiation Oncology

## 2017-02-07 ENCOUNTER — Ambulatory Visit: Payer: Medicare Other

## 2017-02-07 DIAGNOSIS — Z803 Family history of malignant neoplasm of breast: Secondary | ICD-10-CM | POA: Diagnosis not present

## 2017-02-07 DIAGNOSIS — Z170 Estrogen receptor positive status [ER+]: Secondary | ICD-10-CM | POA: Diagnosis not present

## 2017-02-07 DIAGNOSIS — Z9011 Acquired absence of right breast and nipple: Secondary | ICD-10-CM | POA: Diagnosis not present

## 2017-02-07 DIAGNOSIS — C50412 Malignant neoplasm of upper-outer quadrant of left female breast: Secondary | ICD-10-CM | POA: Diagnosis not present

## 2017-02-07 DIAGNOSIS — Z9221 Personal history of antineoplastic chemotherapy: Secondary | ICD-10-CM | POA: Diagnosis not present

## 2017-02-07 DIAGNOSIS — Z510 Encounter for antineoplastic radiation therapy: Secondary | ICD-10-CM | POA: Diagnosis not present

## 2017-02-10 ENCOUNTER — Ambulatory Visit: Payer: Medicare Other

## 2017-02-10 ENCOUNTER — Ambulatory Visit
Admission: RE | Admit: 2017-02-10 | Discharge: 2017-02-10 | Disposition: A | Discharge status: Home / Self Care | Payer: Medicare Other | Source: Ambulatory Visit | Attending: Radiation Oncology | Admitting: Radiation Oncology

## 2017-02-10 DIAGNOSIS — Z9221 Personal history of antineoplastic chemotherapy: Secondary | ICD-10-CM | POA: Diagnosis not present

## 2017-02-10 DIAGNOSIS — Z9011 Acquired absence of right breast and nipple: Secondary | ICD-10-CM | POA: Diagnosis not present

## 2017-02-10 DIAGNOSIS — Z803 Family history of malignant neoplasm of breast: Secondary | ICD-10-CM | POA: Diagnosis not present

## 2017-02-10 DIAGNOSIS — C50412 Malignant neoplasm of upper-outer quadrant of left female breast: Secondary | ICD-10-CM | POA: Diagnosis not present

## 2017-02-10 DIAGNOSIS — Z170 Estrogen receptor positive status [ER+]: Secondary | ICD-10-CM | POA: Diagnosis not present

## 2017-02-10 DIAGNOSIS — Z510 Encounter for antineoplastic radiation therapy: Secondary | ICD-10-CM | POA: Diagnosis not present

## 2017-02-11 ENCOUNTER — Ambulatory Visit: Payer: Medicare Other

## 2017-02-12 ENCOUNTER — Telehealth: Payer: Self-pay | Admitting: *Deleted

## 2017-02-12 ENCOUNTER — Ambulatory Visit: Payer: Medicare Other

## 2017-02-12 ENCOUNTER — Ambulatory Visit
Admission: RE | Admit: 2017-02-12 | Discharge: 2017-02-12 | Disposition: A | Discharge status: Home / Self Care | Payer: Medicare Other | Source: Ambulatory Visit | Attending: Radiation Oncology | Admitting: Radiation Oncology

## 2017-02-12 DIAGNOSIS — I10 Essential (primary) hypertension: Secondary | ICD-10-CM | POA: Diagnosis not present

## 2017-02-12 DIAGNOSIS — Z85850 Personal history of malignant neoplasm of thyroid: Secondary | ICD-10-CM | POA: Diagnosis not present

## 2017-02-12 DIAGNOSIS — Z9221 Personal history of antineoplastic chemotherapy: Secondary | ICD-10-CM | POA: Diagnosis not present

## 2017-02-12 DIAGNOSIS — Z9011 Acquired absence of right breast and nipple: Secondary | ICD-10-CM | POA: Diagnosis not present

## 2017-02-12 DIAGNOSIS — E785 Hyperlipidemia, unspecified: Secondary | ICD-10-CM | POA: Diagnosis not present

## 2017-02-12 DIAGNOSIS — E890 Postprocedural hypothyroidism: Secondary | ICD-10-CM | POA: Diagnosis not present

## 2017-02-12 DIAGNOSIS — C50412 Malignant neoplasm of upper-outer quadrant of left female breast: Secondary | ICD-10-CM | POA: Diagnosis not present

## 2017-02-12 DIAGNOSIS — Z170 Estrogen receptor positive status [ER+]: Secondary | ICD-10-CM | POA: Diagnosis not present

## 2017-02-12 DIAGNOSIS — Z510 Encounter for antineoplastic radiation therapy: Secondary | ICD-10-CM | POA: Diagnosis not present

## 2017-02-12 DIAGNOSIS — Z803 Family history of malignant neoplasm of breast: Secondary | ICD-10-CM | POA: Diagnosis not present

## 2017-02-12 DIAGNOSIS — L409 Psoriasis, unspecified: Secondary | ICD-10-CM | POA: Diagnosis not present

## 2017-02-12 NOTE — Telephone Encounter (Signed)
Called and left vm on patient phone, she was a no show for rad tx today, only has 3 more txs, was informed that she didn't want to complete the rest, asked that she call us at [phone] or [phone] ,  3:08 PM

## 2017-02-13 ENCOUNTER — Ambulatory Visit
Admission: RE | Admit: 2017-02-13 | Discharge: 2017-02-13 | Disposition: A | Discharge status: Home / Self Care | Payer: Medicare Other | Source: Ambulatory Visit | Attending: Radiation Oncology | Admitting: Radiation Oncology

## 2017-02-13 ENCOUNTER — Ambulatory Visit: Payer: Medicare Other

## 2017-02-13 DIAGNOSIS — Z9011 Acquired absence of right breast and nipple: Secondary | ICD-10-CM | POA: Diagnosis not present

## 2017-02-13 DIAGNOSIS — C50412 Malignant neoplasm of upper-outer quadrant of left female breast: Secondary | ICD-10-CM | POA: Diagnosis not present

## 2017-02-13 DIAGNOSIS — Z170 Estrogen receptor positive status [ER+]: Secondary | ICD-10-CM | POA: Diagnosis not present

## 2017-02-13 DIAGNOSIS — Z510 Encounter for antineoplastic radiation therapy: Secondary | ICD-10-CM | POA: Diagnosis not present

## 2017-02-13 DIAGNOSIS — Z803 Family history of malignant neoplasm of breast: Secondary | ICD-10-CM | POA: Diagnosis not present

## 2017-02-13 DIAGNOSIS — Z9221 Personal history of antineoplastic chemotherapy: Secondary | ICD-10-CM | POA: Diagnosis not present

## 2017-02-14 ENCOUNTER — Ambulatory Visit: Payer: Medicare Other

## 2017-02-18 NOTE — Progress Notes (Addendum)
. [name]   DOB: [birth year]  MR#: [id number]  CSN#:[id number]  PCP: [name] Anger, MD Patient Care Team: [name] Anger, MD as PCP - General [name], [name] Dad, MD as Consulting Physician (Oncology) [name] [name], MD as Consulting Physician (General Surgery) [name] Campbell, MD as Consulting Physician (Gastroenterology) [name] Rudd, MD as Consulting Physician (Radiation Oncology)   CHIEF COMPLAINT: Weakly estrogen receptor positive breast cancer  CURRENT TREATMENT: To start anastrozole 03/14/2017   INTERVAL HISTORY: [name] [name] returns today for follow-up and treatment of her weakly estrogen receptor positive breast cancer.  She was scheduled to complete radiation last week, but the patient called on 02/12/2017 and reported she did not want any further treatments. She notes that she has a rash and redness on her left breast from the radiation. She also felt a burning sensation. She uses hydrocortisone to aid this. She reports that she experience nausea and fatigue during the treatments.   Since her last visit, she underwent diagnostic unilateral left breast mammography with CAD and tomography on 12/27/2016 at [facility] showing: breast density category B. There was no evidence of new breast malignancy. Expected post surgical changed in the lower inner aspect of the left breast.   REVIEW OF SYSTEMS: [name] [name] reports that her dog, who is 70.49 years old, was sick with a UTI infection. She is thankful that the dog is doing better and did not have to be put down. She reports that she goes out to eat and shopping with her sister. She also goes to eat and walk with her husband. She denies unusual headaches, visual changes, nausea, vomiting, or dizziness. There has been no unusual cough, phlegm production, or pleurisy. This been no change in bowel or bladder habits. She denies unexplained fatigue or unexplained weight loss, bleeding, rash, or fever. A detailed review of systems  was otherwise stable.  BREAST CANCER HISTORY: From the recent summary note:  I last saw [name] [name] in 2013 when she was released from follow-up from her right breast cancer recurrence in 2004. She is status post right mastectomy and adjuvant chemotherapy for that triple negative tumor. More recently, on 07/16/2016 she underwent left screening mammography at the [facility] on 07/16/2016. This found the breast density to be category B. There was a possible asymmetry in the left breast, and on 07/19/2016 she underwent left diagnostic mammography with tomography and left breast ultrasonography. This confirmed a well circumscribed oval mass at the 9:00 position of the left breast measuring approximately 0.8 cm. This was not palpable. On ultrasonography the mass measured 0.6 cm and it was located at the 9:00 radiant 4 cm from the nipple. The left axilla was sonographically benign.  On June 11 [name] [name] underwent biopsy of the left breast mass in question and this showed (SAA [phone]) and invasive ductal carcinoma, grade 3, estrogen receptor 5% positive with weak staining intensity, progesterone receptor negative, with an MIB-1 of 80%, and HER-2 nonamplified, the signals ratio being 1.46 and the number per cell 1.90.  Her subsequent history is as detailed below.   PAST MEDICAL HISTORY: Past Medical History:  Diagnosis Date  . Allergic rhinitis   . Anxiety   . Breast cancer ([place]) hx 2004   recurrent 2006 [name] [name]  . Family history of breast cancer   . 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 HTN (hypertension)   . Hyperlipidemia   . Hypothyroidism   . Personal  history of chemotherapy 2002  . Personal history of radiation therapy 2002  . Psoriasis   . S/P thyroidectomy 07/2005   2 cm largest diameter (1 other tiny focus)/ i-131 rx 99 mci 08/2005  . Thyroid cancer ([place])    Papillary Stage 1 - [name] [name]  . Vitamin B12  deficiency 2009  . Vitamin D deficiency 2009    PAST SURGICAL HISTORY: Past Surgical History:  Procedure Laterality Date  . BREAST LUMPECTOMY    . BREAST LUMPECTOMY WITH RADIOACTIVE SEED AND SENTINEL LYMPH NODE BIOPSY Left 09/11/2016   Procedure: LEFT BREAST LUMPECTOMY WITH RADIOACTIVE SEED AND LEFT AXILLARY SENTINEL LYMPH NODE BIOPSY WITH BLUE DYE INJECTION;  Surgeon: [name] [name], MD;  Location: [facility];  Service: General;  Laterality: Left;  . IR FLUORO GUIDE PORT INSERTION RIGHT  09/13/2016  . IR REMOVAL TUN ACCESS W/ PORT W/O FL MOD SED  12/30/2016  . IR US GUIDE VASC ACCESS RIGHT  09/13/2016  . MASTECTOMY     Right  . PORTACATH PLACEMENT N/A 09/11/2016   Procedure: ATTEMPTED INSERTION PORT-A-CATH WITH ULTRA SOUND GUIDANCE;  Surgeon: [name] [name], MD;  Location: [facility];  Service: General;  Laterality: N/A;  . REDUCTION MAMMAPLASTY Left 2005  . THYROIDECTOMY  2007    FAMILY HISTORY Family History  Problem Relation Age of Onset  . Stroke Mother   . Allergies Mother   . Asthma Mother   . Clotting disorder Mother   . Heart disease Father 32       MI  . Allergies Sister   . Asthma Sister   . Breast cancer Sister 85       Lymphoma 17; currently 22  . Asthma Brother   . Leukemia Brother        dx 87s  . Allergies Daughter   . Asthma Daughter   . Allergies Sister   . Breast cancer Sister 73       currently 70  . Cancer Paternal Aunt        kidney ca; deceased 54  . Cancer Paternal Uncle        unk. primary ("liver")  . Throat cancer Maternal Grandmother        deceased 39  . Lung cancer Maternal Grandfather        deceased 31  . Pancreatic cancer Paternal Grandfather        deceased 58  . Cancer Paternal Aunt        "abdominal"; deceased 49  Family history includes a sister, a paternal aunt, and a paternal cousin with breast cancer all older then 28 at diagnosis  GYN HISTORY: Menarche age 28, first live birth age 65. She is GX  P3. She went through the change of life age 57. She did not take hormone replacement.  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 Her husband [name] [name] is retired from [employer]. Her children are [name], who works for time [employer] in [place], and [place], who lives in [place] and works for [employer]. The third child, [name], died in an automobile accident at age 54. The patient has one grandchild.  Her dog Baxter Hire is a chance but still kicking.  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 Smoking status: Never Smoker  . Smokeless tobacco: Never Used  Substance Use Topics  . Alcohol use: No  . Drug  use: No     Colonoscopy:  PAP:  Bone density:  Lipid panel:  Allergies  Allergen Reactions  . Pneumococcal Vaccines Other (See Comments)    Was really sick   . Sulfa Antibiotics Other (See Comments)    Pt believes it was hives  . Sulfacetamide Sodium-Sulfur   . Tape Itching, Dermatitis and Rash  . Tegaderm Ag Mesh [Silver] Itching and Rash    Current Outpatient Medications  Medication Sig Dispense Refill  . ALPRAZolam (XANAX) 0.25 MG tablet Take 0.125-0.25 mg by mouth 2 (two) times daily as needed for anxiety or sleep (Pt takes a half of a tablet every evening).     . cholecalciferol (VITAMIN D) 1000 units tablet Take 1,000 Units by mouth daily.    . DULoxetine (CYMBALTA) 60 MG capsule Take 60 mg by mouth every morning.     . hyaluronate sodium (RADIAPLEXRX) GEL Apply 1 application topically 2 (two) times daily. Apply  To affected breast after rad tx and bedtime daily,nothing 4 hours prior to rad txs    . levothyroxine (SYNTHROID, LEVOTHROID) 112 MCG tablet TAKE 1 TABLET (112 MCG TOTAL) BY MOUTH DAILY. 90 tablet 3  . lidocaine-prilocaine (EMLA) cream Apply to affected area once 30 g 3  . losartan (COZAAR) 100 MG tablet TAKE 1 TABLET BY MOUTH EVERY DAY FOR BLOOD PRESSURE 90  tablet 1  . non-metallic deodorant (ALRA) MISC Apply 1 application topically. Apply after rad tx and as needed, nothing 4 hours prior to rad tx    . rosuvastatin (CRESTOR) 20 MG tablet Take 1 tablet (20 mg total) by mouth daily. 90 tablet 2  . venlafaxine XR (EFFEXOR-XR) 75 MG 24 hr capsule Take 1 capsule (75 mg total) daily with breakfast by mouth. 30 capsule 6  . vitamin B-12 (CYANOCOBALAMIN) 1000 MCG tablet Take 1,000 mcg by mouth every other day.    . Wound Dressings (SONAFINE EX) Apply 1 application topically 2 (two) times daily.     No current facility-administered medications for this visit.     OBJECTIVE: Older white woman in no acute distress  Vitals:   02/19/17 1353  BP: (!) 173/99  Pulse: (!) 107  Resp: 14  Temp: 98.2 F (36.8 C)  SpO2: 100%     Body mass index is 23.69 kg/m.    ECOG FS: 1  Sclerae unicteric, EOMs intact Oropharynx clear and moist No cervical or supraclavicular adenopathy Lungs no rales or rhonchi Heart regular rate and rhythm Abd soft, nontender, positive bowel sounds MSK no focal spinal tenderness, no upper extremity lymphedema Neuro: nonfocal, well oriented, appropriate affect Breasts: The right breast is status post lumpectomy and remote radiation.  There is no evidence of local recurrence.  The left breast is status post lumpectomy and recent radiation.  There is erythema, but no desquamation.  There is no evidence of local recurrence or residual disease.  Both axillae are benign  LAB RESULTS: Lab Results  Component Value Date   WBC 5.7 12/30/2016   NEUTROABS 3.0 12/30/2016   HGB 11.1 (L) 12/30/2016   HCT 34.0 (L) 12/30/2016   MCV 104.6 (H) 12/30/2016   PLT 391 12/30/2016      Chemistry      Component Value Date/Time   NA 139 12/30/2016 1157   NA 139 12/11/2016 1017   K 4.3 12/30/2016 1157   K 3.7 12/11/2016 1017   CL 105 12/30/2016 1157   CL 104 10/09/2011 1403   CO2 26 12/30/2016 1157   CO2  22 12/11/2016 1017   BUN 14  12/30/2016 1157   BUN 16.9 12/11/2016 1017   CREATININE 1.11 (H) 12/30/2016 1157   CREATININE 1.2 (H) 12/11/2016 1017      Component Value Date/Time   CALCIUM 9.3 12/30/2016 1157   CALCIUM 9.0 12/11/2016 1017   ALKPHOS 139 12/11/2016 1017   AST 65 (H) 12/11/2016 1017   ALT 51 12/11/2016 1017   BILITOT 0.42 12/11/2016 1017       Lab Results  Component Value Date   LABCA2 16 10/09/2011    No components found for: [id number]  No results for input(s): INR in the last 168 hours.  Urinalysis    Component Value Date/Time   COLORURINE YELLOW 06/05/2015 1559   APPEARANCEUR CLEAR 06/05/2015 1559   LABSPEC 1.020 06/05/2015 1559   PHURINE 5.5 06/05/2015 1559   GLUCOSEU NEGATIVE 06/05/2015 1559   HGBUR NEGATIVE 06/05/2015 1559   BILIRUBINUR NEGATIVE 06/05/2015 1559   KETONESUR NEGATIVE 06/05/2015 1559   UROBILINOGEN 0.2 06/05/2015 1559   NITRITE NEGATIVE 06/05/2015 1559   LEUKOCYTESUR MODERATE (A) 06/05/2015 1559    STUDIES: Since her last visit, she underwent diagnostic unilateral left breast mammography with CAD and tomography on 12/27/2016 at [facility] showing: breast density category B. There was no evidence of new breast malignancy. Expected post surgical changed in the lower inner aspect of the left breast.   ASSESSMENT: 77 y.o. [place] woman   (1) status post right lumpectomy and sentinel lymph node biopsy in September 2002 for multifocal breast carcinoma, triple negative.  Treated with CMF followed by radiation.   (2) Local recurrence in April 2004, status post right modified radical mastectomy with TRAM reconstruction for what proved to be a T1c N0, stage IA triple negative breast carcinoma.  Treated adjuvantly with paclitaxel and doxorubicin x4 in 2004.  Off treatment since August 2004 with no evidence of recurrence  (3) history of papillary thyroid cancer s/p thyroidectomy June 2007, s/p radioactive iodine July 2007  (4) status post left breast upper outer  quadrant biopsy 08/21/2016 for a clinical T1b N0, stage IB invasive ductal carcinoma, grade 3, weakly estrogen receptor positive, progesterone receptor and HER-2 negative, with an MIB-1 of 80%.  (5) left lumpectomy and sentinel lymph node sampling 09/11/2016 found a pT1b pN0, stage IB invasive ductal carcinoma, grade 3, with negative margins.  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*)  (a) there were no deleterious mutations in ALK, APC, ATM, AXIN2, BAP1, BARD1, BLM, BMPR1A, BRCA1, BRCA2, BRIP1, CASR, CDC73, CDH1, CDK4, CDKN1B, CDKN1C, CDKN2A, CEBPA, CHEK2, CTNNA1, DICER1, DIS3L2, EGFR, EPCAM, FH, FLCN, GATA2, GPC3, GREM1, HOXB13, HRAS, KIT, MAX, MEN1, MET, MITF, MLH1, MSH2, MSH3, MSH6, MUTYH, NBN, NF1, NF2, NTHL1, PALB2, PDGFRA, PHOX2B, PMS2, POLD1, POLE, POT1, PRKAR1A, PTCH1, PTEN, RAD50, RAD51C, RAD51D, RB1, RECQL4, RET, RUNX1, SDHA, SDHAF2, SDHB, SDHC, SDHD, SMAD4, SMARCA4, SMARCB1, SMARCE1, STK11, SUFU, TERC, TERT, TMEM127, TP53, TSC1, TSC2, VHL, WRN, WT1  (b) while homozygous mutations in the NPH L1 gene are associated with autosomal recessive polyposis, there is no evidence to suggest that a single mutation in this gene increases cancer risk.  (9) to  start physical 03/15/2027  (a) DEXA scan scheduled for February at the breast center   PLAN:  Porchia has completed her local treatment and is now ready to consider antiestrogens. We discussed the difference between tamoxifen and anastrozole in detail. She understands that anastrozole and the aromatase inhibitors in general work by blocking  estrogen production. Accordingly vaginal dryness, decrease in bone density, and of course hot flashes can result. The aromatase inhibitors can also negatively affect the cholesterol profile, although that is a minor effect. One out of 5 women on aromatase inhibitors we will feel "old and achy". This arthralgia/myalgia syndrome, which resembles fibromyalgia clinically, does resolve with stopping the medications. Accordingly this is not a reason to not try an aromatase inhibitor but it is a frequent reason to stop it (in other words 20% of women will not be able to tolerate these medications).  Tamoxifen on the other hand does not block estrogen production. It does not "take away a woman's estrogen". It blocks the estrogen receptor in breast cells. Like anastrozole, it can also cause hot flashes. As opposed to anastrozole, tamoxifen has many estrogen-like effects. It is technically an estrogen receptor modulator. This means that in some tissues tamoxifen works like estrogen-- for example it helps strengthen the bones. It tends to improve the cholesterol profile. It can cause thickening of the endometrial lining, and even endometrial polyps or rarely cancer of the uterus.(The risk of uterine cancer due to tamoxifen is one additional cancer per thousand women year). It can cause vaginal wetness or stickiness. It can cause blood clots through this estrogen-like effect--the risk of blood clots with tamoxifen is exactly the same as with birth control pills or hormone replacement.  Neither of these agents causes mood changes or weight gain, despite the popular belief that they can have these side effects. We have data from studies comparing either of these drugs with placebo, and in those cases the control group had the same amount of weight gain and depression as the group that took the drug.  After this discussion she would prefer to start anastrozole, and we are going to wait until 03/14/2017 because she is still  recovering from her radiation treatments.  She will let me know if she has any unusual or early side.  If so we can consider switching to tamoxifen  If she tolerates anastrozole well the plan will be to continue that a total of 5 years.  Of course we will have a very low threshold to discontinue the medication given the fact that her tumor was not strongly estrogen receptor positive.  I am setting her up for a bone density to serve as baseline.  Hopefully we will have those results before she returns to see me mid March  She knows to call for any other issues that may develop before then.    [name], [name] Dad, MD  02/19/17 2:14 PM Medical Oncology and Hematology [facility] [name] [place], [place] [address] Tel. [phone]    Fax. [phone]   This document serves as a record of services personally performed by Lurline Del, MD. It was created on his behalf by Sheron Nightingale, a trained medical scribe. The creation of this record is based on the scribe's personal observations and the provider's statements to them.   I have reviewed the above documentation for accuracy and completeness, and I agree with the above.   Addendum: We repeated PEG his blood pressure and the diastolic remains in the 267  range.  I am going to give her a dose of Lasix and we will see if she can be worked in at her primary care physician's office across the street later today

## 2017-02-19 ENCOUNTER — Inpatient Hospital Stay: Payer: Medicare Other | Attending: Oncology | Admitting: Oncology

## 2017-02-19 ENCOUNTER — Ambulatory Visit (INDEPENDENT_AMBULATORY_CARE_PROVIDER_SITE_OTHER): Payer: Medicare Other | Admitting: Nurse Practitioner

## 2017-02-19 ENCOUNTER — Telehealth: Payer: Self-pay | Admitting: Oncology

## 2017-02-19 ENCOUNTER — Encounter: Payer: Self-pay | Admitting: Nurse Practitioner

## 2017-02-19 VITALS — BP 150/98 | HR 92 | Temp 97.6°F | Resp 16 | Ht 64.0 in

## 2017-02-19 VITALS — BP 173/99 | HR 107 | Temp 98.2°F | Resp 14 | Ht 64.0 in | Wt 138.0 lb

## 2017-02-19 DIAGNOSIS — C50412 Malignant neoplasm of upper-outer quadrant of left female breast: Secondary | ICD-10-CM | POA: Diagnosis not present

## 2017-02-19 DIAGNOSIS — R5383 Other fatigue: Secondary | ICD-10-CM | POA: Diagnosis not present

## 2017-02-19 DIAGNOSIS — Z9221 Personal history of antineoplastic chemotherapy: Secondary | ICD-10-CM | POA: Diagnosis not present

## 2017-02-19 DIAGNOSIS — Z800 Family history of malignant neoplasm of digestive organs: Secondary | ICD-10-CM

## 2017-02-19 DIAGNOSIS — M8580 Other specified disorders of bone density and structure, unspecified site: Secondary | ICD-10-CM | POA: Insufficient documentation

## 2017-02-19 DIAGNOSIS — E039 Hypothyroidism, unspecified: Secondary | ICD-10-CM | POA: Diagnosis not present

## 2017-02-19 DIAGNOSIS — Z170 Estrogen receptor positive status [ER+]: Secondary | ICD-10-CM | POA: Diagnosis not present

## 2017-02-19 DIAGNOSIS — Z853 Personal history of malignant neoplasm of breast: Secondary | ICD-10-CM

## 2017-02-19 DIAGNOSIS — F419 Anxiety disorder, unspecified: Secondary | ICD-10-CM | POA: Diagnosis not present

## 2017-02-19 DIAGNOSIS — Z801 Family history of malignant neoplasm of trachea, bronchus and lung: Secondary | ICD-10-CM | POA: Diagnosis not present

## 2017-02-19 DIAGNOSIS — R110 Nausea: Secondary | ICD-10-CM

## 2017-02-19 DIAGNOSIS — E785 Hyperlipidemia, unspecified: Secondary | ICD-10-CM

## 2017-02-19 DIAGNOSIS — Z9011 Acquired absence of right breast and nipple: Secondary | ICD-10-CM

## 2017-02-19 DIAGNOSIS — R21 Rash and other nonspecific skin eruption: Secondary | ICD-10-CM

## 2017-02-19 DIAGNOSIS — I10 Essential (primary) hypertension: Secondary | ICD-10-CM

## 2017-02-19 DIAGNOSIS — Z9223 Personal history of estrogen therapy: Secondary | ICD-10-CM

## 2017-02-19 DIAGNOSIS — Z85850 Personal history of malignant neoplasm of thyroid: Secondary | ICD-10-CM

## 2017-02-19 DIAGNOSIS — R419 Unspecified symptoms and signs involving cognitive functions and awareness: Secondary | ICD-10-CM | POA: Insufficient documentation

## 2017-02-19 DIAGNOSIS — E539 Vitamin B deficiency, unspecified: Secondary | ICD-10-CM | POA: Diagnosis not present

## 2017-02-19 DIAGNOSIS — D559 Anemia due to enzyme disorder, unspecified: Secondary | ICD-10-CM

## 2017-02-19 DIAGNOSIS — Z79899 Other long term (current) drug therapy: Secondary | ICD-10-CM

## 2017-02-19 MED ORDER — AMLODIPINE BESYLATE 5 MG PO TABS: 5.0000 mg | ORAL_TABLET | Freq: Every day | ORAL | 3 refills | Status: DC

## 2017-02-19 MED ORDER — FUROSEMIDE 20 MG PO TABS
20.0000 mg | ORAL_TABLET | ORAL | Status: AC
  Administered 2017-02-19: 20 mg via ORAL

## 2017-02-19 NOTE — Assessment & Plan Note (Signed)
2 separate manual BP readings in office are consistent-150/94, 150/98 I suspect there could have been a malfunction of the automatic blood pressure cuff at prior appointment, or blood pressure has now decreased. History of past non-compliance but patient is persistent that she has been taking her losartan 100 daily since at least November We discussed addition of second blood pressure medication and she is agreeable. Diagnostic testing ordered: - EKG 12-Lead- I have reviewed the patient's EKG; normal reading with no acute changes Medications ordered: - amLODipine (NORVASC) 5 MG tablet; Take 1 tablet (5 mg total) by mouth daily.  Dispense: 90 tablet; Refill: 3 Strict follow up precautions given. She will RTC in 2 weeks for follow up, or sooner if needed

## 2017-02-19 NOTE — Telephone Encounter (Signed)
Gave patient avs and calendar with appts per 1/9 los.  °

## 2017-02-19 NOTE — Progress Notes (Signed)
Subjective:    Patient ID: [name] [name], female    DOB: 07-14-1940, 77 y.o.   MRN: [id number]  HPI [name] [name] presents today for an acute visit.  She was sent to the office from an oncology appointment today for elevated blood pressure readings of 199/85? On her right arm and left leg in their clinic. The blood pressures were checked with an automatic blood pressure machine.  She says she did wake this morning with a dull headache and mild nausea, which have been constant throughout the day, but she otherwise feels okay. She is maintained on losartan (cozaar) 100 daily for blood pressure. On chart review, she has had some issues with compliance in the past, but does report today that she takes the medication daily as scheduled. Chart review also shows that she was last given a 90 day prescription for the medication with 1 90 day refill in May, which means she should have run out in November, but she assures me she has been taking this medication for at least the last 2 months. She made a point to put the medication on a dresser in her home that is visible so she would remember to take it daily. She called her husband during our visit who confirmed that losartan is the medication on her dresser that she has been taking daily. She thinks that maybe she missed some doses earlier this year and that is why she still has medication left due to lack of refills. Her current bottle was filled on December 14, 2016 for a 90 day supply per her husband, and she assures me she has been taking the medication daily since she got this refill.  She denies syncope, confusion, dizziness, weakness, blurred vision, chest pain, shortness of breath, nausea, vomiting. She has an automatic wrist blood pressure cuff at home but hasn't checked her blood pressure recently - she is not sure if her blood pressure cuff is accurate.  BP Readings from Last 3 Encounters:  02/19/17 (!) 150/94  02/19/17 (!) 173/99  12/30/16 136/87    Review of Systems  See HPI  Past Medical History:  Diagnosis Date  . Allergic rhinitis   . Anxiety   . Breast cancer ([place]) hx 2004   recurrent 2006 DR Magrinat  . Family history of breast cancer   . Genetic testing 12/05/2016   Multi-Cancer panel (83 genes) @ [facility] - Monoallelic mutation in [place] (carrier)  . HTN (hypertension)   . Hyperlipidemia   . Hypothyroidism   . Personal history of chemotherapy 2002  . Personal history of radiation therapy 2002  . Psoriasis   . S/P thyroidectomy 07/2005   2 cm largest diameter (1 other tiny focus)/ i-131 rx 99 [facility] 08/2005  . Thyroid cancer ([place])    Papillary Stage 1 - Dr Loanne Drilling  . Vitamin B12 deficiency 2009  . Vitamin D deficiency 2009     Social History   Socioeconomic History  . Marital status: Married    Spouse name: Not on file  . Number of children: 2  . Years of education: Not on file  . Highest education level: Not on file  Social Needs  . Financial resource strain: Not on file  . Food insecurity - worry: Not on file  . Food insecurity - inability: Not on file  . Transportation needs - medical: Not on file  . Transportation needs - non-medical: Not on file  Occupational History  . 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 Smoking status: Never Smoker  . Smokeless tobacco: Never Used  Substance and Sexual Activity  . Alcohol use: No  . Drug use: No  . Sexual activity: Not Currently  Other Topics Concern  . Not on file  Social History Narrative   GI - Dr Earlean Shawl   Depression - Dr Toy Care   GYN - Dr Marianna Payment       Past Surgical History:  Procedure Laterality Date  . BREAST LUMPECTOMY    . BREAST LUMPECTOMY WITH RADIOACTIVE SEED AND SENTINEL LYMPH NODE BIOPSY Left 09/11/2016   Procedure: LEFT BREAST LUMPECTOMY WITH RADIOACTIVE SEED AND LEFT AXILLARY SENTINEL LYMPH NODE BIOPSY WITH BLUE DYE INJECTION;  Surgeon: [name], MD;  Location: [facility];  Service: General;  Laterality: Left;  . IR FLUORO GUIDE PORT INSERTION RIGHT  09/13/2016  . IR REMOVAL TUN ACCESS W/ PORT W/O FL MOD SED  12/30/2016  . IR US GUIDE VASC ACCESS RIGHT  09/13/2016  . MASTECTOMY     Right  . PORTACATH PLACEMENT N/A 09/11/2016   Procedure: ATTEMPTED INSERTION PORT-A-CATH WITH ULTRA SOUND GUIDANCE;  Surgeon: [name], MD;  Location: [facility];  Service: General;  Laterality: N/A;  . REDUCTION MAMMAPLASTY Left 2005  . THYROIDECTOMY  2007    Family History  Problem Relation Age of Onset  . Stroke Mother   . Allergies Mother   . Asthma Mother   . Clotting disorder Mother   . Heart disease Father 7       MI  . Allergies Sister   . Asthma Sister   . Breast cancer Sister 33       Lymphoma 34; currently 97  . Asthma Brother   . Leukemia Brother        dx 76s  . Allergies Daughter   . Asthma Daughter   . Allergies Sister   . Breast cancer Sister 26       currently 82  . Cancer Paternal Aunt        kidney ca; deceased 36  . Cancer Paternal Uncle        unk. primary ("liver")  . Throat cancer Maternal Grandmother        deceased 30  . Lung cancer Maternal Grandfather        deceased 55  . Pancreatic cancer Paternal Grandfather        deceased 15  . Cancer Paternal Aunt        "abdominal"; deceased 53    Allergies  Allergen Reactions  . Pneumococcal Vaccines Other (See Comments)    Was really sick   . Sulfa Antibiotics Other (See Comments)    Pt believes it was hives  . Sulfacetamide Sodium-Sulfur   . Tape Itching, Dermatitis and Rash  . Tegaderm Ag Mesh [Silver] Itching and Rash    Current Outpatient Medications on File Prior to Visit  Medication Sig Dispense Refill  . ALPRAZolam (XANAX) 0.25 MG tablet Take 0.125-0.25 mg by mouth 2 (two) times daily as needed for anxiety or sleep (Pt takes a half of a tablet every evening).     . cholecalciferol (VITAMIN D) 1000 units tablet Take 1,000 Units by mouth  daily.    . DULoxetine (CYMBALTA) 60 MG capsule Take 60 mg by mouth every morning.     . hyaluronate sodium (RADIAPLEXRX) GEL Apply 1 application topically 2 (two) times daily. Apply  To affected  breast after rad tx and bedtime daily,nothing 4 hours prior to rad txs    . levothyroxine (SYNTHROID, LEVOTHROID) 112 MCG tablet TAKE 1 TABLET (112 MCG TOTAL) BY MOUTH DAILY. 90 tablet 3  . lidocaine-prilocaine (EMLA) cream Apply to affected area once 30 g 3  . losartan (COZAAR) 100 MG tablet TAKE 1 TABLET BY MOUTH EVERY DAY FOR BLOOD PRESSURE 90 tablet 1  . non-metallic deodorant (ALRA) MISC Apply 1 application topically. Apply after rad tx and as needed, nothing 4 hours prior to rad tx    . rosuvastatin (CRESTOR) 20 MG tablet Take 1 tablet (20 mg total) by mouth daily. 90 tablet 2  . venlafaxine XR (EFFEXOR-XR) 75 MG 24 hr capsule Take 1 capsule (75 mg total) daily with breakfast by mouth. 30 capsule 6  . vitamin B-12 (CYANOCOBALAMIN) 1000 MCG tablet Take 1,000 mcg by mouth every other day.    . Wound Dressings (SONAFINE EX) Apply 1 application topically 2 (two) times daily.     No current facility-administered medications on file prior to visit.     BP (!) 150/94 (BP Location: Left Arm, Patient Position: Sitting, Cuff Size: Normal)   Pulse 92   Temp 97.6 F (36.4 C) (Oral)   Resp 16   Ht 5\' 4"  (1.626 m)   SpO2 96%   BMI 23.69 kg/m      Objective:   Physical Exam  Constitutional: She is oriented to person, place, and time. She appears well-developed and well-nourished. No distress.  HENT:  Head: Normocephalic and atraumatic.  Eyes: EOM are normal. Pupils are equal, round, and reactive to light.  Neck: Normal range of motion. Neck supple.  Cardiovascular: Normal rate, regular rhythm, normal heart sounds and intact distal pulses.  Pulmonary/Chest: Effort normal and breath sounds normal. She has no wheezes.  Musculoskeletal: She exhibits no edema.  Neurological: She is alert and oriented  to person, place, and time. She has normal strength. She displays normal reflexes. No cranial nerve deficit or sensory deficit. Coordination and gait normal.  Skin: Skin is warm and dry.  Psychiatric: She has a normal mood and affect. Judgment and thought content normal.  Vitals reviewed.     Assessment & Plan:  Hypertension, unspecified type

## 2017-02-19 NOTE — Addendum Note (Signed)
Addended by: Chauncey Cruel on: 02/19/2017 03:03 PM   Modules accepted: Orders

## 2017-02-19 NOTE — Patient Instructions (Addendum)
Please START amlodipine 5 mg once daily for your high blood pressure. Please CONTINUE losartan 100 mg once daily for your blood pressure. DOUBLE CHECK YOUR MEDICATION BOTTLES TO BE SURE YOU ARE TAKING THE RIGHT MEDICATIONS.  Please return in about 2 weeks, so that we can make sure your blood pressure is improving on the new medications. You can also bring your blood pressure cuff with you to your next visit so that we can make sure its working correctly.  If you develop sudden weakness, confusion, trouble with your speech, chest pain or shortness of breath, please call 911 right away.  It was nice to meet you. Thanks for letting me take care of you today :)

## 2017-03-04 ENCOUNTER — Encounter: Payer: Self-pay | Admitting: Radiation Oncology

## 2017-03-04 NOTE — Progress Notes (Signed)
  Radiation Oncology         ([phone]) [phone] ________________________________  Name: [name] [name] MRN: [id number]  Date: 03/04/2017  DOB: 1941-02-02  End of Treatment Note  Diagnosis:  Malignant neoplasm of upper-outer quadrant of left female breast     Indication for treatment:  Curative  Radiation treatment dates:  01/07/2017 - 02/13/2017  Site/dose:   Left breast/ 2.5 Gy x 83fx  Boost/ 2.5Gy x 40fx  Beams/energy:  Photon/6X  Narrative: The patient tolerated radiation treatment relatively well.    Plan: The patient has completed radiation treatment. The patient will return to radiation oncology clinic for routine followup in one month. I advised them to call or return sooner if they have any questions or concerns related to their recovery or treatment.  ------------------------------------------------  Jodelle Gross, MD, PhD  This document serves as a record of services personally performed by [name], MD. It was created on his behalf by Valeta Harms, a trained medical scribe. The creation of this record is based on the scribe's personal observations and the provider's statements to them. This document has been checked and approved by the attending provider.

## 2017-03-05 ENCOUNTER — Ambulatory Visit: Payer: Medicare Other | Admitting: Internal Medicine

## 2017-03-05 NOTE — Progress Notes (Signed)
Simulation verification  The patient was brought to the treatment machine and placed in the plan treatment position.  Clinical set up was verified to ensure that the target region is appropriately covered for the patient's upcoming electron boost treatment.  The targeted volume of tissue is appropriately covered by the radiation field.  Based on my personal review, I approve the simulation verification.  The patient's treatment will proceed as planned.  ------------------------------------------------  Jodelle Gross, MD, PhD

## 2017-03-14 ENCOUNTER — Ambulatory Visit (INDEPENDENT_AMBULATORY_CARE_PROVIDER_SITE_OTHER): Payer: Medicare Other | Admitting: Internal Medicine

## 2017-03-14 ENCOUNTER — Encounter: Payer: Self-pay | Admitting: Internal Medicine

## 2017-03-14 DIAGNOSIS — Z170 Estrogen receptor positive status [ER+]: Secondary | ICD-10-CM

## 2017-03-14 DIAGNOSIS — D485 Neoplasm of uncertain behavior of skin: Secondary | ICD-10-CM

## 2017-03-14 DIAGNOSIS — E538 Deficiency of other specified B group vitamins: Secondary | ICD-10-CM | POA: Diagnosis not present

## 2017-03-14 DIAGNOSIS — E890 Postprocedural hypothyroidism: Secondary | ICD-10-CM | POA: Diagnosis not present

## 2017-03-14 DIAGNOSIS — C50412 Malignant neoplasm of upper-outer quadrant of left female breast: Secondary | ICD-10-CM

## 2017-03-14 DIAGNOSIS — I10 Essential (primary) hypertension: Secondary | ICD-10-CM

## 2017-03-14 DIAGNOSIS — E559 Vitamin D deficiency, unspecified: Secondary | ICD-10-CM

## 2017-03-14 DIAGNOSIS — Z853 Personal history of malignant neoplasm of breast: Secondary | ICD-10-CM | POA: Diagnosis not present

## 2017-03-14 MED ORDER — ALPRAZOLAM 0.25 MG PO TABS: 0.1250 mg | ORAL_TABLET | Freq: Two times a day (BID) | ORAL | 1 refills | Status: DC | PRN

## 2017-03-14 NOTE — Assessment & Plan Note (Signed)
Relapse of breast ca - s/p XRT and chemo 2018

## 2017-03-14 NOTE — Assessment & Plan Note (Signed)
Labs

## 2017-03-14 NOTE — Assessment & Plan Note (Signed)
R cheek - fast growing ?cancer: ref to [facility] in [place]

## 2017-03-14 NOTE — Assessment & Plan Note (Signed)
On Vit D 

## 2017-03-14 NOTE — Progress Notes (Signed)
Subjective:  Patient ID: [name] [name], female    DOB: 09-30-1940  Age: 77 y.o. MRN: [id number]  CC: No chief complaint on file.   HPI Mikalah Skyles Starkel presents for breast ca - s/p XRT and chemo F/u anxiety, HTN, hypothyroidism f/u  Outpatient Medications Prior to Visit  Medication Sig Dispense Refill  . ALPRAZolam (XANAX) 0.25 MG tablet Take 0.125-0.25 mg by mouth 2 (two) times daily as needed for anxiety or sleep (Pt takes a half of a tablet every evening).     [name] amLODipine (NORVASC) 5 MG tablet Take 1 tablet (5 mg total) by mouth daily. 90 tablet 3  . cholecalciferol (VITAMIN D) 1000 units tablet Take 1,000 Units by mouth daily.    . DULoxetine (CYMBALTA) 60 MG capsule Take 60 mg by mouth every morning.     . hyaluronate sodium (RADIAPLEXRX) GEL Apply 1 application topically 2 (two) times daily. Apply  To affected breast after rad tx and bedtime daily,nothing 4 hours prior to rad txs    . levothyroxine (SYNTHROID, LEVOTHROID) 112 MCG tablet TAKE 1 TABLET (112 MCG TOTAL) BY MOUTH DAILY. 90 tablet 3  . lidocaine-prilocaine (EMLA) cream Apply to affected area once 30 g 3  . losartan (COZAAR) 100 MG tablet TAKE 1 TABLET BY MOUTH EVERY DAY FOR BLOOD PRESSURE 90 tablet 1  . non-metallic deodorant (ALRA) MISC Apply 1 application topically. Apply after rad tx and as needed, nothing 4 hours prior to rad tx    . rosuvastatin (CRESTOR) 20 MG tablet Take 1 tablet (20 mg total) by mouth daily. 90 tablet 2  . venlafaxine XR (EFFEXOR-XR) 75 MG 24 hr capsule Take 1 capsule (75 mg total) daily with breakfast by mouth. 30 capsule 6  . vitamin B-12 (CYANOCOBALAMIN) 1000 MCG tablet Take 1,000 mcg by mouth every other day.    . Wound Dressings (SONAFINE EX) Apply 1 application topically 2 (two) times daily.     No facility-administered medications prior to visit.     ROS Review of Systems  Constitutional: Negative for activity change, appetite change, chills, fatigue and unexpected weight change.    HENT: Negative for congestion, mouth sores and sinus pressure.   Eyes: Negative for visual disturbance.  Respiratory: Negative for cough and chest tightness.   Gastrointestinal: Negative for abdominal pain and nausea.  Genitourinary: Negative for difficulty urinating, frequency and vaginal pain.  Musculoskeletal: Negative for back pain and gait problem.  Skin: Negative for pallor and rash.  Neurological: Negative for dizziness, tremors, weakness, numbness and headaches.  Psychiatric/Behavioral: Positive for sleep disturbance. Negative for confusion and suicidal ideas. The patient is nervous/anxious.     Objective:  BP 110/68 (BP Location: Left Arm, Patient Position: Sitting, Cuff Size: Normal)   Pulse 94   Temp 98.2 F (36.8 C) (Oral)   Ht 5\' 4"  (1.626 m)   Wt 140 lb (63.5 kg)   SpO2 98%   BMI 24.03 kg/m   BP Readings from Last 3 Encounters:  03/14/17 110/68  02/19/17 (!) 150/98  02/19/17 (!) 173/99    Wt Readings from Last 3 Encounters:  03/14/17 140 lb (63.5 kg)  02/19/17 138 lb (62.6 kg)  12/25/16 143 lb 14.4 oz (65.3 kg)    Physical Exam  Constitutional: She appears well-developed. No distress.  HENT:  Head: Normocephalic.  Right Ear: External ear normal.  Left Ear: External ear normal.  Nose: Nose normal.  Mouth/Throat: Oropharynx is clear and moist.  Eyes: Conjunctivae are normal. Pupils are equal,  round, and reactive to light. Right eye exhibits no discharge. Left eye exhibits no discharge.  Neck: Normal range of motion. Neck supple. No JVD present. No tracheal deviation present. No thyromegaly present.  Cardiovascular: Normal rate, regular rhythm and normal heart sounds.  Pulmonary/Chest: No stridor. No respiratory distress. She has no wheezes.  Abdominal: Soft. Bowel sounds are normal. She exhibits no distension and no mass. There is no tenderness. There is no rebound and no guarding.  Musculoskeletal: She exhibits no edema or tenderness.  Lymphadenopathy:     She has no cervical adenopathy.  Neurological: She displays normal reflexes. No cranial nerve deficit. She exhibits normal muscle tone. Coordination normal.  Skin: No rash noted. No erythema.  Psychiatric: She has a normal mood and affect. Her behavior is normal. Judgment and thought content normal.   R cheek - fast growing ?cancer    Lab Results  Component Value Date   WBC 5.7 12/30/2016   HGB 11.1 (L) 12/30/2016   HCT 34.0 (L) 12/30/2016   PLT 391 12/30/2016   GLUCOSE 95 12/30/2016   CHOL 205 (H) 06/05/2015   TRIG 257.0 (H) 06/05/2015   HDL 44.60 06/05/2015   LDLDIRECT 125.0 06/05/2015   LDLCALC 122 (H) 12/04/2007   ALT 51 12/11/2016   AST 65 (H) 12/11/2016   NA 139 12/30/2016   K 4.3 12/30/2016   CL 105 12/30/2016   CREATININE 1.11 (H) 12/30/2016   BUN 14 12/30/2016   CO2 26 12/30/2016   TSH 12.55 (H) 08/21/2015   INR 0.98 12/30/2016   HGBA1C 5.4 12/05/2010    No results found.  Assessment & Plan:   There are no diagnoses linked to this encounter. I am having Josselin T. Delatorre maintain her ALPRAZolam, DULoxetine, rosuvastatin, losartan, levothyroxine, cholecalciferol, vitamin B-12, lidocaine-prilocaine, venlafaxine XR, hyaluronate sodium, non-metallic deodorant, Wound Dressings (SONAFINE EX), and amLODipine.  No orders of the defined types were placed in this encounter.    Follow-up: No Follow-up on file.  Walker Kehr, MD

## 2017-03-14 NOTE — Assessment & Plan Note (Signed)
On B12 

## 2017-03-14 NOTE — Assessment & Plan Note (Signed)
On Rx 

## 2017-03-16 ENCOUNTER — Other Ambulatory Visit: Payer: Self-pay | Admitting: Internal Medicine

## 2017-03-19 DIAGNOSIS — C44329 Squamous cell carcinoma of skin of other parts of face: Secondary | ICD-10-CM | POA: Diagnosis not present

## 2017-03-19 DIAGNOSIS — D485 Neoplasm of uncertain behavior of skin: Secondary | ICD-10-CM | POA: Diagnosis not present

## 2017-03-24 ENCOUNTER — Encounter: Payer: Self-pay | Admitting: Radiation Oncology

## 2017-03-24 ENCOUNTER — Other Ambulatory Visit: Payer: Self-pay

## 2017-03-24 ENCOUNTER — Ambulatory Visit
Admission: RE | Admit: 2017-03-24 | Discharge: 2017-03-24 | Disposition: A | Discharge status: Home / Self Care | Payer: Medicare Other | Source: Ambulatory Visit | Attending: Radiation Oncology | Admitting: Radiation Oncology

## 2017-03-24 VITALS — BP 125/78 | HR 104 | Temp 98.4°F | Resp 20 | Ht 64.0 in | Wt 140.2 lb

## 2017-03-24 DIAGNOSIS — C50412 Malignant neoplasm of upper-outer quadrant of left female breast: Secondary | ICD-10-CM | POA: Insufficient documentation

## 2017-03-24 DIAGNOSIS — L299 Pruritus, unspecified: Secondary | ICD-10-CM | POA: Diagnosis not present

## 2017-03-24 DIAGNOSIS — Z170 Estrogen receptor positive status [ER+]: Secondary | ICD-10-CM

## 2017-03-24 DIAGNOSIS — Z79899 Other long term (current) drug therapy: Secondary | ICD-10-CM | POA: Insufficient documentation

## 2017-03-24 DIAGNOSIS — Z923 Personal history of irradiation: Secondary | ICD-10-CM | POA: Diagnosis not present

## 2017-03-24 DIAGNOSIS — Z171 Estrogen receptor negative status [ER-]: Secondary | ICD-10-CM | POA: Insufficient documentation

## 2017-03-24 NOTE — Progress Notes (Signed)
Radiation Oncology         [phone] ________________________________  Name: [name] [name] MRN: [id number]  Date of Service: 03/24/2017  DOB: 1940-11-15  Post Treatment Note  CC: Plotnikov, Evie Lacks, MD  Magrinat, [name], MD  Diagnosis:   Stage IA, pT1bN0M0 grade 3, triple negative invasive ductal carcinoma of the left breast.  Interval Since Last Radiation:  6 weeks   01/07/2017 - 02/13/2017: Left breast/ 2.5 Gy x 2fx Boost/ 2.5Gy x 17fx   Narrative:  The patient returns today for routine follow-up. During treatment she did very well with radiotherapy and did not have significant desquamation.                             On review of systems, the patient states she's doing well with only mild itching of her left breast. She has been using OTC hydrocortisone as needed. No other complaints are noted.  ALLERGIES:  is allergic to pneumococcal vaccines; sulfa antibiotics; sulfacetamide sodium-sulfur; tape; and tegaderm ag mesh [silver].  Meds: Current Outpatient Medications  Medication Sig Dispense Refill  . ALPRAZolam (XANAX) 0.25 MG tablet Take 0.5-1 tablets (0.125-0.25 mg total) by mouth 2 (two) times daily as needed for anxiety or sleep (Pt takes a half of a tablet every evening). 30 tablet 1  . amLODipine (NORVASC) 5 MG tablet Take 1 tablet (5 mg total) by mouth daily. 90 tablet 3  . cholecalciferol (VITAMIN D) 1000 units tablet Take 1,000 Units by mouth daily.    [name] levothyroxine (SYNTHROID, LEVOTHROID) 112 MCG tablet TAKE 1 TABLET (112 MCG TOTAL) BY MOUTH DAILY. 90 tablet 3  . losartan (COZAAR) 100 MG tablet TAKE 1 TABLET BY MOUTH EVERY DAY FOR BLOOD PRESSURE 90 tablet 3  . rosuvastatin (CRESTOR) 20 MG tablet Take 1 tablet (20 mg total) by mouth daily. 90 tablet 2  . venlafaxine XR (EFFEXOR-XR) 75 MG 24 hr capsule Take 1 capsule (75 mg total) daily with breakfast by mouth. 30 capsule 6  . vitamin B-12 (CYANOCOBALAMIN) 1000 MCG tablet Take 1,000 mcg by mouth every other  day.    . DULoxetine (CYMBALTA) 60 MG capsule Take 60 mg by mouth every morning.      No current facility-administered medications for this encounter.     Physical Findings:  height is 5\' 4"  (1.626 m) and weight is 140 lb 3.2 oz (63.6 kg). Her oral temperature is 98.4 F (36.9 C). Her blood pressure is 125/78 and her pulse is 104 (abnormal). Her respiration is 20 and oxygen saturation is 99%.  Pain Assessment Pain Score: 0-No pain/10 In general this is a well appearing caucasian female in no acute distress. She's alert and oriented x4 and appropriate throughout the examination. Cardiopulmonary assessment is negative for acute distress and she exhibits normal effort. The left breast was examined and reveals dryness without desquamation.   Lab Findings: Lab Results  Component Value Date   WBC 5.7 12/30/2016   HGB 11.1 (L) 12/30/2016   HCT 34.0 (L) 12/30/2016   MCV 104.6 (H) 12/30/2016   PLT 391 12/30/2016     Radiographic Findings: No results found.  Impression/Plan: 1. Stage IA, pT1bN0M0 grade 3, triple negative invasive ductal carcinoma of the left breast. The patient has been doing well since completion of radiotherapy. We discussed that we would be happy to continue to follow her as needed, but she will also continue to follow up with Dr. Jana Hakim in medical oncology  especially considering her history of right breast cancer. She was counseled on skin care as well as measures to avoid sun exposure to this area.       Carola Rhine, PAC

## 2017-04-16 ENCOUNTER — Telehealth: Payer: Self-pay | Admitting: Oncology

## 2017-04-16 ENCOUNTER — Ambulatory Visit: Payer: Medicare Other | Admitting: Oncology

## 2017-04-16 ENCOUNTER — Other Ambulatory Visit: Payer: Medicare Other

## 2017-04-16 NOTE — Telephone Encounter (Signed)
patient called to reschedule she is sick

## 2017-04-22 ENCOUNTER — Telehealth: Payer: Self-pay | Admitting: Internal Medicine

## 2017-04-22 NOTE — Telephone Encounter (Signed)
Copied from [place] [id number]. Topic: Quick Communication - Rx Refill/Question >> Apr 22, 2017 11:45 AM Margot Ables wrote: Pt called pharmacy and the losartan she has was not affected but she feels she would like to change to another medication anyway and that Dr. Alain Marion told her to call if that was the case.

## 2017-04-23 DIAGNOSIS — C44329 Squamous cell carcinoma of skin of other parts of face: Secondary | ICD-10-CM | POA: Diagnosis not present

## 2017-04-23 NOTE — Telephone Encounter (Signed)
Please advise when you return

## 2017-04-27 MED ORDER — OLMESARTAN MEDOXOMIL 20 MG PO TABS: 20.0000 mg | ORAL_TABLET | Freq: Every day | ORAL | 11 refills | Status: DC

## 2017-04-27 NOTE — Telephone Encounter (Signed)
Discontinue losartan.  Start Benicar 20 mg once a day.  Prescription was emailed. Thank you

## 2017-04-29 NOTE — Telephone Encounter (Signed)
Called and left message for patient today. CRM created incase she calls back regarding message left.

## 2017-05-05 ENCOUNTER — Telehealth: Payer: Self-pay | Admitting: *Deleted

## 2017-05-05 NOTE — Telephone Encounter (Signed)
FYI "I forgot when my appointment is for this month?  I need to schedule a bone scan" Provided Wednesday, May 05, 2017 appointment information.  Asked for 30 minute early arrival for registration.  Bone scan scheduled in April.   "Dr. Jana Hakim needs the bone scan results before he see's me.  I need to reschedule a bone scan before I see him.   I have the [facility] phone number to reschedule what my husband must have scheduled.  Call forwarded to collaborative for further scheduling options.  Denies further needs or questions of this nurse at this time.

## 2017-05-05 NOTE — Telephone Encounter (Signed)
Scan rescheduled for tomorrow at 3:00 pm.

## 2017-05-06 ENCOUNTER — Ambulatory Visit
Admission: RE | Admit: 2017-05-06 | Discharge: 2017-05-06 | Disposition: A | Discharge status: Home / Self Care | Payer: Medicare Other | Source: Ambulatory Visit | Attending: Oncology | Admitting: Oncology

## 2017-05-06 DIAGNOSIS — M8580 Other specified disorders of bone density and structure, unspecified site: Secondary | ICD-10-CM

## 2017-05-06 DIAGNOSIS — Z170 Estrogen receptor positive status [ER+]: Principal | ICD-10-CM

## 2017-05-06 DIAGNOSIS — Z780 Asymptomatic menopausal state: Secondary | ICD-10-CM | POA: Diagnosis not present

## 2017-05-06 DIAGNOSIS — C50412 Malignant neoplasm of upper-outer quadrant of left female breast: Secondary | ICD-10-CM

## 2017-05-06 DIAGNOSIS — M810 Age-related osteoporosis without current pathological fracture: Secondary | ICD-10-CM | POA: Diagnosis not present

## 2017-05-06 NOTE — Progress Notes (Signed)
. [name]   DOB: 04/16/1940  MR#: [id number]  CSN#:[id number]  PCP: [name], MD Patient Care Team: [name], MD as PCP - General [name], [name], MD as Consulting Physician (Oncology) [name], MD as Consulting Physician (General Surgery) [name], MD as Consulting Physician (Gastroenterology) [name], MD as Consulting Physician (Radiation Oncology)   CHIEF COMPLAINT: Weakly estrogen receptor positive breast cancer  CURRENT TREATMENT: Anastrozole   INTERVAL HISTORY: [name] [name] returns today for follow-up and treatment of her weakly estrogen receptor positive breast cancer. She has not yet started anastrozole. As of right now, she does not experience hot flashes.   She had a bone density on 05/06/2017 showing a T score of -3.4 (osteoporosis).  REVIEW OF SYSTEMS: [name] [name] reports that she had a squamous cell skin cancer removed on the upper right cheek. She developed a black eye after surgery, and now the scar is red. She has left breast excision changes. She has occasional soreness and shooting pains in the breast. She has a new great granddaughter who was born in February. She has also been doing a lot of yard work. She denies unusual headaches, visual changes, nausea, vomiting, or dizziness. There has been no unusual cough, phlegm production, or pleurisy. This been no change in bowel or bladder habits. She denies unexplained fatigue or unexplained weight loss, bleeding, rash, or fever. A detailed review of systems was otherwise stable.    BREAST CANCER HISTORY: From the recent summary note:  I last saw [name] [name] in 2013 when she was released from follow-up from her right breast cancer recurrence in 2004. She is status post right mastectomy and adjuvant chemotherapy for that triple negative tumor. More recently, on 07/16/2016 she underwent left screening mammography at the [facility] on 07/16/2016. This found the breast density to be category  B. There was a possible asymmetry in the left breast, and on 07/19/2016 she underwent left diagnostic mammography with tomography and left breast ultrasonography. This confirmed a well circumscribed oval mass at the 9:00 position of the left breast measuring approximately 0.8 cm. This was not palpable. On ultrasonography the mass measured 0.6 cm and it was located at the 9:00 radiant 4 cm from the nipple. The left axilla was sonographically benign.  On June 11 [name] underwent biopsy of the left breast mass in question and this showed (SAA [id number]) and invasive ductal carcinoma, grade 3, estrogen receptor 5% positive with weak staining intensity, progesterone receptor negative, with an MIB-1 of 80%, and HER-2 nonamplified, the signals ratio being 1.46 and the number per cell 1.90.  Her subsequent history is as detailed below.   PAST MEDICAL HISTORY: Past Medical History:  Diagnosis Date  . Allergic rhinitis   . Anxiety   . Breast cancer ([place]) hx 2004   recurrent 2006 DR Tarina Volk  . Family history of breast cancer   . 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 HTN (hypertension)   . Hyperlipidemia   . Hypothyroidism   . Personal history of chemotherapy 2002  . Personal history of radiation therapy 2002  . Psoriasis   . S/P thyroidectomy 07/2005   2 cm largest diameter (1 other tiny focus)/ i-131 rx 99 mci 08/2005  . Thyroid cancer ([place])    Papillary Stage 1 - Dr Loanne Drilling  . Vitamin B12 deficiency 2009  . Vitamin D deficiency 2009    PAST SURGICAL HISTORY: Past Surgical History:  Procedure Laterality Date  . BREAST LUMPECTOMY    . BREAST LUMPECTOMY WITH RADIOACTIVE SEED AND SENTINEL LYMPH NODE BIOPSY Left 09/11/2016   Procedure: LEFT BREAST LUMPECTOMY WITH RADIOACTIVE SEED AND LEFT AXILLARY SENTINEL LYMPH NODE BIOPSY WITH BLUE DYE INJECTION;  Surgeon: [name], MD;  Location: [facility];  Service:  General;  Laterality: Left;  . IR FLUORO GUIDE PORT INSERTION RIGHT  09/13/2016  . IR REMOVAL TUN ACCESS W/ PORT W/O FL MOD SED  12/30/2016  . IR US GUIDE VASC ACCESS RIGHT  09/13/2016  . MASTECTOMY     Right  . PORTACATH PLACEMENT N/A 09/11/2016   Procedure: ATTEMPTED INSERTION PORT-A-CATH WITH ULTRA SOUND GUIDANCE;  Surgeon: [name], MD;  Location: [facility];  Service: General;  Laterality: N/A;  . REDUCTION MAMMAPLASTY Left 2005  . THYROIDECTOMY  2007    FAMILY HISTORY Family History  Problem Relation Age of Onset  . Stroke Mother   . Allergies Mother   . Asthma Mother   . Clotting disorder Mother   . Heart disease Father 67       MI  . Allergies Sister   . Asthma Sister   . Breast cancer Sister 8       Lymphoma 39; currently 63  . Asthma Brother   . Leukemia Brother        dx 13s  . Allergies Daughter   . Asthma Daughter   . Allergies Sister   . Breast cancer Sister 74       currently 59  . Cancer Paternal Aunt        kidney ca; deceased 77  . Cancer Paternal Uncle        unk. primary ("liver")  . Throat cancer Maternal Grandmother        deceased 94  . Lung cancer Maternal Grandfather        deceased 43  . Pancreatic cancer Paternal Grandfather        deceased 40  . Cancer Paternal Aunt        "abdominal"; deceased 53  Family history includes a sister, a paternal aunt, and a paternal cousin with breast cancer all older then 25 at diagnosis  GYN HISTORY: Menarche age 45, first live birth age 4. She is GX P3. She went through the change of life age 73. She did not take hormone replacement.  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 Her husband [name] [name] is retired from [employer]. Her children are [name] [name], who works for time [employer] in [place], and [name] [name], who lives in [place] and works for [employer]. The third child, [name] [name], died in an automobile accident  at age 32. The patient has one grandchild.  Her dog [name] [name] is a chance but still kicking.  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 Smoking status: Never Smoker  . Smokeless tobacco: Never Used  Substance Use Topics  . Alcohol use: No  . Drug use: No     Colonoscopy:  PAP:  Bone density: 05/06/2017 showed a T score of -3.4 osteoporosis  Lipid panel:  Allergies  Allergen Reactions  . Pneumococcal Vaccines Other (See Comments)    Was really sick   . Sulfa Antibiotics Other (See Comments)    Pt believes it was hives  . Sulfacetamide Sodium-Sulfur   . Tape Itching, Dermatitis and Rash  . Tegaderm  Ag Mesh [Silver] Itching and Rash    Current Outpatient Medications  Medication Sig Dispense Refill  . ALPRAZolam (XANAX) 0.25 MG tablet Take 0.5-1 tablets (0.125-0.25 mg total) by mouth 2 (two) times daily as needed for anxiety or sleep (Pt takes a half of a tablet every evening). 30 tablet 1  . amLODipine (NORVASC) 5 MG tablet Take 1 tablet (5 mg total) by mouth daily. 90 tablet 3  . cholecalciferol (VITAMIN D) 1000 units tablet Take 1,000 Units by mouth daily.    . DULoxetine (CYMBALTA) 60 MG capsule Take 60 mg by mouth every morning.     [name] levothyroxine (SYNTHROID, LEVOTHROID) 112 MCG tablet TAKE 1 TABLET (112 MCG TOTAL) BY MOUTH DAILY. 90 tablet 3  . olmesartan (BENICAR) 20 MG tablet Take 1 tablet (20 mg total) by mouth daily. 30 tablet 11  . rosuvastatin (CRESTOR) 20 MG tablet Take 1 tablet (20 mg total) by mouth daily. 90 tablet 2  . venlafaxine XR (EFFEXOR-XR) 75 MG 24 hr capsule Take 1 capsule (75 mg total) daily with breakfast by mouth. 30 capsule 6  . vitamin B-12 (CYANOCOBALAMIN) 1000 MCG tablet Take 1,000 mcg by mouth every other day.     No current facility-administered medications for this visit.     OBJECTIVE: Older white woman who appears well  Vitals:   05/07/17 0934  BP: 125/81  Pulse: 94  Resp: 18  Temp: 97.6 F (36.4 C)  SpO2:  100%     Body mass index is 24.61 kg/m.    ECOG FS: 1  Sclerae unicteric, EOMs intact Oropharynx clear and moist No cervical or supraclavicular adenopathy Lungs no rales or rhonchi Heart regular rate and rhythm Abd soft, nontender, positive bowel sounds MSK no focal spinal tenderness, no upper extremity lymphedema Neuro: nonfocal, well oriented, appropriate affect Breasts: The right breast is status post lumpectomy and radiation, with no evidence of local recurrence.  The left breast is also status post lumpectomy and radiation, with what appears to be a seroma in the upper inner quadrant.  There is no erythema or pain.  Both axillae are benign Skin: The skin of the right cheek area is status post recent squamous cell cancer surgery.  It is healing nicely  LAB RESULTS: Lab Results  Component Value Date   WBC 5.7 12/30/2016   NEUTROABS 3.0 12/30/2016   HGB 11.1 (L) 12/30/2016   HCT 34.0 (L) 12/30/2016   MCV 104.6 (H) 12/30/2016   PLT 391 12/30/2016      Chemistry      Component Value Date/Time   NA 139 12/30/2016 1157   NA 139 12/11/2016 1017   K 4.3 12/30/2016 1157   K 3.7 12/11/2016 1017   CL 105 12/30/2016 1157   CL 104 10/09/2011 1403   CO2 26 12/30/2016 1157   CO2 22 12/11/2016 1017   BUN 14 12/30/2016 1157   BUN 16.9 12/11/2016 1017   CREATININE 1.11 (H) 12/30/2016 1157   CREATININE 1.2 (H) 12/11/2016 1017      Component Value Date/Time   CALCIUM 9.3 12/30/2016 1157   CALCIUM 9.0 12/11/2016 1017   ALKPHOS 139 12/11/2016 1017   AST 65 (H) 12/11/2016 1017   ALT 51 12/11/2016 1017   BILITOT 0.42 12/11/2016 1017       Lab Results  Component Value Date   LABCA2 16 10/09/2011    No components found for: [id number]  No results for input(s): INR in the last 168 hours.  Urinalysis  Component Value Date/Time   COLORURINE YELLOW 06/05/2015 1559   APPEARANCEUR CLEAR 06/05/2015 1559   LABSPEC 1.020 06/05/2015 1559   PHURINE 5.5 06/05/2015 1559   GLUCOSEU  NEGATIVE 06/05/2015 1559   HGBUR NEGATIVE 06/05/2015 1559   BILIRUBINUR NEGATIVE 06/05/2015 1559   KETONESUR NEGATIVE 06/05/2015 1559   UROBILINOGEN 0.2 06/05/2015 1559   NITRITE NEGATIVE 06/05/2015 1559   LEUKOCYTESUR MODERATE (A) 06/05/2015 1559    STUDIES:   ASSESSMENT: 77 y.o. [place] woman   (1) status post right lumpectomy and sentinel lymph node biopsy in September 2002 for multifocal breast carcinoma, triple negative.  Treated with CMF followed by radiation.   (2) Local recurrence in April 2004, status post right modified radical mastectomy with TRAM reconstruction for what proved to be a T1c N0, stage IA triple negative breast carcinoma.  Treated adjuvantly with paclitaxel and doxorubicin x4 in 2004.  Off treatment since August 2004 with no evidence of recurrence  (3) history of papillary thyroid cancer s/p thyroidectomy June 2007, s/p radioactive iodine July 2007  (4) status post left breast upper outer quadrant biopsy 08/21/2016 for a clinical T1b N0, stage IB invasive ductal carcinoma, grade 3, weakly estrogen receptor positive, progesterone receptor and HER-2 negative, with an MIB-1 of 80%.  (5) left lumpectomy and sentinel lymph node sampling 09/11/2016 found a pT1b pN0, stage IB invasive ductal carcinoma, grade 3, with negative margins.  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*)  (a) there were no deleterious mutations in ALK, APC, ATM, AXIN2, BAP1, BARD1, BLM, BMPR1A, BRCA1, BRCA2, BRIP1, CASR,  CDC73, CDH1, CDK4, CDKN1B, CDKN1C, CDKN2A, CEBPA, CHEK2, CTNNA1, DICER1, DIS3L2, EGFR, EPCAM, FH, FLCN, GATA2, GPC3, GREM1, HOXB13, HRAS, KIT, MAX, MEN1, MET, MITF, MLH1, MSH2, MSH3, MSH6, MUTYH, NBN, NF1, NF2, NTHL1, PALB2, PDGFRA, PHOX2B, PMS2, POLD1, POLE, POT1, PRKAR1A, PTCH1, PTEN, RAD50, RAD51C, RAD51D, RB1, RECQL4, RET, RUNX1, SDHA, SDHAF2, SDHB, SDHC, SDHD, SMAD4, SMARCA4, SMARCB1, SMARCE1, STK11, SUFU, TERC, TERT, TMEM127, TP53, TSC1, TSC2, VHL, WRN, WT1  (b) while homozygous mutations in the NPH L1 gene are associated with autosomal recessive polyposis, there is no evidence to suggest that a single mutation in this gene increases cancer risk.  (9) to start anastrozole 03/14/2017  (a) bone density on 05/06/2017 showed a T score of -3.4--osteoporosis  (b) to start denosumab/Prolia June 2019   PLAN: s I spent approximately 30 minutes with Makalynn with most of that time spent discussing her complex problems.  We reviewed her recent treatments for her breast cancer and also the recent surgery she had for her squamous cell carcinoma of the face.  I reassured her that her scar will be nearly invisible after another 6-12 months.  In the meantime she should avoid sun to that area.  She is now ready to start antiestrogens.  We again discussed anastrozole and she has a good understanding of the possible toxicities, side effects and complications of this agent.  She has osteoporosis.  Anastrozole obviously can make that even worse so we are going to consider denosumab/Prolia.  Today we  discussed the possible toxicities, side effects and complications of that agent and we will try to get it started in June when she returns to see me.  She understands the very remote risk of osteonecrosis of the jaw but in any case I have asked her to make sure she sees her dentist before starting on denosumab  She is very excited about her new great granddaughter.  He is looking forward to her 60th high school reunion in  June of this year.  She will return to see me in June.  She knows to call for any problem but may develop before her next visit.   Lilliam Chamblee, [name], MD  05/07/17 9:52 AM Medical Oncology and Hematology [facility] [address] [place], [name] [address] Tel. [phone]    Fax. [phone]   This document serves as a record of services personally performed by Lurline Del, MD. It was created on his behalf by Sheron Nightingale, a trained medical scribe. The creation of this record is based on the scribe's personal observations and the provider's statements to them.   I have reviewed the above documentation for accuracy and completeness, and I agree with the above.

## 2017-05-07 ENCOUNTER — Inpatient Hospital Stay (HOSPITAL_BASED_OUTPATIENT_CLINIC_OR_DEPARTMENT_OTHER): Payer: Medicare Other | Admitting: Oncology

## 2017-05-07 ENCOUNTER — Inpatient Hospital Stay: Payer: Medicare Other | Attending: Oncology

## 2017-05-07 VITALS — BP 125/81 | HR 94 | Temp 97.6°F | Resp 18 | Ht 64.0 in | Wt 143.4 lb

## 2017-05-07 DIAGNOSIS — C50412 Malignant neoplasm of upper-outer quadrant of left female breast: Secondary | ICD-10-CM

## 2017-05-07 DIAGNOSIS — Z79811 Long term (current) use of aromatase inhibitors: Secondary | ICD-10-CM | POA: Diagnosis not present

## 2017-05-07 DIAGNOSIS — M810 Age-related osteoporosis without current pathological fracture: Secondary | ICD-10-CM | POA: Insufficient documentation

## 2017-05-07 DIAGNOSIS — I10 Essential (primary) hypertension: Secondary | ICD-10-CM | POA: Insufficient documentation

## 2017-05-07 DIAGNOSIS — Z853 Personal history of malignant neoplasm of breast: Secondary | ICD-10-CM | POA: Insufficient documentation

## 2017-05-07 DIAGNOSIS — E785 Hyperlipidemia, unspecified: Secondary | ICD-10-CM | POA: Diagnosis not present

## 2017-05-07 DIAGNOSIS — E538 Deficiency of other specified B group vitamins: Secondary | ICD-10-CM

## 2017-05-07 DIAGNOSIS — Z800 Family history of malignant neoplasm of digestive organs: Secondary | ICD-10-CM | POA: Insufficient documentation

## 2017-05-07 DIAGNOSIS — Z806 Family history of leukemia: Secondary | ICD-10-CM

## 2017-05-07 DIAGNOSIS — Z801 Family history of malignant neoplasm of trachea, bronchus and lung: Secondary | ICD-10-CM | POA: Insufficient documentation

## 2017-05-07 DIAGNOSIS — Z85850 Personal history of malignant neoplasm of thyroid: Secondary | ICD-10-CM

## 2017-05-07 DIAGNOSIS — C50012 Malignant neoplasm of nipple and areola, left female breast: Secondary | ICD-10-CM

## 2017-05-07 DIAGNOSIS — Z79899 Other long term (current) drug therapy: Secondary | ICD-10-CM | POA: Diagnosis not present

## 2017-05-07 DIAGNOSIS — F419 Anxiety disorder, unspecified: Secondary | ICD-10-CM

## 2017-05-07 DIAGNOSIS — E559 Vitamin D deficiency, unspecified: Secondary | ICD-10-CM | POA: Diagnosis not present

## 2017-05-07 DIAGNOSIS — Z809 Family history of malignant neoplasm, unspecified: Secondary | ICD-10-CM

## 2017-05-07 DIAGNOSIS — Z803 Family history of malignant neoplasm of breast: Secondary | ICD-10-CM

## 2017-05-07 DIAGNOSIS — Z9011 Acquired absence of right breast and nipple: Secondary | ICD-10-CM | POA: Insufficient documentation

## 2017-05-07 DIAGNOSIS — Z85828 Personal history of other malignant neoplasm of skin: Secondary | ICD-10-CM

## 2017-05-07 DIAGNOSIS — E039 Hypothyroidism, unspecified: Secondary | ICD-10-CM | POA: Diagnosis not present

## 2017-05-07 DIAGNOSIS — Z170 Estrogen receptor positive status [ER+]: Secondary | ICD-10-CM | POA: Diagnosis not present

## 2017-05-07 DIAGNOSIS — Z9221 Personal history of antineoplastic chemotherapy: Secondary | ICD-10-CM

## 2017-05-07 LAB — CBC WITH DIFFERENTIAL/PLATELET
BASOS ABS: 0 10*3/uL (ref 0.0–0.1)
Basophils Relative: 0 %
EOS ABS: 0.1 10*3/uL (ref 0.0–0.5)
EOS PCT: 2 %
HCT: 38 % (ref 34.8–46.6)
Hemoglobin: 12.9 g/dL (ref 11.6–15.9)
LYMPHS PCT: 34 %
Lymphs Abs: 1.5 10*3/uL (ref 0.9–3.3)
MCH: 32.8 pg (ref 25.1–34.0)
MCHC: 33.9 g/dL (ref 31.5–36.0)
MCV: 96.7 fL (ref 79.5–101.0)
MONO ABS: 0.5 10*3/uL (ref 0.1–0.9)
Monocytes Relative: 11 %
Neutro Abs: 2.3 10*3/uL (ref 1.5–6.5)
Neutrophils Relative %: 53 %
PLATELETS: 206 10*3/uL (ref 145–400)
RBC: 3.93 MIL/uL (ref 3.70–5.45)
RDW: 13.7 % (ref 11.2–14.5)
WBC: 4.4 10*3/uL (ref 3.9–10.3)

## 2017-05-07 LAB — COMPREHENSIVE METABOLIC PANEL WITH GFR
ALT: 14 U/L (ref 0–55)
AST: 19 U/L (ref 5–34)
Albumin: 3.9 g/dL (ref 3.5–5.0)
Alkaline Phosphatase: 136 U/L (ref 40–150)
Anion gap: 11 (ref 3–11)
BUN: 21 mg/dL (ref 7–26)
CO2: 24 mmol/L (ref 22–29)
Calcium: 9.8 mg/dL (ref 8.4–10.4)
Chloride: 104 mmol/L (ref 98–109)
Creatinine, Ser: 1 mg/dL (ref 0.60–1.10)
GFR calc Af Amer: >60 mL/min
GFR calc non Af Amer: 53 mL/min — ABNORMAL LOW
Glucose, Bld: 86 mg/dL (ref 70–140)
Potassium: 3.5 mmol/L (ref 3.5–5.1)
Sodium: 139 mmol/L (ref 136–145)
Total Bilirubin: 0.8 mg/dL (ref 0.2–1.2)
Total Protein: 7.5 g/dL (ref 6.4–8.3)

## 2017-05-07 MED ORDER — ANASTROZOLE 1 MG PO TABS: 1.0000 mg | ORAL_TABLET | Freq: Every day | ORAL | 4 refills | Status: DC

## 2017-05-08 ENCOUNTER — Other Ambulatory Visit: Payer: Self-pay

## 2017-05-08 MED ORDER — TELMISARTAN 40 MG PO TABS: 40.0000 mg | ORAL_TABLET | Freq: Every day | ORAL | 3 refills | Status: DC

## 2017-05-28 ENCOUNTER — Other Ambulatory Visit: Payer: Medicare Other

## 2017-06-12 ENCOUNTER — Other Ambulatory Visit: Payer: Self-pay | Admitting: Internal Medicine

## 2017-06-13 ENCOUNTER — Ambulatory Visit (INDEPENDENT_AMBULATORY_CARE_PROVIDER_SITE_OTHER): Payer: Medicare Other | Admitting: Internal Medicine

## 2017-06-13 ENCOUNTER — Encounter: Payer: Self-pay | Admitting: Internal Medicine

## 2017-06-13 ENCOUNTER — Ambulatory Visit: Payer: Medicare Other | Admitting: Internal Medicine

## 2017-06-13 DIAGNOSIS — Z853 Personal history of malignant neoplasm of breast: Secondary | ICD-10-CM

## 2017-06-13 DIAGNOSIS — E538 Deficiency of other specified B group vitamins: Secondary | ICD-10-CM

## 2017-06-13 DIAGNOSIS — C50412 Malignant neoplasm of upper-outer quadrant of left female breast: Secondary | ICD-10-CM | POA: Diagnosis not present

## 2017-06-13 DIAGNOSIS — F411 Generalized anxiety disorder: Secondary | ICD-10-CM

## 2017-06-13 DIAGNOSIS — Z170 Estrogen receptor positive status [ER+]: Secondary | ICD-10-CM | POA: Diagnosis not present

## 2017-06-13 DIAGNOSIS — E785 Hyperlipidemia, unspecified: Secondary | ICD-10-CM | POA: Diagnosis not present

## 2017-06-13 MED ORDER — ALPRAZOLAM 1 MG PO TABS: 0.5000 mg | ORAL_TABLET | Freq: Two times a day (BID) | ORAL | 3 refills | Status: DC | PRN

## 2017-06-13 MED ORDER — ROSUVASTATIN CALCIUM 20 MG PO TABS: ORAL_TABLET | ORAL | 5 refills | Status: DC

## 2017-06-13 NOTE — Progress Notes (Signed)
Subjective:  Patient ID: [name] [name], female    DOB: 1940/09/29  Age: 77 y.o. MRN: [id number]  CC: Follow-up (3 month. patient states that she wants to go over her medications because she is so hyped up at night it takes till 4am to fall asleep and states on the way to her appointment this morning she got pulled over for weaving and states she did not even know she was doing that. )   HPI [name] presents for anxiety, insomnia [name] thinks Arimidex is making her "wired" [name] wants to come off Crestor   Outpatient Medications Prior to Visit  Medication Sig Dispense Refill  . ALPRAZolam (XANAX) 0.25 MG tablet Take 0.5-1 tablets (0.125-0.25 mg total) by mouth 2 (two) times daily as needed for anxiety or sleep (Pt takes a half of a tablet every evening). 30 tablet 1  . amLODipine (NORVASC) 5 MG tablet Take 1 tablet (5 mg total) by mouth daily. 90 tablet 3  . anastrozole (ARIMIDEX) 1 MG tablet Take 1 tablet (1 mg total) by mouth daily. 90 tablet 4  . cholecalciferol (VITAMIN D) 1000 units tablet Take 1,000 Units by mouth daily.    . DULoxetine (CYMBALTA) 60 MG capsule Take 60 mg by mouth every morning.     [name] levothyroxine (SYNTHROID, LEVOTHROID) 112 MCG tablet TAKE 1 TABLET (112 MCG TOTAL) BY MOUTH DAILY. 90 tablet 3  . rosuvastatin (CRESTOR) 20 MG tablet TAKE 1 TABLET (20 MG TOTAL) BY MOUTH DAILY. 90 tablet 3  . telmisartan (MICARDIS) 40 MG tablet Take 1 tablet (40 mg total) by mouth daily. 90 tablet 3  . venlafaxine XR (EFFEXOR-XR) 75 MG 24 hr capsule Take 1 capsule (75 mg total) daily with breakfast by mouth. 30 capsule 6  . vitamin B-12 (CYANOCOBALAMIN) 1000 MCG tablet Take 1,000 mcg by mouth every other day.     No facility-administered medications prior to visit.     ROS Review of Systems  Constitutional: Negative for activity change, appetite change, chills, fatigue and unexpected weight change.  HENT: Negative for congestion, mouth sores and sinus pressure.   Eyes:  Negative for visual disturbance.  Respiratory: Negative for cough and chest tightness.   Gastrointestinal: Negative for abdominal pain and nausea.  Genitourinary: Negative for difficulty urinating, frequency and vaginal pain.  Musculoskeletal: Negative for back pain and gait problem.  Skin: Negative for pallor and rash.  Neurological: Negative for dizziness, tremors, weakness, numbness and headaches.  Psychiatric/Behavioral: Positive for agitation and dysphoric mood. Negative for confusion, sleep disturbance and suicidal ideas. The patient is nervous/anxious.     Objective:  BP 122/70 (BP Location: Left Arm, Patient Position: Sitting, Cuff Size: Normal)   Pulse 94   Temp 98 F (36.7 C) (Oral)   Ht 5\' 4"  (1.626 m)   Wt 143 lb (64.9 kg)   SpO2 96%   BMI 24.55 kg/m   BP Readings from Last 3 Encounters:  06/13/17 122/70  05/07/17 125/81  03/24/17 125/78    Wt Readings from Last 3 Encounters:  06/13/17 143 lb (64.9 kg)  05/07/17 143 lb 6.4 oz (65 kg)  03/24/17 140 lb 3.2 oz (63.6 kg)    Physical Exam  Constitutional: She appears well-developed. No distress.  HENT:  Head: Normocephalic.  Right Ear: External ear normal.  Left Ear: External ear normal.  Nose: Nose normal.  Mouth/Throat: Oropharynx is clear and moist.  Eyes: Pupils are equal, round, and reactive to light. Conjunctivae are normal. Right eye exhibits no  discharge. Left eye exhibits no discharge.  Neck: Normal range of motion. Neck supple. No JVD present. No tracheal deviation present. No thyromegaly present.  Cardiovascular: Normal rate, regular rhythm and normal heart sounds.  Pulmonary/Chest: No stridor. No respiratory distress. She has no wheezes.  Abdominal: Soft. Bowel sounds are normal. She exhibits no distension and no mass. There is no tenderness. There is no rebound and no guarding.  Musculoskeletal: She exhibits no edema or tenderness.  Lymphadenopathy:    She has no cervical adenopathy.  Neurological:  She displays normal reflexes. No cranial nerve deficit. She exhibits normal muscle tone. Coordination normal.  Skin: No rash noted. No erythema.  Psychiatric: Her behavior is normal. Judgment and thought content normal.   Anxious  Lab Results  Component Value Date   WBC 4.4 05/07/2017   HGB 12.9 05/07/2017   HCT 38.0 05/07/2017   PLT 206 05/07/2017   GLUCOSE 86 05/07/2017   CHOL 205 (H) 06/05/2015   TRIG 257.0 (H) 06/05/2015   HDL 44.60 06/05/2015   LDLDIRECT 125.0 06/05/2015   LDLCALC 122 (H) 12/04/2007   ALT 14 05/07/2017   AST 19 05/07/2017   NA 139 05/07/2017   K 3.5 05/07/2017   CL 104 05/07/2017   CREATININE 1.00 05/07/2017   BUN 21 05/07/2017   CO2 24 05/07/2017   TSH 12.55 (H) 08/21/2015   INR 0.98 12/30/2016   HGBA1C 5.4 12/05/2010    Dg Bone Density  Result Date: 05/06/2017 EXAM: DUAL X-RAY ABSORPTIOMETRY (DXA) FOR BONE MINERAL DENSITY IMPRESSION: Referring Physician:  Chauncey Cruel PATIENT: Name: [name], [name] Patient ID: [id number] Birth Date: [birth year] Height: 63.3 in. Sex: Female Measured: 05/06/2017 Weight: 143.6 lbs. Indications: Advanced Age, Breast Cancer History, Caucasian, Estrogen Deficient, Hypothyroid, Levothyroxine, Postmenopausal Fractures: Foot, Wrist Treatments: Calcium (E943.0), Vitamin D (E933.5) ASSESSMENT: The BMD measured at Femur Total Right is 0.580 g/cm2 with a T-score of -3.4. This patient is considered osteoporotic according to [facility]) criteria. Site Region Measured Date Measured Age YA BMD Significant CHANGE T-score DualFemur Total Right [age]    [age]         -3.4    0.580 g/cm2 AP Spine  L1-L4       [age]         -2.5    0.867 g/cm2 World Health Organization [facility]) criteria for post-menopausal, Caucasian Women: Normal       T-score at or above -1 SD Osteopenia   T-score between -1 and -2.5 SD Osteoporosis T-score at or below -2.5 SD RECOMMENDATION: [facility] recommends that  FDA-approved medical therapies be considered in postmenopausal women and men age 61 or older with a: 1. Hip or vertebral (clinical or morphometric) fracture. 2. T-score of less than or equal to -2.5 at the spine or hip. 3. Ten-year fracture probability by FRAX of 3% or greater for hip fracture or 20% or greater for major osteoporotic fracture. All treatment decisions require clinical judgment and consideration of individual patient factors, including patient preferences, co-morbidities, previous drug use, risk factors not captured in the FRAX model (e.g. falls, vitamin D deficiency, increased bone turnover, interval significant decline in bone density) and possible under- or over-estimation of fracture risk by FRAX. All patients should ensure an adequate intake of dietary calcium (1200 mg/d) and vitamin D (800 IU daily) unless contraindicated. FOLLOW-UP: People with diagnosed cases of osteoporosis or at high risk for fracture should have regular bone mineral density tests. For patients eligible for  Medicare, routine testing is allowed once every 2 years. The testing frequency can be increased to one year for patients who have rapidly progressing disease, those who are receiving or discontinuing medical therapy to restore bone mass, or have additional risk factors. I have reviewed this report and agree with the above findings. [facility] Electronically Signed   By: Earle Gell M.D.   On: 05/06/2017 15:08    Assessment & Plan:   There are no diagnoses linked to this encounter. I am having Illana T. Perdew maintain her DULoxetine, levothyroxine, cholecalciferol, vitamin B-12, venlafaxine XR, amLODipine, ALPRAZolam, anastrozole, telmisartan, and rosuvastatin.  No orders of the defined types were placed in this encounter.    Follow-up: No follow-ups on file.  Walker Kehr, MD

## 2017-06-13 NOTE — Assessment & Plan Note (Signed)
Xanax

## 2017-06-13 NOTE — Assessment & Plan Note (Signed)
Arimidex - Laina thinks Arimidex is making her "wired" Stop x 1-2 weeks to see if better

## 2017-06-13 NOTE — Assessment & Plan Note (Signed)
Phillis thinks Arimidex is making her "wired" Arimidex: stop it x 1-2 weeks to see if feeling better. Then, call Dr Jana Hakim

## 2017-06-13 NOTE — Patient Instructions (Addendum)
Arimidex: stop it x 1-2 weeks to see if feeling better. Then, call Dr Jana Hakim Crestor: try 1/2 tablet twice a week

## 2017-06-13 NOTE — Assessment & Plan Note (Signed)
[name] [name] wants to come off Crestor: take Crestor 1/2 twice a week

## 2017-06-13 NOTE — Assessment & Plan Note (Signed)
On B12 

## 2017-06-18 ENCOUNTER — Other Ambulatory Visit (INDEPENDENT_AMBULATORY_CARE_PROVIDER_SITE_OTHER): Payer: Medicare Other

## 2017-06-18 DIAGNOSIS — Z170 Estrogen receptor positive status [ER+]: Secondary | ICD-10-CM

## 2017-06-18 DIAGNOSIS — Z0000 Encounter for general adult medical examination without abnormal findings: Secondary | ICD-10-CM | POA: Diagnosis not present

## 2017-06-18 DIAGNOSIS — I10 Essential (primary) hypertension: Secondary | ICD-10-CM | POA: Diagnosis not present

## 2017-06-18 DIAGNOSIS — E785 Hyperlipidemia, unspecified: Secondary | ICD-10-CM | POA: Diagnosis not present

## 2017-06-18 DIAGNOSIS — E890 Postprocedural hypothyroidism: Secondary | ICD-10-CM

## 2017-06-18 DIAGNOSIS — C50412 Malignant neoplasm of upper-outer quadrant of left female breast: Secondary | ICD-10-CM

## 2017-06-18 LAB — HEPATIC FUNCTION PANEL
ALBUMIN: 4.3 g/dL (ref 3.5–5.2)
ALK PHOS: 116 U/L (ref 39–117)
ALT: 15 U/L (ref 0–35)
AST: 22 U/L (ref 0–37)
Bilirubin, Direct: 0.1 mg/dL (ref 0.0–0.3)
TOTAL PROTEIN: 7.6 g/dL (ref 6.0–8.3)
Total Bilirubin: 0.6 mg/dL (ref 0.2–1.2)

## 2017-06-18 LAB — CBC WITH DIFFERENTIAL/PLATELET
BASOS ABS: 0 10*3/uL (ref 0.0–0.1)
Basophils Relative: 0.3 % (ref 0.0–3.0)
EOS ABS: 0.1 10*3/uL (ref 0.0–0.7)
EOS PCT: 1.6 % (ref 0.0–5.0)
HCT: 39.6 % (ref 36.0–46.0)
Hemoglobin: 13.6 g/dL (ref 12.0–15.0)
LYMPHS ABS: 2.2 10*3/uL (ref 0.7–4.0)
Lymphocytes Relative: 30.4 % (ref 12.0–46.0)
MCHC: 34.3 g/dL (ref 30.0–36.0)
MCV: 98.5 fl (ref 78.0–100.0)
MONO ABS: 0.8 10*3/uL (ref 0.1–1.0)
Monocytes Relative: 10.9 % (ref 3.0–12.0)
NEUTROS PCT: 56.8 % (ref 43.0–77.0)
Neutro Abs: 4.2 10*3/uL (ref 1.4–7.7)
PLATELETS: 276 10*3/uL (ref 150.0–400.0)
RBC: 4.02 Mil/uL (ref 3.87–5.11)
RDW: 13.5 % (ref 11.5–15.5)
WBC: 7.3 10*3/uL (ref 4.0–10.5)

## 2017-06-18 LAB — BASIC METABOLIC PANEL
BUN: 20 mg/dL (ref 6–23)
CHLORIDE: 105 meq/L (ref 96–112)
CO2: 26 meq/L (ref 19–32)
Calcium: 9.8 mg/dL (ref 8.4–10.5)
Creatinine, Ser: 1.14 mg/dL (ref 0.40–1.20)
GFR: 48.97 mL/min — ABNORMAL LOW (ref >60.00)
Glucose, Bld: 99 mg/dL (ref 70–99)
POTASSIUM: 4.9 meq/L (ref 3.5–5.1)
Sodium: 140 mEq/L (ref 135–145)

## 2017-06-18 LAB — VITAMIN D 25 HYDROXY (VIT D DEFICIENCY, FRACTURES): VITD: 29.24 ng/mL — AB (ref 30.00–100.00)

## 2017-06-18 LAB — TSH: TSH: 2.16 u[IU]/mL (ref 0.35–4.50)

## 2017-06-19 ENCOUNTER — Other Ambulatory Visit: Payer: Self-pay | Admitting: Internal Medicine

## 2017-06-19 MED ORDER — VITAMIN D 1000 UNITS PO TABS: 2000.0000 [IU] | ORAL_TABLET | Freq: Every day | ORAL | 3 refills | Status: DC

## 2017-06-19 MED ORDER — VITAMIN D3 1.25 MG (50000 UT) PO CAPS: 1.0000 | ORAL_CAPSULE | ORAL | 0 refills | Status: DC

## 2017-06-20 ENCOUNTER — Ambulatory Visit: Payer: Self-pay | Admitting: *Deleted

## 2017-06-20 NOTE — Telephone Encounter (Signed)
Pt called because she is having some anxiety from losing her dog. She has been on alprazolam. She felt better after talking about her dog. No panic attack. She denies any other symptoms.  She did state that she was having some leg pains in the morning but that was because of the drug she is taking for breast cancer prescribed by Dr. Jana Hakim. She will talk with him about that medication (anastrozole).  Pt given home care advice. Pt voiced understanding and will call back for any worsening symptoms.  Reason for Disposition . Normal anxiety  Answer Assessment - Initial Assessment Questions 1. CONCERN: "What happened that made you call today?"     anxiety 2. ANXIETY SYMPTOM SCREENING: "Can you describe how you have been feeling?"  (e.g., tense, restless, panicky, anxious, keyed up, trouble sleeping, trouble concentrating)     anxious 3. ONSET: "How long have you been feeling this way?"     Since been on the medication anastrozole 4. RECURRENT: "Have you felt this way before?"  If yes: "What happened that time?" "What helped these feelings go away in the past?"      When her father passed in 1999 5. RISK OF HARM - SUICIDAL IDEATION:  "Do you ever have thoughts of hurting or killing yourself?"  (e.g., yes, no, no but preoccupation with thoughts about death)   - INTENT:  "Do you have thoughts of hurting or killing yourself right NOW?" (e.g., yes, no, N/A)   - PLAN: "Do you have a specific plan for how you would do this?" (e.g., gun, knife, overdose, no plan, N/A)     no 6. RISK OF HARM - HOMICIDAL IDEATION:  "Do you ever have thoughts of hurting or killing someone else?"  (e.g., yes, no, no but preoccupation with thoughts about death)   - INTENT:  "Do you have thoughts of hurting or killing someone right NOW?" (e.g., yes, no, N/A)   - PLAN: "Do you have a specific plan for how you would do this?" (e.g., gun, knife, no plan, N/A)      no 7. FUNCTIONAL IMPAIRMENT: "How have things been going for you  overall in your life? Have you had any more difficulties than usual doing your normal daily activities?"  (e.g., better, same, worse; self-care, school, work, interactions)     Was doing good when started this medication and her dog was going down and she had to do something. About 2 weeks. 8. SUPPORT: "Who is with you now?" "Who do you live with?" "Do you have family or friends nearby who you can talk to?"      Husband  46. THERAPIST: "Do you have a counselor or therapist? Name?"     no 10. STRESSORS: "Has there been any new stress or recent changes in your life?"       Losing her dog, Fuzzy 11. CAFFEINE ABUSE: "Do you drink caffeinated beverages, and how much each day?" (e.g., coffee, tea, colas)       No  12. SUBSTANCE ABUSE: "Do you use any illegal drugs or alcohol?"       no 13. OTHER SYMPTOMS: "Do you have any other physical symptoms right now?" (e.g., chest pain, palpitations, difficulty breathing, fever)       no 14. PREGNANCY: "Is there any chance you are pregnant?" "When was your last menstrual period?"       no  Protocols used: ANXIETY AND PANIC ATTACK-A-AH

## 2017-06-20 NOTE — Telephone Encounter (Signed)
fyi

## 2017-06-23 NOTE — Telephone Encounter (Signed)
noted 

## 2017-07-09 ENCOUNTER — Encounter: Payer: Self-pay | Admitting: Family

## 2017-07-09 ENCOUNTER — Ambulatory Visit (INDEPENDENT_AMBULATORY_CARE_PROVIDER_SITE_OTHER)
Admission: RE | Admit: 2017-07-09 | Discharge: 2017-07-09 | Disposition: A | Discharge status: Home / Self Care | Payer: Medicare Other | Source: Ambulatory Visit | Attending: Family | Admitting: Family

## 2017-07-09 ENCOUNTER — Ambulatory Visit (INDEPENDENT_AMBULATORY_CARE_PROVIDER_SITE_OTHER): Payer: Medicare Other | Admitting: Family

## 2017-07-09 ENCOUNTER — Other Ambulatory Visit: Payer: Self-pay | Admitting: Family

## 2017-07-09 VITALS — BP 118/72 | HR 96 | Temp 97.8°F | Ht 64.0 in | Wt 144.0 lb

## 2017-07-09 DIAGNOSIS — R0602 Shortness of breath: Secondary | ICD-10-CM | POA: Diagnosis not present

## 2017-07-09 DIAGNOSIS — N63 Unspecified lump in unspecified breast: Secondary | ICD-10-CM

## 2017-07-09 DIAGNOSIS — R079 Chest pain, unspecified: Secondary | ICD-10-CM | POA: Diagnosis not present

## 2017-07-09 DIAGNOSIS — Z853 Personal history of malignant neoplasm of breast: Secondary | ICD-10-CM | POA: Diagnosis not present

## 2017-07-09 NOTE — Progress Notes (Signed)
[name] [name] is a 77 y.o. female with the following history as recorded in [employer]:  Patient Active Problem List   Diagnosis Date Noted  . Osteoporosis 05/07/2017  . Osteopenia 02/19/2017  . Genetic testing 12/05/2016  . Family history of breast cancer   . Port catheter in place 10/23/2016  . Encounter for antineoplastic chemotherapy 10/23/2016  . Malignant neoplasm of upper-outer quadrant of left breast in female, estrogen receptor positive ([place]) 07/29/2016  . Well adult exam 06/05/2015  . Neoplasm of uncertain behavior of skin 06/05/2015  . Adenopathy, cervical 06/05/2015  . Tachycardia 06/07/2014  . Fatigue 06/07/2014  . Wart viral 10/18/2011  . Hyperglycemia 11/06/2010  . Actinic keratoses 05/25/2010  . Essential hypertension 02/23/2010  . BREAST MASS 12/12/2009  . SHOULDER PAIN 11/22/2009  . Chronic fatigue 11/08/2009  . RLQ PAIN 11/08/2009  . VERTIGO 07/11/2009  . ECZEMA 10/19/2008  . CT, CHEST, ABNORMAL 05/30/2008  . STYE 02/17/2008  . B12 deficiency 12/16/2007  . Vitamin D deficiency 12/16/2007  . HYPOTHYROIDISM, POSTSURGICAL 12/01/2007  . Acute maxillary sinusitis 11/11/2007  . ALLERGIC RESPIRATORY DISEASE, EXTRINSIC 11/11/2007  . THYROID CANCER 08/19/2007  . Dyslipidemia 08/19/2007  . Anxiety state 08/19/2007  . DEPRESSION 08/19/2007  . ALLERGIC RHINITIS 08/19/2007  . BREAST CANCER, HX OF 08/19/2007    Current Outpatient Medications  Medication Sig Dispense Refill  . ALPRAZolam (XANAX) 1 MG tablet Take 0.5-1 tablets (0.5-1 mg total) by mouth 2 (two) times daily as needed for anxiety. 60 tablet 3  . amLODipine (NORVASC) 5 MG tablet Take 1 tablet (5 mg total) by mouth daily. 90 tablet 3  . anastrozole (ARIMIDEX) 1 MG tablet Take 1 tablet (1 mg total) by mouth daily. 90 tablet 4  . cholecalciferol (VITAMIN D) 1000 units tablet Take 2 tablets (2,000 Units total) by mouth daily. 100 tablet 3  . Cholecalciferol (VITAMIN D3) 50000 units CAPS Take 1 capsule by  mouth once a week. 6 capsule 0  . DULoxetine (CYMBALTA) 60 MG capsule Take 60 mg by mouth every morning.     [name] levothyroxine (SYNTHROID, LEVOTHROID) 112 MCG tablet TAKE 1 TABLET (112 MCG TOTAL) BY MOUTH DAILY. 90 tablet 3  . rosuvastatin (CRESTOR) 20 MG tablet As directed 30 tablet 5  . telmisartan (MICARDIS) 40 MG tablet Take 1 tablet (40 mg total) by mouth daily. 90 tablet 3  . venlafaxine XR (EFFEXOR-XR) 75 MG 24 hr capsule Take 1 capsule (75 mg total) daily with breakfast by mouth. 30 capsule 6  . vitamin B-12 (CYANOCOBALAMIN) 1000 MCG tablet Take 1,000 mcg by mouth every other day.     No current facility-administered medications for this visit.     Allergies: Pneumococcal vaccines; Sulfa antibiotics; Sulfacetamide sodium-sulfur; Tape; and Tegaderm ag mesh [silver]  Past Medical History:  Diagnosis Date  . Allergic rhinitis   . Anxiety   . Breast cancer ([place]) hx 2004   recurrent 2006 DR Magrinat  . Family history of breast cancer   . Genetic testing 12/05/2016   Multi-Cancer panel (83 genes) @ [facility] - Monoallelic mutation in [place] (carrier)  . HTN (hypertension)   . Hyperlipidemia   . Hypothyroidism   . Personal history of chemotherapy 2002  . Personal history of radiation therapy 2002  . Psoriasis   . S/P thyroidectomy 07/2005   2 cm largest diameter (1 other tiny focus)/ i-131 rx 99 [facility] 08/2005  . Thyroid cancer ([place])    Papillary Stage 1 - Dr Loanne Drilling  . Vitamin B12 deficiency  2009  . Vitamin D deficiency 2009    Past Surgical History:  Procedure Laterality Date  . BREAST LUMPECTOMY    . BREAST LUMPECTOMY WITH RADIOACTIVE SEED AND SENTINEL LYMPH NODE BIOPSY Left 09/11/2016   Procedure: LEFT BREAST LUMPECTOMY WITH RADIOACTIVE SEED AND LEFT AXILLARY SENTINEL LYMPH NODE BIOPSY WITH BLUE DYE INJECTION;  Surgeon: [name], MD;  Location: [place];  Service: General;  Laterality: Left;  . IR FLUORO GUIDE PORT INSERTION RIGHT  09/13/2016  . IR REMOVAL TUN  ACCESS W/ PORT W/O FL MOD SED  12/30/2016  . IR US GUIDE VASC ACCESS RIGHT  09/13/2016  . MASTECTOMY     Right  . PORTACATH PLACEMENT N/A 09/11/2016   Procedure: ATTEMPTED INSERTION PORT-A-CATH WITH ULTRA SOUND GUIDANCE;  Surgeon: [name], MD;  Location: [place];  Service: General;  Laterality: N/A;  . REDUCTION MAMMAPLASTY Left 2005  . THYROIDECTOMY  2007    Family History  Problem Relation Age of Onset  . Stroke Mother   . Allergies Mother   . Asthma Mother   . Clotting disorder Mother   . Heart disease Father 49       MI  . Allergies Sister   . Asthma Sister   . Breast cancer Sister 17       Lymphoma 70; currently 18  . Asthma Brother   . Leukemia Brother        dx 29s  . Allergies Daughter   . Asthma Daughter   . Allergies Sister   . Breast cancer Sister 88       currently 11  . Cancer Paternal Aunt        kidney ca; deceased 88  . Cancer Paternal Uncle        unk. primary ("liver")  . Throat cancer Maternal Grandmother        deceased 35  . Lung cancer Maternal Grandfather        deceased 41  . Pancreatic cancer Paternal Grandfather        deceased 71  . Cancer Paternal Aunt        "abdominal"; deceased 72    Social History   Tobacco Use  . Smoking status: Never Smoker  . Smokeless tobacco: Never Used  Substance Use Topics  . Alcohol use: No    Subjective:  Patient had a lumpectomy in August 2018 and left axillary lymph node removed; today presents with concerns about scar tissue at the site of the lumpectomy. Notes that area has been sore since the time of the surgery; in the past week, patient lost her dog of 18 years- admits she is having increased anxiety since that time.  She discussed area of concern with her oncologist in the past 2 months- was told the area was a seroma; however, she feels the area is getting larger in size and has become more painful in the past few weeks.   She also mentions that she just doesn't feel like she  is able to take a deep breath as easily recently; denies any chest pain on exertion; denies any cough or fever; Normal EKG done in January 2019;   Objective:  Vitals:   07/09/17 1001  BP: 118/72  Pulse: 96  Temp: 97.8 F (36.6 C)  TempSrc: Oral  SpO2: 98%  Weight: 144 lb 0.6 oz (65.3 kg)  Height: 5\' 4"  (1.626 m)    General: Well developed, well nourished, in no acute distress  Skin :  Warm and dry.  Head: Normocephalic and atraumatic  Lungs: Respirations unlabored; clear to auscultation bilaterally without wheeze, rales, rhonchi  Vessels: Symmetric bilaterally  Neurologic: Alert and oriented; speech intact; face symmetrical; moves all extremities well; CNII-XII intact without focal deficit  Breast exam- firm localized mass noted at 9:00 position left breast; no streaking; normal lymph nodes Assessment:  1. Shortness of breath   2. Breast lump in female   3. BREAST CANCER, HX OF     Plan:  1. Update CXR today; ? Deconditioning; follow-up to be determined.  2. ? Seroma; patient feels area increasing in size and becoming more painful; update diagnostic mammogram and ultrasound; follow-up to be determined.  No follow-ups on file.  Orders Placed This Encounter  Procedures  . DG Chest 2 View    Standing Status:   Future    Number of Occurrences:   1    Standing Expiration Date:   09/09/2018    Order Specific Question:   Reason for Exam (SYMPTOM  OR DIAGNOSIS REQUIRED)    Answer:   shortness of breath    Order Specific Question:   Preferred imaging location?    Answer:   [facility]    Order Specific Question:   Radiology Contrast Protocol - do NOT remove file path    Answer:   [email or web address]  . US BREAST LTD UNI LEFT INC AXILLA    Standing Status:   Future    Standing Expiration Date:   09/09/2018    Order Specific Question:   Reason for Exam (SYMPTOM  OR DIAGNOSIS REQUIRED)    Answer:   breast lump at 9:00 position    Order  Specific Question:   Preferred imaging location?    Answer:   [facility]  . MM DIAG BREAST TOMO UNI LEFT    Standing Status:   Future    Standing Expiration Date:   09/09/2018    Order Specific Question:   Reason for Exam (SYMPTOM  OR DIAGNOSIS REQUIRED)    Answer:   breast lump; history of breast cancer    Order Specific Question:   Preferred imaging location?    Answer:   [facility]    Requested Prescriptions    No prescriptions requested or ordered in this encounter

## 2017-07-14 ENCOUNTER — Ambulatory Visit (INDEPENDENT_AMBULATORY_CARE_PROVIDER_SITE_OTHER)
Admission: RE | Admit: 2017-07-14 | Discharge: 2017-07-14 | Disposition: A | Discharge status: Home / Self Care | Payer: Medicare Other | Source: Ambulatory Visit | Attending: Family | Admitting: Family

## 2017-07-14 DIAGNOSIS — R0602 Shortness of breath: Secondary | ICD-10-CM

## 2017-07-14 DIAGNOSIS — J8410 Pulmonary fibrosis, unspecified: Secondary | ICD-10-CM | POA: Diagnosis not present

## 2017-07-14 MED ORDER — IOPAMIDOL (ISOVUE-300) INJECTION 61%
80.0000 mL | Freq: Once | INTRAVENOUS | Status: AC | PRN
  Administered 2017-07-14: 80 mL via INTRAVENOUS

## 2017-07-15 ENCOUNTER — Telehealth: Payer: Self-pay | Admitting: Internal Medicine

## 2017-07-15 ENCOUNTER — Other Ambulatory Visit: Payer: Self-pay | Admitting: Family

## 2017-07-15 DIAGNOSIS — R059 Cough, unspecified: Secondary | ICD-10-CM

## 2017-07-15 DIAGNOSIS — R05 Cough: Secondary | ICD-10-CM

## 2017-07-15 NOTE — Telephone Encounter (Signed)
Call back number: [phone]  Reason for call:   Patient inquiring CT CHEST W CONTRAST results, patient going out of town tomorrow and would like to know the status, please advise

## 2017-07-15 NOTE — Telephone Encounter (Signed)
Please advise. Thanks.  

## 2017-07-16 ENCOUNTER — Other Ambulatory Visit: Payer: Self-pay

## 2017-07-27 NOTE — Progress Notes (Signed)
. [name]   DOB: 1940-08-11  MR#: [id number]  CSN#:[id number]  PCP: [name] Anger, MD Patient Care Team: [name] Anger, MD as PCP - General [name], [name] Dad, MD as Consulting Physician (Oncology) [name] [name], MD as Consulting Physician (General Surgery) [name] Campbell, MD as Consulting Physician (Gastroenterology) [name] Rudd, MD as Consulting Physician (Radiation Oncology)   CHIEF COMPLAINT: Weakly estrogen receptor positive breast cancer  CURRENT TREATMENT: Anastrozole   INTERVAL HISTORY: [name] [name] returns today for follow-up and treatment of her weakly estrogen receptor positive breast cancer. She continues on anastrozole, with good tolerance. She has frequent hot flashes, but they aren't intense. She obtains this at a good price.    She completed a chest Xray on 07/09/2017 showing: No definite acute cardiopulmonary abnormality. Pleuroparenchymal changes peripherally in the right upper lobe are likely stable. Scoliosis convex toward the right centered in the lower thoracic spine.  She also completed a chest CT on 07/14/2017 showing: Mild subpleural fibrosis may be due to nonspecific interstitial pneumonitis or usual interstitial pneumonitis. Aortic atherosclerosis (ICD10-170.0). Coronary artery calcification   REVIEW OF SYSTEMS: [name] [name] reports that she is very sad that her Dachshund dog passed away after 18 years.  She hasn't been able to cry, and she would feel better is she could. She does not regularly exercise. She notes that she wishes she could see her granddaughter who lives past [place]. She denies unusual headaches, visual changes, nausea, vomiting, or dizziness. There has been no unusual cough, phlegm production, or pleurisy. This been no change in bowel or bladder habits. She denies unexplained fatigue or unexplained weight loss, bleeding, rash, or fever. A detailed review of systems was otherwise stable.    BREAST CANCER HISTORY: From the recent  summary note:  I last saw [name] [name] in 2013 when she was released from follow-up from her right breast cancer recurrence in 2004. She is status post right mastectomy and adjuvant chemotherapy for that triple negative tumor. More recently, on 07/16/2016 she underwent left screening mammography at the [facility] on 07/16/2016. This found the breast density to be category B. There was a possible asymmetry in the left breast, and on 07/19/2016 she underwent left diagnostic mammography with tomography and left breast ultrasonography. This confirmed a well circumscribed oval mass at the 9:00 position of the left breast measuring approximately 0.8 cm. This was not palpable. On ultrasonography the mass measured 0.6 cm and it was located at the 9:00 radiant 4 cm from the nipple. The left axilla was sonographically benign.  On June 11 [name] [name] underwent biopsy of the left breast mass in question and this showed (SAA [id number]) and invasive ductal carcinoma, grade 3, estrogen receptor 5% positive with weak staining intensity, progesterone receptor negative, with an MIB-1 of 80%, and HER-2 nonamplified, the signals ratio being 1.46 and the number per cell 1.90.  Her subsequent history is as detailed below.   PAST MEDICAL HISTORY: Past Medical History:  Diagnosis Date  . Allergic rhinitis   . Anxiety   . Breast cancer ([name] [name]) hx 2004   recurrent 2006 [name]  . Family history of breast cancer   . 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 HTN (hypertension)   . Hyperlipidemia   . Hypothyroidism   . Personal history of chemotherapy 2002  . Personal history of radiation therapy 2002  . Psoriasis   . S/P thyroidectomy 07/2005   2 cm largest diameter (1  other tiny focus)/ i-131 rx 99 [facility]ci 08/2005  . Thyroid cancer ([name] [name])    Papillary Stage 1 - [name] [name]  . Vitamin B12 deficiency 2009  . Vitamin D deficiency 2009    PAST SURGICAL  HISTORY: Past Surgical History:  Procedure Laterality Date  . BREAST LUMPECTOMY    . BREAST LUMPECTOMY WITH RADIOACTIVE SEED AND SENTINEL LYMPH NODE BIOPSY Left 09/11/2016   Procedure: LEFT BREAST LUMPECTOMY WITH RADIOACTIVE SEED AND LEFT AXILLARY SENTINEL LYMPH NODE BIOPSY WITH BLUE DYE INJECTION;  Surgeon: [name] [name], MD;  Location: [name];  Service: General;  Laterality: Left;  . IR FLUORO GUIDE [name] INSERTION RIGHT  09/13/2016  . IR REMOVAL TUN ACCESS W/ [name] W/O FL MOD SED  12/30/2016  . IR US GUIDE VASC ACCESS RIGHT  09/13/2016  . MASTECTOMY     Right  . PORTACATH PLACEMENT N/A 09/11/2016   Procedure: ATTEMPTED INSERTION [name]-A-CATH WITH ULTRA SOUND GUIDANCE;  Surgeon: [name] [name], MD;  Location: [name];  Service: General;  Laterality: N/A;  . REDUCTION MAMMAPLASTY Left 2005  . THYROIDECTOMY  2007    FAMILY HISTORY Family History  Problem Relation Age of Onset  . Stroke Mother   . Allergies Mother   . Asthma Mother   . Clotting disorder Mother   . Heart disease Father 62       MI  . Allergies Sister   . Asthma Sister   . Breast cancer Sister 43       Lymphoma 35; currently 30  . Asthma Brother   . Leukemia Brother        dx 45s  . Allergies Daughter   . Asthma Daughter   . Allergies Sister   . Breast cancer Sister 42       currently 52  . Cancer Paternal Aunt        kidney ca; deceased 63  . Cancer Paternal Uncle        unk. primary ("liver")  . Throat cancer Maternal Grandmother        deceased 17  . Lung cancer Maternal Grandfather        deceased 78  . Pancreatic cancer Paternal Grandfather        deceased 31  . Cancer Paternal Aunt        "abdominal"; deceased 66  Family history includes a sister, a paternal aunt, and a paternal cousin with breast cancer all older then 16 at diagnosis  GYN HISTORY: Menarche age 29, first live birth age 41. She is GX P3. She went through the change of life age 89. She did not take  hormone replacement.  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 Her husband [name] [name] is retired from [employer]. Her children are [name] [name], who works for time [employer] in [place], and [name] [name], who lives in [place] and works for [employer]. The third child, [name] [name], died in an automobile accident at age 34. The patient has one granddaughter. Her dog [name] [name], just passed away at 77 years old. 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 Smoking status: Never Smoker  . Smokeless tobacco: Never Used  Substance Use Topics  . Alcohol use: No  . Drug use: No     Colonoscopy:  PAP:  Bone density: 05/06/2017 showed a T score of -3.4 osteoporosis  Lipid panel:  Allergies  Allergen Reactions  .  Pneumococcal Vaccines Other (See Comments)    Was really sick   . Sulfa Antibiotics Other (See Comments)    Pt believes it was hives  . Sulfacetamide Sodium-Sulfur   . Tape Itching, Dermatitis and Rash  . Tegaderm Ag Mesh [Silver] Itching and Rash    Current Outpatient Medications  Medication Sig Dispense Refill  . ALPRAZolam (XANAX) 1 MG tablet Take 0.5-1 tablets (0.5-1 mg total) by mouth 2 (two) times daily as needed for anxiety. 60 tablet 3  . amLODipine (NORVASC) 5 MG tablet Take 1 tablet (5 mg total) by mouth daily. 90 tablet 3  . anastrozole (ARIMIDEX) 1 MG tablet Take 1 tablet (1 mg total) by mouth daily. 90 tablet 4  . cholecalciferol (VITAMIN D) 1000 units tablet Take 2 tablets (2,000 Units total) by mouth daily. 100 tablet 3  . Cholecalciferol (VITAMIN D3) 50000 units CAPS Take 1 capsule by mouth once a week. 6 capsule 0  . DULoxetine (CYMBALTA) 60 MG capsule Take 60 mg by mouth every morning.     [name] levothyroxine (SYNTHROID, LEVOTHROID) 112 MCG tablet TAKE 1 TABLET (112 MCG TOTAL) BY MOUTH DAILY. 90 tablet 3  . rosuvastatin (CRESTOR) 20 MG tablet As directed 30 tablet 5  .  telmisartan (MICARDIS) 40 MG tablet Take 1 tablet (40 mg total) by mouth daily. 90 tablet 3  . venlafaxine XR (EFFEXOR-XR) 75 MG 24 hr capsule Take 1 capsule (75 mg total) daily with breakfast by mouth. 30 capsule 6  . vitamin B-12 (CYANOCOBALAMIN) 1000 MCG tablet Take 1,000 mcg by mouth every other day.     No current facility-administered medications for this visit.     OBJECTIVE: Older white woman who appears stated age  59:   07/28/17 1137  BP: 120/88  Pulse: 95  Resp: 18  Temp: 97.8 F (36.6 C)  SpO2: 99%     Body mass index is 24.58 kg/m.    ECOG FS: 1  Sclerae unicteric, pupils round and equal Oropharynx clear and moist No cervical or supraclavicular adenopathy Lungs no rales or rhonchi Heart regular rate and rhythm Abd soft, nontender, positive bowel sounds MSK no focal spinal tenderness, no upper extremity lymphedema Neuro: nonfocal, well oriented, sad affect Breasts: The right breast has undergone lumpectomy and radiation.  There is no evidence of local recurrence.  The left breast has undergone lumpectomy and radiation.  There continues to be a seroma in the upper inner quadrant.  This is unchanged from baseline.  Both axillae are benign.  LAB RESULTS: Lab Results  Component Value Date   WBC 3.8 (L) 07/28/2017   NEUTROABS 1.8 07/28/2017   HGB 13.5 07/28/2017   HCT 39.9 07/28/2017   MCV 97.2 07/28/2017   PLT 252 07/28/2017      Chemistry      Component Value Date/Time   NA 140 06/18/2017 0805   NA 139 12/11/2016 1017   K 4.9 06/18/2017 0805   K 3.7 12/11/2016 1017   CL 105 06/18/2017 0805   CL 104 10/09/2011 1403   CO2 26 06/18/2017 0805   CO2 22 12/11/2016 1017   BUN 20 06/18/2017 0805   BUN 16.9 12/11/2016 1017   CREATININE 1.14 06/18/2017 0805   CREATININE 1.2 (H) 12/11/2016 1017      Component Value Date/Time   CALCIUM 9.8 06/18/2017 0805   CALCIUM 9.0 12/11/2016 1017   ALKPHOS 116 06/18/2017 0805   ALKPHOS 139 12/11/2016 1017   AST 22  06/18/2017 0805   AST 65 (  H) 12/11/2016 1017   ALT 15 06/18/2017 0805   ALT 51 12/11/2016 1017   BILITOT 0.6 06/18/2017 0805   BILITOT 0.42 12/11/2016 1017       Lab Results  Component Value Date   LABCA2 16 10/09/2011    No components found for: [id number]  No results for input(s): INR in the last 168 hours.  Urinalysis    Component Value Date/Time   COLORURINE YELLOW 06/05/2015 1559   APPEARANCEUR CLEAR 06/05/2015 1559   LABSPEC 1.020 06/05/2015 1559   PHURINE 5.5 06/05/2015 1559   GLUCOSEU NEGATIVE 06/05/2015 1559   HGBUR NEGATIVE 06/05/2015 1559   BILIRUBINUR NEGATIVE 06/05/2015 1559   KETONESUR NEGATIVE 06/05/2015 1559   UROBILINOGEN 0.2 06/05/2015 1559   NITRITE NEGATIVE 06/05/2015 1559   LEUKOCYTESUR MODERATE (A) 06/05/2015 1559    STUDIES: Dg Chest 2 View  Result Date: 07/09/2017 CLINICAL DATA:  Three-week history of mid chest pain radiating to the back with increased severity over the past week. History of breast and thyroid malignancy and hypertension. Nonsmoker. EXAM: CHEST - 2 VIEW COMPARISON:  Portable chest x-ray of September 11, 2016, fluoro spot view of the right chest of September 13, 2016 and chest CT scan of May 17, 2008. FINDINGS: The lungs are adequately inflated. There is no focal infiltrate. There is subtle soft tissue density projecting over the lateral aspects of the right sixth and seventh ribs which is not entirely new when compared to the CT scan of April 2010. There are postsurgical changes in the right breast. The heart and pulmonary vascularity are normal. There is calcification in the wall of the aortic arch. There is stable dextrocurvature centered in the lower thoracic spine. There is no pleural effusion. IMPRESSION: No definite acute cardiopulmonary abnormality. Pleuroparenchymal changes peripherally in the right upper lobe are likely stable. Scoliosis convex toward the right centered in the lower thoracic spine. Given the patient's history of breast  and thyroid malignancy, the pleuroparenchymal findings on the right, and the current symptoms, chest CT scanning is recommended to exclude malignancy. Electronically Signed   By: David  Martinique M.D.   On: 07/09/2017 12:52   Ct Chest W Contrast  Result Date: 07/14/2017 CLINICAL DATA:  Persistent productive cough. EXAM: CT CHEST WITH CONTRAST TECHNIQUE: Multidetector CT imaging of the chest was performed during intravenous contrast administration. CONTRAST:  17m ISOVUE-300 IOPAMIDOL (ISOVUE-300) INJECTION 61% COMPARISON:  Chest radiograph 07/09/2017 and CT chest 05/17/2008. FINDINGS: Cardiovascular: Atherosclerotic calcification of the arterial vasculature, including coronary arteries. There may be a broad-based ulcerative plaque along the transverse aorta. Heart size normal. No pericardial effusion. Mediastinum/Nodes: Thyroidectomy. No pathologically enlarged mediastinal, hilar or axillary lymph nodes. Surgical clips in the right axilla. Esophagus is grossly unremarkable. Lungs/Pleura: Biapical pleuroparenchymal scarring. Mild subpleural reticulation and ground-glass, without a definite zonal predominance. Pleuroparenchymal scarring in the lateral right hemithorax. Subpleural radiation fibrosis in the anterior hemi thoraces bilaterally. No pleural fluid. Airway is unremarkable. Upper Abdomen: Visualized portions of the liver, gallbladder and right adrenal gland are unremarkable. Nodular thickening of the left adrenal gland. 2.6 cm low-attenuation lesion in the upper pole right kidney, likely a cyst. Partially imaged low-attenuation lesion in the left renal hilum. Visualized portions of the spleen, pancreas, stomach and bowel are grossly unremarkable. No upper abdominal adenopathy. Musculoskeletal: Degenerative changes in the spine. No worrisome lytic or sclerotic lesions. IMPRESSION: 1. Mild subpleural fibrosis may be due to nonspecific interstitial pneumonitis or usual interstitial pneumonitis. 2. Aortic  atherosclerosis (ICD10-170.0). Coronary artery calcification. Electronically Signed  By: Lorin Picket M.D.   On: 07/14/2017 09:32     ASSESSMENT: 77 y.o. [place] woman   (1) status post right lumpectomy and sentinel lymph node biopsy in September 2002 for multifocal breast carcinoma, triple negative.  Treated with CMF followed by radiation.   (2) Local recurrence in April 2004, status post right modified radical mastectomy with TRAM reconstruction for what proved to be a T1c N0, stage IA triple negative breast carcinoma.  Treated adjuvantly with paclitaxel and doxorubicin x4 in 2004.  Off treatment since August 2004 with no evidence of recurrence  (3) history of papillary thyroid cancer s/p thyroidectomy June 2007, s/p radioactive iodine July 2007  (4) status post left breast upper outer quadrant biopsy 08/21/2016 for a clinical T1b N0, stage IB invasive ductal carcinoma, grade 3, weakly estrogen receptor positive, progesterone receptor and HER-2 negative, with an MIB-1 of 80%.  (5) left lumpectomy and sentinel lymph node sampling 09/11/2016 found a pT1b pN0, stage IB invasive ductal carcinoma, grade 3, with negative margins.  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.Gln90*)  (a) there were no deleterious mutations in ALK, APC, ATM, AXIN2, BAP1, BARD1, BLM, BMPR1A, BRCA1, BRCA2, BRIP1, CASR, CDC73, CDH1, CDK4, CDKN1B, CDKN1C, CDKN2A, CEBPA, CHEK2, CTNNA1, DICER1, DIS3L2, EGFR, EPCAM, FH, FLCN, GATA2, GPC3, GREM1, HOXB13,  HRAS, KIT, MAX, MEN1, MET, MITF, MLH1, MSH2, MSH3, MSH6, MUTYH, NBN, NF1, NF2, NTHL1, PALB2, PDGFRA, PHOX2B, PMS2, POLD1, POLE, POT1, PRKAR1A, PTCH1, PTEN, RAD50, RAD51C, RAD51D, RB1, RECQL4, RET, RUNX1, SDHA, SDHAF2, SDHB, SDHC, SDHD, SMAD4, SMARCA4, SMARCB1, SMARCE1, STK11, SUFU, TERC, TERT, TMEM127, TP53, TSC1, TSC2, VHL, WRN, WT1  (b) while homozygous mutations in the NPH L1 gene are associated with autosomal recessive polyposis, there is no evidence to suggest that a single mutation in this gene increases cancer risk.  (9) to start anastrozole 03/14/2017  (a) bone density on 05/06/2017 showed a T score of -3.4--osteoporosis  (b) to start denosumab/Prolia July 28, 2017   PLAN: s  PeSusanill soon be a year out from definitive surgery for her breast cancer with no evidence of disease recurrence.  This is favorable.  She is tolerating anastrozole well and the plan will be to continue that a total of 5 years.  We again discussed Prolia.  She understands the possible toxicity side effects and complications.  She will receive her first dose today.  I have asked her to take some extra calcium to avoid the transient hypocalcemia problems.  If she has bony aches and pains for the next couple of days she will let usKoreanow  She is grieving her dog.  I think she is ready to go straight from here to the pound to rescue another four-legged friend.  Otherwise she will see me again in 6 months for her next Prolia dose.  She knows to call for any other problems that may develop before then.   Bathsheba Durrett, GuVirgie DadMD  07/28/17 11:46 AM Medical Oncology [facility] H[facility] [id number].  [phone]    Fax. [phone]  Alice Rieger, am acting as scribe for Chauncey Cruel MD.  I, Lurline Del MD, have reviewed the above documentation for accuracy and completeness, and I agree with the above.

## 2017-07-28 ENCOUNTER — Inpatient Hospital Stay: Payer: Medicare Other

## 2017-07-28 ENCOUNTER — Inpatient Hospital Stay: Payer: Medicare Other | Attending: Oncology | Admitting: Oncology

## 2017-07-28 ENCOUNTER — Telehealth: Payer: Self-pay | Admitting: Oncology

## 2017-07-28 VITALS — BP 120/88 | HR 95 | Temp 97.8°F | Resp 18 | Ht 64.0 in | Wt 143.2 lb

## 2017-07-28 DIAGNOSIS — Z85850 Personal history of malignant neoplasm of thyroid: Secondary | ICD-10-CM | POA: Insufficient documentation

## 2017-07-28 DIAGNOSIS — C50412 Malignant neoplasm of upper-outer quadrant of left female breast: Secondary | ICD-10-CM | POA: Diagnosis not present

## 2017-07-28 DIAGNOSIS — E559 Vitamin D deficiency, unspecified: Secondary | ICD-10-CM | POA: Diagnosis not present

## 2017-07-28 DIAGNOSIS — E785 Hyperlipidemia, unspecified: Secondary | ICD-10-CM | POA: Diagnosis not present

## 2017-07-28 DIAGNOSIS — M419 Scoliosis, unspecified: Secondary | ICD-10-CM | POA: Diagnosis not present

## 2017-07-28 DIAGNOSIS — F419 Anxiety disorder, unspecified: Secondary | ICD-10-CM | POA: Insufficient documentation

## 2017-07-28 DIAGNOSIS — Z170 Estrogen receptor positive status [ER+]: Secondary | ICD-10-CM | POA: Diagnosis not present

## 2017-07-28 DIAGNOSIS — E039 Hypothyroidism, unspecified: Secondary | ICD-10-CM | POA: Insufficient documentation

## 2017-07-28 DIAGNOSIS — Z9221 Personal history of antineoplastic chemotherapy: Secondary | ICD-10-CM | POA: Insufficient documentation

## 2017-07-28 DIAGNOSIS — I10 Essential (primary) hypertension: Secondary | ICD-10-CM

## 2017-07-28 DIAGNOSIS — I700 Atherosclerosis of aorta: Secondary | ICD-10-CM | POA: Insufficient documentation

## 2017-07-28 DIAGNOSIS — C50012 Malignant neoplasm of nipple and areola, left female breast: Secondary | ICD-10-CM

## 2017-07-28 DIAGNOSIS — Z9011 Acquired absence of right breast and nipple: Secondary | ICD-10-CM | POA: Diagnosis not present

## 2017-07-28 DIAGNOSIS — R232 Flushing: Secondary | ICD-10-CM | POA: Diagnosis not present

## 2017-07-28 DIAGNOSIS — R079 Chest pain, unspecified: Secondary | ICD-10-CM

## 2017-07-28 DIAGNOSIS — Z79811 Long term (current) use of aromatase inhibitors: Secondary | ICD-10-CM | POA: Insufficient documentation

## 2017-07-28 DIAGNOSIS — Z79899 Other long term (current) drug therapy: Secondary | ICD-10-CM | POA: Insufficient documentation

## 2017-07-28 DIAGNOSIS — E538 Deficiency of other specified B group vitamins: Secondary | ICD-10-CM

## 2017-07-28 DIAGNOSIS — Z923 Personal history of irradiation: Secondary | ICD-10-CM | POA: Insufficient documentation

## 2017-07-28 DIAGNOSIS — Z95828 Presence of other vascular implants and grafts: Secondary | ICD-10-CM

## 2017-07-28 LAB — COMPREHENSIVE METABOLIC PANEL
ALBUMIN: 4.1 g/dL (ref 3.5–5.0)
ALT: 13 U/L (ref 0–55)
AST: 21 U/L (ref 5–34)
Alkaline Phosphatase: 121 U/L (ref 40–150)
Anion gap: 10 (ref 3–11)
BUN: 15 mg/dL (ref 7–26)
CHLORIDE: 104 mmol/L (ref 98–109)
CO2: 25 mmol/L (ref 22–29)
Calcium: 9.7 mg/dL (ref 8.4–10.4)
Creatinine, Ser: 1.26 mg/dL — ABNORMAL HIGH (ref 0.60–1.10)
GFR calc Af Amer: 46 mL/min — ABNORMAL LOW (ref >60)
GFR calc non Af Amer: 40 mL/min — ABNORMAL LOW (ref >60)
GLUCOSE: 139 mg/dL (ref 70–140)
POTASSIUM: 3.9 mmol/L (ref 3.5–5.1)
SODIUM: 139 mmol/L (ref 136–145)
Total Bilirubin: 0.7 mg/dL (ref 0.2–1.2)
Total Protein: 7.5 g/dL (ref 6.4–8.3)

## 2017-07-28 LAB — CBC WITH DIFFERENTIAL/PLATELET
BASOS PCT: 0 %
Basophils Absolute: 0 10*3/uL (ref 0.0–0.1)
EOS ABS: 0.1 10*3/uL (ref 0.0–0.5)
Eosinophils Relative: 3 %
HCT: 39.9 % (ref 34.8–46.6)
HEMOGLOBIN: 13.5 g/dL (ref 11.6–15.9)
LYMPHS ABS: 1.5 10*3/uL (ref 0.9–3.3)
Lymphocytes Relative: 41 %
MCH: 32.8 pg (ref 25.1–34.0)
MCHC: 33.8 g/dL (ref 31.5–36.0)
MCV: 97.2 fL (ref 79.5–101.0)
Monocytes Absolute: 0.3 10*3/uL (ref 0.1–0.9)
Monocytes Relative: 8 %
Neutro Abs: 1.8 10*3/uL (ref 1.5–6.5)
Neutrophils Relative %: 48 %
Platelets: 252 10*3/uL (ref 145–400)
RBC: 4.1 MIL/uL (ref 3.70–5.45)
RDW: 12.8 % (ref 11.2–14.5)
WBC: 3.8 10*3/uL — AB (ref 3.9–10.3)

## 2017-07-28 MED ORDER — DENOSUMAB 60 MG/ML ~~LOC~~ SOSY
60.0000 mg | PREFILLED_SYRINGE | Freq: Once | SUBCUTANEOUS | Status: AC
  Administered 2017-07-28: 60 mg via SUBCUTANEOUS

## 2017-07-28 NOTE — Patient Instructions (Signed)

## 2017-07-28 NOTE — Telephone Encounter (Signed)
Gave patient avs and calendar of upcoming December appointments.  °

## 2017-08-24 ENCOUNTER — Other Ambulatory Visit: Payer: Self-pay | Admitting: Internal Medicine

## 2017-09-10 ENCOUNTER — Ambulatory Visit: Payer: Self-pay | Admitting: *Deleted

## 2017-09-10 ENCOUNTER — Telehealth: Payer: Self-pay | Admitting: Internal Medicine

## 2017-09-10 NOTE — Telephone Encounter (Signed)
Pls advise if ok to refill Cymbalta until appt 09/16/17. Med was rx by another MD../lmb

## 2017-09-10 NOTE — Telephone Encounter (Signed)
See Triage Note also. Appears that this was last filled by a "historical provider".  Patient asked if this could be filled to get her through until her appointment.

## 2017-09-10 NOTE — Telephone Encounter (Signed)
Copied from [place] [id number]. Topic: Inquiry >> Sep 10, 2017  8:45 AM Pricilla Handler wrote: Reason for CRM: Patient called requesting a refill request of DULoxetine (CYMBALTA) 60 MG capsule. Patient is completely out of this medication. Patient's pharmacy has sent two requests for this refill over the last week, but have not received a response. Patient's preferred pharmacy is [id number] [address], [place]. [phone] (Phone)  [phone] (Fax).      Thank You!!!

## 2017-09-10 NOTE — Telephone Encounter (Signed)
Pt called because her prescription for Cymbalta has run out. She has tried to get it refilled but had been denied by the pharmacy. She has been without it now for going on 3 days. She is starting to have some irritability. And sounding anxious.  She wants to know is she can get enough medication to last her until her appointment coming up on August 7 th. Flow at [facility] notified and will send CRM to Dr. Alain Marion . Pt would like a call back, if possible after 4 (she will be back at home). Will route to flow at [facility].   Reason for Disposition . Caller has URGENT medication question about med that PCP prescribed and triager unable to answer question  Answer Assessment - Initial Assessment Questions 1. SYMPTOMS: "Do you have any symptoms?"     irritability  2. SEVERITY: If symptoms are present, ask "Are they mild, moderate or severe?"     mild  Protocols used: MEDICATION QUESTION CALL-A-AH

## 2017-09-11 MED ORDER — DULOXETINE HCL 60 MG PO CPEP: 60.0000 mg | ORAL_CAPSULE | ORAL | 5 refills | Status: DC

## 2017-09-11 NOTE — Telephone Encounter (Signed)
Notified pt w/MD response.../lmb 

## 2017-09-11 NOTE — Telephone Encounter (Signed)
OK to fill #30 w/5 ref. Keep OV Thx

## 2017-09-17 ENCOUNTER — Encounter: Payer: Self-pay | Admitting: Internal Medicine

## 2017-09-17 ENCOUNTER — Ambulatory Visit (INDEPENDENT_AMBULATORY_CARE_PROVIDER_SITE_OTHER): Payer: Medicare Other | Admitting: Internal Medicine

## 2017-09-17 DIAGNOSIS — E538 Deficiency of other specified B group vitamins: Secondary | ICD-10-CM

## 2017-09-17 DIAGNOSIS — I10 Essential (primary) hypertension: Secondary | ICD-10-CM

## 2017-09-17 DIAGNOSIS — N6002 Solitary cyst of left breast: Secondary | ICD-10-CM | POA: Diagnosis not present

## 2017-09-17 DIAGNOSIS — B079 Viral wart, unspecified: Secondary | ICD-10-CM

## 2017-09-17 NOTE — Progress Notes (Signed)
Subjective:  Patient ID: [name] [name], female    DOB: 10-13-1940  Age: 77 y.o. MRN: [id number]  CC: No chief complaint on file.   HPI [name] [name] presents for breast ca - worried about a lump in the L breast  Outpatient Medications Prior to Visit  Medication Sig Dispense Refill  . ALPRAZolam (XANAX) 1 MG tablet Take 0.5-1 tablets (0.5-1 mg total) by mouth 2 (two) times daily as needed for anxiety. 60 tablet 3  . anastrozole (ARIMIDEX) 1 MG tablet Take 1 tablet (1 mg total) by mouth daily. 90 tablet 4  . cholecalciferol (VITAMIN D) 1000 units tablet Take 2 tablets (2,000 Units total) by mouth daily. 100 tablet 3  . Cholecalciferol (VITAMIN D3) 50000 units CAPS TAKE ONE CAPSULE BY MOUTH ONE TIME PER WEEK 6 capsule 1  . DULoxetine (CYMBALTA) 60 MG capsule Take 1 capsule (60 mg total) by mouth every morning. 30 capsule 5  . levothyroxine (SYNTHROID, LEVOTHROID) 112 MCG tablet TAKE 1 TABLET (112 MCG TOTAL) BY MOUTH DAILY. 90 tablet 3  . rosuvastatin (CRESTOR) 20 MG tablet As directed 30 tablet 5  . telmisartan (MICARDIS) 40 MG tablet Take 1 tablet (40 mg total) by mouth daily. 90 tablet 3  . venlafaxine XR (EFFEXOR-XR) 75 MG 24 hr capsule Take 1 capsule (75 mg total) daily with breakfast by mouth. 30 capsule 6  . vitamin B-12 (CYANOCOBALAMIN) 1000 MCG tablet Take 1,000 mcg by mouth every other day.    [name] amLODipine (NORVASC) 5 MG tablet Take 1 tablet (5 mg total) by mouth daily. 90 tablet 3   No facility-administered medications prior to visit.     ROS: Review of Systems  Constitutional: Negative for activity change, appetite change, chills, fatigue and unexpected weight change.  HENT: Negative for congestion, mouth sores and sinus pressure.   Eyes: Negative for visual disturbance.  Respiratory: Negative for cough and chest tightness.   Gastrointestinal: Negative for abdominal pain and nausea.  Genitourinary: Negative for difficulty urinating, frequency and vaginal pain.    Musculoskeletal: Negative for back pain and gait problem.  Skin: Negative for pallor and rash.  Neurological: Negative for dizziness, tremors, weakness, numbness and headaches.  Psychiatric/Behavioral: Negative for confusion and sleep disturbance.  L breast cyst   Objective:  BP 128/76 (BP Location: Left Arm, Patient Position: Sitting, Cuff Size: Normal)   Pulse (!) 106   Temp 98.1 F (36.7 C) (Oral)   Ht 5\' 4"  (1.626 m)   Wt 146 lb (66.2 kg)   SpO2 98%   BMI 25.06 kg/m   BP Readings from Last 3 Encounters:  09/17/17 128/76  07/28/17 120/88  07/09/17 118/72    Wt Readings from Last 3 Encounters:  09/17/17 146 lb (66.2 kg)  07/28/17 143 lb 3.2 oz (65 kg)  07/09/17 144 lb 0.6 oz (65.3 kg)    Physical Exam  Constitutional: She appears well-developed. No distress.  HENT:  Head: Normocephalic.  Right Ear: External ear normal.  Left Ear: External ear normal.  Nose: Nose normal.  Mouth/Throat: Oropharynx is clear and moist.  Eyes: Pupils are equal, round, and reactive to light. Conjunctivae are normal. Right eye exhibits no discharge. Left eye exhibits no discharge.  Neck: Normal range of motion. Neck supple. No JVD present. No tracheal deviation present. No thyromegaly present.  Cardiovascular: Normal rate, regular rhythm and normal heart sounds.  Pulmonary/Chest: No stridor. No respiratory distress. She has no wheezes.  Abdominal: Soft. Bowel sounds are normal. She exhibits no  distension and no mass. There is no tenderness. There is no rebound and no guarding.  Musculoskeletal: She exhibits no edema or tenderness.  Lymphadenopathy:    She has no cervical adenopathy.  Neurological: She displays normal reflexes. No cranial nerve deficit. She exhibits normal muscle tone. Coordination normal.  Skin: No rash noted. No erythema.  Psychiatric: She has a normal mood and affect. Her behavior is normal. Judgment and thought content normal.  a wart L shoulder L breast cyst 2.0 x  2.5 cm, tender at 7 o'clock POC US shows a cavity   Procedure Note :     Procedure : Cryosurgery   Indication:  Wart(s)   Risks including unsuccessful procedure , bleeding, infection, bruising, scar, a need for a repeat  procedure and others were explained to the patient in detail as well as the benefits. Informed consent was obtained verbally.    1 lesion(s)  on L shoulder   was/were treated with liquid nitrogen on a Q-tip in a usual fasion . Band-Aid was applied and antibiotic ointment was given for a later use.   Tolerated well. Complications none.      Lab Results  Component Value Date   WBC 3.8 (L) 07/28/2017   HGB 13.5 07/28/2017   HCT 39.9 07/28/2017   PLT 252 07/28/2017   GLUCOSE 139 07/28/2017   CHOL 205 (H) 06/05/2015   TRIG 257.0 (H) 06/05/2015   HDL 44.60 06/05/2015   LDLDIRECT 125.0 06/05/2015   LDLCALC 122 (H) 12/04/2007   ALT 13 07/28/2017   AST 21 07/28/2017   NA 139 07/28/2017   K 3.9 07/28/2017   CL 104 07/28/2017   CREATININE 1.26 (H) 07/28/2017   BUN 15 07/28/2017   CO2 25 07/28/2017   TSH 2.16 06/18/2017   INR 0.98 12/30/2016   HGBA1C 5.4 12/05/2010    Ct Chest W Contrast  Result Date: 07/14/2017 CLINICAL DATA:  Persistent productive cough. EXAM: CT CHEST WITH CONTRAST TECHNIQUE: Multidetector CT imaging of the chest was performed during intravenous contrast administration. CONTRAST:  45mL ISOVUE-300 IOPAMIDOL (ISOVUE-300) INJECTION 61% COMPARISON:  Chest radiograph 07/09/2017 and CT chest 05/17/2008. FINDINGS: Cardiovascular: Atherosclerotic calcification of the arterial vasculature, including coronary arteries. There may be a broad-based ulcerative plaque along the transverse aorta. Heart size normal. No pericardial effusion. Mediastinum/Nodes: Thyroidectomy. No pathologically enlarged mediastinal, hilar or axillary lymph nodes. Surgical clips in the right axilla. Esophagus is grossly unremarkable. Lungs/Pleura: Biapical pleuroparenchymal scarring.  Mild subpleural reticulation and ground-glass, without a definite zonal predominance. Pleuroparenchymal scarring in the lateral right hemithorax. Subpleural radiation fibrosis in the anterior hemi thoraces bilaterally. No pleural fluid. Airway is unremarkable. Upper Abdomen: Visualized portions of the liver, gallbladder and right adrenal gland are unremarkable. Nodular thickening of the left adrenal gland. 2.6 cm low-attenuation lesion in the upper pole right kidney, likely a cyst. Partially imaged low-attenuation lesion in the left renal hilum. Visualized portions of the spleen, pancreas, stomach and bowel are grossly unremarkable. No upper abdominal adenopathy. Musculoskeletal: Degenerative changes in the spine. No worrisome lytic or sclerotic lesions. IMPRESSION: 1. Mild subpleural fibrosis may be due to nonspecific interstitial pneumonitis or usual interstitial pneumonitis. 2. Aortic atherosclerosis (ICD10-170.0). Coronary artery calcification. Electronically Signed   By: Lorin Picket M.D.   On: 07/14/2017 09:32    Assessment & Plan:   There are no diagnoses linked to this encounter.   No orders of the defined types were placed in this encounter.    Follow-up: No follow-ups on file.  Alex Keenya Matera,  MD

## 2017-09-17 NOTE — Assessment & Plan Note (Signed)
On B12 

## 2017-09-17 NOTE — Assessment & Plan Note (Signed)
Diagn US - aspiration

## 2017-09-17 NOTE — Assessment & Plan Note (Signed)
losartan 

## 2017-09-17 NOTE — Assessment & Plan Note (Signed)
L shoulder Cryo

## 2017-09-18 NOTE — Addendum Note (Signed)
Addended by: Karren Cobble on: 09/18/2017 04:31 PM   Modules accepted: Orders

## 2017-09-22 ENCOUNTER — Other Ambulatory Visit: Payer: Self-pay | Admitting: Family

## 2017-09-22 DIAGNOSIS — N6002 Solitary cyst of left breast: Secondary | ICD-10-CM

## 2017-09-22 DIAGNOSIS — Z853 Personal history of malignant neoplasm of breast: Secondary | ICD-10-CM

## 2017-09-26 ENCOUNTER — Other Ambulatory Visit: Payer: Medicare Other

## 2017-09-29 ENCOUNTER — Ambulatory Visit
Admission: RE | Admit: 2017-09-29 | Discharge: 2017-09-29 | Disposition: A | Discharge status: Home / Self Care | Payer: Medicare Other | Source: Ambulatory Visit | Attending: Family | Admitting: Family

## 2017-09-29 DIAGNOSIS — R928 Other abnormal and inconclusive findings on diagnostic imaging of breast: Secondary | ICD-10-CM | POA: Diagnosis not present

## 2017-09-29 DIAGNOSIS — N6002 Solitary cyst of left breast: Secondary | ICD-10-CM

## 2017-09-29 DIAGNOSIS — N6324 Unspecified lump in the left breast, lower inner quadrant: Secondary | ICD-10-CM | POA: Diagnosis not present

## 2017-09-29 DIAGNOSIS — Z853 Personal history of malignant neoplasm of breast: Secondary | ICD-10-CM | POA: Diagnosis not present

## 2017-10-03 ENCOUNTER — Other Ambulatory Visit: Payer: Self-pay | Admitting: Internal Medicine

## 2017-10-09 ENCOUNTER — Ambulatory Visit: Payer: Self-pay | Admitting: *Deleted

## 2017-10-09 ENCOUNTER — Other Ambulatory Visit (INDEPENDENT_AMBULATORY_CARE_PROVIDER_SITE_OTHER): Payer: Medicare Other

## 2017-10-09 ENCOUNTER — Encounter: Payer: Self-pay | Admitting: Family

## 2017-10-09 ENCOUNTER — Other Ambulatory Visit: Payer: Self-pay | Admitting: Family

## 2017-10-09 ENCOUNTER — Ambulatory Visit (INDEPENDENT_AMBULATORY_CARE_PROVIDER_SITE_OTHER): Payer: Medicare Other | Admitting: Family

## 2017-10-09 ENCOUNTER — Ambulatory Visit (INDEPENDENT_AMBULATORY_CARE_PROVIDER_SITE_OTHER)
Admission: RE | Admit: 2017-10-09 | Discharge: 2017-10-09 | Disposition: A | Discharge status: Home / Self Care | Payer: Medicare Other | Source: Ambulatory Visit | Attending: Family | Admitting: Family

## 2017-10-09 VITALS — BP 104/68 | HR 103 | Temp 98.0°F | Ht 64.0 in | Wt 147.0 lb

## 2017-10-09 DIAGNOSIS — K5900 Constipation, unspecified: Secondary | ICD-10-CM

## 2017-10-09 DIAGNOSIS — R195 Other fecal abnormalities: Secondary | ICD-10-CM | POA: Diagnosis not present

## 2017-10-09 DIAGNOSIS — R109 Unspecified abdominal pain: Secondary | ICD-10-CM

## 2017-10-09 LAB — CBC WITH DIFFERENTIAL/PLATELET
BASOS PCT: 0.3 % (ref 0.0–3.0)
Basophils Absolute: 0 10*3/uL (ref 0.0–0.1)
EOS PCT: 2 % (ref 0.0–5.0)
Eosinophils Absolute: 0.1 10*3/uL (ref 0.0–0.7)
HCT: 41.2 % (ref 36.0–46.0)
Hemoglobin: 13.9 g/dL (ref 12.0–15.0)
LYMPHS ABS: 1.9 10*3/uL (ref 0.7–4.0)
Lymphocytes Relative: 40.2 % (ref 12.0–46.0)
MCHC: 33.8 g/dL (ref 30.0–36.0)
MCV: 97.9 fl (ref 78.0–100.0)
MONOS PCT: 10.5 % (ref 3.0–12.0)
Monocytes Absolute: 0.5 10*3/uL (ref 0.1–1.0)
NEUTROS ABS: 2.3 10*3/uL (ref 1.4–7.7)
NEUTROS PCT: 47 % (ref 43.0–77.0)
PLATELETS: 277 10*3/uL (ref 150.0–400.0)
RBC: 4.21 Mil/uL (ref 3.87–5.11)
RDW: 13.3 % (ref 11.5–15.5)
WBC: 4.8 10*3/uL (ref 4.0–10.5)

## 2017-10-09 LAB — COMPREHENSIVE METABOLIC PANEL
ALT: 13 U/L (ref 0–35)
AST: 19 U/L (ref 0–37)
Albumin: 4.5 g/dL (ref 3.5–5.2)
Alkaline Phosphatase: 65 U/L (ref 39–117)
BUN: 17 mg/dL (ref 6–23)
CO2: 29 meq/L (ref 19–32)
Calcium: 9.8 mg/dL (ref 8.4–10.5)
Chloride: 106 mEq/L (ref 96–112)
Creatinine, Ser: 1.39 mg/dL — ABNORMAL HIGH (ref 0.40–1.20)
GFR: 38.92 mL/min — AB (ref >60.00)
GLUCOSE: 119 mg/dL — AB (ref 70–99)
POTASSIUM: 4.6 meq/L (ref 3.5–5.1)
Sodium: 141 mEq/L (ref 135–145)
Total Bilirubin: 0.8 mg/dL (ref 0.2–1.2)
Total Protein: 7.9 g/dL (ref 6.0–8.3)

## 2017-10-09 NOTE — Progress Notes (Signed)
[name] [name] is a 77 y.o. female with the following history as recorded in Epic[employer]:  Patient Active Problem List   Diagnosis Date Noted  . Breast cyst, left 09/17/2017  . Aortic atherosclerosis ([place]) 07/28/2017  . Osteoporosis 05/07/2017  . Osteopenia 02/19/2017  . Genetic testing 12/05/2016  . Family history of breast cancer   . Port catheter in place 10/23/2016  . Encounter for antineoplastic chemotherapy 10/23/2016  . Malignant neoplasm of upper-outer quadrant of left breast in female, estrogen receptor positive ([place]) 07/29/2016  . Well adult exam 06/05/2015  . Neoplasm of uncertain behavior of skin 06/05/2015  . Adenopathy, cervical 06/05/2015  . Tachycardia 06/07/2014  . Fatigue 06/07/2014  . Wart viral 10/18/2011  . Hyperglycemia 11/06/2010  . Actinic keratoses 05/25/2010  . Essential hypertension 02/23/2010  . BREAST MASS 12/12/2009  . SHOULDER PAIN 11/22/2009  . Chronic fatigue 11/08/2009  . RLQ PAIN 11/08/2009  . VERTIGO 07/11/2009  . ECZEMA 10/19/2008  . CT, CHEST, ABNORMAL 05/30/2008  . STYE 02/17/2008  . B12 deficiency 12/16/2007  . Vitamin D deficiency 12/16/2007  . HYPOTHYROIDISM, POSTSURGICAL 12/01/2007  . Acute maxillary sinusitis 11/11/2007  . ALLERGIC RESPIRATORY DISEASE, EXTRINSIC 11/11/2007  . THYROID CANCER 08/19/2007  . Dyslipidemia 08/19/2007  . Anxiety state 08/19/2007  . DEPRESSION 08/19/2007  . ALLERGIC RHINITIS 08/19/2007  . BREAST CANCER, HX OF 08/19/2007    Current Outpatient Medications  Medication Sig Dispense Refill  . ALPRAZolam (XANAX) 1 MG tablet Take 0.5-1 tablets (0.5-1 mg total) by mouth 2 (two) times daily as needed for anxiety. 60 tablet 3  . anastrozole (ARIMIDEX) 1 MG tablet Take 1 tablet (1 mg total) by mouth daily. 90 tablet 4  . cholecalciferol (VITAMIN D) 1000 units tablet Take 2 tablets (2,000 Units total) by mouth daily. 100 tablet 3  . Cholecalciferol (VITAMIN D3) 50000 units CAPS TAKE ONE CAPSULE BY MOUTH ONE TIME  PER WEEK 6 capsule 1  . DULoxetine (CYMBALTA) 60 MG capsule TAKE 1 CAPSULE BY MOUTH EVERY MORNING 90 capsule 1  . levothyroxine (SYNTHROID, LEVOTHROID) 112 MCG tablet TAKE 1 TABLET (112 MCG TOTAL) BY MOUTH DAILY. 90 tablet 3  . rosuvastatin (CRESTOR) 20 MG tablet As directed 30 tablet 5  . telmisartan (MICARDIS) 40 MG tablet Take 1 tablet (40 mg total) by mouth daily. 90 tablet 3  . venlafaxine XR (EFFEXOR-XR) 75 MG 24 hr capsule Take 1 capsule (75 mg total) daily with breakfast by mouth. 30 capsule 6  . vitamin B-12 (CYANOCOBALAMIN) 1000 MCG tablet Take 1,000 mcg by mouth every other day.     No current facility-administered medications for this visit.     Allergies: Pneumococcal vaccines; Sulfa antibiotics; Sulfacetamide sodium-sulfur; Tape; and Tegaderm ag mesh [silver]  Past Medical History:  Diagnosis Date  . Allergic rhinitis   . Anxiety   . Breast cancer ([place]) hx 2004   recurrent 2006 DR Magrinat  . Family history of breast cancer   . Genetic testing 12/05/2016   Multi-Cancer panel (83 genes) @ [facility] - Monoallelic mutation in [place] (carrier)  . HTN (hypertension)   . Hyperlipidemia   . Hypothyroidism   . Personal history of chemotherapy 2002  . Personal history of radiation therapy 2002  . Psoriasis   . S/P thyroidectomy 07/2005   2 cm largest diameter (1 other tiny focus)/ i-131 rx 99 [facility] 08/2005  . Thyroid cancer ([place])    Papillary Stage 1 - Dr Loanne Drilling  . Vitamin B12 deficiency 2009  . Vitamin D deficiency  2009    Past Surgical History:  Procedure Laterality Date  . BREAST LUMPECTOMY    . BREAST LUMPECTOMY WITH RADIOACTIVE SEED AND SENTINEL LYMPH NODE BIOPSY Left 09/11/2016   Procedure: LEFT BREAST LUMPECTOMY WITH RADIOACTIVE SEED AND LEFT AXILLARY SENTINEL LYMPH NODE BIOPSY WITH BLUE DYE INJECTION;  Surgeon: [name], MD;  Location: [facility];  Service: General;  Laterality: Left;  . IR FLUORO GUIDE PORT INSERTION RIGHT  09/13/2016  . IR REMOVAL TUN  ACCESS W/ PORT W/O FL MOD SED  12/30/2016  . IR US GUIDE VASC ACCESS RIGHT  09/13/2016  . MASTECTOMY     Right  . PORTACATH PLACEMENT N/A 09/11/2016   Procedure: ATTEMPTED INSERTION PORT-A-CATH WITH ULTRA SOUND GUIDANCE;  Surgeon: [name], MD;  Location: [facility];  Service: General;  Laterality: N/A;  . REDUCTION MAMMAPLASTY Left 2005  . THYROIDECTOMY  2007    Family History  Problem Relation Age of Onset  . Stroke Mother   . Allergies Mother   . Asthma Mother   . Clotting disorder Mother   . Heart disease Father 110       MI  . Allergies Sister   . Asthma Sister   . Breast cancer Sister 80       Lymphoma 45; currently 11  . Asthma Brother   . Leukemia Brother        dx 14s  . Allergies Daughter   . Asthma Daughter   . Allergies Sister   . Breast cancer Sister 39       currently 34  . Cancer Paternal Aunt        kidney ca; deceased 6  . Cancer Paternal Uncle        unk. primary ("liver")  . Throat cancer Maternal Grandmother        deceased 43  . Lung cancer Maternal Grandfather        deceased 33  . Pancreatic cancer Paternal Grandfather        deceased 52  . Cancer Paternal Aunt        "abdominal"; deceased 49    Social History   Tobacco Use  . Smoking status: Never Smoker  . Smokeless tobacco: Never Used  Substance Use Topics  . Alcohol use: No    Subjective:  Patient presents with 2 day history of constipation; has taken Miralax for the past 2 days and used OTC suppository; notes that her abdomen "just feels sore." "feels like there is a hard lump in the abdomen." Denies any nausea or vomiting; Denies any rectal bleeding; no fever; Was able to have a bowel movement this morning but notes that seemed "abnormal "- describes as very "hard." Not prone to recurrent constipation;    Objective:  Vitals:   10/09/17 1430  BP: 104/68  Pulse: (!) 103  Temp: 98 F (36.7 C)  TempSrc: Oral  SpO2: 97%  Weight: 147 lb (66.7 kg)  Height: 5' 4"   (1.626 m)    General: Well developed, well nourished, in no acute distress  Skin : Warm and dry.  Head: Normocephalic and atraumatic  Lungs: Respirations unlabored; clear to auscultation bilaterally without wheeze, rales, rhonchi  CVS exam: normal rate and regular rhythm.  Abdomen: Soft; nontender; nondistended; normoactive bowel sounds; no masses or hepatosplenomegaly  Neurologic: Alert and oriented; speech intact; face symmetrical; moves all extremities well; CNII-XII intact without focal deficit   Assessment:  1. Constipation, unspecified constipation type     Plan:  Will update labs/ imaging today; will start with CBC, CMP, abdominal x-ray today; will most likely need to get abd/ pelvic CT- need to rule out bowel obstruction. Follow-up to be determined.   No follow-ups on file.  Orders Placed This Encounter  Procedures  . DG Abd 2 Views    Standing Status:   Future    Number of Occurrences:   1    Standing Expiration Date:   12/10/2018    Order Specific Question:   Reason for Exam (SYMPTOM  OR DIAGNOSIS REQUIRED)    Answer:   sudden onset of constipation    Order Specific Question:   Preferred imaging location?    Answer:   [facility]    Order Specific Question:   Radiology Contrast Protocol - do NOT remove file path    Answer:   [email or web address]  . CBC w/Diff    Standing Status:   Future    Number of Occurrences:   1    Standing Expiration Date:   10/09/2018  . Comp Met (CMET)    Standing Status:   Future    Number of Occurrences:   1    Standing Expiration Date:   10/09/2018    Requested Prescriptions    No prescriptions requested or ordered in this encounter

## 2017-10-09 NOTE — Telephone Encounter (Signed)
Pt called with complaints of constipation that started 10/07/17; she says that she did not feel well and her "stomach was big"; she says that "her intestines are sore and hard" especially on her left side, and where her navel is; the pt reports that she used suppositories twice yesterday; she also complains of nausea; her largest complaints are "the pressure and the hardness of my intestines"; recommendations made per nurse triage protocol to include seeing a physician within 24 hours; the pt normally sees Dr Alain Marion but he has no availability within parameters set; pt offered and accepted appointment with Jodi Mourning, [facility], today at 1420; she verbalizes understanding; will route to office for notification of this upcoming appointment.    Reason for Disposition . Abdomen is more swollen than usual  Answer Assessment - Initial Assessment Questions 1. STOOL PATTERN OR FREQUENCY: "How often do you pass bowel movements (BMs)?"  (Normal range: tid to q 3 days)  "When was the last BM passed?"       Normally every morning; last BM 10/08/17 at 2 suppositories 2. STRAINING: "Do you have to strain to have a BM?"      Yes, "a little bit" 3. RECTAL PAIN: "Does your rectum hurt when the stool comes out?" If so, ask: "Do you have hemorrhoids? How bad is the pain?"  (Scale 1-10; or mild, moderate, severe)     no 4. STOOL COMPOSITION: "Are the stools hard?"      yes 5. BLOOD ON STOOLS: "Has there been any blood on the toilet tissue or on the surface of the BM?" If so, ask: "When was the last time?"      no 6. CHRONIC CONSTIPATION: "Is this a new problem for you?"  If no, ask: "How long have you had this problem?" (days, weeks, months)      yes 7. CHANGES IN DIET: "Have there been any recent changes in your diet?"      no 8. MEDICATIONS: "Have you been taking any new medications?"     No, but taking "cancer pill" for 3 months 9. LAXATIVES: "Have you been using any laxatives or enemas?"  If yes, ask "What,  how often, and when was the last time?"    Yes took miralax on 10/07/17  And 10/08/17 10. CAUSE: "What do you think is causing the constipation?"        Not sure 11. OTHER SYMPTOMS: "Do you have any other symptoms?" (e.g., abdominal pain, fever, vomiting)       Abdominal, nausea 12. PREGNANCY: "Is there any chance you are pregnant?" "When was your last menstrual period?"       no  Protocols used: CONSTIPATION-A-AH

## 2017-10-10 ENCOUNTER — Inpatient Hospital Stay: Admission: RE | Admit: 2017-10-10 | Payer: Medicare Other | Source: Ambulatory Visit

## 2017-10-10 ENCOUNTER — Other Ambulatory Visit: Payer: Self-pay | Admitting: Family

## 2017-10-10 ENCOUNTER — Ambulatory Visit (INDEPENDENT_AMBULATORY_CARE_PROVIDER_SITE_OTHER)
Admission: RE | Admit: 2017-10-10 | Discharge: 2017-10-10 | Disposition: A | Discharge status: Home / Self Care | Payer: Medicare Other | Source: Ambulatory Visit | Attending: Family | Admitting: Family

## 2017-10-10 DIAGNOSIS — K5900 Constipation, unspecified: Secondary | ICD-10-CM

## 2017-10-10 DIAGNOSIS — R109 Unspecified abdominal pain: Secondary | ICD-10-CM

## 2017-10-10 DIAGNOSIS — K5730 Diverticulosis of large intestine without perforation or abscess without bleeding: Secondary | ICD-10-CM | POA: Diagnosis not present

## 2017-10-10 MED ORDER — IOPAMIDOL (ISOVUE-300) INJECTION 61%
100.0000 mL | Freq: Once | INTRAVENOUS | Status: AC | PRN
  Administered 2017-10-10: 100 mL via INTRAVENOUS

## 2017-10-21 ENCOUNTER — Other Ambulatory Visit: Payer: Self-pay | Admitting: Internal Medicine

## 2017-12-15 ENCOUNTER — Ambulatory Visit: Payer: Medicare Other | Admitting: Internal Medicine

## 2017-12-24 NOTE — Telephone Encounter (Signed)
error 

## 2017-12-29 ENCOUNTER — Other Ambulatory Visit: Payer: Self-pay | Admitting: Internal Medicine

## 2018-01-20 ENCOUNTER — Other Ambulatory Visit (INDEPENDENT_AMBULATORY_CARE_PROVIDER_SITE_OTHER): Payer: Medicare Other

## 2018-01-20 ENCOUNTER — Ambulatory Visit (INDEPENDENT_AMBULATORY_CARE_PROVIDER_SITE_OTHER): Payer: Medicare Other | Admitting: Internal Medicine

## 2018-01-20 ENCOUNTER — Encounter: Payer: Self-pay | Admitting: Internal Medicine

## 2018-01-20 ENCOUNTER — Encounter

## 2018-01-20 VITALS — BP 112/70 | HR 97 | Temp 98.0°F | Ht 64.0 in | Wt 149.0 lb

## 2018-01-20 DIAGNOSIS — I2510 Atherosclerotic heart disease of native coronary artery without angina pectoris: Secondary | ICD-10-CM | POA: Diagnosis not present

## 2018-01-20 DIAGNOSIS — F411 Generalized anxiety disorder: Secondary | ICD-10-CM | POA: Diagnosis not present

## 2018-01-20 DIAGNOSIS — E538 Deficiency of other specified B group vitamins: Secondary | ICD-10-CM

## 2018-01-20 DIAGNOSIS — I700 Atherosclerosis of aorta: Secondary | ICD-10-CM

## 2018-01-20 DIAGNOSIS — E559 Vitamin D deficiency, unspecified: Secondary | ICD-10-CM

## 2018-01-20 DIAGNOSIS — I10 Essential (primary) hypertension: Secondary | ICD-10-CM

## 2018-01-20 DIAGNOSIS — F4321 Adjustment disorder with depressed mood: Secondary | ICD-10-CM

## 2018-01-20 LAB — HEPATIC FUNCTION PANEL
ALT: 9 U/L (ref 0–35)
AST: 19 U/L (ref 0–37)
Albumin: 4.4 g/dL (ref 3.5–5.2)
Alkaline Phosphatase: 51 U/L (ref 39–117)
Bilirubin, Direct: 0.2 mg/dL (ref 0.0–0.3)
TOTAL PROTEIN: 7.6 g/dL (ref 6.0–8.3)
Total Bilirubin: 1.1 mg/dL (ref 0.2–1.2)

## 2018-01-20 LAB — BASIC METABOLIC PANEL
BUN: 22 mg/dL (ref 6–23)
CHLORIDE: 105 meq/L (ref 96–112)
CO2: 26 meq/L (ref 19–32)
CREATININE: 1.24 mg/dL — AB (ref 0.40–1.20)
Calcium: 9.7 mg/dL (ref 8.4–10.5)
GFR: 44.37 mL/min — ABNORMAL LOW (ref >60.00)
GLUCOSE: 94 mg/dL (ref 70–99)
Potassium: 3.6 mEq/L (ref 3.5–5.1)
SODIUM: 140 meq/L (ref 135–145)

## 2018-01-20 MED ORDER — CYANOCOBALAMIN 1000 MCG/ML IJ SOLN
1000.0000 ug | Freq: Once | INTRAMUSCULAR | Status: AC
  Administered 2018-01-20: 1000 ug via INTRAMUSCULAR

## 2018-01-20 MED ORDER — ASPIRIN EC 81 MG PO TBEC: 81.0000 mg | DELAYED_RELEASE_TABLET | Freq: Every day | ORAL | 3 refills | Status: AC

## 2018-01-20 NOTE — Progress Notes (Signed)
Subjective:  Patient ID: [name] [name], female    DOB: 10-26-1940  Age: 77 y.o. MRN: [id number]  CC: No chief complaint on file.   HPI [name] [name] presents for stress w/mother in law 89 F/u anxiety, HTN, dyslipidemia f/u  Outpatient Medications Prior to Visit  Medication Sig Dispense Refill  . ALPRAZolam (XANAX) 1 MG tablet TAKE 0.5-1 TABLETS (0.5-1 MG TOTAL) BY MOUTH 2 (TWO) TIMES DAILY AS NEEDED FOR ANXIETY. 60 tablet 2  . anastrozole (ARIMIDEX) 1 MG tablet Take 1 tablet (1 mg total) by mouth daily. 90 tablet 4  . cholecalciferol (VITAMIN D) 1000 units tablet Take 2 tablets (2,000 Units total) by mouth daily. 100 tablet 3  . Cholecalciferol (VITAMIN D3) 50000 units CAPS TAKE ONE CAPSULE BY MOUTH ONE TIME PER WEEK 6 capsule 1  . DULoxetine (CYMBALTA) 60 MG capsule TAKE 1 CAPSULE BY MOUTH EVERY MORNING 90 capsule 1  . levothyroxine (SYNTHROID, LEVOTHROID) 112 MCG tablet TAKE 1 TABLET (112 MCG TOTAL) BY MOUTH DAILY. 90 tablet 3  . rosuvastatin (CRESTOR) 20 MG tablet As directed 30 tablet 5  . telmisartan (MICARDIS) 40 MG tablet Take 1 tablet (40 mg total) by mouth daily. 90 tablet 3  . venlafaxine XR (EFFEXOR-XR) 75 MG 24 hr capsule Take 1 capsule (75 mg total) daily with breakfast by mouth. 30 capsule 6  . vitamin B-12 (CYANOCOBALAMIN) 1000 MCG tablet Take 1,000 mcg by mouth every other day.     No facility-administered medications prior to visit.     ROS: Review of Systems  Constitutional: Negative for activity change, appetite change, chills, fatigue and unexpected weight change.  HENT: Negative for congestion, mouth sores and sinus pressure.   Eyes: Negative for visual disturbance.  Respiratory: Negative for cough and chest tightness.   Gastrointestinal: Negative for abdominal pain and nausea.  Genitourinary: Negative for difficulty urinating, frequency and vaginal pain.  Musculoskeletal: Negative for back pain and gait problem.  Skin: Negative for pallor and rash.    Neurological: Negative for dizziness, tremors, weakness, numbness and headaches.  Psychiatric/Behavioral: Negative for confusion and sleep disturbance. The patient is nervous/anxious.     Objective:  BP 112/70 (BP Location: Right Arm, Patient Position: Sitting, Cuff Size: Normal)   Pulse 97   Temp 98 F (36.7 C) (Oral)   Ht 5\' 4"  (1.626 m)   Wt 149 lb (67.6 kg)   SpO2 97%   BMI 25.58 kg/m   BP Readings from Last 3 Encounters:  01/20/18 112/70  10/09/17 104/68  09/17/17 128/76    Wt Readings from Last 3 Encounters:  01/20/18 149 lb (67.6 kg)  10/09/17 147 lb (66.7 kg)  09/17/17 146 lb (66.2 kg)    Physical Exam  Constitutional: She appears well-developed. No distress.  HENT:  Head: Normocephalic.  Right Ear: External ear normal.  Left Ear: External ear normal.  Nose: Nose normal.  Mouth/Throat: Oropharynx is clear and moist.  Eyes: Pupils are equal, round, and reactive to light. Conjunctivae are normal. Right eye exhibits no discharge. Left eye exhibits no discharge.  Neck: Normal range of motion. Neck supple. No JVD present. No tracheal deviation present. No thyromegaly present.  Cardiovascular: Normal rate, regular rhythm and normal heart sounds.  Pulmonary/Chest: No stridor. No respiratory distress. She has no wheezes.  Abdominal: Soft. Bowel sounds are normal. She exhibits no distension and no mass. There is no tenderness. There is no rebound and no guarding.  Musculoskeletal: She exhibits no edema or tenderness.  Lymphadenopathy:  She has no cervical adenopathy.  Neurological: She displays normal reflexes. No cranial nerve deficit. She exhibits normal muscle tone. Coordination normal.  Skin: No rash noted. No erythema.  Psychiatric: She has a normal mood and affect. Her behavior is normal. Judgment and thought content normal.    Lab Results  Component Value Date   WBC 4.8 10/09/2017   HGB 13.9 10/09/2017   HCT 41.2 10/09/2017   PLT 277.0 10/09/2017    GLUCOSE 119 (H) 10/09/2017   CHOL 205 (H) 06/05/2015   TRIG 257.0 (H) 06/05/2015   HDL 44.60 06/05/2015   LDLDIRECT 125.0 06/05/2015   LDLCALC 122 (H) 12/04/2007   ALT 13 10/09/2017   AST 19 10/09/2017   NA 141 10/09/2017   K 4.6 10/09/2017   CL 106 10/09/2017   CREATININE 1.39 (H) 10/09/2017   BUN 17 10/09/2017   CO2 29 10/09/2017   TSH 2.16 06/18/2017   INR 0.98 12/30/2016   HGBA1C 5.4 12/05/2010    Ct Abdomen Pelvis W Contrast  Result Date: 10/10/2017 CLINICAL DATA:  Acute generalized abdominal pain. EXAM: CT ABDOMEN AND PELVIS WITH CONTRAST TECHNIQUE: Multidetector CT imaging of the abdomen and pelvis was performed using the standard protocol following bolus administration of intravenous contrast. CONTRAST:  146mL ISOVUE-300 IOPAMIDOL (ISOVUE-300) INJECTION 61% COMPARISON:  CT scan of November 10, 2009. FINDINGS: Lower chest: No acute abnormality. Hepatobiliary: No focal liver abnormality is seen. No gallstones, gallbladder wall thickening, or biliary dilatation. Pancreas: Unremarkable. No pancreatic ductal dilatation or surrounding inflammatory changes. Spleen: Normal in size without focal abnormality. Adrenals/Urinary Tract: Stable small left adrenal adenoma. Right adrenal gland appears normal. Bilateral renal cysts are noted. No hydronephrosis or renal obstruction is noted. No renal or ureteral calculi are noted. Urinary bladder is unremarkable. Stomach/Bowel: Stomach is within normal limits. Appendix appears normal. No evidence of bowel wall thickening, distention, or inflammatory changes. Sigmoid diverticulosis is noted without inflammation. Vascular/Lymphatic: Aortic atherosclerosis. No enlarged abdominal or pelvic lymph nodes. Reproductive: Uterus is unremarkable. No adnexal abnormality is noted. Enlarged left pelvic varices are noted with dilated left ovarian vein suggesting pelvic congestion syndrome. Other: No abdominal wall hernia or abnormality. No abdominopelvic ascites.  Musculoskeletal: No acute or significant osseous findings. IMPRESSION: Sigmoid diverticulosis without inflammation. Enlarged left pelvic varices are noted with dilated left ovarian vein suggesting pelvic congestion syndrome. No acute abnormality is noted in the abdomen or pelvis. Aortic Atherosclerosis (ICD10-I70.0). Electronically Signed   By: Marijo Conception, M.D.   On: 10/10/2017 09:39    Assessment & Plan:   There are no diagnoses linked to this encounter.   No orders of the defined types were placed in this encounter.    Follow-up: No follow-ups on file.  Walker Kehr, MD

## 2018-01-20 NOTE — Assessment & Plan Note (Signed)
Cymbalta 

## 2018-01-20 NOTE — Assessment & Plan Note (Signed)
losartan 

## 2018-01-20 NOTE — Assessment & Plan Note (Signed)
Xanax Chronic  Potential benefits of a long term benzodiazepines  use as well as potential risks  and complications were explained to the patient and were aknowledged.

## 2018-01-20 NOTE — Assessment & Plan Note (Signed)
Crestor, BP control and ASA ?

## 2018-01-20 NOTE — Assessment & Plan Note (Signed)
Vit D 

## 2018-01-20 NOTE — Assessment & Plan Note (Addendum)
On B12 inj 

## 2018-01-20 NOTE — Assessment & Plan Note (Addendum)
chest CT reviewed w/pt -- Crestor, BP control and ASA

## 2018-01-25 NOTE — Progress Notes (Signed)
[facility] called and rescheduled.

## 2018-01-26 ENCOUNTER — Telehealth: Payer: Self-pay | Admitting: Oncology

## 2018-01-26 ENCOUNTER — Inpatient Hospital Stay: Payer: Medicare Other

## 2018-01-26 ENCOUNTER — Inpatient Hospital Stay: Payer: Medicare Other | Admitting: Oncology

## 2018-01-26 ENCOUNTER — Telehealth: Payer: Self-pay | Admitting: *Deleted

## 2018-01-26 NOTE — Telephone Encounter (Signed)
Please call patient.  Her appt is later today

## 2018-01-26 NOTE — Telephone Encounter (Signed)
Per [facility], [name] "wants to make sure her appointment is today.  If it is she needs to cancel because she is sick."   Called Anne-Marie T Flax for further assessment.  Reports diarrhea.  Denies need for [facility].  Injection due today.  Transferred to provider's nurse.

## 2018-01-26 NOTE — Telephone Encounter (Signed)
Called patient per 12/16 sch message to r/s appt - no answer - left message for patient to call back to r./s appt to best time for patient

## 2018-01-26 NOTE — Telephone Encounter (Signed)
Patient called [facility] to reschedule today's appointments.  Scheduling message sent at this time.

## 2018-01-27 ENCOUNTER — Telehealth: Payer: Self-pay | Admitting: Oncology

## 2018-01-27 NOTE — Telephone Encounter (Signed)
R/s appt per 12/16 sch message- pt is aware of appt date and time   

## 2018-01-30 NOTE — Telephone Encounter (Signed)
.  [name] [name] has been rescheduled to 02-03-2018.

## 2018-02-02 NOTE — Progress Notes (Signed)
. [name]   DOB: 1940-10-19  MR#: [id number]  CSN#:[id number]  PCP: [name] Anger, MD Patient Care Team: [name] Anger, MD as PCP - General [name] [name], [name] Dad, MD as Consulting Physician (Oncology) [name] [name], MD as Consulting Physician (General Surgery) [name] Campbell, MD as Consulting Physician (Gastroenterology) [name] Rudd, MD as Consulting Physician (Radiation Oncology)   CHIEF COMPLAINT: Weakly estrogen receptor positive breast cancer  CURRENT TREATMENT: Anastrozole   INTERVAL HISTORY: [name] [name] returns today for follow-up of her weakly estrogen receptor positive breast cancer.  The patient continues on anastrozole, which she is tolerating well. She is not having hot flashes, but she feels fatigued.  She tells me Dr. Alain [name] gave her a B12 shot and it wired her up for 3 days.  She wonders if she can repeat that daily  [name] [name]'s last bone density screening on 05/06/2017, showed a T-score of -3.4, which is considered osteoporotic.   She received her first Prolia/denosumab dose in June and tolerated that well.  She will receive a second dose today.  Since her last visit here on 07/27/2017, she underwent a digital diagnostic unilateral left mammogram with tomography and a left breast ultrasound on 09/29/2017 showing Breast Density Category B. There is a persistent rounded mass at the lumpectomy site in the lower-inner left breast. No suspicious calcifications, masses or areas of distortion are seen in the left breast. Physical exam of the palpable site demonstrates a firm smooth mass deep to the surgical scar. Ultrasound targeted to the palpable site at 8 o'clock, 5 cm from the nipple demonstrates an anechoic oval circumscribed mass measuring 2.5 x 1.3 x 2.2 cm, compatible with a seroma.  She also underwent an abdomen and pelvis CT with contrast on 10/10/2017 showing Sigmoid diverticulosis without inflammation. Enlarged left pelvic varices are noted with dilated  left ovarian vein suggesting pelvic congestion syndrome. No acute abnormality is noted in the abdomen or pelvis. Aortic Atherosclerosis (ICD10-I70.0).   REVIEW OF SYSTEMS: [name] [name] states that she is better than she was, but isn't quite there. She is still upset that her dog, [name] [name], has passed, but she is looking into getting a new dog. Her primary care doctor gave her a shot of Vitamin B12 and she felt energized for about three days; she is considering taking the shots at home. For exercise, she will be joining the [employer] to take [place] with some friends that have also had breast cancer. The patient denies unusual headaches, visual changes, nausea, vomiting, or dizziness. There has been no unusual cough, phlegm production, or pleurisy. This been no change in bowel or bladder habits. The patient denies unexplained fatigue or unexplained weight loss, bleeding, rash, or fever. A detailed review of systems was otherwise noncontributory.     BREAST CANCER HISTORY: From the recent summary note:  I last saw [name] [name] in 2013 when she was released from follow-up from her right breast cancer recurrence in 2004. She is status post right mastectomy and adjuvant chemotherapy for that triple negative tumor. More recently, on 07/16/2016 she underwent left screening mammography at the [facility] on 07/16/2016. This found the breast density to be category B. There was a possible asymmetry in the left breast, and on 07/19/2016 she underwent left diagnostic mammography with tomography and left breast ultrasonography. This confirmed a well circumscribed oval mass at the 9:00 position of the left breast measuring approximately 0.8 cm. This was not palpable. On ultrasonography the mass measured 0.6 cm and it was located at  the 9:00 radiant 4 cm from the nipple. The left axilla was sonographically benign.  On June 11 [name] [name] underwent biopsy of the left breast mass in question and this showed (SAA [phone]) and invasive  ductal carcinoma, grade 3, estrogen receptor 5% positive with weak staining intensity, progesterone receptor negative, with an MIB-1 of 80%, and HER-2 nonamplified, the signals ratio being 1.46 and the number per cell 1.90.  Her subsequent history is as detailed below.   PAST MEDICAL HISTORY: Past Medical History:  Diagnosis Date  . Allergic rhinitis   . Anxiety   . Breast cancer ([place]) hx 2004   recurrent 2006 DR [name] [name]  . Family history of breast cancer   . 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 HTN (hypertension)   . Hyperlipidemia   . Hypothyroidism   . Personal history of chemotherapy 2002  . Personal history of radiation therapy 2002  . Psoriasis   . S/P thyroidectomy 07/2005   2 cm largest diameter (1 other tiny focus)/ i-131 rx 99 mci 08/2005  . Thyroid cancer ([place])    Papillary Stage 1 - Dr [name] [name]  . Vitamin B12 deficiency 2009  . Vitamin D deficiency 2009    PAST SURGICAL HISTORY: Past Surgical History:  Procedure Laterality Date  . BREAST LUMPECTOMY    . BREAST LUMPECTOMY WITH RADIOACTIVE SEED AND SENTINEL LYMPH NODE BIOPSY Left 09/11/2016   Procedure: LEFT BREAST LUMPECTOMY WITH RADIOACTIVE SEED AND LEFT AXILLARY SENTINEL LYMPH NODE BIOPSY WITH BLUE DYE INJECTION;  Surgeon: [name] [name], MD;  Location: [facility];  Service: General;  Laterality: Left;  . IR FLUORO GUIDE PORT INSERTION RIGHT  09/13/2016  . IR REMOVAL TUN ACCESS W/ PORT W/O FL MOD SED  12/30/2016  . IR US GUIDE VASC ACCESS RIGHT  09/13/2016  . MASTECTOMY     Right  . PORTACATH PLACEMENT N/A 09/11/2016   Procedure: ATTEMPTED INSERTION PORT-A-CATH WITH ULTRA SOUND GUIDANCE;  Surgeon: [name] [name], MD;  Location: [facility];  Service: General;  Laterality: N/A;  . REDUCTION MAMMAPLASTY Left 2005  . THYROIDECTOMY  2007    FAMILY HISTORY Family History  Problem Relation Age of Onset  . Stroke  Mother   . Allergies Mother   . Asthma Mother   . Clotting disorder Mother   . Heart disease Father 74       MI  . Allergies Sister   . Asthma Sister   . Breast cancer Sister 81       Lymphoma 52; currently 61  . Asthma Brother   . Leukemia Brother        dx 12s  . Allergies Daughter   . Asthma Daughter   . Allergies Sister   . Breast cancer Sister 52       currently 73  . Cancer Paternal Aunt        kidney ca; deceased 52  . Cancer Paternal Uncle        unk. primary ("liver")  . Throat cancer Maternal Grandmother        deceased 39  . Lung cancer Maternal Grandfather        deceased 50  . Pancreatic cancer Paternal Grandfather        deceased 87  . Cancer Paternal Aunt        "abdominal"; deceased 15  Family history includes a sister, a paternal aunt, and a paternal cousin with breast cancer  all older then 66 at diagnosis  GYN HISTORY: Menarche age 22, first live birth age 1. She is GX P3. She went through the change of life age 77. She did not take hormone replacement.  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 Her husband [name] is retired from [name]. Her children are [name] [name], who works for time [employer] in [place], and [place], who lives in [place] and works for [employer]. The third child, [name], died in an automobile accident at age 78. The patient has one granddaughter. Her dog [name] [name], just passed away at 78 years old. 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 Smoking status: Never Smoker  . Smokeless tobacco: Never Used  Substance Use Topics  . Alcohol use: No  . Drug use: No     Colonoscopy:  PAP:  Bone density: 05/06/2017 showed a T score of -3.4 osteoporosis  Lipid panel:  Allergies  Allergen Reactions  . Pneumococcal Vaccines Other (See Comments)    Was really sick   . Sulfa Antibiotics Other (See Comments)    Pt believes it  was hives  . Sulfacetamide Sodium-Sulfur   . Tape Itching, Dermatitis and Rash  . Tegaderm Ag Mesh [Silver] Itching and Rash    Current Outpatient Medications  Medication Sig Dispense Refill  . ALPRAZolam (XANAX) 1 MG tablet TAKE 0.5-1 TABLETS (0.5-1 MG TOTAL) BY MOUTH 2 (TWO) TIMES DAILY AS NEEDED FOR ANXIETY. 60 tablet 2  . anastrozole (ARIMIDEX) 1 MG tablet Take 1 tablet (1 mg total) by mouth daily. 90 tablet 4  . aspirin EC 81 MG tablet Take 1 tablet (81 mg total) by mouth daily. 100 tablet 3  . cholecalciferol (VITAMIN D) 1000 units tablet Take 2 tablets (2,000 Units total) by mouth daily. 100 tablet 3  . Cholecalciferol (VITAMIN D3) 50000 units CAPS TAKE ONE CAPSULE BY MOUTH ONE TIME PER WEEK 6 capsule 1  . DULoxetine (CYMBALTA) 60 MG capsule TAKE 1 CAPSULE BY MOUTH EVERY MORNING 90 capsule 1  . levothyroxine (SYNTHROID, LEVOTHROID) 112 MCG tablet TAKE 1 TABLET (112 MCG TOTAL) BY MOUTH DAILY. 90 tablet 3  . rosuvastatin (CRESTOR) 20 MG tablet As directed 30 tablet 5  . telmisartan (MICARDIS) 40 MG tablet Take 1 tablet (40 mg total) by mouth daily. 90 tablet 3  . venlafaxine XR (EFFEXOR-XR) 75 MG 24 hr capsule Take 1 capsule (75 mg total) daily with breakfast by mouth. 30 capsule 6  . vitamin B-12 (CYANOCOBALAMIN) 1000 MCG tablet Take 1,000 mcg by mouth every other day.     No current facility-administered medications for this visit.     OBJECTIVE: Older white woman in no acute distress  Vitals:   02/03/18 1135  BP: 122/80  Pulse: 99  Resp: 18  Temp: 97.9 F (36.6 C)  SpO2: 100%     Body mass index is 25.7 kg/m.    ECOG FS: 1  Sclerae unicteric, EOMs intact Oropharynx clear and moist No cervical or supraclavicular adenopathy Lungs no rales or rhonchi Heart regular rate and rhythm Abd soft, nontender, positive bowel sounds MSK no focal spinal tenderness, no upper extremity lymphedema Neuro: nonfocal, well oriented, appropriate affect Breasts: The right breast is status  post lumpectomy and radiation.  There is no evidence of disease recurrence.  The left breast also has undergone lumpectomy and radiation.  There is no evidence of disease recurrence.  There is a known seroma in the left breast upper inner quadrant.  Both axillae are benign.  LAB RESULTS: Lab Results  Component Value Date   WBC 5.1 02/03/2018   NEUTROABS 2.9 02/03/2018   HGB 13.3 02/03/2018   HCT 40.1 02/03/2018   MCV 97.1 02/03/2018   PLT 242 02/03/2018      Chemistry      Component Value Date/Time   NA 140 02/03/2018 1110   NA 139 12/11/2016 1017   K 4.1 02/03/2018 1110   K 3.7 12/11/2016 1017   CL 109 02/03/2018 1110   CL 104 10/09/2011 1403   CO2 21 (L) 02/03/2018 1110   CO2 22 12/11/2016 1017   BUN 19 02/03/2018 1110   BUN 16.9 12/11/2016 1017   CREATININE 1.25 (H) 02/03/2018 1110   CREATININE 1.2 (H) 12/11/2016 1017      Component Value Date/Time   CALCIUM 9.4 02/03/2018 1110   CALCIUM 9.0 12/11/2016 1017   ALKPHOS 55 02/03/2018 1110   ALKPHOS 139 12/11/2016 1017   AST 22 02/03/2018 1110   AST 65 (H) 12/11/2016 1017   ALT 14 02/03/2018 1110   ALT 51 12/11/2016 1017   BILITOT 0.8 02/03/2018 1110   BILITOT 0.42 12/11/2016 1017       Lab Results  Component Value Date   LABCA2 16 10/09/2011    No components found for: [id number]  No results for input(s): INR in the last 168 hours.  Urinalysis    Component Value Date/Time   COLORURINE YELLOW 06/05/2015 1559   APPEARANCEUR CLEAR 06/05/2015 1559   LABSPEC 1.020 06/05/2015 1559   PHURINE 5.5 06/05/2015 1559   GLUCOSEU NEGATIVE 06/05/2015 1559   HGBUR NEGATIVE 06/05/2015 1559   BILIRUBINUR NEGATIVE 06/05/2015 1559   KETONESUR NEGATIVE 06/05/2015 1559   UROBILINOGEN 0.2 06/05/2015 1559   NITRITE NEGATIVE 06/05/2015 1559   LEUKOCYTESUR MODERATE (A) 06/05/2015 1559    STUDIES: No results found.   ASSESSMENT: 77 y.o. [place] woman   (1) status post right lumpectomy and sentinel lymph node biopsy in  September 2002 for multifocal breast carcinoma, triple negative.  Treated with CMF followed by radiation.   (2) Local recurrence in April 2004, status post right modified radical mastectomy with TRAM reconstruction for what proved to be a T1c N0, stage IA triple negative breast carcinoma.  Treated adjuvantly with paclitaxel and doxorubicin x4 in 2004.  Off treatment since August 2004 with no evidence of recurrence  (3) history of papillary thyroid cancer s/p thyroidectomy June 2007, s/p radioactive iodine July 2007  (4) status post left breast upper outer quadrant biopsy 08/21/2016 for a clinical T1b N0, stage IB invasive ductal carcinoma, grade 3, weakly estrogen receptor positive, progesterone receptor and HER-2 negative, with an MIB-1 of 80%.  (5) left lumpectomy and sentinel lymph node sampling 09/11/2016 found a pT1b pN0, stage IB invasive ductal carcinoma, grade 3, with negative margins.  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*) (a) there were no deleterious mutations in ALK, APC, ATM, AXIN2, BAP1, BARD1, BLM, BMPR1A,  BRCA1, BRCA2, BRIP1, CASR, CDC73, CDH1, CDK4, CDKN1B, CDKN1C, CDKN2A, CEBPA, CHEK2, CTNNA1, DICER1, DIS3L2, EGFR, EPCAM, FH, FLCN, GATA2, GPC3, GREM1, HOXB13, HRAS, KIT, MAX, MEN1, MET, MITF, MLH1, MSH2, MSH3, MSH6, MUTYH, NBN, NF1, NF2, NTHL1, PALB2, PDGFRA, PHOX2B, PMS2, POLD1, POLE, POT1, PRKAR1A, PTCH1, PTEN, RAD50, RAD51C, RAD51D, RB1, RECQL4, RET, RUNX1, SDHA, SDHAF2, SDHB, SDHC, SDHD, SMAD4, SMARCA4,  SMARCB1, SMARCE1, STK11, SUFU, TERC, TERT, TMEM127, TP53, TSC1, TSC2, VHL, WRN, WT1 (b) while homozygous mutations in the NPH L1 gene are associated with autosomal recessive polyposis, there is no evidence to suggest that a single mutation in this gene increases cancer risk.  (9) to start anastrozole 03/14/2017 (a) bone density on 05/06/2017 showed a T score of -3.4--osteoporosis (b) started denosumab/Prolia June 2019, repeated every 6 months   PLAN:  Milianna is now a little over a year out from definitive surgery for her breast cancer with no evidence of disease recurrence.  This is very favorable.  She is tolerating anastrozole well and the plan is to continue that for a total of 5 years.  She is osteoporotic.  She is receiving denosumab/Prolia every 6 months, with a dose due today.  She tolerates that well.  We will repeat a bone density on March 2021.  She is still depressed from the loss of her dog.  Today we discussed multiple breeds and I suggested she consider a labral due to or golden doodle which she could train to be a therapy dog.  I strongly encouraged her plan [facility] [employer] [facility].  She will return to see me in 6 months.  She knows to call for any other issue that may develop before that visit.    [name] [name], [name] Dad, MD  02/03/18 12:16 PM Medical Oncology and Hematology [facility] [address] [place], [place] [address] Tel. [phone]    Fax. [phone]   I, Jacqualyn Posey am acting as a Education administrator for Chauncey Cruel, MD.   I, Lurline Del MD, have reviewed the above documentation for accuracy and completeness, and I agree with the above.

## 2018-02-03 ENCOUNTER — Telehealth: Payer: Self-pay | Admitting: Oncology

## 2018-02-03 ENCOUNTER — Inpatient Hospital Stay: Payer: Medicare Other | Attending: Oncology

## 2018-02-03 ENCOUNTER — Inpatient Hospital Stay (HOSPITAL_BASED_OUTPATIENT_CLINIC_OR_DEPARTMENT_OTHER): Payer: Medicare Other | Admitting: Oncology

## 2018-02-03 ENCOUNTER — Inpatient Hospital Stay: Payer: Medicare Other

## 2018-02-03 VITALS — BP 122/80 | HR 99 | Temp 97.9°F | Resp 18 | Ht 64.0 in | Wt 149.7 lb

## 2018-02-03 DIAGNOSIS — M810 Age-related osteoporosis without current pathological fracture: Secondary | ICD-10-CM

## 2018-02-03 DIAGNOSIS — C50012 Malignant neoplasm of nipple and areola, left female breast: Secondary | ICD-10-CM

## 2018-02-03 DIAGNOSIS — C50912 Malignant neoplasm of unspecified site of left female breast: Secondary | ICD-10-CM

## 2018-02-03 DIAGNOSIS — Z79811 Long term (current) use of aromatase inhibitors: Secondary | ICD-10-CM

## 2018-02-03 DIAGNOSIS — Z170 Estrogen receptor positive status [ER+]: Secondary | ICD-10-CM

## 2018-02-03 DIAGNOSIS — I2510 Atherosclerotic heart disease of native coronary artery without angina pectoris: Secondary | ICD-10-CM | POA: Diagnosis not present

## 2018-02-03 DIAGNOSIS — Z7982 Long term (current) use of aspirin: Secondary | ICD-10-CM

## 2018-02-03 DIAGNOSIS — Z95828 Presence of other vascular implants and grafts: Secondary | ICD-10-CM

## 2018-02-03 DIAGNOSIS — C50412 Malignant neoplasm of upper-outer quadrant of left female breast: Secondary | ICD-10-CM

## 2018-02-03 LAB — CBC WITH DIFFERENTIAL/PLATELET
ABS IMMATURE GRANULOCYTES: 0 10*3/uL (ref 0.00–0.07)
Basophils Absolute: 0 10*3/uL (ref 0.0–0.1)
Basophils Relative: 0 %
EOS PCT: 3 %
Eosinophils Absolute: 0.1 10*3/uL (ref 0.0–0.5)
HEMATOCRIT: 40.1 % (ref 36.0–46.0)
Hemoglobin: 13.3 g/dL (ref 12.0–15.0)
IMMATURE GRANULOCYTES: 0 %
LYMPHS ABS: 1.6 10*3/uL (ref 0.7–4.0)
LYMPHS PCT: 31 %
MCH: 32.2 pg (ref 26.0–34.0)
MCHC: 33.2 g/dL (ref 30.0–36.0)
MCV: 97.1 fL (ref 80.0–100.0)
Monocytes Absolute: 0.5 10*3/uL (ref 0.1–1.0)
Monocytes Relative: 10 %
NEUTROS ABS: 2.9 10*3/uL (ref 1.7–7.7)
NEUTROS PCT: 56 %
NRBC: 0 % (ref 0.0–0.2)
Platelets: 242 10*3/uL (ref 150–400)
RBC: 4.13 MIL/uL (ref 3.87–5.11)
RDW: 12.6 % (ref 11.5–15.5)
WBC: 5.1 10*3/uL (ref 4.0–10.5)

## 2018-02-03 LAB — COMPREHENSIVE METABOLIC PANEL
ALK PHOS: 55 U/L (ref 38–126)
ALT: 14 U/L (ref 0–44)
ANION GAP: 10 (ref 5–15)
AST: 22 U/L (ref 15–41)
Albumin: 3.9 g/dL (ref 3.5–5.0)
BILIRUBIN TOTAL: 0.8 mg/dL (ref 0.3–1.2)
BUN: 19 mg/dL (ref 8–23)
CO2: 21 mmol/L — AB (ref 22–32)
Calcium: 9.4 mg/dL (ref 8.9–10.3)
Chloride: 109 mmol/L (ref 98–111)
Creatinine, Ser: 1.25 mg/dL — ABNORMAL HIGH (ref 0.44–1.00)
GFR calc Af Amer: 48 mL/min — ABNORMAL LOW (ref >60)
GFR calc non Af Amer: 41 mL/min — ABNORMAL LOW (ref >60)
GLUCOSE: 133 mg/dL — AB (ref 70–99)
Potassium: 4.1 mmol/L (ref 3.5–5.1)
SODIUM: 140 mmol/L (ref 135–145)
Total Protein: 7.2 g/dL (ref 6.5–8.1)

## 2018-02-03 MED ORDER — DENOSUMAB 60 MG/ML ~~LOC~~ SOSY
60.0000 mg | PREFILLED_SYRINGE | Freq: Once | SUBCUTANEOUS | Status: AC
  Administered 2018-02-03: 60 mg via SUBCUTANEOUS

## 2018-02-03 NOTE — Patient Instructions (Signed)

## 2018-02-03 NOTE — Telephone Encounter (Signed)
Gave avs and calendar ° °

## 2018-03-26 ENCOUNTER — Encounter: Payer: Self-pay | Admitting: Gastroenterology

## 2018-04-10 ENCOUNTER — Ambulatory Visit (INDEPENDENT_AMBULATORY_CARE_PROVIDER_SITE_OTHER): Payer: Medicare Other | Admitting: Gastroenterology

## 2018-04-10 ENCOUNTER — Encounter: Payer: Self-pay | Admitting: Gastroenterology

## 2018-04-10 VITALS — BP 102/70 | HR 92 | Ht 63.5 in | Wt 149.4 lb

## 2018-04-10 DIAGNOSIS — R194 Change in bowel habit: Secondary | ICD-10-CM | POA: Diagnosis not present

## 2018-04-10 MED ORDER — SUPREP BOWEL PREP KIT 17.5-3.13-1.6 GM/177ML PO SOLN: 1.0000 | ORAL | 0 refills | Status: DC

## 2018-04-10 NOTE — Patient Instructions (Signed)
If you are age 79 or older, your body mass index should be between 23-30. Your Body mass index is 26.05 kg/m. If this is out of the aforementioned range listed, please consider follow up with your Primary Care Provider.  If you are age 35 or younger, your body mass index should be between 19-25. Your Body mass index is 26.05 kg/m. If this is out of the aformentioned range listed, please consider follow up with your Primary Care Provider.    You have been scheduled for a colonoscopy. Please follow written instructions given to you at your visit today.  Please pick up your prep supplies at the pharmacy within the next 1-3 days. If you use inhalers (even only as needed), please bring them with you on the day of your procedure. Your physician has requested that you go to www.[email or web address].com and enter the access code given to you at your visit today. This web site gives a general overview about your procedure. However, you should still follow specific instructions given to you by our office regarding your preparation for the procedure.   Thank you for choosing me and [facility].  Dr.Beavers

## 2018-04-10 NOTE — Progress Notes (Signed)
 Referring Provider: Plotnikov, Aleksei V, MD Primary Care Physician:  Plotnikov, Aleksei V, MD   Reason for Consultation:  Constipation   IMPRESSION:  Change in bowel habits Normal screening colonoscopy 2009 ([facility]hoff) Sigmoid diverticulosis by CT Enlarged left pelvic varices with dilated left ovarian vein on CT suggesting pelvic congestion syndrome    - although pelvic congestion syndrome not reported in menopausal women  Etiology of symptoms is unclear. Colonoscopy recommended to evaluate for polyp, mass,   PLAN: Colonoscopy Daily stool bulking agent with psyllium (citrucel) Obtain recent records from Dr. Plotnikov Consider referral to GYN to review the CT scan findings  I consented the patient at the bedside today discussing the risks, benefits, and alternatives to endoscopic evaluation. In particular, we discussed the risks that include, but are not limited to, reaction to medication, cardiopulmonary compromise, bleeding requiring blood transfusion, aspiration resulting in pneumonia, perforation requiring surgery, lack of diagnosis, severe illness requiring hospitalization, and even death. We reviewed the risk of missed lesion including polyps or even cancer. The patient acknowledges these risks and asks that we proceed.   HPI: [name] [name] is a 78 y.o. female seen in consultation at Dr. Plotnikov. The history is obtained through the patient and review of her electronic health record. She has depression, anxiety, hypercholesterolemia, osteopenia, hypertension, right breast cancer with history of local recurrence, left breast cancer, history of papillary thyroid cancer s/p thyroidectomy. She had a normal screening colonoscopy 2009 with Dr. Medhoff.   No prior GI symptoms.    Developed GI symptoms when she turned 78. Foods causing some abdominal pain that she hasn't experienced before.  Worse with chocololate. BM daily to BID. Frequency has declined. No blood or mucous. When  lying supine she will have diffuse, non-radiating abdominal pain. Using enemas. She is concerned that it is stool because she can feel the feces moving through when she pushes on her abdomen.   Unintetional weight gain. Baseline weight is 129. She has gained 10 pounds. No other associated symptoms. No identified exacerbating or relieving features.   I have personally reviewed the images from her abdomen and pelvis CT with contrast on 10/10/2017 for acute abdominal pain showed sigmoid diverticulosis without inflammation, enlarged left pelvic varices are noted with dilated left ovarian vein suggesting pelvic congestion syndrome. No acute abnormality was noted in the abdomen or pelvis.  Her mother had 3 feet of colon removed in her 80s due to diverticulitis. No other known family history of colon cancer or polyps.   Past Medical History:  Diagnosis Date  . Allergic rhinitis   . Anxiety   . Breast cancer (HCC) hx 2004   recurrent 2006 DR Magrinat  . Family history of breast cancer   . 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 HTN (hypertension)   . Hyperlipidemia   . Hypothyroidism   . Personal history of chemotherapy 2002  . Personal history of radiation therapy 2002  . Psoriasis   . S/P thyroidectomy 07/2005   2 cm largest diameter (1 other tiny focus)/ i-131 rx 99 [facility] 08/2005  . Thyroid cancer (HCC)    Papillary Stage 1 - Dr Ellison  . Vitamin B12 deficiency 2009  . Vitamin D deficiency 2009    Past Surgical History:  Procedure Laterality Date  . BREAST LUMPECTOMY    . BREAST LUMPECTOMY WITH RADIOACTIVE SEED AND SENTINEL LYMPH NODE BIOPSY Left 09/11/2016   Procedure: LEFT BREAST LUMPECTOMY WITH   RADIOACTIVE SEED AND LEFT AXILLARY SENTINEL LYMPH NODE BIOPSY WITH BLUE DYE INJECTION;  Surgeon: Ingram, Haywood, MD;  Location: [facility];  Service: General;  Laterality: Left;  . IR FLUORO GUIDE PORT  INSERTION RIGHT  09/13/2016  . IR REMOVAL TUN ACCESS W/ PORT W/O FL MOD SED  12/30/2016  . IR US GUIDE VASC ACCESS RIGHT  09/13/2016  . MASTECTOMY     Right  . PORTACATH PLACEMENT N/A 09/11/2016   Procedure: ATTEMPTED INSERTION PORT-A-CATH WITH ULTRA SOUND GUIDANCE;  Surgeon: Ingram, Haywood, MD;  Location: [facility];  Service: General;  Laterality: N/A;  . REDUCTION MAMMAPLASTY Left 2005  . THYROIDECTOMY  2007    Current Outpatient Medications  Medication Sig Dispense Refill  . ALPRAZolam (XANAX) 1 MG tablet TAKE 0.5-1 TABLETS (0.5-1 MG TOTAL) BY MOUTH 2 (TWO) TIMES DAILY AS NEEDED FOR ANXIETY. 60 tablet 2  . anastrozole (ARIMIDEX) 1 MG tablet Take 1 tablet (1 mg total) by mouth daily. 90 tablet 4  . aspirin EC 81 MG tablet Take 1 tablet (81 mg total) by mouth daily. 100 tablet 3  . cholecalciferol (VITAMIN D) 1000 units tablet Take 2 tablets (2,000 Units total) by mouth daily. 100 tablet 3  . Cyanocobalamin (VITAMIN B-12 IJ) Inject 1,000 mcg as directed every 30 (thirty) days.    . DULoxetine (CYMBALTA) 60 MG capsule TAKE 1 CAPSULE BY MOUTH EVERY MORNING 90 capsule 1  . levothyroxine (SYNTHROID, LEVOTHROID) 112 MCG tablet TAKE 1 TABLET (112 MCG TOTAL) BY MOUTH DAILY. 90 tablet 3  . rosuvastatin (CRESTOR) 20 MG tablet As directed 30 tablet 5  . telmisartan (MICARDIS) 40 MG tablet Take 1 tablet (40 mg total) by mouth daily. (Patient taking differently: Take 40 mg by mouth as needed. ) 90 tablet 3  . venlafaxine XR (EFFEXOR-XR) 75 MG 24 hr capsule Take 1 capsule (75 mg total) daily with breakfast by mouth. 30 capsule 6  . vitamin B-12 (CYANOCOBALAMIN) 1000 MCG tablet Take 1,000 mcg by mouth every other day.    . SUPREP BOWEL PREP KIT 17.5-3.13-1.6 GM/177ML SOLN Take 1 kit by mouth as directed. For colonoscopy prep 2 Bottle 0   No current facility-administered medications for this visit.     Allergies as of 04/10/2018 - Review Complete 04/10/2018  Allergen Reaction Noted  .  Pneumococcal vaccines Other (See Comments) 05/07/2016  . Sulfa antibiotics Other (See Comments) 09/05/2016  . Sulfacetamide sodium-sulfur    . Tape Itching, Dermatitis, and Rash 09/05/2016  . Tegaderm ag mesh [silver] Itching and Rash 09/18/2016    Family History  Problem Relation Age of Onset  . Stroke Mother   . Allergies Mother   . Asthma Mother   . Clotting disorder Mother   . Heart disease Father 79       MI  . Allergies Sister   . Asthma Sister   . Breast cancer Sister 66       Lymphoma 75; currently 76  . Lymphoma Sister   . Asthma Brother   . Leukemia Brother        dx 40s  . Allergies Daughter   . Asthma Daughter   . Allergies Sister   . Breast cancer Sister 63       currently 79  . Kidney cancer Paternal Aunt        kidney ca; deceased 81  . Liver cancer Paternal Uncle        unk. primary ("liver")  . Throat cancer Maternal Grandmother          deceased 72  . Lung cancer Maternal Grandfather        deceased 69  . Pancreatic cancer Paternal Grandfather        deceased 81  . Cancer Paternal Aunt        "abdominal"; deceased 85    Social History   Socioeconomic History  . Marital status: Married    Spouse name: Not on file  . Number of children: 2  . Years of education: Not on file  . Highest education level: Not on file  Occupational History  . 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 Financial resource strain: Not on file  . Food insecurity:    Worry: Not on file    Inability: Not on file  . Transportation needs:    Medical: Not on file    Non-medical: Not on file  Tobacco Use  . Smoking status: Never Smoker  . Smokeless tobacco: Never Used  Substance and Sexual Activity  . Alcohol use: No  . Drug use: No  . Sexual activity: Not Currently  Lifestyle  . Physical activity:    Days per week: Not on file    Minutes per session: Not on file  . Stress: Not on file  Relationships  .  Social connections:    Talks on phone: Not on file    Gets together: Not on file    Attends religious service: Not on file    Active member of club or organization: Not on file    Attends meetings of clubs or organizations: Not on file    Relationship status: Not on file  . Intimate partner violence:    Fear of current or ex partner: Not on file    Emotionally abused: Not on file    Physically abused: Not on file    Forced sexual activity: Not on file  Other Topics Concern  . Not on file  Social History Narrative   GI - Dr MEdoff   Depression - Dr Kaur   GYN - Dr D Colins       Review of Systems: 12 system ROS is negative except as noted above with the additions of allergies, anxiety, and depression.  Filed Weights   04/10/18 1044  Weight: 149 lb 6 oz (67.8 kg)    Physical Exam: Vital signs were reviewed. General:   Alert, well-nourished, pleasant and cooperative in NAD Head:  Normocephalic and atraumatic. Eyes:  Sclera clear, no icterus.   Conjunctiva pink. Mouth:  No deformity or lesions.   Neck:  Supple; no thyromegaly. Lungs:  Clear throughout to auscultation.   No wheezes.  Heart:  Regular rate and rhythm; no murmurs Abdomen:  Soft, nontender, normal bowel sounds. No rebound or guarding. No hepatosplenomegaly Rectal:  Deferred  Msk:  Symmetrical without gross deformities. Extremities:  No gross deformities or edema. Neurologic:  Alert and  oriented x4;  grossly nonfocal Skin:  No rash or bruise. Psych:  Alert and cooperative. Normal mood and affect.   Kimberly L. Beavers, MD, MPH [facility] 04/10/2018, 6:26 PM     

## 2018-04-16 ENCOUNTER — Ambulatory Visit (AMBULATORY_SURGERY_CENTER): Payer: Medicare Other | Admitting: Gastroenterology

## 2018-04-16 ENCOUNTER — Encounter: Payer: Self-pay | Admitting: Gastroenterology

## 2018-04-16 VITALS — BP 95/58 | HR 83 | Resp 18 | Ht 63.0 in | Wt 149.0 lb

## 2018-04-16 DIAGNOSIS — R194 Change in bowel habit: Secondary | ICD-10-CM | POA: Diagnosis not present

## 2018-04-16 MED ORDER — SODIUM CHLORIDE 0.9 % IV SOLN: 500.0000 mL | Freq: Once | INTRAVENOUS | Status: DC

## 2018-04-16 NOTE — Progress Notes (Signed)
PT taken to PACU. Monitors in place. VSS. Report given to RN. 

## 2018-04-16 NOTE — Op Note (Signed)
[place] Patient Name: [name] [name] Procedure Date: 04/16/2018 3:09 PM MRN: [id number] Endoscopist: Thornton Park MD, MD Age: 78 Referring MD:  Date of Birth: 05/24/1940 Gender: Female Account #: [id number] Procedure:                Colonoscopy Indications:              Change in bowel habits Medicines:                See the Anesthesia note for documentation of the                            administered medications Procedure:                Pre-Anesthesia Assessment:                           - Prior to the procedure, a History and Physical                            was performed, and patient medications and                            allergies were reviewed. The patient's tolerance of                            previous anesthesia was also reviewed. The risks                            and benefits of the procedure and the sedation                            options and risks were discussed with the patient.                            All questions were answered, and informed consent                            was obtained. Prior Anticoagulants: The patient has                            taken no previous anticoagulant or antiplatelet                            agents. ASA Grade Assessment: III - A patient with                            severe systemic disease. After reviewing the risks                            and benefits, the patient was deemed in                            satisfactory condition to undergo the procedure.  After obtaining informed consent, the colonoscope                            was passed under direct vision. Throughout the                            procedure, the patient's blood pressure, pulse, and                            oxygen saturations were monitored continuously. The                            Colonoscope was introduced through the anus and                            advanced to the the cecum,  identified by                            appendiceal orifice and ileocecal valve. The                            colonoscopy was performed with moderate difficulty                            due to poor bowel prep, a redundant colon and a                            tortuous colon. Successful completion of the                            procedure was aided by applying abdominal pressure.                            The patient tolerated the procedure well. The                            ileocecal valve, appendiceal orifice, and rectum                            were photographed. Scope In: 3:20:28 PM Scope Out: 3:33:21 PM Scope Withdrawal Time: 0 hours 7 minutes 57 seconds  Total Procedure Duration: 0 hours 12 minutes 53 seconds  Findings:                 The perianal and digital rectal examinations were                            normal.                           A small amount of stool was found in the entire                            colon, precluding visualization. Lavage of the area  was performed using a moderate amount, resulting in                            incomplete clearance with fair visualization.                           The exam was otherwise without abnormality on                            direct and retroflexion views. Complications:            No immediate complications. Estimated Blood Loss:     Estimated blood loss: none. Impression:               - Stool in the entire examined colon. This limited                            a meaningful evaluation for both small and large                            polyps.                           - The examination was otherwise normal on direct                            and retroflexion views.                           - No specimens collected.                           - No source recent symptoms identified on this                            examination. Recommendation:           - Patient has a  contact number available for                            emergencies. The signs and symptoms of potential                            delayed complications were discussed with the                            patient. Return to normal activities tomorrow.                            Written discharge instructions were provided to the                            patient.                           - Resume regular diet.                           -  Continue present medications.                           - No repeat colonoscopy planned at this time.                           - Return to the office as needed. Thornton Park MD, MD 04/16/2018 3:42:54 PM This report has been signed electronically.

## 2018-04-16 NOTE — Patient Instructions (Signed)
Continue present medications. Return to GI office as needed.     YOU HAD AN ENDOSCOPIC PROCEDURE TODAY AT [facility]:   Refer to the procedure report that was given to you for any specific questions about what was found during the examination.  If the procedure report does not answer your questions, please call your gastroenterologist to clarify.  If you requested that your care partner not be given the details of your procedure findings, then the procedure report has been included in a sealed envelope for you to review at your convenience later.  YOU SHOULD EXPECT: Some feelings of bloating in the abdomen. Passage of more gas than usual.  Walking can help get rid of the air that was put into your GI tract during the procedure and reduce the bloating. If you had a lower endoscopy (such as a colonoscopy or flexible sigmoidoscopy) you may notice spotting of blood in your stool or on the toilet paper. If you underwent a bowel prep for your procedure, you may not have a normal bowel movement for a few days.  Please Note:  You might notice some irritation and congestion in your nose or some drainage.  This is from the oxygen used during your procedure.  There is no need for concern and it should clear up in a day or so.  SYMPTOMS TO REPORT IMMEDIATELY:   Following lower endoscopy (colonoscopy or flexible sigmoidoscopy):  Excessive amounts of blood in the stool  Significant tenderness or worsening of abdominal pains  Swelling of the abdomen that is new, acute  Fever of 100F or higher    For urgent or emergent issues, a gastroenterologist can be reached at any hour by calling [phone].   DIET:  We do recommend a small meal at first, but then you may proceed to your regular diet.  Drink plenty of fluids but you should avoid alcoholic beverages for 24 hours.  ACTIVITY:  You should plan to take it easy for the rest of today and you should NOT DRIVE or use heavy machinery  until tomorrow (because of the sedation medicines used during the test).    FOLLOW UP: Our staff will call the number listed on your records the next business day following your procedure to check on you and address any questions or concerns that you may have regarding the information given to you following your procedure. If we do not reach you, we will leave a message.  However, if you are feeling well and you are not experiencing any problems, there is no need to return our call.  We will assume that you have returned to your regular daily activities without incident.  If any biopsies were taken you will be contacted by phone or by letter within the next 1-3 weeks.  Please call us at [phone] if you have not heard about the biopsies in 3 weeks.    SIGNATURES/CONFIDENTIALITY: You and/or your care partner have signed paperwork which will be entered into your electronic medical record.  These signatures attest to the fact that that the information above on your After Visit Summary has been reviewed and is understood.  Full responsibility of the confidentiality of this discharge information lies with you and/or your care-partner.

## 2018-04-17 ENCOUNTER — Telehealth: Payer: Self-pay

## 2018-04-17 NOTE — Telephone Encounter (Signed)
  Follow up Call-  Call back number 04/16/2018  Post procedure Call Back phone  # [phone]  Permission to leave phone message Yes  Some recent data might be hidden     Patient questions:  Do you have a fever, pain , or abdominal swelling? No. Pain Score  0 *  Have you tolerated food without any problems? Yes.    Have you been able to return to your normal activities? Yes.    Do you have any questions about your discharge instructions: Diet   No. Medications  No. Follow up visit  No.  Do you have questions or concerns about your Care? No.  Actions: * If pain score is 4 or above: No action needed, pain <4.

## 2018-04-23 ENCOUNTER — Encounter: Payer: Self-pay | Admitting: Gastroenterology

## 2018-05-13 ENCOUNTER — Other Ambulatory Visit: Payer: Self-pay | Admitting: Internal Medicine

## 2018-05-14 ENCOUNTER — Other Ambulatory Visit: Payer: Self-pay | Admitting: Oncology

## 2018-05-14 ENCOUNTER — Other Ambulatory Visit: Payer: Self-pay | Admitting: Internal Medicine

## 2018-06-11 ENCOUNTER — Telehealth: Payer: Self-pay | Admitting: Internal Medicine

## 2018-06-11 NOTE — Telephone Encounter (Signed)
You can use over-the-counter  "cold" medicines  such as "Afrin" nasal spray for nasal congestion as directed. Use " Delsym" or" Robitussin" cough syrup varietis for cough.  You can use plain "Tylenol" or "Advil" for fever, chills and achyness. Use Halls or Ricola cough drops.   "Common cold" symptoms are usually triggered by a virus.  The antibiotics are usually not necessary. On average, a" viral cold" illness would take 4-7 days to resolve.   Please, make an appointment or a VOV if you are not better or if you're worse.

## 2018-06-11 NOTE — Telephone Encounter (Signed)
Pt called complaining of bilateral ear pain, right sided throat pain, sinus cavities are sore and she is dizzy and feels "drunk". She is not able to do a virtual appt please advise

## 2018-06-11 NOTE — Telephone Encounter (Signed)
Please advise, do you want pt to come into office?

## 2018-06-11 NOTE — Telephone Encounter (Signed)
Pt notified and voiced understanding 

## 2018-06-18 ENCOUNTER — Ambulatory Visit: Payer: Self-pay

## 2018-06-18 NOTE — Telephone Encounter (Signed)
Incoming call from Patient who states that she feels  Much better than she has a headache from left ear muscle down to last week .  Patient reports  pain from left ear muscle,down to  neck.  Patient request either antibiotic be called in or something for pain be called in. Nose is draining clear fluid.    Has taken excedrin for head with relief.  Reports mild  Dizziness.   That started last week .  Changing pisition  of head from left to right.     Aggravates dizziness.  Denies any other Sx.   Blood Pressure were as follows 96/81 Hr57 and W82/62/ hrate 93.` Will send request to Dr.  Oletha Blend.  Answer Assessment - Initial Assessment Questions 1. DESCRIPTION: "Describe your dizziness."   Not as bad.. 2. LIGHTHEADED: "Do you feel lightheaded?" (e.g., somewhat faint, woozy, weak upon standing)     *No Answer* 3. VERTIGO: "Do you feel like either you or the room is spinning or tilting?" (i.e. vertigo)    Denies occurs when walking.   4. SEVERITY: "How bad is it?"  "Do you feel like you are going to faint?" "Can you stand and walk?"   - MILD - walking normally   - MODERATE - interferes with normal activities (e.g., work, school)    - SEVERE - unable to stand, requires support to walk, feels like passing out now.     mild 5. ONSET:  "When did the dizziness begin?"    Last week 6. AGGRAVATING  FACTORS: "Does anything make it worse?" (e.g., standing, change in head position)    Standing.  Changing position to right right.   7. HEART RATE: "Can you tell me your heart rate?" "How many beats in 15 seconds?"  (Note: not all patients can do this)   8. CAUSE: "What do you think is causing the dizziness?"     *No Answer* 9. RECURRENT SYMPTOM: "Have you had dizziness before?" If so, ask: "When was the last time?" "What happened that time?"     *No Answer* 10. OTHER SYMPTOMS: "Do you have any other symptoms?" (e.g., fever, chest pain, vomiting, diarrhea, bleeding)      denies 11. PREGNANCY: "Is there any chance you are pregnant?" "When was your last menstrual period?"      na  Protocols used: DIZZINESS [facility]

## 2018-06-22 NOTE — Telephone Encounter (Signed)
LVM for patient to call back and set up an appointment.  

## 2018-06-22 NOTE — Telephone Encounter (Signed)
Pt needs VOV or telephone visit, please call pt to schedule

## 2018-06-25 ENCOUNTER — Ambulatory Visit (INDEPENDENT_AMBULATORY_CARE_PROVIDER_SITE_OTHER): Payer: Medicare Other | Admitting: Internal Medicine

## 2018-06-25 ENCOUNTER — Ambulatory Visit: Payer: Self-pay | Admitting: Internal Medicine

## 2018-06-25 DIAGNOSIS — H9201 Otalgia, right ear: Secondary | ICD-10-CM | POA: Diagnosis not present

## 2018-06-25 DIAGNOSIS — R42 Dizziness and giddiness: Secondary | ICD-10-CM | POA: Diagnosis not present

## 2018-06-25 DIAGNOSIS — J309 Allergic rhinitis, unspecified: Secondary | ICD-10-CM | POA: Diagnosis not present

## 2018-06-25 MED ORDER — TRIAMCINOLONE ACETONIDE 55 MCG/ACT NA AERO: 2.0000 | INHALATION_SPRAY | Freq: Every day | NASAL | 12 refills | Status: DC

## 2018-06-25 MED ORDER — MECLIZINE HCL 12.5 MG PO TABS: ORAL_TABLET | ORAL | 1 refills | Status: DC

## 2018-06-25 MED ORDER — PREDNISONE 10 MG PO TABS: ORAL_TABLET | ORAL | 0 refills | Status: DC

## 2018-06-25 MED ORDER — LORATADINE 10 MG PO TABS: 10.0000 mg | ORAL_TABLET | Freq: Every day | ORAL | 11 refills | Status: DC

## 2018-06-25 NOTE — Telephone Encounter (Signed)
Please schedule pt with Dr. Jenny Reichmann

## 2018-06-25 NOTE — Telephone Encounter (Signed)
Will Dr. Jenny Reichmann do a telephone call visit?

## 2018-06-25 NOTE — Progress Notes (Signed)
Patient ID: [name] [name], female   DOB: 09/17/1940, 78 y.o.   MRN: [id number]  Virtual Visit via Video Note after failed video visit  I connected with Rheana T Lauter on 06/25/18 at  3:00 PM EDT by a video enabled telemedicine application and verified that I am speaking with the correct person using two identifiers.  Location: Patient: at home Provider: at office   I discussed the limitations of evaluation and management by telemedicine and the availability of in person appointments. The patient expressed understanding and agreed to proceed.  History of Present Illness:  Here with 2-3 days acute onset fever, right ear pain, pressure, recurrent vertigo, headache, general weakness and malaise, with mild ST and cough, but pt denies chest pain, wheezing, increased sob or doe, orthopnea, PND, increased LE swelling, palpitations, dizziness or syncope.  Pt denies new neurological symptoms such as new headache, or facial or extremity weakness or numbness   Pt denies polydipsia, polyuria. Does have several wks ongoing nasal allergy symptoms with clearish congestion, itch and sneezing, without fever, pain, ST, cough, swelling or wheezing.   Past Medical History:  Diagnosis Date  . Allergic rhinitis   . Anxiety   . Breast cancer ([place]) hx 2004   recurrent 2006 DR Magrinat  . Family history of breast cancer   . 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 HTN (hypertension)   . Hyperlipidemia   . Hypothyroidism   . Personal history of chemotherapy 2002  . Personal history of radiation therapy 2002  . Psoriasis   . S/P thyroidectomy 07/2005   2 cm largest diameter (1 other tiny focus)/ i-131 rx 99 [facility] 08/2005  . Thyroid cancer ([place])    Papillary Stage 1 - Dr Loanne Drilling  . Vitamin B12 deficiency 2009  . Vitamin D deficiency 2009   Past Surgical History:  Procedure Laterality Date  . BREAST LUMPECTOMY    . BREAST LUMPECTOMY WITH  RADIOACTIVE SEED AND SENTINEL LYMPH NODE BIOPSY Left 09/11/2016   Procedure: LEFT BREAST LUMPECTOMY WITH RADIOACTIVE SEED AND LEFT AXILLARY SENTINEL LYMPH NODE BIOPSY WITH BLUE DYE INJECTION;  Surgeon: [name], MD;  Location: [facility];  Service: General;  Laterality: Left;  . IR FLUORO GUIDE PORT INSERTION RIGHT  09/13/2016  . IR REMOVAL TUN ACCESS W/ PORT W/O FL MOD SED  12/30/2016  . IR US GUIDE VASC ACCESS RIGHT  09/13/2016  . MASTECTOMY     Right  . PORTACATH PLACEMENT N/A 09/11/2016   Procedure: ATTEMPTED INSERTION PORT-A-CATH WITH ULTRA SOUND GUIDANCE;  Surgeon: [name], MD;  Location: [place];  Service: General;  Laterality: N/A;  . REDUCTION MAMMAPLASTY Left 2005  . THYROIDECTOMY  2007    reports that she has never smoked. She has never used smokeless tobacco. She reports that she does not drink alcohol or use drugs. family history includes Allergies in her daughter, mother, sister, and sister; Asthma in her brother, daughter, mother, and sister; Breast cancer (age of onset: 48) in her sister; Breast cancer (age of onset: 69) in her sister; Cancer in her paternal aunt; Clotting disorder in her mother; Heart disease (age of onset: 21) in her father; Kidney cancer in her paternal aunt; Leukemia in her brother; Liver cancer in her paternal uncle; Lung cancer in her maternal grandfather; Lymphoma in her sister; Pancreatic cancer in her paternal grandfather; Stroke in her mother; Throat cancer in her maternal grandmother. Allergies  Allergen Reactions  . Pneumococcal Vaccines Other (See Comments)    Was really sick   . Sulfa Antibiotics Other (See Comments)    Pt believes it was hives  . Sulfacetamide Sodium-Sulfur   . Tape Itching, Dermatitis and Rash  . Tegaderm Ag Mesh [Silver] Itching and Rash   Current Outpatient Medications on File Prior to Visit  Medication Sig Dispense Refill  . ALPRAZolam (XANAX) 1 MG tablet TAKE 0.5-1 TABLETS (0.5-1 MG  TOTAL) BY MOUTH 2 (TWO) TIMES DAILY AS NEEDED FOR ANXIETY. 60 tablet 2  . anastrozole (ARIMIDEX) 1 MG tablet TAKE 1 TABLET BY MOUTH EVERY DAY 90 tablet 4  . aspirin EC 81 MG tablet Take 1 tablet (81 mg total) by mouth daily. 100 tablet 3  . cholecalciferol (VITAMIN D) 1000 units tablet Take 2 tablets (2,000 Units total) by mouth daily. 100 tablet 3  . Cyanocobalamin (VITAMIN B-12 IJ) Inject 1,000 mcg as directed every 30 (thirty) days.    . DULoxetine (CYMBALTA) 60 MG capsule TAKE 1 CAPSULE BY MOUTH EVERY MORNING 90 capsule 1  . levothyroxine (SYNTHROID, LEVOTHROID) 112 MCG tablet TAKE 1 TABLET (112 MCG TOTAL) BY MOUTH DAILY. 90 tablet 3  . rosuvastatin (CRESTOR) 20 MG tablet As directed 30 tablet 5  . telmisartan (MICARDIS) 40 MG tablet TAKE 1 TABLET BY MOUTH EVERY DAY 90 tablet 3  . venlafaxine XR (EFFEXOR-XR) 75 MG 24 hr capsule Take 1 capsule (75 mg total) daily with breakfast by mouth. 30 capsule 6  . vitamin B-12 (CYANOCOBALAMIN) 1000 MCG tablet Take 1,000 mcg by mouth every other day.     No current facility-administered medications on file prior to visit.     Observations/Objective: Not able due to phone only Lab Results  Component Value Date   WBC 5.1 02/03/2018   HGB 13.3 02/03/2018   HCT 40.1 02/03/2018   PLT 242 02/03/2018   GLUCOSE 133 (H) 02/03/2018   CHOL 205 (H) 06/05/2015   TRIG 257.0 (H) 06/05/2015   HDL 44.60 06/05/2015   LDLDIRECT 125.0 06/05/2015   LDLCALC 122 (H) 12/04/2007   ALT 14 02/03/2018   AST 22 02/03/2018   NA 140 02/03/2018   K 4.1 02/03/2018   CL 109 02/03/2018   CREATININE 1.25 (H) 02/03/2018   BUN 19 02/03/2018   CO2 21 (L) 02/03/2018   TSH 2.16 06/18/2017   INR 0.98 12/30/2016   HGBA1C 5.4 12/05/2010   Assessment and Plan: See notes  Follow Up Instructions: See notes   I discussed the assessment and treatment plan with the patient. The patient was provided an opportunity to ask questions and all were answered. The patient agreed with  the plan and demonstrated an understanding of the instructions.   The patient was advised to call back or seek an in-person evaluation if the symptoms worsen or if the condition fails to improve as anticipated.  Cathlean Cower, MD

## 2018-06-25 NOTE — Telephone Encounter (Signed)
Pt called in c/o right ear hurting, sore throat, dry cough and runny nose.   All the secretions are clear from nose that started yesterday afternoon.    She has been having vertigo since last week.  It is much better now.   She is taking 1/2 Xanax and a Dramamine at night for the vertigo which is helping.   My right ear is bothering me the most now.    No vertigo today.     See triage notes.  I called the [facility] practice and spoke with Sam.   Due to pt's symptoms they cannot see her in the office (COVID-19 pandemic).   Pt does not have a computer.  It's broken.     I asked if she had someone that could help her do a video chat call from another computer and she said,  "No".    Sam is going to have someone call her back after checking with the providers regarding her situation and symptoms.   Dr. Alain Marion is out of the office this week.  I let the pt know someone is going to call her back.    I verified her home phone number which is correct.    She was agreeable to someone calling her back.  I sent these notes to the office.   Reason for Disposition . Earache is present  Answer Assessment - Initial Assessment Questions 1. ONSET: "When did the cough begin?"      I have a dry cough.    My left and then went to right ear is hurting again.   No fever.   Sore throat.   I had vertigo last week in the mornings.   I'm taking Dramamine which is helping. My symptoms started yesterday afternoon with sore throat, etc.     I called in but they never got me because they had my cell phone number and I don't use my cell that much.       2. SEVERITY: "How bad is the cough today?"      It's worse in the morning from drainage and it's very little I cough up just in the mornings. 3. RESPIRATORY DISTRESS: "Describe your breathing."      No 4. FEVER: "Do you have a fever?" If so, ask: "What is your temperature, how was it measured, and when did it start?"     No 5. HEMOPTYSIS: "Are you coughing up any  blood?" If so ask: "How much?" (flecks, streaks, tablespoons, etc.)     No 6. TREATMENT: "What have you done so far to treat the cough?" (e.g., meds, fluids, humidifier)     Taking cough medicine and Hall's cough drops. 7. CARDIAC HISTORY: "Do you have any history of heart disease?" (e.g., heart attack, congestive heart failure)      No 8. LUNG HISTORY: "Do you have any history of lung disease?"  (e.g., pulmonary embolus, asthma, emphysema)     No  9. PE RISK FACTORS: "Do you have a history of blood clots?" (or: recent major surgery, recent prolonged travel, bedridden)     No 10. OTHER SYMPTOMS: "Do you have any other symptoms? (e.g., runny nose, wheezing, chest pain)       Runny nose is clear.  What I cough up in the mornings is clear. 11. PREGNANCY: "Is there any chance you are pregnant?" "When was your last menstrual period?"       Not asked due to age 78. TRAVEL: "Have you traveled  out of the country in the last month?" (e.g., travel history, exposures)       I've not been out of the house in 2 months.  Protocols used: COUGH - ACUTE NON-PRODUCTIVE-A-AH

## 2018-06-25 NOTE — Telephone Encounter (Signed)
Appt scheduled

## 2018-06-25 NOTE — Telephone Encounter (Signed)
Will a provider do a phone call?

## 2018-06-25 NOTE — Patient Instructions (Signed)
Please take all new medication as prescribed - the prednisone, nasacort and meclizine as needed for dizziness  Please continue all other medications as before, including the claritin  Please have the pharmacy call with any other refills you may need.  Please keep your appointments with your specialists as you may have planned

## 2018-06-27 ENCOUNTER — Encounter: Payer: Self-pay | Admitting: Internal Medicine

## 2018-06-27 DIAGNOSIS — H9201 Otalgia, right ear: Secondary | ICD-10-CM | POA: Insufficient documentation

## 2018-06-27 NOTE — Assessment & Plan Note (Signed)
With seasonal flare, for predpac asd, claritin and nasacort asd,  to f/u any worsening symptoms or concerns

## 2018-06-27 NOTE — Assessment & Plan Note (Signed)
For meclizine prn,  to f/u any worsening symptoms or concerns 

## 2018-06-27 NOTE — Assessment & Plan Note (Signed)
Mild to mod, likely otitis media, for antibx course,  to f/u any worsening symptoms or concerns

## 2018-07-13 ENCOUNTER — Telehealth: Payer: Self-pay | Admitting: Internal Medicine

## 2018-07-13 NOTE — Telephone Encounter (Signed)
Patient was seen by Dr Jenny Reichmann on 06/25/2018 for an ear ache and runny nose. She was prescribed predniSONE (DELTASONE) 10 MG tablet. She has not taken this medication yet because she did not know if she should or not due to some of the side effects.  She would like to know what Dr Alain Marion thinks before she takes it. Please advise.  Patient can be reached at [phone].

## 2018-07-14 ENCOUNTER — Ambulatory Visit: Payer: Medicare Other | Admitting: Internal Medicine

## 2018-07-14 NOTE — Telephone Encounter (Signed)
Yes, try it.  Let me know if not better.  Thanks

## 2018-07-14 NOTE — Telephone Encounter (Signed)
Please advise 

## 2018-07-15 NOTE — Telephone Encounter (Signed)
Pt informed of below.  

## 2018-07-22 ENCOUNTER — Ambulatory Visit: Payer: Medicare Other | Admitting: Internal Medicine

## 2018-08-03 ENCOUNTER — Telehealth: Payer: Self-pay | Admitting: Oncology

## 2018-08-03 NOTE — Progress Notes (Signed)
. [name]   DOB: Dec 23, 1940  MR#: [id number]  CSN#:[id number]  PCP: [name] Anger, MD Patient Care Team: [name] Anger, MD as PCP - General [name] [name], [name] Dad, MD as Consulting Physician (Oncology) [name] [name], MD as Consulting Physician (General Surgery) [name] Campbell, MD as Consulting Physician (Gastroenterology) [name] Rudd, MD as Consulting Physician (Radiation Oncology)   CHIEF COMPLAINT: Weakly estrogen receptor positive breast cancer  CURRENT TREATMENT: Anastrozole, Prolia I connected with [name] [name] on 08/04/18 at  2:00 PM EDT by telephone visit and verified that I am speaking with the correct person using two identifiers.   I discussed the limitations, risks, security and privacy concerns of performing an evaluation and management service by telemedicine and the availability of in-person appointments. I also discussed with the patient that there may be a patient responsible charge related to this service. The patient expressed understanding and agreed to proceed.   Other persons participating in the visit and their role in the encounter: scribe AH  Patient's location: home Provider's location: clinic  INTERVAL HISTORY: [name] [name] is contacted today for follow-up and treatment of her weakly estrogen receptor positive breast cancer.  She continues on anastrozole. She tolerates this well and without any noticeable side effects.    Her Prolia has been held temporarily due to COVID-19.   [name] [name]'s last bone density screening on 05/06/2017, showed a T-score of -3.4, which is considered osteoporotic.    Since her last visit here, she has not undergone any additional studies.  She will be due for mammography on or after 09/30/2018.   She sees Dr. Alain [name] on 08/12/2018 for her 6 month follow up.   REVIEW OF SYSTEMS: [name] [name] notes some social stress in regards to her sister's health. She is having neuropathy in her feet, which she has been treating with  CBD oil. She notes that her hands have been trembling some when she sets her elbows on a hard surface, which she describes more as a "wobble."  A detailed review of systems was otherwise noncontributory.    BREAST CANCER HISTORY: From the recent summary note:  I last saw [name] [name] in 2013 when she was released from follow-up from her right breast cancer recurrence in 2004. She is status post right mastectomy and adjuvant chemotherapy for that triple negative tumor. More recently, on 07/16/2016 she underwent left screening mammography at the [facility] on 07/16/2016. This found the breast density to be category B. There was a possible asymmetry in the left breast, and on 07/19/2016 she underwent left diagnostic mammography with tomography and left breast ultrasonography. This confirmed a well circumscribed oval mass at the 9:00 position of the left breast measuring approximately 0.8 cm. This was not palpable. On ultrasonography the mass measured 0.6 cm and it was located at the 9:00 radiant 4 cm from the nipple. The left axilla was sonographically benign.  On June 11 [name] [name] underwent biopsy of the left breast mass in question and this showed ([id number]) and invasive ductal carcinoma, grade 3, estrogen receptor 5% positive with weak staining intensity, progesterone receptor negative, with an MIB-1 of 80%, and HER-2 nonamplified, the signals ratio being 1.46 and the number per cell 1.90.  Her subsequent history is as detailed below.   PAST MEDICAL HISTORY: Past Medical History:  Diagnosis Date  . Allergic rhinitis   . Anxiety   . Breast cancer ([place]) hx 2004   recurrent 2006 DR [name] [name]  . Family history of breast cancer   . 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 HTN (hypertension)   . Hyperlipidemia   . Hypothyroidism   . Personal history of chemotherapy 2002  . Personal history of radiation therapy 2002  . Psoriasis    . S/P thyroidectomy 07/2005   2 cm largest diameter (1 other tiny focus)/ i-131 rx 99 [facility] 08/2005  . Thyroid cancer ([name] [name])    Papillary Stage 1 - Dr [name] [name]  . Vitamin B12 deficiency 2009  . Vitamin D deficiency 2009    PAST SURGICAL HISTORY: Past Surgical History:  Procedure Laterality Date  . BREAST LUMPECTOMY    . BREAST LUMPECTOMY WITH RADIOACTIVE SEED AND SENTINEL LYMPH NODE BIOPSY Left 09/11/2016   Procedure: LEFT BREAST LUMPECTOMY WITH RADIOACTIVE SEED AND LEFT AXILLARY SENTINEL LYMPH NODE BIOPSY WITH BLUE DYE INJECTION;  Surgeon: [name] [name], MD;  Location: [facility];  Service: General;  Laterality: Left;  . IR FLUORO GUIDE PORT INSERTION RIGHT  09/13/2016  . IR REMOVAL TUN ACCESS W/ PORT W/O FL MOD SED  12/30/2016  . IR US GUIDE VASC ACCESS RIGHT  09/13/2016  . MASTECTOMY     Right  . PORTACATH PLACEMENT N/A 09/11/2016   Procedure: ATTEMPTED INSERTION PORT-A-CATH WITH ULTRA SOUND GUIDANCE;  Surgeon: [name] [name], MD;  Location: [facility];  Service: General;  Laterality: N/A;  . REDUCTION MAMMAPLASTY Left 2005  . THYROIDECTOMY  2007    FAMILY HISTORY Family History  Problem Relation Age of Onset  . Stroke Mother   . Allergies Mother   . Asthma Mother   . Clotting disorder Mother   . Heart disease Father 41       MI  . Allergies Sister   . Asthma Sister   . Breast cancer Sister 38       Lymphoma 67; currently 78  . Lymphoma Sister   . Asthma Brother   . Leukemia Brother        dx 80s  . Allergies Daughter   . Asthma Daughter   . Allergies Sister   . Breast cancer Sister 27       currently 65  . Kidney cancer Paternal Aunt        kidney ca; deceased 48  . Liver cancer Paternal Uncle        unk. primary ("liver")  . Throat cancer Maternal Grandmother        deceased 107  . Lung cancer Maternal Grandfather        deceased 69  . Pancreatic cancer Paternal Grandfather        deceased 64  . Cancer Paternal Aunt         "abdominal"; deceased 37  Family history includes a sister, a paternal aunt, and a paternal cousin with breast cancer all older then 26 at diagnosis  GYN HISTORY: Menarche age 53, first live birth age 63. She is GX P3. She went through the change of life age 36. She did not take hormone replacement.  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 Her husband [name] is retired from [employer]. Her children are [name], who works for time [employer] in [place], and Shelby, who lives in [place] and works for [employer]. The third child, [name], died in an automobile accident at age 82. The patient has one granddaughter. Her dog Sophie, just passed away at 30  years old. 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 Smoking status: Never Smoker  . Smokeless tobacco: Never Used  Substance Use Topics  . Alcohol use: No  . Drug use: No     Colonoscopy:  PAP:  Bone density: 05/06/2017 showed a T score of -3.4 osteoporosis  Lipid panel:  Allergies  Allergen Reactions  . Pneumococcal Vaccines Other (See Comments)    Was really sick   . Sulfa Antibiotics Other (See Comments)    Pt believes it was hives  . Sulfacetamide Sodium-Sulfur   . Tape Itching, Dermatitis and Rash  . Tegaderm Ag Mesh [Silver] Itching and Rash    Current Outpatient Medications  Medication Sig Dispense Refill  . ALPRAZolam (XANAX) 1 MG tablet TAKE 0.5-1 TABLETS (0.5-1 MG TOTAL) BY MOUTH 2 (TWO) TIMES DAILY AS NEEDED FOR ANXIETY. 60 tablet 2  . anastrozole (ARIMIDEX) 1 MG tablet TAKE 1 TABLET BY MOUTH EVERY DAY 90 tablet 4  . aspirin EC 81 MG tablet Take 1 tablet (81 mg total) by mouth daily. 100 tablet 3  . cholecalciferol (VITAMIN D) 1000 units tablet Take 2 tablets (2,000 Units total) by mouth daily. 100 tablet 3  . Cyanocobalamin (VITAMIN B-12 IJ) Inject 1,000 mcg as directed every 30 (thirty) days.    .  DULoxetine (CYMBALTA) 60 MG capsule TAKE 1 CAPSULE BY MOUTH EVERY MORNING 90 capsule 1  . levothyroxine (SYNTHROID, LEVOTHROID) 112 MCG tablet TAKE 1 TABLET (112 MCG TOTAL) BY MOUTH DAILY. 90 tablet 3  . loratadine (CLARITIN) 10 MG tablet Take 1 tablet (10 mg total) by mouth daily. 30 tablet 11  . meclizine (ANTIVERT) 12.5 MG tablet 1-2 tab by mouth every 8 hrs as needed 40 tablet 1  . predniSONE (DELTASONE) 10 MG tablet 3 tabs by mouth per day for 3 days,2tabs per day for 3 days,1tab per day for 3 days 18 tablet 0  . rosuvastatin (CRESTOR) 20 MG tablet As directed 30 tablet 5  . telmisartan (MICARDIS) 40 MG tablet TAKE 1 TABLET BY MOUTH EVERY DAY 90 tablet 3  . triamcinolone (NASACORT) 55 MCG/ACT AERO nasal inhaler Place 2 sprays into the nose daily. 1 Inhaler 12  . venlafaxine XR (EFFEXOR-XR) 75 MG 24 hr capsule Take 1 capsule (75 mg total) daily with breakfast by mouth. 30 capsule 6  . vitamin B-12 (CYANOCOBALAMIN) 1000 MCG tablet Take 1,000 mcg by mouth every other day.     No current facility-administered medications for this visit.     OBJECTIVE: Older white woman in no acute distress  There were no vitals filed for this visit.   There is no height or weight on file to calculate BMI.    ECOG FS: 1  LAB RESULTS: Lab Results  Component Value Date   WBC 5.1 02/03/2018   NEUTROABS 2.9 02/03/2018   HGB 13.3 02/03/2018   HCT 40.1 02/03/2018   MCV 97.1 02/03/2018   PLT 242 02/03/2018      Chemistry      Component Value Date/Time   NA 140 02/03/2018 1110   NA 139 12/11/2016 1017   K 4.1 02/03/2018 1110   K 3.7 12/11/2016 1017   CL 109 02/03/2018 1110   CL 104 10/09/2011 1403   CO2 21 (L) 02/03/2018 1110   CO2 22 12/11/2016 1017   BUN 19 02/03/2018 1110   BUN 16.9 12/11/2016 1017   CREATININE 1.25 (H) 02/03/2018 1110   CREATININE 1.2 (H) 12/11/2016 1017  Component Value Date/Time   CALCIUM 9.4 02/03/2018 1110   CALCIUM 9.0 12/11/2016 1017   ALKPHOS 55 02/03/2018 1110    ALKPHOS 139 12/11/2016 1017   AST 22 02/03/2018 1110   AST 65 (H) 12/11/2016 1017   ALT 14 02/03/2018 1110   ALT 51 12/11/2016 1017   BILITOT 0.8 02/03/2018 1110   BILITOT 0.42 12/11/2016 1017       Lab Results  Component Value Date   LABCA2 16 10/09/2011    No components found for: [id number]  No results for input(s): INR in the last 168 hours.  Urinalysis    Component Value Date/Time   COLORURINE YELLOW 06/05/2015 1559   APPEARANCEUR CLEAR 06/05/2015 1559   LABSPEC 1.020 06/05/2015 1559   PHURINE 5.5 06/05/2015 1559   GLUCOSEU NEGATIVE 06/05/2015 1559   HGBUR NEGATIVE 06/05/2015 1559   BILIRUBINUR NEGATIVE 06/05/2015 1559   KETONESUR NEGATIVE 06/05/2015 1559   UROBILINOGEN 0.2 06/05/2015 1559   NITRITE NEGATIVE 06/05/2015 1559   LEUKOCYTESUR MODERATE (A) 06/05/2015 1559    STUDIES: No results found.   ASSESSMENT: 78 y.o. [name] [name] woman   (1) status post right lumpectomy and sentinel lymph node biopsy in September 2002 for multifocal breast carcinoma, triple negative.  Treated with CMF followed by radiation.   (2) Local recurrence in April 2004, status post right modified radical mastectomy with TRAM reconstruction for what proved to be a T1c N0, stage IA triple negative breast carcinoma.  Treated adjuvantly with paclitaxel and doxorubicin x4 in 2004.  Off treatment since August 2004 with no evidence of recurrence  (3) history of papillary thyroid cancer s/p thyroidectomy June 2007, s/p radioactive iodine July 2007  (4) status post left breast upper outer quadrant biopsy 08/21/2016 for a clinical T1b N0, stage IB invasive ductal carcinoma, grade 3, weakly estrogen receptor positive, progesterone receptor and HER-2 negative, with an MIB-1 of 80%.  (5) left lumpectomy and sentinel lymph node sampling 09/11/2016 found a pT1b pN0, stage IB invasive ductal carcinoma, grade 3, with negative margins.  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 (p.Gln90*) (a) there were no deleterious mutations in ALK, APC, ATM, AXIN2, BAP1, BARD1, BLM, BMPR1A, BRCA1, BRCA2, BRIP1, CASR, CDC73, CDH1, CDK4, CDKN1B, CDKN1C, CDKN2A, CEBPA, CHEK2, CTNNA1, DICER1, DIS3L2, EGFR, EPCAM, FH, FLCN, GATA2, GPC3, GREM1, HOXB13, HRAS, KIT, MAX, MEN1, MET, MITF, MLH1, MSH2, MSH3, MSH6, MUTYH, NBN, NF1, NF2, NTHL1, PALB2, PDGFRA, PHOX2B, PMS2, POLD1, POLE, POT1, PRKAR1A, PTCH1, PTEN, RAD50, RAD51C, RAD51D, RB1, RECQL4, RET, RUNX1, SDHA, SDHAF2, SDHB, SDHC, SDHD, SMAD4, SMARCA4, SMARCB1, SMARCE1, STK11, SUFU, TERC, TERT, TMEM127, TP53, TSC1, TSC2, VHL, WRN, WT1 (b) while homozygous mutations in the NPH L1 gene are associated with autosomal recessive polyposis, there is no evidence to suggest that a single mutation in this gene increases cancer risk.  (9) started anastrozole 03/14/2017 (a) bone density on 05/06/2017 showed a T score of -3.4--osteoporosis (b) started denosumab/Prolia June 2019, to be repeated every 6 months   PLAN:  [name] [name] now close to 2 years out from definitive surgery for  her breast cancer with no evidence of disease recurrence.  This is very favorable.  Everything is a little bit behind because of the pandemic.  I am scheduling her to have her mammogram late August and to see me again in early September.  She will have her next Prolia dose at that time.  The tremor she is experiencing as she describes it sounds like benign essential tremor.  She does have some neuropathy involving the feet which she experiences  chiefly at night.  I have prescribed gabapentin 100 mg at bedtime to see if that helps  Otherwise the plan is to continue anastrozole for a total of 5 years.  She knows to call for any other issue that may develop before the next visit here.   [name] [name], [name] Dad, MD  08/04/18 2:08 PM Medical Oncology and Hematology [facility] [address] [place], [place] [address] Tel. [phone]    Fax. [phone]  I, Jacqualyn Posey am acting as a Education administrator for Chauncey Cruel, MD.   I, Lurline Del MD, have reviewed the above documentation for accuracy and completeness, and I agree with the above.

## 2018-08-03 NOTE — Telephone Encounter (Signed)
Called patient regarding upcoming [facility] appointment, patient would prefer this to be a telephone visit.

## 2018-08-04 ENCOUNTER — Inpatient Hospital Stay: Payer: Medicare Other

## 2018-08-04 ENCOUNTER — Inpatient Hospital Stay: Payer: Medicare Other | Attending: Oncology | Admitting: Oncology

## 2018-08-04 ENCOUNTER — Ambulatory Visit: Payer: Medicare Other

## 2018-08-04 ENCOUNTER — Other Ambulatory Visit: Payer: Self-pay | Admitting: Internal Medicine

## 2018-08-04 DIAGNOSIS — Z7982 Long term (current) use of aspirin: Secondary | ICD-10-CM | POA: Diagnosis not present

## 2018-08-04 DIAGNOSIS — Z923 Personal history of irradiation: Secondary | ICD-10-CM

## 2018-08-04 DIAGNOSIS — Z9221 Personal history of antineoplastic chemotherapy: Secondary | ICD-10-CM | POA: Diagnosis not present

## 2018-08-04 DIAGNOSIS — C50412 Malignant neoplasm of upper-outer quadrant of left female breast: Secondary | ICD-10-CM

## 2018-08-04 DIAGNOSIS — Z79811 Long term (current) use of aromatase inhibitors: Secondary | ICD-10-CM

## 2018-08-04 DIAGNOSIS — I10 Essential (primary) hypertension: Secondary | ICD-10-CM | POA: Diagnosis not present

## 2018-08-04 DIAGNOSIS — M818 Other osteoporosis without current pathological fracture: Secondary | ICD-10-CM

## 2018-08-04 DIAGNOSIS — Z170 Estrogen receptor positive status [ER+]: Secondary | ICD-10-CM

## 2018-08-04 DIAGNOSIS — Z79899 Other long term (current) drug therapy: Secondary | ICD-10-CM | POA: Diagnosis not present

## 2018-08-04 DIAGNOSIS — E039 Hypothyroidism, unspecified: Secondary | ICD-10-CM | POA: Diagnosis not present

## 2018-08-04 MED ORDER — GABAPENTIN 100 MG PO CAPS: 100.0000 mg | ORAL_CAPSULE | Freq: Every day | ORAL | 6 refills | Status: DC

## 2018-08-04 NOTE — Telephone Encounter (Signed)
[place] Controlled Database Checked Last filled: 06/09/18 # 60 LOV w/you: 01/20/18 Next appt w/you: 08/12/18

## 2018-08-05 ENCOUNTER — Telehealth: Payer: Self-pay | Admitting: Oncology

## 2018-08-05 NOTE — Telephone Encounter (Signed)
I talk with patient regarding schedule  

## 2018-08-12 ENCOUNTER — Other Ambulatory Visit: Payer: Self-pay

## 2018-08-12 ENCOUNTER — Other Ambulatory Visit (INDEPENDENT_AMBULATORY_CARE_PROVIDER_SITE_OTHER): Payer: Medicare Other

## 2018-08-12 ENCOUNTER — Encounter: Payer: Self-pay | Admitting: Internal Medicine

## 2018-08-12 ENCOUNTER — Ambulatory Visit (INDEPENDENT_AMBULATORY_CARE_PROVIDER_SITE_OTHER): Payer: Medicare Other | Admitting: Internal Medicine

## 2018-08-12 VITALS — BP 108/64 | HR 113 | Temp 98.0°F | Ht 63.0 in | Wt 149.0 lb

## 2018-08-12 DIAGNOSIS — E785 Hyperlipidemia, unspecified: Secondary | ICD-10-CM

## 2018-08-12 DIAGNOSIS — E538 Deficiency of other specified B group vitamins: Secondary | ICD-10-CM | POA: Diagnosis not present

## 2018-08-12 DIAGNOSIS — R42 Dizziness and giddiness: Secondary | ICD-10-CM | POA: Diagnosis not present

## 2018-08-12 DIAGNOSIS — I10 Essential (primary) hypertension: Secondary | ICD-10-CM

## 2018-08-12 LAB — CBC WITH DIFFERENTIAL/PLATELET
Basophils Absolute: 0 10*3/uL (ref 0.0–0.1)
Basophils Relative: 0.5 % (ref 0.0–3.0)
Eosinophils Absolute: 0.1 10*3/uL (ref 0.0–0.7)
Eosinophils Relative: 1.2 % (ref 0.0–5.0)
HCT: 41.4 % (ref 36.0–46.0)
Hemoglobin: 13.7 g/dL (ref 12.0–15.0)
Lymphocytes Relative: 24.8 % (ref 12.0–46.0)
Lymphs Abs: 1.5 10*3/uL (ref 0.7–4.0)
MCHC: 33.2 g/dL (ref 30.0–36.0)
MCV: 96.8 fl (ref 78.0–100.0)
Monocytes Absolute: 0.7 10*3/uL (ref 0.1–1.0)
Monocytes Relative: 11.8 % (ref 3.0–12.0)
Neutro Abs: 3.8 10*3/uL (ref 1.4–7.7)
Neutrophils Relative %: 61.7 % (ref 43.0–77.0)
Platelets: 350 10*3/uL (ref 150.0–400.0)
RBC: 4.28 Mil/uL (ref 3.87–5.11)
RDW: 13.7 % (ref 11.5–15.5)
WBC: 6.2 10*3/uL (ref 4.0–10.5)

## 2018-08-12 NOTE — Progress Notes (Signed)
Subjective:  Patient ID: [name] [name], female    DOB: December 08, 1940  Age: 78 y.o. MRN: [id number]  CC: No chief complaint on file.   HPI [name] [name] presents for vertigo  F/u anxiety, Vit B12 def, Vit D def [name] [name] and her right ear with a Q-tip and it bled.  It happened several times over past few months  Outpatient Medications Prior to Visit  Medication Sig Dispense Refill   ALPRAZolam (XANAX) 1 MG tablet TAKE 0.5-1 TABLETS (0.5-1 MG TOTAL) BY MOUTH 2 (TWO) TIMES DAILY AS NEEDED FOR ANXIETY. 60 tablet 3   anastrozole (ARIMIDEX) 1 MG tablet TAKE 1 TABLET BY MOUTH EVERY DAY 90 tablet 4   aspirin EC 81 MG tablet Take 1 tablet (81 mg total) by mouth daily. 100 tablet 3   cholecalciferol (VITAMIN D) 1000 units tablet Take 2 tablets (2,000 Units total) by mouth daily. 100 tablet 3   Cyanocobalamin (VITAMIN B-12 IJ) Inject 1,000 mcg as directed every 30 (thirty) days.     DULoxetine (CYMBALTA) 60 MG capsule TAKE 1 CAPSULE BY MOUTH EVERY MORNING 90 capsule 1   gabapentin (NEURONTIN) 100 MG capsule Take 1 capsule (100 mg total) by mouth at bedtime. 60 capsule 6   levothyroxine (SYNTHROID, LEVOTHROID) 112 MCG tablet TAKE 1 TABLET (112 MCG TOTAL) BY MOUTH DAILY. 90 tablet 3   loratadine (CLARITIN) 10 MG tablet Take 1 tablet (10 mg total) by mouth daily. 30 tablet 11   meclizine (ANTIVERT) 12.5 MG tablet 1-2 tab by mouth every 8 hrs as needed 40 tablet 1   predniSONE (DELTASONE) 10 MG tablet 3 tabs by mouth per day for 3 days,2tabs per day for 3 days,1tab per day for 3 days 18 tablet 0   rosuvastatin (CRESTOR) 20 MG tablet As directed 30 tablet 5   telmisartan (MICARDIS) 40 MG tablet TAKE 1 TABLET BY MOUTH EVERY DAY 90 tablet 3   triamcinolone (NASACORT) 55 MCG/ACT AERO nasal inhaler Place 2 sprays into the nose daily. 1 Inhaler 12   venlafaxine XR (EFFEXOR-XR) 75 MG 24 hr capsule Take 1 capsule (75 mg total) daily with breakfast by mouth. 30 capsule 6   vitamin B-12  (CYANOCOBALAMIN) 1000 MCG tablet Take 1,000 mcg by mouth every other day.     No facility-administered medications prior to visit.     ROS: Review of Systems  Constitutional: Negative for activity change, appetite change, chills, fatigue and unexpected weight change.  HENT: Negative for congestion, mouth sores and sinus pressure.   Eyes: Negative for visual disturbance.  Respiratory: Negative for cough and chest tightness.   Gastrointestinal: Negative for abdominal pain and nausea.  Genitourinary: Negative for difficulty urinating, frequency and vaginal pain.  Musculoskeletal: Positive for arthralgias. Negative for back pain and gait problem.  Skin: Negative for pallor and rash.  Neurological: Positive for dizziness. Negative for tremors, weakness, numbness and headaches.  Psychiatric/Behavioral: Negative for confusion, sleep disturbance and suicidal ideas. The patient is nervous/anxious.     Objective:  BP 108/64 (BP Location: Left Arm, Patient Position: Sitting, Cuff Size: Normal)    Pulse (!) 113    Temp 98 F (36.7 C) (Oral)    Ht 5\' 3"  (1.6 m)    Wt 149 lb (67.6 kg)    SpO2 96%    BMI 26.39 kg/m   BP Readings from Last 3 Encounters:  08/12/18 108/64  04/16/18 (!) 95/58  04/10/18 102/70    Wt Readings from Last 3 Encounters:  08/12/18 149  lb (67.6 kg)  04/16/18 149 lb (67.6 kg)  04/10/18 149 lb 6 oz (67.8 kg)    Physical Exam Constitutional:      General: She is not in acute distress.    Appearance: She is well-developed.  HENT:     Head: Normocephalic.     Right Ear: External ear normal.     Left Ear: External ear normal.     Nose: Nose normal.  Eyes:     General:        Right eye: No discharge.        Left eye: No discharge.     Conjunctiva/sclera: Conjunctivae normal.     Pupils: Pupils are equal, round, and reactive to light.  Neck:     Musculoskeletal: Normal range of motion and neck supple.     Thyroid: No thyromegaly.     Vascular: No JVD.      Trachea: No tracheal deviation.  Cardiovascular:     Rate and Rhythm: Normal rate and regular rhythm.     Heart sounds: Normal heart sounds.  Pulmonary:     Effort: No respiratory distress.     Breath sounds: No stridor. No wheezing.  Abdominal:     General: Bowel sounds are normal. There is no distension.     Palpations: Abdomen is soft. There is no mass.     Tenderness: There is no abdominal tenderness. There is no guarding or rebound.  Musculoskeletal:        General: No tenderness.  Lymphadenopathy:     Cervical: No cervical adenopathy.  Skin:    Findings: No erythema or rash.  Neurological:     Cranial Nerves: No cranial nerve deficit.     Motor: No abnormal muscle tone.     Coordination: Coordination normal.     Deep Tendon Reflexes: Reflexes normal.  Psychiatric:        Behavior: Behavior normal.        Thought Content: Thought content normal.        Judgment: Judgment normal.   Dried blood in the right ear canal  Lab Results  Component Value Date   WBC 5.1 02/03/2018   HGB 13.3 02/03/2018   HCT 40.1 02/03/2018   PLT 242 02/03/2018   GLUCOSE 133 (H) 02/03/2018   CHOL 205 (H) 06/05/2015   TRIG 257.0 (H) 06/05/2015   HDL 44.60 06/05/2015   LDLDIRECT 125.0 06/05/2015   LDLCALC 122 (H) 12/04/2007   ALT 14 02/03/2018   AST 22 02/03/2018   NA 140 02/03/2018   K 4.1 02/03/2018   CL 109 02/03/2018   CREATININE 1.25 (H) 02/03/2018   BUN 19 02/03/2018   CO2 21 (L) 02/03/2018   TSH 2.16 06/18/2017   INR 0.98 12/30/2016   HGBA1C 5.4 12/05/2010    Ct Abdomen Pelvis W Contrast  Result Date: 10/10/2017 CLINICAL DATA:  Acute generalized abdominal pain. EXAM: CT ABDOMEN AND PELVIS WITH CONTRAST TECHNIQUE: Multidetector CT imaging of the abdomen and pelvis was performed using the standard protocol following bolus administration of intravenous contrast. CONTRAST:  140mL ISOVUE-300 IOPAMIDOL (ISOVUE-300) INJECTION 61% COMPARISON:  CT scan of November 10, 2009. FINDINGS:  Lower chest: No acute abnormality. Hepatobiliary: No focal liver abnormality is seen. No gallstones, gallbladder wall thickening, or biliary dilatation. Pancreas: Unremarkable. No pancreatic ductal dilatation or surrounding inflammatory changes. Spleen: Normal in size without focal abnormality. Adrenals/Urinary Tract: Stable small left adrenal adenoma. Right adrenal gland appears normal. Bilateral renal cysts are noted. No hydronephrosis or renal  obstruction is noted. No renal or ureteral calculi are noted. Urinary bladder is unremarkable. Stomach/Bowel: Stomach is within normal limits. Appendix appears normal. No evidence of bowel wall thickening, distention, or inflammatory changes. Sigmoid diverticulosis is noted without inflammation. Vascular/Lymphatic: Aortic atherosclerosis. No enlarged abdominal or pelvic lymph nodes. Reproductive: Uterus is unremarkable. No adnexal abnormality is noted. Enlarged left pelvic varices are noted with dilated left ovarian vein suggesting pelvic congestion syndrome. Other: No abdominal wall hernia or abnormality. No abdominopelvic ascites. Musculoskeletal: No acute or significant osseous findings. IMPRESSION: Sigmoid diverticulosis without inflammation. Enlarged left pelvic varices are noted with dilated left ovarian vein suggesting pelvic congestion syndrome. No acute abnormality is noted in the abdomen or pelvis. Aortic Atherosclerosis (ICD10-I70.0). Electronically Signed   By: Marijo Conception, M.D.   On: 10/10/2017 09:39    Assessment & Plan:   There are no diagnoses linked to this encounter.   No orders of the defined types were placed in this encounter.    Follow-up: No follow-ups on file.  Walker Kehr, MD

## 2018-08-12 NOTE — Patient Instructions (Signed)
Do not use Q-tips in the ears

## 2018-08-13 ENCOUNTER — Other Ambulatory Visit: Payer: Self-pay | Admitting: Internal Medicine

## 2018-08-13 LAB — HEPATIC FUNCTION PANEL
ALT: 21 U/L (ref 0–35)
AST: 27 U/L (ref 0–37)
Albumin: 4.3 g/dL (ref 3.5–5.2)
Alkaline Phosphatase: 51 U/L (ref 39–117)
Bilirubin, Direct: 0.1 mg/dL (ref 0.0–0.3)
Total Bilirubin: 1 mg/dL (ref 0.2–1.2)
Total Protein: 7.4 g/dL (ref 6.0–8.3)

## 2018-08-13 LAB — BASIC METABOLIC PANEL
BUN: 19 mg/dL (ref 6–23)
CO2: 26 mEq/L (ref 19–32)
Calcium: 9.7 mg/dL (ref 8.4–10.5)
Chloride: 106 mEq/L (ref 96–112)
Creatinine, Ser: 1.38 mg/dL — ABNORMAL HIGH (ref 0.40–1.20)
GFR: 36.84 mL/min — ABNORMAL LOW (ref >60.00)
Glucose, Bld: 77 mg/dL (ref 70–99)
Potassium: 4.3 mEq/L (ref 3.5–5.1)
Sodium: 142 mEq/L (ref 135–145)

## 2018-08-13 LAB — TSH: TSH: 0.65 u[IU]/mL (ref 0.35–4.50)

## 2018-08-13 MED ORDER — TELMISARTAN 40 MG PO TABS: 20.0000 mg | ORAL_TABLET | Freq: Every day | ORAL | 3 refills | Status: DC

## 2018-08-17 ENCOUNTER — Telehealth: Payer: Self-pay | Admitting: Internal Medicine

## 2018-08-17 MED ORDER — TRIAMCINOLONE ACETONIDE 0.5 % EX CREA: 1.0000 "application " | TOPICAL_CREAM | Freq: Four times a day (QID) | CUTANEOUS | 1 refills | Status: AC

## 2018-08-17 MED ORDER — METHYLPREDNISOLONE 4 MG PO TBPK: ORAL_TABLET | ORAL | 0 refills | Status: DC

## 2018-08-17 NOTE — Telephone Encounter (Signed)
Patient has poison oak on face and hands, please advise on RX

## 2018-08-17 NOTE — Addendum Note (Signed)
Addended by: [name] on: 08/17/2018 10:00 PM   Modules accepted: Orders

## 2018-08-17 NOTE — Telephone Encounter (Signed)
I will email a prescription for a cream and Medrol Dosepak.  Use Benadryl as needed Office visit if problems.  Thank you

## 2018-08-17 NOTE — Telephone Encounter (Signed)
Please advise 

## 2018-08-18 NOTE — Telephone Encounter (Signed)
Pt.notified

## 2018-08-20 IMAGING — MG NEEDLE LOCALIZATION OF THE LEFT BREAST WITH MAMMO GUIDANCE
4 series · 4 of 4 positions shown · non-contrast
Comparison: Previous exam(s).

CLINICAL DATA: Patient presents for radioactive seed localization
of a small left breast carcinoma.

EXAM:
MAMMOGRAPHIC GUIDED RADIOACTIVE SEED LOCALIZATION OF THE LEFT BREAST

[L ML (1 of 3)]
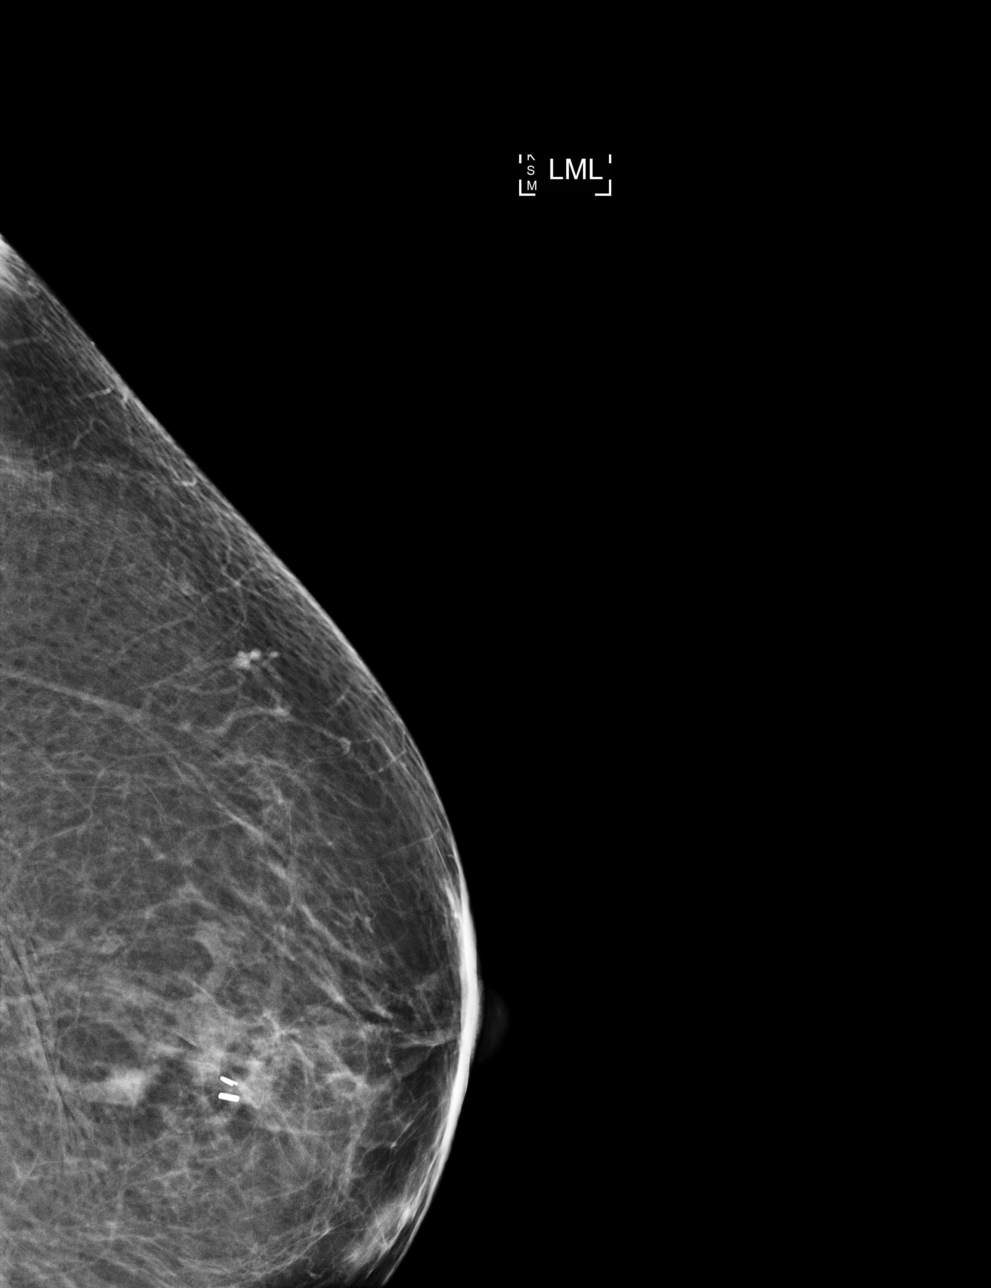

[L CC]
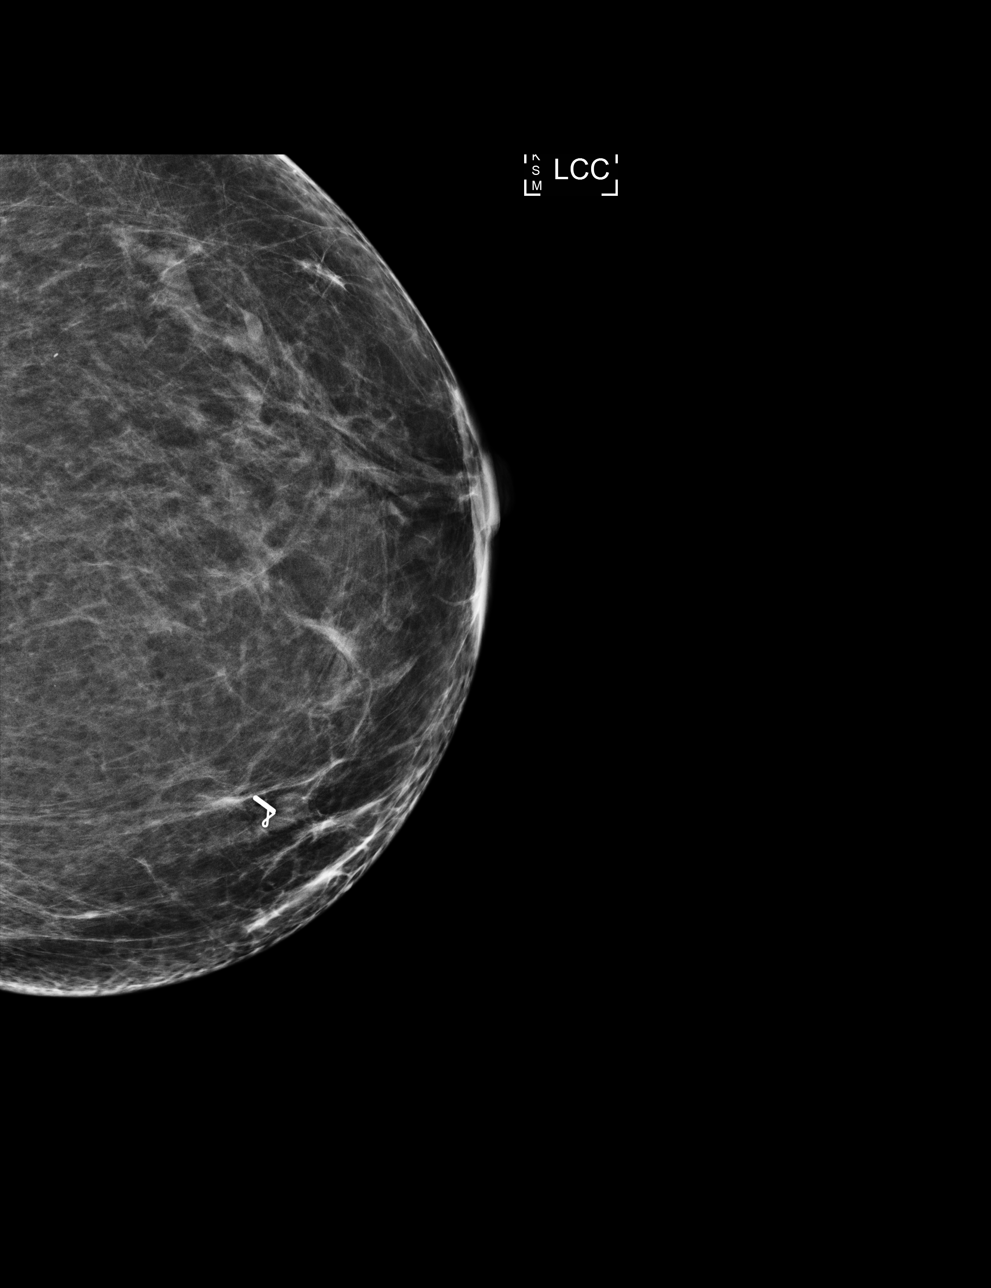

[L ML (2 of 3)]
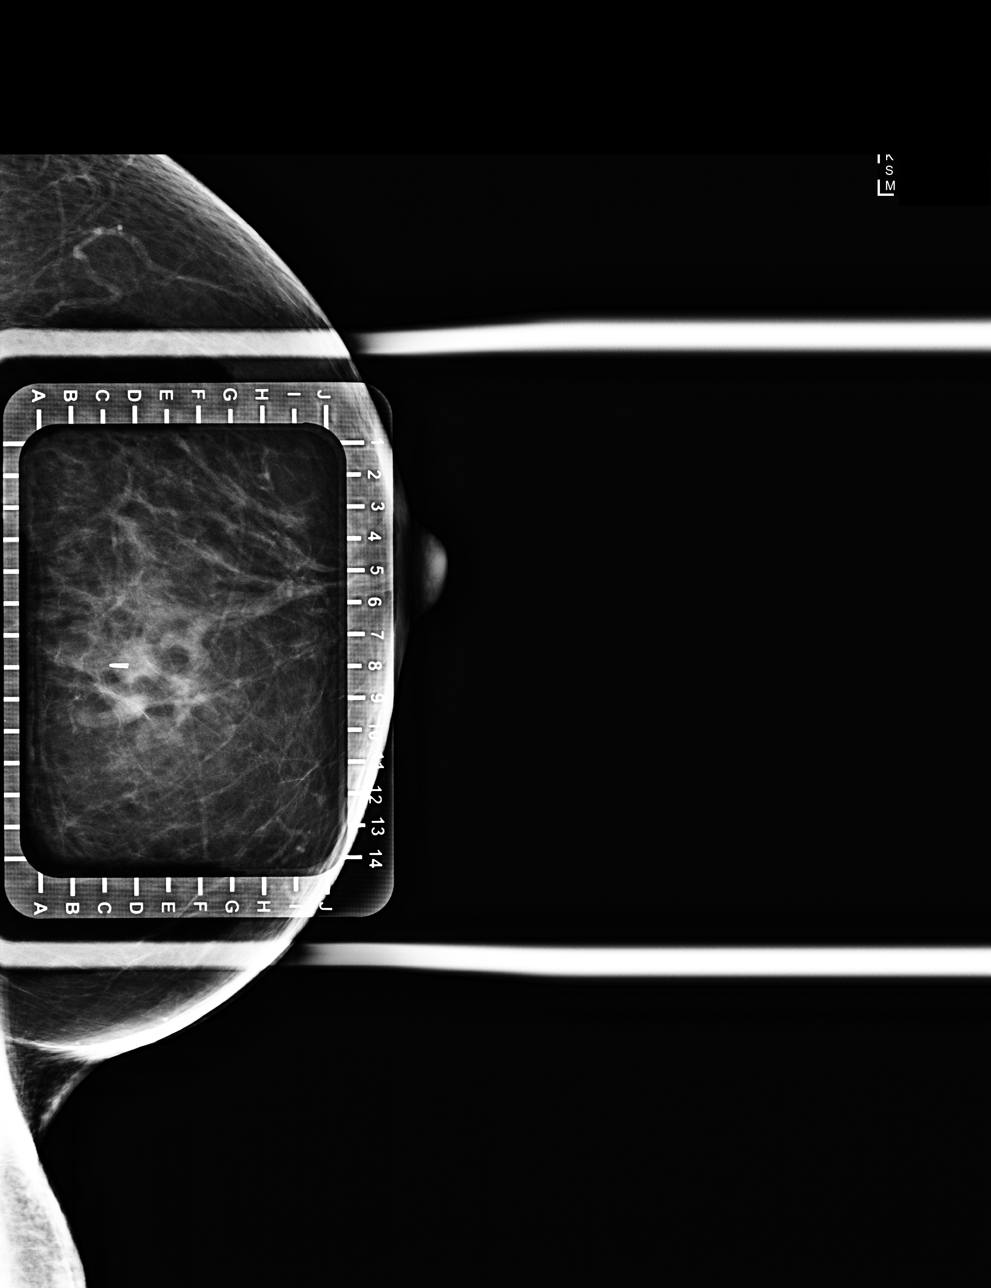

[L ML (3 of 3)]
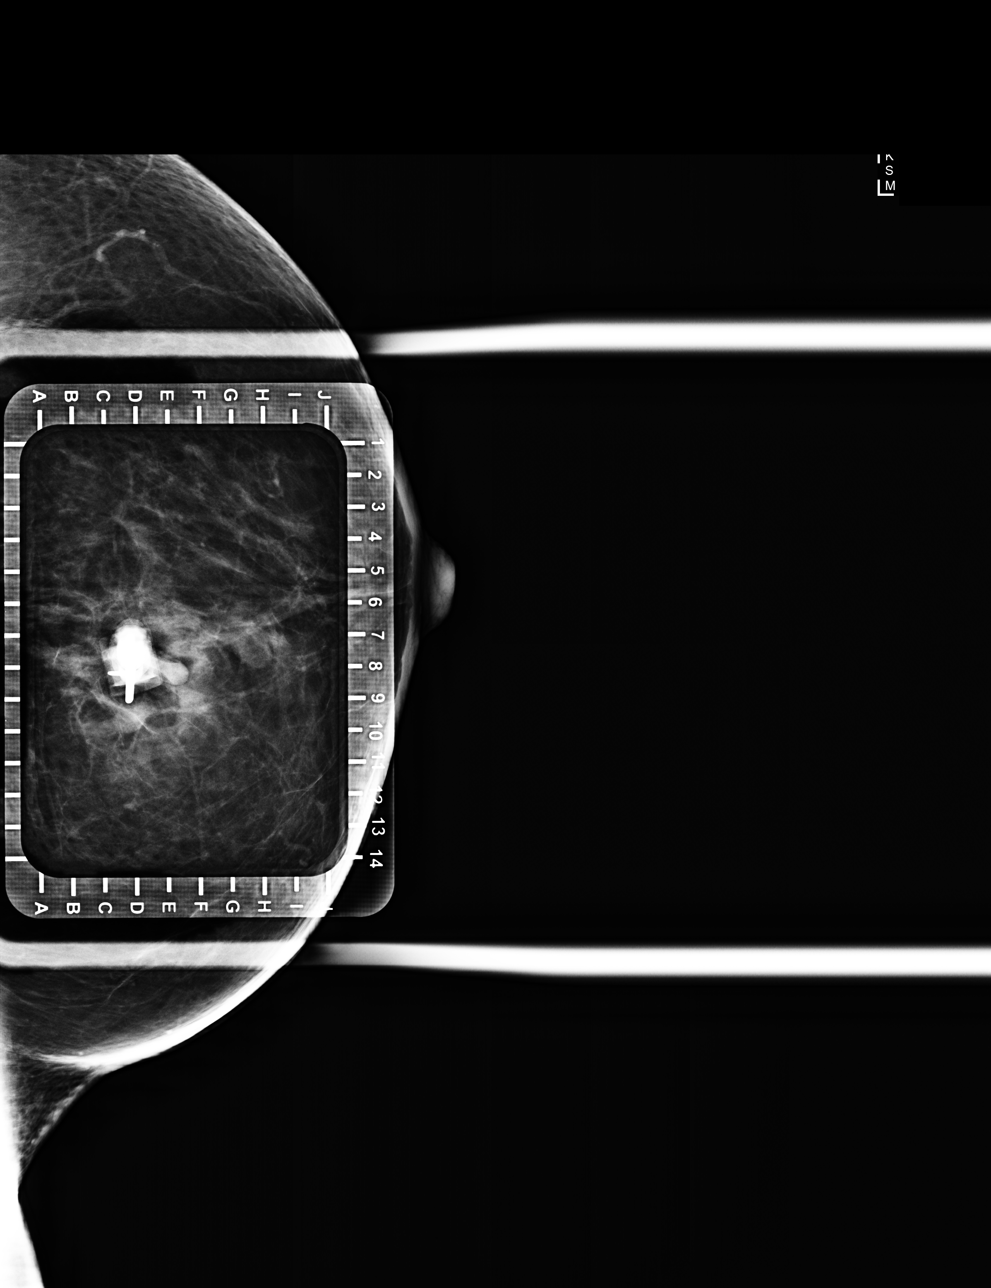

[4 of 4 positions shown; findings below may reference images not displayed]

FINDINGS: Patient presents for radioactive seed localization prior to surgical
excision. I met with the patient and we discussed the procedure of
seed localization including benefits and alternatives. We discussed
the high likelihood of a successful procedure. We discussed the
risks of the procedure including infection, bleeding, tissue injury
and further surgery. We discussed the low dose of radioactivity
involved in the procedure. Informed, written consent was given.

The usual time-out protocol was performed immediately prior to the
procedure.

Using mammographic guidance, sterile technique, 1% lidocaine and an
8-[id number] radioactive seed, the ribbon shaped biopsy clip in the medial
left breast was localized using a medial approach. The follow-up
mammogram images confirm the seed in the expected location and were
marked for Dr. Yen.

Follow-up survey of the patient confirms presence of the radioactive
seed.

Order number of 8-H41 seed:  [id number].

Total activity:  0.255 millicuries  Reference Date: 08/29/2016

The patient tolerated the procedure well and was released from the
[REDACTED]. She was given instructions regarding seed removal.
IMPRESSION: Radioactive seed localization of the left breast. No apparent
complications.

## 2018-08-21 IMAGING — CR DG CHEST 1V PORT
1 series · 1 of 1 positions shown · non-contrast
Comparison: 05/17/2008 ; 11/19/2007

CLINICAL DATA: Failed attempted Port a catheter placement. Evaluate
for pneumothorax.

EXAM:
PORTABLE CHEST 1 VIEW

[portable]
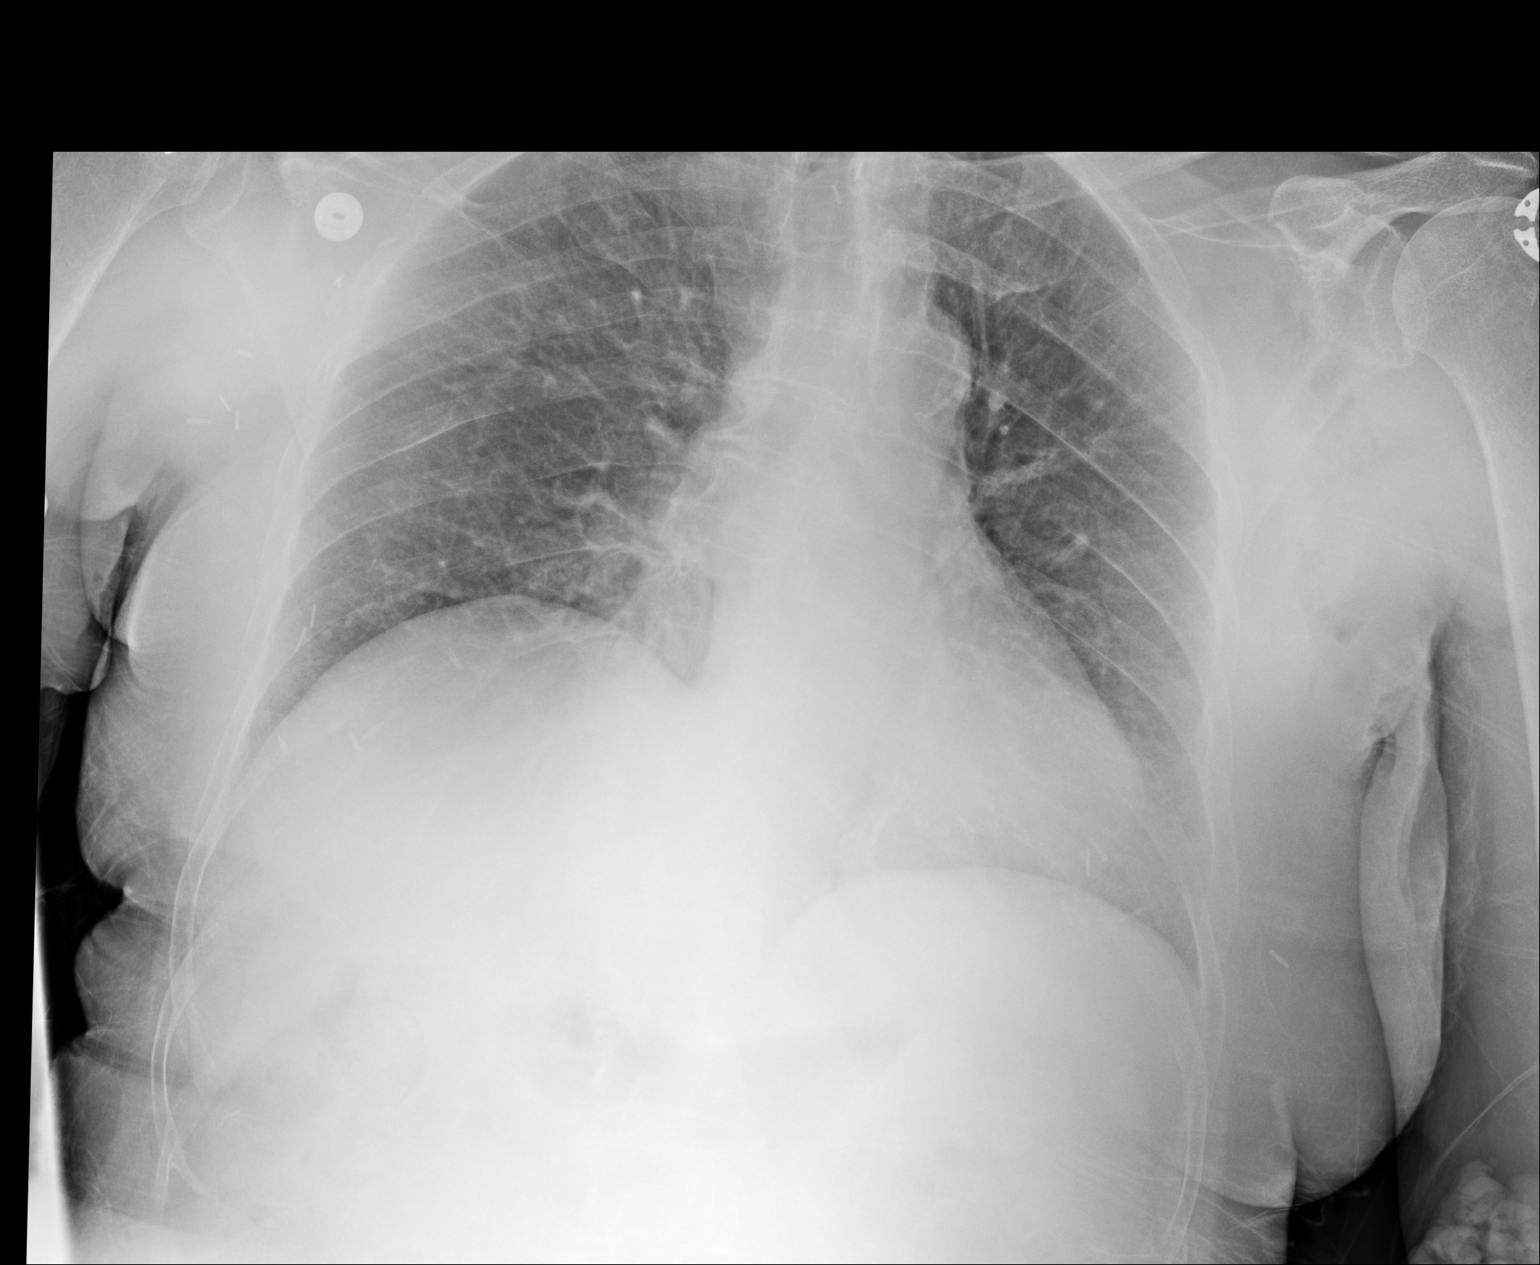

[1 of 1 positions shown; findings below may reference images not displayed]

FINDINGS: Normal cardiac silhouette and mediastinal contours with
atherosclerotic plaque within the thoracic aorta. There is mild
elevation of the right hemidiaphragm. One venous congestion without
frank evidence of edema. Minimal bilateral infrahilar opacities
favored to represent atelectasis. No pleural effusion or
pneumothorax.

Post right-sided mastectomy. Surgical clips overlie the right
axilla. Additional surgical clips overlie the left breast.

No radiopaque foreign body.
IMPRESSION: 1. No evidence of complication following attempted Port a catheter
placement. Specifically, no definite pneumothorax.
2.  Aortic Atherosclerosis ([id number]).

## 2018-09-05 ENCOUNTER — Other Ambulatory Visit: Payer: Self-pay | Admitting: Internal Medicine

## 2018-10-15 ENCOUNTER — Inpatient Hospital Stay: Payer: Medicare Other

## 2018-10-15 ENCOUNTER — Inpatient Hospital Stay: Payer: Medicare Other | Admitting: Adult Health

## 2018-10-23 ENCOUNTER — Inpatient Hospital Stay: Payer: Medicare Other

## 2018-10-23 ENCOUNTER — Telehealth: Payer: Self-pay | Admitting: Adult Health

## 2018-10-23 ENCOUNTER — Inpatient Hospital Stay: Payer: Medicare Other | Admitting: Adult Health

## 2018-10-23 NOTE — Telephone Encounter (Signed)
Returned patient's phone call regarding 09/11 appointment, per patient's request appointment has been moved to 09/18 due to patient not feeling well.

## 2018-10-27 ENCOUNTER — Other Ambulatory Visit: Payer: Self-pay | Admitting: Oncology

## 2018-10-27 ENCOUNTER — Other Ambulatory Visit: Payer: Self-pay

## 2018-10-27 ENCOUNTER — Ambulatory Visit
Admission: RE | Admit: 2018-10-27 | Discharge: 2018-10-27 | Disposition: A | Discharge status: Home / Self Care | Payer: Medicare Other | Source: Ambulatory Visit | Attending: Oncology | Admitting: Oncology

## 2018-10-27 DIAGNOSIS — M818 Other osteoporosis without current pathological fracture: Secondary | ICD-10-CM

## 2018-10-27 DIAGNOSIS — C50412 Malignant neoplasm of upper-outer quadrant of left female breast: Secondary | ICD-10-CM

## 2018-10-27 DIAGNOSIS — Z170 Estrogen receptor positive status [ER+]: Secondary | ICD-10-CM

## 2018-10-27 DIAGNOSIS — N6489 Other specified disorders of breast: Secondary | ICD-10-CM | POA: Diagnosis not present

## 2018-10-27 DIAGNOSIS — R928 Other abnormal and inconclusive findings on diagnostic imaging of breast: Secondary | ICD-10-CM | POA: Diagnosis not present

## 2018-10-29 ENCOUNTER — Telehealth: Payer: Self-pay | Admitting: Oncology

## 2018-10-29 NOTE — Telephone Encounter (Signed)
Returned patient's phone call regarding rescheduling 09/18 appointment, per patient's request appointment has been moved to 10/08.

## 2018-10-30 ENCOUNTER — Inpatient Hospital Stay: Payer: Medicare Other | Admitting: Adult Health

## 2018-10-30 ENCOUNTER — Inpatient Hospital Stay: Payer: Medicare Other

## 2018-11-02 ENCOUNTER — Other Ambulatory Visit: Payer: Medicare Other

## 2018-11-05 ENCOUNTER — Other Ambulatory Visit: Payer: Medicare Other

## 2018-11-12 ENCOUNTER — Other Ambulatory Visit: Payer: Self-pay | Admitting: Internal Medicine

## 2018-11-12 ENCOUNTER — Ambulatory Visit: Payer: Medicare Other | Admitting: Internal Medicine

## 2018-11-13 ENCOUNTER — Other Ambulatory Visit: Payer: Medicare Other

## 2018-11-17 ENCOUNTER — Ambulatory Visit (INDEPENDENT_AMBULATORY_CARE_PROVIDER_SITE_OTHER): Payer: Medicare Other | Admitting: Internal Medicine

## 2018-11-17 ENCOUNTER — Other Ambulatory Visit: Payer: Self-pay

## 2018-11-17 ENCOUNTER — Encounter: Payer: Self-pay | Admitting: Internal Medicine

## 2018-11-17 VITALS — BP 112/72 | HR 112 | Temp 98.0°F | Ht 63.0 in | Wt 149.0 lb

## 2018-11-17 DIAGNOSIS — L570 Actinic keratosis: Secondary | ICD-10-CM

## 2018-11-17 DIAGNOSIS — E538 Deficiency of other specified B group vitamins: Secondary | ICD-10-CM

## 2018-11-17 DIAGNOSIS — E559 Vitamin D deficiency, unspecified: Secondary | ICD-10-CM | POA: Diagnosis not present

## 2018-11-17 DIAGNOSIS — E785 Hyperlipidemia, unspecified: Secondary | ICD-10-CM

## 2018-11-17 DIAGNOSIS — I10 Essential (primary) hypertension: Secondary | ICD-10-CM | POA: Diagnosis not present

## 2018-11-17 DIAGNOSIS — I2510 Atherosclerotic heart disease of native coronary artery without angina pectoris: Secondary | ICD-10-CM | POA: Diagnosis not present

## 2018-11-17 NOTE — Assessment & Plan Note (Signed)
-   Crestor 

## 2018-11-17 NOTE — Assessment & Plan Note (Signed)
Chronic Risks associated with treatment noncompliance were discussed. Compliance was encouraged. Better BP at home Losartan 

## 2018-11-17 NOTE — Assessment & Plan Note (Addendum)
No angina Crestor, BP control and ASA

## 2018-11-17 NOTE — Assessment & Plan Note (Signed)
On b12 

## 2018-11-17 NOTE — Assessment & Plan Note (Signed)
Vit D 

## 2018-11-17 NOTE — Progress Notes (Addendum)
Subjective:  Patient ID: [name] [name], female    DOB: [birth year]  Age: 78 y.o. MRN: [id number]  CC: No chief complaint on file.   HPI [name] [name] presents for anxiety, depression, breast cancer f/u Sister w/lymphoma  Outpatient Medications Prior to Visit  Medication Sig Dispense Refill   ALPRAZolam (XANAX) 1 MG tablet TAKE 0.5-1 TABLETS (0.5-1 MG TOTAL) BY MOUTH 2 (TWO) TIMES DAILY AS NEEDED FOR ANXIETY. 60 tablet 3   anastrozole (ARIMIDEX) 1 MG tablet TAKE 1 TABLET BY MOUTH EVERY DAY 90 tablet 4   aspirin EC 81 MG tablet Take 1 tablet (81 mg total) by mouth daily. 100 tablet 3   cholecalciferol (VITAMIN D) 1000 units tablet Take 2 tablets (2,000 Units total) by mouth daily. 100 tablet 3   Cyanocobalamin (VITAMIN B-12 IJ) Inject 1,000 mcg as directed every 30 (thirty) days.     DULoxetine (CYMBALTA) 60 MG capsule TAKE 1 CAPSULE BY MOUTH EVERY DAY IN THE MORNING 90 capsule 1   gabapentin (NEURONTIN) 100 MG capsule Take 1 capsule (100 mg total) by mouth at bedtime. 60 capsule 6   levothyroxine (SYNTHROID, LEVOTHROID) 112 MCG tablet TAKE 1 TABLET (112 MCG TOTAL) BY MOUTH DAILY. 90 tablet 3   loratadine (CLARITIN) 10 MG tablet Take 1 tablet (10 mg total) by mouth daily. 30 tablet 11   meclizine (ANTIVERT) 12.5 MG tablet 1-2 tab by mouth every 8 hrs as needed 40 tablet 1   methylPREDNISolone (MEDROL DOSEPAK) 4 MG TBPK tablet As directed 21 tablet 0   predniSONE (DELTASONE) 10 MG tablet 3 tabs by mouth per day for 3 days,2tabs per day for 3 days,1tab per day for 3 days 18 tablet 0   rosuvastatin (CRESTOR) 20 MG tablet AS DIRECTED 90 tablet 1   telmisartan (MICARDIS) 40 MG tablet Take 0.5 tablets (20 mg total) by mouth daily. 90 tablet 3   triamcinolone (NASACORT) 55 MCG/ACT AERO nasal inhaler Place 2 sprays into the nose daily. 1 Inhaler 12   triamcinolone cream (KENALOG) 0.5 % Apply 1 application topically 4 (four) times daily. 90 g 1   venlafaxine XR (EFFEXOR-XR) 75  MG 24 hr capsule Take 1 capsule (75 mg total) daily with breakfast by mouth. 30 capsule 6   vitamin B-12 (CYANOCOBALAMIN) 1000 MCG tablet Take 1,000 mcg by mouth every other day.     No facility-administered medications prior to visit.     ROS: Review of Systems  Constitutional: Negative for activity change, appetite change, chills, fatigue and unexpected weight change.  HENT: Negative for congestion, mouth sores and sinus pressure.   Eyes: Negative for visual disturbance.  Respiratory: Negative for cough and chest tightness.   Gastrointestinal: Negative for abdominal pain and nausea.  Genitourinary: Negative for difficulty urinating, frequency and vaginal pain.  Musculoskeletal: Positive for arthralgias. Negative for back pain and gait problem.  Skin: Negative for pallor and rash.  Neurological: Negative for dizziness, tremors, weakness, numbness and headaches.  Psychiatric/Behavioral: Negative for confusion, sleep disturbance and suicidal ideas. The patient is nervous/anxious.     Objective:  BP 112/72 (BP Location: Left Arm, Patient Position: Sitting, Cuff Size: Normal)    Pulse (!) 112    Temp 98 F (36.7 C) (Oral)    Ht 5\' 3"  (1.6 m)    Wt 149 lb (67.6 kg)    SpO2 96%    BMI 26.39 kg/m   BP Readings from Last 3 Encounters:  11/17/18 112/72  08/12/18 108/64  04/16/18 (!) 95/58  Wt Readings from Last 3 Encounters:  11/17/18 149 lb (67.6 kg)  08/12/18 149 lb (67.6 kg)  04/16/18 149 lb (67.6 kg)    Physical Exam Constitutional:      General: She is not in acute distress.    Appearance: She is well-developed.  HENT:     Head: Normocephalic.     Right Ear: External ear normal.     Left Ear: External ear normal.     Nose: Nose normal.  Eyes:     General:        Right eye: No discharge.        Left eye: No discharge.     Conjunctiva/sclera: Conjunctivae normal.     Pupils: Pupils are equal, round, and reactive to light.  Neck:     Musculoskeletal: Normal range of  motion and neck supple.     Thyroid: No thyromegaly.     Vascular: No JVD.     Trachea: No tracheal deviation.  Cardiovascular:     Rate and Rhythm: Normal rate and regular rhythm.     Heart sounds: Normal heart sounds.  Pulmonary:     Effort: No respiratory distress.     Breath sounds: No stridor. No wheezing.  Abdominal:     General: Bowel sounds are normal. There is no distension.     Palpations: Abdomen is soft. There is no mass.     Tenderness: There is no abdominal tenderness. There is no guarding or rebound.  Musculoskeletal:        General: No tenderness.  Lymphadenopathy:     Cervical: No cervical adenopathy.  Skin:    Findings: No erythema or rash.  Neurological:     Mental Status: She is oriented to person, place, and time.     Cranial Nerves: No cranial nerve deficit.     Motor: No abnormal muscle tone.     Coordination: Coordination normal.     Deep Tendon Reflexes: Reflexes normal.  Psychiatric:        Behavior: Behavior normal.        Thought Content: Thought content normal.        Judgment: Judgment normal.     Procedure Note :     Procedure : Cryosurgery   Indication:  Actinic keratosis(es)   Risks including unsuccessful procedure , bleeding, infection, bruising, scar, a need for a repeat  procedure and others were explained to the patient in detail as well as the benefits. Informed consent was obtained verbally.   3 lesion(s)  on face and neck   was/were treated with liquid nitrogen on a Q-tip in a usual fasion . Band-Aid was applied and antibiotic ointment was given for a later use.   Tolerated well. Complications none.     Lab Results  Component Value Date   WBC 6.2 08/12/2018   HGB 13.7 08/12/2018   HCT 41.4 08/12/2018   PLT 350.0 08/12/2018   GLUCOSE 77 08/12/2018   CHOL 205 (H) 06/05/2015   TRIG 257.0 (H) 06/05/2015   HDL 44.60 06/05/2015   LDLDIRECT 125.0 06/05/2015   LDLCALC 122 (H) 12/04/2007   ALT 21 08/12/2018   AST 27 08/12/2018    NA 142 08/12/2018   K 4.3 08/12/2018   CL 106 08/12/2018   CREATININE 1.38 (H) 08/12/2018   BUN 19 08/12/2018   CO2 26 08/12/2018   TSH 0.65 08/12/2018   INR 0.98 12/30/2016   HGBA1C 5.4 12/05/2010    US Breast Ltd Uni Left Inc Axilla  Result Date: 10/27/2018 CLINICAL DATA:  Here for evaluation of a palpable and painful area of concern in the left breast at the lumpectomy site which has not changed since prior exam. Patient underwent left breast lumpectomy in August 2018. Patient also has a history of a right mastectomy. EXAM: DIGITAL DIAGNOSTIC LEFT MAMMOGRAM WITH CAD AND TOMO ULTRASOUND LEFT BREAST COMPARISON:  Previous exam(s). ACR Breast Density Category b: There are scattered areas of fibroglandular density. FINDINGS: An oval circumscribed mass at the lumpectomy site is not significantly changed since prior exam. This measures 3.2 cm in greatest dimension. No suspicious mass, microcalcification, or other finding is identified. Mammogram images are processed with CAD. Targeted left breast ultrasound was performed by the physician in the palpable and painful area of concern. At 8 o'clock 4 cm from the nipple an anechoic mass measuring 2.2 x 1.3 x 2.5 cm is seen at the lumpectomy site. This corresponds to the circumscribed mass seen on the mammogram and is consistent with a benign seroma. IMPRESSION: Benign seroma at the lumpectomy site in the left breast corresponding to the painful and palpable area of concern. No evidence of malignancy. RECOMMENDATION: Recommend routine annual diagnostic mammogram in 1 year. Ultrasound-guided seroma drainage could be performed for symptomatic relief if the patient so desires. I have discussed the findings and recommendations with the patient. If applicable, a reminder letter will be sent to the patient regarding the next appointment. BI-RADS CATEGORY  2: Benign. Electronically Signed   By: Zerita Boers M.D.   On: 10/27/2018 16:23   Mm Diag Breast Tomo Uni  Left  Result Date: 10/27/2018 CLINICAL DATA:  Here for evaluation of a palpable and painful area of concern in the left breast at the lumpectomy site which has not changed since prior exam. Patient underwent left breast lumpectomy in August 2018. Patient also has a history of a right mastectomy. EXAM: DIGITAL DIAGNOSTIC LEFT MAMMOGRAM WITH CAD AND TOMO ULTRASOUND LEFT BREAST COMPARISON:  Previous exam(s). ACR Breast Density Category b: There are scattered areas of fibroglandular density. FINDINGS: An oval circumscribed mass at the lumpectomy site is not significantly changed since prior exam. This measures 3.2 cm in greatest dimension. No suspicious mass, microcalcification, or other finding is identified. Mammogram images are processed with CAD. Targeted left breast ultrasound was performed by the physician in the palpable and painful area of concern. At 8 o'clock 4 cm from the nipple an anechoic mass measuring 2.2 x 1.3 x 2.5 cm is seen at the lumpectomy site. This corresponds to the circumscribed mass seen on the mammogram and is consistent with a benign seroma. IMPRESSION: Benign seroma at the lumpectomy site in the left breast corresponding to the painful and palpable area of concern. No evidence of malignancy. RECOMMENDATION: Recommend routine annual diagnostic mammogram in 1 year. Ultrasound-guided seroma drainage could be performed for symptomatic relief if the patient so desires. I have discussed the findings and recommendations with the patient. If applicable, a reminder letter will be sent to the patient regarding the next appointment. BI-RADS CATEGORY  2: Benign. Electronically Signed   By: Zerita Boers M.D.   On: 10/27/2018 16:23    Assessment & Plan:   There are no diagnoses linked to this encounter.   No orders of the defined types were placed in this encounter.    Follow-up: No follow-ups on file.  Walker Kehr, MD

## 2018-11-19 ENCOUNTER — Encounter: Payer: Self-pay | Admitting: Adult Health

## 2018-11-19 ENCOUNTER — Inpatient Hospital Stay: Payer: Medicare Other

## 2018-11-19 ENCOUNTER — Inpatient Hospital Stay: Payer: Medicare Other | Attending: Oncology

## 2018-11-19 ENCOUNTER — Inpatient Hospital Stay (HOSPITAL_BASED_OUTPATIENT_CLINIC_OR_DEPARTMENT_OTHER): Payer: Medicare Other | Admitting: Adult Health

## 2018-11-19 ENCOUNTER — Other Ambulatory Visit: Payer: Self-pay

## 2018-11-19 VITALS — BP 109/80 | HR 111 | Temp 98.2°F | Resp 18 | Ht 63.0 in | Wt 148.6 lb

## 2018-11-19 DIAGNOSIS — Z7982 Long term (current) use of aspirin: Secondary | ICD-10-CM | POA: Diagnosis not present

## 2018-11-19 DIAGNOSIS — Z171 Estrogen receptor negative status [ER-]: Secondary | ICD-10-CM | POA: Insufficient documentation

## 2018-11-19 DIAGNOSIS — Z170 Estrogen receptor positive status [ER+]: Secondary | ICD-10-CM

## 2018-11-19 DIAGNOSIS — I2510 Atherosclerotic heart disease of native coronary artery without angina pectoris: Secondary | ICD-10-CM

## 2018-11-19 DIAGNOSIS — M810 Age-related osteoporosis without current pathological fracture: Secondary | ICD-10-CM | POA: Diagnosis not present

## 2018-11-19 DIAGNOSIS — Z85850 Personal history of malignant neoplasm of thyroid: Secondary | ICD-10-CM | POA: Diagnosis not present

## 2018-11-19 DIAGNOSIS — Z79899 Other long term (current) drug therapy: Secondary | ICD-10-CM | POA: Insufficient documentation

## 2018-11-19 DIAGNOSIS — Z9221 Personal history of antineoplastic chemotherapy: Secondary | ICD-10-CM | POA: Insufficient documentation

## 2018-11-19 DIAGNOSIS — Z8249 Family history of ischemic heart disease and other diseases of the circulatory system: Secondary | ICD-10-CM | POA: Insufficient documentation

## 2018-11-19 DIAGNOSIS — C50412 Malignant neoplasm of upper-outer quadrant of left female breast: Secondary | ICD-10-CM

## 2018-11-19 DIAGNOSIS — Z853 Personal history of malignant neoplasm of breast: Secondary | ICD-10-CM | POA: Insufficient documentation

## 2018-11-19 DIAGNOSIS — Z79811 Long term (current) use of aromatase inhibitors: Secondary | ICD-10-CM | POA: Diagnosis not present

## 2018-11-19 DIAGNOSIS — Z923 Personal history of irradiation: Secondary | ICD-10-CM | POA: Insufficient documentation

## 2018-11-19 DIAGNOSIS — E890 Postprocedural hypothyroidism: Secondary | ICD-10-CM | POA: Diagnosis not present

## 2018-11-19 DIAGNOSIS — Z9011 Acquired absence of right breast and nipple: Secondary | ICD-10-CM | POA: Insufficient documentation

## 2018-11-19 DIAGNOSIS — I10 Essential (primary) hypertension: Secondary | ICD-10-CM | POA: Insufficient documentation

## 2018-11-19 DIAGNOSIS — Z95828 Presence of other vascular implants and grafts: Secondary | ICD-10-CM

## 2018-11-19 DIAGNOSIS — C50012 Malignant neoplasm of nipple and areola, left female breast: Secondary | ICD-10-CM

## 2018-11-19 LAB — COMPREHENSIVE METABOLIC PANEL
ALT: 13 U/L (ref 0–44)
AST: 20 U/L (ref 15–41)
Albumin: 4 g/dL (ref 3.5–5.0)
Alkaline Phosphatase: 92 U/L (ref 38–126)
Anion gap: 11 (ref 5–15)
BUN: 13 mg/dL (ref 8–23)
CO2: 24 mmol/L (ref 22–32)
Calcium: 9.6 mg/dL (ref 8.9–10.3)
Chloride: 103 mmol/L (ref 98–111)
Creatinine, Ser: 1.28 mg/dL — ABNORMAL HIGH (ref 0.44–1.00)
GFR calc Af Amer: 46 mL/min — ABNORMAL LOW (ref >60)
GFR calc non Af Amer: 40 mL/min — ABNORMAL LOW (ref >60)
Glucose, Bld: 186 mg/dL — ABNORMAL HIGH (ref 70–99)
Potassium: 3.6 mmol/L (ref 3.5–5.1)
Sodium: 138 mmol/L (ref 135–145)
Total Bilirubin: 0.8 mg/dL (ref 0.3–1.2)
Total Protein: 7.4 g/dL (ref 6.5–8.1)

## 2018-11-19 LAB — CBC WITH DIFFERENTIAL/PLATELET
Abs Immature Granulocytes: 0.01 10*3/uL (ref 0.00–0.07)
Basophils Absolute: 0 10*3/uL (ref 0.0–0.1)
Basophils Relative: 0 %
Eosinophils Absolute: 0.2 10*3/uL (ref 0.0–0.5)
Eosinophils Relative: 3 %
HCT: 39.9 % (ref 36.0–46.0)
Hemoglobin: 13.1 g/dL (ref 12.0–15.0)
Immature Granulocytes: 0 %
Lymphocytes Relative: 32 %
Lymphs Abs: 1.9 10*3/uL (ref 0.7–4.0)
MCH: 31.3 pg (ref 26.0–34.0)
MCHC: 32.8 g/dL (ref 30.0–36.0)
MCV: 95.5 fL (ref 80.0–100.0)
Monocytes Absolute: 0.4 10*3/uL (ref 0.1–1.0)
Monocytes Relative: 6 %
Neutro Abs: 3.5 10*3/uL (ref 1.7–7.7)
Neutrophils Relative %: 59 %
Platelets: 283 10*3/uL (ref 150–400)
RBC: 4.18 MIL/uL (ref 3.87–5.11)
RDW: 13.1 % (ref 11.5–15.5)
WBC: 5.9 10*3/uL (ref 4.0–10.5)
nRBC: 0 % (ref 0.0–0.2)

## 2018-11-19 MED ORDER — DENOSUMAB 60 MG/ML ~~LOC~~ SOSY
60.0000 mg | PREFILLED_SYRINGE | Freq: Once | SUBCUTANEOUS | Status: AC
  Administered 2018-11-19: 15:00:00 60 mg via SUBCUTANEOUS

## 2018-11-19 MED ORDER — DENOSUMAB 60 MG/ML ~~LOC~~ SOSY
PREFILLED_SYRINGE | SUBCUTANEOUS | Status: AC
  Filled 2018-11-19: qty 1

## 2018-11-19 NOTE — Patient Instructions (Signed)
Denosumab injection What is this medicine? DENOSUMAB (den oh sue mab) slows bone breakdown. Prolia is used to treat osteoporosis in women after menopause and in men, and in people who are taking corticosteroids for 6 months or more. Xgeva is used to treat a high calcium level due to cancer and to prevent bone fractures and other bone problems caused by multiple myeloma or cancer bone metastases. Xgeva is also used to treat giant cell tumor of the bone. This medicine may be used for other purposes; ask your health care provider or pharmacist if you have questions. COMMON BRAND NAME(S): Prolia, XGEVA What should I tell my health care provider before I take this medicine? They need to know if you have any of these conditions:  dental disease  having surgery or tooth extraction  infection  kidney disease  low levels of calcium or Vitamin D in the blood  malnutrition  on hemodialysis  skin conditions or sensitivity  thyroid or parathyroid disease  an unusual reaction to denosumab, other medicines, foods, dyes, or preservatives  pregnant or trying to get pregnant  breast-feeding How should I use this medicine? This medicine is for injection under the skin. It is given by a health care professional in a hospital or clinic setting. A special MedGuide will be given to you before each treatment. Be sure to read this information carefully each time. For Prolia, talk to your pediatrician regarding the use of this medicine in children. Special care may be needed. For Xgeva, talk to your pediatrician regarding the use of this medicine in children. While this drug may be prescribed for children as young as 13 years for selected conditions, precautions do apply. Overdosage: If you think you have taken too much of this medicine contact a poison control center or emergency room at once. NOTE: This medicine is only for you. Do not share this medicine with others. What if I miss a dose? It is  important not to miss your dose. Call your doctor or health care professional if you are unable to keep an appointment. What may interact with this medicine? Do not take this medicine with any of the following medications:  other medicines containing denosumab This medicine may also interact with the following medications:  medicines that lower your chance of fighting infection  steroid medicines like prednisone or cortisone This list may not describe all possible interactions. Give your health care provider a list of all the medicines, herbs, non-prescription drugs, or dietary supplements you use. Also tell them if you smoke, drink alcohol, or use illegal drugs. Some items may interact with your medicine. What should I watch for while using this medicine? Visit your doctor or health care professional for regular checks on your progress. Your doctor or health care professional may order blood tests and other tests to see how you are doing. Call your doctor or health care professional for advice if you get a fever, chills or sore throat, or other symptoms of a cold or flu. Do not treat yourself. This drug may decrease your body's ability to fight infection. Try to avoid being around people who are sick. You should make sure you get enough calcium and vitamin D while you are taking this medicine, unless your doctor tells you not to. Discuss the foods you eat and the vitamins you take with your health care professional. See your dentist regularly. Brush and floss your teeth as directed. Before you have any dental work done, tell your dentist you are   receiving this medicine. Do not become pregnant while taking this medicine or for 5 months after stopping it. Talk with your doctor or health care professional about your birth control options while taking this medicine. Women should inform their doctor if they wish to become pregnant or think they might be pregnant. There is a potential for serious side  effects to an unborn child. Talk to your health care professional or pharmacist for more information. What side effects may I notice from receiving this medicine? Side effects that you should report to your doctor or health care professional as soon as possible:  allergic reactions like skin rash, itching or hives, swelling of the face, lips, or tongue  bone pain  breathing problems  dizziness  jaw pain, especially after dental work  redness, blistering, peeling of the skin  signs and symptoms of infection like fever or chills; cough; sore throat; pain or trouble passing urine  signs of low calcium like fast heartbeat, muscle cramps or muscle pain; pain, tingling, numbness in the hands or feet; seizures  unusual bleeding or bruising  unusually weak or tired Side effects that usually do not require medical attention (report to your doctor or health care professional if they continue or are bothersome):  constipation  diarrhea  headache  joint pain  loss of appetite  muscle pain  runny nose  tiredness  upset stomach This list may not describe all possible side effects. Call your doctor for medical advice about side effects. You may report side effects to FDA at 1-800-FDA-1088. Where should I keep my medicine? This medicine is only given in a clinic, doctor's office, or other health care setting and will not be stored at home. NOTE: This sheet is a summary. It may not cover all possible information. If you have questions about this medicine, talk to your doctor, pharmacist, or health care provider.  2020 [employer] Standard (2017-06-06 16:10:44)

## 2018-11-19 NOTE — Progress Notes (Signed)
. [name]   DOB: 1940/11/05  MR#: [id number]  CSN#:[id number]  PCP: [name], MD Patient Care Team: [name], MD as PCP - General Magrinat, [name], MD as Consulting Physician (Oncology) [name], MD as Consulting Physician (General Surgery) [name], MD as Consulting Physician (Gastroenterology) [name], MD as Consulting Physician (Radiation Oncology)   CHIEF COMPLAINT: Weakly estrogen receptor positive breast cancer  CURRENT TREATMENT: Anastrozole, Prolia  INTERVAL HISTORY: Donovan is contacted today for follow-up and treatment of her weakly estrogen receptor positive breast cancer.  She continues on anastrozole. She tolerates this well and without any noticeable side effects.    Sophiamarie's last bone density screening on 05/06/2017, showed a T-score of -3.4, which is considered osteoporotic.    REVIEW OF SYSTEMS: Nawal is doing well today.  She denies any new issues today.  She has continued to see her PCP regularly and notes she is up to date with skin cancer and colon cancer screening.  She exercises regularly.  She denies any concerns such as fever, chills, chest pain, palpitations, cough, shortness of breath, bowel/bladder changes, headaches, vision issues, or any other concerns.  A detailed ROS was otherwise non contributory.     BREAST CANCER HISTORY: From the recent summary note:  I last saw Nakima in 2013 when she was released from follow-up from her right breast cancer recurrence in 2004. She is status post right mastectomy and adjuvant chemotherapy for that triple negative tumor. More recently, on 07/16/2016 she underwent left screening mammography at the [facility] on 07/16/2016. This found the breast density to be category B. There was a possible asymmetry in the left breast, and on 07/19/2016 she underwent left diagnostic mammography with tomography and left breast ultrasonography. This confirmed a well circumscribed oval  mass at the 9:00 position of the left breast measuring approximately 0.8 cm. This was not palpable. On ultrasonography the mass measured 0.6 cm and it was located at the 9:00 radiant 4 cm from the nipple. The left axilla was sonographically benign.  On June 11 Yazlin underwent biopsy of the left breast mass in question and this showed ([id number]) and invasive ductal carcinoma, grade 3, estrogen receptor 5% positive with weak staining intensity, progesterone receptor negative, with an MIB-1 of 80%, and HER-2 nonamplified, the signals ratio being 1.46 and the number per cell 1.90.  Her subsequent history is as detailed below.   PAST MEDICAL HISTORY: Past Medical History:  Diagnosis Date  . Allergic rhinitis   . Anxiety   . Breast cancer ([facility]) hx 2004   recurrent 2006 DR Magrinat  . Family history of breast cancer   . 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 HTN (hypertension)   . Hyperlipidemia   . Hypothyroidism   . Personal history of chemotherapy 2002  . Personal history of radiation therapy 2002  . Psoriasis   . S/P thyroidectomy 07/2005   2 cm largest diameter (1 other tiny focus)/ i-131 rx 99 mci 08/2005  . Thyroid cancer ([facility])    Papillary Stage 1 - Dr Loanne Drilling  . Vitamin B12 deficiency 2009  . Vitamin D deficiency 2009    PAST SURGICAL HISTORY: Past Surgical History:  Procedure Laterality Date  . BREAST LUMPECTOMY    . BREAST LUMPECTOMY WITH RADIOACTIVE SEED AND SENTINEL LYMPH NODE BIOPSY Left 09/11/2016   Procedure: LEFT BREAST LUMPECTOMY WITH RADIOACTIVE SEED AND LEFT AXILLARY SENTINEL LYMPH  NODE BIOPSY WITH BLUE DYE INJECTION;  Surgeon: [name], MD;  Location: [facility];  Service: General;  Laterality: Left;  . IR FLUORO GUIDE PORT INSERTION RIGHT  09/13/2016  . IR REMOVAL TUN ACCESS W/ PORT W/O FL MOD SED  12/30/2016  . IR US GUIDE VASC ACCESS RIGHT  09/13/2016  . MASTECTOMY      Right  . PORTACATH PLACEMENT N/A 09/11/2016   Procedure: ATTEMPTED INSERTION PORT-A-CATH WITH ULTRA SOUND GUIDANCE;  Surgeon: [name], MD;  Location: [facility];  Service: General;  Laterality: N/A;  . REDUCTION MAMMAPLASTY Left 2005  . THYROIDECTOMY  2007    FAMILY HISTORY Family History  Problem Relation Age of Onset  . Stroke Mother   . Allergies Mother   . Asthma Mother   . Clotting disorder Mother   . Heart disease Father 8       MI  . Allergies Sister   . Asthma Sister   . Breast cancer Sister 11       Lymphoma 32; currently 35  . Lymphoma Sister   . Asthma Brother   . Leukemia Brother        dx 62s  . Allergies Daughter   . Asthma Daughter   . Allergies Sister   . Breast cancer Sister 79       currently 30  . Kidney cancer Paternal Aunt        kidney ca; deceased 39  . Liver cancer Paternal Uncle        unk. primary ("liver")  . Throat cancer Maternal Grandmother        deceased 37  . Lung cancer Maternal Grandfather        deceased 53  . Pancreatic cancer Paternal Grandfather        deceased 31  . Cancer Paternal Aunt        "abdominal"; deceased 35  Family history includes a sister, a paternal aunt, and a paternal cousin with breast cancer all older then 37 at diagnosis  GYN HISTORY: Menarche age 44, first live birth age 89. She is GX P3. She went through the change of life age 36. She did not take hormone replacement.  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 Her husband [name] is retired from [employer]. Her children are [name], who works for time [employer] in [place], and Alger, who lives in [place] and works for [employer]. The third child, [name], died in an automobile accident at age 56. The patient has one granddaughter. Her dog Baxter Hire, just passed away at 17 years old. 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 Smoking status: Never Smoker  . Smokeless tobacco: Never Used  Substance Use Topics  . Alcohol use: No  . Drug use: No     Colonoscopy:  PAP:  Bone density: 05/06/2017 showed a T score of -3.4 osteoporosis  Lipid panel:  Allergies  Allergen Reactions  . Pneumococcal Vaccines Other (See Comments)    Was really sick   . Sulfa Antibiotics Other (See Comments)    Pt believes it was hives  . Sulfacetamide Sodium-Sulfur   . Tape Itching, Dermatitis and Rash  . Tegaderm Ag Mesh [Silver] Itching and Rash    Current Outpatient Medications  Medication Sig Dispense Refill  . ALPRAZolam (XANAX) 1 MG tablet TAKE 0.5-1 TABLETS (0.5-1 MG TOTAL)  BY MOUTH 2 (TWO) TIMES DAILY AS NEEDED FOR ANXIETY. 60 tablet 3  . anastrozole (ARIMIDEX) 1 MG tablet TAKE 1 TABLET BY MOUTH EVERY DAY 90 tablet 4  . aspirin EC 81 MG tablet Take 1 tablet (81 mg total) by mouth daily. 100 tablet 3  . cholecalciferol (VITAMIN D) 1000 units tablet Take 2 tablets (2,000 Units total) by mouth daily. 100 tablet 3  . Cyanocobalamin (VITAMIN B-12 IJ) Inject 1,000 mcg as directed every 30 (thirty) days.    . DULoxetine (CYMBALTA) 60 MG capsule TAKE 1 CAPSULE BY MOUTH EVERY DAY IN THE MORNING 90 capsule 1  . levothyroxine (SYNTHROID, LEVOTHROID) 112 MCG tablet TAKE 1 TABLET (112 MCG TOTAL) BY MOUTH DAILY. 90 tablet 3  . loratadine (CLARITIN) 10 MG tablet Take 1 tablet (10 mg total) by mouth daily. 30 tablet 11  . meclizine (ANTIVERT) 12.5 MG tablet 1-2 tab by mouth every 8 hrs as needed 40 tablet 1  . rosuvastatin (CRESTOR) 20 MG tablet AS DIRECTED 90 tablet 1  . telmisartan (MICARDIS) 40 MG tablet Take 0.5 tablets (20 mg total) by mouth daily. 90 tablet 3  . triamcinolone (NASACORT) 55 MCG/ACT AERO nasal inhaler Place 2 sprays into the nose daily. 1 Inhaler 12  . triamcinolone cream (KENALOG) 0.5 % Apply 1 application topically 4 (four) times daily. 90 g 1  . venlafaxine XR (EFFEXOR-XR) 75 MG 24 hr capsule Take 1 capsule  (75 mg total) daily with breakfast by mouth. 30 capsule 6  . vitamin B-12 (CYANOCOBALAMIN) 1000 MCG tablet Take 1,000 mcg by mouth every other day.     No current facility-administered medications for this visit.     OBJECTIVE:   Vitals:   11/19/18 1332  BP: 109/80  Pulse: (!) 111  Resp: 18  Temp: 98.2 F (36.8 C)     Body mass index is 26.32 kg/m.    ECOG FS: 1 GENERAL: Patient is a well appearing female in no acute distress HEENT:  Sclerae anicteric.  Oropharynx clear and moist. No ulcerations or evidence of oropharyngeal candidiasis. Neck is supple.  NODES:  No cervical, supraclavicular, or axillary lymphadenopathy palpated.  BREAST EXAM:  Deferred. LUNGS:  Clear to auscultation bilaterally.  No wheezes or rhonchi. HEART:  Regular rate and rhythm. No murmur appreciated. ABDOMEN:  Soft, nontender.  Positive, normoactive bowel sounds. No organomegaly palpated. MSK:  No focal spinal tenderness to palpation. Full range of motion bilaterally in the upper extremities. EXTREMITIES:  No peripheral edema.   SKIN:  Clear with no obvious rashes or skin changes. No nail dyscrasia. NEURO:  Nonfocal. Well oriented.  Appropriate affect.     LAB RESULTS: Lab Results  Component Value Date   WBC 5.9 11/19/2018   NEUTROABS 3.5 11/19/2018   HGB 13.1 11/19/2018   HCT 39.9 11/19/2018   MCV 95.5 11/19/2018   PLT 283 11/19/2018      Chemistry      Component Value Date/Time   NA 138 11/19/2018 1246   NA 139 12/11/2016 1017   K 3.6 11/19/2018 1246   K 3.7 12/11/2016 1017   CL 103 11/19/2018 1246   CL 104 10/09/2011 1403   CO2 24 11/19/2018 1246   CO2 22 12/11/2016 1017   BUN 13 11/19/2018 1246   BUN 16.9 12/11/2016 1017   CREATININE 1.28 (H) 11/19/2018 1246   CREATININE 1.2 (H) 12/11/2016 1017      Component Value Date/Time   CALCIUM 9.6 11/19/2018 1246   CALCIUM 9.0 12/11/2016 1017  ALKPHOS 92 11/19/2018 1246   ALKPHOS 139 12/11/2016 1017   AST 20 11/19/2018 1246   AST 65  (H) 12/11/2016 1017   ALT 13 11/19/2018 1246   ALT 51 12/11/2016 1017   BILITOT 0.8 11/19/2018 1246   BILITOT 0.42 12/11/2016 1017       Lab Results  Component Value Date   LABCA2 16 10/09/2011    No components found for: [id number]  No results for input(s): INR in the last 168 hours.  Urinalysis    Component Value Date/Time   COLORURINE YELLOW 06/05/2015 1559   APPEARANCEUR CLEAR 06/05/2015 1559   LABSPEC 1.020 06/05/2015 1559   PHURINE 5.5 06/05/2015 1559   GLUCOSEU NEGATIVE 06/05/2015 1559   HGBUR NEGATIVE 06/05/2015 1559   BILIRUBINUR NEGATIVE 06/05/2015 1559   KETONESUR NEGATIVE 06/05/2015 1559   UROBILINOGEN 0.2 06/05/2015 1559   NITRITE NEGATIVE 06/05/2015 1559   LEUKOCYTESUR MODERATE (A) 06/05/2015 1559    STUDIES: US Breast Ltd Uni Left Inc Axilla  Result Date: 10/27/2018 CLINICAL DATA:  Here for evaluation of a palpable and painful area of concern in the left breast at the lumpectomy site which has not changed since prior exam. Patient underwent left breast lumpectomy in August 2018. Patient also has a history of a right mastectomy. EXAM: DIGITAL DIAGNOSTIC LEFT MAMMOGRAM WITH CAD AND TOMO ULTRASOUND LEFT BREAST COMPARISON:  Previous exam(s). ACR Breast Density Category b: There are scattered areas of fibroglandular density. FINDINGS: An oval circumscribed mass at the lumpectomy site is not significantly changed since prior exam. This measures 3.2 cm in greatest dimension. No suspicious mass, microcalcification, or other finding is identified. Mammogram images are processed with CAD. Targeted left breast ultrasound was performed by the physician in the palpable and painful area of concern. At 8 o'clock 4 cm from the nipple an anechoic mass measuring 2.2 x 1.3 x 2.5 cm is seen at the lumpectomy site. This corresponds to the circumscribed mass seen on the mammogram and is consistent with a benign seroma. IMPRESSION: Benign seroma at the lumpectomy site in the left breast  corresponding to the painful and palpable area of concern. No evidence of malignancy. RECOMMENDATION: Recommend routine annual diagnostic mammogram in 1 year. Ultrasound-guided seroma drainage could be performed for symptomatic relief if the patient so desires. I have discussed the findings and recommendations with the patient. If applicable, a reminder letter will be sent to the patient regarding the next appointment. BI-RADS CATEGORY  2: Benign. Electronically Signed   By: Zerita Boers M.D.   On: 10/27/2018 16:23   Mm Diag Breast Tomo Uni Left  Result Date: 10/27/2018 CLINICAL DATA:  Here for evaluation of a palpable and painful area of concern in the left breast at the lumpectomy site which has not changed since prior exam. Patient underwent left breast lumpectomy in August 2018. Patient also has a history of a right mastectomy. EXAM: DIGITAL DIAGNOSTIC LEFT MAMMOGRAM WITH CAD AND TOMO ULTRASOUND LEFT BREAST COMPARISON:  Previous exam(s). ACR Breast Density Category b: There are scattered areas of fibroglandular density. FINDINGS: An oval circumscribed mass at the lumpectomy site is not significantly changed since prior exam. This measures 3.2 cm in greatest dimension. No suspicious mass, microcalcification, or other finding is identified. Mammogram images are processed with CAD. Targeted left breast ultrasound was performed by the physician in the palpable and painful area of concern. At 8 o'clock 4 cm from the nipple an anechoic mass measuring 2.2 x 1.3 x 2.5 cm is seen at the lumpectomy  site. This corresponds to the circumscribed mass seen on the mammogram and is consistent with a benign seroma. IMPRESSION: Benign seroma at the lumpectomy site in the left breast corresponding to the painful and palpable area of concern. No evidence of malignancy. RECOMMENDATION: Recommend routine annual diagnostic mammogram in 1 year. Ultrasound-guided seroma drainage could be performed for symptomatic relief if the  patient so desires. I have discussed the findings and recommendations with the patient. If applicable, a reminder letter will be sent to the patient regarding the next appointment. BI-RADS CATEGORY  2: Benign. Electronically Signed   By: Zerita Boers M.D.   On: 10/27/2018 16:23     ASSESSMENT: 78 y.o. [place] woman   (1) status post right lumpectomy and sentinel lymph node biopsy in September 2002 for multifocal breast carcinoma, triple negative.  Treated with CMF followed by radiation.   (2) Local recurrence in April 2004, status post right modified radical mastectomy with TRAM reconstruction for what proved to be a T1c N0, stage IA triple negative breast carcinoma.  Treated adjuvantly with paclitaxel and doxorubicin x4 in 2004.  Off treatment since August 2004 with no evidence of recurrence  (3) history of papillary thyroid cancer s/p thyroidectomy June 2007, s/p radioactive iodine July 2007  (4) status post left breast upper outer quadrant biopsy 08/21/2016 for a clinical T1b N0, stage IB invasive ductal carcinoma, grade 3, weakly estrogen receptor positive, progesterone receptor and HER-2 negative, with an MIB-1 of 80%.  (5) left lumpectomy and sentinel lymph node sampling 09/11/2016 found a pT1b pN0, stage IB invasive ductal carcinoma, grade 3, with negative margins.  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*) (a) there were no deleterious mutations in ALK, APC, ATM, AXIN2, BAP1, BARD1, BLM, BMPR1A, BRCA1, BRCA2,  BRIP1, CASR, CDC73, CDH1, CDK4, CDKN1B, CDKN1C, CDKN2A, CEBPA, CHEK2, CTNNA1, DICER1, DIS3L2, EGFR, EPCAM, FH, FLCN, GATA2, GPC3, GREM1, HOXB13, HRAS, KIT, MAX, MEN1, MET, MITF, MLH1, MSH2, MSH3, MSH6, MUTYH, NBN, NF1, NF2, NTHL1, PALB2, PDGFRA, PHOX2B, PMS2, POLD1, POLE, POT1, PRKAR1A, PTCH1, PTEN, RAD50, RAD51C, RAD51D, RB1, RECQL4, RET, RUNX1, SDHA, SDHAF2, SDHB, SDHC, SDHD, SMAD4, SMARCA4, SMARCB1, SMARCE1, STK11, SUFU, TERC, TERT, TMEM127, TP53, TSC1, TSC2, VHL, WRN, WT1 (b) while homozygous mutations in the NPH L1 gene are associated with autosomal recessive polyposis, there is no evidence to suggest that a single mutation in this gene increases cancer risk.  (9) started anastrozole 03/14/2017 (a) bone density on 05/06/2017 showed a T score of -3.4--osteoporosis (b) started denosumab/Prolia June 2019, to be repeated every 6 months   PLAN:  PeRoselas doing well today.  She has clinical or radiographic sign of breast cancer recurrence.  She continues on Anastrozole with good tolerance.    Her mammogram is due again in 10/2019.    We reviewed healthy diet and exercise today in detail.  I discussed calcium, vitamin d and weight bearing exercises.  I recommended she continue to f/u with her PCP regularly and stay up to date with colon and skin cancer screening.  PeJuliyaill return in  6 months for labs and Prolia and in 1 year for labs,f/u and Prolia.  She was recommended to continue with the appropriate pandemic precautions. She knows to call for any questions that may arise between now and her next appointment.  We are happy to see her sooner if needed.  A total of (30) minutes of face-to-face time was spent with this patient with greater than 50% of that time in counseling and care-coordination.    Wilber Bihari, NP  11/19/18 1:55 PM Medical Oncology and Hematology [facility] [address] [place], [place] [address] Tel. [phone]    Fax. [phone]

## 2018-11-20 ENCOUNTER — Telehealth: Payer: Self-pay | Admitting: Oncology

## 2018-11-20 NOTE — Telephone Encounter (Signed)
I left a message regarding schedule  

## 2018-11-22 NOTE — Assessment & Plan Note (Signed)
See cryo 

## 2018-12-17 ENCOUNTER — Other Ambulatory Visit: Payer: Self-pay | Admitting: Internal Medicine

## 2019-01-06 ENCOUNTER — Other Ambulatory Visit: Payer: Self-pay

## 2019-03-02 ENCOUNTER — Ambulatory Visit: Payer: Medicare Other | Admitting: Internal Medicine

## 2019-03-10 ENCOUNTER — Ambulatory Visit: Payer: Medicare Other | Admitting: Internal Medicine

## 2019-03-15 ENCOUNTER — Ambulatory Visit (INDEPENDENT_AMBULATORY_CARE_PROVIDER_SITE_OTHER): Payer: Medicare Other | Admitting: Family

## 2019-03-15 ENCOUNTER — Other Ambulatory Visit: Payer: Self-pay | Admitting: Family

## 2019-03-15 ENCOUNTER — Ambulatory Visit: Payer: Self-pay

## 2019-03-15 ENCOUNTER — Encounter: Payer: Self-pay | Admitting: Family

## 2019-03-15 ENCOUNTER — Ambulatory Visit (INDEPENDENT_AMBULATORY_CARE_PROVIDER_SITE_OTHER): Payer: Medicare Other | Admitting: Family Medicine

## 2019-03-15 ENCOUNTER — Other Ambulatory Visit: Payer: Self-pay

## 2019-03-15 ENCOUNTER — Encounter: Payer: Self-pay | Admitting: Family Medicine

## 2019-03-15 VITALS — BP 106/82 | HR 102 | Temp 97.8°F | Ht 63.0 in | Wt 147.4 lb

## 2019-03-15 VITALS — BP 106/82 | HR 102 | Ht 63.0 in | Wt 147.0 lb

## 2019-03-15 DIAGNOSIS — L989 Disorder of the skin and subcutaneous tissue, unspecified: Secondary | ICD-10-CM | POA: Diagnosis not present

## 2019-03-15 DIAGNOSIS — M25532 Pain in left wrist: Secondary | ICD-10-CM

## 2019-03-15 DIAGNOSIS — M62838 Other muscle spasm: Secondary | ICD-10-CM

## 2019-03-15 DIAGNOSIS — M659 Synovitis and tenosynovitis, unspecified: Secondary | ICD-10-CM | POA: Diagnosis not present

## 2019-03-15 NOTE — Progress Notes (Signed)
[name] [name] is a 78 y.o. female with the following history as recorded in [employer]:  Patient Active Problem List   Diagnosis Date Noted  . Right ear pain 06/27/2018  . Coronary artery disease 01/20/2018  . Breast cyst, left 09/17/2017  . Aortic atherosclerosis ([place]) 07/28/2017  . Osteoporosis 05/07/2017  . Genetic testing 12/05/2016  . Family history of breast cancer   . Port catheter in place 10/23/2016  . Encounter for antineoplastic chemotherapy 10/23/2016  . Malignant neoplasm of upper-outer quadrant of left breast in female, estrogen receptor positive ([place]) 07/29/2016  . Well adult exam 06/05/2015  . Neoplasm of uncertain behavior of skin 06/05/2015  . Adenopathy, cervical 06/05/2015  . Tachycardia 06/07/2014  . Wart viral 10/18/2011  . Hyperglycemia 11/06/2010  . Actinic keratoses 05/25/2010  . Essential hypertension 02/23/2010  . SHOULDER PAIN 11/22/2009  . Chronic fatigue 11/08/2009  . RLQ PAIN 11/08/2009  . VERTIGO 07/11/2009  . ECZEMA 10/19/2008  . CT, CHEST, ABNORMAL 05/30/2008  . STYE 02/17/2008  . B12 deficiency 12/16/2007  . Vitamin D deficiency 12/16/2007  . HYPOTHYROIDISM, POSTSURGICAL 12/01/2007  . Acute maxillary sinusitis 11/11/2007  . ALLERGIC RESPIRATORY DISEASE, EXTRINSIC 11/11/2007  . THYROID CANCER 08/19/2007  . Dyslipidemia 08/19/2007  . Anxiety state 08/19/2007  . Situational depression 08/19/2007  . Allergic rhinitis 08/19/2007  . BREAST CANCER, HX OF 08/19/2007    Current Outpatient Medications  Medication Sig Dispense Refill  . ALPRAZolam (XANAX) 1 MG tablet TAKE 0.5-1 TABLETS (0.5-1 MG TOTAL) BY MOUTH 2 (TWO) TIMES DAILY AS NEEDED FOR ANXIETY. 60 tablet 3  . anastrozole (ARIMIDEX) 1 MG tablet TAKE 1 TABLET BY MOUTH EVERY DAY 90 tablet 4  . cholecalciferol (VITAMIN D) 1000 units tablet Take 2 tablets (2,000 Units total) by mouth daily. 100 tablet 3  . Cyanocobalamin (VITAMIN B-12 IJ) Inject 1,000 mcg as directed every 30 (thirty) days.     . DULoxetine (CYMBALTA) 60 MG capsule TAKE 1 CAPSULE BY MOUTH EVERY DAY IN THE MORNING 90 capsule 1  . levothyroxine (SYNTHROID) 112 MCG tablet TAKE 1 TABLET (112 MCG TOTAL) BY MOUTH DAILY. 90 tablet 3  . loratadine (CLARITIN) 10 MG tablet Take 1 tablet (10 mg total) by mouth daily. 30 tablet 11  . meclizine (ANTIVERT) 12.5 MG tablet 1-2 tab by mouth every 8 hrs as needed 40 tablet 1  . rosuvastatin (CRESTOR) 20 MG tablet AS DIRECTED 90 tablet 1  . telmisartan (MICARDIS) 40 MG tablet Take 0.5 tablets (20 mg total) by mouth daily. 90 tablet 3  . triamcinolone (NASACORT) 55 MCG/ACT AERO nasal inhaler Place 2 sprays into the nose daily. 1 Inhaler 12  . triamcinolone cream (KENALOG) 0.5 % Apply 1 application topically 4 (four) times daily. 90 g 1  . venlafaxine XR (EFFEXOR-XR) 75 MG 24 hr capsule Take 1 capsule (75 mg total) daily with breakfast by mouth. 30 capsule 6  . vitamin B-12 (CYANOCOBALAMIN) 1000 MCG tablet Take 1,000 mcg by mouth every other day.     No current facility-administered medications for this visit.    Allergies: Pneumococcal vaccines, Sulfa antibiotics, Sulfacetamide sodium-sulfur, Tape, and Tegaderm ag mesh [silver]  Past Medical History:  Diagnosis Date  . Allergic rhinitis   . Anxiety   . Breast cancer ([place]) hx 2004   recurrent 2006 DR Magrinat  . Family history of breast cancer   . Genetic testing 12/05/2016   Multi-Cancer panel (83 genes) @ [facility] - Monoallelic mutation in [place] (carrier)  . HTN (hypertension)   .  Hyperlipidemia   . Hypothyroidism   . Personal history of chemotherapy 2002  . Personal history of radiation therapy 2002  . Psoriasis   . S/P thyroidectomy 07/2005   2 cm largest diameter (1 other tiny focus)/ i-131 rx 99 mci 08/2005  . Thyroid cancer ([place])    Papillary Stage 1 - Dr Loanne Drilling  . Vitamin B12 deficiency 2009  . Vitamin D deficiency 2009    Past Surgical History:  Procedure Laterality Date  . BREAST LUMPECTOMY    . BREAST  LUMPECTOMY WITH RADIOACTIVE SEED AND SENTINEL LYMPH NODE BIOPSY Left 09/11/2016   Procedure: LEFT BREAST LUMPECTOMY WITH RADIOACTIVE SEED AND LEFT AXILLARY SENTINEL LYMPH NODE BIOPSY WITH BLUE DYE INJECTION;  Surgeon: [name], MD;  Location: [facility];  Service: General;  Laterality: Left;  . IR FLUORO GUIDE PORT INSERTION RIGHT  09/13/2016  . IR REMOVAL TUN ACCESS W/ PORT W/O FL MOD SED  12/30/2016  . IR US GUIDE VASC ACCESS RIGHT  09/13/2016  . MASTECTOMY     Right  . PORTACATH PLACEMENT N/A 09/11/2016   Procedure: ATTEMPTED INSERTION PORT-A-CATH WITH ULTRA SOUND GUIDANCE;  Surgeon: [name], MD;  Location: [facility];  Service: General;  Laterality: N/A;  . REDUCTION MAMMAPLASTY Left 2005  . THYROIDECTOMY  2007    Family History  Problem Relation Age of Onset  . Stroke Mother   . Allergies Mother   . Asthma Mother   . Clotting disorder Mother   . Heart disease Father 71       MI  . Allergies Sister   . Asthma Sister   . Breast cancer Sister 41       Lymphoma 95; currently 106  . Lymphoma Sister   . Asthma Brother   . Leukemia Brother        dx 52s  . Allergies Daughter   . Asthma Daughter   . Allergies Sister   . Breast cancer Sister 51       currently 20  . Kidney cancer Paternal Aunt        kidney ca; deceased 85  . Liver cancer Paternal Uncle        unk. primary ("liver")  . Throat cancer Maternal Grandmother        deceased 72  . Lung cancer Maternal Grandfather        deceased 32  . Pancreatic cancer Paternal Grandfather        deceased 42  . Cancer Paternal Aunt        "abdominal"; deceased 55    Social History   Tobacco Use  . Smoking status: Never Smoker  . Smokeless tobacco: Never Used  Substance Use Topics  . Alcohol use: No    Subjective:  Left inner wrist pain x 1 week; no known injury or trauma; no numbness or tingling; can feel a "lump" on her inner wrist and feels like it is causing pain up into her left  shoulder;    Objective:  Vitals:   03/15/19 1455  BP: 106/82  Pulse: (!) 102  Temp: 97.8 F (36.6 C)  TempSrc: Oral  SpO2: 98%  Weight: 147 lb 6.4 oz (66.9 kg)  Height: 5\' 3"  (1.6 m)    General: Well developed, well nourished, in no acute distress  Skin : Warm and dry.  Head: Normocephalic and atraumatic  Lungs: Respirations unlabored;  Musculoskeletal: No deformities; no active joint inflammation; soft movable lesion on medial left wrist  Extremities: No edema, cyanosis, clubbing  Vessels: Symmetric bilaterally  Neurologic: Alert and oriented; speech intact; face symmetrical; moves all extremities well; CNII-XII intact without focal deficit   Assessment:  1. Acute pain of left wrist     Plan:  ? Ganglion type cyst vs muscular source; recommend to see sports medicine provider for imaging and better clarification of source of symptoms; follow-up to be determined.    This visit occurred during the SARS-CoV-2 public health emergency.  Safety protocols were in place, including screening questions prior to the visit, additional usage of staff PPE, and extensive cleaning of exam room while observing appropriate contact time as indicated for disinfecting solutions.     No follow-ups on file.  No orders of the defined types were placed in this encounter.   Requested Prescriptions    No prescriptions requested or ordered in this encounter

## 2019-03-15 NOTE — Progress Notes (Signed)
Subjective:    CC: L wrist pain  I, [name] [name], LAT, ATC, am serving as scribe for Dr. Lynne Leader.  HPI: Pt is a 79 y/o female presenting w/ c/o L radial-sided wrist pain and swelling x approximately one week.  Pt rates her pain at a 8/10 and describes it as a sharp, burning pain.  She also reports a "pressure point" in her L upper trap area that will send pain from her L upper trap/shoulder region into her L UE.  Radiating pain: No Swelling: Yes Numbness/tingling: No but notices change in temperature in her L hand Aggravating factors: Picking up her purse or heavier items w/ her L wrist; forearm supination Treatments tried: Ice, Extra strength Tylenol; wrist splint   Additionally she notes a scaly lesion on her face just lateral to the right side of her nose in the interlabial fold.  She has had squamous cell carcinoma status post Mohs surgery in this area and is a bit worried about it.   Pertinent review of Systems: No fevers or chills  Relevant historical information: History of squamosal carcinoma right side of face.   Objective:    Vitals:   03/15/19 1515  BP: 106/82  Pulse: (!) 102  SpO2: 98%   General: Well Developed, well nourished, and in no acute distress.   MSK:  Left hand and wrist: Slight swelling at volar radial wrist.  Mildly tender in this area.  Normal wrist motion some pain with resisted wrist flexion. Pulses cap refill and sensation intact distally.  Left C-spine and trapezius.  Normal C-spine appearance. Normal cervical motion.  Mild tender palpation left trapezius.    Skin: Small 1 to 2 mm scaly lesion nasal fold right side of face.  Lab and Radiology Results  Limited musculoskeletal ultrasound left wrist reveals no significant ganglion cyst at the volar wrist.  Area of concern is the insertion of the flexor carpi radialis tendon.  Tendon itself looks relatively normal with some soft tissue swelling around the tendon with mild hypoechoic  change.  Mild increased vascular signal on Doppler in this area as well. Normal bony structures. Impression: Flexor carpi radialis tendinitis.   Impression and Recommendations:    Assessment and Plan: 79 y.o. female with  Left wrist pain: Flexor carpi radialis tendinitis.  Discussed options.  Plan for trial of diclofenac gel and physical therapy.  Reviewed some home exercise program prior to discharge as well.  Left trapezius pain: Strain spasm.  Again this will be very treatable with physical therapy.  Recommend dry needling.  If not improving would proceed with trigger point injection however this area concern for accidental scalene block is certainly a possibility.  Skin lesion face: Unclear etiology.  Certainly could be seborrheic dermatitis versus for return of squamous cell carcinoma.  Recommend follow-up with PCP in near future to discuss possible shave biopsy.   PDMP not reviewed this encounter. Orders Placed This Encounter  Procedures   [place] LIMITED JOINT SPACE STRUCTURES UP LEFT(NO LINKED CHARGES)    Order Specific Question:   Reason for Exam (SYMPTOM  OR DIAGNOSIS REQUIRED)    Answer:   L wrist pain    Order Specific Question:   Preferred imaging location?    Answer:   [facility]   Ambulatory referral to Physical Therapy    Referral Priority:   Routine    Referral Type:   Physical Medicine    Referral Reason:   Specialty Services Required  Requested Specialty:   Physical Therapy   No orders of the defined types were placed in this encounter.   Discussed warning signs or symptoms. Please see discharge instructions. Patient expresses understanding.   The above documentation has been reviewed and is accurate and complete Lynne Leader

## 2019-03-15 NOTE — Patient Instructions (Signed)
Thank you for coming in today. I think you have tendonitis in the wrist of the  Flexor Carpi Radialis tendon.  Attend physical therapy and use voltaren gel on the wrist up to 4x daily.  Recheck if not improving.   I also think you are having spasm and cramping or the trapezius muscle in the neck.  Physical therapy will help as well.   I will also ask Dr Mamie Nick about the scaly spot on your face.    Flexor Carpi Ulnaris and Medtronic Radialis Tendinitis Rehab Ask your health care provider which exercises are safe for you. Do exercises exactly as told by your health care provider and adjust them as directed. It is normal to feel mild stretching, pulling, tightness, or discomfort as you do these exercises. Stop right away if you feel sudden pain or your pain gets worse. Do not begin these exercises until told by your health care provider. Range-of-motion exercises These exercises warm up your muscles and joints and improve the movement and flexibility of your forearm. These exercises also help to relieve pain, numbness, and tingling. They are done using the muscles in your injured forearm. Wrist flexion 1. Sit or stand with your left / right elbow bent to a 90-degree angle (right angle) at your side and your palm facing the floor. 2. Slowly bend your wrist so that your fingers move toward the floor (flexion), stopping when you feel a gentle stretch over the back of your forearm. 3. Hold this position for __________ seconds. 4. Slowly return to the starting position. Repeat __________ times. Complete this exercise __________ times a day. Wrist extension 1. Sit or stand with your left / right elbow bent to a 90-degree angle (right angle) at your side and your palm facing the floor. 2. Slowly lift your wrist up so that your fingers move toward the ceiling (extension), stopping when you feel a gentle stretch on the palm side of your forearm. 3. Hold this position for __________ seconds. 4. Slowly  return to the starting position. Repeat __________ times. Complete this exercise __________ times a day. Forearm rotation, supination 1. Sit or stand with your left / right elbow bent to a 90-degree angle (right angle) at your side. Position your forearm so that the thumb is facing the ceiling (neutral position). 2. Turn (rotate) your palm up toward the ceiling (supination) until you feel a gentle stretch on the inside of your forearm. 3. Hold this position for __________ seconds. 4. Slowly return your palm to the starting position. Repeat __________ times. Complete this exercise __________ times a day. Forearm rotation, pronation 1. Sit or stand with your left / right elbow bent to a 90-degree angle (right angle) at your side. Position your forearm so that the thumb is facing the ceiling (neutral position). 2. Turn (rotate) your palm down toward the floor (pronation) until you feel a gentle stretch on the top of your forearm. 3. Hold this position for __________ seconds. 4. Slowly return your palm to the starting position. Repeat __________ times. Complete this exercise __________ times a day. Stretching exercises These exercises warm up your muscles and joints and improve the movement and flexibility of your forearm. These exercises also help to relieve pain, numbness, and tingling. They are done using your healthy forearm to help stretch the muscles in your injured forearm. Wrist flexion, assisted  1. Sit or stand and extend your left / right arm in front of you. Turn your palm down toward the floor and  relax your wrist. 2. With your other hand (assisted), gently push on the back of your hand to bend your wrist and fingers toward the floor (flexion). Stop when you feel a gentle stretch on the top of your forearm. 3. Hold this position for __________ seconds. 4. Slowly return to the starting position. Repeat __________ times. Complete this exercise __________ times a day. Wrist extension,  assisted  1. Sit or stand and extend your left / right arm in front of you. Turn your palm up toward the ceiling and relax your wrist. 2. With your other hand (assisted), gently pull your palm and fingertips back so your fingers point down toward the floor (extension). You should feel a gentle stretch on the palm-side of your forearm. 3. Hold this position for __________ seconds. 4. Slowly return to the starting position. Repeat __________ times. Complete this exercise __________ times a day. Forearm rotation, supination 1. Sit with your left / right elbow bent to a 90-degree angle (right angle) with your forearm resting on a table. 2. Keeping your upper body and shoulder still, use your other hand to turn (rotate) your forearm palm-up (supination) until you feel a gentle to moderate stretch. 3. Hold this position for __________ seconds. 4. Slowly release the stretch, and return to the starting position. Repeat __________ times. Complete this exercise __________ times a day. Forearm rotation, pronation 1. Sit with your left / right elbow bent to a 90-degree angle (right angle) with your forearm resting on a table. 2. Keeping your upper body and shoulder still, use your other hand to turn (rotate) your forearm palm down (pronation) until you feel a gentle to moderate stretch. 3. Hold this position for __________ seconds. 4. Slowly release the stretch, and return to the starting position. Repeat __________ times. Complete this exercise __________ times a day. Strengthening exercises These exercises build strength and endurance in your wrist and forearm. Endurance is the ability to use your muscles for a long time, even after they get tired. Wrist flexion  1. Sit with your left / right forearm supported on a table and your hand resting palm-up over the edge of the table. Your elbow should be bent to a 90-degree angle (right angle) and rest below the level of your shoulder. 2. Hold a __________  weight in your hand. Or, hold a rubber exercise band or tube in both hands, keeping your hands at the same level and hip distance apart. There should be a slight tension in the exercise band or tube. 3. Slowly lift your hand up toward the ceiling (flexion). 4. Hold this position for __________ seconds. 5. Slowly lower your hand back to the starting position. Repeat __________ times. Complete this exercise __________ times a day. Wrist extension  1. Sit with your left / right forearm supported on a table and your hand resting palm-down over the edge of the table. Your elbow should be bent to a 90-degree angle (right angle) and rest below the level of your shoulder. 2. Hold a __________ weight in your hand. Or, hold a rubber exercise band or tube in both hands, keeping your hands at the same level and hip distance apart. There should be a slight tension in the exercise band or tube. 3. Slowly lift your hand up toward the ceiling (extension). 4. Hold this position for __________ seconds. 5. Slowly lower your hand back to the starting position. Repeat __________ times. Complete this exercise __________ times a day. Ulnar deviation 1. Stand with a __________  weight in your left / right hand. Or, sit with your healthy hand supported, and hold on to a rubber exercise band or tube with both hands. There should be a slight tension in the exercise band or tube. 2. Move your wrist so your pinkie travels toward your forearm and your thumb moves away from your forearm (ulnar deviation). 3. Hold this position for __________ seconds. 4. Slowly return to the starting position. Repeat __________ times. Complete this exercise __________ times a day. Radial deviation 1. Stand with a __________ weight in your left / right hand. Or, sit and hold on to a rubber exercise band or tube with both hands while your left / right arm is supported on a table and the other arm is below the table. There should be a slight  tension in the exercise band or tube. ? If you are holding a weight, raise your left / right hand so your thumb moves toward your forearm at a comfortable height. You will not need to raise your hand very far. ? If you are holding an exercise band or tube, pull on it to raise your thumb toward the ceiling. You will not need to raise your hand very far. 2. Hold this position for __________ seconds. 3. Slowly lower your wrist to the starting position. Repeat __________ times. Complete this exercise __________ times a day. Forearm rotation, supination  1. Sit with your left / right forearm supported on a table. Keep your hand resting palm-down and your wrist over the edge of the table. Your elbow should be at your side and bent to a 90-degree angle (right angle). Keep your wrist stable. Do not allow it to bend backward or forward during the exercise. 2. Gently hold a lightweight hammer with your left / right hand. 3. Without moving your elbow or wrist, slowly turn (rotate) your forearm so your palm faces up toward the ceiling (supination). 4. Hold this position for __________ seconds. 5. Slowly return your forearm to the starting position. Repeat __________ times. Complete this exercise __________ times a day. Forearm rotation, pronation  1. Sit with your left / right forearm supported on a table. Keep your hand resting palm-up and your wrist over the edge of the table. Your elbow should be at your side and bent to a 90-degree angle (right angle). Keep your wrist stable. Do not allow it to bend backward or forward during the exercise. 2. Gently hold a lightweight hammer with your left / right hand. 3. Without moving your elbow or wrist, slowly turn (rotate) your palm down toward the floor (pronation). 4. Hold this position for __________ seconds. 5. Slowly return your forearm to the starting position. Repeat __________ times. Complete this exercise __________ times a day. Grip  1. Hold one of  these items in your left / right hand: modeling clay, therapy putty, a dense sponge, a stress ball, or a large, rolled sock. 2. Squeeze as hard as you can without increasing any pain. 3. Hold this position for __________ seconds. 4. Slowly release your grip. Repeat __________ times. Complete this exercise __________ times a day. This information is not intended to replace advice given to you by your health care provider. Make sure you discuss any questions you have with your health care provider. Document Revised: 05/26/2018 Document Reviewed: 04/02/2018 Elsevier Patient Education  [place].

## 2019-03-23 ENCOUNTER — Encounter: Payer: Self-pay | Admitting: Internal Medicine

## 2019-03-23 ENCOUNTER — Other Ambulatory Visit: Payer: Self-pay

## 2019-03-23 ENCOUNTER — Ambulatory Visit (INDEPENDENT_AMBULATORY_CARE_PROVIDER_SITE_OTHER): Payer: Medicare Other | Admitting: Internal Medicine

## 2019-03-23 VITALS — BP 118/76 | HR 110 | Temp 97.8°F | Ht 63.0 in | Wt 148.4 lb

## 2019-03-23 DIAGNOSIS — R739 Hyperglycemia, unspecified: Secondary | ICD-10-CM

## 2019-03-23 DIAGNOSIS — I10 Essential (primary) hypertension: Secondary | ICD-10-CM | POA: Diagnosis not present

## 2019-03-23 DIAGNOSIS — E559 Vitamin D deficiency, unspecified: Secondary | ICD-10-CM

## 2019-03-23 DIAGNOSIS — F411 Generalized anxiety disorder: Secondary | ICD-10-CM | POA: Diagnosis not present

## 2019-03-23 DIAGNOSIS — H6123 Impacted cerumen, bilateral: Secondary | ICD-10-CM

## 2019-03-23 DIAGNOSIS — E538 Deficiency of other specified B group vitamins: Secondary | ICD-10-CM | POA: Diagnosis not present

## 2019-03-23 DIAGNOSIS — M25532 Pain in left wrist: Secondary | ICD-10-CM

## 2019-03-23 DIAGNOSIS — M25539 Pain in unspecified wrist: Secondary | ICD-10-CM | POA: Insufficient documentation

## 2019-03-23 DIAGNOSIS — L570 Actinic keratosis: Secondary | ICD-10-CM | POA: Diagnosis not present

## 2019-03-23 DIAGNOSIS — H6120 Impacted cerumen, unspecified ear: Secondary | ICD-10-CM | POA: Insufficient documentation

## 2019-03-23 NOTE — Assessment & Plan Note (Signed)
Bilateral impaction-see procedure

## 2019-03-23 NOTE — Assessment & Plan Note (Signed)
Xanax as needed. °

## 2019-03-23 NOTE — Assessment & Plan Note (Signed)
See procedure.  Will biopsy if no resolution with cryosurgery

## 2019-03-23 NOTE — Patient Instructions (Signed)
Vaccine line [phone]

## 2019-03-23 NOTE — Assessment & Plan Note (Signed)
On vitamin B12 

## 2019-03-23 NOTE — Progress Notes (Signed)
Subjective:  Patient ID: [name] [name], female    DOB: [birth year]  Age: 78 y.o. MRN: [id number]  CC: No chief complaint on file.   HPI [name] [name] presents for occasional dizziness and ears being stopped, skin lesions on the face, left wrist pain Follow-up on anxiety and vitamin D deficiency L wrist pain is not better  Outpatient Medications Prior to Visit  Medication Sig Dispense Refill  . ALPRAZolam (XANAX) 1 MG tablet TAKE 0.5-1 TABLETS (0.5-1 MG TOTAL) BY MOUTH 2 (TWO) TIMES DAILY AS NEEDED FOR ANXIETY. 60 tablet 3  . anastrozole (ARIMIDEX) 1 MG tablet TAKE 1 TABLET BY MOUTH EVERY DAY 90 tablet 4  . cholecalciferol (VITAMIN D) 1000 units tablet Take 2 tablets (2,000 Units total) by mouth daily. 100 tablet 3  . Cyanocobalamin (VITAMIN B-12 IJ) Inject 1,000 mcg as directed every 30 (thirty) days.    . DULoxetine (CYMBALTA) 60 MG capsule TAKE 1 CAPSULE BY MOUTH EVERY DAY IN THE MORNING 90 capsule 1  . levothyroxine (SYNTHROID) 112 MCG tablet TAKE 1 TABLET (112 MCG TOTAL) BY MOUTH DAILY. 90 tablet 3  . loratadine (CLARITIN) 10 MG tablet Take 1 tablet (10 mg total) by mouth daily. 30 tablet 11  . meclizine (ANTIVERT) 12.5 MG tablet 1-2 tab by mouth every 8 hrs as needed 40 tablet 1  . rosuvastatin (CRESTOR) 20 MG tablet AS DIRECTED 90 tablet 1  . telmisartan (MICARDIS) 40 MG tablet Take 0.5 tablets (20 mg total) by mouth daily. 90 tablet 3  . triamcinolone (NASACORT) 55 MCG/ACT AERO nasal inhaler Place 2 sprays into the nose daily. 1 Inhaler 12  . triamcinolone cream (KENALOG) 0.5 % Apply 1 application topically 4 (four) times daily. 90 g 1  . venlafaxine XR (EFFEXOR-XR) 75 MG 24 hr capsule Take 1 capsule (75 mg total) daily with breakfast by mouth. 30 capsule 6  . vitamin B-12 (CYANOCOBALAMIN) 1000 MCG tablet Take 1,000 mcg by mouth every other day.     No facility-administered medications prior to visit.    ROS: Review of Systems  Constitutional: Negative for activity  change, appetite change, chills, fatigue and unexpected weight change.  HENT: Negative for congestion, mouth sores and sinus pressure.   Eyes: Negative for visual disturbance.  Respiratory: Negative for cough and chest tightness.   Gastrointestinal: Negative for abdominal pain and nausea.  Genitourinary: Negative for difficulty urinating, frequency and vaginal pain.  Musculoskeletal: Positive for arthralgias. Negative for back pain and gait problem.  Skin: Negative for pallor and rash.  Neurological: Positive for dizziness. Negative for tremors, weakness, numbness and headaches.  Psychiatric/Behavioral: Negative for confusion and sleep disturbance. The patient is nervous/anxious.   Left wrist is tender in the radial aspect  Objective:  BP 118/76 (BP Location: Right Arm, Patient Position: Sitting, Cuff Size: Normal)   Pulse (!) 110   Temp 97.8 F (36.6 C) (Oral)   Ht 5\' 3"  (1.6 m)   Wt 148 lb 6 oz (67.3 kg)   SpO2 96%   BMI 26.28 kg/m   BP Readings from Last 3 Encounters:  03/23/19 118/76  03/15/19 106/82  03/15/19 106/82    Wt Readings from Last 3 Encounters:  03/23/19 148 lb 6 oz (67.3 kg)  03/15/19 147 lb (66.7 kg)  03/15/19 147 lb 6.4 oz (66.9 kg)    Physical Exam Constitutional:      General: She is not in acute distress.    Appearance: She is well-developed.  HENT:     Head:  Normocephalic.     Right Ear: External ear normal. There is impacted cerumen.     Left Ear: External ear normal. There is impacted cerumen.     Nose: Nose normal.  Eyes:     General:        Right eye: No discharge.        Left eye: No discharge.     Conjunctiva/sclera: Conjunctivae normal.     Pupils: Pupils are equal, round, and reactive to light.  Neck:     Thyroid: No thyromegaly.     Vascular: No JVD.     Trachea: No tracheal deviation.  Cardiovascular:     Rate and Rhythm: Normal rate and regular rhythm.     Heart sounds: Normal heart sounds.  Pulmonary:     Effort: No  respiratory distress.     Breath sounds: No stridor. No wheezing.  Abdominal:     General: Bowel sounds are normal. There is no distension.     Palpations: Abdomen is soft. There is no mass.     Tenderness: There is no abdominal tenderness. There is no guarding or rebound.  Musculoskeletal:        General: Tenderness present.     Cervical back: Normal range of motion and neck supple.  Lymphadenopathy:     Cervical: No cervical adenopathy.  Skin:    Findings: No erythema or rash.  Neurological:     Cranial Nerves: No cranial nerve deficit.     Motor: No abnormal muscle tone.     Coordination: Coordination normal.     Deep Tendon Reflexes: Reflexes normal.  Psychiatric:        Behavior: Behavior normal.        Thought Content: Thought content normal.        Judgment: Judgment normal.    Left wrist is tender in the radial aspect Bilateral wax impaction AK's on the right cheek x3   Procedure Note :     Procedure : Cryosurgery   Indication:   Actinic keratosis(es)   Risks including unsuccessful procedure , bleeding, infection, bruising, scar, a need for a repeat  procedure and others were explained to the patient in detail as well as the benefits. Informed consent was obtained verbally.   3 small lesion(s)  on right cheek was/were treated with liquid nitrogen on a Q-tip in a usual fasion . Band-Aid was applied and antibiotic ointment was given for a later use.   Tolerated well. Complications none.   Postprocedure instructions :     Keep the wounds clean. You can wash them with liquid soap and water. Pat dry with gauze or a Kleenex tissue  Before applying antibiotic ointment and a Band-Aid.   You need to report immediately  if  any signs of infection develop.    Procedure Note :     Procedure :  Ear irrigation right and left ears   Indication:  Cerumen impaction right and left ears   Risks, including pain, dizziness, eardrum perforation, bleeding, infection and others as  well as benefits were explained to the patient in detail. Verbal consent was obtained and the patient agreed to proceed.    We used "The Elephant Ear Irrigation Device" filled with lukewarm water for irrigation. A large amount wax was recovered from both ears. Procedure has also required manual wax removal/instrumentation with an ear wax curette and ear forceps on the right and left ears.   Tolerated well. Complications: None.   Postprocedure instructions :  Call if problems.   ACE wrap Lab Results  Component Value Date   WBC 5.9 11/19/2018   HGB 13.1 11/19/2018   HCT 39.9 11/19/2018   PLT 283 11/19/2018   GLUCOSE 186 (H) 11/19/2018   CHOL 205 (H) 06/05/2015   TRIG 257.0 (H) 06/05/2015   HDL 44.60 06/05/2015   LDLDIRECT 125.0 06/05/2015   LDLCALC 122 (H) 12/04/2007   ALT 13 11/19/2018   AST 20 11/19/2018   NA 138 11/19/2018   K 3.6 11/19/2018   CL 103 11/19/2018   CREATININE 1.28 (H) 11/19/2018   BUN 13 11/19/2018   CO2 24 11/19/2018   TSH 0.65 08/12/2018   INR 0.98 12/30/2016   HGBA1C 5.4 12/05/2010    US BREAST LTD UNI LEFT INC AXILLA  Result Date: 10/27/2018 CLINICAL DATA:  Here for evaluation of a palpable and painful area of concern in the left breast at the lumpectomy site which has not changed since prior exam. Patient underwent left breast lumpectomy in August 2018. Patient also has a history of a right mastectomy. EXAM: DIGITAL DIAGNOSTIC LEFT MAMMOGRAM WITH CAD AND TOMO ULTRASOUND LEFT BREAST COMPARISON:  Previous exam(s). ACR Breast Density Category b: There are scattered areas of fibroglandular density. FINDINGS: An oval circumscribed mass at the lumpectomy site is not significantly changed since prior exam. This measures 3.2 cm in greatest dimension. No suspicious mass, microcalcification, or other finding is identified. Mammogram images are processed with CAD. Targeted left breast ultrasound was performed by the physician in the palpable and painful area of concern.  At 8 o'clock 4 cm from the nipple an anechoic mass measuring 2.2 x 1.3 x 2.5 cm is seen at the lumpectomy site. This corresponds to the circumscribed mass seen on the mammogram and is consistent with a benign seroma. IMPRESSION: Benign seroma at the lumpectomy site in the left breast corresponding to the painful and palpable area of concern. No evidence of malignancy. RECOMMENDATION: Recommend routine annual diagnostic mammogram in 1 year. Ultrasound-guided seroma drainage could be performed for symptomatic relief if the patient so desires. I have discussed the findings and recommendations with the patient. If applicable, a reminder letter will be sent to the patient regarding the next appointment. BI-RADS CATEGORY  2: Benign. Electronically Signed   By: Zerita Boers M.D.   On: 10/27/2018 16:23   MM DIAG BREAST TOMO UNI LEFT  Result Date: 10/27/2018 CLINICAL DATA:  Here for evaluation of a palpable and painful area of concern in the left breast at the lumpectomy site which has not changed since prior exam. Patient underwent left breast lumpectomy in August 2018. Patient also has a history of a right mastectomy. EXAM: DIGITAL DIAGNOSTIC LEFT MAMMOGRAM WITH CAD AND TOMO ULTRASOUND LEFT BREAST COMPARISON:  Previous exam(s). ACR Breast Density Category b: There are scattered areas of fibroglandular density. FINDINGS: An oval circumscribed mass at the lumpectomy site is not significantly changed since prior exam. This measures 3.2 cm in greatest dimension. No suspicious mass, microcalcification, or other finding is identified. Mammogram images are processed with CAD. Targeted left breast ultrasound was performed by the physician in the palpable and painful area of concern. At 8 o'clock 4 cm from the nipple an anechoic mass measuring 2.2 x 1.3 x 2.5 cm is seen at the lumpectomy site. This corresponds to the circumscribed mass seen on the mammogram and is consistent with a benign seroma. IMPRESSION: Benign seroma at  the lumpectomy site in the left breast corresponding to the painful and palpable area  of concern. No evidence of malignancy. RECOMMENDATION: Recommend routine annual diagnostic mammogram in 1 year. Ultrasound-guided seroma drainage could be performed for symptomatic relief if the patient so desires. I have discussed the findings and recommendations with the patient. If applicable, a reminder letter will be sent to the patient regarding the next appointment. BI-RADS CATEGORY  2: Benign. Electronically Signed   By: Zerita Boers M.D.   On: 10/27/2018 16:23    Assessment & Plan:     Follow-up: No follow-ups on file.  Walker Kehr, MD

## 2019-03-23 NOTE — Assessment & Plan Note (Signed)
Ace wrap  follow-up with Dr. Georgina Snell

## 2019-03-23 NOTE — Assessment & Plan Note (Signed)
Obtain hemoglobin A1c 

## 2019-03-24 ENCOUNTER — Other Ambulatory Visit: Payer: Medicare Other

## 2019-03-26 ENCOUNTER — Other Ambulatory Visit: Payer: Medicare Other

## 2019-03-29 ENCOUNTER — Other Ambulatory Visit: Payer: Medicare Other

## 2019-03-31 ENCOUNTER — Other Ambulatory Visit: Payer: Medicare Other

## 2019-04-02 ENCOUNTER — Other Ambulatory Visit: Payer: Medicare Other

## 2019-04-05 ENCOUNTER — Other Ambulatory Visit: Payer: Medicare Other

## 2019-04-07 ENCOUNTER — Other Ambulatory Visit: Payer: Medicare Other

## 2019-04-09 ENCOUNTER — Ambulatory Visit: Payer: Medicare Other

## 2019-04-15 ENCOUNTER — Other Ambulatory Visit: Payer: Self-pay

## 2019-04-15 ENCOUNTER — Ambulatory Visit: Payer: Medicare Other | Attending: Internal Medicine

## 2019-04-15 ENCOUNTER — Other Ambulatory Visit (INDEPENDENT_AMBULATORY_CARE_PROVIDER_SITE_OTHER): Payer: Medicare Other

## 2019-04-15 DIAGNOSIS — Z23 Encounter for immunization: Secondary | ICD-10-CM

## 2019-04-15 DIAGNOSIS — R739 Hyperglycemia, unspecified: Secondary | ICD-10-CM

## 2019-04-15 DIAGNOSIS — E559 Vitamin D deficiency, unspecified: Secondary | ICD-10-CM

## 2019-04-15 LAB — HEPATIC FUNCTION PANEL
ALT: 13 U/L (ref 0–35)
AST: 22 U/L (ref 0–37)
Albumin: 4 g/dL (ref 3.5–5.2)
Alkaline Phosphatase: 60 U/L (ref 39–117)
Bilirubin, Direct: 0 mg/dL (ref 0.0–0.3)
Total Bilirubin: 0.4 mg/dL (ref 0.2–1.2)
Total Protein: 7.1 g/dL (ref 6.0–8.3)

## 2019-04-15 LAB — BASIC METABOLIC PANEL
BUN: 19 mg/dL (ref 6–23)
CO2: 28 mEq/L (ref 19–32)
Calcium: 10 mg/dL (ref 8.4–10.5)
Chloride: 105 mEq/L (ref 96–112)
Creatinine, Ser: 1.36 mg/dL — ABNORMAL HIGH (ref 0.40–1.20)
GFR: 37.41 mL/min — ABNORMAL LOW (ref >60.00)
Glucose, Bld: 165 mg/dL — ABNORMAL HIGH (ref 70–99)
Potassium: 4.4 mEq/L (ref 3.5–5.1)
Sodium: 139 mEq/L (ref 135–145)

## 2019-04-15 LAB — VITAMIN D 25 HYDROXY (VIT D DEFICIENCY, FRACTURES): VITD: 46.45 ng/mL (ref 30.00–100.00)

## 2019-04-15 LAB — HEMOGLOBIN A1C: Hgb A1c MFr Bld: 6.2 % (ref 4.6–6.5)

## 2019-04-15 NOTE — Progress Notes (Signed)
   Covid-19 Vaccination Clinic  Name:  [name] [name]    MRN: [id number] DOB: 15-Feb-1940  04/15/2019  [name] was observed post Covid-19 immunization for 15 minutes without incident. She was provided with Vaccine Information Sheet and instruction to access the V-Safe system.   [name] was instructed to call 911 with any severe reactions post vaccine: [name] Difficulty breathing  . Swelling of face and throat  . A fast heartbeat  . A bad rash all over body  . Dizziness and weakness   Immunizations Administered    Name Date Dose VIS Date Route   Pfizer COVID-19 Vaccine 04/15/2019  2:34 PM 0.3 mL 01/22/2019 Intramuscular   Manufacturer: [place]   Lot: [id number]   [place]: [id number]

## 2019-04-19 ENCOUNTER — Telehealth: Payer: Self-pay | Admitting: Internal Medicine

## 2019-04-19 NOTE — Telephone Encounter (Signed)
New message:   Pt is calling and states she would like to know her lab results. She states to please leave the high numbered things and what she needs to work on. She states to leave it on her answering machine if she is not there.

## 2019-04-19 NOTE — Telephone Encounter (Signed)
Pt.notified

## 2019-04-27 ENCOUNTER — Other Ambulatory Visit: Payer: Self-pay | Admitting: Internal Medicine

## 2019-05-07 ENCOUNTER — Other Ambulatory Visit: Payer: Self-pay | Admitting: Internal Medicine

## 2019-05-12 ENCOUNTER — Ambulatory Visit: Payer: Medicare Other | Attending: Internal Medicine

## 2019-05-12 DIAGNOSIS — Z23 Encounter for immunization: Secondary | ICD-10-CM

## 2019-05-12 NOTE — Progress Notes (Signed)
   Covid-19 Vaccination Clinic  Name:  [name] [name]    MRN: [id number] DOB: [birth year]  05/12/2019  [name] was observed post Covid-19 immunization for 15 minutes without incident. She was provided with Vaccine Information Sheet and instruction to access the V-Safe system.   [name] was instructed to call 911 with any severe reactions post vaccine: [name] Difficulty breathing  . Swelling of face and throat  . A fast heartbeat  . A bad rash all over body  . Dizziness and weakness   Immunizations Administered    Name Date Dose VIS Date Route   Pfizer COVID-19 Vaccine 05/12/2019  3:44 PM 0.3 mL 01/22/2019 Intramuscular   Manufacturer: [place]   Lot: [id number]   [place]: [id number]

## 2019-05-18 ENCOUNTER — Telehealth: Payer: Self-pay | Admitting: Oncology

## 2019-05-18 NOTE — Telephone Encounter (Signed)
Called pt per 4/6 sch message - no answer , left message for patient to call back to reschedule appt

## 2019-05-20 ENCOUNTER — Inpatient Hospital Stay: Payer: Medicare Other

## 2019-06-04 ENCOUNTER — Inpatient Hospital Stay: Payer: Medicare Other

## 2019-06-09 ENCOUNTER — Telehealth: Payer: Self-pay | Admitting: Emergency Medicine

## 2019-06-09 ENCOUNTER — Inpatient Hospital Stay: Payer: Medicare Other

## 2019-06-09 NOTE — Telephone Encounter (Signed)
Received call on triage line from pt who has lab/injection appts today at [facility].  Pt reports sore throat, body aches, and dry cough starting yesterday and wants to know if she can reschedule her appts for today.  Pt denies fever/chills, CP/SOB, or any other changes.  Pt denies being exposed to anyone who was sick recently, states she has had both Covid-19 shots.  Appts for today to be cancelled, contacted RN Val for MD Magrinat's office who will be sending scheduling message to have pt contacted regarding rescheduling of appts today (about 1-2 weeks out).  Pt verbalized understanding of plan, denies any further questions or concerns at this time.

## 2019-06-09 NOTE — Telephone Encounter (Signed)
This RN was notified of pt's call and discussion with Triage nurse.  This RN sent a scheduling request to reschedule appointment today

## 2019-06-16 ENCOUNTER — Inpatient Hospital Stay: Payer: Medicare Other

## 2019-06-17 ENCOUNTER — Inpatient Hospital Stay: Payer: Medicare Other

## 2019-06-21 ENCOUNTER — Inpatient Hospital Stay: Payer: Medicare Other

## 2019-06-22 ENCOUNTER — Ambulatory Visit: Payer: Medicare Other

## 2019-06-22 ENCOUNTER — Other Ambulatory Visit: Payer: Medicare Other

## 2019-06-24 ENCOUNTER — Inpatient Hospital Stay: Payer: Medicare Other

## 2019-06-28 ENCOUNTER — Inpatient Hospital Stay: Payer: Medicare Other | Attending: Oncology

## 2019-06-28 ENCOUNTER — Other Ambulatory Visit: Payer: Self-pay

## 2019-06-28 ENCOUNTER — Inpatient Hospital Stay: Payer: Medicare Other

## 2019-06-28 VITALS — BP 123/82 | HR 100 | Temp 98.5°F | Resp 20

## 2019-06-28 DIAGNOSIS — Z9221 Personal history of antineoplastic chemotherapy: Secondary | ICD-10-CM | POA: Diagnosis not present

## 2019-06-28 DIAGNOSIS — Z171 Estrogen receptor negative status [ER-]: Secondary | ICD-10-CM | POA: Insufficient documentation

## 2019-06-28 DIAGNOSIS — Z170 Estrogen receptor positive status [ER+]: Secondary | ICD-10-CM | POA: Insufficient documentation

## 2019-06-28 DIAGNOSIS — Z95828 Presence of other vascular implants and grafts: Secondary | ICD-10-CM

## 2019-06-28 DIAGNOSIS — M8580 Other specified disorders of bone density and structure, unspecified site: Secondary | ICD-10-CM | POA: Insufficient documentation

## 2019-06-28 DIAGNOSIS — C50012 Malignant neoplasm of nipple and areola, left female breast: Secondary | ICD-10-CM

## 2019-06-28 DIAGNOSIS — Z923 Personal history of irradiation: Secondary | ICD-10-CM | POA: Insufficient documentation

## 2019-06-28 DIAGNOSIS — C50412 Malignant neoplasm of upper-outer quadrant of left female breast: Secondary | ICD-10-CM | POA: Insufficient documentation

## 2019-06-28 DIAGNOSIS — Z853 Personal history of malignant neoplasm of breast: Secondary | ICD-10-CM | POA: Diagnosis not present

## 2019-06-28 LAB — CBC WITH DIFFERENTIAL/PLATELET
Abs Immature Granulocytes: 0.01 10*3/uL (ref 0.00–0.07)
Basophils Absolute: 0 10*3/uL (ref 0.0–0.1)
Basophils Relative: 0 %
Eosinophils Absolute: 0.2 10*3/uL (ref 0.0–0.5)
Eosinophils Relative: 4 %
HCT: 39.9 % (ref 36.0–46.0)
Hemoglobin: 13.1 g/dL (ref 12.0–15.0)
Immature Granulocytes: 0 %
Lymphocytes Relative: 34 %
Lymphs Abs: 2.2 10*3/uL (ref 0.7–4.0)
MCH: 31.6 pg (ref 26.0–34.0)
MCHC: 32.8 g/dL (ref 30.0–36.0)
MCV: 96.1 fL (ref 80.0–100.0)
Monocytes Absolute: 0.5 10*3/uL (ref 0.1–1.0)
Monocytes Relative: 7 %
Neutro Abs: 3.7 10*3/uL (ref 1.7–7.7)
Neutrophils Relative %: 55 %
Platelets: 262 10*3/uL (ref 150–400)
RBC: 4.15 MIL/uL (ref 3.87–5.11)
RDW: 13.5 % (ref 11.5–15.5)
WBC: 6.6 10*3/uL (ref 4.0–10.5)
nRBC: 0 % (ref 0.0–0.2)

## 2019-06-28 LAB — COMPREHENSIVE METABOLIC PANEL
ALT: 15 U/L (ref 0–44)
AST: 19 U/L (ref 15–41)
Albumin: 3.8 g/dL (ref 3.5–5.0)
Alkaline Phosphatase: 76 U/L (ref 38–126)
Anion gap: 11 (ref 5–15)
BUN: 21 mg/dL (ref 8–23)
CO2: 25 mmol/L (ref 22–32)
Calcium: 10 mg/dL (ref 8.9–10.3)
Chloride: 105 mmol/L (ref 98–111)
Creatinine, Ser: 1.45 mg/dL — ABNORMAL HIGH (ref 0.44–1.00)
GFR calc Af Amer: 39 mL/min — ABNORMAL LOW (ref >60)
GFR calc non Af Amer: 34 mL/min — ABNORMAL LOW (ref >60)
Glucose, Bld: 159 mg/dL — ABNORMAL HIGH (ref 70–99)
Potassium: 3.6 mmol/L (ref 3.5–5.1)
Sodium: 141 mmol/L (ref 135–145)
Total Bilirubin: 0.7 mg/dL (ref 0.3–1.2)
Total Protein: 7.2 g/dL (ref 6.5–8.1)

## 2019-06-28 MED ORDER — DENOSUMAB 60 MG/ML ~~LOC~~ SOSY
PREFILLED_SYRINGE | SUBCUTANEOUS | Status: AC
  Filled 2019-06-28: qty 1

## 2019-06-28 MED ORDER — DENOSUMAB 60 MG/ML ~~LOC~~ SOSY
60.0000 mg | PREFILLED_SYRINGE | Freq: Once | SUBCUTANEOUS | Status: AC
  Administered 2019-06-28: 60 mg via SUBCUTANEOUS

## 2019-06-28 NOTE — Patient Instructions (Signed)
Denosumab injection What is this medicine? DENOSUMAB (den oh sue mab) slows bone breakdown. Prolia is used to treat osteoporosis in women after menopause and in men, and in people who are taking corticosteroids for 6 months or more. Xgeva is used to treat a high calcium level due to cancer and to prevent bone fractures and other bone problems caused by multiple myeloma or cancer bone metastases. Xgeva is also used to treat giant cell tumor of the bone. This medicine may be used for other purposes; ask your health care provider or pharmacist if you have questions. COMMON BRAND NAME(S): Prolia, XGEVA What should I tell my health care provider before I take this medicine? They need to know if you have any of these conditions:  dental disease  having surgery or tooth extraction  infection  kidney disease  low levels of calcium or Vitamin D in the blood  malnutrition  on hemodialysis  skin conditions or sensitivity  thyroid or parathyroid disease  an unusual reaction to denosumab, other medicines, foods, dyes, or preservatives  pregnant or trying to get pregnant  breast-feeding How should I use this medicine? This medicine is for injection under the skin. It is given by a health care professional in a hospital or clinic setting. A special MedGuide will be given to you before each treatment. Be sure to read this information carefully each time. For Prolia, talk to your pediatrician regarding the use of this medicine in children. Special care may be needed. For Xgeva, talk to your pediatrician regarding the use of this medicine in children. While this drug may be prescribed for children as young as 13 years for selected conditions, precautions do apply. Overdosage: If you think you have taken too much of this medicine contact a poison control center or emergency room at once. NOTE: This medicine is only for you. Do not share this medicine with others. What if I miss a dose? It is  important not to miss your dose. Call your doctor or health care professional if you are unable to keep an appointment. What may interact with this medicine? Do not take this medicine with any of the following medications:  other medicines containing denosumab This medicine may also interact with the following medications:  medicines that lower your chance of fighting infection  steroid medicines like prednisone or cortisone This list may not describe all possible interactions. Give your health care provider a list of all the medicines, herbs, non-prescription drugs, or dietary supplements you use. Also tell them if you smoke, drink alcohol, or use illegal drugs. Some items may interact with your medicine. What should I watch for while using this medicine? Visit your doctor or health care professional for regular checks on your progress. Your doctor or health care professional may order blood tests and other tests to see how you are doing. Call your doctor or health care professional for advice if you get a fever, chills or sore throat, or other symptoms of a cold or flu. Do not treat yourself. This drug may decrease your body's ability to fight infection. Try to avoid being around people who are sick. You should make sure you get enough calcium and vitamin D while you are taking this medicine, unless your doctor tells you not to. Discuss the foods you eat and the vitamins you take with your health care professional. See your dentist regularly. Brush and floss your teeth as directed. Before you have any dental work done, tell your dentist you are   receiving this medicine. Do not become pregnant while taking this medicine or for 5 months after stopping it. Talk with your doctor or health care professional about your birth control options while taking this medicine. Women should inform their doctor if they wish to become pregnant or think they might be pregnant. There is a potential for serious side  effects to an unborn child. Talk to your health care professional or pharmacist for more information. What side effects may I notice from receiving this medicine? Side effects that you should report to your doctor or health care professional as soon as possible:  allergic reactions like skin rash, itching or hives, swelling of the face, lips, or tongue  bone pain  breathing problems  dizziness  jaw pain, especially after dental work  redness, blistering, peeling of the skin  signs and symptoms of infection like fever or chills; cough; sore throat; pain or trouble passing urine  signs of low calcium like fast heartbeat, muscle cramps or muscle pain; pain, tingling, numbness in the hands or feet; seizures  unusual bleeding or bruising  unusually weak or tired Side effects that usually do not require medical attention (report to your doctor or health care professional if they continue or are bothersome):  constipation  diarrhea  headache  joint pain  loss of appetite  muscle pain  runny nose  tiredness  upset stomach This list may not describe all possible side effects. Call your doctor for medical advice about side effects. You may report side effects to FDA at 1-800-FDA-1088. Where should I keep my medicine? This medicine is only given in a clinic, doctor's office, or other health care setting and will not be stored at home. NOTE: This sheet is a summary. It may not cover all possible information. If you have questions about this medicine, talk to your doctor, pharmacist, or health care provider.  2020 [employer] Standard (2017-06-06 16:10:44)

## 2019-07-20 ENCOUNTER — Encounter: Payer: Self-pay | Admitting: Internal Medicine

## 2019-07-20 ENCOUNTER — Ambulatory Visit (INDEPENDENT_AMBULATORY_CARE_PROVIDER_SITE_OTHER): Payer: Medicare Other | Admitting: Internal Medicine

## 2019-07-20 ENCOUNTER — Other Ambulatory Visit: Payer: Self-pay

## 2019-07-20 VITALS — BP 128/86 | HR 93 | Temp 97.9°F | Ht 63.0 in | Wt 148.6 lb

## 2019-07-20 DIAGNOSIS — N6002 Solitary cyst of left breast: Secondary | ICD-10-CM

## 2019-07-20 DIAGNOSIS — F419 Anxiety disorder, unspecified: Secondary | ICD-10-CM | POA: Diagnosis not present

## 2019-07-20 DIAGNOSIS — R270 Ataxia, unspecified: Secondary | ICD-10-CM | POA: Insufficient documentation

## 2019-07-20 DIAGNOSIS — N6489 Other specified disorders of breast: Secondary | ICD-10-CM | POA: Insufficient documentation

## 2019-07-20 DIAGNOSIS — E559 Vitamin D deficiency, unspecified: Secondary | ICD-10-CM

## 2019-07-20 DIAGNOSIS — I2510 Atherosclerotic heart disease of native coronary artery without angina pectoris: Secondary | ICD-10-CM

## 2019-07-20 DIAGNOSIS — E538 Deficiency of other specified B group vitamins: Secondary | ICD-10-CM

## 2019-07-20 DIAGNOSIS — F4321 Adjustment disorder with depressed mood: Secondary | ICD-10-CM

## 2019-07-20 DIAGNOSIS — R5382 Chronic fatigue, unspecified: Secondary | ICD-10-CM

## 2019-07-20 DIAGNOSIS — I10 Essential (primary) hypertension: Secondary | ICD-10-CM

## 2019-07-20 MED ORDER — ALPRAZOLAM 1 MG PO TABS: 0.5000 mg | ORAL_TABLET | Freq: Two times a day (BID) | ORAL | 3 refills | Status: DC | PRN

## 2019-07-20 MED ORDER — VENLAFAXINE HCL ER 75 MG PO CP24: 75.0000 mg | ORAL_CAPSULE | Freq: Every day | ORAL | 1 refills | Status: DC

## 2019-07-20 NOTE — Assessment & Plan Note (Signed)
On B12 

## 2019-07-20 NOTE — Assessment & Plan Note (Signed)
No change 

## 2019-07-20 NOTE — Assessment & Plan Note (Addendum)
D/c Cymbalta PT offered - declined Brain scan if not better

## 2019-07-20 NOTE — Assessment & Plan Note (Signed)
Xanax prn  Potential benefits of a long term benzodiazepines  use as well as potential risks  and complications were explained to the patient and were aknowledged. 

## 2019-07-20 NOTE — Assessment & Plan Note (Signed)
US guided aspiration ordered

## 2019-07-20 NOTE — Assessment & Plan Note (Signed)
On Vit D 

## 2019-07-20 NOTE — Progress Notes (Signed)
Subjective:  Patient ID: [name] [name], female    DOB: Jun 18, 1940  Age: 78 y.o. MRN: [id number]  CC: No chief complaint on file.   HPI [name] [name] presents for HTN, CAD, Vit D def, anxiety, B12 def f/u Sister is sick w/lymphoma, husband is ill (prostate cancer), hernias-- stress Feeling "drunk" at times x 6 months  Outpatient Medications Prior to Visit  Medication Sig Dispense Refill  . ALPRAZolam (XANAX) 1 MG tablet TAKE 0.5-1 TABLETS (0.5-1 MG TOTAL) BY MOUTH 2 (TWO) TIMES DAILY AS NEEDED FOR ANXIETY. 60 tablet 3  . anastrozole (ARIMIDEX) 1 MG tablet TAKE 1 TABLET BY MOUTH EVERY DAY 90 tablet 4  . cholecalciferol (VITAMIN D) 1000 units tablet Take 2 tablets (2,000 Units total) by mouth daily. 100 tablet 3  . Cyanocobalamin (VITAMIN B-12 IJ) Inject 1,000 mcg as directed every 30 (thirty) days.    . DULoxetine (CYMBALTA) 60 MG capsule TAKE 1 CAPSULE BY MOUTH EVERY DAY IN THE MORNING 90 capsule 3  . levothyroxine (SYNTHROID) 112 MCG tablet TAKE 1 TABLET (112 MCG TOTAL) BY MOUTH DAILY. 90 tablet 3  . loratadine (CLARITIN) 10 MG tablet Take 1 tablet (10 mg total) by mouth daily. 30 tablet 11  . meclizine (ANTIVERT) 12.5 MG tablet 1-2 tab by mouth every 8 hrs as needed 40 tablet 1  . rosuvastatin (CRESTOR) 20 MG tablet AS DIRECTED 90 tablet 3  . telmisartan (MICARDIS) 40 MG tablet Take 0.5 tablets (20 mg total) by mouth daily. 90 tablet 3  . triamcinolone (NASACORT) 55 MCG/ACT AERO nasal inhaler Place 2 sprays into the nose daily. 1 Inhaler 12  . triamcinolone cream (KENALOG) 0.5 % Apply 1 application topically 4 (four) times daily. 90 g 1  . venlafaxine XR (EFFEXOR-XR) 75 MG 24 hr capsule Take 1 capsule (75 mg total) daily with breakfast by mouth. 30 capsule 6  . vitamin B-12 (CYANOCOBALAMIN) 1000 MCG tablet Take 1,000 mcg by mouth every other day.     No facility-administered medications prior to visit.    ROS: Review of Systems  Constitutional: Negative for activity change,  appetite change, chills, fatigue and unexpected weight change.  HENT: Negative for congestion, mouth sores and sinus pressure.   Eyes: Negative for visual disturbance.  Respiratory: Negative for cough and chest tightness.   Gastrointestinal: Negative for abdominal pain and nausea.  Genitourinary: Negative for difficulty urinating, frequency and vaginal pain.  Musculoskeletal: Positive for arthralgias. Negative for back pain and gait problem.  Skin: Negative for pallor and rash.  Neurological: Negative for dizziness, tremors, weakness, numbness and headaches.  Psychiatric/Behavioral: Negative for confusion and sleep disturbance. The patient is nervous/anxious.     Objective:  There were no vitals taken for this visit.  BP Readings from Last 3 Encounters:  06/28/19 123/82  03/23/19 118/76  03/15/19 106/82    Wt Readings from Last 3 Encounters:  03/23/19 148 lb 6 oz (67.3 kg)  03/15/19 147 lb (66.7 kg)  03/15/19 147 lb 6.4 oz (66.9 kg)    Physical Exam Constitutional:      General: She is not in acute distress.    Appearance: She is well-developed.  HENT:     Head: Normocephalic.     Right Ear: External ear normal.     Left Ear: External ear normal.     Nose: Nose normal.  Eyes:     General:        Right eye: No discharge.        Left  eye: No discharge.     Conjunctiva/sclera: Conjunctivae normal.     Pupils: Pupils are equal, round, and reactive to light.  Neck:     Thyroid: No thyromegaly.     Vascular: No JVD.     Trachea: No tracheal deviation.  Cardiovascular:     Rate and Rhythm: Normal rate and regular rhythm.     Heart sounds: Normal heart sounds.  Pulmonary:     Effort: No respiratory distress.     Breath sounds: No stridor. No wheezing.  Abdominal:     General: Bowel sounds are normal. There is no distension.     Palpations: Abdomen is soft. There is no mass.     Tenderness: There is no abdominal tenderness. There is no guarding or rebound.    Musculoskeletal:        General: No tenderness.     Cervical back: Normal range of motion and neck supple.  Lymphadenopathy:     Cervical: No cervical adenopathy.  Skin:    Findings: No erythema or rash.  Neurological:     Mental Status: She is oriented to person, place, and time.     Cranial Nerves: No cranial nerve deficit.     Motor: No abnormal muscle tone.     Coordination: Coordination normal.     Deep Tendon Reflexes: Reflexes normal.  Psychiatric:        Behavior: Behavior normal.        Thought Content: Thought content normal.        Judgment: Judgment normal.    Ataxic   Lab Results  Component Value Date   WBC 6.6 06/28/2019   HGB 13.1 06/28/2019   HCT 39.9 06/28/2019   PLT 262 06/28/2019   GLUCOSE 159 (H) 06/28/2019   CHOL 205 (H) 06/05/2015   TRIG 257.0 (H) 06/05/2015   HDL 44.60 06/05/2015   LDLDIRECT 125.0 06/05/2015   LDLCALC 122 (H) 12/04/2007   ALT 15 06/28/2019   AST 19 06/28/2019   NA 141 06/28/2019   K 3.6 06/28/2019   CL 105 06/28/2019   CREATININE 1.45 (H) 06/28/2019   BUN 21 06/28/2019   CO2 25 06/28/2019   TSH 0.65 08/12/2018   INR 0.98 12/30/2016   HGBA1C 6.2 04/15/2019    US BREAST LTD UNI LEFT INC AXILLA  Result Date: 10/27/2018 CLINICAL DATA:  Here for evaluation of a palpable and painful area of concern in the left breast at the lumpectomy site which has not changed since prior exam. Patient underwent left breast lumpectomy in August 2018. Patient also has a history of a right mastectomy. EXAM: DIGITAL DIAGNOSTIC LEFT MAMMOGRAM WITH CAD AND TOMO ULTRASOUND LEFT BREAST COMPARISON:  Previous exam(s). ACR Breast Density Category b: There are scattered areas of fibroglandular density. FINDINGS: An oval circumscribed mass at the lumpectomy site is not significantly changed since prior exam. This measures 3.2 cm in greatest dimension. No suspicious mass, microcalcification, or other finding is identified. Mammogram images are processed with  CAD. Targeted left breast ultrasound was performed by the physician in the palpable and painful area of concern. At 8 o'clock 4 cm from the nipple an anechoic mass measuring 2.2 x 1.3 x 2.5 cm is seen at the lumpectomy site. This corresponds to the circumscribed mass seen on the mammogram and is consistent with a benign seroma. IMPRESSION: Benign seroma at the lumpectomy site in the left breast corresponding to the painful and palpable area of concern. No evidence of malignancy. RECOMMENDATION: Recommend routine annual  diagnostic mammogram in 1 year. Ultrasound-guided seroma drainage could be performed for symptomatic relief if the patient so desires. I have discussed the findings and recommendations with the patient. If applicable, a reminder letter will be sent to the patient regarding the next appointment. BI-RADS CATEGORY  2: Benign. Electronically Signed   By: Zerita Boers M.D.   On: 10/27/2018 16:23   MM DIAG BREAST TOMO UNI LEFT  Result Date: 10/27/2018 CLINICAL DATA:  Here for evaluation of a palpable and painful area of concern in the left breast at the lumpectomy site which has not changed since prior exam. Patient underwent left breast lumpectomy in August 2018. Patient also has a history of a right mastectomy. EXAM: DIGITAL DIAGNOSTIC LEFT MAMMOGRAM WITH CAD AND TOMO ULTRASOUND LEFT BREAST COMPARISON:  Previous exam(s). ACR Breast Density Category b: There are scattered areas of fibroglandular density. FINDINGS: An oval circumscribed mass at the lumpectomy site is not significantly changed since prior exam. This measures 3.2 cm in greatest dimension. No suspicious mass, microcalcification, or other finding is identified. Mammogram images are processed with CAD. Targeted left breast ultrasound was performed by the physician in the palpable and painful area of concern. At 8 o'clock 4 cm from the nipple an anechoic mass measuring 2.2 x 1.3 x 2.5 cm is seen at the lumpectomy site. This corresponds to  the circumscribed mass seen on the mammogram and is consistent with a benign seroma. IMPRESSION: Benign seroma at the lumpectomy site in the left breast corresponding to the painful and palpable area of concern. No evidence of malignancy. RECOMMENDATION: Recommend routine annual diagnostic mammogram in 1 year. Ultrasound-guided seroma drainage could be performed for symptomatic relief if the patient so desires. I have discussed the findings and recommendations with the patient. If applicable, a reminder letter will be sent to the patient regarding the next appointment. BI-RADS CATEGORY  2: Benign. Electronically Signed   By: Zerita Boers M.D.   On: 10/27/2018 16:23    Assessment & Plan:    Follow-up: No follow-ups on file.  Walker Kehr, MD

## 2019-07-20 NOTE — Assessment & Plan Note (Addendum)
Worse D/c Cymbalta Options discussed Re-start Effexor XR She can see Dr Toy Care

## 2019-07-20 NOTE — Assessment & Plan Note (Signed)
Losartan 

## 2019-07-20 NOTE — Assessment & Plan Note (Signed)
Crestor, BP control and ASA ?

## 2019-07-20 NOTE — Patient Instructions (Signed)
Stop Cymbalta

## 2019-07-21 ENCOUNTER — Other Ambulatory Visit: Payer: Self-pay | Admitting: Internal Medicine

## 2019-07-21 DIAGNOSIS — N6489 Other specified disorders of breast: Secondary | ICD-10-CM

## 2019-07-28 ENCOUNTER — Other Ambulatory Visit: Payer: Self-pay

## 2019-07-28 ENCOUNTER — Ambulatory Visit
Admission: RE | Admit: 2019-07-28 | Discharge: 2019-07-28 | Disposition: A | Discharge status: Home / Self Care | Payer: Medicare Other | Source: Ambulatory Visit | Attending: Internal Medicine | Admitting: Internal Medicine

## 2019-07-28 DIAGNOSIS — R928 Other abnormal and inconclusive findings on diagnostic imaging of breast: Secondary | ICD-10-CM | POA: Diagnosis not present

## 2019-07-28 DIAGNOSIS — N6489 Other specified disorders of breast: Secondary | ICD-10-CM

## 2019-07-28 DIAGNOSIS — N6324 Unspecified lump in the left breast, lower inner quadrant: Secondary | ICD-10-CM | POA: Diagnosis not present

## 2019-08-02 ENCOUNTER — Other Ambulatory Visit: Payer: Self-pay | Admitting: Internal Medicine

## 2019-08-31 ENCOUNTER — Other Ambulatory Visit: Payer: Self-pay | Admitting: Oncology

## 2019-10-01 ENCOUNTER — Other Ambulatory Visit: Payer: Self-pay | Admitting: Internal Medicine

## 2019-10-01 DIAGNOSIS — I10 Essential (primary) hypertension: Secondary | ICD-10-CM

## 2019-10-20 ENCOUNTER — Ambulatory Visit (INDEPENDENT_AMBULATORY_CARE_PROVIDER_SITE_OTHER): Payer: Medicare Other | Admitting: Internal Medicine

## 2019-10-20 ENCOUNTER — Encounter: Payer: Self-pay | Admitting: Internal Medicine

## 2019-10-20 ENCOUNTER — Other Ambulatory Visit: Payer: Self-pay

## 2019-10-20 VITALS — BP 112/76 | HR 115 | Temp 97.9°F | Ht 63.0 in | Wt 146.0 lb

## 2019-10-20 DIAGNOSIS — I10 Essential (primary) hypertension: Secondary | ICD-10-CM

## 2019-10-20 DIAGNOSIS — E538 Deficiency of other specified B group vitamins: Secondary | ICD-10-CM | POA: Diagnosis not present

## 2019-10-20 DIAGNOSIS — E785 Hyperlipidemia, unspecified: Secondary | ICD-10-CM

## 2019-10-20 DIAGNOSIS — F419 Anxiety disorder, unspecified: Secondary | ICD-10-CM

## 2019-10-20 DIAGNOSIS — E559 Vitamin D deficiency, unspecified: Secondary | ICD-10-CM

## 2019-10-20 DIAGNOSIS — F4321 Adjustment disorder with depressed mood: Secondary | ICD-10-CM

## 2019-10-20 DIAGNOSIS — R5382 Chronic fatigue, unspecified: Secondary | ICD-10-CM

## 2019-10-20 DIAGNOSIS — M25512 Pain in left shoulder: Secondary | ICD-10-CM

## 2019-10-20 NOTE — Assessment & Plan Note (Signed)
Vit D 

## 2019-10-20 NOTE — Assessment & Plan Note (Signed)
Worse. Effexor XR

## 2019-10-20 NOTE — Assessment & Plan Note (Signed)
R shoulder - use a rice sock for heat

## 2019-10-20 NOTE — Progress Notes (Signed)
Subjective:  Patient ID: [name] [name], female    DOB: March 16, 1940  Age: 79 y.o. MRN: [id number]  CC: No chief complaint on file.   HPI Jordanna T Twining presents for HTN, CAD, B12 def f/u C/o sadness  Outpatient Medications Prior to Visit  Medication Sig Dispense Refill  . ALPRAZolam (XANAX) 1 MG tablet Take 0.5-1 tablets (0.5-1 mg total) by mouth 2 (two) times daily as needed for anxiety. 60 tablet 3  . anastrozole (ARIMIDEX) 1 MG tablet TAKE 1 TABLET BY MOUTH EVERY DAY 90 tablet 4  . cholecalciferol (VITAMIN D) 1000 units tablet Take 2 tablets (2,000 Units total) by mouth daily. 100 tablet 3  . levothyroxine (SYNTHROID) 112 MCG tablet TAKE 1 TABLET (112 MCG TOTAL) BY MOUTH DAILY. 90 tablet 3  . loratadine (CLARITIN) 10 MG tablet TAKE 1 TABLET BY MOUTH EVERY DAY 90 tablet 3  . rosuvastatin (CRESTOR) 20 MG tablet AS DIRECTED 90 tablet 3  . telmisartan (MICARDIS) 40 MG tablet TAKE 1 TABLET BY MOUTH EVERY DAY 90 tablet 0  . triamcinolone (NASACORT) 55 MCG/ACT AERO nasal inhaler Place 2 sprays into the nose daily. 1 Inhaler 12  . venlafaxine XR (EFFEXOR-XR) 75 MG 24 hr capsule Take 1 capsule (75 mg total) by mouth daily with breakfast. 90 capsule 1  . vitamin B-12 (CYANOCOBALAMIN) 1000 MCG tablet Take 1,000 mcg by mouth every other day.    . zinc gluconate 50 MG tablet Take 50 mg by mouth daily.    . Cyanocobalamin (VITAMIN B-12 IJ) Inject 1,000 mcg as directed every 30 (thirty) days. (Patient not taking: Reported on 10/20/2019)    . meclizine (ANTIVERT) 12.5 MG tablet 1-2 tab by mouth every 8 hrs as needed (Patient not taking: Reported on 10/20/2019) 40 tablet 1   No facility-administered medications prior to visit.    ROS: Review of Systems  Constitutional: Negative for activity change, appetite change, chills, fatigue and unexpected weight change.  HENT: Negative for congestion, mouth sores and sinus pressure.   Eyes: Negative for visual disturbance.  Respiratory: Negative for cough  and chest tightness.   Gastrointestinal: Negative for abdominal pain and nausea.  Genitourinary: Negative for difficulty urinating, frequency and vaginal pain.  Musculoskeletal: Negative for back pain and gait problem.  Skin: Negative for pallor and rash.  Neurological: Negative for dizziness, tremors, weakness, numbness and headaches.  Psychiatric/Behavioral: Negative for confusion, sleep disturbance and suicidal ideas. The patient is nervous/anxious.     Objective:  BP 112/76 (BP Location: Right Arm, Patient Position: Sitting, Cuff Size: Normal)   Pulse (!) 115   Temp 97.9 F (36.6 C) (Oral)   Ht 5\' 3"  (1.6 m)   Wt 146 lb (66.2 kg)   SpO2 98%   BMI 25.86 kg/m   BP Readings from Last 3 Encounters:  10/20/19 112/76  07/20/19 128/86  06/28/19 123/82    Wt Readings from Last 3 Encounters:  10/20/19 146 lb (66.2 kg)  07/20/19 148 lb 9 oz (67.4 kg)  03/23/19 148 lb 6 oz (67.3 kg)    Physical Exam Constitutional:      General: She is not in acute distress.    Appearance: She is well-developed.  HENT:     Head: Normocephalic.     Right Ear: External ear normal.     Left Ear: External ear normal.     Nose: Nose normal.  Eyes:     General:        Right eye: No discharge.  Left eye: No discharge.     Conjunctiva/sclera: Conjunctivae normal.     Pupils: Pupils are equal, round, and reactive to light.  Neck:     Thyroid: No thyromegaly.     Vascular: No JVD.     Trachea: No tracheal deviation.  Cardiovascular:     Rate and Rhythm: Normal rate and regular rhythm.     Heart sounds: Normal heart sounds.  Pulmonary:     Effort: No respiratory distress.     Breath sounds: No stridor. No wheezing.  Abdominal:     General: Bowel sounds are normal. There is no distension.     Palpations: Abdomen is soft. There is no mass.     Tenderness: There is no abdominal tenderness. There is no guarding or rebound.  Musculoskeletal:        General: No tenderness.     Cervical  back: Normal range of motion and neck supple.  Lymphadenopathy:     Cervical: No cervical adenopathy.  Skin:    Findings: No erythema or rash.  Neurological:     Mental Status: She is oriented to person, place, and time.     Cranial Nerves: No cranial nerve deficit.     Motor: No abnormal muscle tone.     Coordination: Coordination normal.     Deep Tendon Reflexes: Reflexes normal.  Psychiatric:        Behavior: Behavior normal.        Thought Content: Thought content normal.        Judgment: Judgment normal.     Lab Results  Component Value Date   WBC 6.6 06/28/2019   HGB 13.1 06/28/2019   HCT 39.9 06/28/2019   PLT 262 06/28/2019   GLUCOSE 159 (H) 06/28/2019   CHOL 205 (H) 06/05/2015   TRIG 257.0 (H) 06/05/2015   HDL 44.60 06/05/2015   LDLDIRECT 125.0 06/05/2015   LDLCALC 122 (H) 12/04/2007   ALT 15 06/28/2019   AST 19 06/28/2019   NA 141 06/28/2019   K 3.6 06/28/2019   CL 105 06/28/2019   CREATININE 1.45 (H) 06/28/2019   BUN 21 06/28/2019   CO2 25 06/28/2019   TSH 0.65 08/12/2018   INR 0.98 12/30/2016   HGBA1C 6.2 04/15/2019    US BREAST LTD UNI LEFT INC AXILLA  Result Date: 07/28/2019 CLINICAL DATA:  79 year old female presenting for evaluation of a intermittently mildly tender palpable lump in the medial left breast. At the time of ultrasound she also indicated a second palpable area medial to the tender palpable lump. She has had a lumpectomy at this site in 2018, and there is a known seroma at the lumpectomy site. She has a history of a right mastectomy. EXAM: DIGITAL DIAGNOSTIC UNILATERAL LEFT MAMMOGRAM WITH TOMO AND CAD; ULTRASOUND LEFT BREAST LIMITED LEFT BREAST ULTRASOUND COMPARISON:  Previous exam(s). ACR Breast Density Category a: The breast tissue is almost entirely fatty. FINDINGS: A BB has been placed at the palpable site in the medial left breast. This is positioned near the patient's lumpectomy scar, and a seroma is seen deep to the palpable marker. This  is stable to slightly smaller compared to the exam in September of 2020. No suspicious calcifications, masses or areas of distortion are seen in the bilateral breasts. Mammographic images were processed with CAD. Physical exam of the left breast demonstrates a firm mass deep to the incision site in the lower-inner left breast, corresponding with the known seroma. A second palpable area is slightly more medial, the  feel of a fat lobule. Ultrasound targeted to the left breast at 8 o'clock, 4 cm from the nipple demonstrates that the previously seen seroma persists, today measuring 2.2 x 1.2 x 1.6 cm, previously 2.5 x 1.3 x 2.2 cm. Ultrasound targeted to the second palpable lump in the left breast at 8 o'clock, 7 cm from the nipple demonstrates normal fibroglandular tissue. No suspicious masses or areas of shadowing are identified. IMPRESSION: 1. The seroma in the lower-inner left breast has mildly decreased in size. Otherwise, the lumpectomy site is stable. 2. No suspicious mammographic or targeted sonographic abnormalities are identified at the second palpable site of concern in the medial left breast. 3.  No evidence of left breast malignancy. RECOMMENDATION: 1. I explained to the patient that typically seromas will take years to decrease in size, and may never completely resolve. We do not typically aspirate seromas and less they are significantly painful or infected. As the patient's symptoms are mild and intermittent, we agreed to defer aspiration today. 2.  Screening mammogram in one year.(Code:SM-B-01Y) I have discussed the findings and recommendations with the patient. If applicable, a reminder letter will be sent to the patient regarding the next appointment. BI-RADS CATEGORY  2: Benign. Electronically Signed   By: Ammie Ferrier M.D.   On: 07/28/2019 11:17   MM DIAG BREAST TOMO UNI LEFT  Result Date: 07/28/2019 CLINICAL DATA:  80 year old female presenting for evaluation of a intermittently mildly  tender palpable lump in the medial left breast. At the time of ultrasound she also indicated a second palpable area medial to the tender palpable lump. She has had a lumpectomy at this site in 2018, and there is a known seroma at the lumpectomy site. She has a history of a right mastectomy. EXAM: DIGITAL DIAGNOSTIC UNILATERAL LEFT MAMMOGRAM WITH TOMO AND CAD; ULTRASOUND LEFT BREAST LIMITED LEFT BREAST ULTRASOUND COMPARISON:  Previous exam(s). ACR Breast Density Category a: The breast tissue is almost entirely fatty. FINDINGS: A BB has been placed at the palpable site in the medial left breast. This is positioned near the patient's lumpectomy scar, and a seroma is seen deep to the palpable marker. This is stable to slightly smaller compared to the exam in September of 2020. No suspicious calcifications, masses or areas of distortion are seen in the bilateral breasts. Mammographic images were processed with CAD. Physical exam of the left breast demonstrates a firm mass deep to the incision site in the lower-inner left breast, corresponding with the known seroma. A second palpable area is slightly more medial, the feel of a fat lobule. Ultrasound targeted to the left breast at 8 o'clock, 4 cm from the nipple demonstrates that the previously seen seroma persists, today measuring 2.2 x 1.2 x 1.6 cm, previously 2.5 x 1.3 x 2.2 cm. Ultrasound targeted to the second palpable lump in the left breast at 8 o'clock, 7 cm from the nipple demonstrates normal fibroglandular tissue. No suspicious masses or areas of shadowing are identified. IMPRESSION: 1. The seroma in the lower-inner left breast has mildly decreased in size. Otherwise, the lumpectomy site is stable. 2. No suspicious mammographic or targeted sonographic abnormalities are identified at the second palpable site of concern in the medial left breast. 3.  No evidence of left breast malignancy. RECOMMENDATION: 1. I explained to the patient that typically seromas will  take years to decrease in size, and may never completely resolve. We do not typically aspirate seromas and less they are significantly painful or infected. As the patient's  symptoms are mild and intermittent, we agreed to defer aspiration today. 2.  Screening mammogram in one year.(Code:SM-B-01Y) I have discussed the findings and recommendations with the patient. If applicable, a reminder letter will be sent to the patient regarding the next appointment. BI-RADS CATEGORY  2: Benign. Electronically Signed   By: Ammie Ferrier M.D.   On: 07/28/2019 11:17    Assessment & Plan:    Walker Kehr, MD

## 2019-10-20 NOTE — Assessment & Plan Note (Signed)
Vit B12 

## 2019-10-20 NOTE — Patient Instructions (Signed)
use a rice sock for heat

## 2019-10-20 NOTE — Addendum Note (Signed)
Addended by: Cresenciano Lick on: 10/20/2019 04:34 PM   Modules accepted: Orders

## 2019-10-20 NOTE — Assessment & Plan Note (Signed)
Chronic Risks associated with treatment noncompliance were discussed. Compliance was encouraged. Better BP at home Losartan

## 2019-10-20 NOTE — Assessment & Plan Note (Signed)
Doing OK 

## 2019-10-20 NOTE — Assessment & Plan Note (Signed)
Xanax prn  Potential benefits of a long term benzodiazepines  use as well as potential risks  and complications were explained to the patient and were aknowledged. 

## 2019-10-21 LAB — BASIC METABOLIC PANEL WITH GFR
BUN/Creatinine Ratio: 11 (calc) (ref 6–22)
BUN: 19 mg/dL (ref 7–25)
CO2: 24 mmol/L (ref 20–32)
Calcium: 10.9 mg/dL — ABNORMAL HIGH (ref 8.6–10.4)
Chloride: 105 mmol/L (ref 98–110)
Creat: 1.74 mg/dL — ABNORMAL HIGH (ref 0.60–0.88)
GFR, Est African American: 32 mL/min/{1.73_m2} — ABNORMAL LOW (ref >=60)
GFR, Est Non African American: 27 mL/min/{1.73_m2} — ABNORMAL LOW (ref >=60)
Glucose, Bld: 111 mg/dL — ABNORMAL HIGH (ref 65–99)
Potassium: 4.7 mmol/L (ref 3.5–5.3)
Sodium: 141 mmol/L (ref 135–146)

## 2019-10-21 LAB — LIPID PANEL
Cholesterol: 184 mg/dL (ref <200)
HDL: 45 mg/dL — ABNORMAL LOW (ref >=50)
Non-HDL Cholesterol (Calc): 139 mg/dL (calc) — ABNORMAL HIGH (ref <130)
Total CHOL/HDL Ratio: 4.1 (calc) (ref <5.0)
Triglycerides: 420 mg/dL — ABNORMAL HIGH (ref <150)

## 2019-10-21 LAB — TSH: TSH: 0.56 mIU/L (ref 0.40–4.50)

## 2019-10-25 ENCOUNTER — Other Ambulatory Visit: Payer: Self-pay | Admitting: Internal Medicine

## 2019-10-25 DIAGNOSIS — R5382 Chronic fatigue, unspecified: Secondary | ICD-10-CM

## 2019-10-25 DIAGNOSIS — E785 Hyperlipidemia, unspecified: Secondary | ICD-10-CM

## 2019-10-25 DIAGNOSIS — R739 Hyperglycemia, unspecified: Secondary | ICD-10-CM

## 2019-11-03 ENCOUNTER — Telehealth: Payer: Self-pay | Admitting: Internal Medicine

## 2019-11-03 NOTE — Telephone Encounter (Signed)
    Patient requesting most recent lab results be mailed to her address on file

## 2019-11-04 NOTE — Telephone Encounter (Signed)
Mailed out to patient today 

## 2019-11-19 ENCOUNTER — Telehealth: Payer: Self-pay | Admitting: Oncology

## 2019-11-19 NOTE — Telephone Encounter (Signed)
Per sch msg, r/s 10/11 appt per patient request. Called and spoke with patient. Confirmed new appt

## 2019-11-22 ENCOUNTER — Inpatient Hospital Stay: Payer: Medicare Other

## 2019-11-22 ENCOUNTER — Inpatient Hospital Stay: Payer: Medicare Other | Admitting: Oncology

## 2019-12-05 NOTE — Progress Notes (Signed)
. IDKELLIE [name]   DOB: 1941-02-04  MR#: [id number]  CSN#:[id number]  Patient Care Team: [name] Anger, MD as PCP - General Magrinat, [name], MD as Consulting Physician (Oncology) [name] [name], MD as Consulting Physician (General Surgery) [name] Campbell, MD as Consulting Physician (Gastroenterology) [name] Rudd, MD as Consulting Physician (Radiation Oncology)   CHIEF COMPLAINT: Weakly estrogen receptor positive breast cancer (s/p right mastectomy)  CURRENT TREATMENT: Anastrozole, Prolia   INTERVAL HISTORY: [name] [name] did not show to her 12/06/2019 visit for follow-up of her estrogen receptor weakly positive breast cancer  [name] [name]'s last bone density screening on 05/06/2017, showed a T-score of -3.4, which is considered osteoporotic.  She is on denosumab/Prolia, with her most recent treatment 06/28/2019.  She is scheduled for dose today.  Since her last visit, she presented to her PCP with a mildly tender, palpable lump in her medial left breast. She underwent left diagnostic mammography with tomography and left breast ultrasonography at [facility] on 07/28/2019 showing: breast density category A; mild decrease in size of known lower-inner left breast seroma; no suspicious abnormalities identified at palpable site of concern in medial left breast; no evidence of malignancy.    REVIEW OF SYSTEMS: [name] [name]    BREAST CANCER HISTORY: From the recent summary note:  I last saw [name] [name] in 2013 when she was released from follow-up from her right breast cancer recurrence in 2004. She is status post right mastectomy and adjuvant chemotherapy for that triple negative tumor. More recently, on 07/16/2016 she underwent left screening mammography at the [facility] on 07/16/2016. This found the breast density to be category B. There was a possible asymmetry in the left breast, and on 07/19/2016 she underwent left diagnostic mammography with tomography and left breast ultrasonography. This  confirmed a well circumscribed oval mass at the 9:00 position of the left breast measuring approximately 0.8 cm. This was not palpable. On ultrasonography the mass measured 0.6 cm and it was located at the 9:00 radiant 4 cm from the nipple. The left axilla was sonographically benign.  On June 11 [name] [name] underwent biopsy of the left breast mass in question and this showed ([phone]) and invasive ductal carcinoma, grade 3, estrogen receptor 5% positive with weak staining intensity, progesterone receptor negative, with an MIB-1 of 80%, and HER-2 nonamplified, the signals ratio being 1.46 and the number per cell 1.90.  Her subsequent history is as detailed below.   PAST MEDICAL HISTORY: Past Medical History:  Diagnosis Date  . Allergic rhinitis   . Anxiety   . Breast cancer ([facility]) hx 2004   recurrent 2006 [name] Magrinat  . Family history of breast cancer   . 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 HTN (hypertension)   . Hyperlipidemia   . Hypothyroidism   . Personal history of chemotherapy 2002  . Personal history of radiation therapy 2002  . Psoriasis   . S/P thyroidectomy 07/2005   2 cm largest diameter (1 other tiny focus)/ i-131 rx 99 [facility] 08/2005  . Thyroid cancer ([employer])    Papillary Stage 1 - [name] [name]  . Vitamin B12 deficiency 2009  . Vitamin D deficiency 2009    PAST SURGICAL HISTORY: Past Surgical History:  Procedure Laterality Date  . BREAST LUMPECTOMY    . BREAST LUMPECTOMY WITH RADIOACTIVE SEED AND SENTINEL LYMPH NODE BIOPSY Left 09/11/2016   Procedure: LEFT BREAST LUMPECTOMY WITH RADIOACTIVE SEED AND LEFT AXILLARY SENTINEL LYMPH  NODE BIOPSY WITH BLUE DYE INJECTION;  Surgeon: [name] [name], MD;  Location: [facility];  Service: General;  Laterality: Left;  . IR FLUORO GUIDE PORT INSERTION RIGHT  09/13/2016  . IR REMOVAL TUN ACCESS W/ PORT W/O FL MOD SED  12/30/2016  . IR US GUIDE VASC ACCESS  RIGHT  09/13/2016  . MASTECTOMY     Right  . PORTACATH PLACEMENT N/A 09/11/2016   Procedure: ATTEMPTED INSERTION PORT-A-CATH WITH ULTRA SOUND GUIDANCE;  Surgeon: [name] [name], MD;  Location: [facility];  Service: General;  Laterality: N/A;  . REDUCTION MAMMAPLASTY Left 2005  . THYROIDECTOMY  2007    FAMILY HISTORY Family History  Problem Relation Age of Onset  . Stroke Mother   . Allergies Mother   . Asthma Mother   . Clotting disorder Mother   . Heart disease Father 58       MI  . Allergies Sister   . Asthma Sister   . Breast cancer Sister 32       Lymphoma 15; currently 41  . Lymphoma Sister   . Asthma Brother   . Leukemia Brother        dx 13s  . Allergies Daughter   . Asthma Daughter   . Allergies Sister   . Breast cancer Sister 6       currently 45  . Kidney cancer Paternal Aunt        kidney ca; deceased 60  . Liver cancer Paternal Uncle        unk. primary ("liver")  . Throat cancer Maternal Grandmother        deceased 33  . Lung cancer Maternal Grandfather        deceased 84  . Pancreatic cancer Paternal Grandfather        deceased 33  . Cancer Paternal Aunt        "abdominal"; deceased 37  Family history includes a sister, a paternal aunt, and a paternal cousin with breast cancer all older then 26 at diagnosis   GYN HISTORY: Menarche age 66, first live birth age 81. She is GX P3. She went through the change of life age 18. She did not take hormone replacement.   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 Her husband [name] [name] is retired from [employer]. Her children are [name] [name], who works for time [employer] in [place], and [place], who lives in [place] and works for [employer]. The third child, [name] [name], died in an automobile accident at age 24. The patient has one granddaughter. Her dog Baxter Hire, just passed away at 75 years old. 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    HEALTH MAINTENANCE: Social History   Tobacco Use  . Smoking status: Never Smoker  . Smokeless tobacco: Never Used  Substance Use Topics  . Alcohol use: No  . Drug use: No     Colonoscopy:  PAP:  Bone density: 05/06/2017 showed a T score of -3.4 osteoporosis  Lipid panel:  Allergies  Allergen Reactions  . Pneumococcal Vaccines Other (See Comments)    Was really sick   . Sulfa Antibiotics Other (See Comments)    Pt believes it was hives  . Sulfacetamide Sodium-Sulfur   . Tape Itching, Dermatitis and Rash  . Tegaderm Ag Mesh [Silver] Itching and Rash  Current Outpatient Medications  Medication Sig Dispense Refill  . ALPRAZolam (XANAX) 1 MG tablet Take 0.5-1 tablets (0.5-1 mg total) by mouth 2 (two) times daily as needed for anxiety. 60 tablet 3  . anastrozole (ARIMIDEX) 1 MG tablet TAKE 1 TABLET BY MOUTH EVERY DAY 90 tablet 4  . cholecalciferol (VITAMIN D) 1000 units tablet Take 2 tablets (2,000 Units total) by mouth daily. 100 tablet 3  . Cyanocobalamin (VITAMIN B-12 IJ) Inject 1,000 mcg as directed every 30 (thirty) days. (Patient not taking: Reported on 10/20/2019)    . levothyroxine (SYNTHROID) 112 MCG tablet TAKE 1 TABLET (112 MCG TOTAL) BY MOUTH DAILY. 90 tablet 3  . loratadine (CLARITIN) 10 MG tablet TAKE 1 TABLET BY MOUTH EVERY DAY 90 tablet 3  . meclizine (ANTIVERT) 12.5 MG tablet 1-2 tab by mouth every 8 hrs as needed (Patient not taking: Reported on 10/20/2019) 40 tablet 1  . rosuvastatin (CRESTOR) 20 MG tablet AS DIRECTED 90 tablet 3  . telmisartan (MICARDIS) 40 MG tablet TAKE 1 TABLET BY MOUTH EVERY DAY 90 tablet 0  . triamcinolone (NASACORT) 55 MCG/ACT AERO nasal inhaler Place 2 sprays into the nose daily. 1 Inhaler 12  . venlafaxine XR (EFFEXOR-XR) 75 MG 24 hr capsule Take 1 capsule (75 mg total) by mouth daily with breakfast. 90 capsule 1  . vitamin B-12  (CYANOCOBALAMIN) 1000 MCG tablet Take 1,000 mcg by mouth every other day.    . zinc gluconate 50 MG tablet Take 50 mg by mouth daily.     No current facility-administered medications for this visit.    OBJECTIVE:   There were no vitals filed for this visit.   There is no height or weight on file to calculate BMI.    ECOG FS:    LAB RESULTS: Lab Results  Component Value Date   WBC 6.6 06/28/2019   NEUTROABS 3.7 06/28/2019   HGB 13.1 06/28/2019   HCT 39.9 06/28/2019   MCV 96.1 06/28/2019   PLT 262 06/28/2019      Chemistry      Component Value Date/Time   NA 141 10/20/2019 1634   NA 139 12/11/2016 1017   K 4.7 10/20/2019 1634   K 3.7 12/11/2016 1017   CL 105 10/20/2019 1634   CL 104 10/09/2011 1403   CO2 24 10/20/2019 1634   CO2 22 12/11/2016 1017   BUN 19 10/20/2019 1634   BUN 16.9 12/11/2016 1017   CREATININE 1.74 (H) 10/20/2019 1634   CREATININE 1.2 (H) 12/11/2016 1017      Component Value Date/Time   CALCIUM 10.9 (H) 10/20/2019 1634   CALCIUM 9.0 12/11/2016 1017   ALKPHOS 76 06/28/2019 1310   ALKPHOS 139 12/11/2016 1017   AST 19 06/28/2019 1310   AST 65 (H) 12/11/2016 1017   ALT 15 06/28/2019 1310   ALT 51 12/11/2016 1017   BILITOT 0.7 06/28/2019 1310   BILITOT 0.42 12/11/2016 1017       Lab Results  Component Value Date   LABCA2 16 10/09/2011    No components found for: [id number]  No results for input(s): INR in the last 168 hours.  Urinalysis    Component Value Date/Time   COLORURINE YELLOW 06/05/2015 1559   APPEARANCEUR CLEAR 06/05/2015 1559   LABSPEC 1.020 06/05/2015 1559   PHURINE 5.5 06/05/2015 1559   GLUCOSEU NEGATIVE 06/05/2015 1559   HGBUR NEGATIVE 06/05/2015 1559   BILIRUBINUR NEGATIVE 06/05/2015 1559   KETONESUR NEGATIVE 06/05/2015 1559   UROBILINOGEN 0.2 06/05/2015 1559  NITRITE NEGATIVE 06/05/2015 1559   LEUKOCYTESUR MODERATE (A) 06/05/2015 1559    STUDIES: No results found.   ASSESSMENT: 79 y.o. [place] woman   (1)  status post right lumpectomy and sentinel lymph node biopsy in September 2002 for multifocal breast carcinoma, triple negative.  Treated with CMF followed by radiation.   (2) Local recurrence in April 2004, status post right modified radical mastectomy with TRAM reconstruction for what proved to be a T1c N0, stage IA triple negative breast carcinoma.  Treated adjuvantly with paclitaxel and doxorubicin x4 in 2004.  Off treatment since August 2004 with no evidence of recurrence  (3) history of papillary thyroid cancer s/p thyroidectomy June 2007, s/p radioactive iodine July 2007  (4) status post left breast upper outer quadrant biopsy 08/21/2016 for a clinical T1b N0, stage IB invasive ductal carcinoma, grade 3, weakly estrogen receptor positive, progesterone receptor and HER-2 negative, with an MIB-1 of 80%.  (5) left lumpectomy and sentinel lymph node sampling 09/11/2016 found a pT1b pN0, stage IB invasive ductal carcinoma, grade 3, with negative margins.  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*) (a) there were no deleterious mutations in ALK, APC, ATM, AXIN2, BAP1, BARD1, BLM, BMPR1A, BRCA1, BRCA2, BRIP1, CASR, CDC73, CDH1, CDK4, CDKN1B, CDKN1C, CDKN2A, CEBPA, CHEK2, CTNNA1, DICER1, DIS3L2, EGFR, EPCAM, FH, FLCN, GATA2, GPC3, GREM1, HOXB13, HRAS, KIT, MAX, MEN1, MET, MITF, MLH1, MSH2, MSH3, MSH6, MUTYH, NBN, NF1, NF2, NTHL1, PALB2, PDGFRA, PHOX2B, PMS2, POLD1, POLE, POT1, PRKAR1A, PTCH1, PTEN, RAD50, RAD51C, RAD51D, RB1, RECQL4,  RET, RUNX1, SDHA, SDHAF2, SDHB, SDHC, SDHD, SMAD4, SMARCA4, SMARCB1, SMARCE1, STK11, SUFU, TERC, TERT, TMEM127, TP53, TSC1, TSC2, VHL, WRN, WT1 (b) while homozygous mutations in the NPH L1 gene are associated with autosomal recessive polyposis, there is no evidence to suggest that a single mutation in this gene increases cancer risk.  (9) started anastrozole 03/14/2017 (a) bone density on 05/06/2017 showed a T score of -3.4--osteoporosis (b) started denosumab/Prolia June 2019, to be repeated every 6 months   PLAN:  PeCassadyid not show to her 12/06/2019 visit.  A follow-up letter has been sent.    GuVirgie DadMagrinat, MD 12/05/19 12:02 PM Medical Oncology and Hematology Co[id number]. [phone]  Fax. [phone] I, KaWilburn Mylaram acting as scribe for [name]. GuVirgie DadMagrinat.     *Total Encounter Time as defined by the Centers for Medicare and Medicaid Services includes, in addition to the face-to-face time of a patient visit (documented in the note above) non-face-to-face time: obtaining and reviewing outside history, ordering and reviewing medications, tests or procedures, care coordination (communications with other health care professionals or caregivers) and documentation in the medical record.

## 2019-12-06 ENCOUNTER — Telehealth: Payer: Self-pay | Admitting: Oncology

## 2019-12-06 ENCOUNTER — Inpatient Hospital Stay: Payer: Medicare Other

## 2019-12-06 ENCOUNTER — Inpatient Hospital Stay (HOSPITAL_BASED_OUTPATIENT_CLINIC_OR_DEPARTMENT_OTHER): Payer: Medicare Other | Admitting: Oncology

## 2019-12-06 ENCOUNTER — Inpatient Hospital Stay: Payer: Medicare Other | Attending: Oncology

## 2019-12-06 DIAGNOSIS — Z79811 Long term (current) use of aromatase inhibitors: Secondary | ICD-10-CM

## 2019-12-06 DIAGNOSIS — Z170 Estrogen receptor positive status [ER+]: Secondary | ICD-10-CM

## 2019-12-06 DIAGNOSIS — C50412 Malignant neoplasm of upper-outer quadrant of left female breast: Secondary | ICD-10-CM

## 2019-12-06 NOTE — Telephone Encounter (Signed)
Called patient regarding 10/21 los, patient has been called and voicemail has been left.

## 2019-12-11 ENCOUNTER — Ambulatory Visit: Payer: Medicare Other

## 2019-12-16 ENCOUNTER — Telehealth: Payer: Self-pay | Admitting: Oncology

## 2019-12-16 NOTE — Telephone Encounter (Signed)
Rescheduled per patient, called and spoke with pt, confirmed 12/16 appts

## 2019-12-24 ENCOUNTER — Other Ambulatory Visit: Payer: Self-pay | Admitting: Internal Medicine

## 2019-12-24 DIAGNOSIS — I10 Essential (primary) hypertension: Secondary | ICD-10-CM

## 2019-12-25 ENCOUNTER — Other Ambulatory Visit: Payer: Self-pay

## 2019-12-25 ENCOUNTER — Ambulatory Visit: Payer: Medicare Other | Attending: Internal Medicine

## 2019-12-25 DIAGNOSIS — Z23 Encounter for immunization: Secondary | ICD-10-CM

## 2019-12-25 NOTE — Progress Notes (Signed)
   Covid-19 Vaccination Clinic  Name:  [name] [name]    MRN: [id number] DOB: [birth year]  12/25/2019  [name] [name] was observed post Covid-19 immunization for 15 minutes without incident. She was provided with Vaccine Information Sheet and instruction to access the V-Safe system.   [name] [name] was instructed to call 911 with any severe reactions post vaccine: [name] Difficulty breathing  . Swelling of face and throat  . A fast heartbeat  . A bad rash all over body  . Dizziness and weakness   Immunizations Administered    Name Date Dose VIS Date Route   Pfizer COVID-19 Vaccine 12/25/2019  1:51 PM 0.3 mL 12/01/2019 Intramuscular   Manufacturer: [employer]   Lot: [id number]   [place]: [id number]

## 2019-12-27 ENCOUNTER — Other Ambulatory Visit: Payer: Self-pay | Admitting: Internal Medicine

## 2020-01-12 ENCOUNTER — Other Ambulatory Visit: Payer: Self-pay | Admitting: Internal Medicine

## 2020-01-18 ENCOUNTER — Other Ambulatory Visit: Payer: Self-pay

## 2020-01-19 ENCOUNTER — Ambulatory Visit (INDEPENDENT_AMBULATORY_CARE_PROVIDER_SITE_OTHER): Payer: Medicare Other | Admitting: Internal Medicine

## 2020-01-19 ENCOUNTER — Encounter: Payer: Self-pay | Admitting: Internal Medicine

## 2020-01-19 DIAGNOSIS — F419 Anxiety disorder, unspecified: Secondary | ICD-10-CM

## 2020-01-19 DIAGNOSIS — F4321 Adjustment disorder with depressed mood: Secondary | ICD-10-CM

## 2020-01-19 DIAGNOSIS — E538 Deficiency of other specified B group vitamins: Secondary | ICD-10-CM

## 2020-01-19 DIAGNOSIS — I10 Essential (primary) hypertension: Secondary | ICD-10-CM

## 2020-01-19 DIAGNOSIS — E559 Vitamin D deficiency, unspecified: Secondary | ICD-10-CM | POA: Diagnosis not present

## 2020-01-19 MED ORDER — VENLAFAXINE HCL ER 150 MG PO CP24: 150.0000 mg | ORAL_CAPSULE | Freq: Every day | ORAL | 5 refills | Status: DC

## 2020-01-19 MED ORDER — CYANOCOBALAMIN 1000 MCG/ML IJ SOLN
1000.0000 ug | Freq: Once | INTRAMUSCULAR | Status: AC
  Administered 2020-01-19: 1000 ug via INTRAMUSCULAR

## 2020-01-19 NOTE — Patient Instructions (Signed)
   B-complex with Niacin 100 mg    Lion's mane  

## 2020-01-19 NOTE — Assessment & Plan Note (Signed)
On B12 

## 2020-01-19 NOTE — Assessment & Plan Note (Signed)
Worse EFFEXOR - INCREASE TO 150 MG/D

## 2020-01-19 NOTE — Assessment & Plan Note (Signed)
Xanax prn  Potential benefits of a long term benzodiazepines  use as well as potential risks  and complications were explained to the patient and were aknowledged. 

## 2020-01-19 NOTE — Assessment & Plan Note (Signed)
Vit D 

## 2020-01-19 NOTE — Assessment & Plan Note (Signed)
Risks associated with treatment noncompliance were discussed. Compliance was encouraged. Better BP at home Losartan

## 2020-01-19 NOTE — Progress Notes (Signed)
Subjective:  Patient ID: [name] [name], female    DOB: [birth year]  Age: 79 y.o. MRN: [id number]  CC: Follow-up (3 month f/u)   HPI [name] [name] presents for R shoulder pain x weeks; depression - worse F/u hypothyroidism  Outpatient Medications Prior to Visit  Medication Sig Dispense Refill  . ALPRAZolam (XANAX) 1 MG tablet Take 0.5-1 tablets (0.5-1 mg total) by mouth 2 (two) times daily as needed for anxiety. 60 tablet 3  . anastrozole (ARIMIDEX) 1 MG tablet TAKE 1 TABLET BY MOUTH EVERY DAY 90 tablet 4  . cholecalciferol (VITAMIN D) 1000 units tablet Take 2 tablets (2,000 Units total) by mouth daily. 100 tablet 3  . levothyroxine (SYNTHROID) 112 MCG tablet TAKE 1 TABLET (112 MCG TOTAL) BY MOUTH DAILY. 90 tablet 2  . telmisartan (MICARDIS) 40 MG tablet TAKE 1 TABLET BY MOUTH EVERY DAY 90 tablet 0  . vitamin B-12 (CYANOCOBALAMIN) 1000 MCG tablet Take 1,000 mcg by mouth every other day.    . zinc gluconate 50 MG tablet Take 50 mg by mouth daily.    . meclizine (ANTIVERT) 12.5 MG tablet 1-2 tab by mouth every 8 hrs as needed (Patient not taking: Reported on 10/20/2019) 40 tablet 1  . Cyanocobalamin (VITAMIN B-12 IJ) Inject 1,000 mcg as directed every 30 (thirty) days. (Patient not taking: Reported on 10/20/2019)    . loratadine (CLARITIN) 10 MG tablet TAKE 1 TABLET BY MOUTH EVERY DAY (Patient not taking: Reported on 01/19/2020) 90 tablet 3  . rosuvastatin (CRESTOR) 20 MG tablet AS DIRECTED (Patient not taking: Reported on 01/19/2020) 90 tablet 3  . triamcinolone (NASACORT) 55 MCG/ACT AERO nasal inhaler Place 2 sprays into the nose daily. (Patient not taking: Reported on 01/19/2020) 1 Inhaler 12  . venlafaxine XR (EFFEXOR-XR) 75 MG 24 hr capsule TAKE 1 CAPSULE (75 MG TOTAL) BY MOUTH DAILY WITH BREAKFAST. (Patient not taking: Reported on 01/19/2020) 90 capsule 1   No facility-administered medications prior to visit.    ROS: Review of Systems  Constitutional: Negative for activity change,  appetite change, chills, fatigue and unexpected weight change.  HENT: Negative for congestion, mouth sores and sinus pressure.   Eyes: Negative for visual disturbance.  Respiratory: Negative for cough and chest tightness.   Gastrointestinal: Negative for abdominal pain and nausea.  Genitourinary: Negative for difficulty urinating, frequency and vaginal pain.  Musculoskeletal: Positive for gait problem. Negative for back pain.  Skin: Negative for pallor and rash.  Neurological: Negative for dizziness, tremors, weakness, numbness and headaches.  Psychiatric/Behavioral: Negative for confusion, sleep disturbance and suicidal ideas.    Objective:  BP 112/78 (BP Location: Left Arm)   Pulse (!) 110   Temp 97.9 F (36.6 C) (Oral)   Ht 5\' 3"  (1.6 m)   Wt 150 lb (68 kg)   SpO2 97%   BMI 26.57 kg/m   BP Readings from Last 3 Encounters:  01/19/20 112/78  10/20/19 112/76  07/20/19 128/86    Wt Readings from Last 3 Encounters:  01/19/20 150 lb (68 kg)  10/20/19 146 lb (66.2 kg)  07/20/19 148 lb 9 oz (67.4 kg)    Physical Exam Constitutional:      General: She is not in acute distress.    Appearance: She is well-developed.  HENT:     Head: Normocephalic.     Right Ear: External ear normal.     Left Ear: External ear normal.     Nose: Nose normal.  Eyes:     General:  Right eye: No discharge.        Left eye: No discharge.     Conjunctiva/sclera: Conjunctivae normal.     Pupils: Pupils are equal, round, and reactive to light.  Neck:     Thyroid: No thyromegaly.     Vascular: No JVD.     Trachea: No tracheal deviation.  Cardiovascular:     Rate and Rhythm: Normal rate and regular rhythm.     Heart sounds: Normal heart sounds.  Pulmonary:     Effort: No respiratory distress.     Breath sounds: No stridor. No wheezing.  Abdominal:     General: Bowel sounds are normal. There is no distension.     Palpations: Abdomen is soft. There is no mass.     Tenderness: There is  no abdominal tenderness. There is no guarding or rebound.  Musculoskeletal:        General: No tenderness.     Cervical back: Normal range of motion and neck supple.  Lymphadenopathy:     Cervical: No cervical adenopathy.  Skin:    Findings: No erythema or rash.  Neurological:     Cranial Nerves: No cranial nerve deficit.     Motor: No abnormal muscle tone.     Coordination: Coordination normal.     Deep Tendon Reflexes: Reflexes normal.  Psychiatric:        Behavior: Behavior normal.        Thought Content: Thought content normal.        Judgment: Judgment normal.     Lab Results  Component Value Date   WBC 6.6 06/28/2019   HGB 13.1 06/28/2019   HCT 39.9 06/28/2019   PLT 262 06/28/2019   GLUCOSE 111 (H) 10/20/2019   CHOL 184 10/20/2019   TRIG 420 (H) 10/20/2019   HDL 45 (L) 10/20/2019   LDLDIRECT 125.0 06/05/2015   LDLCALC  10/20/2019     Comment:     . LDL cholesterol not calculated. Triglyceride levels greater than 400 mg/dL invalidate calculated LDL results. . Reference range: <100 . Desirable range <100 mg/dL for primary prevention;   <70 mg/dL for patients with CHD or diabetic patients  with > or = 2 CHD risk factors. [name] LDL-C is now calculated using the Martin-Hopkins  calculation, which is a validated novel method providing  better accuracy than the Friedewald equation in the  estimation of LDL-C.  Cresenciano Genre et al. Annamaria Helling. 5784;696(29): 2061-2068  (http://education.[facility].com/faq/FAQ164)    ALT 15 06/28/2019   AST 19 06/28/2019   NA 141 10/20/2019   K 4.7 10/20/2019   CL 105 10/20/2019   CREATININE 1.74 (H) 10/20/2019   BUN 19 10/20/2019   CO2 24 10/20/2019   TSH 0.56 10/20/2019   INR 0.98 12/30/2016   HGBA1C 6.2 04/15/2019    US BREAST LTD UNI LEFT INC AXILLA  Result Date: 07/28/2019 CLINICAL DATA:  79 year old female presenting for evaluation of a intermittently mildly tender palpable lump in the medial left breast. At the time of  ultrasound she also indicated a second palpable area medial to the tender palpable lump. She has had a lumpectomy at this site in 2018, and there is a known seroma at the lumpectomy site. She has a history of a right mastectomy. EXAM: DIGITAL DIAGNOSTIC UNILATERAL LEFT MAMMOGRAM WITH TOMO AND CAD; ULTRASOUND LEFT BREAST LIMITED LEFT BREAST ULTRASOUND COMPARISON:  Previous exam(s). ACR Breast Density Category a: The breast tissue is almost entirely fatty. FINDINGS: A BB has been placed at the  palpable site in the medial left breast. This is positioned near the patient's lumpectomy scar, and a seroma is seen deep to the palpable marker. This is stable to slightly smaller compared to the exam in September of 2020. No suspicious calcifications, masses or areas of distortion are seen in the bilateral breasts. Mammographic images were processed with CAD. Physical exam of the left breast demonstrates a firm mass deep to the incision site in the lower-inner left breast, corresponding with the known seroma. A second palpable area is slightly more medial, the feel of a fat lobule. Ultrasound targeted to the left breast at 8 o'clock, 4 cm from the nipple demonstrates that the previously seen seroma persists, today measuring 2.2 x 1.2 x 1.6 cm, previously 2.5 x 1.3 x 2.2 cm. Ultrasound targeted to the second palpable lump in the left breast at 8 o'clock, 7 cm from the nipple demonstrates normal fibroglandular tissue. No suspicious masses or areas of shadowing are identified. IMPRESSION: 1. The seroma in the lower-inner left breast has mildly decreased in size. Otherwise, the lumpectomy site is stable. 2. No suspicious mammographic or targeted sonographic abnormalities are identified at the second palpable site of concern in the medial left breast. 3.  No evidence of left breast malignancy. RECOMMENDATION: 1. I explained to the patient that typically seromas will take years to decrease in size, and may never completely resolve.  We do not typically aspirate seromas and less they are significantly painful or infected. As the patient's symptoms are mild and intermittent, we agreed to defer aspiration today. 2.  Screening mammogram in one year.(Code:SM-B-01Y) I have discussed the findings and recommendations with the patient. If applicable, a reminder letter will be sent to the patient regarding the next appointment. BI-RADS CATEGORY  2: Benign. Electronically Signed   By: Ammie Ferrier M.D.   On: 07/28/2019 11:17   MM DIAG BREAST TOMO UNI LEFT  Result Date: 07/28/2019 CLINICAL DATA:  79 year old female presenting for evaluation of a intermittently mildly tender palpable lump in the medial left breast. At the time of ultrasound she also indicated a second palpable area medial to the tender palpable lump. She has had a lumpectomy at this site in 2018, and there is a known seroma at the lumpectomy site. She has a history of a right mastectomy. EXAM: DIGITAL DIAGNOSTIC UNILATERAL LEFT MAMMOGRAM WITH TOMO AND CAD; ULTRASOUND LEFT BREAST LIMITED LEFT BREAST ULTRASOUND COMPARISON:  Previous exam(s). ACR Breast Density Category a: The breast tissue is almost entirely fatty. FINDINGS: A BB has been placed at the palpable site in the medial left breast. This is positioned near the patient's lumpectomy scar, and a seroma is seen deep to the palpable marker. This is stable to slightly smaller compared to the exam in September of 2020. No suspicious calcifications, masses or areas of distortion are seen in the bilateral breasts. Mammographic images were processed with CAD. Physical exam of the left breast demonstrates a firm mass deep to the incision site in the lower-inner left breast, corresponding with the known seroma. A second palpable area is slightly more medial, the feel of a fat lobule. Ultrasound targeted to the left breast at 8 o'clock, 4 cm from the nipple demonstrates that the previously seen seroma persists, today measuring 2.2 x 1.2  x 1.6 cm, previously 2.5 x 1.3 x 2.2 cm. Ultrasound targeted to the second palpable lump in the left breast at 8 o'clock, 7 cm from the nipple demonstrates normal fibroglandular tissue. No suspicious masses or areas  of shadowing are identified. IMPRESSION: 1. The seroma in the lower-inner left breast has mildly decreased in size. Otherwise, the lumpectomy site is stable. 2. No suspicious mammographic or targeted sonographic abnormalities are identified at the second palpable site of concern in the medial left breast. 3.  No evidence of left breast malignancy. RECOMMENDATION: 1. I explained to the patient that typically seromas will take years to decrease in size, and may never completely resolve. We do not typically aspirate seromas and less they are significantly painful or infected. As the patient's symptoms are mild and intermittent, we agreed to defer aspiration today. 2.  Screening mammogram in one year.(Code:SM-B-01Y) I have discussed the findings and recommendations with the patient. If applicable, a reminder letter will be sent to the patient regarding the next appointment. BI-RADS CATEGORY  2: Benign. Electronically Signed   By: Ammie Ferrier M.D.   On: 07/28/2019 11:17    Assessment & Plan:    Walker Kehr, MD

## 2020-01-27 ENCOUNTER — Inpatient Hospital Stay: Payer: Medicare Other | Admitting: Oncology

## 2020-01-27 ENCOUNTER — Other Ambulatory Visit: Payer: Medicare Other

## 2020-01-27 ENCOUNTER — Inpatient Hospital Stay: Payer: Medicare Other

## 2020-01-27 ENCOUNTER — Ambulatory Visit: Payer: Medicare Other

## 2020-01-27 ENCOUNTER — Ambulatory Visit: Payer: Medicare Other | Admitting: Oncology

## 2020-02-01 NOTE — Progress Notes (Signed)
. [name]   DOB: November 01, 1940  MR#: [id number]  CSN#:[id number]  Patient Care Team: [name], MD as PCP - General Batul Diego, [name], MD as Consulting Physician (Oncology) [name], MD as Consulting Physician (General Surgery) [name], MD as Consulting Physician (Gastroenterology) [name], MD as Consulting Physician (Radiation Oncology)   CHIEF COMPLAINT: Weakly estrogen receptor positive breast cancer (s/p right mastectomy)  CURRENT TREATMENT: Anastrozole, Prolia   INTERVAL HISTORY: [name] [name] returns for follow-up of her estrogen receptor weakly positive breast cancer.  She continues on anastrozole with good tolerance.  Hot flashes and vaginal dryness are not issues for her  [name] [name]'s last bone density screening on 05/06/2017, showed a T-score of -3.4, which is considered osteoporotic.  She is on denosumab/Prolia, with her most recent treatment 06/28/2019.  She is scheduled for dose today.  Since her last visit, she presented to her PCP with a mildly tender, palpable lump in her medial left breast. She underwent left diagnostic mammography with tomography and left breast ultrasonography at [facility] on 07/28/2019 showing: breast density category A; mild decrease in size of known lower-inner left breast seroma; no suspicious abnormalities identified at palpable site of concern in medial left breast; no evidence of malignancy.    REVIEW OF SYSTEMS: [name] [name] says she turned 79 and it is "miserable".  She actually does not have a specific misery to report.  She just does not feel particularly good.  She worries about her left breast seroma.  She misses her dog.  She needs glasses.  She is not exercising regularly.  A detailed review of systems today was otherwise stable   COVID 19 VACCINATION STATUS: fully vaccinated [employer]), with booster 12/25/2019   BREAST CANCER HISTORY: From the recent summary note:  I last saw [name] [name] in 2013 when she was  released from follow-up from her right breast cancer recurrence in 2004. She is status post right mastectomy and adjuvant chemotherapy for that triple negative tumor. More recently, on 07/16/2016 she underwent left screening mammography at the [facility] on 07/16/2016. This found the breast density to be category B. There was a possible asymmetry in the left breast, and on 07/19/2016 she underwent left diagnostic mammography with tomography and left breast ultrasonography. This confirmed a well circumscribed oval mass at the 9:00 position of the left breast measuring approximately 0.8 cm. This was not palpable. On ultrasonography the mass measured 0.6 cm and it was located at the 9:00 radiant 4 cm from the nipple. The left axilla was sonographically benign.  On June 11 [place] underwent biopsy of the left breast mass in question and this showed (SAA [phone]) and invasive ductal carcinoma, grade 3, estrogen receptor 5% positive with weak staining intensity, progesterone receptor negative, with an MIB-1 of 80%, and HER-2 nonamplified, the signals ratio being 1.46 and the number per cell 1.90.  Her subsequent history is as detailed below.   PAST MEDICAL HISTORY: Past Medical History:  Diagnosis Date  . Allergic rhinitis   . Anxiety   . Breast cancer ([place]) hx 2004   recurrent 2006 DR Terry Abila  . Family history of breast cancer   . 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 HTN (hypertension)   . Hyperlipidemia   . Hypothyroidism   . Personal history of chemotherapy 2002  . Personal history of radiation therapy 2002  . Psoriasis   . S/P thyroidectomy 07/2005   2 cm  largest diameter (1 other tiny focus)/ i-131 rx 99 mci 08/2005  . Thyroid cancer ([place])    Papillary Stage 1 - Dr Loanne Drilling  . Vitamin B12 deficiency 2009  . Vitamin D deficiency 2009    PAST SURGICAL HISTORY: Past Surgical History:  Procedure Laterality Date   . BREAST LUMPECTOMY    . BREAST LUMPECTOMY WITH RADIOACTIVE SEED AND SENTINEL LYMPH NODE BIOPSY Left 09/11/2016   Procedure: LEFT BREAST LUMPECTOMY WITH RADIOACTIVE SEED AND LEFT AXILLARY SENTINEL LYMPH NODE BIOPSY WITH BLUE DYE INJECTION;  Surgeon: [name], MD;  Location: [facility];  Service: General;  Laterality: Left;  . IR FLUORO GUIDE PORT INSERTION RIGHT  09/13/2016  . IR REMOVAL TUN ACCESS W/ PORT W/O FL MOD SED  12/30/2016  . IR US GUIDE VASC ACCESS RIGHT  09/13/2016  . MASTECTOMY     Right  . PORTACATH PLACEMENT N/A 09/11/2016   Procedure: ATTEMPTED INSERTION PORT-A-CATH WITH ULTRA SOUND GUIDANCE;  Surgeon: [name], MD;  Location: [facility];  Service: General;  Laterality: N/A;  . REDUCTION MAMMAPLASTY Left 2005  . THYROIDECTOMY  2007    FAMILY HISTORY Family History  Problem Relation Age of Onset  . Stroke Mother   . Allergies Mother   . Asthma Mother   . Clotting disorder Mother   . Heart disease Father 81       MI  . Allergies Sister   . Asthma Sister   . Breast cancer Sister 3       Lymphoma 89; currently 56  . Lymphoma Sister   . Asthma Brother   . Leukemia Brother        dx 51s  . Allergies Daughter   . Asthma Daughter   . Allergies Sister   . Breast cancer Sister 68       currently 67  . Kidney cancer Paternal Aunt        kidney ca; deceased 41  . Liver cancer Paternal Uncle        unk. primary ("liver")  . Throat cancer Maternal Grandmother        deceased 66  . Lung cancer Maternal Grandfather        deceased 74  . Pancreatic cancer Paternal Grandfather        deceased 86  . Cancer Paternal Aunt        "abdominal"; deceased 80  Family history includes a sister, a paternal aunt, and a paternal cousin with breast cancer all older then 82 at diagnosis   GYN HISTORY: Menarche age 40, first live birth age 34. She is GX P3. She went through the change of life age 87. She did not take hormone  replacement.   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 Her husband [name] is retired from [employer]. Her children are [name], who works for time [employer] in [place], and Kenilworth, who lives in [place] and works for [employer]. The third child, [name], died in an automobile accident at age 33. The patient has one granddaughter. Her dog Baxter Hire, just passed away at 75 years old. 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    HEALTH MAINTENANCE: Social History   Tobacco Use  . Smoking status: Never Smoker  . Smokeless tobacco: Never Used  Substance Use Topics  . Alcohol  use: No  . Drug use: No     Colonoscopy:  PAP:  Bone density: 05/06/2017 showed a T score of -3.4 osteoporosis  Lipid panel:  Allergies  Allergen Reactions  . Pneumococcal Vaccines Other (See Comments)    Was really sick   . Sulfa Antibiotics Other (See Comments)    Pt believes it was hives  . Sulfacetamide Sodium-Sulfur   . Tape Itching, Dermatitis and Rash  . Tegaderm Ag Mesh [Silver] Itching and Rash    Current Outpatient Medications  Medication Sig Dispense Refill  . ALPRAZolam (XANAX) 1 MG tablet Take 0.5-1 tablets (0.5-1 mg total) by mouth 2 (two) times daily as needed for anxiety. 60 tablet 3  . anastrozole (ARIMIDEX) 1 MG tablet TAKE 1 TABLET BY MOUTH EVERY DAY 90 tablet 4  . cholecalciferol (VITAMIN D) 1000 units tablet Take 2 tablets (2,000 Units total) by mouth daily. 100 tablet 3  . levothyroxine (SYNTHROID) 112 MCG tablet TAKE 1 TABLET (112 MCG TOTAL) BY MOUTH DAILY. 90 tablet 2  . meclizine (ANTIVERT) 12.5 MG tablet 1-2 tab by mouth every 8 hrs as needed (Patient not taking: Reported on 10/20/2019) 40 tablet 1  . telmisartan (MICARDIS) 40 MG tablet TAKE 1 TABLET BY MOUTH EVERY DAY 90 tablet 0  . venlafaxine XR (EFFEXOR XR)  150 MG 24 hr capsule Take 1 capsule (150 mg total) by mouth daily with breakfast. 30 capsule 5  . vitamin B-12 (CYANOCOBALAMIN) 1000 MCG tablet Take 1,000 mcg by mouth every other day.    . zinc gluconate 50 MG tablet Take 50 mg by mouth daily.     No current facility-administered medications for this visit.    OBJECTIVE: White [name] in no acute distress  Vitals:   02/02/20 1216  BP: 128/87  Pulse: (!) 125  Resp: 17  Temp: (!) 97.4 F (36.3 C)  SpO2: 100%     Body mass index is 26.23 kg/m.    ECOG FS:   Sclerae unicteric, EOMs intact Wearing a mask No cervical or supraclavicular adenopathy Lungs no rales or rhonchi Heart regular rate and rhythm; repeat rate 98 Abd soft, nontender, positive bowel sounds MSK no focal spinal tenderness, no upper extremity lymphedema Neuro: nonfocal, well oriented, appropriate affect Breasts: The right breast is status post remote lumpectomy and radiation.  There is no evidence of local recurrence.  The left breast is status post more recent lumpectomy and radiation.  There is a palpable mass which has been identified as a seroma and measures approximately 1cm.  There is a slight dimple in the skin in that area.  Both axillae are benign   LAB RESULTS: Lab Results  Component Value Date   WBC 6.1 02/02/2020   NEUTROABS 3.1 02/02/2020   HGB 12.7 02/02/2020   HCT 39.8 02/02/2020   MCV 93.2 02/02/2020   PLT 337 02/02/2020      Chemistry      Component Value Date/Time   NA 140 02/02/2020 1146   NA 139 12/11/2016 1017   K 3.8 02/02/2020 1146   K 3.7 12/11/2016 1017   CL 106 02/02/2020 1146   CL 104 10/09/2011 1403   CO2 23 02/02/2020 1146   CO2 22 12/11/2016 1017   BUN 16 02/02/2020 1146   BUN 16.9 12/11/2016 1017   CREATININE 1.52 (H) 02/02/2020 1146   CREATININE 1.74 (H) 10/20/2019 1634   CREATININE 1.2 (H) 12/11/2016 1017      Component Value Date/Time   CALCIUM 10.0 02/02/2020 1146  CALCIUM 9.0 12/11/2016 1017   ALKPHOS 101  02/02/2020 1146   ALKPHOS 139 12/11/2016 1017   AST 21 02/02/2020 1146   AST 65 (H) 12/11/2016 1017   ALT 17 02/02/2020 1146   ALT 51 12/11/2016 1017   BILITOT 0.6 02/02/2020 1146   BILITOT 0.42 12/11/2016 1017       Lab Results  Component Value Date   LABCA2 16 10/09/2011    No components found for: [id number]  No results for input(s): INR in the last 168 hours.  Urinalysis    Component Value Date/Time   COLORURINE YELLOW 06/05/2015 1559   APPEARANCEUR CLEAR 06/05/2015 1559   LABSPEC 1.020 06/05/2015 1559   PHURINE 5.5 06/05/2015 1559   GLUCOSEU NEGATIVE 06/05/2015 1559   HGBUR NEGATIVE 06/05/2015 1559   BILIRUBINUR NEGATIVE 06/05/2015 1559   KETONESUR NEGATIVE 06/05/2015 1559   UROBILINOGEN 0.2 06/05/2015 1559   NITRITE NEGATIVE 06/05/2015 1559   LEUKOCYTESUR MODERATE (A) 06/05/2015 1559    STUDIES: No results found.   ASSESSMENT: 79 y.o. [name] [name]   (1) status post right lumpectomy and sentinel lymph node biopsy in September 2002 for multifocal breast carcinoma, triple negative.  Treated with CMF followed by radiation.   (2) Local recurrence in April 2004, status post right modified radical mastectomy with TRAM reconstruction for what proved to be a T1c N0, stage IA triple negative breast carcinoma.  Treated adjuvantly with paclitaxel and doxorubicin x4 in 2004.  Off treatment since August 2004 with no evidence of recurrence  (3) history of papillary thyroid cancer s/p thyroidectomy June 2007, s/p radioactive iodine July 2007  (4) status post left breast upper outer quadrant biopsy 08/21/2016 for a clinical T1b N0, stage IB invasive ductal carcinoma, grade 3, weakly estrogen receptor positive, progesterone receptor and HER-2 negative, with an MIB-1 of 80%.  (5) left lumpectomy and sentinel lymph node sampling 09/11/2016 found a pT1b pN0, stage IB invasive ductal carcinoma, grade 3, with negative margins.  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*) (a) there were no deleterious mutations in ALK, APC, ATM, AXIN2, BAP1, BARD1, BLM, BMPR1A, BRCA1, BRCA2, BRIP1, CASR, CDC73, CDH1, CDK4, CDKN1B, CDKN1C, CDKN2A, CEBPA, CHEK2, CTNNA1, DICER1, DIS3L2, EGFR, EPCAM, FH, FLCN, GATA2, GPC3, GREM1, HOXB13, HRAS, KIT, MAX, MEN1, MET, MITF, MLH1, MSH2, MSH3, MSH6, MUTYH, NBN, NF1, NF2, NTHL1, PALB2, PDGFRA, PHOX2B, PMS2, POLD1, POLE, POT1, PRKAR1A, PTCH1, PTEN, RAD50, RAD51C, RAD51D, RB1, RECQL4, RET, RUNX1, SDHA, SDHAF2, SDHB, SDHC, SDHD, SMAD4, SMARCA4, SMARCB1, SMARCE1, STK11, SUFU, TERC, TERT, TMEM127, TP53, TSC1, TSC2, VHL, WRN, WT1 (b) while homozygous mutations in the NPH L1 gene are associated with autosomal recessive polyposis, there is no evidence to suggest that a single mutation in this gene increases cancer risk.  (9) started anastrozole 03/14/2017 (a) bone density on 05/06/2017 showed a T score of -3.4--osteoporosis (b) started denosumab/Prolia June 2019, to be repeated every 6 months   PLAN:  PeJadalees now a little over 3 years out from definitive surgery for her left breast cancer with no evidence of disease recurrence.  This is very favorable.  She is bothered by the seroma.  She was again reassured that this is benign and requires nothing about her being aware of it and making sure that it does not change.  She is lonely and bothered and does not know why she needs to put on make-up in the morning.  I really think she needs a dog and she is going to be going to the pound to get 1.   When she does she will let me know what she got and if possible will bring it by for [place] to meet  Otherwise she will receive Prolia today and again in 6 months.  She knows to call for any other issue that may develop before that visit.  Total encounter time 25 minutes.Sarajane Jews C. Kyleen Villatoro, MD 02/02/20 12:44 PM Medical Oncology and Hematology [facility] [place], [place] [address] Tel. [phone]    Fax. [phone]   I, Wilburn Mylar, am acting as scribe for Dr. [name]. Chirsty Armistead.    *Total Encounter Time as defined by the Centers for Medicare and Medicaid Services includes, in addition to the face-to-face time of a patient visit (documented in the note above) non-face-to-face time: obtaining and reviewing outside history, ordering and reviewing medications, tests or procedures, care coordination (communications with other health care professionals or caregivers) and documentation in the medical record.

## 2020-02-02 ENCOUNTER — Inpatient Hospital Stay: Payer: Medicare Other | Attending: Oncology

## 2020-02-02 ENCOUNTER — Other Ambulatory Visit: Payer: Self-pay

## 2020-02-02 ENCOUNTER — Inpatient Hospital Stay: Payer: Medicare Other

## 2020-02-02 ENCOUNTER — Inpatient Hospital Stay: Payer: Medicare Other | Admitting: Oncology

## 2020-02-02 VITALS — BP 128/87 | HR 125 | Temp 97.4°F | Resp 17 | Ht 63.0 in | Wt 148.1 lb

## 2020-02-02 DIAGNOSIS — Z8349 Family history of other endocrine, nutritional and metabolic diseases: Secondary | ICD-10-CM | POA: Insufficient documentation

## 2020-02-02 DIAGNOSIS — F419 Anxiety disorder, unspecified: Secondary | ICD-10-CM | POA: Diagnosis not present

## 2020-02-02 DIAGNOSIS — Z8249 Family history of ischemic heart disease and other diseases of the circulatory system: Secondary | ICD-10-CM | POA: Insufficient documentation

## 2020-02-02 DIAGNOSIS — C50412 Malignant neoplasm of upper-outer quadrant of left female breast: Secondary | ICD-10-CM

## 2020-02-02 DIAGNOSIS — Z170 Estrogen receptor positive status [ER+]: Secondary | ICD-10-CM

## 2020-02-02 DIAGNOSIS — C50012 Malignant neoplasm of nipple and areola, left female breast: Secondary | ICD-10-CM

## 2020-02-02 DIAGNOSIS — M8580 Other specified disorders of bone density and structure, unspecified site: Secondary | ICD-10-CM | POA: Insufficient documentation

## 2020-02-02 DIAGNOSIS — Z79899 Other long term (current) drug therapy: Secondary | ICD-10-CM | POA: Diagnosis not present

## 2020-02-02 DIAGNOSIS — Z923 Personal history of irradiation: Secondary | ICD-10-CM | POA: Insufficient documentation

## 2020-02-02 DIAGNOSIS — Z85850 Personal history of malignant neoplasm of thyroid: Secondary | ICD-10-CM | POA: Insufficient documentation

## 2020-02-02 DIAGNOSIS — E890 Postprocedural hypothyroidism: Secondary | ICD-10-CM | POA: Insufficient documentation

## 2020-02-02 DIAGNOSIS — Z79811 Long term (current) use of aromatase inhibitors: Secondary | ICD-10-CM | POA: Insufficient documentation

## 2020-02-02 DIAGNOSIS — Z9011 Acquired absence of right breast and nipple: Secondary | ICD-10-CM | POA: Insufficient documentation

## 2020-02-02 DIAGNOSIS — Z95828 Presence of other vascular implants and grafts: Secondary | ICD-10-CM

## 2020-02-02 LAB — CBC WITH DIFFERENTIAL/PLATELET
Abs Immature Granulocytes: 0 10*3/uL (ref 0.00–0.07)
Basophils Absolute: 0 10*3/uL (ref 0.0–0.1)
Basophils Relative: 0 %
Eosinophils Absolute: 0.2 10*3/uL (ref 0.0–0.5)
Eosinophils Relative: 3 %
HCT: 39.8 % (ref 36.0–46.0)
Hemoglobin: 12.7 g/dL (ref 12.0–15.0)
Immature Granulocytes: 0 %
Lymphocytes Relative: 37 %
Lymphs Abs: 2.2 10*3/uL (ref 0.7–4.0)
MCH: 29.7 pg (ref 26.0–34.0)
MCHC: 31.9 g/dL (ref 30.0–36.0)
MCV: 93.2 fL (ref 80.0–100.0)
Monocytes Absolute: 0.5 10*3/uL (ref 0.1–1.0)
Monocytes Relative: 8 %
Neutro Abs: 3.1 10*3/uL (ref 1.7–7.7)
Neutrophils Relative %: 52 %
Platelets: 337 10*3/uL (ref 150–400)
RBC: 4.27 MIL/uL (ref 3.87–5.11)
RDW: 13.4 % (ref 11.5–15.5)
WBC: 6.1 10*3/uL (ref 4.0–10.5)
nRBC: 0 % (ref 0.0–0.2)

## 2020-02-02 LAB — COMPREHENSIVE METABOLIC PANEL
ALT: 17 U/L (ref 0–44)
AST: 21 U/L (ref 15–41)
Albumin: 4 g/dL (ref 3.5–5.0)
Alkaline Phosphatase: 101 U/L (ref 38–126)
Anion gap: 11 (ref 5–15)
BUN: 16 mg/dL (ref 8–23)
CO2: 23 mmol/L (ref 22–32)
Calcium: 10 mg/dL (ref 8.9–10.3)
Chloride: 106 mmol/L (ref 98–111)
Creatinine, Ser: 1.52 mg/dL — ABNORMAL HIGH (ref 0.44–1.00)
GFR, Estimated: 34 mL/min — ABNORMAL LOW (ref >60)
Glucose, Bld: 172 mg/dL — ABNORMAL HIGH (ref 70–99)
Potassium: 3.8 mmol/L (ref 3.5–5.1)
Sodium: 140 mmol/L (ref 135–145)
Total Bilirubin: 0.6 mg/dL (ref 0.3–1.2)
Total Protein: 7.7 g/dL (ref 6.5–8.1)

## 2020-02-02 MED ORDER — DENOSUMAB 60 MG/ML ~~LOC~~ SOSY
60.0000 mg | PREFILLED_SYRINGE | Freq: Once | SUBCUTANEOUS | Status: AC
  Administered 2020-02-02: 60 mg via SUBCUTANEOUS

## 2020-02-02 MED ORDER — DENOSUMAB 60 MG/ML ~~LOC~~ SOSY
PREFILLED_SYRINGE | SUBCUTANEOUS | Status: AC
  Filled 2020-02-02: qty 1

## 2020-03-06 ENCOUNTER — Ambulatory Visit: Payer: Medicare Other | Admitting: Internal Medicine

## 2020-03-06 ENCOUNTER — Other Ambulatory Visit: Payer: Self-pay

## 2020-03-07 ENCOUNTER — Ambulatory Visit: Payer: Medicare Other | Admitting: Internal Medicine

## 2020-03-13 ENCOUNTER — Other Ambulatory Visit: Payer: Self-pay | Admitting: Internal Medicine

## 2020-03-13 ENCOUNTER — Other Ambulatory Visit: Payer: Self-pay

## 2020-03-14 ENCOUNTER — Ambulatory Visit (INDEPENDENT_AMBULATORY_CARE_PROVIDER_SITE_OTHER): Payer: Medicare Other | Admitting: Internal Medicine

## 2020-03-14 ENCOUNTER — Encounter: Payer: Self-pay | Admitting: Internal Medicine

## 2020-03-14 VITALS — BP 118/72 | HR 121 | Temp 98.1°F | Ht 63.0 in | Wt 148.0 lb

## 2020-03-14 DIAGNOSIS — R739 Hyperglycemia, unspecified: Secondary | ICD-10-CM

## 2020-03-14 DIAGNOSIS — R270 Ataxia, unspecified: Secondary | ICD-10-CM

## 2020-03-14 DIAGNOSIS — I10 Essential (primary) hypertension: Secondary | ICD-10-CM

## 2020-03-14 DIAGNOSIS — L299 Pruritus, unspecified: Secondary | ICD-10-CM | POA: Insufficient documentation

## 2020-03-14 DIAGNOSIS — E538 Deficiency of other specified B group vitamins: Secondary | ICD-10-CM | POA: Diagnosis not present

## 2020-03-14 DIAGNOSIS — E559 Vitamin D deficiency, unspecified: Secondary | ICD-10-CM

## 2020-03-14 DIAGNOSIS — I2510 Atherosclerotic heart disease of native coronary artery without angina pectoris: Secondary | ICD-10-CM

## 2020-03-14 LAB — COMPREHENSIVE METABOLIC PANEL
ALT: 16 U/L (ref 0–35)
AST: 23 U/L (ref 0–37)
Albumin: 4.3 g/dL (ref 3.5–5.2)
Alkaline Phosphatase: 81 U/L (ref 39–117)
BUN: 22 mg/dL (ref 6–23)
CO2: 27 mEq/L (ref 19–32)
Calcium: 10.6 mg/dL — ABNORMAL HIGH (ref 8.4–10.5)
Chloride: 102 mEq/L (ref 96–112)
Creatinine, Ser: 1.33 mg/dL — ABNORMAL HIGH (ref 0.40–1.20)
GFR: 37.67 mL/min — ABNORMAL LOW (ref >60.00)
Glucose, Bld: 120 mg/dL — ABNORMAL HIGH (ref 70–99)
Potassium: 4.5 mEq/L (ref 3.5–5.1)
Sodium: 137 mEq/L (ref 135–145)
Total Bilirubin: 0.4 mg/dL (ref 0.2–1.2)
Total Protein: 7.6 g/dL (ref 6.0–8.3)

## 2020-03-14 LAB — HEMOGLOBIN A1C: Hgb A1c MFr Bld: 6.4 % (ref 4.6–6.5)

## 2020-03-14 LAB — TSH: TSH: 0.87 u[IU]/mL (ref 0.35–4.50)

## 2020-03-14 NOTE — Assessment & Plan Note (Signed)
D/c [facility] w/LFTs

## 2020-03-14 NOTE — Assessment & Plan Note (Signed)
Vit D 

## 2020-03-14 NOTE — Assessment & Plan Note (Addendum)
Unclear etiology.  Obtain Head CT She consumes no alcohol

## 2020-03-14 NOTE — Progress Notes (Signed)
Subjective:  Patient ID: [name] [name], female    DOB: [birth year]  Age: 80 y.o. MRN: [id number]  CC: No chief complaint on file.   HPI [name] [name] presents for hand itching after she got a booster in Nov 2021 C/o chills She is complaining of unsteady gait, dizziness.  Follow-up hypothyroidism  Outpatient Medications Prior to Visit  Medication Sig Dispense Refill  . ALPRAZolam (XANAX) 1 MG tablet Take 0.5-1 tablets (0.5-1 mg total) by mouth 2 (two) times daily as needed for anxiety. 60 tablet 3  . anastrozole (ARIMIDEX) 1 MG tablet TAKE 1 TABLET BY MOUTH EVERY DAY 90 tablet 4  . cholecalciferol (VITAMIN D) 1000 units tablet Take 2 tablets (2,000 Units total) by mouth daily. 100 tablet 3  . levothyroxine (SYNTHROID) 112 MCG tablet TAKE 1 TABLET (112 MCG TOTAL) BY MOUTH DAILY. 90 tablet 2  . meclizine (ANTIVERT) 12.5 MG tablet 1-2 tab by mouth every 8 hrs as needed 40 tablet 1  . telmisartan (MICARDIS) 40 MG tablet TAKE 1 TABLET BY MOUTH EVERY DAY 90 tablet 0  . venlafaxine XR (EFFEXOR XR) 150 MG 24 hr capsule Take 1 capsule (150 mg total) by mouth daily with breakfast. 30 capsule 5  . vitamin B-12 (CYANOCOBALAMIN) 1000 MCG tablet Take 1,000 mcg by mouth every other day.    . zinc gluconate 50 MG tablet Take 50 mg by mouth daily.     No facility-administered medications prior to visit.    ROS: Review of Systems  Constitutional: Negative for activity change, appetite change, chills, fatigue and unexpected weight change.  HENT: Negative for congestion, mouth sores and sinus pressure.   Eyes: Negative for visual disturbance.  Respiratory: Negative for cough and chest tightness.   Gastrointestinal: Negative for abdominal pain and nausea.  Genitourinary: Negative for difficulty urinating, frequency and vaginal pain.  Musculoskeletal: Negative for back pain and gait problem.  Skin: Negative for pallor and rash.  Neurological: Negative for dizziness, tremors, weakness, numbness and  headaches.  Psychiatric/Behavioral: Positive for dysphoric mood and sleep disturbance. Negative for confusion. The patient is nervous/anxious.     Objective:  BP 118/72   Pulse (!) 121   Temp 98.1 F (36.7 C) (Oral)   Ht 5\' 3"  (1.6 m)   Wt 148 lb (67.1 kg)   SpO2 96%   BMI 26.22 kg/m   BP Readings from Last 3 Encounters:  03/14/20 118/72  02/02/20 128/87  01/19/20 112/78    Wt Readings from Last 3 Encounters:  03/14/20 148 lb (67.1 kg)  02/02/20 148 lb 1.6 oz (67.2 kg)  01/19/20 150 lb (68 kg)    Physical Exam Constitutional:      General: She is not in acute distress.    Appearance: She is well-developed.  HENT:     Head: Normocephalic.     Right Ear: External ear normal.     Left Ear: External ear normal.     Nose: Nose normal.     Mouth/Throat:     Mouth: Oropharynx is clear and moist.  Eyes:     General:        Right eye: No discharge.        Left eye: No discharge.     Conjunctiva/sclera: Conjunctivae normal.     Pupils: Pupils are equal, round, and reactive to light.  Neck:     Thyroid: No thyromegaly.     Vascular: No JVD.     Trachea: No tracheal deviation.  Cardiovascular:  Rate and Rhythm: Normal rate and regular rhythm.     Heart sounds: Normal heart sounds.  Pulmonary:     Effort: No respiratory distress.     Breath sounds: No stridor. No wheezing.  Abdominal:     General: Bowel sounds are normal. There is no distension.     Palpations: Abdomen is soft. There is no mass.     Tenderness: There is no abdominal tenderness. There is no guarding or rebound.  Musculoskeletal:        General: No tenderness or edema.     Cervical back: Normal range of motion and neck supple.  Lymphadenopathy:     Cervical: No cervical adenopathy.  Skin:    Findings: No erythema or rash.  Neurological:     Cranial Nerves: No cranial nerve deficit.     Motor: No abnormal muscle tone.     Coordination: Coordination normal.     Deep Tendon Reflexes: Reflexes  normal.  Psychiatric:        Mood and Affect: Mood and affect normal.        Behavior: Behavior normal.        Thought Content: Thought content normal.        Judgment: Judgment normal.   Slightly ataxic  Lab Results  Component Value Date   WBC 6.1 02/02/2020   HGB 12.7 02/02/2020   HCT 39.8 02/02/2020   PLT 337 02/02/2020   GLUCOSE 172 (H) 02/02/2020   CHOL 184 10/20/2019   TRIG 420 (H) 10/20/2019   HDL 45 (L) 10/20/2019   LDLDIRECT 125.0 06/05/2015   LDLCALC  10/20/2019     Comment:     . LDL cholesterol not calculated. Triglyceride levels greater than 400 mg/dL invalidate calculated LDL results. . Reference range: <100 . Desirable range <100 mg/dL for primary prevention;   <70 mg/dL for patients with CHD or diabetic patients  with > or = 2 CHD risk factors. [name] LDL-C is now calculated using the Martin-Hopkins  calculation, which is a validated novel method providing  better accuracy than the Friedewald equation in the  estimation of LDL-C.  Cresenciano Genre et al. Annamaria Helling. 1610;960(45): 2061-2068  (http://education.[facility].com/faq/FAQ164)    ALT 17 02/02/2020   AST 21 02/02/2020   NA 140 02/02/2020   K 3.8 02/02/2020   CL 106 02/02/2020   CREATININE 1.52 (H) 02/02/2020   BUN 16 02/02/2020   CO2 23 02/02/2020   TSH 0.56 10/20/2019   INR 0.98 12/30/2016   HGBA1C 6.2 04/15/2019    US BREAST LTD UNI LEFT INC AXILLA  Result Date: 07/28/2019 CLINICAL DATA:  80 year old female presenting for evaluation of a intermittently mildly tender palpable lump in the medial left breast. At the time of ultrasound she also indicated a second palpable area medial to the tender palpable lump. She has had a lumpectomy at this site in 2018, and there is a known seroma at the lumpectomy site. She has a history of a right mastectomy. EXAM: DIGITAL DIAGNOSTIC UNILATERAL LEFT MAMMOGRAM WITH TOMO AND CAD; ULTRASOUND LEFT BREAST LIMITED LEFT BREAST ULTRASOUND COMPARISON:  Previous exam(s).  ACR Breast Density Category a: The breast tissue is almost entirely fatty. FINDINGS: A BB has been placed at the palpable site in the medial left breast. This is positioned near the patient's lumpectomy scar, and a seroma is seen deep to the palpable marker. This is stable to slightly smaller compared to the exam in September of 2020. No suspicious calcifications, masses or areas of distortion are  seen in the bilateral breasts. Mammographic images were processed with CAD. Physical exam of the left breast demonstrates a firm mass deep to the incision site in the lower-inner left breast, corresponding with the known seroma. A second palpable area is slightly more medial, the feel of a fat lobule. Ultrasound targeted to the left breast at 8 o'clock, 4 cm from the nipple demonstrates that the previously seen seroma persists, today measuring 2.2 x 1.2 x 1.6 cm, previously 2.5 x 1.3 x 2.2 cm. Ultrasound targeted to the second palpable lump in the left breast at 8 o'clock, 7 cm from the nipple demonstrates normal fibroglandular tissue. No suspicious masses or areas of shadowing are identified. IMPRESSION: 1. The seroma in the lower-inner left breast has mildly decreased in size. Otherwise, the lumpectomy site is stable. 2. No suspicious mammographic or targeted sonographic abnormalities are identified at the second palpable site of concern in the medial left breast. 3.  No evidence of left breast malignancy. RECOMMENDATION: 1. I explained to the patient that typically seromas will take years to decrease in size, and may never completely resolve. We do not typically aspirate seromas and less they are significantly painful or infected. As the patient's symptoms are mild and intermittent, we agreed to defer aspiration today. 2.  Screening mammogram in one year.(Code:SM-B-01Y) I have discussed the findings and recommendations with the patient. If applicable, a reminder letter will be sent to the patient regarding the next  appointment. BI-RADS CATEGORY  2: Benign. Electronically Signed   By: Ammie Ferrier M.D.   On: 07/28/2019 11:17   MM DIAG BREAST TOMO UNI LEFT  Result Date: 07/28/2019 CLINICAL DATA:  80 year old female presenting for evaluation of a intermittently mildly tender palpable lump in the medial left breast. At the time of ultrasound she also indicated a second palpable area medial to the tender palpable lump. She has had a lumpectomy at this site in 2018, and there is a known seroma at the lumpectomy site. She has a history of a right mastectomy. EXAM: DIGITAL DIAGNOSTIC UNILATERAL LEFT MAMMOGRAM WITH TOMO AND CAD; ULTRASOUND LEFT BREAST LIMITED LEFT BREAST ULTRASOUND COMPARISON:  Previous exam(s). ACR Breast Density Category a: The breast tissue is almost entirely fatty. FINDINGS: A BB has been placed at the palpable site in the medial left breast. This is positioned near the patient's lumpectomy scar, and a seroma is seen deep to the palpable marker. This is stable to slightly smaller compared to the exam in September of 2020. No suspicious calcifications, masses or areas of distortion are seen in the bilateral breasts. Mammographic images were processed with CAD. Physical exam of the left breast demonstrates a firm mass deep to the incision site in the lower-inner left breast, corresponding with the known seroma. A second palpable area is slightly more medial, the feel of a fat lobule. Ultrasound targeted to the left breast at 8 o'clock, 4 cm from the nipple demonstrates that the previously seen seroma persists, today measuring 2.2 x 1.2 x 1.6 cm, previously 2.5 x 1.3 x 2.2 cm. Ultrasound targeted to the second palpable lump in the left breast at 8 o'clock, 7 cm from the nipple demonstrates normal fibroglandular tissue. No suspicious masses or areas of shadowing are identified. IMPRESSION: 1. The seroma in the lower-inner left breast has mildly decreased in size. Otherwise, the lumpectomy site is stable. 2. No  suspicious mammographic or targeted sonographic abnormalities are identified at the second palpable site of concern in the medial left breast. 3.  No evidence of left breast malignancy. RECOMMENDATION: 1. I explained to the patient that typically seromas will take years to decrease in size, and may never completely resolve. We do not typically aspirate seromas and less they are significantly painful or infected. As the patient's symptoms are mild and intermittent, we agreed to defer aspiration today. 2.  Screening mammogram in one year.(Code:SM-B-01Y) I have discussed the findings and recommendations with the patient. If applicable, a reminder letter will be sent to the patient regarding the next appointment. BI-RADS CATEGORY  2: Benign. Electronically Signed   By: Ammie Ferrier M.D.   On: 07/28/2019 11:17    Assessment & Plan:   There are no diagnoses linked to this encounter.   No orders of the defined types were placed in this encounter.    Follow-up: No follow-ups on file.  Walker Kehr, MD

## 2020-03-14 NOTE — Assessment & Plan Note (Signed)
Vit B12 

## 2020-03-14 NOTE — Patient Instructions (Signed)
Stop Zinc

## 2020-03-16 LAB — PTH, INTACT AND CALCIUM
Calcium: 10.5 mg/dL — ABNORMAL HIGH (ref 8.6–10.4)
PTH: 81 pg/mL — ABNORMAL HIGH (ref 14–64)

## 2020-03-17 ENCOUNTER — Other Ambulatory Visit: Payer: Self-pay

## 2020-03-17 ENCOUNTER — Telehealth: Payer: Self-pay | Admitting: Internal Medicine

## 2020-03-17 ENCOUNTER — Ambulatory Visit (INDEPENDENT_AMBULATORY_CARE_PROVIDER_SITE_OTHER)
Admission: RE | Admit: 2020-03-17 | Discharge: 2020-03-17 | Disposition: A | Discharge status: Home / Self Care | Payer: Medicare Other | Source: Ambulatory Visit | Attending: Internal Medicine | Admitting: Internal Medicine

## 2020-03-17 DIAGNOSIS — R270 Ataxia, unspecified: Secondary | ICD-10-CM

## 2020-03-17 DIAGNOSIS — R0789 Other chest pain: Secondary | ICD-10-CM

## 2020-03-17 NOTE — Telephone Encounter (Signed)
Patient calling to get her lab results and would also like a copy mailed to her. States she has a scan at 3 so she will not be home

## 2020-03-17 NOTE — Telephone Encounter (Signed)
Patient called, states she still is having pain from her fall on 1.28.22. She is complaining of pain on left side in between her shoulder blades near her left breast. When she takes a deep breath she starts to feel pain.  Patient would like to see if Dr. Alain Marion can order an chest xray.   Please advise and call her back at [phone]

## 2020-03-18 ENCOUNTER — Other Ambulatory Visit: Payer: Self-pay | Admitting: Internal Medicine

## 2020-03-18 DIAGNOSIS — E213 Hyperparathyroidism, unspecified: Secondary | ICD-10-CM

## 2020-03-18 NOTE — Telephone Encounter (Signed)
Okay chest x-ray.  Thanks 

## 2020-03-19 NOTE — Assessment & Plan Note (Signed)
Continue with losartan 

## 2020-03-19 NOTE — Assessment & Plan Note (Signed)
Continue with Crestor and aspirin

## 2020-03-20 ENCOUNTER — Telehealth: Payer: Self-pay | Admitting: Internal Medicine

## 2020-03-20 NOTE — Telephone Encounter (Signed)
Notified pt MD ok chest xray.. Pt states the symptoms are much better now and will hold off on getting xray, but she did states she has a cyst or something on her (L) side of her forehead. She states it hurt really bad. Inform pt she we need an ov so provider can see it and evaluate. Dr. Camila Li nest available is not until next Wednesday 16th, but made appt to see dr. Jenny Reichmann tomorrow.Marland KitchenJohny Chess

## 2020-03-20 NOTE — Telephone Encounter (Signed)
Patient calling to get her lab results from 02.03.22 and her CT results from 02.04.22 [phone]

## 2020-03-20 NOTE — Telephone Encounter (Signed)
See result encounter pt has been notified w/results.Marland KitchenJohny [name]

## 2020-03-21 ENCOUNTER — Ambulatory Visit (INDEPENDENT_AMBULATORY_CARE_PROVIDER_SITE_OTHER): Payer: Medicare Other | Admitting: Internal Medicine

## 2020-03-21 ENCOUNTER — Ambulatory Visit: Payer: Medicare Other | Admitting: Internal Medicine

## 2020-03-21 ENCOUNTER — Other Ambulatory Visit: Payer: Self-pay

## 2020-03-21 ENCOUNTER — Encounter: Payer: Self-pay | Admitting: Internal Medicine

## 2020-03-21 DIAGNOSIS — L989 Disorder of the skin and subcutaneous tissue, unspecified: Secondary | ICD-10-CM | POA: Insufficient documentation

## 2020-03-21 DIAGNOSIS — I10 Essential (primary) hypertension: Secondary | ICD-10-CM

## 2020-03-21 DIAGNOSIS — I679 Cerebrovascular disease, unspecified: Secondary | ICD-10-CM

## 2020-03-21 NOTE — Progress Notes (Signed)
Patient ID: [name] [name], female   DOB: 1941/01/31, 80 y.o.   MRN: [id number]        Chief Complaint: left head scalp lesion       HPI:  [name] [name] is a 80 y.o. female here with c/o small raised tender spot to left head above the ear, without fever, pain , drainage ; also asks to review the recent CT head;  Pt denies new neurological symptoms such as new headache, or facial or extremity weakness or numbness   Pt denies polydipsia, polyuria,  Pt denies chest pain, increased sob or doe, wheezing, orthopnea, PND, increased LE swelling, palpitations, dizziness or syncope.   .        Wt Readings from Last 3 Encounters:  03/21/20 148 lb (67.1 kg)  03/14/20 148 lb (67.1 kg)  02/02/20 148 lb 1.6 oz (67.2 kg)   BP Readings from Last 3 Encounters:  03/21/20 120/74  03/14/20 118/72  02/02/20 128/87         Past Medical History:  Diagnosis Date  . Allergic rhinitis   . Anxiety   . Breast cancer ([place]) hx 2004   recurrent 2006 DR Magrinat  . Family history of breast cancer   . 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 HTN (hypertension)   . Hyperlipidemia   . Hypothyroidism   . Personal history of chemotherapy 2002  . Personal history of radiation therapy 2002  . Psoriasis   . S/P thyroidectomy 07/2005   2 cm largest diameter (1 other tiny focus)/ i-131 rx 99 [facility] 08/2005  . Thyroid cancer ([place])    Papillary Stage 1 - Dr Loanne Drilling  . Vitamin B12 deficiency 2009  . Vitamin D deficiency 2009   Past Surgical History:  Procedure Laterality Date  . BREAST LUMPECTOMY    . BREAST LUMPECTOMY WITH RADIOACTIVE SEED AND SENTINEL LYMPH NODE BIOPSY Left 09/11/2016   Procedure: LEFT BREAST LUMPECTOMY WITH RADIOACTIVE SEED AND LEFT AXILLARY SENTINEL LYMPH NODE BIOPSY WITH BLUE DYE INJECTION;  Surgeon: [name], MD;  Location: [place];  Service: General;  Laterality: Left;  . IR FLUORO GUIDE PORT  INSERTION RIGHT  09/13/2016  . IR REMOVAL TUN ACCESS W/ PORT W/O FL MOD SED  12/30/2016  . IR US GUIDE VASC ACCESS RIGHT  09/13/2016  . MASTECTOMY     Right  . PORTACATH PLACEMENT N/A 09/11/2016   Procedure: ATTEMPTED INSERTION PORT-A-CATH WITH ULTRA SOUND GUIDANCE;  Surgeon: [name], MD;  Location: [place];  Service: General;  Laterality: N/A;  . REDUCTION MAMMAPLASTY Left 2005  . THYROIDECTOMY  2007    reports that she has never smoked. She has never used smokeless tobacco. She reports that she does not drink alcohol and does not use drugs. family history includes Allergies in her daughter, mother, sister, and sister; Asthma in her brother, daughter, mother, and sister; Breast cancer (age of onset: 26) in her sister; Breast cancer (age of onset: 70) in her sister; Cancer in her paternal aunt; Clotting disorder in her mother; Heart disease (age of onset: 98) in her father; Kidney cancer in her paternal aunt; Leukemia in her brother; Liver cancer in her paternal uncle; Lung cancer in her maternal grandfather; Lymphoma in her sister; Pancreatic cancer in her paternal grandfather; Stroke in her mother; Throat cancer in her maternal grandmother. Allergies  Allergen Reactions  . Pneumococcal Vaccines Other (See Comments)  Was really sick   . Sulfa Antibiotics Other (See Comments)    Pt believes it was hives  . Sulfacetamide Sodium-Sulfur   . Tape Itching, Dermatitis and Rash  . Tegaderm Ag Mesh [Silver] Itching and Rash   Current Outpatient Medications on File Prior to Visit  Medication Sig Dispense Refill  . ALPRAZolam (XANAX) 1 MG tablet TAKE 0.5-1 TABLETS (0.5-1 MG TOTAL) BY MOUTH 2 (TWO) TIMES DAILY AS NEEDED FOR ANXIETY. 60 tablet 1  . anastrozole (ARIMIDEX) 1 MG tablet TAKE 1 TABLET BY MOUTH EVERY DAY 90 tablet 4  . cholecalciferol (VITAMIN D) 1000 units tablet Take 2 tablets (2,000 Units total) by mouth daily. 100 tablet 3  . levothyroxine (SYNTHROID) 112 MCG  tablet TAKE 1 TABLET (112 MCG TOTAL) BY MOUTH DAILY. 90 tablet 2  . meclizine (ANTIVERT) 12.5 MG tablet 1-2 tab by mouth every 8 hrs as needed 40 tablet 1  . telmisartan (MICARDIS) 40 MG tablet TAKE 1 TABLET BY MOUTH EVERY DAY 90 tablet 0  . vitamin B-12 (CYANOCOBALAMIN) 1000 MCG tablet Take 1,000 mcg by mouth every other day.    . zinc gluconate 50 MG tablet Take 50 mg by mouth daily.     No current facility-administered medications on file prior to visit.        ROS:  All others reviewed and negative.  Objective        PE:  BP 120/74   Pulse 98   Temp 97.9 F (36.6 C) (Oral)   Ht 5\' 3"  (1.6 m)   Wt 148 lb (67.1 kg)   SpO2 96%   BMI 26.22 kg/m                 Constitutional: Pt appears in NAD               HENT: Head: NCAT.                Right Ear: External ear normal.                 Left Ear: External ear normal.                Eyes: . Pupils are equal, round, and reactive to light. Conjunctivae and EOM are normal               Nose: without d/c or deformity               Neck: Neck supple. Gross normal ROM               Cardiovascular: Normal rate and regular rhythm.                 Pulmonary/Chest: Effort normal and breath sounds without rales                Neurological: Pt is alert. At baseline orientation, motor grossly intact               Skin: Skin is warm. No rashes, no other new lesions, LE edema - none; left scalp with small 10 mm are with central erythema with minor tender               Psychiatric: Pt behavior is normal without agitation   Micro: none  Cardiac tracings I have personally interpreted today:  none  Pertinent Radiological findings (summarize): Mar 17 2020 CT Head Wo Contrast  - summary Patchy and confluent areas of decreased attenuation are noted throughout the deep  and periventricular white matter of the cerebral hemispheres bilaterally, compatible with chronic microvascular ischemic disease IMPRESSION: No acute intracranial abnormality.    Lab Results  Component Value Date   WBC 6.1 02/02/2020   HGB 12.7 02/02/2020   HCT 39.8 02/02/2020   PLT 337 02/02/2020   GLUCOSE 120 (H) 03/14/2020   CHOL 184 10/20/2019   TRIG 420 (H) 10/20/2019   HDL 45 (L) 10/20/2019   LDLDIRECT 125.0 06/05/2015   LDLCALC  10/20/2019     Comment:     . LDL cholesterol not calculated. Triglyceride levels greater than 400 mg/dL invalidate calculated LDL results. . Reference range: <100 . Desirable range <100 mg/dL for primary prevention;   <70 mg/dL for patients with CHD or diabetic patients  with > or = 2 CHD risk factors. [name] LDL-C is now calculated using the Martin-Hopkins  calculation, which is a validated novel method providing  better accuracy than the Friedewald equation in the  estimation of LDL-C.  Cresenciano Genre et al. Annamaria Helling. 5409;811(91): 2061-2068  (http://education.[facility].com/faq/FAQ164)    ALT 16 03/14/2020   AST 23 03/14/2020   NA 137 03/14/2020   K 4.5 03/14/2020   CL 102 03/14/2020   CREATININE 1.33 (H) 03/14/2020   BUN 22 03/14/2020   CO2 27 03/14/2020   TSH 0.87 03/14/2020   INR 0.98 12/30/2016   HGBA1C 6.4 03/14/2020   Assessment/Plan:  SAVAHNA CASADOS is a 80 y.o. White or Caucasian [1] female with  has a past medical history of Allergic rhinitis, Anxiety, Breast cancer ([place]) (hx 2004), Family history of breast cancer, Genetic testing (12/05/2016), HTN (hypertension), Hyperlipidemia, Hypothyroidism, Personal history of chemotherapy (2002), Personal history of radiation therapy (2002), Psoriasis, S/P thyroidectomy (07/2005), Thyroid cancer ([place]), Vitamin B12 deficiency (2009), and Vitamin D deficiency (2009).  Skin lesion I suspect insect bite area, d/w pt, no specific tx needed  Cerebrovascular disease D/w pt, no specific tx,  to f/u any worsening symptoms or concerns  Essential hypertension BP Readings from Last 3 Encounters:  03/21/20 120/74  03/14/20 118/72  02/02/20 128/87   Stable, pt to  continue medical treatment micardis  Current Outpatient Medications (Endocrine & Metabolic):  .  levothyroxine (SYNTHROID) 112 MCG tablet, TAKE 1 TABLET (112 MCG TOTAL) BY MOUTH DAILY.  Current Outpatient Medications (Cardiovascular):  .  telmisartan (MICARDIS) 40 MG tablet, TAKE 1 TABLET BY MOUTH EVERY DAY    Current Outpatient Medications (Hematological):  .  vitamin B-12 (CYANOCOBALAMIN) 1000 MCG tablet, Take 1,000 mcg by mouth every other day.  Current Outpatient Medications (Other):  [name]  ALPRAZolam (XANAX) 1 MG tablet, TAKE 0.5-1 TABLETS (0.5-1 MG TOTAL) BY MOUTH 2 (TWO) TIMES DAILY AS NEEDED FOR ANXIETY. [name]  anastrozole (ARIMIDEX) 1 MG tablet, TAKE 1 TABLET BY MOUTH EVERY DAY .  cholecalciferol (VITAMIN D) 1000 units tablet, Take 2 tablets (2,000 Units total) by mouth daily. .  meclizine (ANTIVERT) 12.5 MG tablet, 1-2 tab by mouth every 8 hrs as needed .  zinc gluconate 50 MG tablet, Take 50 mg by mouth daily.   Followup: Return if symptoms worsen or fail to improve.  Cathlean Cower, MD 03/21/2020 8:36 PM [facility]

## 2020-03-21 NOTE — Assessment & Plan Note (Signed)
I suspect insect bite area, d/w pt, no specific tx needed

## 2020-03-21 NOTE — Assessment & Plan Note (Signed)
D/w pt, no specific tx,  to f/u any worsening symptoms or concerns

## 2020-03-21 NOTE — Assessment & Plan Note (Signed)
BP Readings from Last 3 Encounters:  03/21/20 120/74  03/14/20 118/72  02/02/20 128/87   Stable, pt to continue medical treatment micardis  Current Outpatient Medications (Endocrine & Metabolic):  .  levothyroxine (SYNTHROID) 112 MCG tablet, TAKE 1 TABLET (112 MCG TOTAL) BY MOUTH DAILY.  Current Outpatient Medications (Cardiovascular):  .  telmisartan (MICARDIS) 40 MG tablet, TAKE 1 TABLET BY MOUTH EVERY DAY    Current Outpatient Medications (Hematological):  .  vitamin B-12 (CYANOCOBALAMIN) 1000 MCG tablet, Take 1,000 mcg by mouth every other day.  Current Outpatient Medications (Other):  [name]  ALPRAZolam (XANAX) 1 MG tablet, TAKE 0.5-1 TABLETS (0.5-1 MG TOTAL) BY MOUTH 2 (TWO) TIMES DAILY AS NEEDED FOR ANXIETY. [name]  anastrozole (ARIMIDEX) 1 MG tablet, TAKE 1 TABLET BY MOUTH EVERY DAY .  cholecalciferol (VITAMIN D) 1000 units tablet, Take 2 tablets (2,000 Units total) by mouth daily. .  meclizine (ANTIVERT) 12.5 MG tablet, 1-2 tab by mouth every 8 hrs as needed .  zinc gluconate 50 MG tablet, Take 50 mg by mouth daily.

## 2020-03-21 NOTE — Patient Instructions (Signed)
Please continue all other medications as before, and refills have been done if requested.  Please have the pharmacy call with any other refills you may need.  Please continue your efforts at being more active, low cholesterol diet, and weight control.  Please keep your appointments with your specialists as you may have planned     

## 2020-03-28 ENCOUNTER — Telehealth: Payer: Self-pay | Admitting: Internal Medicine

## 2020-03-28 DIAGNOSIS — L989 Disorder of the skin and subcutaneous tissue, unspecified: Secondary | ICD-10-CM

## 2020-03-28 NOTE — Telephone Encounter (Signed)
Ok this is done 

## 2020-03-28 NOTE — Telephone Encounter (Signed)
Patient called and was wondering if a referral for a dermatologist could be placed. Please advise. She seen Dr. Jenny Reichmann on 2.8.22 for the issue. She can be reached at [phone].

## 2020-04-03 DIAGNOSIS — H2513 Age-related nuclear cataract, bilateral: Secondary | ICD-10-CM | POA: Diagnosis not present

## 2020-04-03 DIAGNOSIS — H40033 Anatomical narrow angle, bilateral: Secondary | ICD-10-CM | POA: Diagnosis not present

## 2020-04-04 DIAGNOSIS — L853 Xerosis cutis: Secondary | ICD-10-CM | POA: Diagnosis not present

## 2020-04-04 DIAGNOSIS — L538 Other specified erythematous conditions: Secondary | ICD-10-CM | POA: Diagnosis not present

## 2020-04-04 DIAGNOSIS — L821 Other seborrheic keratosis: Secondary | ICD-10-CM | POA: Diagnosis not present

## 2020-04-26 ENCOUNTER — Ambulatory Visit: Payer: Medicare Other | Admitting: Endocrinology

## 2020-05-01 ENCOUNTER — Other Ambulatory Visit: Payer: Self-pay

## 2020-05-02 ENCOUNTER — Encounter: Payer: Self-pay | Admitting: Internal Medicine

## 2020-05-02 ENCOUNTER — Ambulatory Visit (INDEPENDENT_AMBULATORY_CARE_PROVIDER_SITE_OTHER): Payer: Medicare Other | Admitting: Internal Medicine

## 2020-05-02 VITALS — BP 130/82 | HR 91 | Temp 98.0°F | Ht 63.0 in | Wt 149.4 lb

## 2020-05-02 DIAGNOSIS — Z23 Encounter for immunization: Secondary | ICD-10-CM

## 2020-05-02 DIAGNOSIS — E538 Deficiency of other specified B group vitamins: Secondary | ICD-10-CM

## 2020-05-02 DIAGNOSIS — I10 Essential (primary) hypertension: Secondary | ICD-10-CM

## 2020-05-02 DIAGNOSIS — F419 Anxiety disorder, unspecified: Secondary | ICD-10-CM

## 2020-05-02 DIAGNOSIS — E559 Vitamin D deficiency, unspecified: Secondary | ICD-10-CM | POA: Diagnosis not present

## 2020-05-02 NOTE — Assessment & Plan Note (Signed)
On B12 

## 2020-05-02 NOTE — Progress Notes (Signed)
Subjective:  Patient ID: [name] [name], female    DOB: [birth year]  Age: 80 y.o. MRN: [id number]  CC: Follow-up (6 week f/u)   HPI [name] [name] presents for HTN, B12 def, dyslipidemia f/u  Outpatient Medications Prior to Visit  Medication Sig Dispense Refill  . ALPRAZolam (XANAX) 1 MG tablet TAKE 0.5-1 TABLETS (0.5-1 MG TOTAL) BY MOUTH 2 (TWO) TIMES DAILY AS NEEDED FOR ANXIETY. 60 tablet 1  . anastrozole (ARIMIDEX) 1 MG tablet TAKE 1 TABLET BY MOUTH EVERY DAY 90 tablet 4  . cholecalciferol (VITAMIN D) 1000 units tablet Take 2 tablets (2,000 Units total) by mouth daily. 100 tablet 3  . levothyroxine (SYNTHROID) 112 MCG tablet TAKE 1 TABLET (112 MCG TOTAL) BY MOUTH DAILY. 90 tablet 2  . meclizine (ANTIVERT) 12.5 MG tablet 1-2 tab by mouth every 8 hrs as needed 40 tablet 1  . telmisartan (MICARDIS) 40 MG tablet TAKE 1 TABLET BY MOUTH EVERY DAY 90 tablet 0  . vitamin B-12 (CYANOCOBALAMIN) 1000 MCG tablet Take 1,000 mcg by mouth every other day.    . zinc gluconate 50 MG tablet Take 50 mg by mouth daily. (Patient not taking: Reported on 05/02/2020)     No facility-administered medications prior to visit.    ROS: Review of Systems  Constitutional: Negative for activity change, appetite change, chills, fatigue and unexpected weight change.  HENT: Negative for congestion, mouth sores and sinus pressure.   Eyes: Negative for visual disturbance.  Respiratory: Negative for cough and chest tightness.   Gastrointestinal: Negative for abdominal pain and nausea.  Genitourinary: Negative for difficulty urinating, frequency and vaginal pain.  Musculoskeletal: Negative for back pain and gait problem.  Skin: Negative for pallor and rash.  Neurological: Negative for dizziness, tremors, weakness, numbness and headaches.  Psychiatric/Behavioral: Negative for confusion, sleep disturbance and suicidal ideas. The patient is nervous/anxious.     Objective:  BP 130/82 (BP Location: Left Arm)   Pulse  91   Temp 98 F (36.7 C) (Oral)   Ht 5\' 3"  (1.6 m)   Wt 149 lb 6.4 oz (67.8 kg)   SpO2 95%   BMI 26.47 kg/m   BP Readings from Last 3 Encounters:  05/02/20 130/82  03/21/20 120/74  03/14/20 118/72    Wt Readings from Last 3 Encounters:  05/02/20 149 lb 6.4 oz (67.8 kg)  03/21/20 148 lb (67.1 kg)  03/14/20 148 lb (67.1 kg)    Physical Exam Constitutional:      General: She is not in acute distress.    Appearance: She is well-developed.  HENT:     Head: Normocephalic.     Right Ear: External ear normal.     Left Ear: External ear normal.     Nose: Nose normal.  Eyes:     General:        Right eye: No discharge.        Left eye: No discharge.     Conjunctiva/sclera: Conjunctivae normal.     Pupils: Pupils are equal, round, and reactive to light.  Neck:     Thyroid: No thyromegaly.     Vascular: No JVD.     Trachea: No tracheal deviation.  Cardiovascular:     Rate and Rhythm: Normal rate and regular rhythm.     Heart sounds: Normal heart sounds.  Pulmonary:     Effort: No respiratory distress.     Breath sounds: No stridor. No wheezing.  Abdominal:     General: Bowel sounds are  normal. There is no distension.     Palpations: Abdomen is soft. There is no mass.     Tenderness: There is no abdominal tenderness. There is no guarding or rebound.  Musculoskeletal:        General: No tenderness.     Cervical back: Normal range of motion and neck supple.  Lymphadenopathy:     Cervical: No cervical adenopathy.  Skin:    Findings: No erythema or rash.  Neurological:     Cranial Nerves: No cranial nerve deficit.     Motor: No abnormal muscle tone.     Coordination: Coordination normal.     Deep Tendon Reflexes: Reflexes normal.  Psychiatric:        Behavior: Behavior normal.        Thought Content: Thought content normal.        Judgment: Judgment normal.      Lab Results  Component Value Date   WBC 6.1 02/02/2020   HGB 12.7 02/02/2020   HCT 39.8 02/02/2020    PLT 337 02/02/2020   GLUCOSE 120 (H) 03/14/2020   CHOL 184 10/20/2019   TRIG 420 (H) 10/20/2019   HDL 45 (L) 10/20/2019   LDLDIRECT 125.0 06/05/2015   [place]  10/20/2019     Comment:     . LDL cholesterol not calculated. Triglyceride levels greater than 400 mg/dL invalidate calculated LDL results. . Reference range: <100 . Desirable range <100 mg/dL for primary prevention;   <70 mg/dL for patients with CHD or diabetic patients  with > or = 2 CHD risk factors. [name] LDL-C is now calculated using the Martin-Hopkins  calculation, which is a validated novel method providing  better accuracy than the Friedewald equation in the  estimation of LDL-C.  Cresenciano Genre et al. Annamaria Helling. 0623;762(83): 2061-2068  (http://education.[facility].com/faq/FAQ164)    ALT 16 03/14/2020   AST 23 03/14/2020   NA 137 03/14/2020   K 4.5 03/14/2020   CL 102 03/14/2020   CREATININE 1.33 (H) 03/14/2020   BUN 22 03/14/2020   CO2 27 03/14/2020   TSH 0.87 03/14/2020   INR 0.98 12/30/2016   HGBA1C 6.4 03/14/2020    CT Head Wo Contrast  Result Date: 03/17/2020 CLINICAL DATA:  Dizziness, nonspecific.  Ataxia, fall. EXAM: CT HEAD WITHOUT CONTRAST TECHNIQUE: Contiguous axial images were obtained from the base of the skull through the vertex without intravenous contrast. COMPARISON:  CT head 11/19/2007 FINDINGS: Brain: Cerebral ventricle sizes are concordant with the degree of cerebral volume loss. Patchy and confluent areas of decreased attenuation are noted throughout the deep and periventricular white matter of the cerebral hemispheres bilaterally, compatible with chronic microvascular ischemic disease. No evidence of large-territorial acute infarction. No parenchymal hemorrhage. No mass lesion. No extra-axial collection. No mass effect or midline shift. No hydrocephalus. Basilar cisterns are patent. Vascular: No hyperdense vessel. Skull: No acute fracture or focal lesion. Sinuses/Orbits: Paranasal sinuses and  mastoid air cells are clear. The orbits are unremarkable. Other: None. IMPRESSION: No acute intracranial abnormality. Electronically Signed   By: Iven Finn M.D.   On: 03/17/2020 20:21    Assessment & Plan:    Walker Kehr, MD

## 2020-05-02 NOTE — Assessment & Plan Note (Signed)
Xanax prn  Potential benefits of a long term benzodiazepines  use as well as potential risks  and complications were explained to the patient and were aknowledged. 

## 2020-05-02 NOTE — Assessment & Plan Note (Signed)
On Losartan now

## 2020-05-02 NOTE — Assessment & Plan Note (Signed)
On Vit D 

## 2020-05-15 ENCOUNTER — Other Ambulatory Visit: Payer: Self-pay | Admitting: Internal Medicine

## 2020-06-13 ENCOUNTER — Ambulatory Visit (INDEPENDENT_AMBULATORY_CARE_PROVIDER_SITE_OTHER): Payer: Medicare Other | Admitting: Endocrinology

## 2020-06-13 ENCOUNTER — Encounter: Payer: Self-pay | Admitting: Endocrinology

## 2020-06-13 VITALS — BP 108/70 | HR 108 | Ht 63.0 in | Wt 147.2 lb

## 2020-06-13 DIAGNOSIS — M818 Other osteoporosis without current pathological fracture: Secondary | ICD-10-CM

## 2020-06-13 DIAGNOSIS — E213 Hyperparathyroidism, unspecified: Secondary | ICD-10-CM

## 2020-06-13 NOTE — Progress Notes (Signed)
Subjective:    Patient ID: [name] [name], female    DOB: 1940/12/21, 80 y.o.   MRN: [id number]  HPI Pt is referred by Dr Alain Marion, for hypercalcemia.  Pt was noted to have hypercalcemia in 2021.  she has never had urolithiasis, sarcoidosis, PUD, or pancreatitis.  only bony fracture was left thumb (1975--traumatic).  she does not take vitamin-A supplement.   Pt denies taking antacids, Li++, or HCTZ.  She takes Prolia for osteoporosis since 2013.  She takes anastrazole for breast cancer.   Past Medical History:  Diagnosis Date  . Allergic rhinitis   . Anxiety   . Breast cancer ([facility]) hx 2004   recurrent 2006 DR Magrinat  . Family history of breast cancer   . 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 HTN (hypertension)   . Hyperlipidemia   . Hypothyroidism   . Personal history of chemotherapy 2002  . Personal history of radiation therapy 2002  . Psoriasis   . S/P thyroidectomy 07/2005   2 cm largest diameter (1 other tiny focus)/ i-131 rx 99 [facility] 08/2005  . Thyroid cancer ([facility])    Papillary Stage 1 - Dr Loanne Drilling  . Vitamin B12 deficiency 2009  . Vitamin D deficiency 2009    Past Surgical History:  Procedure Laterality Date  . BREAST LUMPECTOMY    . BREAST LUMPECTOMY WITH RADIOACTIVE SEED AND SENTINEL LYMPH NODE BIOPSY Left 09/11/2016   Procedure: LEFT BREAST LUMPECTOMY WITH RADIOACTIVE SEED AND LEFT AXILLARY SENTINEL LYMPH NODE BIOPSY WITH BLUE DYE INJECTION;  Surgeon: [name], MD;  Location: [facility];  Service: General;  Laterality: Left;  . IR FLUORO GUIDE PORT INSERTION RIGHT  09/13/2016  . IR REMOVAL TUN ACCESS W/ PORT W/O FL MOD SED  12/30/2016  . IR US GUIDE VASC ACCESS RIGHT  09/13/2016  . MASTECTOMY     Right  . PORTACATH PLACEMENT N/A 09/11/2016   Procedure: ATTEMPTED INSERTION PORT-A-CATH WITH ULTRA SOUND GUIDANCE;  Surgeon: [name], MD;  Location: [facility];   Service: General;  Laterality: N/A;  . REDUCTION MAMMAPLASTY Left 2005  . THYROIDECTOMY  2007    Social History   Socioeconomic History  . Marital status: Married    Spouse name: Not on file  . Number of children: 2  . Years of education: Not on file  . Highest education level: Not on file  Occupational History  . 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 Smoking status: Never Smoker  . Smokeless tobacco: Never Used  Substance and Sexual Activity  . Alcohol use: No  . Drug use: No  . Sexual activity: Not Currently  Other Topics Concern  . Not on file  Social History Narrative   GI - Dr Earlean Shawl   Depression - Dr Toy Care   GYN - Dr Marianna Payment      Social Determinants of Health   Financial Resource Strain: Not on file  Food Insecurity: Not on file  Transportation Needs: Not on file  Physical Activity: Not on file  Stress: Not on file  Social Connections: Not on file  Intimate Partner Violence: Not on file    Current Outpatient Medications on File Prior to Visit  Medication Sig Dispense Refill  . ALPRAZolam (XANAX) 1 MG tablet TAKE 0.5-1 TABLETS (0.5-1 MG TOTAL) BY MOUTH 2 (TWO) TIMES DAILY AS  NEEDED FOR ANXIETY. 60 tablet 1  . anastrozole (ARIMIDEX) 1 MG tablet TAKE 1 TABLET BY MOUTH EVERY DAY 90 tablet 4  . cholecalciferol (VITAMIN D) 1000 units tablet Take 2 tablets (2,000 Units total) by mouth daily. 100 tablet 3  . levothyroxine (SYNTHROID) 112 MCG tablet TAKE 1 TABLET (112 MCG TOTAL) BY MOUTH DAILY. 90 tablet 2  . meclizine (ANTIVERT) 12.5 MG tablet 1-2 tab by mouth every 8 hrs as needed 40 tablet 1  . rosuvastatin (CRESTOR) 20 MG tablet AS DIRECTED 90 tablet 1  . telmisartan (MICARDIS) 40 MG tablet TAKE 1 TABLET BY MOUTH EVERY DAY 90 tablet 0  . vitamin B-12 (CYANOCOBALAMIN) 1000 MCG tablet Take 1,000 mcg by mouth every other day.     No current facility-administered medications on file prior to visit.     Allergies  Allergen Reactions  . Pneumococcal Vaccines Other (See Comments)    Was really sick   . Sulfa Antibiotics Other (See Comments)    Pt believes it was hives  . Sulfacetamide Sodium-Sulfur   . Tape Itching, Dermatitis and Rash  . Tegaderm Ag Mesh [Silver] Itching and Rash    Family History  Problem Relation Age of Onset  . Stroke Mother   . Allergies Mother   . Asthma Mother   . Clotting disorder Mother   . Heart disease Father 95       MI  . Allergies Sister   . Asthma Sister   . Breast cancer Sister 10       Lymphoma 50; currently 62  . Lymphoma Sister   . Hyperparathyroidism Sister   . Asthma Brother   . Leukemia Brother        dx 8s  . Allergies Daughter   . Asthma Daughter   . Allergies Sister   . Breast cancer Sister 10       currently 25  . Kidney cancer Paternal Aunt        kidney ca; deceased 40  . Liver cancer Paternal Uncle        unk. primary ("liver")  . Throat cancer Maternal Grandmother        deceased 66  . Lung cancer Maternal Grandfather        deceased 60  . Pancreatic cancer Paternal Grandfather        deceased 1  . Cancer Paternal Aunt        "abdominal"; deceased 3    BP 108/70 (BP Location: Right Arm, Patient Position: Sitting, Cuff Size: Normal)   Pulse (!) 108   Ht _0  (1.6 m)   Wt 147 lb 3.2 oz (66.8 kg)   SpO2 96%   BMI 26.08 kg/m     Review of Systems No change in chronic depression.  She has fatigue.  Denies weight loss, polyuria, hematuria, numbness, heartburn, and back pain.      Objective:   Physical Exam VITAL SIGNS:  See vs page GENERAL: no distress Neck: a healed scar is present.  I do not appreciate a nodule in the thyroid or elsewhere in the neck.   SPINE: no kyphosis.   GAIT: normal and steady.   DEXA (2019): BMD measured at Femur Total Right is 0.580 g/cm2 with a T-score of -3.4.    Lab Results  Component Value Date   PTH 81 (H) 03/14/2020   CALCIUM 10.5 (H) 03/14/2020   CALCIUM 10.6  (H) 03/14/2020   Lab Results  Component Value Date   TSH 0.87 03/14/2020  Lab Results  Component Value Date   ALT 16 03/14/2020   AST 23 03/14/2020   ALKPHOS 81 03/14/2020   BILITOT 0.4 03/14/2020   I have reviewed outside records, and summarized: Pt was noted to have elevated Ca++, and referred here.   She had thyroidect in 2007, stage-1 PTC-FV      Assessment & Plan:  Hyperparathyroidism, prob primary.   Osteoporosis, due for recheck  Patient Instructions  Blood tests are requested for you today.  We'll let you know about the results.  Please call [phone] to schedule a Bone Density (DexaScan) a the [place] office at [place]. If necessary, we'll add a once a month pill, for the osteoporosis.   Please come back for a follow-up appointment in 4 months.

## 2020-06-13 NOTE — Patient Instructions (Addendum)
Blood tests are requested for you today.  We'll let you know about the results.  Please call [phone] to schedule a Bone Density (DexaScan) a the [place] office at [place]. If necessary, we'll add a once a month pill, for the osteoporosis.   Please come back for a follow-up appointment in 4 months.

## 2020-06-14 LAB — VITAMIN D 25 HYDROXY (VIT D DEFICIENCY, FRACTURES): VITD: 46.05 ng/mL (ref 30.00–100.00)

## 2020-06-15 ENCOUNTER — Telehealth: Payer: Self-pay | Admitting: Endocrinology

## 2020-06-15 NOTE — Telephone Encounter (Signed)
Patient requests to be called at ph# [phone] to be given her recent lab results. Patient states she has been having problems getting on MyChart.

## 2020-06-15 NOTE — Telephone Encounter (Signed)
I will advise you when results are available.

## 2020-06-16 NOTE — Telephone Encounter (Signed)
Spoke with pt to let her know that once results come back she will be notified.

## 2020-06-20 LAB — VITAMIN A: Vitamin A (Retinoic Acid): 54 ug/dL (ref 38–98)

## 2020-06-20 LAB — PTH, INTACT AND CALCIUM
Calcium: 9.9 mg/dL (ref 8.6–10.4)
PTH: 80 pg/mL — ABNORMAL HIGH (ref 16–77)

## 2020-06-20 LAB — VITAMIN D 1,25 DIHYDROXY
Vitamin D 1, 25 (OH)2 Total: 63 pg/mL (ref 18–72)
Vitamin D2 1, 25 (OH)2: <8 pg/mL
Vitamin D3 1, 25 (OH)2: 63 pg/mL

## 2020-06-20 LAB — PROLACTIN: Prolactin: 20.6 ng/mL

## 2020-06-20 LAB — PTH-RELATED PEPTIDE: PTH-Related Protein (PTH-RP): 14 pg/mL (ref 11–20)

## 2020-06-26 ENCOUNTER — Other Ambulatory Visit: Payer: Self-pay | Admitting: Internal Medicine

## 2020-06-26 DIAGNOSIS — Z1231 Encounter for screening mammogram for malignant neoplasm of breast: Secondary | ICD-10-CM

## 2020-06-26 NOTE — Telephone Encounter (Signed)
Patient called again asking if we have gotten her other results. Ph# [phone]

## 2020-06-28 NOTE — Telephone Encounter (Signed)
Called and spoke with pt to advise her of the results previously provided. Pt advised to Please call [phone] to schedule a Bone Density (DexaScan) a the [place] office at [place].

## 2020-06-28 NOTE — Telephone Encounter (Signed)
Patient called again to request to be called at ph# [phone] to be given her lab results. Patient states she is not feeling and would like to know her lab results.

## 2020-08-01 ENCOUNTER — Ambulatory Visit: Payer: Medicare Other

## 2020-08-01 DIAGNOSIS — Z20822 Contact with and (suspected) exposure to covid-19: Secondary | ICD-10-CM | POA: Diagnosis not present

## 2020-08-02 ENCOUNTER — Ambulatory Visit: Payer: Medicare Other

## 2020-08-02 NOTE — Progress Notes (Deleted)
. [name]   DOB: 01/22/1941  MR#: [id number]  CSN#:[id number]  Patient Care Team: [name], MD as PCP - General Magrinat, [name], MD as Consulting Physician (Oncology) [name], MD as Consulting Physician (General Surgery) [name], MD as Consulting Physician (Gastroenterology) [name], MD as Consulting Physician (Radiation Oncology)   CHIEF COMPLAINT: Weakly estrogen receptor positive breast cancer (s/p right mastectomy)  CURRENT TREATMENT: Anastrozole, Prolia   INTERVAL HISTORY: [name] [name] returns for follow-up of her estrogen receptor weakly positive breast cancer.  Her most recent mammogram was completed on 08/02/2020.    She continues on anastrozole with good tolerance.  Hot flashes and vaginal dryness are not issues for her  Tabbitha's last bone density screening on 05/06/2017, showed a T-score of -3.4, which is considered osteoporotic.  She is on denosumab/Prolia, and receives this every 6 months.   REVIEW OF SYSTEMS: Review of Systems  Constitutional:  Negative for appetite change, chills, fatigue, fever and unexpected weight change.  HENT:   Negative for hearing loss, lump/mass and trouble swallowing.   Eyes:  Negative for eye problems and icterus.  Respiratory:  Negative for chest tightness, cough and shortness of breath.   Cardiovascular:  Negative for chest pain, leg swelling and palpitations.  Gastrointestinal:  Negative for abdominal distention, abdominal pain, constipation, diarrhea, nausea and vomiting.  Endocrine: Negative for hot flashes.  Genitourinary:  Negative for difficulty urinating.   Musculoskeletal:  Negative for arthralgias.  Skin:  Negative for itching and rash.  Neurological:  Negative for dizziness, extremity weakness, headaches and numbness.  Hematological:  Negative for adenopathy. Does not bruise/bleed easily.  Psychiatric/Behavioral:  Negative for depression. The patient is not nervous/anxious.      COVID 19  VACCINATION STATUS: fully vaccinated [employer]), with booster 12/25/2019   BREAST CANCER HISTORY: From the recent summary note:  I last saw [name] [name] in 2013 when she was released from follow-up from her right breast cancer recurrence in 2004. She is status post right mastectomy and adjuvant chemotherapy for that triple negative tumor. More recently, on 07/16/2016 she underwent left screening mammography at the [facility] on 07/16/2016. This found the breast density to be category B. There was a possible asymmetry in the left breast, and on 07/19/2016 she underwent left diagnostic mammography with tomography and left breast ultrasonography. This confirmed a well circumscribed oval mass at the 9:00 position of the left breast measuring approximately 0.8 cm. This was not palpable. On ultrasonography the mass measured 0.6 cm and it was located at the 9:00 radiant 4 cm from the nipple. The left axilla was sonographically benign.  On June 11 [name] underwent biopsy of the left breast mass in question and this showed ([id number]) and invasive ductal carcinoma, grade 3, estrogen receptor 5% positive with weak staining intensity, progesterone receptor negative, with an MIB-1 of 80%, and HER-2 nonamplified, the signals ratio being 1.46 and the number per cell 1.90.  Her subsequent history is as detailed below.   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)   HTN (hypertension)    Hyperlipidemia    Hypothyroidism    Personal history of chemotherapy 2002   Personal history of radiation therapy 2002  Psoriasis    S/P thyroidectomy 07/2005   2 cm largest diameter (1 other tiny focus)/ i-131 rx 99 mci 08/2005   Thyroid cancer ([place])    Papillary Stage 1  - Dr Loanne Drilling   Vitamin B12 deficiency 2009   Vitamin D deficiency 2009    PAST SURGICAL HISTORY: Past Surgical History:  Procedure Laterality Date   BREAST LUMPECTOMY     BREAST LUMPECTOMY WITH RADIOACTIVE SEED AND SENTINEL LYMPH NODE BIOPSY Left 09/11/2016   Procedure: LEFT BREAST LUMPECTOMY WITH RADIOACTIVE SEED AND LEFT AXILLARY SENTINEL LYMPH NODE BIOPSY WITH BLUE DYE INJECTION;  Surgeon: [name], MD;  Location: [facility];  Service: General;  Laterality: Left;   IR FLUORO GUIDE PORT INSERTION RIGHT  09/13/2016   IR REMOVAL TUN ACCESS W/ PORT W/O FL MOD SED  12/30/2016   IR US GUIDE VASC ACCESS RIGHT  09/13/2016   MASTECTOMY     Right   PORTACATH PLACEMENT N/A 09/11/2016   Procedure: ATTEMPTED INSERTION PORT-A-CATH WITH ULTRA SOUND GUIDANCE;  Surgeon: [name], MD;  Location: [facility];  Service: General;  Laterality: N/A;   REDUCTION MAMMAPLASTY Left 2005   THYROIDECTOMY  2007    FAMILY HISTORY Family History  Problem Relation Age of Onset   Stroke Mother    Allergies Mother    Asthma Mother    Clotting disorder Mother    Heart disease Father 53       MI   Allergies Sister    Asthma Sister    Breast cancer Sister 70       Lymphoma 27; currently 19   Lymphoma Sister    Hyperparathyroidism Sister    Asthma Brother    Leukemia Brother        dx 11s   Allergies Daughter    Asthma Daughter    Allergies Sister    Breast cancer Sister 43       currently 71   Kidney cancer Paternal Aunt        kidney ca; deceased 44   Liver cancer Paternal Uncle        unk. primary ("liver")   Throat cancer Maternal Grandmother        deceased 52   Lung cancer Maternal Grandfather        deceased 49   Pancreatic cancer Paternal Grandfather        deceased 13   Cancer Paternal Aunt        "abdominal"; deceased 69  Family history includes a sister, a paternal aunt, and a paternal cousin with breast cancer all older then 62 at diagnosis   GYN  HISTORY: Menarche age 16, first live birth age 59. She is GX P3. She went through the change of life age 53. She did not take hormone replacement.   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 Her husband [name] is retired from [employer]. Her children are [name], who works for time [employer] in [place], and Hinckley, who lives in [place] and works for [employer]. The third child, [name], died in an automobile accident at age 90. The patient has one granddaughter. Her dog Baxter Hire, just passed away at 41 years old. 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    HEALTH MAINTENANCE: Social History   Tobacco Use   Smoking  status: Never   Smokeless tobacco: Never  Substance Use Topics   Alcohol use: No   Drug use: No     Colonoscopy:  PAP:  Bone density: 05/06/2017 showed a T score of -3.4 osteoporosis  Lipid panel:  Allergies  Allergen Reactions   Pneumococcal Vaccines Other (See Comments)    Was really sick    Sulfa Antibiotics Other (See Comments)    Pt believes it was hives   Sulfacetamide Sodium-Sulfur    Tape Itching, Dermatitis and Rash   Tegaderm Ag Mesh [Silver] Itching and Rash    Current Outpatient Medications  Medication Sig Dispense Refill   ALPRAZolam (XANAX) 1 MG tablet TAKE 0.5-1 TABLETS (0.5-1 MG TOTAL) BY MOUTH 2 (TWO) TIMES DAILY AS NEEDED FOR ANXIETY. 60 tablet 1   anastrozole (ARIMIDEX) 1 MG tablet TAKE 1 TABLET BY MOUTH EVERY DAY 90 tablet 4   cholecalciferol (VITAMIN D) 1000 units tablet Take 2 tablets (2,000 Units total) by mouth daily. 100 tablet 3   levothyroxine (SYNTHROID) 112 MCG tablet TAKE 1 TABLET (112 MCG TOTAL) BY MOUTH DAILY. 90 tablet 2   meclizine (ANTIVERT) 12.5 MG tablet 1-2 tab by mouth every 8 hrs as needed 40 tablet 1   rosuvastatin (CRESTOR) 20 MG tablet AS DIRECTED  90 tablet 1   telmisartan (MICARDIS) 40 MG tablet TAKE 1 TABLET BY MOUTH EVERY DAY 90 tablet 0   vitamin B-12 (CYANOCOBALAMIN) 1000 MCG tablet Take 1,000 mcg by mouth every other day.     No current facility-administered medications for this visit.    OBJECTIVE: White woman in no acute distress  There were no vitals filed for this visit.    There is no height or weight on file to calculate BMI.    ECOG FS:  GENERAL: Patient is a well appearing female in no acute distress HEENT:  Sclerae anicteric.  Oropharynx clear and moist. No ulcerations or evidence of oropharyngeal candidiasis. Neck is supple.  NODES:  No cervical, supraclavicular, or axillary lymphadenopathy palpated.  BREAST EXAM:  Deferred. LUNGS:  Clear to auscultation bilaterally.  No wheezes or rhonchi. HEART:  Regular rate and rhythm. No murmur appreciated. ABDOMEN:  Soft, nontender.  Positive, normoactive bowel sounds. No organomegaly palpated. MSK:  No focal spinal tenderness to palpation. Full range of motion bilaterally in the upper extremities. EXTREMITIES:  No peripheral edema.   SKIN:  Clear with no obvious rashes or skin changes. No nail dyscrasia. NEURO:  Nonfocal. Well oriented.  Appropriate affect.    LAB RESULTS: Lab Results  Component Value Date   WBC 6.1 02/02/2020   NEUTROABS 3.1 02/02/2020   HGB 12.7 02/02/2020   HCT 39.8 02/02/2020   MCV 93.2 02/02/2020   PLT 337 02/02/2020      Chemistry      Component Value Date/Time   NA 137 03/14/2020 1607   NA 139 12/11/2016 1017   K 4.5 03/14/2020 1607   K 3.7 12/11/2016 1017   CL 102 03/14/2020 1607   CL 104 10/09/2011 1403   CO2 27 03/14/2020 1607   CO2 22 12/11/2016 1017   BUN 22 03/14/2020 1607   BUN 16.9 12/11/2016 1017   CREATININE 1.33 (H) 03/14/2020 1607   CREATININE 1.74 (H) 10/20/2019 1634   CREATININE 1.2 (H) 12/11/2016 1017      Component Value Date/Time   CALCIUM 9.9 06/13/2020 1549   CALCIUM 9.0 12/11/2016 1017   ALKPHOS 81  03/14/2020 1607   ALKPHOS 139 12/11/2016 1017   AST 23   03/14/2020 1607   AST 65 (H) 12/11/2016 1017   ALT 16 03/14/2020 1607   ALT 51 12/11/2016 1017   BILITOT 0.4 03/14/2020 1607   BILITOT 0.42 12/11/2016 1017       Lab Results  Component Value Date   LABCA2 16 10/09/2011    No components found for: LABCA125  No results for input(s): INR in the last 168 hours.  Urinalysis    Component Value Date/Time   COLORURINE YELLOW 06/05/2015 1559   APPEARANCEUR CLEAR 06/05/2015 1559   LABSPEC 1.020 06/05/2015 1559   PHURINE 5.5 06/05/2015 1559   GLUCOSEU NEGATIVE 06/05/2015 1559   HGBUR NEGATIVE 06/05/2015 1559   BILIRUBINUR NEGATIVE 06/05/2015 1559   KETONESUR NEGATIVE 06/05/2015 1559   UROBILINOGEN 0.2 06/05/2015 1559   NITRITE NEGATIVE 06/05/2015 1559   LEUKOCYTESUR MODERATE (A) 06/05/2015 1559    STUDIES: No results found.   ASSESSMENT: 80 y.o. [place] woman   (1) status post right lumpectomy and sentinel lymph node biopsy in September 2002 for multifocal breast carcinoma, triple negative.  Treated with CMF followed by radiation.   (2) Local recurrence in April 2004, status post right modified radical mastectomy with TRAM reconstruction for what proved to be a T1c N0, stage IA triple negative breast carcinoma.  Treated adjuvantly with paclitaxel and doxorubicin x4 in 2004.  Off treatment since August 2004 with no evidence of recurrence  (3) history of papillary thyroid cancer s/p thyroidectomy June 2007, s/p radioactive iodine July 2007  (4) status post left breast upper outer quadrant biopsy 08/21/2016 for a clinical T1b N0, stage IB invasive ductal carcinoma, grade 3, weakly estrogen receptor positive, progesterone receptor and HER-2 negative, with an MIB-1 of 80%.  (5) left lumpectomy and sentinel lymph node sampling 09/11/2016 found a pT1b pN0, stage IB invasive ductal carcinoma, grade 3, with negative margins.  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*) (a) there were no deleterious mutations in ALK, APC, ATM, AXIN2, BAP1, BARD1, BLM, BMPR1A, BRCA1, BRCA2, BRIP1, CASR, CDC73, CDH1, CDK4, CDKN1B, CDKN1C, CDKN2A, CEBPA, CHEK2, CTNNA1, DICER1, DIS3L2, EGFR, EPCAM, FH, FLCN, GATA2, GPC3, GREM1, HOXB13, HRAS, KIT, MAX, MEN1, MET, MITF, MLH1, MSH2, MSH3, MSH6, MUTYH, NBN, NF1, NF2, NTHL1, PALB2, PDGFRA, PHOX2B, PMS2, POLD1, POLE, POT1, PRKAR1A, PTCH1, PTEN, RAD50, RAD51C, RAD51D, RB1, RECQL4, RET, RUNX1, SDHA, SDHAF2, SDHB, SDHC, SDHD, SMAD4, SMARCA4, SMARCB1, SMARCE1, STK11, SUFU, TERC, TERT, TMEM127, TP53, TSC1, TSC2, VHL, WRN, WT1 (b) while homozygous mutations in the NPH L1 gene are associated with autosomal recessive polyposis, there is no evidence to suggest that a single mutation in this gene increases cancer risk.  (9) started anastrozole 03/14/2017 (a) bone density on 05/06/2017 showed a T score of -3.4--osteoporosis (b) started denosumab/Prolia June 2019, to be repeated every 6 months   PLAN:  Trudie is   Total encounter time 25 minutes.*  Lindsey Causey, NP 08/02/20 11:37 AM Medical Oncology and Hematology [facility] [address] [place], [place] [address] Tel. [phone]    Fax. [phone]   *Total Encounter Time   as defined by the Centers for Medicare and Medicaid Services includes, in addition to the face-to-face time of a patient visit (documented in the note above) non-face-to-face time: obtaining and reviewing outside history, ordering and reviewing  medications, tests or procedures, care coordination (communications with other health care professionals or caregivers) and documentation in the medical record.

## 2020-08-03 ENCOUNTER — Other Ambulatory Visit: Payer: Medicare Other

## 2020-08-03 ENCOUNTER — Ambulatory Visit: Payer: Medicare Other

## 2020-08-03 ENCOUNTER — Telehealth: Payer: Self-pay

## 2020-08-03 ENCOUNTER — Ambulatory Visit: Payer: Medicare Other | Admitting: Adult Health

## 2020-08-03 NOTE — Telephone Encounter (Signed)
This nurse spoke with patient related to no show for her appts today.  Patient stated that she called and left a message on yesterday that she would not be able to make her appts.  Patient has nasal congestion, earache in both ears and is hoarse.  She was advised by PCP to have Covid test, which she completed on Tuesday and the results are still pending.  This nurse advised that scheduling will reach out to her in few days to reschedule her appts.  No further questions or concerns at this time.

## 2020-08-04 ENCOUNTER — Telehealth: Payer: Self-pay | Admitting: Adult Health

## 2020-08-04 NOTE — Telephone Encounter (Signed)
Called pt to r/s appts per 6/23 sch msg. NO answer. Left msg for pt to call back once she got her covid results.

## 2020-08-07 ENCOUNTER — Telehealth: Payer: Self-pay | Admitting: Oncology

## 2020-08-07 NOTE — Telephone Encounter (Signed)
R/s appts per 6/27 sch msg. Pt aware.

## 2020-08-08 ENCOUNTER — Telehealth: Payer: Self-pay | Admitting: Internal Medicine

## 2020-08-08 ENCOUNTER — Ambulatory Visit
Admission: RE | Admit: 2020-08-08 | Discharge: 2020-08-08 | Disposition: A | Discharge status: Home / Self Care | Payer: Medicare Other | Source: Ambulatory Visit | Attending: Internal Medicine | Admitting: Internal Medicine

## 2020-08-08 ENCOUNTER — Other Ambulatory Visit: Payer: Self-pay

## 2020-08-08 DIAGNOSIS — Z1231 Encounter for screening mammogram for malignant neoplasm of breast: Secondary | ICD-10-CM

## 2020-08-08 DIAGNOSIS — H2513 Age-related nuclear cataract, bilateral: Secondary | ICD-10-CM | POA: Diagnosis not present

## 2020-08-08 DIAGNOSIS — H34231 Retinal artery branch occlusion, right eye: Secondary | ICD-10-CM | POA: Diagnosis not present

## 2020-08-08 DIAGNOSIS — H34831 Tributary (branch) retinal vein occlusion, right eye, with macular edema: Secondary | ICD-10-CM | POA: Diagnosis not present

## 2020-08-08 NOTE — Telephone Encounter (Signed)
Patient has a detached retina, scheduled to see Dr. Alain Marion on 7.5.22.  Patient needs the following items ordered:  cartoid ultrasound, echocardiogram, CBC ESR, CRP and stroke work-up Paperwork from [facility] requisition placed in providers box  Patient would like a call from Dr. Alain Marion today after he sees his patients if possible.

## 2020-08-08 NOTE — Telephone Encounter (Signed)
Patient states that she is very concerned about the detached retina. Per patient the on call eye doctor told her she may have had a stroke and she needed to have that checked out ASAP. Requesting a call back.   Please advise.

## 2020-08-09 ENCOUNTER — Encounter (HOSPITAL_COMMUNITY): Payer: Self-pay

## 2020-08-09 ENCOUNTER — Emergency Department (HOSPITAL_COMMUNITY): Payer: Medicare Other

## 2020-08-09 ENCOUNTER — Other Ambulatory Visit: Payer: Self-pay | Admitting: Internal Medicine

## 2020-08-09 ENCOUNTER — Observation Stay (HOSPITAL_COMMUNITY)
Admission: EM | Admit: 2020-08-09 | Discharge: 2020-08-10 | Disposition: A | Discharge status: Home / Self Care | Payer: Medicare Other | Attending: Emergency Medicine | Admitting: Emergency Medicine

## 2020-08-09 ENCOUNTER — Other Ambulatory Visit: Payer: Self-pay

## 2020-08-09 ENCOUNTER — Observation Stay (HOSPITAL_COMMUNITY): Payer: Medicare Other

## 2020-08-09 DIAGNOSIS — H34231 Retinal artery branch occlusion, right eye: Principal | ICD-10-CM | POA: Diagnosis present

## 2020-08-09 DIAGNOSIS — E039 Hypothyroidism, unspecified: Secondary | ICD-10-CM | POA: Insufficient documentation

## 2020-08-09 DIAGNOSIS — Z79899 Other long term (current) drug therapy: Secondary | ICD-10-CM | POA: Insufficient documentation

## 2020-08-09 DIAGNOSIS — R7303 Prediabetes: Secondary | ICD-10-CM | POA: Diagnosis not present

## 2020-08-09 DIAGNOSIS — I2510 Atherosclerotic heart disease of native coronary artery without angina pectoris: Secondary | ICD-10-CM | POA: Diagnosis not present

## 2020-08-09 DIAGNOSIS — H348111 Central retinal vein occlusion, right eye, with retinal neovascularization: Secondary | ICD-10-CM | POA: Diagnosis not present

## 2020-08-09 DIAGNOSIS — G459 Transient cerebral ischemic attack, unspecified: Secondary | ICD-10-CM | POA: Diagnosis not present

## 2020-08-09 DIAGNOSIS — Z853 Personal history of malignant neoplasm of breast: Secondary | ICD-10-CM | POA: Insufficient documentation

## 2020-08-09 DIAGNOSIS — R2681 Unsteadiness on feet: Secondary | ICD-10-CM | POA: Diagnosis not present

## 2020-08-09 DIAGNOSIS — Z20822 Contact with and (suspected) exposure to covid-19: Secondary | ICD-10-CM | POA: Insufficient documentation

## 2020-08-09 DIAGNOSIS — H348312 Tributary (branch) retinal vein occlusion, right eye, stable: Secondary | ICD-10-CM

## 2020-08-09 DIAGNOSIS — I10 Essential (primary) hypertension: Secondary | ICD-10-CM | POA: Diagnosis not present

## 2020-08-09 DIAGNOSIS — Z85850 Personal history of malignant neoplasm of thyroid: Secondary | ICD-10-CM | POA: Diagnosis not present

## 2020-08-09 DIAGNOSIS — E890 Postprocedural hypothyroidism: Secondary | ICD-10-CM | POA: Diagnosis not present

## 2020-08-09 DIAGNOSIS — E1159 Type 2 diabetes mellitus with other circulatory complications: Secondary | ICD-10-CM | POA: Diagnosis present

## 2020-08-09 DIAGNOSIS — R531 Weakness: Secondary | ICD-10-CM | POA: Diagnosis not present

## 2020-08-09 DIAGNOSIS — R42 Dizziness and giddiness: Secondary | ICD-10-CM | POA: Diagnosis not present

## 2020-08-09 DIAGNOSIS — H349 Unspecified retinal vascular occlusion: Secondary | ICD-10-CM | POA: Diagnosis not present

## 2020-08-09 DIAGNOSIS — G319 Degenerative disease of nervous system, unspecified: Secondary | ICD-10-CM | POA: Diagnosis not present

## 2020-08-09 DIAGNOSIS — Y900 Blood alcohol level of less than 20 mg/100 ml: Secondary | ICD-10-CM | POA: Insufficient documentation

## 2020-08-09 DIAGNOSIS — C50412 Malignant neoplasm of upper-outer quadrant of left female breast: Secondary | ICD-10-CM

## 2020-08-09 DIAGNOSIS — H539 Unspecified visual disturbance: Secondary | ICD-10-CM | POA: Diagnosis present

## 2020-08-09 DIAGNOSIS — E1169 Type 2 diabetes mellitus with other specified complication: Secondary | ICD-10-CM | POA: Diagnosis present

## 2020-08-09 DIAGNOSIS — H34239 Retinal artery branch occlusion, unspecified eye: Secondary | ICD-10-CM | POA: Diagnosis not present

## 2020-08-09 DIAGNOSIS — E785 Hyperlipidemia, unspecified: Secondary | ICD-10-CM | POA: Diagnosis present

## 2020-08-09 DIAGNOSIS — Z95828 Presence of other vascular implants and grafts: Secondary | ICD-10-CM

## 2020-08-09 DIAGNOSIS — Z8673 Personal history of transient ischemic attack (TIA), and cerebral infarction without residual deficits: Secondary | ICD-10-CM | POA: Diagnosis present

## 2020-08-09 DIAGNOSIS — Z170 Estrogen receptor positive status [ER+]: Secondary | ICD-10-CM

## 2020-08-09 DIAGNOSIS — I6782 Cerebral ischemia: Secondary | ICD-10-CM | POA: Diagnosis not present

## 2020-08-09 LAB — I-STAT CHEM 8, ED
BUN: 25 mg/dL — ABNORMAL HIGH (ref 8–23)
Calcium, Ion: 1.14 mmol/L — ABNORMAL LOW (ref 1.15–1.40)
Chloride: 106 mmol/L (ref 98–111)
Creatinine, Ser: 1.1 mg/dL — ABNORMAL HIGH (ref 0.44–1.00)
Glucose, Bld: 90 mg/dL (ref 70–99)
HCT: 38 % (ref 36.0–46.0)
Hemoglobin: 12.9 g/dL (ref 12.0–15.0)
Potassium: 4.7 mmol/L (ref 3.5–5.1)
Sodium: 143 mmol/L (ref 135–145)
TCO2: 27 mmol/L (ref 22–32)

## 2020-08-09 LAB — CBC WITH DIFFERENTIAL/PLATELET
Abs Immature Granulocytes: 0.02 10*3/uL (ref 0.00–0.07)
Basophils Absolute: 0 10*3/uL (ref 0.0–0.1)
Basophils Relative: 1 %
Eosinophils Absolute: 0.1 10*3/uL (ref 0.0–0.5)
Eosinophils Relative: 3 %
HCT: 39 % (ref 36.0–46.0)
Hemoglobin: 12.3 g/dL (ref 12.0–15.0)
Immature Granulocytes: 1 %
Lymphocytes Relative: 43 %
Lymphs Abs: 1.9 10*3/uL (ref 0.7–4.0)
MCH: 28.7 pg (ref 26.0–34.0)
MCHC: 31.5 g/dL (ref 30.0–36.0)
MCV: 90.9 fL (ref 80.0–100.0)
Monocytes Absolute: 0.4 10*3/uL (ref 0.1–1.0)
Monocytes Relative: 9 %
Neutro Abs: 1.9 10*3/uL (ref 1.7–7.7)
Neutrophils Relative %: 43 %
Platelets: 306 10*3/uL (ref 150–400)
RBC: 4.29 MIL/uL (ref 3.87–5.11)
RDW: 13.7 % (ref 11.5–15.5)
WBC: 4.4 10*3/uL (ref 4.0–10.5)
nRBC: 0 % (ref 0.0–0.2)

## 2020-08-09 LAB — RAPID URINE DRUG SCREEN, HOSP PERFORMED
Amphetamines: NOT DETECTED
Barbiturates: NOT DETECTED
Benzodiazepines: POSITIVE — AB
Cocaine: NOT DETECTED
Opiates: NOT DETECTED
Tetrahydrocannabinol: NOT DETECTED

## 2020-08-09 LAB — URINALYSIS, ROUTINE W REFLEX MICROSCOPIC
Bilirubin Urine: NEGATIVE
Glucose, UA: 50 mg/dL — AB
Hgb urine dipstick: NEGATIVE
Ketones, ur: NEGATIVE mg/dL
Nitrite: NEGATIVE
Protein, ur: NEGATIVE mg/dL
Specific Gravity, Urine: 1.015 (ref 1.005–1.030)
pH: 6 (ref 5.0–8.0)

## 2020-08-09 LAB — COMPREHENSIVE METABOLIC PANEL
ALT: 13 U/L (ref 0–44)
AST: 23 U/L (ref 15–41)
Albumin: 3.9 g/dL (ref 3.5–5.0)
Alkaline Phosphatase: 64 U/L (ref 38–126)
Anion gap: 7 (ref 5–15)
BUN: 17 mg/dL (ref 8–23)
CO2: 24 mmol/L (ref 22–32)
Calcium: 9.9 mg/dL (ref 8.9–10.3)
Chloride: 104 mmol/L (ref 98–111)
Creatinine, Ser: 1.25 mg/dL — ABNORMAL HIGH (ref 0.44–1.00)
GFR, Estimated: 43 mL/min — ABNORMAL LOW (ref >60)
Glucose, Bld: 183 mg/dL — ABNORMAL HIGH (ref 70–99)
Potassium: 3.9 mmol/L (ref 3.5–5.1)
Sodium: 135 mmol/L (ref 135–145)
Total Bilirubin: 0.6 mg/dL (ref 0.3–1.2)
Total Protein: 7.4 g/dL (ref 6.5–8.1)

## 2020-08-09 LAB — PROTIME-INR
INR: 1 (ref 0.8–1.2)
Prothrombin Time: 13.5 seconds (ref 11.4–15.2)

## 2020-08-09 LAB — C-REACTIVE PROTEIN: CRP: 0.5 mg/dL (ref <1.0)

## 2020-08-09 LAB — RESP PANEL BY RT-PCR (FLU A&B, COVID) ARPGX2
Influenza A by PCR: NEGATIVE
Influenza B by PCR: NEGATIVE
SARS Coronavirus 2 by RT PCR: NEGATIVE

## 2020-08-09 LAB — APTT: aPTT: 28 seconds (ref 24–36)

## 2020-08-09 LAB — SEDIMENTATION RATE: Sed Rate: 20 mm/hr (ref 0–22)

## 2020-08-09 LAB — ETHANOL: Alcohol, Ethyl (B): <10 mg/dL (ref <10)

## 2020-08-09 MED ORDER — ALPRAZOLAM 0.25 MG PO TABS
0.5000 mg | ORAL_TABLET | Freq: Two times a day (BID) | ORAL | Status: DC | PRN
  Administered 2020-08-10 (×2): 0.5 mg via ORAL
  Filled 2020-08-09 (×2): qty 2

## 2020-08-09 MED ORDER — VITAMIN B-12 1000 MCG PO TABS
1000.0000 ug | ORAL_TABLET | Freq: Every day | ORAL | Status: DC
  Administered 2020-08-10: 1000 ug via ORAL
  Filled 2020-08-09: qty 1

## 2020-08-09 MED ORDER — ACETAMINOPHEN 325 MG PO TABS
650.0000 mg | ORAL_TABLET | ORAL | Status: DC | PRN
  Administered 2020-08-10: 650 mg via ORAL
  Filled 2020-08-09: qty 2

## 2020-08-09 MED ORDER — ANASTROZOLE 1 MG PO TABS
1.0000 mg | ORAL_TABLET | Freq: Every day | ORAL | Status: DC
  Administered 2020-08-09 – 2020-08-10 (×2): 1 mg via ORAL
  Filled 2020-08-09 (×2): qty 1

## 2020-08-09 MED ORDER — ROSUVASTATIN CALCIUM 20 MG PO TABS
40.0000 mg | ORAL_TABLET | Freq: Every day | ORAL | Status: DC
  Administered 2020-08-09 – 2020-08-10 (×2): 40 mg via ORAL
  Filled 2020-08-09 (×2): qty 2

## 2020-08-09 MED ORDER — IOHEXOL 350 MG/ML SOLN
50.0000 mL | Freq: Once | INTRAVENOUS | Status: AC | PRN
  Administered 2020-08-09: 50 mL via INTRAVENOUS

## 2020-08-09 MED ORDER — ACETAMINOPHEN 160 MG/5ML PO SOLN: 650.0000 mg | ORAL | Status: DC | PRN

## 2020-08-09 MED ORDER — ENOXAPARIN SODIUM 40 MG/0.4ML IJ SOSY
40.0000 mg | PREFILLED_SYRINGE | INTRAMUSCULAR | Status: DC
  Administered 2020-08-09: 40 mg via SUBCUTANEOUS
  Filled 2020-08-09: qty 0.4

## 2020-08-09 MED ORDER — LEVOTHYROXINE SODIUM 112 MCG PO TABS
112.0000 ug | ORAL_TABLET | Freq: Every day | ORAL | Status: DC
  Administered 2020-08-10: 112 ug via ORAL
  Filled 2020-08-09: qty 1

## 2020-08-09 MED ORDER — ACETAMINOPHEN 650 MG RE SUPP: 650.0000 mg | RECTAL | Status: DC | PRN

## 2020-08-09 MED ORDER — STROKE: EARLY STAGES OF RECOVERY BOOK
Freq: Once | Status: AC
  Filled 2020-08-09: qty 1

## 2020-08-09 NOTE — ED Triage Notes (Signed)
Pt sent here by pathoanatomist for a stroke work up. Pt has a retinal tear and experiencing dizziness x2 days.

## 2020-08-09 NOTE — ED Notes (Signed)
Attempted to call report x2

## 2020-08-09 NOTE — ED Provider Notes (Signed)
Emergency Medicine Provider Triage Evaluation Note  [name] [name] , a 80 y.o. female  was evaluated in triage.  Pt complains of dizziness for several months.  She reports that she was diagnosed with retinal detachment this week and plans to have outpatient intervention through one of her preferred ophthalmologists.  She states that one of her specialty providers encouraged her to have stroke work-up including carotid artery ultrasound.  She called her primary care office, Dr. Alain [name], and still has not been able to have her dizziness/stroke concerns addressed.  She came to the ED because she feels nobody wants to work up her several month history of dizziness in outpatient setting.  Review of Systems  Positive: Dizziness x 6 months Negative: New complaints  Physical Exam  BP (!) 114/99 (BP Location: Left Arm)   Pulse (!) 102   Temp 98.4 F (36.9 C) (Oral)   Resp 17   SpO2 99%  Gen:   Awake, no distress   Resp:  Normal effort  MSK:   Moves extremities without difficulty  Other:    Medical Decision Making  Medically screening exam initiated at 10:35 AM.  Appropriate orders placed.  [name] [name] was informed that the remainder of the evaluation will be completed by another provider, this initial triage assessment does not replace that evaluation, and the importance of remaining in the ED until their evaluation is complete.  Chronic complaint. Feels as though it's not being worked up outpatient. Difficult historian.     [name] Herter, PA-C 08/09/20 1035    [name] Gambler, MD 08/12/20 [phone]

## 2020-08-09 NOTE — ED Notes (Signed)
Attempted to call report x 1  

## 2020-08-09 NOTE — ED Provider Notes (Signed)
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  Dr. Malen Gauze with neurology recommends admission for stroke work-up.  She is stable at this time.  Recommends also add an ESR and CRP as temporal arteritis within the differential as well.  To be admitted to medicine.  This chart was dictated using voice recognition software.  Despite best efforts to proofread,  errors can occur which can change the documentation meaning.    Lennice Sites, DO 08/09/20 [phone]

## 2020-08-09 NOTE — H&P (Signed)
[facility] History and Physical   Patient: [name] [name] CWC:[id number]   PCP: [name], MD DOB: 1940/11/08   DOA: 08/09/2020   DOS: 08/09/2020   DOS: the patient was seen and examined on 08/09/2020  Patient coming from: The patient is coming from Home  Chief Complaint: Right-sided vision change  HPI: [name] [name] is a 80 y.o. female with Past medical history of breast cancer, HTN, HLD, depression, B12 deficiency.. Patient presents with complaints of vision changes ongoing since last Monday. When she woke up on Monday she noticed that her lower part of the right vision had a curtain like block.  She called her ophthalmologist and went to see them.  She was found to have branch retinal artery occlusion as well as right retinal vein occlusion.  Recommended that she follows up outpatient and get stroke work-up.  She was also recommended to be treated with atorvastatin. She was not able to see her PCP for Days or decided to come to the ER. At the time of my evaluation she reports some temporal headache.  No nausea no vomiting.  No jaw claudication.  No chest pain.  No abdominal pain.  No dizziness or lightheadedness.  No tingling no numbness.  No diarrhea no constipation.  No history of bleeding.  No active smoking. Reports a lot of stress in life right now.  ED Course: Presents with above complaint.  CT head angiogram was performed.  Neurology was consulted.  Patient was referred for admission for stroke work-up.   Review of Systems: as mentioned in the history of present illness.  All other systems reviewed and are negative.  Past Medical History:  Diagnosis Date   Allergic rhinitis    Anxiety    Breast cancer ([place]) hx 2004   recurrent 2006 DR Magrinat   Family history of breast cancer    Genetic testing 12/05/2016   Multi-Cancer panel (83 genes) @ [facility] - Monoallelic mutation in [place] (carrier)   HTN (hypertension)    Hyperlipidemia    Hypothyroidism     Personal history of chemotherapy 2002   Personal history of radiation therapy 2002   Psoriasis    S/P thyroidectomy 07/2005   2 cm largest diameter (1 other tiny focus)/ i-131 rx 99 [facility] 08/2005   Thyroid cancer ([place])    Papillary Stage 1 - Dr Loanne Drilling   Vitamin B12 deficiency 2009   Vitamin D deficiency 2009   Past Surgical History:  Procedure Laterality Date   BREAST LUMPECTOMY     BREAST LUMPECTOMY WITH RADIOACTIVE SEED AND SENTINEL LYMPH NODE BIOPSY Left 09/11/2016   Procedure: LEFT BREAST LUMPECTOMY WITH RADIOACTIVE SEED AND LEFT AXILLARY SENTINEL LYMPH NODE BIOPSY WITH BLUE DYE INJECTION;  Surgeon: [name], MD;  Location: [facility];  Service: General;  Laterality: Left;   IR FLUORO GUIDE PORT INSERTION RIGHT  09/13/2016   IR REMOVAL TUN ACCESS W/ PORT W/O FL MOD SED  12/30/2016   IR US GUIDE VASC ACCESS RIGHT  09/13/2016   MASTECTOMY Right    Right   PORTACATH PLACEMENT N/A 09/11/2016   Procedure: ATTEMPTED INSERTION PORT-A-CATH WITH ULTRA SOUND GUIDANCE;  Surgeon: [name], MD;  Location: [place];  Service: General;  Laterality: N/A;   REDUCTION MAMMAPLASTY Left 2005   THYROIDECTOMY  2007   Social History:  reports that she has never smoked. She has never used smokeless tobacco. She reports that she does not drink alcohol and does not use drugs.  Allergies  Allergen Reactions   Pneumococcal Vaccines Other (See Comments)    Was really sick    Sulfa Antibiotics Other (See Comments)    Pt believes it was hives   Sulfacetamide Sodium-Sulfur    Tape Itching, Dermatitis and Rash   Tegaderm Ag Mesh [Silver] Itching and Rash   Family history reviewed and not pertinent Family History  Problem Relation Age of Onset   Stroke Mother    Allergies Mother    Asthma Mother    Clotting disorder Mother    Heart disease Father 108       MI   Allergies Sister    Asthma Sister    Breast cancer Sister 51       Lymphoma 38; currently 67    Lymphoma Sister    Hyperparathyroidism Sister    Asthma Brother    Leukemia Brother        dx 63s   Allergies Daughter    Asthma Daughter    Allergies Sister    Breast cancer Sister 78       currently 12   Kidney cancer Paternal Aunt        kidney ca; deceased 33   Liver cancer Paternal Uncle        unk. primary ("liver")   Throat cancer Maternal Grandmother        deceased 19   Lung cancer Maternal Grandfather        deceased 35   Pancreatic cancer Paternal Grandfather        deceased 84   Cancer Paternal Aunt        "abdominal"; deceased 3     Prior to Admission medications   Medication Sig Start Date End Date Taking? Authorizing Provider  ALPRAZolam (XANAX) 1 MG tablet TAKE 0.5-1 TABLETS (0.5-1 MG TOTAL) BY MOUTH 2 (TWO) TIMES DAILY AS NEEDED FOR ANXIETY. 03/15/20   Plotnikov, Evie Lacks, MD  anastrozole (ARIMIDEX) 1 MG tablet TAKE 1 TABLET BY MOUTH EVERY DAY 08/31/19   Magrinat, [name], MD  cholecalciferol (VITAMIN D) 1000 units tablet Take 2 tablets (2,000 Units total) by mouth daily. 06/19/17   Plotnikov, Evie Lacks, MD  levothyroxine (SYNTHROID) 112 MCG tablet TAKE 1 TABLET (112 MCG TOTAL) BY MOUTH DAILY. 12/27/19   Plotnikov, Evie Lacks, MD  meclizine (ANTIVERT) 12.5 MG tablet 1-2 tab by mouth every 8 hrs as needed 06/25/18   Biagio Borg, MD  rosuvastatin (CRESTOR) 20 MG tablet AS DIRECTED 05/15/20   Plotnikov, Evie Lacks, MD  telmisartan (MICARDIS) 40 MG tablet TAKE 1 TABLET BY MOUTH EVERY DAY 12/24/19   Plotnikov, Evie Lacks, MD  vitamin B-12 (CYANOCOBALAMIN) 1000 MCG tablet Take 1,000 mcg by mouth every other day.    [provider]    Physical Exam: Vitals:   08/09/20 1800 08/09/20 1815 08/09/20 1830 08/09/20 1900  BP: (!) 170/108 (!) 153/99 (!) 154/110 (!) 150/91  Pulse: 93 88 91 91  Resp: 18 (!) 25 (!) 26 (!) 23  Temp:      TempSrc:      SpO2: 98% 97% 95% 97%    General: alert and oriented to time, place, and person. Appear in mild distress, affect  appropriate Eyes: PERRL, Conjunctiva normal ENT: Oral Mucosa Clear, moist  Neck: no JVD, no Abnormal Mass Or lumps Cardiovascular: S1 and S2 Present, no Murmur, peripheral pulses symmetrical Respiratory: good respiratory effort, Bilateral Air entry equal and Decreased, no signs of accessory muscle use, Clear to Auscultation, no Crackles, no  wheezes Abdomen: Bowel Sound present, Soft and no tenderness, no hernia Skin: no rashes  Extremities: no Pedal edema, no calf tenderness Neurologic: without any new focal findings, mental status, alert and oriented x3, speech normal, PERLA, Motor strength 5/5 and symmetric, and Sensation grossly normal to light touch Gait not checked due to patient safety concerns  Data Reviewed: I have personally reviewed and interpreted labs, imaging as discussed below.  CBC: Recent Labs  Lab 08/09/20 1035 08/09/20 1451  WBC 4.4  --   NEUTROABS 1.9  --   HGB 12.3 12.9  HCT 39.0 38.0  MCV 90.9  --   PLT 306  --    Basic Metabolic Panel: Recent Labs  Lab 08/09/20 1035 08/09/20 1451  NA 135 143  K 3.9 4.7  CL 104 106  CO2 24  --   GLUCOSE 183* 90  BUN 17 25*  CREATININE 1.25* 1.10*  CALCIUM 9.9  --    GFR: CrCl cannot be calculated (Unknown ideal weight.). Liver Function Tests: Recent Labs  Lab 08/09/20 1035  AST 23  ALT 13  ALKPHOS 64  BILITOT 0.6  PROT 7.4  ALBUMIN 3.9   No results for input(s): LIPASE, AMYLASE in the last 168 hours. No results for input(s): AMMONIA in the last 168 hours. Coagulation Profile: Recent Labs  Lab 08/09/20 1705  INR 1.0   Cardiac Enzymes: No results for input(s): CKTOTAL, CKMB, CKMBINDEX, TROPONINI in the last 168 hours. BNP (last 3 results) No results for input(s): PROBNP in the last 8760 hours. HbA1C: No results for input(s): HGBA1C in the last 72 hours. CBG: No results for input(s): GLUCAP in the last 168 hours. Lipid Profile: No results for input(s): CHOL, HDL, LDLCALC, TRIG, CHOLHDL,  LDLDIRECT in the last 72 hours. Thyroid Function Tests: No results for input(s): TSH, T4TOTAL, FREET4, T3FREE, THYROIDAB in the last 72 hours. Anemia Panel: No results for input(s): VITAMINB12, FOLATE, FERRITIN, TIBC, IRON, RETICCTPCT in the last 72 hours. Urine analysis:    Component Value Date/Time   COLORURINE YELLOW 08/09/2020 1543   APPEARANCEUR CLEAR 08/09/2020 1543   LABSPEC 1.015 08/09/2020 1543   PHURINE 6.0 08/09/2020 1543   GLUCOSEU 50 (A) 08/09/2020 1543   GLUCOSEU NEGATIVE 06/05/2015 1559   HGBUR NEGATIVE 08/09/2020 1543   BILIRUBINUR NEGATIVE 08/09/2020 [place] 08/09/2020 1543   PROTEINUR NEGATIVE 08/09/2020 1543   UROBILINOGEN 0.2 06/05/2015 1559   NITRITE NEGATIVE 08/09/2020 1543   LEUKOCYTESUR SMALL (A) 08/09/2020 1543    Radiological Exams on Admission: CT Angio Head W or Wo Contrast  Result Date: 08/09/2020 CLINICAL DATA:  Branch retinal artery occlusion. EXAM: CT ANGIOGRAPHY HEAD AND NECK TECHNIQUE: Multidetector CT imaging of the head and neck was performed using the standard protocol during bolus administration of intravenous contrast. Multiplanar CT image reconstructions and MIPs were obtained to evaluate the vascular anatomy. Carotid stenosis measurements (when applicable) are obtained utilizing NASCET criteria, using the distal internal carotid diameter as the denominator. CONTRAST:  48mL OMNIPAQUE IOHEXOL 350 MG/ML SOLN COMPARISON:  Head CT 03/17/2020 FINDINGS: CT HEAD FINDINGS Brain: There is no evidence of an acute infarct, intracranial hemorrhage, mass, midline shift, or extra-axial fluid collection. Confluent hypodensities in the cerebral white matter are similar to the prior CT and are nonspecific but compatible with severe chronic small vessel ischemic disease. There is mild cerebral atrophy. Vascular: Reported below. Skull: No fracture or suspicious osseous lesion. Sinuses: Visualized paranasal sinuses and mastoid air cells are clear.  Orbits: Unremarkable. Review of  the MIP images confirms the above findings CTA NECK FINDINGS Aortic arch: Standard 3 vessel aortic arch with mild atherosclerotic plaque. No significant arch vessel origin stenosis. Right carotid system: Patent with a small amount of calcified plaque at the carotid bifurcation. No evidence of dissection or significant stenosis. Mild beading of the mid cervical ICA. Left carotid system: Patent without evidence of a dissection or significant stenosis. Retropharyngeal course of the distal common and proximal internal carotid arteries. Mild beading of the mid cervical ICA. 2 mm laterally directed outpouching from the ICA at the distal aspect of the beaded region consistent with a pseudoaneurysm (series 12, image 114). Vertebral arteries: Patent and codominant without evidence of a significant stenosis or dissection. Mild beading of the left V3 segment. Skeleton: Moderate cervical disc degeneration. Other neck: Status post thyroidectomy. Asymmetric fatty atrophy of the left parotid gland and right submandibular gland. Upper chest: No apical lung consolidation or mass. Review of the MIP images confirms the above findings CTA HEAD FINDINGS Anterior circulation: The internal carotid arteries are widely patent from skull base to carotid termini. There is developmental asymmetry with the right ICA being larger than the left due to absence of the left A1 segment, and there is also superimposed ectasia of the right cavernous ICA. ACAs and MCAs are patent without evidence of a proximal branch occlusion or significant proximal stenosis. No aneurysm is identified. Posterior circulation: The intracranial vertebral arteries are widely patent to the basilar. Patent left PICA, right AICA, and bilateral SCA origins are identified. The basilar artery is widely patent. Posterior communicating arteries are diminutive or absent. The PCAs are patent with mild asymmetric attenuation of distal right PCA branch  vessels but no significant proximal stenosis. No aneurysm is identified. Venous sinuses: Patent. Anatomic variants: Left A1 aplasia. Review of the MIP images confirms the above findings IMPRESSION: 1. No evidence of acute intracranial abnormality. Severe chronic small vessel ischemia in the cerebral white matter. 2. No large vessel occlusion or significant proximal stenosis in the head or neck. 3. Beading of the cervical internal carotid arteries and left vertebral artery V3 segment consistent with fibromuscular dysplasia. 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 4. No major intracranial arterial occlusion or significant proximal stenosis. 5. Aortic Atherosclerosis (ICD10-I70.0). Electronically Signed   By: Logan Bores M.D.   On: 08/09/2020 18:05   DG Chest 2 View  Result Date: 08/09/2020 CLINICAL DATA:  Weak and dizzy EXAM: CHEST - 2 VIEW COMPARISON:  07/09/2017 FINDINGS: Heart size and vascularity normal. Lungs clear without infiltrate effusion or mass. Mild apical scarring bilaterally. Right mastectomy.  Surgical clips left breast. Moderate thoracic scoliosis. IMPRESSION: No active cardiopulmonary disease. Electronically Signed   By: Franchot Gallo M.D.   On: 08/09/2020 13:33   CT Angio Neck W and/or Wo Contrast  Result Date: 08/09/2020 CLINICAL DATA:  Branch retinal artery occlusion. EXAM: CT ANGIOGRAPHY HEAD AND NECK TECHNIQUE: Multidetector CT imaging of the head and neck was performed using the standard protocol during bolus administration of intravenous contrast. Multiplanar CT image reconstructions and MIPs were obtained to evaluate the vascular anatomy. Carotid stenosis measurements (when applicable) are obtained utilizing NASCET criteria, using the distal internal carotid diameter as the denominator. CONTRAST:  62mL OMNIPAQUE IOHEXOL 350 MG/ML SOLN COMPARISON:  Head CT 03/17/2020 FINDINGS: CT HEAD FINDINGS Brain: There is no evidence of an acute infarct, intracranial hemorrhage,  mass, midline shift, or extra-axial fluid collection. Confluent hypodensities in the cerebral white matter are similar to the prior  CT and are nonspecific but compatible with severe chronic small vessel ischemic disease. There is mild cerebral atrophy. Vascular: Reported below. Skull: No fracture or suspicious osseous lesion. Sinuses: Visualized paranasal sinuses and mastoid air cells are clear. Orbits: Unremarkable. Review of the MIP images confirms the above findings CTA NECK FINDINGS Aortic arch: Standard 3 vessel aortic arch with mild atherosclerotic plaque. No significant arch vessel origin stenosis. Right carotid system: Patent with a small amount of calcified plaque at the carotid bifurcation. No evidence of dissection or significant stenosis. Mild beading of the mid cervical ICA. Left carotid system: Patent without evidence of a dissection or significant stenosis. Retropharyngeal course of the distal common and proximal internal carotid arteries. Mild beading of the mid cervical ICA. 2 mm laterally directed outpouching from the ICA at the distal aspect of the beaded region consistent with a pseudoaneurysm (series 12, image 114). Vertebral arteries: Patent and codominant without evidence of a significant stenosis or dissection. Mild beading of the left V3 segment. Skeleton: Moderate cervical disc degeneration. Other neck: Status post thyroidectomy. Asymmetric fatty atrophy of the left parotid gland and right submandibular gland. Upper chest: No apical lung consolidation or mass. Review of the MIP images confirms the above findings CTA HEAD FINDINGS Anterior circulation: The internal carotid arteries are widely patent from skull base to carotid termini. There is developmental asymmetry with the right ICA being larger than the left due to absence of the left A1 segment, and there is also superimposed ectasia of the right cavernous ICA. ACAs and MCAs are patent without evidence of a proximal branch occlusion or  significant proximal stenosis. No aneurysm is identified. Posterior circulation: The intracranial vertebral arteries are widely patent to the basilar. Patent left PICA, right AICA, and bilateral SCA origins are identified. The basilar artery is widely patent. Posterior communicating arteries are diminutive or absent. The PCAs are patent with mild asymmetric attenuation of distal right PCA branch vessels but no significant proximal stenosis. No aneurysm is identified. Venous sinuses: Patent. Anatomic variants: Left A1 aplasia. Review of the MIP images confirms the above findings IMPRESSION: 1. No evidence of acute intracranial abnormality. Severe chronic small vessel ischemia in the cerebral white matter. 2. No large vessel occlusion or significant proximal stenosis in the head or neck. 3. Beading of the cervical internal carotid arteries and left vertebral artery V3 segment consistent with fibromuscular dysplasia. 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 4. No major intracranial arterial occlusion or significant proximal stenosis. 5. Aortic Atherosclerosis (ICD10-I70.0). Electronically Signed   By: Logan Bores M.D.   On: 08/09/2020 18:05   MR BRAIN WO CONTRAST  Result Date: 08/09/2020 CLINICAL DATA:  Vision loss.  Right branch retinal artery occlusion. EXAM: MRI HEAD WITHOUT CONTRAST TECHNIQUE: Multiplanar, multiecho pulse sequences of the brain and surrounding structures were obtained without intravenous contrast. COMPARISON:  Head and neck CTA 08/09/2020 FINDINGS: Brain: There is no evidence of an acute infarct, intracranial hemorrhage, mass, midline shift, or extra-axial fluid collection. Confluent T2 hyperintensities in the cerebral white matter and pons are nonspecific but compatible with severe chronic small vessel ischemic disease. Heterogeneous T2 hyperintensity is also present in the deep gray nuclei. There is mild cerebral atrophy. Vascular: Major intracranial vascular flow voids are  preserved. Skull and upper cervical spine: Unremarkable bone marrow signal. Sinuses/Orbits: Unremarkable orbits. No significant inflammatory changes in the paranasal sinuses or mastoid air cells. Other: None. IMPRESSION: 1. No acute intracranial abnormality. 2. Severe chronic small vessel ischemic disease. Electronically Signed  By: Logan Bores M.D.   On: 08/09/2020 20:33   MM 3D SCREEN BREAST UNI LEFT  Result Date: 08/09/2020 CLINICAL DATA:  Screening. History of left lumpectomy 2018 and remote right mastectomy. EXAM: DIGITAL SCREENING UNILATERAL LEFT MAMMOGRAM WITH CAD AND TOMOSYNTHESIS TECHNIQUE: Left screening digital craniocaudal and mediolateral oblique mammograms were obtained. Left screening digital breast tomosynthesis was performed. The images were evaluated with computer-aided detection. COMPARISON:  Previous exam(s). ACR Breast Density Category b: There are scattered areas of fibroglandular density. FINDINGS: There are no findings suspicious for malignancy. IMPRESSION: No mammographic evidence of malignancy. A result letter of this screening mammogram will be mailed directly to the patient. RECOMMENDATION: Screening mammogram in one year. (Code:[id number]) BI-RADS CATEGORY  1: Negative. Electronically Signed   By: Everlean Alstrom M.D.   On: 08/09/2020 15:21  EKG: Independently reviewed. normal EKG, normal sinus rhythm, nonspecific ST and T waves changes.  I reviewed all nursing notes, pharmacy notes, vitals, pertinent old records.  Assessment/Plan 1.  BRA O. Outside of the window.  For tPA or intervention. Neurology was consulted. Recommended MRI brain and CT angiogram. Currently patient will be on 81 mg aspirin. Patient is on Crestor 20 mg.  Will increase to 40 mg. Lipid panel ordered. Hemoglobin PT OT speech therapy ordered as well. Patient passed swallowing evaluation.  Allow cardiac diet. Echocardiogram. Will require hypercoagulable work-up  2.  Essential hypertension. Blood  pressure mildly elevated pretension we will allow permissive hypertension and hold medication.  3.  Prediabetic. Hemoglobin A1c 6.2. Patient eats a lot of snickers! Monitor for now.  4.  Hypothyroidism. Continue Synthroid.  5.  Anxiety. Continue Xanax.  6.  Breast cancer. Will continue home regimen.  Nutrition: Cardiac diet DVT Prophylaxis: Subcutaneous Lovenox  Advance goals of care discussion: Full code   Consults: neuro   Family Communication: nofamily was present at bedside, at the time of interview.   Disposition:  From: Home Likely will need Home on discharge.   Author: Berle Mull, MD [facility] 08/09/2020 8:42 PM   To reach On-call, see care teams to locate the attending and reach out to them via [email or web address]. If 7PM-7AM, please contact night-coverage If you still have difficulty reaching the attending provider, please page the [facility] (Director on Call) for [facility] on amion for assistance.

## 2020-08-09 NOTE — ED Notes (Signed)
Pt walked to restroom, stand -by minimal assist

## 2020-08-09 NOTE — Telephone Encounter (Signed)
[facility] FYI:  --Caller stated that she was seen yesterday and dx with retinal detachment with shots in the rt eye. She is having sinus pain with sneezing. She has neck pain but thinks that is r/t to her injections yesterday. She was ins to get a work-up for CVA. She woke up on Mon morning with vision loss 1/2 eye field. She doesn't want to return to that eye specialist for any reason. She is also dizzy that affects her walking for months  Advised to go to ED now or PCP triage

## 2020-08-09 NOTE — Telephone Encounter (Signed)
Patient has called the office again with concerns of her having a stroke, wanting advice. Pt still having some dizziness. Transferred to team health

## 2020-08-09 NOTE — Consult Note (Signed)
Neurology Consultation  Reason for Consult: Branch retinal artery occlusion-right Referring physician-Dr. Ronnald Nian  CC: Vision problems  History is obtained from: Patient, outside ophthalmology note  HPI: [name] [name] is a 80 y.o. female medical history of breast cancer, hypertension, hyperlipidemia, depression, B12 deficiency and vitamin D deficiency presenting for evaluation after ophthalmological exam showed a right branch retinal artery occlusion and branch right retinal vein occlusion with some macular edema. The patient reports her last known well was Monday, 08/07/2020 when she suddenly started to notice that part of her vision-lower part on the right eye appeared as if she was looking through a curtain.  She called an ophthalmologist and went to see them.  Upon their evaluation, they noted a branch right retinal artery and right retinal vein occlusion and recommended that she follow-up outpatient and get stroke work-up within a week and come to the ER if the work-up outpatient could not be done in a week. She could not get into see her primary care physician for the next 10 days so decided to come to the emergency room. Neurological consultation was placed for further management. Patient also reports right-sided temporal headache.  No jaw claudication.  Also reports pain in her shoulders and when she exerts/exercise.   LKW: 2 days ago tpa given?: no, outside the window Premorbid modified Rankin scale (mRS): 0  ROS: Full ROS was performed and is negative except as noted in the HPI.   Past Medical History:  Diagnosis Date   Allergic rhinitis    Anxiety    Breast cancer ([place]) hx 2004   recurrent 2006 DR Magrinat   Family history of breast cancer    Genetic testing 12/05/2016   Multi-Cancer panel (83 genes) @ [facility] - Monoallelic mutation in [place] (carrier)   HTN (hypertension)    Hyperlipidemia    Hypothyroidism    Personal history of chemotherapy 2002   Personal history of  radiation therapy 2002   Psoriasis    S/P thyroidectomy 07/2005   2 cm largest diameter (1 other tiny focus)/ i-131 rx 99 [facility] 08/2005   Thyroid cancer ([place])    Papillary Stage 1 - Dr Loanne Drilling   Vitamin B12 deficiency 2009   Vitamin D deficiency 2009   Family history of breast cancer and multiple sisters.  Family History  Problem Relation Age of Onset   Stroke Mother    Allergies Mother    Asthma Mother    Clotting disorder Mother    Heart disease Father 36       MI   Allergies Sister    Asthma Sister    Breast cancer Sister 82       Lymphoma 16; currently 59   Lymphoma Sister    Hyperparathyroidism Sister    Asthma Brother    Leukemia Brother        dx 69s   Allergies Daughter    Asthma Daughter    Allergies Sister    Breast cancer Sister 35       currently 21   Kidney cancer Paternal Aunt        kidney ca; deceased 46   Liver cancer Paternal Uncle        unk. primary ("liver")   Throat cancer Maternal Grandmother        deceased 27   Lung cancer Maternal Grandfather        deceased 63   Pancreatic cancer Paternal Grandfather        deceased 46   Cancer  Paternal Aunt        "abdominal"; deceased 74     Social History:   reports that she has never smoked. She has never used smokeless tobacco. She reports that she does not drink alcohol and does not use drugs.  Medications No current facility-administered medications for this encounter.  Current Outpatient Medications:    ALPRAZolam (XANAX) 1 MG tablet, TAKE 0.5-1 TABLETS (0.5-1 MG TOTAL) BY MOUTH 2 (TWO) TIMES DAILY AS NEEDED FOR ANXIETY., Disp: 60 tablet, Rfl: 1   anastrozole (ARIMIDEX) 1 MG tablet, TAKE 1 TABLET BY MOUTH EVERY DAY, Disp: 90 tablet, Rfl: 4   cholecalciferol (VITAMIN D) 1000 units tablet, Take 2 tablets (2,000 Units total) by mouth daily., Disp: 100 tablet, Rfl: 3   levothyroxine (SYNTHROID) 112 MCG tablet, TAKE 1 TABLET (112 MCG TOTAL) BY MOUTH DAILY., Disp: 90 tablet, Rfl: 2   meclizine  (ANTIVERT) 12.5 MG tablet, 1-2 tab by mouth every 8 hrs as needed, Disp: 40 tablet, Rfl: 1   rosuvastatin (CRESTOR) 20 MG tablet, AS DIRECTED, Disp: 90 tablet, Rfl: 1   telmisartan (MICARDIS) 40 MG tablet, TAKE 1 TABLET BY MOUTH EVERY DAY, Disp: 90 tablet, Rfl: 0   vitamin B-12 (CYANOCOBALAMIN) 1000 MCG tablet, Take 1,000 mcg by mouth every other day., Disp: , Rfl:    Exam: Current vital signs: BP (!) 149/92   Pulse 94   Temp 98.4 F (36.9 C) (Oral)   Resp 18   SpO2 98%  Vital signs in last 24 hours: Temp:  [98.4 F (36.9 C)] 98.4 F (36.9 C) (06/29 1007) Pulse Rate:  [83-102] 94 (06/29 1631) Resp:  [17-25] 18 (06/29 1631) BP: (114-160)/(79-110) 149/92 (06/29 1631) SpO2:  [93 %-100 %] 98 % (06/29 1631)  GENERAL: Awake, alert in NAD HEENT: - Normocephalic and atraumatic, dry mm, no LN++, no Thyromegally LUNGS - Clear to auscultation bilaterally with no wheezes CV - S1S2 RRR, no m/r/g, equal pulses bilaterally. ABDOMEN - Soft, nontender, nondistended with normoactive BS Ext: warm, well perfused, intact peripheral pulses, no edema  NEURO:  Mental Status: AA&Ox3  Language: speech is nondysarthric.  Naming, repetition, fluency, and comprehension intact. Cranial Nerves: PERRL EOMI,-has altitudinal visual loss in the right eye where she can looking at my face only see my face above the mask line, below the mass line-my mask appears as if she is looking through a curtain, visual fields full, no facial asymmetry, facial sensation intact, hearing intact, tongue/uvula/soft palate midline, normal sternocleidomastoid and trapezius muscle strength. No evidence of tongue atrophy or fibrillations Motor: 5/5 without drift Tone: is normal and bulk is normal Sensation- Intact to light touch bilaterally Coordination: FTN intact bilaterally, no ataxia in BLE. Gait- deferred  NIHSS-1   Labs I have reviewed labs in epic and the results pertinent to this consultation are:   CBC    Component  Value Date/Time   WBC 4.4 08/09/2020 1035   RBC 4.29 08/09/2020 1035   HGB 12.9 08/09/2020 1451   HGB 9.6 (L) 12/11/2016 1017   HCT 38.0 08/09/2020 1451   HCT 28.5 (L) 12/11/2016 1017   PLT 306 08/09/2020 1035   PLT 307 12/11/2016 1017   MCV 90.9 08/09/2020 1035   MCV 99.5 12/11/2016 1017   MCH 28.7 08/09/2020 1035   MCHC 31.5 08/09/2020 1035   RDW 13.7 08/09/2020 1035   RDW 19.8 (H) 12/11/2016 1017   LYMPHSABS 1.9 08/09/2020 1035   LYMPHSABS 1.5 12/11/2016 1017   MONOABS 0.4 08/09/2020 1035   MONOABS  0.5 12/11/2016 1017   EOSABS 0.1 08/09/2020 1035   EOSABS 0.0 12/11/2016 1017   BASOSABS 0.0 08/09/2020 1035   BASOSABS 0.0 12/11/2016 1017    CMP     Component Value Date/Time   NA 143 08/09/2020 1451   NA 139 12/11/2016 1017   K 4.7 08/09/2020 1451   K 3.7 12/11/2016 1017   CL 106 08/09/2020 1451   CL 104 10/09/2011 1403   CO2 24 08/09/2020 1035   CO2 22 12/11/2016 1017   GLUCOSE 90 08/09/2020 1451   GLUCOSE 122 12/11/2016 1017   GLUCOSE 113 (H) 10/09/2011 1403   BUN 25 (H) 08/09/2020 1451   BUN 16.9 12/11/2016 1017   CREATININE 1.10 (H) 08/09/2020 1451   CREATININE 1.74 (H) 10/20/2019 1634   CREATININE 1.2 (H) 12/11/2016 1017   CALCIUM 9.9 08/09/2020 1035   CALCIUM 9.0 12/11/2016 1017   PROT 7.4 08/09/2020 1035   PROT 7.4 12/11/2016 1017   ALBUMIN 3.9 08/09/2020 1035   ALBUMIN 4.0 12/11/2016 1017   AST 23 08/09/2020 1035   AST 65 (H) 12/11/2016 1017   ALT 13 08/09/2020 1035   ALT 51 12/11/2016 1017   ALKPHOS 64 08/09/2020 1035   ALKPHOS 139 12/11/2016 1017   BILITOT 0.6 08/09/2020 1035   BILITOT 0.42 12/11/2016 1017   GFRNONAA 43 (L) 08/09/2020 1035   GFRNONAA 27 (L) 10/20/2019 1634   GFRAA 32 (L) 10/20/2019 1634    Imaging I have reviewed the images obtained: CT-scan of the brain-chronic small vessel disease, dolichoectatic appearing posterior circulation. CTA pending  Assessment: 80 year old with right branch retinal artery occlusion and right  retinal vein occlusion with macular edema.  Outside the window for acute intervention/thrombolysis. Admitted for stroke risk factor work-up. Evaluate for underlying carotid stenosis versus cardiac embolic source.  Recommendations: Admit to medicine CT angiogram head and neck Aspirin 81 Atorvastatin 80 2D echo A1c lipid panel Frequent neurochecks Allow for permissive hypertension for another day or so and then normalize blood pressure. PT, OT, speech therapy Plan discussed with Dr. Ronnald Nian -- Amie Portland, MD Neurologist [facility] Pager: [phone]

## 2020-08-09 NOTE — ED Provider Notes (Addendum)
[facility] EMERGENCY DEPARTMENT Provider Note   CSN: [id number] Arrival date & time: 08/09/20  1000 Patient reports Monday,    History Chief Complaint  Patient presents with   Dizziness    [name] [name] is a 80 y.o. female.  HPI Patient ports Monday, 2 days ago, she awakened and noted that half of her vision in the right eye seemed to be gone or difficult to see through.  She reports it was the lower half of her vision and if she looked at things she did not seem to see the bottom half.  She did not have other associated symptoms.  She does describe some vertiginous symptoms for months.  She has dizziness that comes and goes.  No falls.  No focal weakness numbness or tingling.  She is already calling to try to make an appointment with a ophthalmologist.  She was not able to get in with anybody on Monday and then on Tuesday was seen at [facility].  She reports she was seen by Dr.Pecen.  She reports that she was told she had a retinal detachment and cataract.  She was told to return on 7\27 for treatment.  Patient reports she was also told to give her family doctor a list of things for evaluation for a stroke work-up.  She reports that Dr. Alain Marion cannot see her until next week.  She was advised to come to the emergency department or be seen at their PCP triage office.  14: 25 I have called [facility].  Reviewing the patient's chart with Dr. Latanya Maudlin and assistant, patient's actual diagnosis yesterday was a branch retinal artery occlusion and branch retinal vein occlusion with macular edema and cataracts.  Recommendation at that time was for PCP follow-up with stroke work-up within a week or emergency department if timely outpatient stroke work-up could not be accomplished.  Patient presents to the emergency department for stroke work-up.    Past Medical History:  Diagnosis Date   Allergic rhinitis    Anxiety    Breast cancer ([place]) hx 2004    recurrent 2006 DR Magrinat   Family history of breast cancer    Genetic testing 12/05/2016   Multi-Cancer panel (83 genes) @ [facility] - Monoallelic mutation in [place] (carrier)   HTN (hypertension)    Hyperlipidemia    Hypothyroidism    Personal history of chemotherapy 2002   Personal history of radiation therapy 2002   Psoriasis    S/P thyroidectomy 07/2005   2 cm largest diameter (1 other tiny focus)/ i-131 rx 99 [facility] 08/2005   Thyroid cancer ([place])    Papillary Stage 1 - Dr Loanne Drilling   Vitamin B12 deficiency 2009   Vitamin D deficiency 2009    Patient Active Problem List   Diagnosis Date Noted   Hyperparathyroidism ([place]) 06/13/2020   Skin lesion 03/21/2020   Cerebrovascular disease 03/21/2020   Pruritus 03/14/2020   Ataxia 07/20/2019   Seroma of breast 07/20/2019   Cerumen impaction 03/23/2019   Wrist pain 03/23/2019   Right ear pain 06/27/2018   Coronary artery disease 01/20/2018   Breast cyst, left 09/17/2017   Aortic atherosclerosis ([place]) 07/28/2017   Osteoporosis 05/07/2017   Genetic testing 12/05/2016   Family history of breast cancer    Port catheter in place 10/23/2016   Encounter for antineoplastic chemotherapy 10/23/2016   Malignant neoplasm of upper-outer quadrant of left breast in female, estrogen receptor positive ([place]) 07/29/2016   Well adult exam 06/05/2015  Neoplasm of uncertain behavior of skin 06/05/2015   Adenopathy, cervical 06/05/2015   Tachycardia 06/07/2014   Wart viral 10/18/2011   Hyperglycemia 11/06/2010   Actinic keratoses 05/25/2010   Essential hypertension 02/23/2010   SHOULDER PAIN 11/22/2009   Chronic fatigue 11/08/2009   RLQ PAIN 11/08/2009   VERTIGO 07/11/2009   ECZEMA 10/19/2008   CT, CHEST, ABNORMAL 05/30/2008   STYE 02/17/2008   B12 deficiency 12/16/2007   Vitamin D deficiency 12/16/2007   HYPOTHYROIDISM, POSTSURGICAL 12/01/2007   Acute maxillary sinusitis 11/11/2007   ALLERGIC RESPIRATORY DISEASE, EXTRINSIC 11/11/2007   THYROID  CANCER 08/19/2007   Dyslipidemia 08/19/2007   Anxiety disorder 08/19/2007   Situational depression 08/19/2007   Allergic rhinitis 08/19/2007    Past Surgical History:  Procedure Laterality Date   BREAST LUMPECTOMY     BREAST LUMPECTOMY WITH RADIOACTIVE SEED AND SENTINEL LYMPH NODE BIOPSY Left 09/11/2016   Procedure: LEFT BREAST LUMPECTOMY WITH RADIOACTIVE SEED AND LEFT AXILLARY SENTINEL LYMPH NODE BIOPSY WITH BLUE DYE INJECTION;  Surgeon: [name], MD;  Location: [facility];  Service: General;  Laterality: Left;   IR FLUORO GUIDE PORT INSERTION RIGHT  09/13/2016   IR REMOVAL TUN ACCESS W/ PORT W/O FL MOD SED  12/30/2016   IR US GUIDE VASC ACCESS RIGHT  09/13/2016   MASTECTOMY Right    Right   PORTACATH PLACEMENT N/A 09/11/2016   Procedure: ATTEMPTED INSERTION PORT-A-CATH WITH ULTRA SOUND GUIDANCE;  Surgeon: [name], MD;  Location: [facility];  Service: General;  Laterality: N/A;   REDUCTION MAMMAPLASTY Left 2005   THYROIDECTOMY  2007     OB History   No obstetric history on file.     Family History  Problem Relation Age of Onset   Stroke Mother    Allergies Mother    Asthma Mother    Clotting disorder Mother    Heart disease Father 43       MI   Allergies Sister    Asthma Sister    Breast cancer Sister 107       Lymphoma 71; currently 59   Lymphoma Sister    Hyperparathyroidism Sister    Asthma Brother    Leukemia Brother        dx 23s   Allergies Daughter    Asthma Daughter    Allergies Sister    Breast cancer Sister 76       currently 67   Kidney cancer Paternal Aunt        kidney ca; deceased 49   Liver cancer Paternal Uncle        unk. primary ("liver")   Throat cancer Maternal Grandmother        deceased 51   Lung cancer Maternal Grandfather        deceased 67   Pancreatic cancer Paternal Grandfather        deceased 48   Cancer Paternal Aunt        "abdominal"; deceased 25    Social History   Tobacco  Use   Smoking status: Never   Smokeless tobacco: Never  Substance Use Topics   Alcohol use: No   Drug use: No    Home Medications Prior to Admission medications   Medication Sig Start Date End Date Taking? Authorizing Provider  ALPRAZolam (XANAX) 1 MG tablet TAKE 0.5-1 TABLETS (0.5-1 MG TOTAL) BY MOUTH 2 (TWO) TIMES DAILY AS NEEDED FOR ANXIETY. 03/15/20   Plotnikov, Evie Lacks, MD  anastrozole (ARIMIDEX) 1 MG tablet TAKE  1 TABLET BY MOUTH EVERY DAY 08/31/19   Magrinat, [name], MD  cholecalciferol (VITAMIN D) 1000 units tablet Take 2 tablets (2,000 Units total) by mouth daily. 06/19/17   Plotnikov, Evie Lacks, MD  levothyroxine (SYNTHROID) 112 MCG tablet TAKE 1 TABLET (112 MCG TOTAL) BY MOUTH DAILY. 12/27/19   Plotnikov, Evie Lacks, MD  meclizine (ANTIVERT) 12.5 MG tablet 1-2 tab by mouth every 8 hrs as needed 06/25/18   Biagio Borg, MD  rosuvastatin (CRESTOR) 20 MG tablet AS DIRECTED 05/15/20   Plotnikov, Evie Lacks, MD  telmisartan (MICARDIS) 40 MG tablet TAKE 1 TABLET BY MOUTH EVERY DAY 12/24/19   Plotnikov, Evie Lacks, MD  vitamin B-12 (CYANOCOBALAMIN) 1000 MCG tablet Take 1,000 mcg by mouth every other day.    [provider]    Allergies    Pneumococcal vaccines, Sulfa antibiotics, Sulfacetamide sodium-sulfur, Tape, and Tegaderm ag mesh [silver]  Review of Systems   Review of Systems 10 systems reviewed and negative except as per HPI Physical Exam Updated Vital Signs BP 140/79   Pulse 94   Temp 98.4 F (36.9 C) (Oral)   Resp 20   SpO2 98%   Physical Exam Constitutional:      Appearance: Normal appearance.  HENT:     Head: Normocephalic and atraumatic.     Nose: Nose normal.     Mouth/Throat:     Mouth: Mucous membranes are moist.     Pharynx: Oropharynx is clear.  Eyes:     Extraocular Movements: Extraocular movements intact.     Pupils: Pupils are equal, round, and reactive to light.     Comments: Confrontational visual testing, patient has normal peripheral  vision on the left.  Right eye patient also endorses vision with only reporting that the lower half of her vision on the left seems to be like looking through a curtain or blurry  Cardiovascular:     Rate and Rhythm: Normal rate and regular rhythm.  Pulmonary:     Effort: Pulmonary effort is normal.     Breath sounds: Normal breath sounds.  Abdominal:     General: There is no distension.     Palpations: Abdomen is soft.     Tenderness: There is no abdominal tenderness.  Musculoskeletal:        General: No swelling or tenderness. Normal range of motion.     Cervical back: Neck supple.     Right lower leg: No edema.     Left lower leg: No edema.  Skin:    General: Skin is warm and dry.  Neurological:     General: No focal deficit present.     Mental Status: She is alert and oriented to person, place, and time.     Cranial Nerves: No cranial nerve deficit.     Motor: No weakness.     Coordination: Coordination normal.    ED Results / Procedures / Treatments   Labs (all labs ordered are listed, but only abnormal results are displayed) Labs Reviewed  COMPREHENSIVE METABOLIC PANEL - Abnormal; Notable for the following components:      Result Value   Glucose, Bld 183 (*)    Creatinine, Ser 1.25 (*)    GFR, Estimated 43 (*)    All other components within normal limits  RESP PANEL BY RT-PCR (FLU A&B, COVID) ARPGX2  CBC WITH DIFFERENTIAL/PLATELET  ETHANOL  PROTIME-INR  APTT  RAPID URINE DRUG SCREEN, HOSP PERFORMED  URINALYSIS, ROUTINE W REFLEX MICROSCOPIC  I-STAT CHEM 8,  ED    EKG EKG Interpretation  Date/Time:  Wednesday August 09 2020 10:30:02 EDT Ventricular Rate:  110 PR Interval:  156 QRS Duration: 60 QT Interval:  306 QTC Calculation: 414 R Axis:   78 Text Interpretation: Sinus tachycardia tachycardia, otherwise normal, nosig change from previous Confirmed by Charlesetta Shanks [id number]) on 08/09/2020 11:29:16 AM  Radiology DG Chest 2 View  Result Date:  08/09/2020 CLINICAL DATA:  Weak and dizzy EXAM: CHEST - 2 VIEW COMPARISON:  07/09/2017 FINDINGS: Heart size and vascularity normal. Lungs clear without infiltrate effusion or mass. Mild apical scarring bilaterally. Right mastectomy.  Surgical clips left breast. Moderate thoracic scoliosis. IMPRESSION: No active cardiopulmonary disease. Electronically Signed   By: Franchot Gallo M.D.   On: 08/09/2020 13:33    Procedures Procedures   Medications Ordered in ED Medications - No data to display  ED Course  I have reviewed the triage vital signs and the nursing notes.  Pertinent labs & imaging results that were available during my care of the patient were reviewed by me and considered in my medical decision making (see chart for details).    MDM Rules/Calculators/A&P                         Consult: Reviewed with Dr. Rory Percy.  He will see the patient consult.  Has requested records from [facility].  Order placed to nursing for records and information provided to clerk to obtain faxed records.  After contacting [facility], patient does not have a diagnosis of retinal detachment.  He had visual loss Monday due to retinal artery occlusion.  We will proceed with full stroke work-up and CT angiograms.  We will proceed with consult to neurology.  Exam, patient has no focal motor or cognitive deficits.  Visual fields are intact in the left eye with slight deficit of the lower visual field on the right.  Patient does have intact vision on the right with a lower field cut.  Blood pressures are normotensive.  Patient does not have any active headache or confusion. Final Clinical Impression(s) / ED Diagnoses Final diagnoses:  Retinal artery branch occlusion of right eye    Rx / DC Orders ED Discharge Orders     None        Charlesetta Shanks, MD 08/09/20 [place], MD 08/09/20 1558

## 2020-08-09 NOTE — Telephone Encounter (Signed)
I could see her on Friday next week.  We open my schedule in the morning. Thanks

## 2020-08-10 ENCOUNTER — Observation Stay (HOSPITAL_BASED_OUTPATIENT_CLINIC_OR_DEPARTMENT_OTHER): Payer: Medicare Other

## 2020-08-10 DIAGNOSIS — I10 Essential (primary) hypertension: Secondary | ICD-10-CM

## 2020-08-10 DIAGNOSIS — H348312 Tributary (branch) retinal vein occlusion, right eye, stable: Secondary | ICD-10-CM | POA: Diagnosis not present

## 2020-08-10 DIAGNOSIS — H34231 Retinal artery branch occlusion, right eye: Secondary | ICD-10-CM | POA: Diagnosis not present

## 2020-08-10 LAB — LIPID PANEL
Cholesterol: 134 mg/dL (ref 0–200)
HDL: 37 mg/dL — ABNORMAL LOW (ref >40)
LDL Cholesterol: 44 mg/dL (ref 0–99)
Total CHOL/HDL Ratio: 3.6 RATIO
Triglycerides: 263 mg/dL — ABNORMAL HIGH (ref <150)
VLDL: 53 mg/dL — ABNORMAL HIGH (ref 0–40)

## 2020-08-10 LAB — ECHOCARDIOGRAM COMPLETE
AR max vel: 3.22 cm2
AV Area VTI: 3.27 cm2
AV Area mean vel: 3.19 cm2
AV Mean grad: 4 mmHg
AV Peak grad: 7.8 mmHg
Ao pk vel: 1.4 m/s
Area-P 1/2: 4.06 cm2
Height: 64 in
S' Lateral: 2.3 cm
Weight: 5093.51 oz

## 2020-08-10 LAB — HEMOGLOBIN A1C
Hgb A1c MFr Bld: 6.4 % — ABNORMAL HIGH (ref 4.8–5.6)
Mean Plasma Glucose: 137 mg/dL

## 2020-08-10 MED ORDER — ROSUVASTATIN CALCIUM 20 MG PO TABS: 20.0000 mg | ORAL_TABLET | Freq: Every day | ORAL | Status: DC

## 2020-08-10 MED ORDER — ASPIRIN 81 MG PO TBEC: 81.0000 mg | DELAYED_RELEASE_TABLET | Freq: Every day | ORAL | 1 refills | Status: DC

## 2020-08-10 MED ORDER — CLOPIDOGREL BISULFATE 75 MG PO TABS: 75.0000 mg | ORAL_TABLET | Freq: Every day | ORAL | 0 refills | Status: DC

## 2020-08-10 MED ORDER — ROSUVASTATIN CALCIUM 20 MG PO TABS: 20.0000 mg | ORAL_TABLET | Freq: Every day | ORAL | 1 refills | Status: DC

## 2020-08-10 MED ORDER — CLOPIDOGREL BISULFATE 75 MG PO TABS
75.0000 mg | ORAL_TABLET | Freq: Every day | ORAL | Status: DC
  Administered 2020-08-10: 75 mg via ORAL
  Filled 2020-08-10: qty 1

## 2020-08-10 MED ORDER — ASPIRIN EC 81 MG PO TBEC
81.0000 mg | DELAYED_RELEASE_TABLET | Freq: Every day | ORAL | Status: DC
  Administered 2020-08-10: 81 mg via ORAL
  Filled 2020-08-10: qty 1

## 2020-08-10 MED ORDER — PERFLUTREN LIPID MICROSPHERE
1.0000 mL | INTRAVENOUS | Status: AC | PRN
Start: 2020-08-10 — End: 2020-08-10
  Administered 2020-08-10: 6 mL via INTRAVENOUS
  Filled 2020-08-10: qty 10

## 2020-08-10 NOTE — Discharge Summary (Signed)
Physician Discharge Summary  [name] [name] DOB: [birth year]  PCP: [name], MD  Admitted from: Home Discharged to: Home  Admit date: 08/09/2020 Discharge date: 08/10/2020  Recommendations for Outpatient Follow-up:    [facility] Follow up.   Why: The outpatient therapy will contact you for the first appointment Contact information: [address], [place] [address]  Phone: [phone]        Plotnikov, Evie Lacks, MD. Schedule an appointment as soon as possible for a visit in 1 week(s).   Specialty: Internal Medicine Why: To be seen with repeat labs (CBC & BMP). Contact information: [place] [address] [phone]         Alroy Dust, MD. Schedule an appointment as soon as possible for a visit.   Why: Follow-up regarding your ophthalmology/eyes. Contact information: [place] [address] [phone]                  Home Health: Outpatient PT    Equipment/Devices: None    Discharge Condition: Improved and stable.   Code Status: Full Code Diet recommendation:  Discharge Diet Orders (From admission, onward)     Start     Ordered   08/10/20 0000  Diet - low sodium heart healthy        08/10/20 1638             Discharge Diagnoses:  Active Problems:   Dyslipidemia   Essential hypertension   Malignant neoplasm of upper-outer quadrant of left breast in female, estrogen receptor positive ([place])   Port catheter in place   Coronary artery disease   BRAO (branch retinal artery occlusion)   TIA (transient ischemic attack)   Brief Summary: 80 year old married female, medical history significant for breast cancer, hypertension, hyperlipidemia, depression, anxiety, B12 deficiency and vitamin D deficiency presented to the ED on 08/09/2020 for evaluation after ophthalmological exam showed a right branch retinal  artery occlusion and branch right retinal vein occlusion with some macular edema.  She was reportedly well until 08/07/2020 when she suddenly started to notice that part of her vision-lower part on the right eye appeared as if she was looking through a curtain.  She was seen by an ophthalmologist who noted above findings and recommended that she follow-up outpatient and get stroke work-up within a week and come to the ER if the work-up outpatient could not be done in a week.  She decided to come to the ED.  She also reported right-sided temporal headache without jaw claudication.  She was admitted for evaluation and management of branch retinal artery and vein occlusion.  Neurology was consulted.  Assessment and plan:  Branch retinal artery occlusion: Patient presented as noted above.  Neurology was consulted.  Stroke work-up was completed.  MRI brain negative for acute stroke.  CTA head and neck did not show any significant stenosis.  2D echo showed LVEF of 60-65%.  LDL 44.  A1c is pending and can be followed up outpatient during PCP visit.  As per neurology, recommended dual antiplatelets with aspirin 81 Mg daily and Plavix 75 Mg daily for 3 weeks followed by aspirin 81 Mg daily, continue Crestor at prior home dose of 20 mg daily and outpatient follow-up with ophthalmology and neurology.  Essential hypertension: Continue prior home meds.  Hyperlipidemia: LDL at goal.  Continue home dose of Crestor.  Prediabetes: Prior A1c of 6.1.  Follow repeat A1c results that were sent from the hospital.  Hypothyroid: Continue home dose of Synthroid and outpatient follow-up with PCP/endocrinology.  Anxiety and depression: Patient reports lots of family related stressors.  Close outpatient follow-up with PCP.  Breast cancer: Follows with Dr. Jana Hakim.  Continue Arimidex.   Consultations: Neurology  Procedures: None   Discharge Instructions  Discharge Instructions     Ambulatory referral to Neurology    Complete by: As directed    An appointment is requested in approximately: one month   Ambulatory referral to Physical Therapy   Complete by: As directed    Call MD for:   Complete by: As directed    Recurrent or worsening eye symptoms.   Diet - low sodium heart healthy   Complete by: As directed    Driving Restrictions   Complete by: As directed    Do not drive until follow-up with your family physician during office visit and then follow their recommendation.   Increase activity slowly   Complete by: As directed         Medication List     STOP taking these medications    meclizine 12.5 MG tablet Commonly known as: ANTIVERT       TAKE these medications    ALPRAZolam 1 MG tablet Commonly known as: XANAX TAKE 0.5-1 TABLETS (0.5-1 MG TOTAL) BY MOUTH 2 (TWO) TIMES DAILY AS NEEDED FOR ANXIETY.   anastrozole 1 MG tablet Commonly known as: ARIMIDEX TAKE 1 TABLET BY MOUTH EVERY DAY   aspirin 81 MG EC tablet Take 1 tablet (81 mg total) by mouth daily. Swallow whole. Start taking on: August 11, 2020   cholecalciferol 1000 units tablet Commonly known as: VITAMIN D Take 2 tablets (2,000 Units total) by mouth daily.   clopidogrel 75 MG tablet Commonly known as: PLAVIX Take 1 tablet (75 mg total) by mouth daily for 21 days. Start taking on: August 11, 2020   levothyroxine 112 MCG tablet Commonly known as: SYNTHROID TAKE 1 TABLET (112 MCG TOTAL) BY MOUTH DAILY.   rosuvastatin 20 MG tablet Commonly known as: CRESTOR Take 1 tablet (20 mg total) by mouth daily. What changed:  how much to take how to take this when to take this   telmisartan 40 MG tablet Commonly known as: MICARDIS TAKE 1 TABLET BY MOUTH EVERY DAY   venlafaxine XR 75 MG 24 hr capsule Commonly known as: EFFEXOR-XR TAKE 1 CAPSULE (75 MG TOTAL) BY MOUTH DAILY WITH BREAKFAST.   vitamin B-12 1000 MCG tablet Commonly known as: CYANOCOBALAMIN Take 1,000 mcg by mouth daily.       Allergies  Allergen  Reactions   Pneumococcal Vaccines Other (See Comments)    Was really sick    Sulfa Antibiotics Other (See Comments)    Pt believes it was hives   Sulfacetamide Sodium-Sulfur    Tape Itching, Dermatitis and Rash   Tegaderm Ag Mesh [Silver] Itching and Rash      Procedures/Studies: CT Angio Head W or Wo Contrast  Result Date: 08/09/2020 CLINICAL DATA:  Branch retinal artery occlusion. EXAM: CT ANGIOGRAPHY HEAD AND NECK TECHNIQUE: Multidetector CT imaging of the head and neck was performed using the standard protocol during bolus administration of intravenous contrast. Multiplanar CT image reconstructions and MIPs were obtained to evaluate the vascular anatomy. Carotid stenosis measurements (when applicable) are obtained utilizing NASCET criteria, using the distal internal carotid diameter as the denominator. CONTRAST:  91mL OMNIPAQUE IOHEXOL 350 MG/ML SOLN COMPARISON:  Head CT 03/17/2020  FINDINGS: CT HEAD FINDINGS Brain: There is no evidence of an acute infarct, intracranial hemorrhage, mass, midline shift, or extra-axial fluid collection. Confluent hypodensities in the cerebral white matter are similar to the prior CT and are nonspecific but compatible with severe chronic small vessel ischemic disease. There is mild cerebral atrophy. Vascular: Reported below. Skull: No fracture or suspicious osseous lesion. Sinuses: Visualized paranasal sinuses and mastoid air cells are clear. Orbits: Unremarkable. Review of the MIP images confirms the above findings CTA NECK FINDINGS Aortic arch: Standard 3 vessel aortic arch with mild atherosclerotic plaque. No significant arch vessel origin stenosis. Right carotid system: Patent with a small amount of calcified plaque at the carotid bifurcation. No evidence of dissection or significant stenosis. Mild beading of the mid cervical ICA. Left carotid system: Patent without evidence of a dissection or significant stenosis. Retropharyngeal course of the distal common and  proximal internal carotid arteries. Mild beading of the mid cervical ICA. 2 mm laterally directed outpouching from the ICA at the distal aspect of the beaded region consistent with a pseudoaneurysm (series 12, image 114). Vertebral arteries: Patent and codominant without evidence of a significant stenosis or dissection. Mild beading of the left V3 segment. Skeleton: Moderate cervical disc degeneration. Other neck: Status post thyroidectomy. Asymmetric fatty atrophy of the left parotid gland and right submandibular gland. Upper chest: No apical lung consolidation or mass. Review of the MIP images confirms the above findings CTA HEAD FINDINGS Anterior circulation: The internal carotid arteries are widely patent from skull base to carotid termini. There is developmental asymmetry with the right ICA being larger than the left due to absence of the left A1 segment, and there is also superimposed ectasia of the right cavernous ICA. ACAs and MCAs are patent without evidence of a proximal branch occlusion or significant proximal stenosis. No aneurysm is identified. Posterior circulation: The intracranial vertebral arteries are widely patent to the basilar. Patent left PICA, right AICA, and bilateral SCA origins are identified. The basilar artery is widely patent. Posterior communicating arteries are diminutive or absent. The PCAs are patent with mild asymmetric attenuation of distal right PCA branch vessels but no significant proximal stenosis. No aneurysm is identified. Venous sinuses: Patent. Anatomic variants: Left A1 aplasia. Review of the MIP images confirms the above findings IMPRESSION: 1. No evidence of acute intracranial abnormality. Severe chronic small vessel ischemia in the cerebral white matter. 2. No large vessel occlusion or significant proximal stenosis in the head or neck. 3. Beading of the cervical internal carotid arteries and left vertebral artery V3 segment consistent with fibromuscular dysplasia.  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 4. No major intracranial arterial occlusion or significant proximal stenosis. 5. Aortic Atherosclerosis (ICD10-I70.0). Electronically Signed   By: Logan Bores M.D.   On: 08/09/2020 18:05   DG Chest 2 View  Result Date: 08/09/2020 CLINICAL DATA:  Weak and dizzy EXAM: CHEST - 2 VIEW COMPARISON:  07/09/2017 FINDINGS: Heart size and vascularity normal. Lungs clear without infiltrate effusion or mass. Mild apical scarring bilaterally. Right mastectomy.  Surgical clips left breast. Moderate thoracic scoliosis. IMPRESSION: No active cardiopulmonary disease. Electronically Signed   By: Franchot Gallo M.D.   On: 08/09/2020 13:33   CT Angio Neck W and/or Wo Contrast  Result Date: 08/09/2020 CLINICAL DATA:  Branch retinal artery occlusion. EXAM: CT ANGIOGRAPHY HEAD AND NECK TECHNIQUE: Multidetector CT imaging of the head and neck was performed using the standard protocol during bolus administration of intravenous contrast. Multiplanar CT image  reconstructions and MIPs were obtained to evaluate the vascular anatomy. Carotid stenosis measurements (when applicable) are obtained utilizing NASCET criteria, using the distal internal carotid diameter as the denominator. CONTRAST:  23mL OMNIPAQUE IOHEXOL 350 MG/ML SOLN COMPARISON:  Head CT 03/17/2020 FINDINGS: CT HEAD FINDINGS Brain: There is no evidence of an acute infarct, intracranial hemorrhage, mass, midline shift, or extra-axial fluid collection. Confluent hypodensities in the cerebral white matter are similar to the prior CT and are nonspecific but compatible with severe chronic small vessel ischemic disease. There is mild cerebral atrophy. Vascular: Reported below. Skull: No fracture or suspicious osseous lesion. Sinuses: Visualized paranasal sinuses and mastoid air cells are clear. Orbits: Unremarkable. Review of the MIP images confirms the above findings CTA NECK FINDINGS Aortic arch: Standard 3 vessel aortic  arch with mild atherosclerotic plaque. No significant arch vessel origin stenosis. Right carotid system: Patent with a small amount of calcified plaque at the carotid bifurcation. No evidence of dissection or significant stenosis. Mild beading of the mid cervical ICA. Left carotid system: Patent without evidence of a dissection or significant stenosis. Retropharyngeal course of the distal common and proximal internal carotid arteries. Mild beading of the mid cervical ICA. 2 mm laterally directed outpouching from the ICA at the distal aspect of the beaded region consistent with a pseudoaneurysm (series 12, image 114). Vertebral arteries: Patent and codominant without evidence of a significant stenosis or dissection. Mild beading of the left V3 segment. Skeleton: Moderate cervical disc degeneration. Other neck: Status post thyroidectomy. Asymmetric fatty atrophy of the left parotid gland and right submandibular gland. Upper chest: No apical lung consolidation or mass. Review of the MIP images confirms the above findings CTA HEAD FINDINGS Anterior circulation: The internal carotid arteries are widely patent from skull base to carotid termini. There is developmental asymmetry with the right ICA being larger than the left due to absence of the left A1 segment, and there is also superimposed ectasia of the right cavernous ICA. ACAs and MCAs are patent without evidence of a proximal branch occlusion or significant proximal stenosis. No aneurysm is identified. Posterior circulation: The intracranial vertebral arteries are widely patent to the basilar. Patent left PICA, right AICA, and bilateral SCA origins are identified. The basilar artery is widely patent. Posterior communicating arteries are diminutive or absent. The PCAs are patent with mild asymmetric attenuation of distal right PCA branch vessels but no significant proximal stenosis. No aneurysm is identified. Venous sinuses: Patent. Anatomic variants: Left A1  aplasia. Review of the MIP images confirms the above findings IMPRESSION: 1. No evidence of acute intracranial abnormality. Severe chronic small vessel ischemia in the cerebral white matter. 2. No large vessel occlusion or significant proximal stenosis in the head or neck. 3. Beading of the cervical internal carotid arteries and left vertebral artery V3 segment consistent with fibromuscular dysplasia. 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 4. No major intracranial arterial occlusion or significant proximal stenosis. 5. Aortic Atherosclerosis (ICD10-I70.0). Electronically Signed   By: Logan Bores M.D.   On: 08/09/2020 18:05   MR BRAIN WO CONTRAST  Result Date: 08/09/2020 CLINICAL DATA:  Vision loss.  Right branch retinal artery occlusion. EXAM: MRI HEAD WITHOUT CONTRAST TECHNIQUE: Multiplanar, multiecho pulse sequences of the brain and surrounding structures were obtained without intravenous contrast. COMPARISON:  Head and neck CTA 08/09/2020 FINDINGS: Brain: There is no evidence of an acute infarct, intracranial hemorrhage, mass, midline shift, or extra-axial fluid collection. Confluent T2 hyperintensities in the cerebral white matter and pons  are nonspecific but compatible with severe chronic small vessel ischemic disease. Heterogeneous T2 hyperintensity is also present in the deep gray nuclei. There is mild cerebral atrophy. Vascular: Major intracranial vascular flow voids are preserved. Skull and upper cervical spine: Unremarkable bone marrow signal. Sinuses/Orbits: Unremarkable orbits. No significant inflammatory changes in the paranasal sinuses or mastoid air cells. Other: None. IMPRESSION: 1. No acute intracranial abnormality. 2. Severe chronic small vessel ischemic disease. Electronically Signed   By: Logan Bores M.D.   On: 08/09/2020 20:33   ECHOCARDIOGRAM COMPLETE  Result Date: 08/10/2020    ECHOCARDIOGRAM REPORT   Patient Name:   [name] [name] Date of Exam: 08/10/2020 Medical  Rec #:  [id number]       Height:       64.0 in Accession #:    [id number]      Weight:       318.3 lb Date of Birth:  04-Aug-1940       BSA:          2.384 m Patient Age:    41 years        BP:           150/88 mmHg Patient Gender: F               HR:           102 bpm. Exam Location:  Inpatient Procedure: 2D Echo, Cardiac Doppler and Color Doppler Indications:    Stroke  History:        Patient has no prior history of Echocardiogram examinations.                 Risk Factors:Hypertension.  Sonographer:    Cammy Brochure Referring Phys: [phone] Reiss Mowrey D Norval Slaven IMPRESSIONS  1. Left ventricular ejection fraction, by estimation, is 60 to 65%. The left ventricle has normal function. The left ventricle has no regional wall motion abnormalities. Left ventricular diastolic parameters are consistent with Grade I diastolic dysfunction (impaired relaxation).  2. Right ventricular systolic function is normal. The right ventricular size is normal. Tricuspid regurgitation signal is inadequate for assessing PA pressure.  3. The mitral valve is grossly normal. No evidence of mitral valve regurgitation. No evidence of mitral stenosis.  4. The aortic valve is tricuspid. Aortic valve regurgitation is not visualized. No aortic stenosis is present.  5. The inferior vena cava is normal in size with greater than 50% respiratory variability, suggesting right atrial pressure of 3 mmHg. Conclusion(s)/Recommendation(s): No intracardiac source of embolism detected on this transthoracic study. A transesophageal echocardiogram is recommended to exclude cardiac source of embolism if clinically indicated. FINDINGS  Left Ventricle: Left ventricular ejection fraction, by estimation, is 60 to 65%. The left ventricle has normal function. The left ventricle has no regional wall motion abnormalities. Definity contrast agent was given IV to delineate the left ventricular  endocardial borders. The left ventricular internal cavity size was normal in size.  There is no left ventricular hypertrophy. Left ventricular diastolic parameters are consistent with Grade I diastolic dysfunction (impaired relaxation). Right Ventricle: The right ventricular size is normal. No increase in right ventricular wall thickness. Right ventricular systolic function is normal. Tricuspid regurgitation signal is inadequate for assessing PA pressure. Left Atrium: Left atrial size was normal in size. Right Atrium: Right atrial size was normal in size. Pericardium: Trivial pericardial effusion is present. Presence of pericardial fat pad. Mitral Valve: The mitral valve is grossly normal. No evidence of mitral valve regurgitation. No evidence of mitral  valve stenosis. Tricuspid Valve: The tricuspid valve is grossly normal. Tricuspid valve regurgitation is not demonstrated. No evidence of tricuspid stenosis. Aortic Valve: The aortic valve is tricuspid. Aortic valve regurgitation is not visualized. No aortic stenosis is present. Aortic valve mean gradient measures 4.0 mmHg. Aortic valve peak gradient measures 7.8 mmHg. Aortic valve area, by VTI measures 3.27 cm. Pulmonic Valve: The pulmonic valve was grossly normal. Pulmonic valve regurgitation is not visualized. No evidence of pulmonic stenosis. Aorta: The aortic root and ascending aorta are structurally normal, with no evidence of dilitation. Venous: The inferior vena cava is normal in size with greater than 50% respiratory variability, suggesting right atrial pressure of 3 mmHg. IAS/Shunts: The atrial septum is grossly normal.  LEFT VENTRICLE PLAX 2D LVIDd:         3.50 cm  Diastology LVIDs:         2.30 cm  LV e' medial:    4.57 cm/s LV PW:         1.00 cm  LV E/e' medial:  14.7 LV IVS:        1.00 cm  LV e' lateral:   5.44 cm/s LVOT diam:     2.10 cm  LV E/e' lateral: 12.3 LV SV:         83 LV SV Index:   35 LVOT Area:     3.46 cm  RIGHT VENTRICLE RV S prime:     9.79 cm/s LEFT ATRIUM             Index       RIGHT ATRIUM          Index LA  diam:        2.60 cm 1.09 cm/m  RA Area:     8.75 cm LA Vol (A2C):   41.5 ml 17.41 ml/m RA Volume:   16.60 ml 6.96 ml/m LA Vol (A4C):   42.1 ml 17.66 ml/m LA Biplane Vol: 43.4 ml 18.21 ml/m  AORTIC VALVE AV Area (Vmax):    3.22 cm AV Area (Vmean):   3.19 cm AV Area (VTI):     3.27 cm AV Vmax:           140.00 cm/s AV Vmean:          97.700 cm/s AV VTI:            0.253 m AV Peak Grad:      7.8 mmHg AV Mean Grad:      4.0 mmHg LVOT Vmax:         130.00 cm/s LVOT Vmean:        90.000 cm/s LVOT VTI:          0.239 m LVOT/AV VTI ratio: 0.94  AORTA Ao Root diam: 3.10 cm Ao Asc diam:  2.80 cm MITRAL VALVE MV Area (PHT): 4.06 cm     SHUNTS MV Decel Time: 187 msec     Systemic VTI:  0.24 m MV E velocity: 67.00 cm/s   Systemic Diam: 2.10 cm MV A velocity: 112.00 cm/s MV E/A ratio:  0.60 Eleonore Chiquito MD Electronically signed by Eleonore Chiquito MD Signature Date/Time: 08/10/2020/2:44:41 PM    Final       Subjective: Overall right vision feels better but not completely resolved.  Still has an area of visual field abnormality which she reports is not dark as yesterday but more grayish.  Denies diplopia.  No other strokelike symptoms.  No headache this morning.  Discharge Exam:  Vitals:  08/10/20 0329 08/10/20 0821 08/10/20 1124 08/10/20 1553  BP: 137/89 (!) 150/88 125/83 (!) 146/94  Pulse: 91 (!) 102 95 (!) 104  Resp: 19 18 18    Temp: 98 F (36.7 C) 98.1 F (36.7 C) 97.8 F (36.6 C) 97.9 F (36.6 C)  TempSrc:  Oral Oral Oral  SpO2: 96% 98% 97% 98%  Weight: (!) 144.4 kg     Height:        General: Pt lying comfortably in bed & appears in no obvious distress. Cardiovascular: S1 & S2 heard, RRR, S1/S2 +. No murmurs, rubs, gallops or clicks. No JVD or pedal edema.  Telemetry personally reviewed: Sinus rhythm. Respiratory: Clear to auscultation without wheezing, rhonchi or crackles. No increased work of breathing. Abdominal:  Non distended, non tender & soft. No organomegaly or masses appreciated.  Normal bowel sounds heard. CNS: Alert and oriented. No focal deficits. Extremities: no edema, no cyanosis    The results of significant diagnostics from this hospitalization (including imaging, microbiology, ancillary and laboratory) are listed below for reference.     Microbiology: Recent Results (from the past 240 hour(s))  Resp Panel by RT-PCR (Flu A&B, Covid) Nasopharyngeal Swab     Status: None   Collection Time: 08/09/20  2:45 PM   Specimen: Nasopharyngeal Swab; Nasopharyngeal(NP) swabs in vial transport medium  Result Value Ref Range Status   SARS Coronavirus 2 by RT PCR NEGATIVE NEGATIVE Final    Comment: (NOTE) SARS-CoV-2 target nucleic acids are NOT DETECTED.  The SARS-CoV-2 RNA is generally detectable in upper respiratory specimens during the acute phase of infection. The lowest concentration of SARS-CoV-2 viral copies this assay can detect is 138 copies/mL. A negative result does not preclude SARS-Cov-2 infection and should not be used as the sole basis for treatment or other patient management decisions. A negative result may occur with  improper specimen collection/handling, submission of specimen other than nasopharyngeal swab, presence of viral mutation(s) within the areas targeted by this assay, and inadequate number of viral copies(<138 copies/mL). A negative result must be combined with clinical observations, patient history, and epidemiological information. The expected result is Negative.  Fact Sheet for Patients:  EntrepreneurPulse.com.au  Fact Sheet for Healthcare Providers:  IncredibleEmployment.be  This test is no t yet approved or cleared by the [place] FDA and  has been authorized for detection and/or diagnosis of SARS-CoV-2 by FDA under an Emergency Use Authorization (EUA). This EUA will remain  in effect (meaning this test can be used) for the duration of the COVID-19 declaration under Section 564(b)(1) of  the Act, 21 U.S.C.section 360bbb-3(b)(1), unless the authorization is terminated  or revoked sooner.       Influenza A by PCR NEGATIVE NEGATIVE Final   Influenza B by PCR NEGATIVE NEGATIVE Final    Comment: (NOTE) The Xpert Xpress SARS-CoV-2/FLU/RSV plus assay is intended as an aid in the diagnosis of influenza from Nasopharyngeal swab specimens and should not be used as a sole basis for treatment. Nasal washings and aspirates are unacceptable for Xpert Xpress SARS-CoV-2/FLU/RSV testing.  Fact Sheet for Patients: EntrepreneurPulse.com.au  Fact Sheet for Healthcare Providers: IncredibleEmployment.be  This test is not yet approved or cleared by the [place] FDA and has been authorized for detection and/or diagnosis of SARS-CoV-2 by FDA under an Emergency Use Authorization (EUA). This EUA will remain in effect (meaning this test can be used) for the duration of the COVID-19 declaration under Section 564(b)(1) of the Act, 21 U.S.C. section 360bbb-3(b)(1), unless the authorization is  terminated or revoked.  Performed at [facility], [place] [address], [place], [place] [address]      Labs: CBC: Recent Labs  Lab 08/09/20 1035 08/09/20 1451  WBC 4.4  --   NEUTROABS 1.9  --   HGB 12.3 12.9  HCT 39.0 38.0  MCV 90.9  --   PLT 306  --     Basic Metabolic Panel: Recent Labs  Lab 08/09/20 1035 08/09/20 1451  NA 135 143  K 3.9 4.7  CL 104 106  CO2 24  --   GLUCOSE 183* 90  BUN 17 25*  CREATININE 1.25* 1.10*  CALCIUM 9.9  --     Liver Function Tests: Recent Labs  Lab 08/09/20 1035  AST 23  ALT 13  ALKPHOS 64  BILITOT 0.6  PROT 7.4  ALBUMIN 3.9      Lipid Profile Recent Labs    08/10/20 0430  CHOL 134  HDL 37*  LDLCALC 44  TRIG 263*  CHOLHDL 3.6     Urinalysis    Component Value Date/Time   COLORURINE YELLOW 08/09/2020 1543   APPEARANCEUR CLEAR 08/09/2020 1543   LABSPEC 1.015 08/09/2020 1543    PHURINE 6.0 08/09/2020 1543   GLUCOSEU 50 (A) 08/09/2020 1543   GLUCOSEU NEGATIVE 06/05/2015 1559   HGBUR NEGATIVE 08/09/2020 1543   BILIRUBINUR NEGATIVE 08/09/2020 1543   KETONESUR NEGATIVE 08/09/2020 1543   PROTEINUR NEGATIVE 08/09/2020 1543   UROBILINOGEN 0.2 06/05/2015 1559   NITRITE NEGATIVE 08/09/2020 1543   LEUKOCYTESUR SMALL (A) 08/09/2020 1543      Time coordinating discharge: 25 minutes  SIGNED:  Vernell Leep, MD, FACP, [facility]. [facility]  To contact the attending provider between 7A-7P or the covering provider during after hours 7P-7A, please log into the web site [email or web address] and access using universal [place] password for that web site. If you do not have the password, please call the hospital operator.

## 2020-08-10 NOTE — Plan of Care (Signed)
  Problem: Education: Goal: Knowledge of disease or condition will improve Outcome: Progressing Goal: Knowledge of secondary prevention will improve Outcome: Progressing   

## 2020-08-10 NOTE — TOC Transition Note (Signed)
Transition of Care [facility]) - CM/SW Discharge Note   Patient Details  Name: [name] [name] MRN: [id number] Date of Birth: Mar 20, 1940  [facility] CM/SW Contact:  Pollie Friar, RN Phone Number: 08/10/2020, 4:40 PM   Clinical Narrative:    Patient discharging home with outpatient therapy. Pt preferred an outpatient therapy in [place]. Will fax orders to the outpatient therapy and they will contact the patient for the first appointment. Pt has supervision at home and transport to home.  Pt denies any issues with home meds.    Final next level of care: OP Rehab Barriers to Discharge: No Barriers Identified   Patient Goals and CMS Choice     Choice offered to / list presented to : Patient  Discharge Placement                       Discharge Plan and Services                                     Social Determinants of Health (SDOH) Interventions     Readmission Risk Interventions No flowsheet data found.

## 2020-08-10 NOTE — Progress Notes (Addendum)
STROKE TEAM PROGRESS NOTE   Interval History   No acute events overnight, patient is sitting up in bed. She was educated about her BRAO and informed about her imaging studies so far- CTA Head and Neck, MRI Brain. She will go for her echo later today.   She reports that the curtain to her right eye has decreased in size and she is able to see more.   Pertinent Lab Work and Imaging    08/09/20 CT Head WO IV Contrast Brain: There is no evidence of an acute infarct, intracranial hemorrhage, mass, midline shift, or extra-axial fluid collection. Confluent hypodensities in the cerebral white matter are similar to the prior CT and are nonspecific but compatible with severe chronic small vessel ischemic disease. There is mild cerebral atrophy.  08/09/20 CT Angio Head and Neck W WO IV Contrast 1. No evidence of acute intracranial abnormality. Severe chronic small vessel ischemia in the cerebral white matter. 2. No large vessel occlusion or significant proximal stenosis in the head or neck. 3. Beading of the cervical internal carotid arteries and left vertebral artery V3 segment consistent with fibromuscular dysplasia. 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 4. No major intracranial arterial occlusion or significant proximal stenosis.  08/09/20 MRI Brain WO IV Contrast 1. No acute intracranial abnormality. 2. Severe chronic small vessel ischemic disease.  08/10/20 Echocardiogram Complete  1. Left ventricular ejection fraction, by estimation, is 60 to 65%. The left ventricle has normal function. The left ventricle has no regional wall motion abnormalities. Left ventricular diastolic parameters are consistent with Grade I diastolic dysfunction (impaired relaxation).   2. Right ventricular systolic function is normal. The right ventricular size is normal. Tricuspid regurgitation signal is inadequate for assessing PA pressure.   3. The mitral valve is grossly normal. No evidence of mitral  valve regurgitation. No evidence of mitral stenosis.   4. The aortic valve is tricuspid. Aortic valve regurgitation is not visualized. No aortic stenosis is present.   5. The inferior vena cava is normal in size with greater than 50% respiratory variability, suggesting right atrial pressure of 3 mmHg.   Physical Examination   Constitutional: Calm, appropriate for condition  Cardiovascular: Normal RR Respiratory: No increased WOB   Mental status: AAOx4 Speech: Fluent with repetition and naming intact  Cranial nerves: PERRL, EOMI, vision partially obscured to the bottom half of the right eye, Face symmetric, Tongue midline  Motor: Normal bulk and tone. No drift. Full strength throughout  Sensory: Intact to light touch throughout  Coordination: Intact FNF HTS  Gait: Deferred  Assessment and Plan   [name] [name] is a 80 y.o. female w/pmh of breast cancer, hypertension, hyperlipidemia, depression, B12 deficiency and vitamin D deficiency who presents with a right branch retinal artery occlusion.   #BRAO  Patient presented with the symptoms described above. At this time, stroke work up is complete. MRI Brain was negative for acute stroke. CTA Head and Neck did not show significant stenosis. Her echocardiogram shows EF 60 to 65 %, LA normal in size. Stroke labs w/LDL 44, hemoglobin a1c pending. For BRAO treatment she is to take DAPT for 21 days followed by Aspirin monotherapy. Instructed to continue to take her Crestor 20 mg given it is effectively controlling her LDL  - DAPT for 21 days followed by ASA monotherapy ( started DAPT 6/30 end 08/31/20)  - Continue Crestor 20 mg for stroke prevention purposes  - Follow up with ophthalmology at discharge   #Hypertension  She has a history of HTN and takes at home. Currently blood pressure is trending 150-160 range. No need for permissive HTN given her sx started Monday.  - Resume home BP regimen   #Hyperlipidemia From a stroke prevention stand  point, the LDL goal is < 70. LDL is at goal, continue Crestor 20 mg QD.   #Stroke Diabetes Screening  Hemoglobin A1C this admission pending   Dispo She is ok to discharge from a neurology standpoint at this time given stroke work up is complete.   Neurology will sign off, please contact with any questions via Amion.  Hospital day # 0  Ruta Hinds, NP  [facility] Nurse Practitioner Patient seen and discussed with attending physician Dr. Erlinda Hong  ATTENDING NOTE: I reviewed above note and agree with the assessment and plan. Pt was seen and examined.   80 year old female with history of hypertension, hyperlipidemia, B12 deficiency, breast cancer admitted for right visual disturbance.  She was seen as outpatient by ophthalmology concerning for right BRAO, BRVO, macular edema.  Per patient, her ophthalmologist also concerning for retinal detachment and the plan to perform surgery on 09/08/2020.  Patient denies eye pain.  CT no acute abnormality.  CT head and neck unremarkable for stenosis but found to have FMD pattern.  MRI no acute infarct.  EF 60 to 65%.  CRP 0.5, ESR 20, LDL 44, A1c pending.  Creatinine 1.10.  On exam, patient neurologically intact except right eye central vision small band-like vision loss.  Etiology for patient symptoms concerning for right BRAO.  However, stroke work-up negative.  Likely ophthal source of the BRAO.  Per patient, her ophthalmologist also concerning for BRAO and retinal detachment.  She need close follow-up with ophthalmologist for further management.  Continue aspirin 81 and Plavix 75 DAPT for 3 weeks and then aspirin alone.  Continue Crestor 20 home medication.  Neurology will sign off. Please call with questions.  No neurology follow-up needed at this time, however patient need close follow-up with ophthalmology. Thanks for the consult.  For detailed assessment and plan, please refer to above as I have made changes wherever appropriate.   [name] Hawking, MD PhD Stroke Neurology 08/10/2020 5:50 PM     To contact Stroke Continuity provider, please refer to [email or web address] After hours, contact General Neurology

## 2020-08-10 NOTE — Evaluation (Signed)
Physical Therapy Evaluation Patient Details Name: [name] [name] MRN: [id number] DOB: 1940/07/10 Today's Date: 08/10/2020   History of Present Illness  80 y/o female presented to ED on 6/29 after awakening on 6/27 with half of her vision R eye (lower half) seemed to be "missing". Patient reports dizziness for months. Ophthalmogolical exam showed R branch retinal artery occlusion and R branch retinal vein occlusion with some macular edema. PMH: breast cancer, HTN, HLD, depression, B12 deficiency, thyroid cancer  Clinical Impression  PTA, patient lives with husband and reports independence with ambulation, however requires husband assistance for stairs and outdoor mobility. Patient presents with impaired balance, decreased activity tolerance, generalized weakness, impaired cognition. Patient requires up to [facility] for mobility with no AD due to balance deficits. No family/caregiver present to determine baseline cognition. Patient will benefit from skilled PT services during acute stay to address listed deficits. Recommend OPPT following discharge to maximize functional independence and safety.     Follow Up Recommendations Outpatient PT;Supervision for mobility/OOB    Equipment Recommendations  None recommended by PT    Recommendations for Other Services       Precautions / Restrictions Precautions Precautions: Fall Precaution Comments: R visual field deficit Restrictions Weight Bearing Restrictions: No      Mobility  Bed Mobility Overal bed mobility: Modified Independent                  Transfers Overall transfer level: Needs assistance   Transfers: Sit to/from Stand Sit to Stand: Min assist;Supervision         General transfer comment: minA initially to steady upon standing. Supervision for subsequent sit to stands  Ambulation/Gait Ambulation/Gait assistance: Min guard;Min assist Gait Distance (Feet): 200 Feet Assistive device: None Gait Pattern/deviations:  Step-through pattern;Decreased stride length Gait velocity: decreased   General Gait Details: numerous LOB throughout requiring minA at times and able to self correct at times  Stairs Stairs: Yes Stairs assistance: Min guard Stair Management: No rails;Step to pattern;Forwards Number of Stairs: 2 General stair comments: HHA for ascent and descent with min guard for safety  Wheelchair Mobility    Modified Rankin (Stroke Patients Only)       Balance Overall balance assessment: Mild deficits observed, not formally tested                                           Pertinent Vitals/Pain Pain Assessment: No/denies pain    Home Living Family/patient expects to be discharged to:: Private residence Living Arrangements: Spouse/significant other Available Help at Discharge: Family;Available 24 hours/day Type of Home: House Home Access: Stairs to enter Entrance Stairs-Rails: None Entrance Stairs-Number of Steps: 2 Home Layout: One level Home Equipment: Wheelchair - power;Youth worker - 2 wheels;Cane - single point      Prior Function Level of Independence: Independent         Comments: reports being "wobbly" and husband assists with stair negotiation. Reports fall x 1 in last 3 months     Hand Dominance        Extremity/Trunk Assessment   Upper Extremity Assessment Upper Extremity Assessment: Defer to OT evaluation    Lower Extremity Assessment Lower Extremity Assessment: Generalized weakness    Cervical / Trunk Assessment Cervical / Trunk Assessment: Normal  Communication   Communication: No difficulties  Cognition Arousal/Alertness: Awake/alert Behavior During Therapy: WFL for tasks assessed/performed Overall Cognitive Status: No family/caregiver  present to determine baseline cognitive functioning                                 General Comments: STM deficits noted. Unsure if baseline      General Comments       Exercises     Assessment/Plan    PT Assessment Patient needs continued PT services  PT Problem List Decreased strength;Decreased activity tolerance;Decreased balance;Decreased mobility;Decreased cognition;Decreased safety awareness       PT Treatment Interventions DME instruction;Gait training;Stair training;Functional mobility training;Therapeutic activities;Therapeutic exercise;Balance training;Patient/family education    PT Goals (Current goals can be found in the Care Plan section)  Acute Rehab PT Goals Patient Stated Goal: to go home PT Goal Formulation: With patient Time For Goal Achievement: 08/24/20 Potential to Achieve Goals: Good    Frequency Min 3X/week   Barriers to discharge        Co-evaluation               AM-PAC PT "6 Clicks" Mobility  Outcome Measure Help needed turning from your back to your side while in a flat bed without using bedrails?: None Help needed moving from lying on your back to sitting on the side of a flat bed without using bedrails?: None Help needed moving to and from a bed to a chair (including a wheelchair)?: A Little Help needed standing up from a chair using your arms (e.g., wheelchair or bedside chair)?: A Little Help needed to walk in hospital room?: A Little Help needed climbing 3-5 steps with a railing? : A Little 6 Click Score: 20    End of Session Equipment Utilized During Treatment: Gait belt Activity Tolerance: Patient tolerated treatment well Patient left: in bed;with call bell/phone within reach Nurse Communication: Mobility status PT Visit Diagnosis: Unsteadiness on feet (R26.81);Muscle weakness (generalized) (M62.81);History of falling (Z91.81)    Time: 2119-4174 PT Time Calculation (min) (ACUTE ONLY): 28 min   Charges:   PT Evaluation $PT Eval Low Complexity: 1 Low          Michaela Broski A. Gilford Rile PT, DPT Acute Rehabilitation Services Pager [phone] Office [phone]   Linna Hoff 08/10/2020,  9:13 AM

## 2020-08-10 NOTE — Evaluation (Signed)
Speech Language Pathology Evaluation Patient Details Name: [name] [name] MRN: [id number] DOB: Jun 26, 1940 Today's Date: 08/10/2020 Time: 7893-8101 SLP Time Calculation (min) (ACUTE ONLY): 22 min  Problem List:  Patient Active Problem List   Diagnosis Date Noted   BRAO (branch retinal artery occlusion) 08/09/2020   TIA (transient ischemic attack) 08/09/2020   Hyperparathyroidism ([place]) 06/13/2020   Skin lesion 03/21/2020   Cerebrovascular disease 03/21/2020   Pruritus 03/14/2020   Ataxia 07/20/2019   Seroma of breast 07/20/2019   Cerumen impaction 03/23/2019   Wrist pain 03/23/2019   Right ear pain 06/27/2018   Coronary artery disease 01/20/2018   Breast cyst, left 09/17/2017   Aortic atherosclerosis ([place]) 07/28/2017   Osteoporosis 05/07/2017   Genetic testing 12/05/2016   Family history of breast cancer    Port catheter in place 10/23/2016   Encounter for antineoplastic chemotherapy 10/23/2016   Malignant neoplasm of upper-outer quadrant of left breast in female, estrogen receptor positive ([place]) 07/29/2016   Well adult exam 06/05/2015   Neoplasm of uncertain behavior of skin 06/05/2015   Adenopathy, cervical 06/05/2015   Tachycardia 06/07/2014   Wart viral 10/18/2011   Hyperglycemia 11/06/2010   Actinic keratoses 05/25/2010   Essential hypertension 02/23/2010   SHOULDER PAIN 11/22/2009   Chronic fatigue 11/08/2009   RLQ PAIN 11/08/2009   VERTIGO 07/11/2009   ECZEMA 10/19/2008   CT, CHEST, ABNORMAL 05/30/2008   STYE 02/17/2008   B12 deficiency 12/16/2007   Vitamin D deficiency 12/16/2007   HYPOTHYROIDISM, POSTSURGICAL 12/01/2007   Acute maxillary sinusitis 11/11/2007   ALLERGIC RESPIRATORY DISEASE, EXTRINSIC 11/11/2007   THYROID CANCER 08/19/2007   Dyslipidemia 08/19/2007   Anxiety disorder 08/19/2007   Situational depression 08/19/2007   Allergic rhinitis 08/19/2007   Past Medical History:  Past Medical History:  Diagnosis Date   Allergic rhinitis    Anxiety     Breast cancer ([place]) hx 2004   recurrent 2006 DR Magrinat   Family history of breast cancer    Genetic testing 12/05/2016   Multi-Cancer panel (83 genes) @ [facility] - 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)   HTN (hypertension)    Hyperlipidemia    Hypothyroidism    Personal history of chemotherapy 2002   Personal history of radiation therapy 2002   Psoriasis    S/P thyroidectomy 07/2005   2 cm largest diameter (1 other tiny focus)/ i-131 rx 99 [facility]ci 08/2005   Thyroid cancer ([place])    Papillary Stage 1 - Dr Loanne Drilling   Vitamin B12 deficiency 2009   Vitamin D deficiency 2009   Past Surgical History:  Past Surgical History:  Procedure Laterality Date   BREAST LUMPECTOMY     BREAST LUMPECTOMY WITH RADIOACTIVE SEED AND SENTINEL LYMPH NODE BIOPSY Left 09/11/2016   Procedure: LEFT BREAST LUMPECTOMY WITH RADIOACTIVE SEED AND LEFT AXILLARY SENTINEL LYMPH NODE BIOPSY WITH BLUE DYE INJECTION;  Surgeon: [name], MD;  Location: [facility];  Service: General;  Laterality: Left;   IR FLUORO GUIDE PORT INSERTION RIGHT  09/13/2016   IR REMOVAL TUN ACCESS W/ PORT W/O FL MOD SED  12/30/2016   IR US GUIDE VASC ACCESS RIGHT  09/13/2016   MASTECTOMY Right    Right   PORTACATH PLACEMENT N/A 09/11/2016   Procedure: ATTEMPTED INSERTION PORT-A-CATH WITH ULTRA SOUND GUIDANCE;  Surgeon: [name], MD;  Location: [facility];  Service: General;  Laterality: N/A;   REDUCTION MAMMAPLASTY Left 2005   THYROIDECTOMY  2007   HPI:  80 y/o female presented to  ED on 6/29 after awakening on 6/27 with half of her vision R eye (lower half) seemed to be "missing". Patient reports dizziness for months. Ophthalmogolical exam showed R branch retinal artery occlusion and R branch retinal vein occlusion with some macular edema. PMH: breast cancer, HTN, HLD, depression, B12 deficiency, thyroid cancer 08/09/20 MRI head negative. SLE generated.  Assessment / Plan / Recommendation Clinical  Impression  Pt administered the SLUMS assessment ([place] Mental Status Examination) with a score of 29/30 obtained with 27/30 being considered normal for this test.  Pt with occasional word finding at baseline and this remains with pt independently using compensatory strategies to assist with this.  All other areas [facility] (attention, orientation, memory, auditory comprehension).  Speech is 100% intelligible within complex conversation.  Pt with residual visual deficits noted by opthalmologist's report. No ST needs identified at this time.  ST will s/o in acute setting.  Thank you for this consult.    SLP Assessment  SLP Recommendation/Assessment: Patient does not need any further Speech Lanaguage Pathology Services SLP Visit Diagnosis: Cognitive communication deficit (R41.841)    Follow Up Recommendations  None    Frequency and Duration  (evaluation only)         SLP Evaluation Cognition  Overall Cognitive Status: Within Functional Limits for tasks assessed Orientation Level: Oriented X4 Attention: Sustained Sustained Attention: Appears intact Memory: Appears intact Immediate Memory Recall: Sock;Blue;Bed Memory Recall Sock: Without Cue Memory Recall Blue: Without Cue Memory Recall Bed: Without Cue Awareness: Appears intact Problem Solving: Appears intact Safety/Judgment: Appears intact       Comprehension  Auditory Comprehension Overall Auditory Comprehension: Appears within functional limits for tasks assessed Conversation: Simple Visual Recognition/Discrimination Discrimination: Exceptions to [facility] Other Visual Recogniton/Discrimination Comments: visual field cut; lower quadrant Reading Comprehension Reading Status: Unable to assess (comment)    Expression Expression Primary Mode of Expression: Verbal Verbal Expression Overall Verbal Expression: Appears within functional limits for tasks assessed Level of Generative/Spontaneous Verbalization:  Conversation Repetition: No impairment Naming: Impairment Responsive: 76-100% accurate Confrontation: Within functional limits Convergent: 75-100% accurate Divergent: 75-100% accurate Other Naming Comments: baseline anomia intermittently Non-Verbal Means of Communication: Not applicable Written Expression Dominant Hand: Right Written Expression: Within Functional Limits   Oral / Motor  Oral Motor/Sensory Function Overall Oral Motor/Sensory Function: Within functional limits Motor Speech Overall Motor Speech: Appears within functional limits for tasks assessed Respiration: Within functional limits Phonation: Normal Resonance: Within functional limits Articulation: Within functional limitis Intelligibility: Intelligible Motor Planning: Witnin functional limits Motor Speech Errors: Not applicable                       Elvina Sidle, M.S., CCC-SLP 08/10/2020, 2:58 PM

## 2020-08-10 NOTE — Telephone Encounter (Signed)
The patient was admitted yesterday.  She can keep this appointment as a hospital follow-up appointment.  Thanks

## 2020-08-10 NOTE — Evaluation (Signed)
Occupational Therapy Evaluation Patient Details Name: [name] [name] MRN: [id number] DOB: [birth year] Today's Date: 08/10/2020    History of Present Illness 80 y/o female presented to ED on 6/29 after awakening on 6/27 with half of her vision R eye (lower half) seemed to be "missing". Patient reports dizziness for months. Ophthalmogolical exam showed R branch retinal artery occlusion and R branch retinal vein occlusion with some macular edema. PMH: breast cancer, HTN, HLD, depression, B12 deficiency, thyroid cancer   Clinical Impression   Patient admitted for the diagnosis above.  PTA she lives with her spouse, and was independent with all ADL and mobility, and continues to drive.  Patient is stating she is feeling much better, appears to be functioning well cognitively, and no unsteadiness noted with mobility in/out of the room.  She is scanning her environment well, and could consider a neuro opthalmologic eval post acute for further visual field testing.      Follow Up Recommendations  No OT follow up    Equipment Recommendations  None recommended by OT    Recommendations for Other Services       Precautions / Restrictions Precautions Precautions: Fall Precaution Comments: R visual field deficit Restrictions Weight Bearing Restrictions: No      Mobility Bed Mobility Overal bed mobility: Independent               Patient Response: Cooperative  Transfers Overall transfer level: Independent   Transfers: Sit to/from Stand Sit to Stand: Min assist;Supervision         General transfer comment: patient stating she is feeling better, and demonstrates no unsteadiness or LOB    Balance Overall balance assessment: No apparent balance deficits (not formally assessed)                                         ADL either performed or assessed with clinical judgement   ADL Overall ADL's : At baseline                                              Vision Baseline Vision/History: Wears glasses Wears Glasses: At all times Vision Assessment?: Yes Ocular Range of Motion: Within Functional Limits Alignment/Gaze Preference: Within Defined Limits Saccades: Within functional limits Visual Fields: Right inferior homonymous quadranopsia Additional Comments: patient is only missing a little on the lower visual field.     Perception     Praxis      Pertinent Vitals/Pain Pain Assessment: No/denies pain     Hand Dominance Right   Extremity/Trunk Assessment Upper Extremity Assessment Upper Extremity Assessment: Overall WFL for tasks assessed   Lower Extremity Assessment Lower Extremity Assessment: Defer to PT evaluation   Cervical / Trunk Assessment Cervical / Trunk Assessment: Normal   Communication Communication Communication: No difficulties   Cognition Arousal/Alertness: Awake/alert Behavior During Therapy: WFL for tasks assessed/performed Overall Cognitive Status: Within Functional Limits for tasks assessed                                 General Comments: Thought process, complex thought and memory tested, patient is WFL's.  Says she is feeling better versus PT eval.   General Comments  Exercises     Shoulder Instructions      Home Living Family/patient expects to be discharged to:: Private residence Living Arrangements: Spouse/significant other Available Help at Discharge: Family;Available 24 hours/day Type of Home: House Home Access: Stairs to enter [employer] of Steps: 2 Entrance Stairs-Rails: None Home Layout: One level     Bathroom Shower/Tub: Occupational psychologist: Standard     Home Equipment: Wheelchair - power;Youth worker - 2 wheels;Cane - single point          Prior Functioning/Environment Level of Independence: Independent        Comments: reports being "wobbly" and husband assists with stair negotiation. Reports fall x 1  in last 3 months        OT Problem List: Impaired vision/perception      OT Treatment/Interventions:      OT Goals(Current goals can be found in the care plan section) Acute Rehab OT Goals Patient Stated Goal: Hoping to head home this afternoon OT Goal Formulation: With patient Time For Goal Achievement: 08/10/20 Potential to Achieve Goals: Good  OT Frequency:     Barriers to D/C:  None noted          Co-evaluation              AM-PAC OT "6 Clicks" Daily Activity     Outcome Measure Help from another person eating meals?: None Help from another person taking care of personal grooming?: None Help from another person toileting, which includes using toliet, bedpan, or urinal?: None Help from another person bathing (including washing, rinsing, drying)?: None Help from another person to put on and taking off regular upper body clothing?: None Help from another person to put on and taking off regular lower body clothing?: None 6 Click Score: 24   End of Session Nurse Communication: Mobility status  Activity Tolerance: Patient tolerated treatment well Patient left: in bed  OT Visit Diagnosis: Unsteadiness on feet (R26.81)                Time: 0350-0938 OT Time Calculation (min): 17 min Charges:  OT General Charges $OT Visit: 1 Visit OT Evaluation $OT Eval Moderate Complexity: 1 Mod  08/10/2020  Rich, OTR/L  Acute Rehabilitation Services  Office:  [phone]   Metta Clines 08/10/2020, 10:34 AM

## 2020-08-10 NOTE — Care Management Obs Status (Signed)
MEDICARE OBSERVATION STATUS NOTIFICATION   Patient Details  Name: [name] [name] MRN: [id number] Date of Birth: 1940-04-10   Medicare Observation Status Notification Given:  Yes    Pollie Friar, RN 08/10/2020, 4:09 PM

## 2020-08-10 NOTE — Discharge Instructions (Signed)

## 2020-08-11 ENCOUNTER — Other Ambulatory Visit: Payer: Medicare Other

## 2020-08-11 ENCOUNTER — Ambulatory Visit: Payer: Self-pay

## 2020-08-11 ENCOUNTER — Telehealth: Payer: Self-pay | Admitting: Adult Health

## 2020-08-11 ENCOUNTER — Ambulatory Visit: Payer: Self-pay | Admitting: Adult Health

## 2020-08-11 ENCOUNTER — Telehealth: Payer: Self-pay | Admitting: Internal Medicine

## 2020-08-11 NOTE — Telephone Encounter (Signed)
R/s appts per 7/1 sch msg. Pt aware.  

## 2020-08-11 NOTE — Telephone Encounter (Signed)
    Patient requesting advice on taking medication today. She states upon discharge from the hospital her medications were changed

## 2020-08-11 NOTE — Telephone Encounter (Signed)
   Patient called and said that she took her medications and threw them up. Please advise

## 2020-08-15 ENCOUNTER — Ambulatory Visit: Payer: Medicare Other | Admitting: Internal Medicine

## 2020-08-16 ENCOUNTER — Ambulatory Visit (INDEPENDENT_AMBULATORY_CARE_PROVIDER_SITE_OTHER): Payer: Medicare Other | Admitting: Internal Medicine

## 2020-08-16 ENCOUNTER — Other Ambulatory Visit: Payer: Self-pay

## 2020-08-16 ENCOUNTER — Encounter: Payer: Self-pay | Admitting: Internal Medicine

## 2020-08-16 DIAGNOSIS — I679 Cerebrovascular disease, unspecified: Secondary | ICD-10-CM | POA: Diagnosis not present

## 2020-08-16 DIAGNOSIS — H34231 Retinal artery branch occlusion, right eye: Secondary | ICD-10-CM | POA: Diagnosis not present

## 2020-08-16 DIAGNOSIS — I700 Atherosclerosis of aorta: Secondary | ICD-10-CM | POA: Diagnosis not present

## 2020-08-16 DIAGNOSIS — R42 Dizziness and giddiness: Secondary | ICD-10-CM

## 2020-08-16 NOTE — Assessment & Plan Note (Signed)
6/22 CT -- Severe chronic small vessel ischemia in the cerebral white matter. ASA, Crestor, BP control Plavix x 21 d

## 2020-08-16 NOTE — Assessment & Plan Note (Signed)
On Crestor 

## 2020-08-16 NOTE — Progress Notes (Addendum)
Subjective:  Patient ID: [name] [name], female    DOB: 03/25/1940  Age: 80 y.o. MRN: [id number]  CC: Follow-up (ER follow-up- Pt states she ended up calling Dr. Katy Fitch office concerning her eyes. Was told to go to the ED and which she did. She states she does have 2 cataract in (R) eye, but wasn't sure about the detach retina)   HPI [name] [name] presents for R detached retina, cataracts B, ?TIA She was d/c'd on Plavix x 21 d and on ASA 81 mg/d.MRI - no CVA. CTA - ok. Severe chronic small vessel ischemia in the cerebral white matter on CT. Here for a post-hosp f/u Per d/c summary on 08/10/20:    "Admit date: 08/09/2020 Discharge date: 08/10/2020   Recommendations for Outpatient Follow-up:      [facility] Follow up.   Why: The outpatient therapy will contact you for the first appointment Contact information: [place], [place] [address]   Phone: [phone]            Mikena Masoner, Evie Lacks, MD. Schedule an appointment as soon as possible for a visit in 1 week(s).   Specialty: Internal Medicine Why: To be seen with repeat labs (CBC & BMP). Contact information: [place] [address] [phone]              Alroy Dust, MD. Schedule an appointment as soon as possible for a visit.   Why: Follow-up regarding your ophthalmology/eyes. Contact information: [place] [address] [phone]                            Home Health: Outpatient PT     Equipment/Devices: None     Discharge Condition: Improved and stable.   Code Status: Full Code Diet recommendation:  Discharge Diet Orders (From admission, onward)        Start     Ordered    08/10/20 0000   Diet - low sodium heart healthy        08/10/20 1638                  Discharge Diagnoses:  Active Problems:   Dyslipidemia   Essential hypertension   Malignant neoplasm of  upper-outer quadrant of left breast in female, estrogen receptor positive ([place])   Port catheter in place   Coronary artery disease   BRAO (branch retinal artery occlusion)   TIA (transient ischemic attack)     Brief Summary: 80 year old married female, medical history significant for breast cancer, hypertension, hyperlipidemia, depression, anxiety, B12 deficiency and vitamin D deficiency presented to the ED on 08/09/2020 for evaluation after ophthalmological exam showed a right branch retinal artery occlusion and branch right retinal vein occlusion with some macular edema.  She was reportedly well until 08/07/2020 when she suddenly started to notice that part of her vision-lower part on the right eye appeared as if she was looking through a curtain.  She was seen by an ophthalmologist who noted above findings and recommended that she follow-up outpatient and get stroke work-up within a week and come to the ER if the work-up outpatient could not be done in a week.  She decided to come to the ED.  She also reported right-sided temporal headache without jaw claudication.  She was admitted for evaluation and management of branch retinal artery and  vein occlusion.  Neurology was consulted.   Assessment and plan:  Branch retinal artery occlusion: Patient presented as noted above.  Neurology was consulted.  Stroke work-up was completed.  MRI brain negative for acute stroke.  CTA head and neck did not show any significant stenosis.  2D echo showed LVEF of 60-65%.  LDL 44.  A1c is pending and can be followed up outpatient during PCP visit.  As per neurology, recommended dual antiplatelets with aspirin 81 Mg daily and Plavix 75 Mg daily for 3 weeks followed by aspirin 81 Mg daily, continue Crestor at prior home dose of 20 mg daily and outpatient follow-up with ophthalmology and neurology.   Essential hypertension: Continue prior home meds.   Hyperlipidemia: LDL at goal.  Continue home dose of Crestor.    Prediabetes: Prior A1c of 6.1.  Follow repeat A1c results that were sent from the hospital.  Hypothyroid: Continue home dose of Synthroid and outpatient follow-up with PCP/endocrinology.   Anxiety and depression: Patient reports lots of family related stressors.  Close outpatient follow-up with PCP.   Breast cancer: Follows with Dr. Jana Hakim.  Continue Arimidex.     Consultations: Neurology   Procedures: None     Discharge Instructions   Discharge Instructions       Ambulatory referral to Neurology   Complete by: As directed      An appointment is requested in approximately: one month    Ambulatory referral to Physical Therapy   Complete by: As directed      Call MD for:   Complete by: As directed      Recurrent or worsening eye symptoms.    Diet - low sodium heart healthy   Complete by: As directed      Driving Restrictions   Complete by: As directed      Do not drive until follow-up with your family physician during office visit and then follow their recommendation.    Increase activity slowly   Complete by: As directed          "    Outpatient Medications Prior to Visit  Medication Sig Dispense Refill   ALPRAZolam (XANAX) 1 MG tablet TAKE 0.5-1 TABLETS (0.5-1 MG TOTAL) BY MOUTH 2 (TWO) TIMES DAILY AS NEEDED FOR ANXIETY. 60 tablet 1   anastrozole (ARIMIDEX) 1 MG tablet TAKE 1 TABLET BY MOUTH EVERY DAY 90 tablet 4   aspirin EC 81 MG EC tablet Take 1 tablet (81 mg total) by mouth daily. Swallow whole. 30 tablet 1   cholecalciferol (VITAMIN D) 1000 units tablet Take 2 tablets (2,000 Units total) by mouth daily. 100 tablet 3   clopidogrel (PLAVIX) 75 MG tablet Take 1 tablet (75 mg total) by mouth daily for 21 days. 21 tablet 0   levothyroxine (SYNTHROID) 112 MCG tablet TAKE 1 TABLET (112 MCG TOTAL) BY MOUTH DAILY. 90 tablet 2   rosuvastatin (CRESTOR) 20 MG tablet Take 1 tablet (20 mg total) by mouth daily. 30 tablet 1   telmisartan (MICARDIS) 40 MG tablet TAKE 1  TABLET BY MOUTH EVERY DAY 90 tablet 0   venlafaxine XR (EFFEXOR-XR) 75 MG 24 hr capsule TAKE 1 CAPSULE (75 MG TOTAL) BY MOUTH DAILY WITH BREAKFAST. 90 capsule 1   vitamin B-12 (CYANOCOBALAMIN) 1000 MCG tablet Take 1,000 mcg by mouth daily.     No facility-administered medications prior to visit.    ROS: Review of Systems  Constitutional:  Positive for fatigue. Negative for activity change, appetite change, chills and unexpected weight  change.  HENT:  Negative for congestion, mouth sores and sinus pressure.   Eyes:  Positive for visual disturbance. Negative for pain.  Respiratory:  Negative for cough and chest tightness.   Gastrointestinal:  Negative for abdominal pain and nausea.  Genitourinary:  Negative for difficulty urinating, frequency and vaginal pain.  Musculoskeletal:  Positive for arthralgias. Negative for back pain and gait problem.  Skin:  Negative for pallor and rash.  Neurological:  Positive for dizziness. Negative for tremors, weakness, numbness and headaches.  Psychiatric/Behavioral:  Positive for dysphoric mood and sleep disturbance. Negative for confusion and suicidal ideas. The patient is nervous/anxious.    Objective:  BP 98/72 (BP Location: Left Arm)   Pulse (!) 116   Temp 98.5 F (36.9 C) (Oral)   Ht 5\' 3"  (1.6 m)   Wt 145 lb 9.6 oz (66 kg)   SpO2 96%   BMI 25.79 kg/m   BP Readings from Last 3 Encounters:  08/16/20 98/72  08/10/20 (!) 146/94  06/13/20 108/70    Wt Readings from Last 3 Encounters:  08/16/20 145 lb 9.6 oz (66 kg)  08/10/20 (!) 318 lb 5.5 oz (144.4 kg)  06/13/20 147 lb 3.2 oz (66.8 kg)    Physical Exam Constitutional:      General: She is not in acute distress.    Appearance: She is well-developed. She is not ill-appearing.  HENT:     Head: Normocephalic.     Right Ear: External ear normal.     Left Ear: External ear normal.     Nose: Nose normal.  Eyes:     General:        Right eye: No discharge.        Left eye: No  discharge.     Conjunctiva/sclera: Conjunctivae normal.     Pupils: Pupils are equal, round, and reactive to light.  Neck:     Thyroid: No thyromegaly.     Vascular: No JVD.     Trachea: No tracheal deviation.  Cardiovascular:     Rate and Rhythm: Normal rate and regular rhythm.     Heart sounds: Normal heart sounds.  Pulmonary:     Effort: No respiratory distress.     Breath sounds: No stridor. No wheezing.  Abdominal:     General: Bowel sounds are normal. There is no distension.     Palpations: Abdomen is soft. There is no mass.     Tenderness: There is no abdominal tenderness. There is no guarding or rebound.  Musculoskeletal:        General: No tenderness.     Cervical back: Normal range of motion and neck supple. No rigidity.  Lymphadenopathy:     Cervical: No cervical adenopathy.  Skin:    Findings: No erythema or rash.  Neurological:     Cranial Nerves: No cranial nerve deficit.     Motor: No abnormal muscle tone.     Coordination: Coordination abnormal.     Gait: Gait abnormal.     Deep Tendon Reflexes: Reflexes normal.  Psychiatric:        Behavior: Behavior normal.        Thought Content: Thought content normal.        Judgment: Judgment normal.  Slightly ataxic gait No focal weakness. No focal cranial nerve deficit on exam  Lab Results  Component Value Date   WBC 4.4 08/09/2020   HGB 12.9 08/09/2020   HCT 38.0 08/09/2020   PLT 306 08/09/2020   GLUCOSE 90  08/09/2020   CHOL 134 08/10/2020   TRIG 263 (H) 08/10/2020   HDL 37 (L) 08/10/2020   LDLDIRECT 125.0 06/05/2015   LDLCALC 44 08/10/2020   ALT 13 08/09/2020   AST 23 08/09/2020   NA 143 08/09/2020   K 4.7 08/09/2020   CL 106 08/09/2020   CREATININE 1.10 (H) 08/09/2020   BUN 25 (H) 08/09/2020   CO2 24 08/09/2020   TSH 0.87 03/14/2020   INR 1.0 08/09/2020   HGBA1C 6.4 (H) 08/10/2020    CT Angio Head W or Wo Contrast  Result Date: 08/09/2020 CLINICAL DATA:  Branch retinal artery occlusion. EXAM:  CT ANGIOGRAPHY HEAD AND NECK TECHNIQUE: Multidetector CT imaging of the head and neck was performed using the standard protocol during bolus administration of intravenous contrast. Multiplanar CT image reconstructions and MIPs were obtained to evaluate the vascular anatomy. Carotid stenosis measurements (when applicable) are obtained utilizing NASCET criteria, using the distal internal carotid diameter as the denominator. CONTRAST:  7mL OMNIPAQUE IOHEXOL 350 MG/ML SOLN COMPARISON:  Head CT 03/17/2020 FINDINGS: CT HEAD FINDINGS Brain: There is no evidence of an acute infarct, intracranial hemorrhage, mass, midline shift, or extra-axial fluid collection. Confluent hypodensities in the cerebral white matter are similar to the prior CT and are nonspecific but compatible with severe chronic small vessel ischemic disease. There is mild cerebral atrophy. Vascular: Reported below. Skull: No fracture or suspicious osseous lesion. Sinuses: Visualized paranasal sinuses and mastoid air cells are clear. Orbits: Unremarkable. Review of the MIP images confirms the above findings CTA NECK FINDINGS Aortic arch: Standard 3 vessel aortic arch with mild atherosclerotic plaque. No significant arch vessel origin stenosis. Right carotid system: Patent with a small amount of calcified plaque at the carotid bifurcation. No evidence of dissection or significant stenosis. Mild beading of the mid cervical ICA. Left carotid system: Patent without evidence of a dissection or significant stenosis. Retropharyngeal course of the distal common and proximal internal carotid arteries. Mild beading of the mid cervical ICA. 2 mm laterally directed outpouching from the ICA at the distal aspect of the beaded region consistent with a pseudoaneurysm (series 12, image 114). Vertebral arteries: Patent and codominant without evidence of a significant stenosis or dissection. Mild beading of the left V3 segment. Skeleton: Moderate cervical disc degeneration.  Other neck: Status post thyroidectomy. Asymmetric fatty atrophy of the left parotid gland and right submandibular gland. Upper chest: No apical lung consolidation or mass. Review of the MIP images confirms the above findings CTA HEAD FINDINGS Anterior circulation: The internal carotid arteries are widely patent from skull base to carotid termini. There is developmental asymmetry with the right ICA being larger than the left due to absence of the left A1 segment, and there is also superimposed ectasia of the right cavernous ICA. ACAs and MCAs are patent without evidence of a proximal branch occlusion or significant proximal stenosis. No aneurysm is identified. Posterior circulation: The intracranial vertebral arteries are widely patent to the basilar. Patent left PICA, right AICA, and bilateral SCA origins are identified. The basilar artery is widely patent. Posterior communicating arteries are diminutive or absent. The PCAs are patent with mild asymmetric attenuation of distal right PCA branch vessels but no significant proximal stenosis. No aneurysm is identified. Venous sinuses: Patent. Anatomic variants: Left A1 aplasia. Review of the MIP images confirms the above findings IMPRESSION: 1. No evidence of acute intracranial abnormality. Severe chronic small vessel ischemia in the cerebral white matter. 2. No large vessel occlusion or significant proximal stenosis in  the head or neck. 3. Beading of the cervical internal carotid arteries and left vertebral artery V3 segment consistent with fibromuscular dysplasia. 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 4. No major intracranial arterial occlusion or significant proximal stenosis. 5. Aortic Atherosclerosis (ICD10-I70.0). Electronically Signed   By: Logan Bores M.D.   On: 08/09/2020 18:05   DG Chest 2 View  Result Date: 08/09/2020 CLINICAL DATA:  Weak and dizzy EXAM: CHEST - 2 VIEW COMPARISON:  07/09/2017 FINDINGS: Heart size and vascularity  normal. Lungs clear without infiltrate effusion or mass. Mild apical scarring bilaterally. Right mastectomy.  Surgical clips left breast. Moderate thoracic scoliosis. IMPRESSION: No active cardiopulmonary disease. Electronically Signed   By: Franchot Gallo M.D.   On: 08/09/2020 13:33   CT Angio Neck W and/or Wo Contrast  Result Date: 08/09/2020 CLINICAL DATA:  Branch retinal artery occlusion. EXAM: CT ANGIOGRAPHY HEAD AND NECK TECHNIQUE: Multidetector CT imaging of the head and neck was performed using the standard protocol during bolus administration of intravenous contrast. Multiplanar CT image reconstructions and MIPs were obtained to evaluate the vascular anatomy. Carotid stenosis measurements (when applicable) are obtained utilizing NASCET criteria, using the distal internal carotid diameter as the denominator. CONTRAST:  41mL OMNIPAQUE IOHEXOL 350 MG/ML SOLN COMPARISON:  Head CT 03/17/2020 FINDINGS: CT HEAD FINDINGS Brain: There is no evidence of an acute infarct, intracranial hemorrhage, mass, midline shift, or extra-axial fluid collection. Confluent hypodensities in the cerebral white matter are similar to the prior CT and are nonspecific but compatible with severe chronic small vessel ischemic disease. There is mild cerebral atrophy. Vascular: Reported below. Skull: No fracture or suspicious osseous lesion. Sinuses: Visualized paranasal sinuses and mastoid air cells are clear. Orbits: Unremarkable. Review of the MIP images confirms the above findings CTA NECK FINDINGS Aortic arch: Standard 3 vessel aortic arch with mild atherosclerotic plaque. No significant arch vessel origin stenosis. Right carotid system: Patent with a small amount of calcified plaque at the carotid bifurcation. No evidence of dissection or significant stenosis. Mild beading of the mid cervical ICA. Left carotid system: Patent without evidence of a dissection or significant stenosis. Retropharyngeal course of the distal common and  proximal internal carotid arteries. Mild beading of the mid cervical ICA. 2 mm laterally directed outpouching from the ICA at the distal aspect of the beaded region consistent with a pseudoaneurysm (series 12, image 114). Vertebral arteries: Patent and codominant without evidence of a significant stenosis or dissection. Mild beading of the left V3 segment. Skeleton: Moderate cervical disc degeneration. Other neck: Status post thyroidectomy. Asymmetric fatty atrophy of the left parotid gland and right submandibular gland. Upper chest: No apical lung consolidation or mass. Review of the MIP images confirms the above findings CTA HEAD FINDINGS Anterior circulation: The internal carotid arteries are widely patent from skull base to carotid termini. There is developmental asymmetry with the right ICA being larger than the left due to absence of the left A1 segment, and there is also superimposed ectasia of the right cavernous ICA. ACAs and MCAs are patent without evidence of a proximal branch occlusion or significant proximal stenosis. No aneurysm is identified. Posterior circulation: The intracranial vertebral arteries are widely patent to the basilar. Patent left PICA, right AICA, and bilateral SCA origins are identified. The basilar artery is widely patent. Posterior communicating arteries are diminutive or absent. The PCAs are patent with mild asymmetric attenuation of distal right PCA branch vessels but no significant proximal stenosis. No aneurysm is identified. Venous sinuses: Patent.  Anatomic variants: Left A1 aplasia. Review of the MIP images confirms the above findings IMPRESSION: 1. No evidence of acute intracranial abnormality. Severe chronic small vessel ischemia in the cerebral white matter. 2. No large vessel occlusion or significant proximal stenosis in the head or neck. 3. Beading of the cervical internal carotid arteries and left vertebral artery V3 segment consistent with fibromuscular dysplasia.  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 4. No major intracranial arterial occlusion or significant proximal stenosis. 5. Aortic Atherosclerosis (ICD10-I70.0). Electronically Signed   By: Logan Bores M.D.   On: 08/09/2020 18:05   MR BRAIN WO CONTRAST  Result Date: 08/09/2020 CLINICAL DATA:  Vision loss.  Right branch retinal artery occlusion. EXAM: MRI HEAD WITHOUT CONTRAST TECHNIQUE: Multiplanar, multiecho pulse sequences of the brain and surrounding structures were obtained without intravenous contrast. COMPARISON:  Head and neck CTA 08/09/2020 FINDINGS: Brain: There is no evidence of an acute infarct, intracranial hemorrhage, mass, midline shift, or extra-axial fluid collection. Confluent T2 hyperintensities in the cerebral white matter and pons are nonspecific but compatible with severe chronic small vessel ischemic disease. Heterogeneous T2 hyperintensity is also present in the deep gray nuclei. There is mild cerebral atrophy. Vascular: Major intracranial vascular flow voids are preserved. Skull and upper cervical spine: Unremarkable bone marrow signal. Sinuses/Orbits: Unremarkable orbits. No significant inflammatory changes in the paranasal sinuses or mastoid air cells. Other: None. IMPRESSION: 1. No acute intracranial abnormality. 2. Severe chronic small vessel ischemic disease. Electronically Signed   By: Logan Bores M.D.   On: 08/09/2020 20:33   ECHOCARDIOGRAM COMPLETE  Result Date: 08/10/2020    ECHOCARDIOGRAM REPORT   Patient Name:   [name] [name] Date of Exam: 08/10/2020 Medical Rec #:  [id number]       Height:       64.0 in Accession #:    [id number]      Weight:       318.3 lb Date of Birth:  November 30, 1940       BSA:          2.384 m Patient Age:    59 years        BP:           150/88 mmHg Patient Gender: F               HR:           102 bpm. Exam Location:  Inpatient Procedure: 2D Echo, Cardiac Doppler and Color Doppler Indications:    Stroke  History:        Patient has no  prior history of Echocardiogram examinations.                 Risk Factors:Hypertension.  Sonographer:    Cammy Brochure Referring Phys: [id number] ANAND D HONGALGI IMPRESSIONS  1. Left ventricular ejection fraction, by estimation, is 60 to 65%. The left ventricle has normal function. The left ventricle has no regional wall motion abnormalities. Left ventricular diastolic parameters are consistent with Grade I diastolic dysfunction (impaired relaxation).  2. Right ventricular systolic function is normal. The right ventricular size is normal. Tricuspid regurgitation signal is inadequate for assessing PA pressure.  3. The mitral valve is grossly normal. No evidence of mitral valve regurgitation. No evidence of mitral stenosis.  4. The aortic valve is tricuspid. Aortic valve regurgitation is not visualized. No aortic stenosis is present.  5. The inferior vena cava is normal in size with greater than 50% respiratory variability, suggesting right  atrial pressure of 3 mmHg. Conclusion(s)/Recommendation(s): No intracardiac source of embolism detected on this transthoracic study. A transesophageal echocardiogram is recommended to exclude cardiac source of embolism if clinically indicated. FINDINGS  Left Ventricle: Left ventricular ejection fraction, by estimation, is 60 to 65%. The left ventricle has normal function. The left ventricle has no regional wall motion abnormalities. Definity contrast agent was given IV to delineate the left ventricular  endocardial borders. The left ventricular internal cavity size was normal in size. There is no left ventricular hypertrophy. Left ventricular diastolic parameters are consistent with Grade I diastolic dysfunction (impaired relaxation). Right Ventricle: The right ventricular size is normal. No increase in right ventricular wall thickness. Right ventricular systolic function is normal. Tricuspid regurgitation signal is inadequate for assessing PA pressure. Left Atrium: Left atrial  size was normal in size. Right Atrium: Right atrial size was normal in size. Pericardium: Trivial pericardial effusion is present. Presence of pericardial fat pad. Mitral Valve: The mitral valve is grossly normal. No evidence of mitral valve regurgitation. No evidence of mitral valve stenosis. Tricuspid Valve: The tricuspid valve is grossly normal. Tricuspid valve regurgitation is not demonstrated. No evidence of tricuspid stenosis. Aortic Valve: The aortic valve is tricuspid. Aortic valve regurgitation is not visualized. No aortic stenosis is present. Aortic valve mean gradient measures 4.0 mmHg. Aortic valve peak gradient measures 7.8 mmHg. Aortic valve area, by VTI measures 3.27 cm. Pulmonic Valve: The pulmonic valve was grossly normal. Pulmonic valve regurgitation is not visualized. No evidence of pulmonic stenosis. Aorta: The aortic root and ascending aorta are structurally normal, with no evidence of dilitation. Venous: The inferior vena cava is normal in size with greater than 50% respiratory variability, suggesting right atrial pressure of 3 mmHg. IAS/Shunts: The atrial septum is grossly normal.  LEFT VENTRICLE PLAX 2D LVIDd:         3.50 cm  Diastology LVIDs:         2.30 cm  LV e' medial:    4.57 cm/s LV PW:         1.00 cm  LV E/e' medial:  14.7 LV IVS:        1.00 cm  LV e' lateral:   5.44 cm/s LVOT diam:     2.10 cm  LV E/e' lateral: 12.3 LV SV:         83 LV SV Index:   35 LVOT Area:     3.46 cm  RIGHT VENTRICLE RV S prime:     9.79 cm/s LEFT ATRIUM             Index       RIGHT ATRIUM          Index LA diam:        2.60 cm 1.09 cm/m  RA Area:     8.75 cm LA Vol (A2C):   41.5 ml 17.41 ml/m RA Volume:   16.60 ml 6.96 ml/m LA Vol (A4C):   42.1 ml 17.66 ml/m LA Biplane Vol: 43.4 ml 18.21 ml/m  AORTIC VALVE AV Area (Vmax):    3.22 cm AV Area (Vmean):   3.19 cm AV Area (VTI):     3.27 cm AV Vmax:           140.00 cm/s AV Vmean:          97.700 cm/s AV VTI:            0.253 m AV Peak Grad:       7.8 mmHg AV Mean Grad:  4.0 mmHg LVOT Vmax:         130.00 cm/s LVOT Vmean:        90.000 cm/s LVOT VTI:          0.239 m LVOT/AV VTI ratio: 0.94  AORTA Ao Root diam: 3.10 cm Ao Asc diam:  2.80 cm MITRAL VALVE MV Area (PHT): 4.06 cm     SHUNTS MV Decel Time: 187 msec     Systemic VTI:  0.24 m MV E velocity: 67.00 cm/s   Systemic Diam: 2.10 cm MV A velocity: 112.00 cm/s MV E/A ratio:  0.60 Eleonore Chiquito MD Electronically signed by Eleonore Chiquito MD Signature Date/Time: 08/10/2020/2:44:41 PM    Final     Assessment & Plan:     Walker Kehr, MD

## 2020-08-16 NOTE — Assessment & Plan Note (Signed)
F/u w/Dr Katy Fitch On Plavix x 21 d ASA 81 mg/d

## 2020-08-18 ENCOUNTER — Ambulatory Visit: Payer: Self-pay

## 2020-08-18 ENCOUNTER — Telehealth: Payer: Self-pay | Admitting: Adult Health

## 2020-08-18 ENCOUNTER — Ambulatory Visit: Payer: Self-pay | Admitting: Adult Health

## 2020-08-18 ENCOUNTER — Other Ambulatory Visit: Payer: Medicare Other

## 2020-08-18 NOTE — Telephone Encounter (Signed)
Called pt to r/s appts pe r7/8 sch msg. No answer. Left msg for pt to call back to r/s.

## 2020-08-21 ENCOUNTER — Encounter (HOSPITAL_COMMUNITY): Payer: Self-pay

## 2020-08-21 ENCOUNTER — Emergency Department (HOSPITAL_COMMUNITY)
Admission: EM | Admit: 2020-08-21 | Discharge: 2020-08-21 | Disposition: A | Discharge status: Home / Self Care | Payer: Medicare Other | Attending: Emergency Medicine | Admitting: Emergency Medicine

## 2020-08-21 ENCOUNTER — Ambulatory Visit (HOSPITAL_COMMUNITY)
Admission: EM | Admit: 2020-08-21 | Discharge: 2020-08-21 | Disposition: A | Discharge status: Other Acute Inpatient Hospital | Payer: Medicare Other

## 2020-08-21 ENCOUNTER — Emergency Department (HOSPITAL_COMMUNITY): Payer: Medicare Other

## 2020-08-21 ENCOUNTER — Other Ambulatory Visit: Payer: Self-pay

## 2020-08-21 DIAGNOSIS — Z9012 Acquired absence of left breast and nipple: Secondary | ICD-10-CM | POA: Insufficient documentation

## 2020-08-21 DIAGNOSIS — Z7982 Long term (current) use of aspirin: Secondary | ICD-10-CM | POA: Insufficient documentation

## 2020-08-21 DIAGNOSIS — I10 Essential (primary) hypertension: Secondary | ICD-10-CM | POA: Diagnosis not present

## 2020-08-21 DIAGNOSIS — Z853 Personal history of malignant neoplasm of breast: Secondary | ICD-10-CM | POA: Diagnosis not present

## 2020-08-21 DIAGNOSIS — I639 Cerebral infarction, unspecified: Secondary | ICD-10-CM | POA: Diagnosis not present

## 2020-08-21 DIAGNOSIS — Z85850 Personal history of malignant neoplasm of thyroid: Secondary | ICD-10-CM | POA: Diagnosis not present

## 2020-08-21 DIAGNOSIS — R42 Dizziness and giddiness: Secondary | ICD-10-CM | POA: Diagnosis not present

## 2020-08-21 DIAGNOSIS — Z7902 Long term (current) use of antithrombotics/antiplatelets: Secondary | ICD-10-CM | POA: Diagnosis not present

## 2020-08-21 DIAGNOSIS — R531 Weakness: Secondary | ICD-10-CM | POA: Diagnosis not present

## 2020-08-21 DIAGNOSIS — Z79899 Other long term (current) drug therapy: Secondary | ICD-10-CM | POA: Insufficient documentation

## 2020-08-21 DIAGNOSIS — H8110 Benign paroxysmal vertigo, unspecified ear: Secondary | ICD-10-CM | POA: Diagnosis not present

## 2020-08-21 DIAGNOSIS — R0902 Hypoxemia: Secondary | ICD-10-CM | POA: Diagnosis not present

## 2020-08-21 DIAGNOSIS — G459 Transient cerebral ischemic attack, unspecified: Secondary | ICD-10-CM | POA: Diagnosis not present

## 2020-08-21 DIAGNOSIS — E039 Hypothyroidism, unspecified: Secondary | ICD-10-CM | POA: Insufficient documentation

## 2020-08-21 LAB — CBC
HCT: 36.9 % (ref 36.0–46.0)
Hemoglobin: 12 g/dL (ref 12.0–15.0)
MCH: 29.4 pg (ref 26.0–34.0)
MCHC: 32.5 g/dL (ref 30.0–36.0)
MCV: 90.4 fL (ref 80.0–100.0)
Platelets: 249 10*3/uL (ref 150–400)
RBC: 4.08 MIL/uL (ref 3.87–5.11)
RDW: 13.6 % (ref 11.5–15.5)
WBC: 4.4 10*3/uL (ref 4.0–10.5)
nRBC: 0 % (ref 0.0–0.2)

## 2020-08-21 LAB — DIFFERENTIAL
Abs Immature Granulocytes: 0.02 10*3/uL (ref 0.00–0.07)
Basophils Absolute: 0 10*3/uL (ref 0.0–0.1)
Basophils Relative: 1 %
Eosinophils Absolute: 0.1 10*3/uL (ref 0.0–0.5)
Eosinophils Relative: 3 %
Immature Granulocytes: 1 %
Lymphocytes Relative: 44 %
Lymphs Abs: 2 10*3/uL (ref 0.7–4.0)
Monocytes Absolute: 0.6 10*3/uL (ref 0.1–1.0)
Monocytes Relative: 13 %
Neutro Abs: 1.7 10*3/uL (ref 1.7–7.7)
Neutrophils Relative %: 38 %

## 2020-08-21 LAB — COMPREHENSIVE METABOLIC PANEL
ALT: 16 U/L (ref 0–44)
AST: 27 U/L (ref 15–41)
Albumin: 3.7 g/dL (ref 3.5–5.0)
Alkaline Phosphatase: 61 U/L (ref 38–126)
Anion gap: 9 (ref 5–15)
BUN: 14 mg/dL (ref 8–23)
CO2: 24 mmol/L (ref 22–32)
Calcium: 9.9 mg/dL (ref 8.9–10.3)
Chloride: 105 mmol/L (ref 98–111)
Creatinine, Ser: 1.1 mg/dL — ABNORMAL HIGH (ref 0.44–1.00)
GFR, Estimated: 50 mL/min — ABNORMAL LOW (ref >60)
Glucose, Bld: 126 mg/dL — ABNORMAL HIGH (ref 70–99)
Potassium: 3.9 mmol/L (ref 3.5–5.1)
Sodium: 138 mmol/L (ref 135–145)
Total Bilirubin: 0.7 mg/dL (ref 0.3–1.2)
Total Protein: 6.8 g/dL (ref 6.5–8.1)

## 2020-08-21 LAB — I-STAT CHEM 8, ED
BUN: 15 mg/dL (ref 8–23)
Calcium, Ion: 1.29 mmol/L (ref 1.15–1.40)
Chloride: 104 mmol/L (ref 98–111)
Creatinine, Ser: 1 mg/dL (ref 0.44–1.00)
Glucose, Bld: 123 mg/dL — ABNORMAL HIGH (ref 70–99)
HCT: 36 % (ref 36.0–46.0)
Hemoglobin: 12.2 g/dL (ref 12.0–15.0)
Potassium: 3.8 mmol/L (ref 3.5–5.1)
Sodium: 139 mmol/L (ref 135–145)
TCO2: 25 mmol/L (ref 22–32)

## 2020-08-21 LAB — APTT: aPTT: 23 seconds — ABNORMAL LOW (ref 24–36)

## 2020-08-21 LAB — PROTIME-INR
INR: 1 (ref 0.8–1.2)
Prothrombin Time: 13.2 seconds (ref 11.4–15.2)

## 2020-08-21 LAB — CBG MONITORING, ED: Glucose-Capillary: 117 mg/dL — ABNORMAL HIGH (ref 70–99)

## 2020-08-21 MED ORDER — MECLIZINE HCL 25 MG PO TABS: 25.0000 mg | ORAL_TABLET | Freq: Three times a day (TID) | ORAL | 0 refills | Status: DC | PRN

## 2020-08-21 MED ORDER — SODIUM CHLORIDE 0.9% FLUSH: 3.0000 mL | Freq: Once | INTRAVENOUS | Status: DC

## 2020-08-21 MED ORDER — GADOBUTROL 1 MMOL/ML IV SOLN
6.5000 mL | Freq: Once | INTRAVENOUS | Status: AC | PRN
  Administered 2020-08-21: 6.5 mL via INTRAVENOUS

## 2020-08-21 MED ORDER — MECLIZINE HCL 25 MG PO TABS
25.0000 mg | ORAL_TABLET | Freq: Once | ORAL | Status: AC
  Administered 2020-08-21: 25 mg via ORAL
  Filled 2020-08-21: qty 1

## 2020-08-21 NOTE — ED Provider Notes (Signed)
[facility] EMERGENCY DEPARTMENT Provider Note   CSN: [id number] Arrival date & time: 08/21/20  1314  An emergency department physician performed an initial assessment on this suspected stroke patient at 1314.  History Chief Complaint  Patient presents with   Code Stroke   Dizziness    [name] [name] is a 80 y.o. female.  [name] [name] is a 80 y.o. female with a history of hypertension, hyperlipidemia, hypothyroidism, breast cancer, CRAO, TIA, who presents from a [facility] urgent care via EMS as code stroke activated by urgent care at 1249.  Patient had sudden onset of dizziness at 930 this morning which is also her last known well time.  She reports she had persistent room spinning sensation that lasted for about 3 hours which is what prompted her to go to urgent care.  At urgent care she was noted to be leaning to the left slightly.  Patient reports she does have a known history of vertigo for which she typically takes meclizine but had run out of this.  By the time patient arrived to the emergency department her dizziness had completely resolved.  She denies having any associated visual changes, headache, numbness or weakness.  Per urgent care note patient was having some difficulty walking at urgent care, leaning to the left and needing assistance.  They had also noted some decreased right grip strength.  Patient denies any associated chest pain or shortness of breath, no lightheadedness or syncope.  No other aggravating or alleviating factors.  Evaluated with neurology team upon arrival.  The history is provided by the patient.      Past Medical History:  Diagnosis Date   Allergic rhinitis    Anxiety    Breast cancer ([place]) hx 2004   recurrent 2006 DR Magrinat   Family history of breast cancer    Genetic testing 12/05/2016   Multi-Cancer panel (83 genes) @ [facility] - Monoallelic mutation in [place] (carrier)   HTN (hypertension)    Hyperlipidemia     Hypothyroidism    Personal history of chemotherapy 2002   Personal history of radiation therapy 2002   Psoriasis    S/P thyroidectomy 07/2005   2 cm largest diameter (1 other tiny focus)/ i-131 rx 99 mci 08/2005   Thyroid cancer ([place])    Papillary Stage 1 - Dr Loanne Drilling   Vitamin B12 deficiency 2009   Vitamin D deficiency 2009    Patient Active Problem List   Diagnosis Date Noted   Retinal artery occlusion, branch, right 08/09/2020   TIA (transient ischemic attack) 08/09/2020   Hyperparathyroidism ([place]) 06/13/2020   Skin lesion 03/21/2020   Cerebrovascular disease 03/21/2020   Pruritus 03/14/2020   Ataxia 07/20/2019   Seroma of breast 07/20/2019   Cerumen impaction 03/23/2019   Wrist pain 03/23/2019   Right ear pain 06/27/2018   Coronary artery disease 01/20/2018   Breast cyst, left 09/17/2017   Aortic atherosclerosis ([place]) 07/28/2017   Osteoporosis 05/07/2017   Genetic testing 12/05/2016   Family history of breast cancer    Port catheter in place 10/23/2016   Encounter for antineoplastic chemotherapy 10/23/2016   Malignant neoplasm of upper-outer quadrant of left breast in female, estrogen receptor positive ([place]) 07/29/2016   Well adult exam 06/05/2015   Neoplasm of uncertain behavior of skin 06/05/2015   Adenopathy, cervical 06/05/2015   Tachycardia 06/07/2014   Wart viral 10/18/2011   Hyperglycemia 11/06/2010   Actinic keratoses 05/25/2010   Essential hypertension 02/23/2010   SHOULDER PAIN  11/22/2009   Chronic fatigue 11/08/2009   RLQ PAIN 11/08/2009   VERTIGO 07/11/2009   ECZEMA 10/19/2008   CT, CHEST, ABNORMAL 05/30/2008   STYE 02/17/2008   B12 deficiency 12/16/2007   Vitamin D deficiency 12/16/2007   HYPOTHYROIDISM, POSTSURGICAL 12/01/2007   Acute maxillary sinusitis 11/11/2007   ALLERGIC RESPIRATORY DISEASE, EXTRINSIC 11/11/2007   THYROID CANCER 08/19/2007   Dyslipidemia 08/19/2007   Anxiety disorder 08/19/2007   Situational depression 08/19/2007    Allergic rhinitis 08/19/2007    Past Surgical History:  Procedure Laterality Date   BREAST LUMPECTOMY     BREAST LUMPECTOMY WITH RADIOACTIVE SEED AND SENTINEL LYMPH NODE BIOPSY Left 09/11/2016   Procedure: LEFT BREAST LUMPECTOMY WITH RADIOACTIVE SEED AND LEFT AXILLARY SENTINEL LYMPH NODE BIOPSY WITH BLUE DYE INJECTION;  Surgeon: [name], MD;  Location: [facility];  Service: General;  Laterality: Left;   IR FLUORO GUIDE PORT INSERTION RIGHT  09/13/2016   IR REMOVAL TUN ACCESS W/ PORT W/O FL MOD SED  12/30/2016   IR US GUIDE VASC ACCESS RIGHT  09/13/2016   MASTECTOMY Right    Right   PORTACATH PLACEMENT N/A 09/11/2016   Procedure: ATTEMPTED INSERTION PORT-A-CATH WITH ULTRA SOUND GUIDANCE;  Surgeon: [name], MD;  Location: [facility];  Service: General;  Laterality: N/A;   REDUCTION MAMMAPLASTY Left 2005   THYROIDECTOMY  2007     OB History   No obstetric history on file.     Family History  Problem Relation Age of Onset   Stroke Mother    Allergies Mother    Asthma Mother    Clotting disorder Mother    Heart disease Father 86       MI   Allergies Sister    Asthma Sister    Breast cancer Sister 24       Lymphoma 49; currently 65   Lymphoma Sister    Hyperparathyroidism Sister    Asthma Brother    Leukemia Brother        dx 71s   Allergies Daughter    Asthma Daughter    Allergies Sister    Breast cancer Sister 27       currently 52   Kidney cancer Paternal Aunt        kidney ca; deceased 53   Liver cancer Paternal Uncle        unk. primary ("liver")   Throat cancer Maternal Grandmother        deceased 65   Lung cancer Maternal Grandfather        deceased 83   Pancreatic cancer Paternal Grandfather        deceased 8   Cancer Paternal Aunt        "abdominal"; deceased 67    Social History   Tobacco Use   Smoking status: Never   Smokeless tobacco: Never  Substance Use Topics   Alcohol use: No   Drug use: No     Home Medications Prior to Admission medications   Medication Sig Start Date End Date Taking? Authorizing Provider  ALPRAZolam (XANAX) 1 MG tablet TAKE 0.5-1 TABLETS (0.5-1 MG TOTAL) BY MOUTH 2 (TWO) TIMES DAILY AS NEEDED FOR ANXIETY. 03/15/20   Plotnikov, Evie Lacks, MD  anastrozole (ARIMIDEX) 1 MG tablet TAKE 1 TABLET BY MOUTH EVERY DAY 08/31/19   Magrinat, [name], MD  aspirin EC 81 MG EC tablet Take 1 tablet (81 mg total) by mouth daily. Swallow whole. 08/11/20   Hongalgi, Lenis Dickinson, MD  cholecalciferol (VITAMIN D) 1000 units tablet Take 2 tablets (2,000 Units total) by mouth daily. 06/19/17   Plotnikov, Evie Lacks, MD  clopidogrel (PLAVIX) 75 MG tablet Take 1 tablet (75 mg total) by mouth daily for 21 days. 08/11/20 09/01/20  Modena Jansky, MD  levothyroxine (SYNTHROID) 112 MCG tablet TAKE 1 TABLET (112 MCG TOTAL) BY MOUTH DAILY. 12/27/19   Plotnikov, Evie Lacks, MD  rosuvastatin (CRESTOR) 20 MG tablet Take 1 tablet (20 mg total) by mouth daily. 08/10/20   Hongalgi, Lenis Dickinson, MD  telmisartan (MICARDIS) 40 MG tablet TAKE 1 TABLET BY MOUTH EVERY DAY 12/24/19   Plotnikov, Evie Lacks, MD  venlafaxine XR (EFFEXOR-XR) 75 MG 24 hr capsule TAKE 1 CAPSULE (75 MG TOTAL) BY MOUTH DAILY WITH BREAKFAST. 08/10/20   Plotnikov, Evie Lacks, MD  vitamin B-12 (CYANOCOBALAMIN) 1000 MCG tablet Take 1,000 mcg by mouth daily.    [provider]    Allergies    Pneumococcal vaccines, Sulfa antibiotics, Sulfacetamide sodium-sulfur, Tape, and Tegaderm ag mesh [silver]  Review of Systems   Review of Systems  Constitutional:  Negative for chills and fever.  HENT: Negative.    Respiratory:  Negative for cough and shortness of breath.   Cardiovascular:  Negative for chest pain.  Gastrointestinal:  Negative for abdominal pain, nausea and vomiting.  Genitourinary:  Negative for dysuria.  Musculoskeletal:  Negative for arthralgias and myalgias.  Skin:  Negative for color change and rash.  Neurological:  Positive for  dizziness. Negative for tremors, seizures, syncope, facial asymmetry, speech difficulty, weakness, light-headedness, numbness and headaches.   Physical Exam Updated Vital Signs BP (!) 145/86 (BP Location: Right Arm)   Pulse 82   Temp 98.1 F (36.7 C)   Resp 17   Ht 5\' 3"  (1.6 m)   Wt 69 kg   SpO2 100%   BMI 26.95 kg/m   Physical Exam Vitals and nursing note reviewed.  Constitutional:      General: She is not in acute distress.    Appearance: Normal appearance. She is well-developed. She is not ill-appearing or diaphoretic.  HENT:     Head: Normocephalic and atraumatic.     Mouth/Throat:     Mouth: Mucous membranes are moist.     Pharynx: Oropharynx is clear.  Eyes:     General:        Right eye: No discharge.        Left eye: No discharge.     Extraocular Movements: Extraocular movements intact.     Pupils: Pupils are equal, round, and reactive to light.     Comments: No nystagmus  Cardiovascular:     Rate and Rhythm: Normal rate and regular rhythm.     Pulses: Normal pulses.     Heart sounds: Normal heart sounds. No murmur heard.   No gallop.  Pulmonary:     Effort: Pulmonary effort is normal. No respiratory distress.     Breath sounds: Normal breath sounds. No wheezing or rales.     Comments: Respirations equal and unlabored, patient able to speak in full sentences, lungs clear to auscultation bilaterally  Abdominal:     General: Bowel sounds are normal. There is no distension.     Palpations: Abdomen is soft. There is no mass.     Tenderness: There is no abdominal tenderness. There is no guarding.     Comments: Abdomen soft, nondistended, nontender to palpation in all quadrants without guarding or peritoneal signs  Musculoskeletal:  General: No deformity.     Cervical back: Neck supple.  Skin:    General: Skin is warm and dry.     Capillary Refill: Capillary refill takes less than 2 seconds.  Neurological:     Mental Status: She is alert and oriented to  person, place, and time.     Coordination: Coordination normal.     Comments: Speech is clear, able to follow commands CN III-XII intact Normal strength in upper and lower extremities bilaterally including dorsiflexion and plantar flexion, strong and equal grip strength Sensation normal to light and sharp touch Moves extremities without ataxia, coordination intact  Psychiatric:        Mood and Affect: Mood normal.        Behavior: Behavior normal.    ED Results / Procedures / Treatments   Labs (all labs ordered are listed, but only abnormal results are displayed) Labs Reviewed  APTT - Abnormal; Notable for the following components:      Result Value   aPTT 23 (*)    All other components within normal limits  COMPREHENSIVE METABOLIC PANEL - Abnormal; Notable for the following components:   Glucose, Bld 126 (*)    Creatinine, Ser 1.10 (*)    GFR, Estimated 50 (*)    All other components within normal limits  CBG MONITORING, ED - Abnormal; Notable for the following components:   Glucose-Capillary 117 (*)    All other components within normal limits  I-STAT CHEM 8, ED - Abnormal; Notable for the following components:   Glucose, Bld 123 (*)    All other components within normal limits  PROTIME-INR  CBC  DIFFERENTIAL  I-STAT CHEM 8, ED    EKG None  Radiology CT HEAD CODE STROKE WO CONTRAST  Result Date: 08/21/2020 CLINICAL DATA:  Code stroke. Dizziness. Deviating to the left side with walking. Last seen normal 0930 hours. EXAM: CT HEAD WITHOUT CONTRAST TECHNIQUE: Contiguous axial images were obtained from the base of the skull through the vertex without intravenous contrast. COMPARISON:  Brain MRI 08/09/2020.  Head CT 03/17/2020. FINDINGS: Brain: Age related atrophy. Chronic small-vessel ischemic changes of the pons. No focal cerebellar finding. Confluent chronic small vessel ischemic changes throughout the hemispheric white matter. No cortical or large vessel territory  infarction. No mass lesion, hemorrhage, hydrocephalus or extra-axial collection. Vascular: No abnormal vascular finding. Skull: Negative Sinuses/Orbits: Clear/normal Other: None ASPECTS ([place] Stroke Program Early CT Score) - Ganglionic level infarction (caudate, lentiform nuclei, internal capsule, insula, M1-M3 cortex): 7 - Supraganglionic infarction (M4-M6 cortex): 3 Total score (0-10 with 10 being normal): 10 IMPRESSION: 1. Atrophy with chronic small-vessel ischemic changes, extensive throughout the cerebral hemispheric white matter. No acute finding by CT. 2. ASPECTS is 10 3. These results were communicated to Dr. Erlinda Hong at 1:41 pm on 08/21/2020 by text page via the [facility] messaging system. Electronically Signed   By: Nelson Chimes M.D.   On: 08/21/2020 13:42    Procedures Procedures   Medications Ordered in ED Medications  sodium chloride flush (NS) 0.9 % injection 3 mL (3 mLs Intravenous Not Given 08/21/20 1409)  meclizine (ANTIVERT) tablet 25 mg (25 mg Oral Given 08/21/20 1439)    ED Course  I have reviewed the triage vital signs and the nursing notes.  Pertinent labs & imaging results that were available during my care of the patient were reviewed by me and considered in my medical decision making (see chart for details).    MDM Rules/Calculators/A&P  80 year old female arrives from urgent care as code stroke, last known well time of 9:30 AM when patient had onset of dizziness.  Upon EMS arrival to urgent care patient reported that symptoms had resolved.  On arrival to the ED she no longer reports dizziness and has no focal neurologic deficits.  Evaluated with neurology at bedside upon arrival and taken directly to CT.  Noncon head CT with atrophy and chronic small vessel ischemic changes, extensive throughout the cerebral hemisphere, but no acute finding by CT.   Per Dr. Erlinda Hong with neurology, suspect this may be BPPV, patient recently ran out of her meclizine, patient did  have some reproducible dizziness on the left with Dix-Hallpike maneuver.  Plan for MRI with and without contrast given patient's remote history of breast cancer to rule out potential intracranial metastasis leading to dizziness.  Also recommends giving meclizine here.  If MRI is negative patient can be discharged home with continued outpatient follow-up.  I have personally reviewed and interpreted patient's lab work, glucose of 126, creatinine at baseline, no other significant lab derangements.  AT shift change care signed out to Dr. Julieanne Manson who will follow up on MRI results and reevaluate patient.  Anticipate discharge as long as MRI is negative, if there are acute findings on MRI neurology can be reconsulted.  Final Clinical Impression(s) / ED Diagnoses Final diagnoses:  Dizziness    Rx / DC Orders ED Discharge Orders     None        Jacqlyn Larsen, PA-C 08/21/20 1554    Horton, Alvin Critchley, DO 08/24/20 1342

## 2020-08-21 NOTE — ED Provider Notes (Signed)
[facility]    CSN: [id number] Arrival date & time: 08/21/20  1231      History   Chief Complaint Chief Complaint  Patient presents with   Dizziness    HPI [name] [name] is a 80 y.o. female.   Pt is here for dizziness, leaning to the left side side 0930 this am. Pt states that she was in the hospital last week for same symptoms and was told that she had a stroke. Pt does have a hx of vertigo but states she has never had the symptoms of leaning to one side of her body. Started to take plavix on 7/6. Pt is alert to person and place. Does have a delay with dates of being in the hospital. Husband at bedside  states that he was up and took a shower at 10 when wife dropped coffee cup. Decreased grip strength to RT side.    Past Medical History:  Diagnosis Date   Allergic rhinitis    Anxiety    Breast cancer ([place]) hx 2004   recurrent 2006 DR Magrinat   Family history of breast cancer    Genetic testing 12/05/2016   Multi-Cancer panel (83 genes) @ [facility] - Monoallelic mutation in [place] (carrier)   HTN (hypertension)    Hyperlipidemia    Hypothyroidism    Personal history of chemotherapy 2002   Personal history of radiation therapy 2002   Psoriasis    S/P thyroidectomy 07/2005   2 cm largest diameter (1 other tiny focus)/ i-131 rx 99 mci 08/2005   Thyroid cancer ([place])    Papillary Stage 1 - Dr Loanne Drilling   Vitamin B12 deficiency 2009   Vitamin D deficiency 2009    Patient Active Problem List   Diagnosis Date Noted   Retinal artery occlusion, branch, right 08/09/2020   TIA (transient ischemic attack) 08/09/2020   Hyperparathyroidism ([place]) 06/13/2020   Skin lesion 03/21/2020   Cerebrovascular disease 03/21/2020   Pruritus 03/14/2020   Ataxia 07/20/2019   Seroma of breast 07/20/2019   Cerumen impaction 03/23/2019   Wrist pain 03/23/2019   Right ear pain 06/27/2018   Coronary artery disease 01/20/2018   Breast cyst, left 09/17/2017   Aortic atherosclerosis  ([place]) 07/28/2017   Osteoporosis 05/07/2017   Genetic testing 12/05/2016   Family history of breast cancer    Port catheter in place 10/23/2016   Encounter for antineoplastic chemotherapy 10/23/2016   Malignant neoplasm of upper-outer quadrant of left breast in female, estrogen receptor positive ([place]) 07/29/2016   Well adult exam 06/05/2015   Neoplasm of uncertain behavior of skin 06/05/2015   Adenopathy, cervical 06/05/2015   Tachycardia 06/07/2014   Wart viral 10/18/2011   Hyperglycemia 11/06/2010   Actinic keratoses 05/25/2010   Essential hypertension 02/23/2010   SHOULDER PAIN 11/22/2009   Chronic fatigue 11/08/2009   RLQ PAIN 11/08/2009   VERTIGO 07/11/2009   ECZEMA 10/19/2008   CT, CHEST, ABNORMAL 05/30/2008   STYE 02/17/2008   B12 deficiency 12/16/2007   Vitamin D deficiency 12/16/2007   HYPOTHYROIDISM, POSTSURGICAL 12/01/2007   Acute maxillary sinusitis 11/11/2007   ALLERGIC RESPIRATORY DISEASE, EXTRINSIC 11/11/2007   THYROID CANCER 08/19/2007   Dyslipidemia 08/19/2007   Anxiety disorder 08/19/2007   Situational depression 08/19/2007   Allergic rhinitis 08/19/2007    Past Surgical History:  Procedure Laterality Date   BREAST LUMPECTOMY     BREAST LUMPECTOMY WITH RADIOACTIVE SEED AND SENTINEL LYMPH NODE BIOPSY Left 09/11/2016   Procedure: LEFT BREAST LUMPECTOMY WITH RADIOACTIVE SEED AND  LEFT AXILLARY SENTINEL LYMPH NODE BIOPSY WITH BLUE DYE INJECTION;  Surgeon: [name], MD;  Location: [facility];  Service: General;  Laterality: Left;   IR FLUORO GUIDE PORT INSERTION RIGHT  09/13/2016   IR REMOVAL TUN ACCESS W/ PORT W/O FL MOD SED  12/30/2016   IR US GUIDE VASC ACCESS RIGHT  09/13/2016   MASTECTOMY Right    Right   PORTACATH PLACEMENT N/A 09/11/2016   Procedure: ATTEMPTED INSERTION PORT-A-CATH WITH ULTRA SOUND GUIDANCE;  Surgeon: [name], MD;  Location: [facility];  Service: General;  Laterality: N/A;   REDUCTION  MAMMAPLASTY Left 2005   THYROIDECTOMY  2007    OB History   No obstetric history on file.      Home Medications    Prior to Admission medications   Medication Sig Start Date End Date Taking? Authorizing Provider  ALPRAZolam (XANAX) 1 MG tablet TAKE 0.5-1 TABLETS (0.5-1 MG TOTAL) BY MOUTH 2 (TWO) TIMES DAILY AS NEEDED FOR ANXIETY. 03/15/20   Plotnikov, Evie Lacks, MD  anastrozole (ARIMIDEX) 1 MG tablet TAKE 1 TABLET BY MOUTH EVERY DAY 08/31/19   Magrinat, [name], MD  aspirin EC 81 MG EC tablet Take 1 tablet (81 mg total) by mouth daily. Swallow whole. 08/11/20   Hongalgi, Lenis Dickinson, MD  cholecalciferol (VITAMIN D) 1000 units tablet Take 2 tablets (2,000 Units total) by mouth daily. 06/19/17   Plotnikov, Evie Lacks, MD  clopidogrel (PLAVIX) 75 MG tablet Take 1 tablet (75 mg total) by mouth daily for 21 days. 08/11/20 09/01/20  Modena Jansky, MD  levothyroxine (SYNTHROID) 112 MCG tablet TAKE 1 TABLET (112 MCG TOTAL) BY MOUTH DAILY. 12/27/19   Plotnikov, Evie Lacks, MD  rosuvastatin (CRESTOR) 20 MG tablet Take 1 tablet (20 mg total) by mouth daily. 08/10/20   Hongalgi, Lenis Dickinson, MD  telmisartan (MICARDIS) 40 MG tablet TAKE 1 TABLET BY MOUTH EVERY DAY 12/24/19   Plotnikov, Evie Lacks, MD  venlafaxine XR (EFFEXOR-XR) 75 MG 24 hr capsule TAKE 1 CAPSULE (75 MG TOTAL) BY MOUTH DAILY WITH BREAKFAST. 08/10/20   Plotnikov, Evie Lacks, MD  vitamin B-12 (CYANOCOBALAMIN) 1000 MCG tablet Take 1,000 mcg by mouth daily.    [provider]    Family History Family History  Problem Relation Age of Onset   Stroke Mother    Allergies Mother    Asthma Mother    Clotting disorder Mother    Heart disease Father 12       MI   Allergies Sister    Asthma Sister    Breast cancer Sister 53       Lymphoma 25; currently 53   Lymphoma Sister    Hyperparathyroidism Sister    Asthma Brother    Leukemia Brother        dx 45s   Allergies Daughter    Asthma Daughter    Allergies Sister    Breast cancer Sister 72        currently 21   Kidney cancer Paternal Aunt        kidney ca; deceased 42   Liver cancer Paternal Uncle        unk. primary ("liver")   Throat cancer Maternal Grandmother        deceased 52   Lung cancer Maternal Grandfather        deceased 57   Pancreatic cancer Paternal Grandfather        deceased 36   Cancer Paternal Aunt        "  abdominal"; deceased 45    Social History Social History   Tobacco Use   Smoking status: Never   Smokeless tobacco: Never  Substance Use Topics   Alcohol use: No   Drug use: No     Allergies   Pneumococcal vaccines, Sulfa antibiotics, Sulfacetamide sodium-sulfur, Tape, and Tegaderm ag mesh [silver]   Review of Systems Review of Systems  Constitutional:  Positive for activity change. Negative for fever.  HENT:  Positive for hearing loss. Negative for drooling, rhinorrhea, sinus pressure, sinus pain and sore throat.        No facial droop ,   Respiratory: Negative.    Cardiovascular: Negative.   Gastrointestinal: Negative.   Musculoskeletal:        Lt upper shoulder back pain,   Skin: Negative.   Neurological:  Positive for dizziness, weakness and numbness. Negative for facial asymmetry.       Body leaning to lt side when walking     Physical Exam Triage Vital Signs ED Triage Vitals  Enc Vitals Group     BP 08/21/20 1234 (!) 127/93     Pulse Rate 08/21/20 1234 98     Resp 08/21/20 1234 18     Temp --      Temp src --      SpO2 08/21/20 1234 97 %     Weight --      Height --      Head Circumference --      Peak Flow --      Pain Score 08/21/20 1235 0     Pain Loc --      Pain Edu? --      Excl. in [place]? --    No data found.  Updated Vital Signs BP (!) 127/93   Pulse 98   Resp 18   SpO2 97%   Visual Acuity Right Eye Distance:   Left Eye Distance:   Bilateral Distance:    Right Eye Near:   Left Eye Near:    Bilateral Near:     Physical Exam Constitutional:      Comments: Alert to person and place. Pt has a  delay on telling events of the day.   Eyes:     Comments: Blurred vision to bil eyes pt thinks due to cataracts,   Cardiovascular:     Rate and Rhythm: Normal rate.  Abdominal:     General: Abdomen is flat.  Musculoskeletal:     Comments: Lt upper lt shoulder pain, leaning to lt side when walking in room. Needs assistance while walking normal pt does not need assistance with talking on a daily basis. Pt denies any post deficit from prior stroke. Decrease grip strength 3/4 rt   Skin:    Capillary Refill: Capillary refill takes less than 2 seconds.     Comments: Small areas to bil arms of bruising dime size ,   Neurological:     Mental Status: She is oriented to person, place, and time.     Motor: Weakness present.     Coordination: Coordination abnormal.     Gait: Gait abnormal.     UC Treatments / Results  Labs (all labs ordered are listed, but only abnormal results are displayed) Labs Reviewed - No data to display  EKG   Radiology No results found.  Procedures Procedures (including critical care time)  Medications Ordered in UC Medications - No data to display  Initial Impression / Assessment and Plan / UC Course  I have reviewed the triage vital signs and the nursing notes.  Pertinent labs & imaging results that were available during my care of the patient were reviewed by me and considered in my medical decision making (see chart for details).     Last known normal was at 0930 this am called ems and possible stroke  Pt went last week to hospital with same compliant last week and was dx with stroke  Reviewed previous chart and will not risk possible TIA and send to er  Discussed with pt and her husband tx plan    Final Clinical Impressions(s) / UC Diagnoses   Final diagnoses:  Dizziness  Vertigo  Weakness of left side of body     Discharge Instructions      GO to ER to r/o a TIA      ED Prescriptions   None    PDMP not reviewed this  encounter.   Marney Setting, NP 08/21/20 1339

## 2020-08-21 NOTE — ED Notes (Signed)
Patient is being discharged from the Urgent Care and sent to the Emergency Department via EMT . Per Morley Kos  NP, patient is in need of higher level of care due to dizziness and left side deviation. Patient is aware and verbalizes understanding of plan of care.  Vitals:   08/21/20 1234  BP: (!) 127/93  Pulse: 98  Resp: 18  SpO2: 97%

## 2020-08-21 NOTE — ED Notes (Signed)
EMS called, en route for transport.

## 2020-08-21 NOTE — Discharge Instructions (Addendum)
You were evaluated in the ER for your dizziness.  Our stroke work-up was negative.  We believe that your symptoms are most likely due to BPPV.  At your PT appointment next week, you should request to go over BPPV maneuvers, including the Epley maneuver.  We will additionally send you home with some meclizine which can help with your dizziness.  You should also follow-up with your PCP in the next couple days for reevaluation further management.  Please return to the ER for any worsening symptoms, including severe headache, numbness tingling or weakness in 1 arm or 1 leg, new speech deficits.

## 2020-08-21 NOTE — Consult Note (Signed)
Stroke Neurology Consultation Note  Consult Requested by: Dr Dina Rich  Reason for Consult: dizziness  Consult Date: 08/21/20   The history was obtained from the pt.  During history and examination, all items were able to obtain unless otherwise noted.  History of Present Illness:  [name] [name] is a 80 y.o. Caucasian female with PMH of hypertension, hyperlipidemia, B12 deficiency, breast cancer, episodic vertigo on meclizine, recent right BRAO presented to ED for dizziness, wobbly gait. She stated that this morning she woke up at 9:30am and got up to go to bathroom. While still sitting at the edge of bed, she had dizziness with more room spinning. However, she had to pee so she walked to bathroom and she felt wobbly gait, had to hold onto things. She did not fall. She then came back lying in bed, still had dizziness when moving head from side to side, but if not moving head, dizziness was better. They went to UC at 11:30am and code stroke was called from there. While on ER arrival, pt said her symptoms all resolved.  She stated that for the last 5 to 6 years, she had episodic vertigo, mostly at getting up from bed in the morning, lasting about 20 to 30 minutes with meclizine.  However, her meclizine has run out and today she has no meclizine to take.  Episode happens average once every 2 months.  Today episode similar to previous ones but more severe and lasting longer.  Patient attributes this to not having meclizine today.  She also has sometimes left ear ringing while laying down in bed, not at same time with vertigo.  She had aggressive cancer 3 to 4 years ago, following with Dr. Jana Hakim.  She had a recent right eye BRAO, admitted 2 weeks ago, all work-up negative, pending follow-up with Dr. Carolynn Sayers as outpatient.  LSN: 9:30 AM tPA Given: No: Symptom resolved, likely not stroke  Past Medical History:  Diagnosis Date   Allergic rhinitis    Anxiety    Breast cancer ([place]) hx 2004   recurrent  2006 DR Magrinat   Family history of breast cancer    Genetic testing 12/05/2016   Multi-Cancer panel (83 genes) @ [facility] - Monoallelic mutation in [place] (carrier)   HTN (hypertension)    Hyperlipidemia    Hypothyroidism    Personal history of chemotherapy 2002   Personal history of radiation therapy 2002   Psoriasis    S/P thyroidectomy 07/2005   2 cm largest diameter (1 other tiny focus)/ i-131 rx 99 [facility] 08/2005   Thyroid cancer ([place])    Papillary Stage 1 - Dr Loanne Drilling   Vitamin B12 deficiency 2009   Vitamin D deficiency 2009    Past Surgical History:  Procedure Laterality Date   BREAST LUMPECTOMY     BREAST LUMPECTOMY WITH RADIOACTIVE SEED AND SENTINEL LYMPH NODE BIOPSY Left 09/11/2016   Procedure: LEFT BREAST LUMPECTOMY WITH RADIOACTIVE SEED AND LEFT AXILLARY SENTINEL LYMPH NODE BIOPSY WITH BLUE DYE INJECTION;  Surgeon: [name], MD;  Location: [place];  Service: General;  Laterality: Left;   IR FLUORO GUIDE PORT INSERTION RIGHT  09/13/2016   IR REMOVAL TUN ACCESS W/ PORT W/O FL MOD SED  12/30/2016   IR US GUIDE VASC ACCESS RIGHT  09/13/2016   MASTECTOMY Right    Right   PORTACATH PLACEMENT N/A 09/11/2016   Procedure: ATTEMPTED INSERTION PORT-A-CATH WITH ULTRA SOUND GUIDANCE;  Surgeon: [name], MD;  Location: [place];  Service: General;  Laterality: N/A;   REDUCTION MAMMAPLASTY Left 2005   THYROIDECTOMY  2007    Family History  Problem Relation Age of Onset   Stroke Mother    Allergies Mother    Asthma Mother    Clotting disorder Mother    Heart disease Father 73       MI   Allergies Sister    Asthma Sister    Breast cancer Sister 14       Lymphoma 5; currently 78   Lymphoma Sister    Hyperparathyroidism Sister    Asthma Brother    Leukemia Brother        dx 54s   Allergies Daughter    Asthma Daughter    Allergies Sister    Breast cancer Sister 9       currently 28   Kidney cancer Paternal Aunt         kidney ca; deceased 13   Liver cancer Paternal Uncle        unk. primary ("liver")   Throat cancer Maternal Grandmother        deceased 32   Lung cancer Maternal Grandfather        deceased 21   Pancreatic cancer Paternal Grandfather        deceased 60   Cancer Paternal Aunt        "abdominal"; deceased 74    Social History:  reports that she has never smoked. She has never used smokeless tobacco. She reports that she does not drink alcohol and does not use drugs.  Allergies:  Allergies  Allergen Reactions   Pneumococcal Vaccines Other (See Comments)    Was really sick    Sulfa Antibiotics Other (See Comments)    Pt believes it was hives   Sulfacetamide Sodium-Sulfur    Tape Itching, Dermatitis and Rash   Tegaderm Ag Mesh [Silver] Itching and Rash    No current facility-administered medications on file prior to encounter.   Current Outpatient Medications on File Prior to Encounter  Medication Sig Dispense Refill   ALPRAZolam (XANAX) 1 MG tablet TAKE 0.5-1 TABLETS (0.5-1 MG TOTAL) BY MOUTH 2 (TWO) TIMES DAILY AS NEEDED FOR ANXIETY. 60 tablet 1   anastrozole (ARIMIDEX) 1 MG tablet TAKE 1 TABLET BY MOUTH EVERY DAY 90 tablet 4   aspirin EC 81 MG EC tablet Take 1 tablet (81 mg total) by mouth daily. Swallow whole. 30 tablet 1   cholecalciferol (VITAMIN D) 1000 units tablet Take 2 tablets (2,000 Units total) by mouth daily. 100 tablet 3   clopidogrel (PLAVIX) 75 MG tablet Take 1 tablet (75 mg total) by mouth daily for 21 days. 21 tablet 0   levothyroxine (SYNTHROID) 112 MCG tablet TAKE 1 TABLET (112 MCG TOTAL) BY MOUTH DAILY. 90 tablet 2   rosuvastatin (CRESTOR) 20 MG tablet Take 1 tablet (20 mg total) by mouth daily. 30 tablet 1   telmisartan (MICARDIS) 40 MG tablet TAKE 1 TABLET BY MOUTH EVERY DAY 90 tablet 0   venlafaxine XR (EFFEXOR-XR) 75 MG 24 hr capsule TAKE 1 CAPSULE (75 MG TOTAL) BY MOUTH DAILY WITH BREAKFAST. 90 capsule 1   vitamin B-12 (CYANOCOBALAMIN) 1000 MCG tablet  Take 1,000 mcg by mouth daily.      Review of Systems: A full ROS was attempted today and was able to be performed.  Systems assessed include - Constitutional, Eyes, HENT, Respiratory, Cardiovascular, Gastrointestinal, Genitourinary, Integument/breast, Hematologic/lymphatic, Musculoskeletal, Neurological, Behavioral/Psych, Endocrine, Allergic/Immunologic - with pertinent responses as per HPI.  Physical Examination: Pulse Rate:  [82-98] 82 (07/11 1340) Resp:  [17-18] 17 (07/11 1340) BP: (127-145)/(86-93) 145/86 (07/11 1340) SpO2:  [97 %-100 %] 100 % (07/11 1340) Weight:  [69 kg] 69 kg (07/11 1300)  General - well nourished, well developed, in no apparent distress.    Ophthalmologic - fundi not visualized due to noncooperation.    Cardiovascular - regular rhythm and rate  Mental Status -  Level of arousal and orientation to time, place, and person were intact. Language including expression, naming, repetition, comprehension, reading, and writing was assessed and found intact. Fund of Knowledge was assessed and was intact.  Cranial Nerves II - XII - II - Vision intact OS.  Small central vision loss OD, unchanged from 2 weeks ago. III, IV, VI - Extraocular movements intact. V - Facial sensation intact bilaterally. VII - Facial movement intact bilaterally. VIII - Hearing & vestibular intact bilaterally. X - Palate elevates symmetrically. XI - Chin turning & shoulder shrug intact bilaterally. XII - Tongue protrusion intact.  Motor Strength - The patient's strength was normal in all extremities and pronator drift was absent.   Motor Tone & Bulk - Muscle tone was assessed at the neck and appendages and was normal.  Bulk was normal and fasciculations were absent.   Reflexes - The patient's reflexes were normal in all extremities and she had no pathological reflexes.  Sensory - Light touch, temperature/pinprick were assessed and were normal.    Coordination - The patient had normal  movements in the hands and feet with no ataxia or dysmetria.  Tremor was absent.   Gait and Station - deferred but Dix-Hallpike maneuver only mild brief dizziness on getting up from neck turning left position, no nystagmus.  NIHSS = 0.  Data Reviewed: CT Angio Head W or Wo Contrast  Result Date: 08/09/2020 CLINICAL DATA:  Branch retinal artery occlusion. EXAM: CT ANGIOGRAPHY HEAD AND NECK TECHNIQUE: Multidetector CT imaging of the head and neck was performed using the standard protocol during bolus administration of intravenous contrast. Multiplanar CT image reconstructions and MIPs were obtained to evaluate the vascular anatomy. Carotid stenosis measurements (when applicable) are obtained utilizing NASCET criteria, using the distal internal carotid diameter as the denominator. CONTRAST:  8mL OMNIPAQUE IOHEXOL 350 MG/ML SOLN COMPARISON:  Head CT 03/17/2020 FINDINGS: CT HEAD FINDINGS Brain: There is no evidence of an acute infarct, intracranial hemorrhage, mass, midline shift, or extra-axial fluid collection. Confluent hypodensities in the cerebral white matter are similar to the prior CT and are nonspecific but compatible with severe chronic small vessel ischemic disease. There is mild cerebral atrophy. Vascular: Reported below. Skull: No fracture or suspicious osseous lesion. Sinuses: Visualized paranasal sinuses and mastoid air cells are clear. Orbits: Unremarkable. Review of the MIP images confirms the above findings CTA NECK FINDINGS Aortic arch: Standard 3 vessel aortic arch with mild atherosclerotic plaque. No significant arch vessel origin stenosis. Right carotid system: Patent with a small amount of calcified plaque at the carotid bifurcation. No evidence of dissection or significant stenosis. Mild beading of the mid cervical ICA. Left carotid system: Patent without evidence of a dissection or significant stenosis. Retropharyngeal course of the distal common and proximal internal carotid arteries.  Mild beading of the mid cervical ICA. 2 mm laterally directed outpouching from the ICA at the distal aspect of the beaded region consistent with a pseudoaneurysm (series 12, image 114). Vertebral arteries: Patent and codominant without evidence of a significant stenosis or dissection. Mild beading of the left V3  segment. Skeleton: Moderate cervical disc degeneration. Other neck: Status post thyroidectomy. Asymmetric fatty atrophy of the left parotid gland and right submandibular gland. Upper chest: No apical lung consolidation or mass. Review of the MIP images confirms the above findings CTA HEAD FINDINGS Anterior circulation: The internal carotid arteries are widely patent from skull base to carotid termini. There is developmental asymmetry with the right ICA being larger than the left due to absence of the left A1 segment, and there is also superimposed ectasia of the right cavernous ICA. ACAs and MCAs are patent without evidence of a proximal branch occlusion or significant proximal stenosis. No aneurysm is identified. Posterior circulation: The intracranial vertebral arteries are widely patent to the basilar. Patent left PICA, right AICA, and bilateral SCA origins are identified. The basilar artery is widely patent. Posterior communicating arteries are diminutive or absent. The PCAs are patent with mild asymmetric attenuation of distal right PCA branch vessels but no significant proximal stenosis. No aneurysm is identified. Venous sinuses: Patent. Anatomic variants: Left A1 aplasia. Review of the MIP images confirms the above findings IMPRESSION: 1. No evidence of acute intracranial abnormality. Severe chronic small vessel ischemia in the cerebral white matter. 2. No large vessel occlusion or significant proximal stenosis in the head or neck. 3. Beading of the cervical internal carotid arteries and left vertebral artery V3 segment consistent with fibromuscular dysplasia. 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 4. No major intracranial arterial occlusion or significant proximal stenosis. 5. Aortic Atherosclerosis (ICD10-I70.0). Electronically Signed   By: Logan Bores M.D.   On: 08/09/2020 18:05   DG Chest 2 View  Result Date: 08/09/2020 CLINICAL DATA:  Weak and dizzy EXAM: CHEST - 2 VIEW COMPARISON:  07/09/2017 FINDINGS: Heart size and vascularity normal. Lungs clear without infiltrate effusion or mass. Mild apical scarring bilaterally. Right mastectomy.  Surgical clips left breast. Moderate thoracic scoliosis. IMPRESSION: No active cardiopulmonary disease. Electronically Signed   By: Franchot Gallo M.D.   On: 08/09/2020 13:33   CT Angio Neck W and/or Wo Contrast  Result Date: 08/09/2020 CLINICAL DATA:  Branch retinal artery occlusion. EXAM: CT ANGIOGRAPHY HEAD AND NECK TECHNIQUE: Multidetector CT imaging of the head and neck was performed using the standard protocol during bolus administration of intravenous contrast. Multiplanar CT image reconstructions and MIPs were obtained to evaluate the vascular anatomy. Carotid stenosis measurements (when applicable) are obtained utilizing NASCET criteria, using the distal internal carotid diameter as the denominator. CONTRAST:  49mL OMNIPAQUE IOHEXOL 350 MG/ML SOLN COMPARISON:  Head CT 03/17/2020 FINDINGS: CT HEAD FINDINGS Brain: There is no evidence of an acute infarct, intracranial hemorrhage, mass, midline shift, or extra-axial fluid collection. Confluent hypodensities in the cerebral white matter are similar to the prior CT and are nonspecific but compatible with severe chronic small vessel ischemic disease. There is mild cerebral atrophy. Vascular: Reported below. Skull: No fracture or suspicious osseous lesion. Sinuses: Visualized paranasal sinuses and mastoid air cells are clear. Orbits: Unremarkable. Review of the MIP images confirms the above findings CTA NECK FINDINGS Aortic arch: Standard 3 vessel aortic arch with mild atherosclerotic plaque.  No significant arch vessel origin stenosis. Right carotid system: Patent with a small amount of calcified plaque at the carotid bifurcation. No evidence of dissection or significant stenosis. Mild beading of the mid cervical ICA. Left carotid system: Patent without evidence of a dissection or significant stenosis. Retropharyngeal course of the distal common and proximal internal carotid arteries. Mild beading of the mid cervical ICA. 2  mm laterally directed outpouching from the ICA at the distal aspect of the beaded region consistent with a pseudoaneurysm (series 12, image 114). Vertebral arteries: Patent and codominant without evidence of a significant stenosis or dissection. Mild beading of the left V3 segment. Skeleton: Moderate cervical disc degeneration. Other neck: Status post thyroidectomy. Asymmetric fatty atrophy of the left parotid gland and right submandibular gland. Upper chest: No apical lung consolidation or mass. Review of the MIP images confirms the above findings CTA HEAD FINDINGS Anterior circulation: The internal carotid arteries are widely patent from skull base to carotid termini. There is developmental asymmetry with the right ICA being larger than the left due to absence of the left A1 segment, and there is also superimposed ectasia of the right cavernous ICA. ACAs and MCAs are patent without evidence of a proximal branch occlusion or significant proximal stenosis. No aneurysm is identified. Posterior circulation: The intracranial vertebral arteries are widely patent to the basilar. Patent left PICA, right AICA, and bilateral SCA origins are identified. The basilar artery is widely patent. Posterior communicating arteries are diminutive or absent. The PCAs are patent with mild asymmetric attenuation of distal right PCA branch vessels but no significant proximal stenosis. No aneurysm is identified. Venous sinuses: Patent. Anatomic variants: Left A1 aplasia. Review of the MIP images confirms the  above findings IMPRESSION: 1. No evidence of acute intracranial abnormality. Severe chronic small vessel ischemia in the cerebral white matter. 2. No large vessel occlusion or significant proximal stenosis in the head or neck. 3. Beading of the cervical internal carotid arteries and left vertebral artery V3 segment consistent with fibromuscular dysplasia. 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 4. No major intracranial arterial occlusion or significant proximal stenosis. 5. Aortic Atherosclerosis (ICD10-I70.0). Electronically Signed   By: Logan Bores M.D.   On: 08/09/2020 18:05   MR BRAIN WO CONTRAST  Result Date: 08/09/2020 CLINICAL DATA:  Vision loss.  Right branch retinal artery occlusion. EXAM: MRI HEAD WITHOUT CONTRAST TECHNIQUE: Multiplanar, multiecho pulse sequences of the brain and surrounding structures were obtained without intravenous contrast. COMPARISON:  Head and neck CTA 08/09/2020 FINDINGS: Brain: There is no evidence of an acute infarct, intracranial hemorrhage, mass, midline shift, or extra-axial fluid collection. Confluent T2 hyperintensities in the cerebral white matter and pons are nonspecific but compatible with severe chronic small vessel ischemic disease. Heterogeneous T2 hyperintensity is also present in the deep gray nuclei. There is mild cerebral atrophy. Vascular: Major intracranial vascular flow voids are preserved. Skull and upper cervical spine: Unremarkable bone marrow signal. Sinuses/Orbits: Unremarkable orbits. No significant inflammatory changes in the paranasal sinuses or mastoid air cells. Other: None. IMPRESSION: 1. No acute intracranial abnormality. 2. Severe chronic small vessel ischemic disease. Electronically Signed   By: Logan Bores M.D.   On: 08/09/2020 20:33   MM 3D SCREEN BREAST UNI LEFT  Result Date: 08/09/2020 CLINICAL DATA:  Screening. History of left lumpectomy 2018 and remote right mastectomy. EXAM: DIGITAL SCREENING UNILATERAL LEFT  MAMMOGRAM WITH CAD AND TOMOSYNTHESIS TECHNIQUE: Left screening digital craniocaudal and mediolateral oblique mammograms were obtained. Left screening digital breast tomosynthesis was performed. The images were evaluated with computer-aided detection. COMPARISON:  Previous exam(s). ACR Breast Density Category b: There are scattered areas of fibroglandular density. FINDINGS: There are no findings suspicious for malignancy. IMPRESSION: No mammographic evidence of malignancy. A result letter of this screening mammogram will be mailed directly to the patient. RECOMMENDATION: Screening mammogram in one year. (Code:[id number]-B-01Y) BI-RADS CATEGORY  1: Negative. Electronically  Signed   By: Everlean Alstrom M.D.   On: 08/09/2020 15:21  ECHOCARDIOGRAM COMPLETE  Result Date: 08/10/2020    ECHOCARDIOGRAM REPORT   Patient Name:   CALANDRIA MULLINGS Date of Exam: 08/10/2020 Medical Rec #:  [id number]       Height:       64.0 in Accession #:    [id number]      Weight:       318.3 lb Date of Birth:  September 29, 1940       BSA:          2.384 m Patient Age:    30 years        BP:           150/88 mmHg Patient Gender: F               HR:           102 bpm. Exam Location:  Inpatient Procedure: 2D Echo, Cardiac Doppler and Color Doppler Indications:    Stroke  History:        Patient has no prior history of Echocardiogram examinations.                 Risk Factors:Hypertension.  Sonographer:    Cammy Brochure Referring Phys: [phone] ANAND D HONGALGI IMPRESSIONS  1. Left ventricular ejection fraction, by estimation, is 60 to 65%. The left ventricle has normal function. The left ventricle has no regional wall motion abnormalities. Left ventricular diastolic parameters are consistent with Grade I diastolic dysfunction (impaired relaxation).  2. Right ventricular systolic function is normal. The right ventricular size is normal. Tricuspid regurgitation signal is inadequate for assessing PA pressure.  3. The mitral valve is grossly normal. No evidence  of mitral valve regurgitation. No evidence of mitral stenosis.  4. The aortic valve is tricuspid. Aortic valve regurgitation is not visualized. No aortic stenosis is present.  5. The inferior vena cava is normal in size with greater than 50% respiratory variability, suggesting right atrial pressure of 3 mmHg. Conclusion(s)/Recommendation(s): No intracardiac source of embolism detected on this transthoracic study. A transesophageal echocardiogram is recommended to exclude cardiac source of embolism if clinically indicated. FINDINGS  Left Ventricle: Left ventricular ejection fraction, by estimation, is 60 to 65%. The left ventricle has normal function. The left ventricle has no regional wall motion abnormalities. Definity contrast agent was given IV to delineate the left ventricular  endocardial borders. The left ventricular internal cavity size was normal in size. There is no left ventricular hypertrophy. Left ventricular diastolic parameters are consistent with Grade I diastolic dysfunction (impaired relaxation). Right Ventricle: The right ventricular size is normal. No increase in right ventricular wall thickness. Right ventricular systolic function is normal. Tricuspid regurgitation signal is inadequate for assessing PA pressure. Left Atrium: Left atrial size was normal in size. Right Atrium: Right atrial size was normal in size. Pericardium: Trivial pericardial effusion is present. Presence of pericardial fat pad. Mitral Valve: The mitral valve is grossly normal. No evidence of mitral valve regurgitation. No evidence of mitral valve stenosis. Tricuspid Valve: The tricuspid valve is grossly normal. Tricuspid valve regurgitation is not demonstrated. No evidence of tricuspid stenosis. Aortic Valve: The aortic valve is tricuspid. Aortic valve regurgitation is not visualized. No aortic stenosis is present. Aortic valve mean gradient measures 4.0 mmHg. Aortic valve peak gradient measures 7.8 mmHg. Aortic valve area, by  VTI measures 3.27 cm. Pulmonic Valve: The pulmonic valve was grossly normal. Pulmonic valve regurgitation is not visualized. No  evidence of pulmonic stenosis. Aorta: The aortic root and ascending aorta are structurally normal, with no evidence of dilitation. Venous: The inferior vena cava is normal in size with greater than 50% respiratory variability, suggesting right atrial pressure of 3 mmHg. IAS/Shunts: The atrial septum is grossly normal.  LEFT VENTRICLE PLAX 2D LVIDd:         3.50 cm  Diastology LVIDs:         2.30 cm  LV e' medial:    4.57 cm/s LV PW:         1.00 cm  LV E/e' medial:  14.7 LV IVS:        1.00 cm  LV e' lateral:   5.44 cm/s LVOT diam:     2.10 cm  LV E/e' lateral: 12.3 LV SV:         83 LV SV Index:   35 LVOT Area:     3.46 cm  RIGHT VENTRICLE RV S prime:     9.79 cm/s LEFT ATRIUM             Index       RIGHT ATRIUM          Index LA diam:        2.60 cm 1.09 cm/m  RA Area:     8.75 cm LA Vol (A2C):   41.5 ml 17.41 ml/m RA Volume:   16.60 ml 6.96 ml/m LA Vol (A4C):   42.1 ml 17.66 ml/m LA Biplane Vol: 43.4 ml 18.21 ml/m  AORTIC VALVE AV Area (Vmax):    3.22 cm AV Area (Vmean):   3.19 cm AV Area (VTI):     3.27 cm AV Vmax:           140.00 cm/s AV Vmean:          97.700 cm/s AV VTI:            0.253 m AV Peak Grad:      7.8 mmHg AV Mean Grad:      4.0 mmHg LVOT Vmax:         130.00 cm/s LVOT Vmean:        90.000 cm/s LVOT VTI:          0.239 m LVOT/AV VTI ratio: 0.94  AORTA Ao Root diam: 3.10 cm Ao Asc diam:  2.80 cm MITRAL VALVE MV Area (PHT): 4.06 cm     SHUNTS MV Decel Time: 187 msec     Systemic VTI:  0.24 m MV E velocity: 67.00 cm/s   Systemic Diam: 2.10 cm MV A velocity: 112.00 cm/s MV E/A ratio:  0.60 Eleonore Chiquito MD Electronically signed by Eleonore Chiquito MD Signature Date/Time: 08/10/2020/2:44:41 PM    Final    CT HEAD CODE STROKE WO CONTRAST  Result Date: 08/21/2020 CLINICAL DATA:  Code stroke. Dizziness. Deviating to the left side with walking. Last seen normal 0930  hours. EXAM: CT HEAD WITHOUT CONTRAST TECHNIQUE: Contiguous axial images were obtained from the base of the skull through the vertex without intravenous contrast. COMPARISON:  Brain MRI 08/09/2020.  Head CT 03/17/2020. FINDINGS: Brain: Age related atrophy. Chronic small-vessel ischemic changes of the pons. No focal cerebellar finding. Confluent chronic small vessel ischemic changes throughout the hemispheric white matter. No cortical or large vessel territory infarction. No mass lesion, hemorrhage, hydrocephalus or extra-axial collection. Vascular: No abnormal vascular finding. Skull: Negative Sinuses/Orbits: Clear/normal Other: None ASPECTS ([place] Stroke Program Early CT Score) - Ganglionic level infarction (caudate, lentiform nuclei, internal capsule,  insula, M1-M3 cortex): 7 - Supraganglionic infarction (M4-M6 cortex): 3 Total score (0-10 with 10 being normal): 10 IMPRESSION: 1. Atrophy with chronic small-vessel ischemic changes, extensive throughout the cerebral hemispheric white matter. No acute finding by CT. 2. ASPECTS is 10 3. These results were communicated to Dr. Erlinda Hong at 1:41 pm on 08/21/2020 by text page via the [facility] messaging system. Electronically Signed   By: Nelson Chimes M.D.   On: 08/21/2020 13:42    Assessment: 80 y.o. female PMH of hypertension, hyperlipidemia, B12 deficiency, breast cancer, episodic vertigo on meclizine, recent right BRAO presented to ED for dizziness, wobbly gait.  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  Today similar episode as previous BPPV, more severe than usual, patient attributed to meclizine run out.  Time onset 9:30 AM when getting up from bed after woke up.  Symptoms has resolved on arrival to ED.  NIH score 0.  CT no acute abnormality.  Etiology for patient's symptoms more consistent with BPPV, however given her age, risk factors, recent BRAO, and a history of breast cancer, recommend MRI with and without  contrast to rule out stroke or metastasis.  If MRI negative, patient can be discharged from neuro standpoint.  Patient has PT outpatient follow-up next week, recommend BPPV maneuver training with PT.  She has pending follow-up with ophthalmology Dr. Carolynn Sayers for R BRAO.  Recommend meclizine p.o. as outpatient as needed.  Discussed with EDP Dr. Dina Rich.  Plan: - MRI with and without contrast - If MRI negative, patient can be discharged from neuro standpoint.   - Patient has PT outpatient follow-up next week, recommend BPPV maneuver training with PT.   - She has pending follow-up with ophthalmology Dr. Carolynn Sayers for R BRAO.   - Recommend meclizine p.o. as outpatient as needed.   - Discussed with EDP Dr. Dina Rich.  Thank you for this consultation and allowing [place] to participate in the care of this patient.   Rosalin Hawking, MD PhD Stroke Neurology 08/21/2020 2:56 PM

## 2020-08-21 NOTE — Code Documentation (Signed)
Stroke Response Nurse Documentation Code Documentation  [name] [name] is a 80 y.o. female arriving to [facility] ED via [facility] on 08/21/2020 with past medical hx of Vertigo, HLP, HTN, and Breast Cancer. Code stroke was activated by Urgent Care. Patient from Urgent Care where she was LKW at 0930 and now reporting all symptoms resolved. This morning, the patient got up and was sitting on the bed and had a sudden onset of dizziness. The Patient reports feeling like she was leaning to the left. She walked to the bathroom and returned to her bed without falling, but dizziness continued. Patient went to [facility] and they activated a Code Stroke.   Stroke team at the bedside on patient arrival. Labs drawn and patient cleared for CT by EDP.  Patient to CT with team. NIHSS 0, see documentation for details and code stroke times. Patient with has no deficits on exam and reports dizziness cleared. The following imaging was completed: CT.  Patient is not a candidate for tPA due to symptoms fully resolving.  Care/Plan: q 2 mNIHSS/VS and MRI to rule out new stroke. Bedside handoff with ED RN Raquel Sarna.    Kathrin Greathouse  Stroke Response RN

## 2020-08-21 NOTE — ED Notes (Signed)
Reviewed discharge instructions with patient. Follow-up care and medications reviewed. Patient  verbalized understanding. Patient A&Ox4, VSS, and ambulatory with steady gait upon discharge.  °

## 2020-08-21 NOTE — ED Triage Notes (Signed)
Pt reports having had a "light stroke" last week, and reports that she woke up with dizziness and nausea this morning starting at 0930. States when walking she deviates to the left. Reports dizziness is usually when standing up.

## 2020-08-21 NOTE — ED Provider Notes (Signed)
Patient care assumed from [facility] [place], [place].  Please refer to their note for further details.  Briefly, this is an 80 year old female PMHx BPPV, breast cancer, HTN, HLD, depression, anxiety, B12 deficiency, vitamin D deficiency, [facility] EMS via UC as code stroke (activated 1249), for dizziness onset 0930 today.  She was completely at baseline prior to onset.  She additionally felt that she was leaning to the left. Symptoms have since resolved.  Neuro evaluated, possible positive Dix-Hallpike.  Plan at time of signout is to follow-up on MRI.  If MRI normal, patient is to return home.  Recheck to neuro for any abnormalities.  Physical Exam  BP (!) 145/86 (BP Location: Right Arm)   Pulse 82   Temp 98.1 F (36.7 C)   Resp 17   Ht 5\' 3"  (1.6 m)   Wt 69 kg   SpO2 100%   BMI 26.95 kg/m   Physical Exam Vitals and nursing note reviewed.  Constitutional:      Appearance: Normal appearance.  HENT:     Head: Normocephalic and atraumatic.  Eyes:     Extraocular Movements: Extraocular movements intact.     Conjunctiva/sclera: Conjunctivae normal.  Cardiovascular:     Rate and Rhythm: Normal rate.  Pulmonary:     Effort: Pulmonary effort is normal.  Musculoskeletal:     Cervical back: Normal range of motion.  Neurological:     General: No focal deficit present.     Mental Status: She is alert and oriented to person, place, and time. Mental status is at baseline.    ED Course/Procedures     Procedures  MDM   Patient was reevaluated after signout, she is HDS and seated comfortably.  All studies independently reviewed by myself, d/w the attending physician, factored into my MDM. -MRI brain without evidence of acute intracranial pathology  Feel the patient is therefore stable for discharge home with close outpatient follow-up.  Findings relayed to patient.  Recommended she follow-up with her PCP in the next couple days, as well as with PT recommended discussing performing Epley maneuver  with PCP as her symptoms are most likely due to BPPV.  Return precautions discussed.  She understands and agrees to this plan.  Patient HDS, nontoxic, ambulatory on reevaluation and subsequently discharged.  1. Dizziness      Levin Bacon, MD 08/22/20 [id number]    Dorie Rank, MD 08/22/20 1012

## 2020-08-21 NOTE — ED Notes (Signed)
Pt transported to MRI 

## 2020-08-21 NOTE — Plan of Care (Signed)
MRI with and without reviewed and report noted. No stroke or metastasis. OK to discharge from neuro standpoint. Please prescribe some meclizine PRN use. She has PT to follow next week, recommend BPPV maneuver training with PT. She will need to follow up with her eye doctor Dr. Carolynn Sayers for BRAO follow up. Will sign off for now, please call with questions.   Rosalin Hawking, MD PhD Stroke Neurology 08/21/2020 5:49 PM

## 2020-08-21 NOTE — Discharge Instructions (Addendum)
GO to ER to r/o a TIA

## 2020-08-21 NOTE — ED Triage Notes (Signed)
Pt arrived to ED via EMS via UC as a CODE STROKE that was activated by UC at 1249. Pt had onset of dizziness at 0930 which is also LKW. Pt also reported feeling like she was leaning to the left. Pt has hx of vertigo. Upon arrival to ED pt s/s resolved. Pt A&Ox4. 22g L FA.

## 2020-08-21 NOTE — ED Notes (Addendum)
Code stroke called at 1244. Called report to ED charge RN at 1245. Notified Agricultural consultant of LKN 0930.

## 2020-08-23 ENCOUNTER — Ambulatory Visit: Payer: Medicare Other

## 2020-08-23 NOTE — Addendum Note (Signed)
Addended by: [name] on: 08/23/2020 07:29 AM   Modules accepted: Level of Service

## 2020-08-23 NOTE — Assessment & Plan Note (Signed)
Recurrent.  Recent symptoms are resolving

## 2020-08-24 ENCOUNTER — Ambulatory Visit: Payer: Medicare Other | Admitting: Internal Medicine

## 2020-08-28 ENCOUNTER — Ambulatory Visit (INDEPENDENT_AMBULATORY_CARE_PROVIDER_SITE_OTHER): Payer: Medicare Other | Admitting: Internal Medicine

## 2020-08-28 ENCOUNTER — Other Ambulatory Visit: Payer: Self-pay

## 2020-08-28 ENCOUNTER — Encounter: Payer: Self-pay | Admitting: Internal Medicine

## 2020-08-28 DIAGNOSIS — I679 Cerebrovascular disease, unspecified: Secondary | ICD-10-CM | POA: Diagnosis not present

## 2020-08-28 DIAGNOSIS — G459 Transient cerebral ischemic attack, unspecified: Secondary | ICD-10-CM | POA: Diagnosis not present

## 2020-08-28 DIAGNOSIS — F419 Anxiety disorder, unspecified: Secondary | ICD-10-CM | POA: Diagnosis not present

## 2020-08-28 DIAGNOSIS — F4321 Adjustment disorder with depressed mood: Secondary | ICD-10-CM

## 2020-08-28 DIAGNOSIS — R42 Dizziness and giddiness: Secondary | ICD-10-CM | POA: Diagnosis not present

## 2020-08-28 MED ORDER — VENLAFAXINE HCL ER 37.5 MG PO CP24: 37.5000 mg | ORAL_CAPSULE | Freq: Every day | ORAL | 3 refills | Status: DC

## 2020-08-28 MED ORDER — CLOPIDOGREL BISULFATE 75 MG PO TABS: 75.0000 mg | ORAL_TABLET | Freq: Every day | ORAL | 3 refills | Status: AC

## 2020-08-28 NOTE — Assessment & Plan Note (Signed)
No relapse - MRI reviewed D/c ASA. Cont w/Plavix

## 2020-08-28 NOTE — Assessment & Plan Note (Signed)
Reduce Effexor

## 2020-08-28 NOTE — Assessment & Plan Note (Signed)
No relapse D/c ASA. Cont w/Plavix

## 2020-08-28 NOTE — Assessment & Plan Note (Signed)
Take Xanax twice a day if needed Reduce Effexor to 37.5 mg/d

## 2020-08-28 NOTE — Assessment & Plan Note (Signed)
Benign Positional Vertigo symptoms   Meclizine. Start Brandt - Daroff exercise several times a day as dirrected.  

## 2020-08-28 NOTE — Patient Instructions (Signed)
Take Xanax twice a day if needed Reduce Effexor to 37.5 mg/d  Benign Positional Vertigo symptoms  Start Laruth Bouchard - Daroff exercise several times a day as dirrected.

## 2020-08-28 NOTE — Progress Notes (Signed)
Subjective:  Patient ID: [name] [name], female    DOB: 24-Apr-1940  Age: 80 y.o. MRN: [id number]  CC: Follow-up (ER Follow-up- Dx Vertigo)   HPI [name] [name] presents for recurrent vertigo - recurrent F/u anxiety, HTN  Outpatient Medications Prior to Visit  Medication Sig Dispense Refill   ALPRAZolam (XANAX) 1 MG tablet TAKE 0.5-1 TABLETS (0.5-1 MG TOTAL) BY MOUTH 2 (TWO) TIMES DAILY AS NEEDED FOR ANXIETY. 60 tablet 1   anastrozole (ARIMIDEX) 1 MG tablet TAKE 1 TABLET BY MOUTH EVERY DAY 90 tablet 4   aspirin EC 81 MG EC tablet Take 1 tablet (81 mg total) by mouth daily. Swallow whole. 30 tablet 1   cholecalciferol (VITAMIN D) 1000 units tablet Take 2 tablets (2,000 Units total) by mouth daily. 100 tablet 3   clopidogrel (PLAVIX) 75 MG tablet Take 1 tablet (75 mg total) by mouth daily for 21 days. 21 tablet 0   levothyroxine (SYNTHROID) 112 MCG tablet TAKE 1 TABLET (112 MCG TOTAL) BY MOUTH DAILY. 90 tablet 2   meclizine (ANTIVERT) 25 MG tablet Take 1 tablet (25 mg total) by mouth 3 (three) times daily as needed for dizziness. 30 tablet 0   rosuvastatin (CRESTOR) 20 MG tablet Take 1 tablet (20 mg total) by mouth daily. 30 tablet 1   telmisartan (MICARDIS) 40 MG tablet TAKE 1 TABLET BY MOUTH EVERY DAY 90 tablet 0   venlafaxine XR (EFFEXOR-XR) 75 MG 24 hr capsule TAKE 1 CAPSULE (75 MG TOTAL) BY MOUTH DAILY WITH BREAKFAST. 90 capsule 1   vitamin B-12 (CYANOCOBALAMIN) 1000 MCG tablet Take 1,000 mcg by mouth daily.     No facility-administered medications prior to visit.    ROS: Review of Systems  Constitutional:  Negative for activity change, appetite change, chills, diaphoresis, fatigue, fever and unexpected weight change.  HENT:  Negative for congestion, dental problem, ear pain, hearing loss, mouth sores, postnasal drip, sinus pressure, sneezing, sore throat and voice change.   Eyes:  Negative for pain and visual disturbance.  Respiratory:  Negative for cough, chest tightness,  wheezing and stridor.   Cardiovascular:  Negative for chest pain, palpitations and leg swelling.  Gastrointestinal:  Negative for abdominal distention, abdominal pain, blood in stool, nausea, rectal pain and vomiting.  Genitourinary:  Negative for decreased urine volume, difficulty urinating, dysuria, frequency, hematuria, menstrual problem, vaginal bleeding, vaginal discharge and vaginal pain.  Musculoskeletal:  Positive for arthralgias. Negative for back pain, gait problem, joint swelling and neck pain.  Skin:  Negative for color change, rash and wound.  Neurological:  Positive for dizziness. Negative for tremors, syncope, speech difficulty, weakness and light-headedness.  Hematological:  Negative for adenopathy.  Psychiatric/Behavioral:  Positive for sleep disturbance. Negative for behavioral problems, confusion, decreased concentration, dysphoric mood, hallucinations and suicidal ideas. The patient is nervous/anxious. The patient is not hyperactive.    Objective:  BP 122/88 (BP Location: Left Arm)   Pulse (!) 115   Temp 97.7 F (36.5 C) (Oral)   Ht 5\' 3"  (1.6 m)   Wt 145 lb (65.8 kg)   SpO2 97%   BMI 25.69 kg/m   BP Readings from Last 3 Encounters:  08/28/20 122/88  08/21/20 (!) 151/89  08/21/20 (!) 127/93    Wt Readings from Last 3 Encounters:  08/28/20 145 lb (65.8 kg)  08/21/20 152 lb 1.9 oz (69 kg)  08/16/20 145 lb 9.6 oz (66 kg)    Physical Exam Constitutional:      General: She is not in  acute distress.    Appearance: Normal appearance. She is well-developed.  HENT:     Head: Normocephalic.     Right Ear: External ear normal.     Left Ear: External ear normal.     Nose: Nose normal.  Eyes:     General:        Right eye: No discharge.        Left eye: No discharge.     Conjunctiva/sclera: Conjunctivae normal.     Pupils: Pupils are equal, round, and reactive to light.  Neck:     Thyroid: No thyromegaly.     Vascular: No JVD.     Trachea: No tracheal  deviation.  Cardiovascular:     Rate and Rhythm: Normal rate and regular rhythm.     Heart sounds: Normal heart sounds.  Pulmonary:     Effort: No respiratory distress.     Breath sounds: No stridor. No wheezing.  Abdominal:     General: Bowel sounds are normal. There is no distension.     Palpations: Abdomen is soft. There is no mass.     Tenderness: There is no abdominal tenderness. There is no guarding or rebound.  Musculoskeletal:        General: No tenderness.     Cervical back: Normal range of motion and neck supple. No rigidity.  Lymphadenopathy:     Cervical: No cervical adenopathy.  Skin:    Findings: No erythema or rash.  Neurological:     Cranial Nerves: No cranial nerve deficit.     Motor: No abnormal muscle tone.     Coordination: Coordination normal.     Deep Tendon Reflexes: Reflexes normal.  Psychiatric:        Behavior: Behavior normal.        Thought Content: Thought content normal.        Judgment: Judgment normal.    Lab Results  Component Value Date   WBC 4.4 08/21/2020   HGB 12.2 08/21/2020   HCT 36.0 08/21/2020   PLT 249 08/21/2020   GLUCOSE 123 (H) 08/21/2020   CHOL 134 08/10/2020   TRIG 263 (H) 08/10/2020   HDL 37 (L) 08/10/2020   LDLDIRECT 125.0 06/05/2015   LDLCALC 44 08/10/2020   ALT 16 08/21/2020   AST 27 08/21/2020   NA 139 08/21/2020   K 3.8 08/21/2020   CL 104 08/21/2020   CREATININE 1.00 08/21/2020   BUN 15 08/21/2020   CO2 24 08/21/2020   TSH 0.87 03/14/2020   INR 1.0 08/21/2020   HGBA1C 6.4 (H) 08/10/2020    MR BRAIN W WO CONTRAST  Result Date: 08/21/2020 CLINICAL DATA:  Dizziness EXAM: MRI HEAD WITHOUT AND WITH CONTRAST TECHNIQUE: Multiplanar, multiecho pulse sequences of the brain and surrounding structures were obtained without and with intravenous contrast. CONTRAST:  6.76mL GADAVIST GADOBUTROL 1 MMOL/ML IV SOLN COMPARISON:  08/09/2020 FINDINGS: Brain: There is no acute infarction or intracranial hemorrhage. There is no  intracranial mass, mass effect, or edema. There is no hydrocephalus or extra-axial fluid collection. Prominence of the ventricles and sulci reflects generalized parenchymal volume loss. Confluent areas of T2 hyperintensity in the supratentorial greater than pontine white matter nonspecific but probably reflect advanced chronic microvascular ischemic changes. No abnormal enhancement. Vascular: Major vessel flow voids at the skull base are preserved. Skull and upper cervical spine: Normal marrow signal is preserved. Sinuses/Orbits: Mild mucosal thickening.  Orbits are unremarkable. Other: Sella is unremarkable.  Mastoid air cells are clear. IMPRESSION: No evidence of  recent infarction, hemorrhage, or mass. Advanced chronic microvascular ischemic changes. No substantial change since recent prior study. Electronically Signed   By: Macy Mis M.D.   On: 08/21/2020 16:23   CT HEAD CODE STROKE WO CONTRAST  Result Date: 08/21/2020 CLINICAL DATA:  Code stroke. Dizziness. Deviating to the left side with walking. Last seen normal 0930 hours. EXAM: CT HEAD WITHOUT CONTRAST TECHNIQUE: Contiguous axial images were obtained from the base of the skull through the vertex without intravenous contrast. COMPARISON:  Brain MRI 08/09/2020.  Head CT 03/17/2020. FINDINGS: Brain: Age related atrophy. Chronic small-vessel ischemic changes of the pons. No focal cerebellar finding. Confluent chronic small vessel ischemic changes throughout the hemispheric white matter. No cortical or large vessel territory infarction. No mass lesion, hemorrhage, hydrocephalus or extra-axial collection. Vascular: No abnormal vascular finding. Skull: Negative Sinuses/Orbits: Clear/normal Other: None ASPECTS ([facility] Stroke Program Early CT Score) - Ganglionic level infarction (caudate, lentiform nuclei, internal capsule, insula, M1-M3 cortex): 7 - Supraganglionic infarction (M4-M6 cortex): 3 Total score (0-10 with 10 being normal): 10 IMPRESSION: 1.  Atrophy with chronic small-vessel ischemic changes, extensive throughout the cerebral hemispheric white matter. No acute finding by CT. 2. ASPECTS is 10 3. These results were communicated to Dr. Erlinda Hong at 1:41 pm on 08/21/2020 by text page via the [facility] messaging system. Electronically Signed   By: Nelson Chimes M.D.   On: 08/21/2020 13:42    Assessment & Plan:   There are no diagnoses linked to this encounter.   No orders of the defined types were placed in this encounter.    Follow-up: No follow-ups on file.  Walker Kehr, MD

## 2020-09-04 ENCOUNTER — Ambulatory Visit: Payer: Medicare Other | Admitting: Internal Medicine

## 2020-09-12 ENCOUNTER — Ambulatory Visit: Payer: Medicare Other | Attending: Internal Medicine | Admitting: Physical Therapy

## 2020-09-12 ENCOUNTER — Other Ambulatory Visit: Payer: Self-pay

## 2020-09-12 ENCOUNTER — Encounter: Payer: Self-pay | Admitting: Physical Therapy

## 2020-09-12 DIAGNOSIS — H8112 Benign paroxysmal vertigo, left ear: Secondary | ICD-10-CM | POA: Diagnosis not present

## 2020-09-12 DIAGNOSIS — R2681 Unsteadiness on feet: Secondary | ICD-10-CM | POA: Diagnosis not present

## 2020-09-12 DIAGNOSIS — R42 Dizziness and giddiness: Secondary | ICD-10-CM | POA: Insufficient documentation

## 2020-09-12 DIAGNOSIS — H8111 Benign paroxysmal vertigo, right ear: Secondary | ICD-10-CM | POA: Diagnosis not present

## 2020-09-13 ENCOUNTER — Telehealth: Payer: Self-pay

## 2020-09-13 NOTE — Therapy (Signed)
[facility] [address] [place], [place], [address] Phone: [phone]   Fax:  [phone]  Physical Therapy Evaluation  Patient Details  Name: [name] [name] MRN: [id number] Date of Birth: 16-Feb-1940 Referring Provider (PT): Dr. Lew Dawes   Encounter Date: 09/12/2020   PT End of Session - 09/13/20 1124     Visit Number 1    Number of Visits 8    Date for PT Re-Evaluation 10/06/20    Authorization Type BCBS Medicare    Authorization Time Period 09-12-20 - 10-13-20    PT Start Time 1150    PT Stop Time 1237    PT Time Calculation (min) 47 min    Activity Tolerance Patient tolerated treatment well    Behavior During Therapy [facility] for tasks assessed/performed             Past Medical History:  Diagnosis Date   Allergic rhinitis    Anxiety    Breast cancer ([place]) hx 2004   recurrent 2006 DR Magrinat   Family history of breast cancer    Genetic testing 12/05/2016   Multi-Cancer panel (83 genes) @ [facility] - Monoallelic mutation in [place] (carrier)   HTN (hypertension)    Hyperlipidemia    Hypothyroidism    Personal history of chemotherapy 2002   Personal history of radiation therapy 2002   Psoriasis    S/P thyroidectomy 07/2005   2 cm largest diameter (1 other tiny focus)/ i-131 rx 99 mci 08/2005   Thyroid cancer ([place])    Papillary Stage 1 - Dr Loanne Drilling   Vitamin B12 deficiency 2009   Vitamin D deficiency 2009    Past Surgical History:  Procedure Laterality Date   BREAST LUMPECTOMY     BREAST LUMPECTOMY WITH RADIOACTIVE SEED AND SENTINEL LYMPH NODE BIOPSY Left 09/11/2016   Procedure: LEFT BREAST LUMPECTOMY WITH RADIOACTIVE SEED AND LEFT AXILLARY SENTINEL LYMPH NODE BIOPSY WITH BLUE DYE INJECTION;  Surgeon: [name], MD;  Location: [place];  Service: General;  Laterality: Left;   IR FLUORO GUIDE PORT INSERTION RIGHT  09/13/2016   IR REMOVAL TUN ACCESS W/ PORT W/O FL MOD SED   12/30/2016   IR US GUIDE VASC ACCESS RIGHT  09/13/2016   MASTECTOMY Right    Right   PORTACATH PLACEMENT N/A 09/11/2016   Procedure: ATTEMPTED INSERTION PORT-A-CATH WITH ULTRA SOUND GUIDANCE;  Surgeon: [name], MD;  Location: [place];  Service: General;  Laterality: N/A;   REDUCTION MAMMAPLASTY Left 2005   THYROIDECTOMY  2007    There were no vitals filed for this visit.    Subjective Assessment - 09/12/20 1156     Subjective Pt reports she went to [facility] ED on 08-09-20 with visual disturbance - had Rt branch retinal artery & vein occlusion.  Pt was discharged home on 08-10-20; had episode of vertigo on 08-21-20 - went to Urgent Care, then was sent to ED to rule out CVA.  MRI was done and was negative.  Pt reports dizziness is intermittent - it comes and goes. Pt does report vertigo at time of eval - needed min assist to amb. from clinic lobby to treatment room.  Pt states she took Meclizine this am prior to appt.    Patient is accompained by: Family member   husband waiting in lobby   Pertinent History TIA on 08-09-20    Patient Stated Goals resolve the vertigo    Currently in Pain? No/denies  [facility] PT Assessment - 09/13/20 0001       Assessment   Medical Diagnosis Vertigo    Referring Provider (PT) Dr. Tyrone Apple Plotnikov    Onset Date/Surgical Date 08/21/20      Precautions   Precautions Fall;Other (comment)   vertigo     Restrictions   Weight Bearing Restrictions No      Balance Screen   Has the patient fallen in the past 6 months No    Has the patient had a decrease in activity level because of a fear of falling?  No    Is the patient reluctant to leave their home because of a fear of falling?  No      Prior Function   Level of Independence Independent      Observation/Other Assessments   Focus on Therapeutic Outcomes (FOTO)  DPS 50/100:  DFS 41.8                    Vestibular Assessment - 09/13/20 0001        Vestibular Assessment   General Observation pt is an 80 yr old lady with onset of vertigo on 08-21-20 when she got out of bed.  Pt reports she has dizziness when she turns onto her Lt side      Symptom Behavior   Subjective history of current problem started on 08-21-20 for this episode; pt states she fell down onto bed on her Lt side    Type of Dizziness  Spinning    Frequency of Dizziness varies  - depends on the movement    Duration of Dizziness secs to minutes    Symptom Nature Positional    Aggravating Factors Rolling to left;Forward bending;Sitting with head tilted back    Relieving Factors Head stationary;Lying supine    Progression of Symptoms No change since onset    History of similar episodes pt reports she has had episodes of vertigo in the past but states it usually resolves      Oculomotor Exam   Oculomotor Alignment Normal    Spontaneous Absent    Smooth Pursuits Intact    Saccades Intact      Positional Testing   Dix-Hallpike Dix-Hallpike Right;Dix-Hallpike Left    Sidelying Test Sidelying Right;Sidelying Left      Dix-Hallpike Right   Dix-Hallpike Right Duration none    Dix-Hallpike Right Symptoms No nystagmus      Dix-Hallpike Left   Dix-Hallpike Left Duration c/o dizziness (mild)    Dix-Hallpike Left Symptoms No nystagmus      Sidelying Right   Sidelying Right Duration none    Sidelying Right Symptoms No nystagmus      Sidelying Left   Sidelying Left Duration none    Sidelying Left Symptoms No nystagmus                Objective measurements completed on examination: See above findings.               PT Education - 09/13/20 1122     Education Details reviewed Nestor Lewandowsky exs as she had been instructed by MD in hospital    Person(s) Educated Patient    Methods Explanation    Comprehension Verbalized understanding              PT Short Term Goals - 09/13/20 1133       PT SHORT TERM GOAL #1   Title same as LTG's  PT Long Term Goals - 09/13/20 1133       PT LONG TERM GOAL #1   Title Pt will have (-) Lt Dix-Hallpike test with no nystamus and no c/o vertigo in test position and no c/o dizziness with return to upright to indicate resolution of Lt BPPV.    Time 4    Period Weeks    Status New    Target Date 10/06/20      PT LONG TERM GOAL #2   Title Improve FOTO DPS score from 50 to >/= 55/100 to indicate improvement in dizziness.    Baseline 50/100 DPS    Time 4    Period Weeks    Status New    Target Date 10/06/20      PT LONG TERM GOAL #3   Title Pt will amb. 44' without device with intermittent horizontal head turns without LOB independently.    Time 4    Period Weeks    Status New    Target Date 10/06/20      PT LONG TERM GOAL #4   Title Independent in [place] Daroff exercises and also in Epley maneuver for self treatment prn pending reoccurrence of BPPV.    Time 4    Period Weeks    Status New    Target Date 10/06/20                    Plan - 09/13/20 1125     Clinical Impression Statement Pt has symptoms of Lt BPPV, however, no nystagmus was observed with any positional testing; this may have been masked by Meclizine, which pt reported she took prior to PT appt due to severity of dizziness this am.  Epley maneuver was performed 1 repetition and no nystagmus was noted in any position and pt reported no spinning vertigo in any position of Epley.  Pt did report moderate dizziness with Rt and Lt sidelying to sitting and also with coming up from both Rt and Lt Dix-Hallpike test positions.  Pt did report she felt better at end of session with improved balance and steadiness with ambulation noted at end of session compared to start of session. Will cont to assess vertigo.    Personal Factors and Comorbidities Behavior Pattern;Comorbidity 2;Age    Comorbidities anxiety/depression, HTN, h/o TIA on 08-09-20, h/o thyroid cancer, tachycardia, h/o Lt breast cancer, h/o chronic  episodic vertigo    Examination-Activity Limitations Bend;Locomotion Level;Transfers;Bed Mobility;Reach Overhead;Stairs;Stand;Squat    Examination-Participation Restrictions Cleaning;Meal Prep;Driving;Community Activity;Shop    Stability/Clinical Decision Making Evolving/Moderate complexity    Clinical Decision Making Moderate    Rehab Potential Good    PT Frequency 2x / week    PT Duration 4 weeks    PT Treatment/Interventions ADLs/Self Care Home Management;Canalith Repostioning;Vestibular;Patient/family education;Therapeutic exercise;Therapeutic activities;Balance training;Neuromuscular re-education;Gait training    PT Next Visit Plan recheck Lt BPPV and rx prn    PT Home Exercise Plan Pt was issued Brandt-Daroff exs by MD    Consulted and Agree with Plan of Care Patient             Patient will benefit from skilled therapeutic intervention in order to improve the following deficits and impairments:  Dizziness, Decreased balance, Difficulty walking  Visit Diagnosis: BPPV (benign paroxysmal positional vertigo), left - Plan: PT plan of care cert/re-cert  Dizziness and giddiness - Plan: PT plan of care cert/re-cert  Unsteadiness on feet - Plan: PT plan of care cert/re-cert     Problem List Patient Active  Problem List   Diagnosis Date Noted   Retinal artery occlusion, branch, right 08/09/2020   TIA (transient ischemic attack) 08/09/2020   Hyperparathyroidism ([place]) 06/13/2020   Skin lesion 03/21/2020   Cerebrovascular disease 03/21/2020   Pruritus 03/14/2020   Ataxia 07/20/2019   Seroma of breast 07/20/2019   Cerumen impaction 03/23/2019   Wrist pain 03/23/2019   Right ear pain 06/27/2018   Coronary artery disease 01/20/2018   Breast cyst, left 09/17/2017   Aortic atherosclerosis ([place]) 07/28/2017   Osteoporosis 05/07/2017   Genetic testing 12/05/2016   Family history of breast cancer    Port catheter in place 10/23/2016   Encounter for antineoplastic chemotherapy  10/23/2016   Malignant neoplasm of upper-outer quadrant of left breast in female, estrogen receptor positive ([place]) 07/29/2016   Well adult exam 06/05/2015   Neoplasm of uncertain behavior of skin 06/05/2015   Adenopathy, cervical 06/05/2015   Tachycardia 06/07/2014   Wart viral 10/18/2011   Hyperglycemia 11/06/2010   Actinic keratoses 05/25/2010   Essential hypertension 02/23/2010   SHOULDER PAIN 11/22/2009   Chronic fatigue 11/08/2009   RLQ PAIN 11/08/2009   VERTIGO 07/11/2009   ECZEMA 10/19/2008   CT, CHEST, ABNORMAL 05/30/2008   STYE 02/17/2008   B12 deficiency 12/16/2007   Vitamin D deficiency 12/16/2007   HYPOTHYROIDISM, POSTSURGICAL 12/01/2007   Acute maxillary sinusitis 11/11/2007   ALLERGIC RESPIRATORY DISEASE, EXTRINSIC 11/11/2007   THYROID CANCER 08/19/2007   Dyslipidemia 08/19/2007   Anxiety disorder 08/19/2007   Situational depression 08/19/2007   Allergic rhinitis 08/19/2007    Kawehi Hostetter, [place], PT 09/13/2020, 11:43 AM  [facility] [address] [place], [place], [address] Phone: [phone]   Fax:  [phone]  Name: JASAMINE HIRA MRN: [id number] Date of Birth: 1940/08/17

## 2020-09-13 NOTE — Telephone Encounter (Signed)
Called and canceled lab appt on 8/8. Wilber Bihari, NP reviewed recent labs and she does not need lab on 8/8. Reviewed upcoming appt times. She verbalized understanding.

## 2020-09-14 ENCOUNTER — Other Ambulatory Visit: Payer: Self-pay

## 2020-09-14 ENCOUNTER — Ambulatory Visit: Payer: Medicare Other | Admitting: Physical Therapy

## 2020-09-14 DIAGNOSIS — H8111 Benign paroxysmal vertigo, right ear: Secondary | ICD-10-CM

## 2020-09-14 DIAGNOSIS — H8112 Benign paroxysmal vertigo, left ear: Secondary | ICD-10-CM | POA: Diagnosis not present

## 2020-09-14 DIAGNOSIS — R42 Dizziness and giddiness: Secondary | ICD-10-CM | POA: Diagnosis not present

## 2020-09-14 DIAGNOSIS — R2681 Unsteadiness on feet: Secondary | ICD-10-CM | POA: Diagnosis not present

## 2020-09-15 ENCOUNTER — Telehealth: Payer: Self-pay | Admitting: *Deleted

## 2020-09-15 NOTE — Therapy (Signed)
[facility] [address] [place], [place], [address] Phone: [phone]   Fax:  [phone]  Physical Therapy Treatment  Patient Details  Name: [name] [name] MRN: [id number] Date of Birth: [birth year] Referring Provider (PT): Dr. Lew Dawes   Encounter Date: 09/14/2020   PT End of Session - 09/15/20 1443     Visit Number 2    Number of Visits 8    Date for PT Re-Evaluation 10/06/20    Authorization Type BCBS Medicare    Authorization Time Period 09-12-20 - 10-13-20    PT Start Time 1402    PT Stop Time 1445    PT Time Calculation (min) 43 min    Activity Tolerance Patient tolerated treatment well    Behavior During Therapy [facility] for tasks assessed/performed             Past Medical History:  Diagnosis Date   Allergic rhinitis    Anxiety    Breast cancer ([place]) hx 2004   recurrent 2006 DR Magrinat   Family history of breast cancer    Genetic testing 12/05/2016   Multi-Cancer panel (83 genes) @ [facility] - Monoallelic mutation in [place] (carrier)   HTN (hypertension)    Hyperlipidemia    Hypothyroidism    Personal history of chemotherapy 2002   Personal history of radiation therapy 2002   Psoriasis    S/P thyroidectomy 07/2005   2 cm largest diameter (1 other tiny focus)/ i-131 rx 99 mci 08/2005   Thyroid cancer ([place])    Papillary Stage 1 - Dr Loanne Drilling   Vitamin B12 deficiency 2009   Vitamin D deficiency 2009    Past Surgical History:  Procedure Laterality Date   BREAST LUMPECTOMY     BREAST LUMPECTOMY WITH RADIOACTIVE SEED AND SENTINEL LYMPH NODE BIOPSY Left 09/11/2016   Procedure: LEFT BREAST LUMPECTOMY WITH RADIOACTIVE SEED AND LEFT AXILLARY SENTINEL LYMPH NODE BIOPSY WITH BLUE DYE INJECTION;  Surgeon: [name], MD;  Location: [place];  Service: General;  Laterality: Left;   IR FLUORO GUIDE PORT INSERTION RIGHT  09/13/2016   IR REMOVAL TUN ACCESS W/ PORT W/O FL MOD SED   12/30/2016   IR US GUIDE VASC ACCESS RIGHT  09/13/2016   MASTECTOMY Right    Right   PORTACATH PLACEMENT N/A 09/11/2016   Procedure: ATTEMPTED INSERTION PORT-A-CATH WITH ULTRA SOUND GUIDANCE;  Surgeon: [name], MD;  Location: [place];  Service: General;  Laterality: N/A;   REDUCTION MAMMAPLASTY Left 2005   THYROIDECTOMY  2007    There were no vitals filed for this visit.   Subjective Assessment - 09/15/20 1441     Subjective Pt reports she had no vertigo at all yesterday (Wed.) but states she woke up with it this am; pt states she did not take Meclizine today    Patient is accompained by: Family member   husband waiting in lobby   Pertinent History TIA on 08-09-20    Patient Stated Goals resolve the vertigo    Currently in Pain? No/denies                                Vestibular Treatment/Exercise - 09/15/20 0001       Vestibular Treatment/Exercise   Vestibular Treatment Provided Canalith Repositioning    Canalith Repositioning Epley Manuever Right       EPLEY MANUEVER RIGHT   Number of Reps  3    Overall Response Improved Symptoms    Response Details  no nystagmus and no c/o vertigo on 3rd rep of Epley             Lt Dix-Hallpike test (-) with no nystagmus and no c/o vertigo  Rt Dix-Hallpike test (+) with Rt rotary upbeating nystagmus and c/o vertigo in test position  Reviewed Nestor Lewandowsky exercises for self treatment prn    PT Education - 09/15/20 1442     Education Details reviewed Nestor Lewandowsky exercises; educated pt in etiology of BPPV with article from [place] given to pt    Person(s) Educated Patient    Methods Explanation;Demonstration;Handout    Comprehension Verbalized understanding              PT Short Term Goals - 09/13/20 1133       PT SHORT TERM GOAL #1   Title same as LTG's               PT Long Term Goals - 09/15/20 1446       PT LONG TERM GOAL #1   Title Pt will have (-) Lt  Dix-Hallpike test with no nystamus and no c/o vertigo in test position and no c/o dizziness with return to upright to indicate resolution of Lt BPPV.    Time 4    Period Weeks    Status New      PT LONG TERM GOAL #2   Title Improve FOTO DPS score from 50 to >/= 55/100 to indicate improvement in dizziness.    Baseline 50/100 DPS    Time 4    Period Weeks    Status New      PT LONG TERM GOAL #3   Title Pt will amb. 71' without device with intermittent horizontal head turns without LOB independently.    Time 4    Period Weeks    Status New      PT LONG TERM GOAL #4   Title Independent in [place] Daroff exercises and also in Epley maneuver for self treatment prn pending reoccurrence of BPPV.    Time 4    Period Weeks    Status New                   Plan - 09/15/20 1444     Clinical Impression Statement Pt had (-) Lt Dix-Hallpike test indicative of resolution of Lt BPPV as treated in previous PT session, but had (+) Rt Dix-Hallpike test indicative of Rt BPPV. Pt was treated with 3 reps Epley maneuver and BPPV appeared to be fully resolved on 3rd rep as no nystagmus and no c/o vertigo noted.  Will continue to assess, as pt appears to have had multi canal BPPV.    Personal Factors and Comorbidities Behavior Pattern;Comorbidity 2;Age    Comorbidities anxiety/depression, HTN, h/o TIA on 08-09-20, h/o thyroid cancer, tachycardia, h/o Lt breast cancer, h/o chronic episodic vertigo    Examination-Activity Limitations Bend;Locomotion Level;Transfers;Bed Mobility;Reach Overhead;Stairs;Stand;Squat    Examination-Participation Restrictions Cleaning;Meal Prep;Driving;Community Activity;Shop    Stability/Clinical Decision Making Evolving/Moderate complexity    Rehab Potential Good    PT Frequency 2x / week    PT Duration 4 weeks    PT Treatment/Interventions ADLs/Self Care Home Management;Canalith Repostioning;Vestibular;Patient/family education;Therapeutic exercise;Therapeutic  activities;Balance training;Neuromuscular re-education;Gait training    PT Next Visit Plan recheck Rt BPPV and rx prn    PT Home Exercise Plan Pt was issued Brandt-Daroff exs by MD    Consulted and  Agree with Plan of Care Patient             Patient will benefit from skilled therapeutic intervention in order to improve the following deficits and impairments:  Dizziness, Decreased balance, Difficulty walking  Visit Diagnosis: BPPV (benign paroxysmal positional vertigo), right     Problem List Patient Active Problem List   Diagnosis Date Noted   Retinal artery occlusion, branch, right 08/09/2020   TIA (transient ischemic attack) 08/09/2020   Hyperparathyroidism ([place]) 06/13/2020   Skin lesion 03/21/2020   Cerebrovascular disease 03/21/2020   Pruritus 03/14/2020   Ataxia 07/20/2019   Seroma of breast 07/20/2019   Cerumen impaction 03/23/2019   Wrist pain 03/23/2019   Right ear pain 06/27/2018   Coronary artery disease 01/20/2018   Breast cyst, left 09/17/2017   Aortic atherosclerosis ([place]) 07/28/2017   Osteoporosis 05/07/2017   Genetic testing 12/05/2016   Family history of breast cancer    Port catheter in place 10/23/2016   Encounter for antineoplastic chemotherapy 10/23/2016   Malignant neoplasm of upper-outer quadrant of left breast in female, estrogen receptor positive ([place]) 07/29/2016   Well adult exam 06/05/2015   Neoplasm of uncertain behavior of skin 06/05/2015   Adenopathy, cervical 06/05/2015   Tachycardia 06/07/2014   Wart viral 10/18/2011   Hyperglycemia 11/06/2010   Actinic keratoses 05/25/2010   Essential hypertension 02/23/2010   SHOULDER PAIN 11/22/2009   Chronic fatigue 11/08/2009   RLQ PAIN 11/08/2009   VERTIGO 07/11/2009   ECZEMA 10/19/2008   CT, CHEST, ABNORMAL 05/30/2008   STYE 02/17/2008   B12 deficiency 12/16/2007   Vitamin D deficiency 12/16/2007   HYPOTHYROIDISM, POSTSURGICAL 12/01/2007   Acute maxillary sinusitis 11/11/2007    ALLERGIC RESPIRATORY DISEASE, EXTRINSIC 11/11/2007   THYROID CANCER 08/19/2007   Dyslipidemia 08/19/2007   Anxiety disorder 08/19/2007   Situational depression 08/19/2007   Allergic rhinitis 08/19/2007    Aimi Essner, [place], PT 09/15/2020, 2:49 PM  [facility] [address] [place], [place], [address] Phone: [phone]   Fax:  [phone]  Name: [name] [name] MRN: [id number] Date of Birth: May 21, 1940

## 2020-09-15 NOTE — Telephone Encounter (Signed)
-----   Message from Marguarite Arbour sent at 09/14/2020 11:55 AM EDT ----- For review

## 2020-09-15 NOTE — Telephone Encounter (Signed)
MD signed...Johny Chess

## 2020-09-18 ENCOUNTER — Ambulatory Visit: Payer: Self-pay

## 2020-09-18 ENCOUNTER — Ambulatory Visit (HOSPITAL_BASED_OUTPATIENT_CLINIC_OR_DEPARTMENT_OTHER): Payer: Self-pay | Admitting: Adult Health

## 2020-09-18 ENCOUNTER — Other Ambulatory Visit: Payer: Medicare Other

## 2020-09-18 DIAGNOSIS — Z5329 Procedure and treatment not carried out because of patient's decision for other reasons: Secondary | ICD-10-CM

## 2020-09-18 DIAGNOSIS — Z170 Estrogen receptor positive status [ER+]: Secondary | ICD-10-CM

## 2020-09-18 DIAGNOSIS — C50412 Malignant neoplasm of upper-outer quadrant of left female breast: Secondary | ICD-10-CM

## 2020-09-18 NOTE — Progress Notes (Signed)
. [name] [name]   DOB: 04-Oct-1940  MR#: [id number]  CSN#:[id number]  Patient Care Team: [name], MD as PCP - General Magrinat, [name], MD as Consulting Physician (Oncology) [name], MD as Consulting Physician (General Surgery) [name], MD as Consulting Physician (Gastroenterology) [name], MD as Consulting Physician (Radiation Oncology)   CHIEF COMPLAINT: Weakly estrogen receptor positive breast cancer (s/p right mastectomy)  CURRENT TREATMENT: Anastrozole, Prolia   INTERVAL HISTORY: [name] [name] was due for her follow up but did not show to her scheduled appoitnment.    REVIEW OF SYSTEMS:     COVID 19 VACCINATION STATUS: fully vaccinated [employer]), with booster 12/25/2019   BREAST CANCER HISTORY: From the recent summary note:  I last saw [name] [name] in 2013 when she was released from follow-up from her right breast cancer recurrence in 2004. She is status post right mastectomy and adjuvant chemotherapy for that triple negative tumor. More recently, on 07/16/2016 she underwent left screening mammography at the [facility] on 07/16/2016. This found the breast density to be category B. There was a possible asymmetry in the left breast, and on 07/19/2016 she underwent left diagnostic mammography with tomography and left breast ultrasonography. This confirmed a well circumscribed oval mass at the 9:00 position of the left breast measuring approximately 0.8 cm. This was not palpable. On ultrasonography the mass measured 0.6 cm and it was located at the 9:00 radiant 4 cm from the nipple. The left axilla was sonographically benign.  On June 11 [name] [name] underwent biopsy of the left breast mass in question and this showed ([id number]) and invasive ductal carcinoma, grade 3, estrogen receptor 5% positive with weak staining intensity, progesterone receptor negative, with an MIB-1 of 80%, and HER-2 nonamplified, the signals ratio being 1.46 and the number per cell  1.90.  Her subsequent history is as detailed below.   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[place] (carrier)   HTN (hypertension)    Hyperlipidemia    Hypothyroidism    Personal history of chemotherapy 2002   Personal history of radiation therapy 2002   Psoriasis    S/P thyroidectomy 07/2005   2 cm largest diameter (1 other tiny focus)/ i-131 rx 99 mci 08/2005   Thyroid cancer ([place])    Papillary Stage 1 - Dr Loanne Drilling   Vitamin B12 deficiency 2009   Vitamin D deficiency 2009    PAST SURGICAL HISTORY: Past Surgical History:  Procedure Laterality Date   BREAST LUMPECTOMY     BREAST LUMPECTOMY WITH RADIOACTIVE SEED AND SENTINEL LYMPH NODE BIOPSY Left 09/11/2016   Procedure: LEFT BREAST LUMPECTOMY WITH RADIOACTIVE SEED AND LEFT AXILLARY SENTINEL LYMPH NODE BIOPSY WITH BLUE DYE INJECTION;  Surgeon: [name], MD;  Location: [facility];  Service: General;  Laterality: Left;   IR FLUORO GUIDE PORT INSERTION RIGHT  09/13/2016   IR REMOVAL TUN ACCESS W/ PORT W/O FL MOD SED  12/30/2016   IR US GUIDE VASC ACCESS RIGHT  09/13/2016   MASTECTOMY Right    Right   PORTACATH PLACEMENT N/A 09/11/2016   Procedure: ATTEMPTED INSERTION PORT-A-CATH WITH ULTRA SOUND GUIDANCE;  Surgeon: [name], MD;  Location: [facility];  Service: General;  Laterality: N/A;   REDUCTION MAMMAPLASTY Left 2005   THYROIDECTOMY  2007    FAMILY HISTORY Family History  Problem Relation Age of Onset   Stroke Mother    Allergies Mother    Asthma Mother    Clotting disorder Mother    Heart disease Father 54       MI   Allergies Sister    Asthma Sister    Breast cancer Sister 63       Lymphoma 11; currently 46   Lymphoma Sister     Hyperparathyroidism Sister    Asthma Brother    Leukemia Brother        dx 95s   Allergies Daughter    Asthma Daughter    Allergies Sister    Breast cancer Sister 32       currently 84   Kidney cancer Paternal Aunt        kidney ca; deceased 24   Liver cancer Paternal Uncle        unk. primary ("liver")   Throat cancer Maternal Grandmother        deceased 30   Lung cancer Maternal Grandfather        deceased 38   Pancreatic cancer Paternal Grandfather        deceased 48   Cancer Paternal Aunt        "abdominal"; deceased 87  Family history includes a sister, a paternal aunt, and a paternal cousin with breast cancer all older then 95 at diagnosis   GYN HISTORY: Menarche age 31, first live birth age 62. She is GX P3. She went through the change of life age 83. She did not take hormone replacement.   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 Her husband [name] is retired from [employer]. Her children are [name] [name], who works for time [employer] in [place], and [name] [name], who lives in [place] and works for [employer]. The third child, [name] [name], died in an automobile accident at age 12. The patient has one granddaughter. Her dog [name] [name], just passed away at 81 years old. 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    HEALTH MAINTENANCE: Social History   Tobacco Use   Smoking status: Never   Smokeless tobacco: Never  Substance Use Topics   Alcohol use: No   Drug use: No     Colonoscopy:  PAP:  Bone density: 05/06/2017 showed a T score of -3.4 osteoporosis  Lipid panel:  Allergies  Allergen Reactions   Pneumococcal Vaccines Other (See Comments)    Was really sick    Sulfa Antibiotics Other (See Comments)    Pt believes it was hives   Sulfacetamide Sodium-Sulfur    Tape Itching, Dermatitis and Rash    Tegaderm Ag Mesh [Silver] Itching and Rash    Current Outpatient Medications  Medication Sig Dispense Refill   ALPRAZolam (XANAX) 1 MG tablet TAKE 0.5-1 TABLETS (0.5-1 MG TOTAL) BY MOUTH 2 (TWO) TIMES DAILY AS NEEDED FOR ANXIETY. 60 tablet 1   anastrozole (ARIMIDEX) 1 MG tablet TAKE 1 TABLET BY MOUTH EVERY DAY 90 tablet 4   cholecalciferol (VITAMIN D) 1000 units tablet Take 2 tablets (2,000 Units total) by mouth daily. 100 tablet 3   clopidogrel (PLAVIX) 75 MG tablet Take 1 tablet (75 mg total) by mouth daily for 21 days. [place]  tablet 3   levothyroxine (SYNTHROID) 112 MCG tablet TAKE 1 TABLET (112 MCG TOTAL) BY MOUTH DAILY. 90 tablet 2   meclizine (ANTIVERT) 25 MG tablet Take 1 tablet (25 mg total) by mouth 3 (three) times daily as needed for dizziness. 30 tablet 0   rosuvastatin (CRESTOR) 20 MG tablet Take 1 tablet (20 mg total) by mouth daily. 30 tablet 1   telmisartan (MICARDIS) 40 MG tablet TAKE 1 TABLET BY MOUTH EVERY DAY 90 tablet 0   venlafaxine XR (EFFEXOR XR) 37.5 MG 24 hr capsule Take 1 capsule (37.5 mg total) by mouth daily with breakfast. 90 capsule 3   vitamin B-12 (CYANOCOBALAMIN) 1000 MCG tablet Take 1,000 mcg by mouth daily.     No current facility-administered medications for this visit.    OBJECTIVE:   There were no vitals filed for this visit.    There is no height or weight on file to calculate BMI.    ECOG FS:     LAB RESULTS: Lab Results  Component Value Date   WBC 4.4 08/21/2020   NEUTROABS 1.7 08/21/2020   HGB 12.2 08/21/2020   HCT 36.0 08/21/2020   MCV 90.4 08/21/2020   PLT 249 08/21/2020      Chemistry      Component Value Date/Time   NA 139 08/21/2020 1326   NA 139 12/11/2016 1017   K 3.8 08/21/2020 1326   K 3.7 12/11/2016 1017   CL 104 08/21/2020 1326   CL 104 10/09/2011 1403   CO2 24 08/21/2020 1320   CO2 22 12/11/2016 1017   BUN 15 08/21/2020 1326   BUN 16.9 12/11/2016 1017   CREATININE 1.00 08/21/2020 1326   CREATININE 1.74 (H) 10/20/2019  1634   CREATININE 1.2 (H) 12/11/2016 1017      Component Value Date/Time   CALCIUM 9.9 08/21/2020 1320   CALCIUM 9.0 12/11/2016 1017   ALKPHOS 61 08/21/2020 1320   ALKPHOS 139 12/11/2016 1017   AST 27 08/21/2020 1320   AST 65 (H) 12/11/2016 1017   ALT 16 08/21/2020 1320   ALT 51 12/11/2016 1017   BILITOT 0.7 08/21/2020 1320   BILITOT 0.42 12/11/2016 1017       Lab Results  Component Value Date   LABCA2 16 10/09/2011    No components found for: [id number]  No results for input(s): INR in the last 168 hours.  Urinalysis    Component Value Date/Time   COLORURINE YELLOW 08/09/2020 1543   APPEARANCEUR CLEAR 08/09/2020 1543   LABSPEC 1.015 08/09/2020 1543   PHURINE 6.0 08/09/2020 1543   GLUCOSEU 50 (A) 08/09/2020 1543   GLUCOSEU NEGATIVE 06/05/2015 1559   HGBUR NEGATIVE 08/09/2020 1543   BILIRUBINUR NEGATIVE 08/09/2020 [place] 08/09/2020 1543   PROTEINUR NEGATIVE 08/09/2020 1543   UROBILINOGEN 0.2 06/05/2015 1559   NITRITE NEGATIVE 08/09/2020 1543   LEUKOCYTESUR SMALL (A) 08/09/2020 1543    STUDIES:   CLINICAL DATA:  Screening. History of left lumpectomy 2018 and remote right mastectomy.   EXAM: DIGITAL SCREENING UNILATERAL LEFT MAMMOGRAM WITH CAD AND TOMOSYNTHESIS   TECHNIQUE: Left screening digital craniocaudal and mediolateral oblique mammograms were obtained. Left screening digital breast tomosynthesis was performed. The images were evaluated with computer-aided detection.   COMPARISON:  Previous exam(s).   ACR Breast Density Category b: There are scattered areas of fibroglandular density.   FINDINGS: There are no findings suspicious for malignancy.   IMPRESSION: No mammographic evidence of malignancy. A result letter of this screening mammogram will  be mailed directly to the patient.   RECOMMENDATION: Screening mammogram in one year. (Code:[id number])   BI-RADS CATEGORY  1: Negative.     Electronically Signed   By: Everlean Alstrom M.D.   On: 08/09/2020 15:21  ASSESSMENT: 80 y.o. [place] woman   (1) status post right lumpectomy and sentinel lymph node biopsy in September 2002 for multifocal breast carcinoma, triple negative.  Treated with CMF followed by radiation.   (2) Local recurrence in April 2004, status post right modified radical mastectomy with TRAM reconstruction for what proved to be a T1c N0, stage IA triple negative breast carcinoma.  Treated adjuvantly with paclitaxel and doxorubicin x4 in 2004.  Off treatment since August 2004 with no evidence of recurrence  (3) history of papillary thyroid cancer s/p thyroidectomy June 2007, s/p radioactive iodine July 2007  (4) status post left breast upper outer quadrant biopsy 08/21/2016 for a clinical T1b N0, stage IB invasive ductal carcinoma, grade 3, weakly estrogen receptor positive, progesterone receptor and HER-2 negative, with an MIB-1 of 80%.  (5) left lumpectomy and sentinel lymph node sampling 09/11/2016 found a pT1b pN0, stage IB invasive ductal carcinoma, grade 3, with negative margins.  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 (p.Gln90*) (a) there were no deleterious mutations in ALK, APC, ATM, AXIN2, BAP1, BARD1, BLM, BMPR1A, BRCA1, BRCA2, BRIP1, CASR, CDC73, CDH1, CDK4, CDKN1B, CDKN1C, CDKN2A, CEBPA, CHEK2, CTNNA1, DICER1, DIS3L2, EGFR, EPCAM, FH, FLCN, GATA2, GPC3, GREM1, HOXB13, HRAS, KIT, MAX, MEN1, MET, MITF, MLH1, MSH2, MSH3, MSH6, MUTYH, NBN, NF1, NF2, NTHL1, PALB2, PDGFRA,  PHOX2B, PMS2, POLD1, POLE, POT1, PRKAR1A, PTCH1, PTEN, RAD50, RAD51C, RAD51D, RB1, RECQL4, RET, RUNX1, SDHA, SDHAF2, SDHB, SDHC, SDHD, SMAD4, SMARCA4, SMARCB1, SMARCE1, STK11, SUFU, TERC, TERT, TMEM127, TP53, TSC1, TSC2, VHL, WRN, WT1 (b) while homozygous mutations in the NPH L1 gene are associated with autosomal recessive polyposis, there is no evidence to suggest that a single mutation in this gene increases cancer risk.  (9) started anastrozole 03/14/2017 (a) bone density on 05/06/2017 showed a T score of -3.4--osteoporosis (b) started denosumab/Prolia June 2019, to be repeated every 6 months   PLAN:  [name] [name] did not show for her appointment.  I attempted to call her, but could not leave a voice mail as the mailbox was full.  A letter has been sent.      LiWilber BihariNP 09/18/20 7:52 AM Medical Oncology and Hematology CoSh[facility] Inc4[place] [id number]. [phone]  Fax. [phone]

## 2020-09-19 ENCOUNTER — Ambulatory Visit: Payer: Medicare Other | Admitting: Physical Therapy

## 2020-09-21 ENCOUNTER — Ambulatory Visit: Payer: Medicare Other | Admitting: Physical Therapy

## 2020-09-22 ENCOUNTER — Ambulatory Visit: Payer: Self-pay | Admitting: Adult Health

## 2020-09-22 ENCOUNTER — Ambulatory Visit: Payer: Self-pay

## 2020-09-28 ENCOUNTER — Encounter: Payer: Self-pay | Admitting: Internal Medicine

## 2020-09-28 ENCOUNTER — Telehealth (INDEPENDENT_AMBULATORY_CARE_PROVIDER_SITE_OTHER): Payer: Medicare Other | Admitting: Internal Medicine

## 2020-09-28 DIAGNOSIS — I679 Cerebrovascular disease, unspecified: Secondary | ICD-10-CM

## 2020-09-28 DIAGNOSIS — R42 Dizziness and giddiness: Secondary | ICD-10-CM

## 2020-09-28 DIAGNOSIS — I700 Atherosclerosis of aorta: Secondary | ICD-10-CM | POA: Diagnosis not present

## 2020-09-28 DIAGNOSIS — Z8673 Personal history of transient ischemic attack (TIA), and cerebral infarction without residual deficits: Secondary | ICD-10-CM | POA: Diagnosis not present

## 2020-09-28 DIAGNOSIS — J069 Acute upper respiratory infection, unspecified: Secondary | ICD-10-CM | POA: Insufficient documentation

## 2020-09-28 MED ORDER — CLOPIDOGREL BISULFATE 75 MG PO TABS
75.0000 mg | ORAL_TABLET | Freq: Every day | ORAL | 3 refills | Status: DC
Start: 2020-09-28 — End: 2021-11-05

## 2020-09-28 NOTE — Progress Notes (Signed)
Virtual Visit via Telephone Note  I connected with [name] on 09/28/20 at  3:40 PM EDT by telephone and verified that I am speaking with the correct person using two identifiers.  Location: Patient: Home Provider: [facility] [name] office No one else is present at the visit  I discussed the limitations, risks, security and privacy concerns of performing an evaluation and management service by telephone and the availability of in person appointments. I also discussed with the patient that there may be a patient responsible charge related to this service. The patient expressed understanding and agreed to proceed.   History of Present Illness: [name] [name] is complaining of sore throat starting this morning.  Her friend called her and informed her that she tested positive this week.  They were not contacted earlier this week.  No fever, cough, shortness of breath.  We need to follow-up on PEG is cerebrovascular disease, vertigo and hypertension   Observations/Objective: The patient sounds normal on the phone  Assessment and Plan:  See plan Follow Up Instructions:    I discussed the assessment and treatment plan with the patient. The patient was provided an opportunity to ask questions and all were answered. The patient agreed with the plan and demonstrated an understanding of the instructions.   The patient was advised to call back or seek an in-person evaluation if the symptoms worsen or if the condition fails to improve as anticipated.  I provided 21 minutes of non-face-to-face time during this encounter.   Walker Kehr, MD

## 2020-09-28 NOTE — Assessment & Plan Note (Signed)
Continue with Crestor 

## 2020-09-28 NOTE — Assessment & Plan Note (Signed)
No recent relapse. D/c ASA. Cont w/Plavix as above

## 2020-09-28 NOTE — Assessment & Plan Note (Signed)
Latrenda will do a COVID test at home.  If unsuccessful she will schedule COVID-19 test with her pharmacy.  Disposition-depending on the results.  In the meantime she will use over-the-counter cold medicines.

## 2020-09-28 NOTE — Assessment & Plan Note (Signed)
[name] [name] is in the vestibular PT.  Doing better.  We will continue

## 2020-09-28 NOTE — Assessment & Plan Note (Signed)
We discussed the implication of small vessel cerebrovascular disease with [name] [name].  I think it would be best if we continue with Plavix and discontinue aspirin.  She is in agreement.

## 2020-10-09 ENCOUNTER — Telehealth: Payer: Self-pay | Admitting: Adult Health

## 2020-10-09 NOTE — Telephone Encounter (Signed)
R/S per 8/29 pt request, r/s left message

## 2020-10-12 ENCOUNTER — Inpatient Hospital Stay: Payer: Medicare Other | Admitting: Adult Health

## 2020-10-12 ENCOUNTER — Inpatient Hospital Stay: Payer: Medicare Other

## 2020-10-12 ENCOUNTER — Other Ambulatory Visit: Payer: Self-pay

## 2020-10-12 ENCOUNTER — Inpatient Hospital Stay: Payer: Medicare Other | Attending: Adult Health | Admitting: Adult Health

## 2020-10-12 VITALS — BP 132/77 | HR 102 | Temp 97.4°F | Resp 18 | Ht 63.0 in | Wt 146.7 lb

## 2020-10-12 DIAGNOSIS — Z170 Estrogen receptor positive status [ER+]: Secondary | ICD-10-CM | POA: Diagnosis not present

## 2020-10-12 DIAGNOSIS — C50412 Malignant neoplasm of upper-outer quadrant of left female breast: Secondary | ICD-10-CM

## 2020-10-12 DIAGNOSIS — M818 Other osteoporosis without current pathological fracture: Secondary | ICD-10-CM

## 2020-10-12 DIAGNOSIS — M810 Age-related osteoporosis without current pathological fracture: Secondary | ICD-10-CM | POA: Diagnosis not present

## 2020-10-12 DIAGNOSIS — Z95828 Presence of other vascular implants and grafts: Secondary | ICD-10-CM

## 2020-10-12 LAB — BASIC METABOLIC PANEL - CANCER CENTER ONLY
Anion gap: 10 (ref 5–15)
BUN: 24 mg/dL — ABNORMAL HIGH (ref 8–23)
CO2: 24 mmol/L (ref 22–32)
Calcium: 10.5 mg/dL — ABNORMAL HIGH (ref 8.9–10.3)
Chloride: 109 mmol/L (ref 98–111)
Creatinine: 1.51 mg/dL — ABNORMAL HIGH (ref 0.44–1.00)
GFR, Estimated: 35 mL/min — ABNORMAL LOW (ref >60)
Glucose, Bld: 113 mg/dL — ABNORMAL HIGH (ref 70–99)
Potassium: 4.6 mmol/L (ref 3.5–5.1)
Sodium: 143 mmol/L (ref 135–145)

## 2020-10-12 MED ORDER — DENOSUMAB 60 MG/ML ~~LOC~~ SOSY
60.0000 mg | PREFILLED_SYRINGE | Freq: Once | SUBCUTANEOUS | Status: AC
  Administered 2020-10-12: 60 mg via SUBCUTANEOUS
  Filled 2020-10-12: qty 1

## 2020-10-12 NOTE — Progress Notes (Signed)
. [name] [name]   DOB: 31-Dec-1940  MR#: [id number]  CSN#:[id number]  Patient Care Team: [name], MD as PCP - General Magrinat, [name], MD as Consulting Physician (Oncology) [name], MD as Consulting Physician (General Surgery) [name], MD as Consulting Physician (Gastroenterology) [name], MD as Consulting Physician (Radiation Oncology)   CHIEF COMPLAINT: Weakly estrogen receptor positive breast cancer (s/p right mastectomy)  CURRENT TREATMENT: Anastrozole, Prolia   INTERVAL HISTORY: [name] [name] is here today for f/o of her estrogen positive breast cancer.  She continues taking anastrozole daily and is tolerating this well.  She also receives Prolia every 6 months.    [name] [name] missed her appointment in June and again in July.  In June she was exposed to [place] and in July she was in the hospital with ? TIA.  She says that the medical team is unclear what exactly happened, but she notes she had a brief vision change in her left eye where she could only see red and purple.  This has since resolved.  She has had no strength or residual deficits since that time.   [name] [name] last underwent her mammogram in June of this year.  It was normal.  Her most recent bone density testing was completed in 2019.  She has not yet had it repeated.  Overall she is feeling well and has no new breast concerns, or pain.  She is seeing her PCP regularly.    REVIEW OF SYSTEMS: Review of Systems  Constitutional:  Negative for appetite change, chills, fatigue, fever and unexpected weight change.  HENT:   Negative for hearing loss, lump/mass and trouble swallowing.   Eyes:  Negative for eye problems and icterus.  Respiratory:  Negative for chest tightness, cough and shortness of breath.   Cardiovascular:  Negative for chest pain, leg swelling and palpitations.  Gastrointestinal:  Negative for abdominal distention, abdominal pain, constipation, diarrhea, nausea and vomiting.  Endocrine:  Negative for hot flashes.  Genitourinary:  Negative for difficulty urinating.   Musculoskeletal:  Negative for arthralgias.  Skin:  Negative for itching and rash.  Neurological:  Negative for dizziness, extremity weakness, headaches and numbness.  Hematological:  Negative for adenopathy. Does not bruise/bleed easily.  Psychiatric/Behavioral:  Negative for depression. The patient is not nervous/anxious.     COVID 19 VACCINATION STATUS: fully vaccinated [employer]), with booster 12/25/2019   BREAST CANCER HISTORY: From the recent summary note:  I last saw [name] [name] in 2013 when she was released from follow-up from her right breast cancer recurrence in 2004. She is status post right mastectomy and adjuvant chemotherapy for that triple negative tumor. More recently, on 07/16/2016 she underwent left screening mammography at the [facility] on 07/16/2016. This found the breast density to be category B. There was a possible asymmetry in the left breast, and on 07/19/2016 she underwent left diagnostic mammography with tomography and left breast ultrasonography. This confirmed a well circumscribed oval mass at the 9:00 position of the left breast measuring approximately 0.8 cm. This was not palpable. On ultrasonography the mass measured 0.6 cm and it was located at the 9:00 radiant 4 cm from the nipple. The left axilla was sonographically benign.  On June 11 [name] [name] underwent biopsy of the left breast mass in question and this showed (SAA [id number]) and invasive ductal carcinoma, grade 3, estrogen receptor 5% positive with weak staining intensity, progesterone receptor negative, with an MIB-1 of 80%, and HER-2 nonamplified, the signals ratio being 1.46 and the number  per cell 1.90.  Her subsequent history is as detailed below.   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)   HTN (hypertension)    Hyperlipidemia    Hypothyroidism    Personal history of chemotherapy 2002   Personal history of radiation therapy 2002   Psoriasis    S/P thyroidectomy 07/2005   2 cm largest diameter (1 other tiny focus)/ i-131 rx 99 [facility]ci 08/2005   Thyroid cancer ([place])    Papillary Stage 1 - Dr Loanne Drilling   Vitamin B12 deficiency 2009   Vitamin D deficiency 2009    PAST SURGICAL HISTORY: Past Surgical History:  Procedure Laterality Date   BREAST LUMPECTOMY     BREAST LUMPECTOMY WITH RADIOACTIVE SEED AND SENTINEL LYMPH NODE BIOPSY Left 09/11/2016   Procedure: LEFT BREAST LUMPECTOMY WITH RADIOACTIVE SEED AND LEFT AXILLARY SENTINEL LYMPH NODE BIOPSY WITH BLUE DYE INJECTION;  Surgeon: [name], MD;  Location: [facility];  Service: General;  Laterality: Left;   IR FLUORO GUIDE PORT INSERTION RIGHT  09/13/2016   IR REMOVAL TUN ACCESS W/ PORT W/O FL MOD SED  12/30/2016   IR US GUIDE VASC ACCESS RIGHT  09/13/2016   MASTECTOMY Right    Right   PORTACATH PLACEMENT N/A 09/11/2016   Procedure: ATTEMPTED INSERTION PORT-A-CATH WITH ULTRA SOUND GUIDANCE;  Surgeon: [name], MD;  Location: [facility];  Service: General;  Laterality: N/A;   REDUCTION MAMMAPLASTY Left 2005   THYROIDECTOMY  2007    FAMILY HISTORY Family History  Problem Relation Age of Onset   Stroke Mother    Allergies Mother    Asthma Mother    Clotting disorder Mother    Heart disease Father 12       MI   Allergies Sister    Asthma Sister    Breast cancer Sister 76       Lymphoma 49; currently 47   Lymphoma Sister    Hyperparathyroidism Sister    Asthma Brother    Leukemia Brother        dx 55s   Allergies Daughter    Asthma Daughter    Allergies Sister    Breast cancer Sister 1       currently 31   Kidney cancer Paternal Aunt        kidney  ca; deceased 38   Liver cancer Paternal Uncle        unk. primary ("liver")   Throat cancer Maternal Grandmother        deceased 50   Lung cancer Maternal Grandfather        deceased 5   Pancreatic cancer Paternal Grandfather        deceased 91   Cancer Paternal Aunt        "abdominal"; deceased 53  Family history includes a sister, a paternal aunt, and a paternal cousin with breast cancer all older then 28 at diagnosis   GYN HISTORY: Menarche age 15, first live birth age 41. She is GX P3. She went through the change of life age 57. She did not take hormone replacement.   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 Her husband [name] is retired from [employer]. Her children are [name] [name], who works for time [employer] in [place], and [name] [name], who lives in [place] and works for [employer]. The third child, [name] [name], died in an automobile accident at age 80. The patient has one granddaughter. Her dog [name] [name], just passed away at 81 years old. 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    HEALTH MAINTENANCE: Social History   Tobacco Use   Smoking status: Never   Smokeless tobacco: Never  Substance Use Topics   Alcohol use: No   Drug use: No     Colonoscopy:  PAP:  Bone density: 05/06/2017 showed a T score of -3.4 osteoporosis  Lipid panel:  Allergies  Allergen Reactions   Pneumococcal Vaccines Other (See Comments)    Was really sick    Sulfa Antibiotics Other (See Comments)    Pt believes it was hives   Sulfacetamide Sodium-Sulfur    Tape Itching, Dermatitis and Rash   Tegaderm Ag Mesh [Silver] Itching and Rash    Current Outpatient Medications  Medication Sig Dispense Refill   ALPRAZolam (XANAX) 1 MG tablet TAKE 0.5-1 TABLETS (0.5-1 MG TOTAL) BY MOUTH 2 (TWO) TIMES DAILY AS NEEDED FOR ANXIETY. 60  tablet 1   anastrozole (ARIMIDEX) 1 MG tablet TAKE 1 TABLET BY MOUTH EVERY DAY 90 tablet 4   cholecalciferol (VITAMIN D) 1000 units tablet Take 2 tablets (2,000 Units total) by mouth daily. 100 tablet 3   clopidogrel (PLAVIX) 75 MG tablet Take 1 tablet (75 mg total) by mouth daily. 90 tablet 3   levothyroxine (SYNTHROID) 112 MCG tablet TAKE 1 TABLET (112 MCG TOTAL) BY MOUTH DAILY. 90 tablet 2   meclizine (ANTIVERT) 25 MG tablet Take 1 tablet (25 mg total) by mouth 3 (three) times daily as needed for dizziness. 30 tablet 0   rosuvastatin (CRESTOR) 20 MG tablet Take 1 tablet (20 mg total) by mouth daily. 30 tablet 1   telmisartan (MICARDIS) 40 MG tablet TAKE 1 TABLET BY MOUTH EVERY DAY 90 tablet 0   venlafaxine XR (EFFEXOR XR) 37.5 MG 24 hr capsule Take 1 capsule (37.5 mg total) by mouth daily with breakfast. 90 capsule 3   vitamin B-12 (CYANOCOBALAMIN) 1000 MCG tablet Take 1,000 mcg by mouth daily.     No current facility-administered medications for this visit.    OBJECTIVE:   Vitals:   10/12/20 1353  BP: 132/77  Pulse: (!) 102  Resp: 18  Temp: (!) 97.4 F (36.3 C)  SpO2: 95%      Body mass index is 25.99 kg/m.    ECOG FS:     LAB RESULTS: Lab Results  Component Value Date   WBC 4.4 08/21/2020   NEUTROABS 1.7 08/21/2020   HGB 12.2 08/21/2020   HCT 36.0 08/21/2020   MCV 90.4 08/21/2020   PLT 249 08/21/2020      Chemistry      Component Value Date/Time   NA 139 08/21/2020 1326   NA 139 12/11/2016 1017   K 3.8 08/21/2020 1326   K 3.7 12/11/2016 1017   CL 104 08/21/2020 1326   CL 104 10/09/2011 1403   CO2 24 08/21/2020 1320   CO2 22 12/11/2016 1017   BUN 15 08/21/2020 1326   BUN 16.9 12/11/2016 1017   CREATININE 1.00 08/21/2020 1326   CREATININE 1.74 (H) 10/20/2019 1634  CREATININE 1.2 (H) 12/11/2016 1017      Component Value Date/Time   CALCIUM 9.9 08/21/2020 1320   CALCIUM 9.0 12/11/2016 1017   ALKPHOS 61 08/21/2020 1320   ALKPHOS 139 12/11/2016 1017    AST 27 08/21/2020 1320   AST 65 (H) 12/11/2016 1017   ALT 16 08/21/2020 1320   ALT 51 12/11/2016 1017   BILITOT 0.7 08/21/2020 1320   BILITOT 0.42 12/11/2016 1017       Lab Results  Component Value Date   LABCA2 16 10/09/2011    No components found for: [id number]  No results for input(s): INR in the last 168 hours.  Urinalysis    Component Value Date/Time   COLORURINE YELLOW 08/09/2020 1543   APPEARANCEUR CLEAR 08/09/2020 1543   LABSPEC 1.015 08/09/2020 1543   PHURINE 6.0 08/09/2020 1543   GLUCOSEU 50 (A) 08/09/2020 1543   GLUCOSEU NEGATIVE 06/05/2015 1559   HGBUR NEGATIVE 08/09/2020 1543   BILIRUBINUR NEGATIVE 08/09/2020 [place] 08/09/2020 1543   PROTEINUR NEGATIVE 08/09/2020 1543   UROBILINOGEN 0.2 06/05/2015 1559   NITRITE NEGATIVE 08/09/2020 1543   LEUKOCYTESUR SMALL (A) 08/09/2020 1543    STUDIES:   CLINICAL DATA:  Screening. History of left lumpectomy 2018 and remote right mastectomy.   EXAM: DIGITAL SCREENING UNILATERAL LEFT MAMMOGRAM WITH CAD AND TOMOSYNTHESIS   TECHNIQUE: Left screening digital craniocaudal and mediolateral oblique mammograms were obtained. Left screening digital breast tomosynthesis was performed. The images were evaluated with computer-aided detection.   COMPARISON:  Previous exam(s).   ACR Breast Density Category b: There are scattered areas of fibroglandular density.   FINDINGS: There are no findings suspicious for malignancy.   IMPRESSION: No mammographic evidence of malignancy. A result letter of this screening mammogram will be mailed directly to the patient.   RECOMMENDATION: Screening mammogram in one year. (Code:[id number]-01Y)   BI-RADS CATEGORY  1: Negative.     Electronically Signed   By: Everlean Alstrom M.D.   On: 08/09/2020 15:21  ASSESSMENT: 80 y.o. [place] woman   (1) status post right lumpectomy and sentinel lymph node biopsy in September 2002 for multifocal breast carcinoma, triple  negative.  Treated with CMF followed by radiation.   (2) Local recurrence in April 2004, status post right modified radical mastectomy with TRAM reconstruction for what proved to be a T1c N0, stage IA triple negative breast carcinoma.  Treated adjuvantly with paclitaxel and doxorubicin x4 in 2004.  Off treatment since August 2004 with no evidence of recurrence  (3) history of papillary thyroid cancer s/p thyroidectomy June 2007, s/p radioactive iodine July 2007  (4) status post left breast upper outer quadrant biopsy 08/21/2016 for a clinical T1b N0, stage IB invasive ductal carcinoma, grade 3, weakly estrogen receptor positive, progesterone receptor and HER-2 negative, with an MIB-1 of 80%.  (5) left lumpectomy and sentinel lymph node sampling 09/11/2016 found a pT1b pN0, stage IB invasive ductal carcinoma, grade 3, with negative margins.  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.Gln90*) (a) there were no  deleterious mutations in ALK, APC, ATM, AXIN2, BAP1, BARD1, BLM, BMPR1A, BRCA1, BRCA2, BRIP1, CASR, CDC73, CDH1, CDK4, CDKN1B, CDKN1C, CDKN2A, CEBPA, CHEK2, CTNNA1, DICER1, DIS3L2, EGFR, EPCAM, FH, FLCN, GATA2, GPC3, GREM1, HOXB13, HRAS, KIT, MAX, MEN1, MET, MITF, MLH1, MSH2, MSH3, MSH6, MUTYH, NBN, NF1, NF2, NTHL1, PALB2, PDGFRA, PHOX2B, PMS2, POLD1, POLE, POT1, PRKAR1A, PTCH1, PTEN, RAD50, RAD51C, RAD51D, RB1, RECQL4, RET, RUNX1, SDHA, SDHAF2, SDHB, SDHC, SDHD, SMAD4, SMARCA4, SMARCB1, SMARCE1, STK11, SUFU, TERC, TERT, TMEM127, TP53,  TSC1, TSC2, VHL, WRN, WT1 (b) while homozygous mutations in the NPH L1 gene are associated with autosomal recessive polyposis, there is no evidence to suggest that a single mutation in this gene increases cancer risk.  (9) started anastrozole 03/14/2017 (a) bone density on 05/06/2017 showed a T score of -3.4--osteoporosis (b) started denosumab/Prolia June 2019, to be repeated every 6 months   PLAN:  Kateleen is here today for f/u of her estrogen positive breast cancer.  She has no clinical or radiographic sign of breast cancer recurrence.  She will continue anastrozole daily as she is tolerating this well.  She will complete this in 03/2022.    Belladonna is also receiving Prolia every 6 months.  She is slightly delayed in receiving this due to the events noted above.  She is also overdue for bone density testing. I placed orders for her to undergo this in the next 1-3  months.    [place] was recommended to continue seeing her PCP regularly and to continue with healthy diet and exercise.  We will see her back in 6 months.    I reviewed with [facility] that Dr. Jana Hakim is retiring.  She would like to remain with seeing me in her follow-up, and should anything arise, she will see Dr. Lindi Adie.  Aqsa vebalized understanding.    She knows to call for any questions that may arise between now and her next appointment.  We are happy to see her sooner if needed.  Total encounter time: 30 minutes* in face to face visit time, chart review, order entry, and documentation of the encounter.     Wilber Bihari, NP 10/12/20 2:05 PM Medical Oncology and Hematology [facility] [name], [place] [address] Tel. [phone]    Fax. [phone]  *Total Encounter Time as defined by the Centers for Medicare and Medicaid Services includes, in addition to the face-to-face time of a patient visit (documented in the note above) non-face-to-face time: obtaining and reviewing outside history, ordering  and reviewing medications, tests or procedures, care coordination (communications with other health care professionals or caregivers) and documentation in the medical record.

## 2020-10-12 NOTE — Patient Instructions (Signed)
Denosumab injection What is this medication? DENOSUMAB (den oh sue mab) slows bone breakdown. Prolia is used to treat osteoporosis in women after menopause and in men, and in people who are taking corticosteroids for 6 months or more. Delton See is used to treat a high calcium level due to cancer and to prevent bone fractures and other bone problems caused by multiple myeloma or cancer bone metastases. Delton See is also used to treat giant cell tumor of the bone. This medicine may be used for other purposes; ask your health care provider or pharmacist if you have questions. COMMON BRAND NAME(S): Prolia, XGEVA What should I tell my care team before I take this medication? They need to know if you have any of these conditions: dental disease having surgery or tooth extraction infection kidney disease low levels of calcium or Vitamin D in the blood malnutrition on hemodialysis skin conditions or sensitivity thyroid or parathyroid disease an unusual reaction to denosumab, other medicines, foods, dyes, or preservatives pregnant or trying to get pregnant breast-feeding How should I use this medication? This medicine is for injection under the skin. It is given by a health care professional in a hospital or clinic setting. A special MedGuide will be given to you before each treatment. Be sure to read this information carefully each time. For Prolia, talk to your pediatrician regarding the use of this medicine in children. Special care may be needed. For Delton See, talk to your pediatrician regarding the use of this medicine in children. While this drug may be prescribed for children as young as 13 years for selected conditions, precautions do apply. Overdosage: If you think you have taken too much of this medicine contact a poison control center or emergency room at once. NOTE: This medicine is only for you. Do not share this medicine with others. What if I miss a dose? It is important not to miss your dose.  Call your doctor or health care professional if you are unable to keep an appointment. What may interact with this medication? Do not take this medicine with any of the following medications: other medicines containing denosumab This medicine may also interact with the following medications: medicines that lower your chance of fighting infection steroid medicines like prednisone or cortisone This list may not describe all possible interactions. Give your health care provider a list of all the medicines, herbs, non-prescription drugs, or dietary supplements you use. Also tell them if you smoke, drink alcohol, or use illegal drugs. Some items may interact with your medicine. What should I watch for while using this medication? Visit your doctor or health care professional for regular checks on your progress. Your doctor or health care professional may order blood tests and other tests to see how you are doing. Call your doctor or health care professional for advice if you get a fever, chills or sore throat, or other symptoms of a cold or flu. Do not treat yourself. This drug may decrease your body's ability to fight infection. Try to avoid being around people who are sick. You should make sure you get enough calcium and vitamin D while you are taking this medicine, unless your doctor tells you not to. Discuss the foods you eat and the vitamins you take with your health care professional. See your dentist regularly. Brush and floss your teeth as directed. Before you have any dental work done, tell your dentist you are receiving this medicine. Do not become pregnant while taking this medicine or for 5 months after  stopping it. Talk with your doctor or health care professional about your birth control options while taking this medicine. Women should inform their doctor if they wish to become pregnant or think they might be pregnant. There is a potential for serious side effects to an unborn child. Talk to  your health care professional or pharmacist for more information. What side effects may I notice from receiving this medication? Side effects that you should report to your doctor or health care professional as soon as possible: allergic reactions like skin rash, itching or hives, swelling of the face, lips, or tongue bone pain breathing problems dizziness jaw pain, especially after dental work redness, blistering, peeling of the skin signs and symptoms of infection like fever or chills; cough; sore throat; pain or trouble passing urine signs of low calcium like fast heartbeat, muscle cramps or muscle pain; pain, tingling, numbness in the hands or feet; seizures unusual bleeding or bruising unusually weak or tired Side effects that usually do not require medical attention (report to your doctor or health care professional if they continue or are bothersome): constipation diarrhea headache joint pain loss of appetite muscle pain runny nose tiredness upset stomach This list may not describe all possible side effects. Call your doctor for medical advice about side effects. You may report side effects to FDA at 1-800-FDA-1088. Where should I keep my medication? This medicine is only given in a clinic, doctor's office, or other health care setting and will not be stored at home. NOTE: This sheet is a summary. It may not cover all possible information. If you have questions about this medicine, talk to your doctor, pharmacist, or health care provider.  2022 Elsevier/Gold Standard (2017-06-06 16:10:44)

## 2020-10-13 ENCOUNTER — Telehealth: Payer: Self-pay | Admitting: Adult Health

## 2020-10-13 ENCOUNTER — Encounter: Payer: Self-pay | Admitting: Oncology

## 2020-10-13 ENCOUNTER — Ambulatory Visit: Payer: Medicare Other | Admitting: Endocrinology

## 2020-10-13 ENCOUNTER — Encounter: Payer: Self-pay | Admitting: Adult Health

## 2020-10-13 NOTE — Telephone Encounter (Signed)
Scheduled per 9/1 los. Called and spoke with pt confirmed 3/2 appt

## 2020-10-17 ENCOUNTER — Other Ambulatory Visit: Payer: Self-pay

## 2020-10-17 ENCOUNTER — Encounter: Payer: Self-pay | Admitting: Physical Therapy

## 2020-10-17 ENCOUNTER — Ambulatory Visit: Payer: Medicare Other | Attending: Internal Medicine | Admitting: Physical Therapy

## 2020-10-17 VITALS — BP 99/73 | HR 105

## 2020-10-17 DIAGNOSIS — R2681 Unsteadiness on feet: Secondary | ICD-10-CM | POA: Insufficient documentation

## 2020-10-17 DIAGNOSIS — R42 Dizziness and giddiness: Secondary | ICD-10-CM | POA: Insufficient documentation

## 2020-10-18 NOTE — Therapy (Signed)
[facility] [address] [place], [place], [address] Phone: [phone]   Fax:  [phone]  Physical Therapy Treatment  Patient Details  Name: [name] [name] MRN: [id number] Date of Birth: 13-Apr-1940 Referring Provider (PT): Dr. Lew Dawes   Encounter Date: 10/17/2020   PT End of Session - 10/18/20 1749     Visit Number 3    Number of Visits 7    Date for PT Re-Evaluation 11/17/20    Authorization Type BCBS Medicare    Authorization Time Period 09-12-20 - 10-13-20; 10-17-20 - 11-17-20    PT Start Time 1105    PT Stop Time 1152    PT Time Calculation (min) 47 min    Activity Tolerance Patient tolerated treatment well    Behavior During Therapy [facility] for tasks assessed/performed             Past Medical History:  Diagnosis Date   Allergic rhinitis    Anxiety    Breast cancer ([place]) hx 2004   recurrent 2006 DR Magrinat   Family history of breast cancer    Genetic testing 12/05/2016   Multi-Cancer panel (83 genes) @ [facility] - Monoallelic mutation in [place] (carrier)   HTN (hypertension)    Hyperlipidemia    Hypothyroidism    Personal history of chemotherapy 2002   Personal history of radiation therapy 2002   Psoriasis    S/P thyroidectomy 07/2005   2 cm largest diameter (1 other tiny focus)/ i-131 rx 99 mci 08/2005   Thyroid cancer ([place])    Papillary Stage 1 - Dr Loanne Drilling   Vitamin B12 deficiency 2009   Vitamin D deficiency 2009    Past Surgical History:  Procedure Laterality Date   BREAST LUMPECTOMY     BREAST LUMPECTOMY WITH RADIOACTIVE SEED AND SENTINEL LYMPH NODE BIOPSY Left 09/11/2016   Procedure: LEFT BREAST LUMPECTOMY WITH RADIOACTIVE SEED AND LEFT AXILLARY SENTINEL LYMPH NODE BIOPSY WITH BLUE DYE INJECTION;  Surgeon: [name], MD;  Location: [place];  Service: General;  Laterality: Left;   IR FLUORO GUIDE PORT INSERTION RIGHT  09/13/2016   IR REMOVAL TUN ACCESS W/ PORT W/O  FL MOD SED  12/30/2016   IR US GUIDE VASC ACCESS RIGHT  09/13/2016   MASTECTOMY Right    Right   PORTACATH PLACEMENT N/A 09/11/2016   Procedure: ATTEMPTED INSERTION PORT-A-CATH WITH ULTRA SOUND GUIDANCE;  Surgeon: [name], MD;  Location: [facility];  Service: General;  Laterality: N/A;   REDUCTION MAMMAPLASTY Left 2005   THYROIDECTOMY  2007    Vitals:   10/17/20 1128  BP: 99/73  Pulse: (!) 105     Subjective Assessment - 10/17/20 1108     Subjective Pt reports overall the dizziness is much better and states she has not had the room spinning vertigo but still has some dizziness at times - especially with change in position: Pt reports "I woke up before I got up and just didn't just get up and go" - states she has gotten used to sitting on side of bed for few minutes before she stands up and walks so she is not dizzy when she stands.  Pt reports her balance is not very good at times.    Patient is accompained by: Family member   husband waiting in lobby   Pertinent History TIA on 08-09-20    Patient Stated Goals resolve the vertigo    Currently in Pain? No/denies  Vestibular Assessment - 10/18/20 0001       Positional Testing   Dix-Hallpike Dix-Hallpike Right;Dix-Hallpike Left    Sidelying Test Sidelying Right;Sidelying Left    Horizontal Canal Testing Horizontal Canal Right;Horizontal Canal Left      Dix-Hallpike Right   Dix-Hallpike Right Duration none    Dix-Hallpike Right Symptoms No nystagmus      Dix-Hallpike Left   Dix-Hallpike Left Duration none    Dix-Hallpike Left Symptoms No nystagmus      Sidelying Right   Sidelying Right Symptoms No nystagmus      Sidelying Left   Sidelying Left Duration none    Sidelying Left Symptoms No nystagmus      Horizontal Canal Right   Horizontal Canal Right Duration none    Horizontal Canal Right Symptoms Normal      Horizontal Canal Left   Horizontal Canal Left Duration  none    Horizontal Canal Left Symptoms Normal      Positional Sensitivities   Up from Right Hallpike Moderate dizziness    Up from Left Hallpike Moderate dizziness                           Balance Exercises - 10/18/20 0001       Balance Exercises: Standing   Standing Eyes Opened Narrow base of support (BOS);Wide (BOA);Foam/compliant surface;1 rep   30 sec hold   Standing Eyes Closed Wide (BOA);Foam/compliant surface;1 rep   10 sec hold - pt had LOB               PT Education - 10/18/20 1748     Education Details pt instructed to record BP's at home and take with her to next MD appt - she has low BP reading at today's appt.    Person(s) Educated Patient    Methods Explanation    Comprehension Verbalized understanding              PT Short Term Goals - 09/13/20 1133       PT SHORT TERM GOAL #1   Title same as LTG's               PT Long Term Goals - 10/18/20 1758       PT LONG TERM GOAL #1   Title Pt will have (-) Lt Dix-Hallpike test with no nystamus and no c/o vertigo in test position and no c/o dizziness with return to upright to indicate resolution of Lt BPPV.    Baseline met 10-17-20    Time 4    Period Weeks    Status Achieved      PT LONG TERM GOAL #2   Title Improve FOTO DPS score from 50 to >/= 55/100 to indicate improvement in dizziness.    Baseline 50/100 DPS    Time 4    Period Weeks    Status On-going    Target Date 10/17/20      PT LONG TERM GOAL #3   Title Pt will amb. 61' without device with intermittent horizontal head turns without LOB independently.    Time 4    Period Weeks    Status On-going    Target Date 11/17/20      PT LONG TERM GOAL #4   Title Independent in [place] Daroff exercises and also in Epley maneuver for self treatment prn pending reoccurrence of BPPV.    Baseline met 10-17-20    Time 4    Period  Weeks    Status Achieved      PT LONG TERM GOAL #5   Title Pt will improve DGI score by at  least 4 points to demo improved balance and safety with ambulation.    Time 4    Period Weeks    Status New    Target Date 11/17/20      Additional Long Term Goals   Additional Long Term Goals Yes      PT LONG TERM GOAL #6   Title Independent in updated HEP including balance exercises.    Time 4    Period Weeks    Status New    Target Date 11/17/20                   Plan - 10/17/20 1112     Clinical Impression Statement Pt has met LTG's #1 & 4:  LTG's #2 & 3 are ongoing.  Pt returns to PT after not being seen for 1 month due to pt having been exposed to Covid.  Pt presents with no signs of BPPV at this time - both Rt and Lt Dix-Hallpike tests negative with no nystagmus noted and no c/o vertigo in test positions.  Pt does c/o dizziness with return to upright position from both Rt and Lt Dix-Hallpike test positions, which may be related to change in BP.  Pt demonstrates vestibular hypofunction as she is unable to maintain balance on foam with EC.  Pt also reports she has tinnitus in her Rt ear.  Pt will benefit from additional PT to address balance and vestibular deficits - balance testing was not completed due to time constraints in today's session.    Personal Factors and Comorbidities Behavior Pattern;Comorbidity 2;Age    Comorbidities anxiety/depression, HTN, h/o TIA on 08-09-20, h/o thyroid cancer, tachycardia, h/o Lt breast cancer, h/o chronic episodic vertigo    Examination-Activity Limitations Bend;Locomotion Level;Transfers;Bed Mobility;Reach Overhead;Stairs;Stand;Squat    Examination-Participation Restrictions Cleaning;Meal Prep;Driving;Community Activity;Shop    Stability/Clinical Decision Making Evolving/Moderate complexity    Rehab Potential Good    PT Frequency 1x / week    PT Duration 4 weeks    PT Treatment/Interventions ADLs/Self Care Home Management;Canalith Repostioning;Vestibular;Patient/family education;Therapeutic exercise;Therapeutic activities;Balance  training;Neuromuscular re-education;Gait training    PT Next Visit Plan do DGI; issue balance HEP - balance on foam?    PT Home Exercise Plan Pt was issued Brandt-Daroff exs by MD    Consulted and Agree with Plan of Care Patient             Patient will benefit from skilled therapeutic intervention in order to improve the following deficits and impairments:  Dizziness, Decreased balance, Difficulty walking  Visit Diagnosis: Dizziness and giddiness - Plan: PT plan of care cert/re-cert  Unsteadiness on feet - Plan: PT plan of care cert/re-cert     Problem List Patient Active Problem List   Diagnosis Date Noted   Upper respiratory infection 09/28/2020   Retinal artery occlusion, branch, right 08/09/2020   History of TIA (transient ischemic attack) 08/09/2020   Hyperparathyroidism ([place]) 06/13/2020   Skin lesion 03/21/2020   Cerebrovascular disease 03/21/2020   Pruritus 03/14/2020   Ataxia 07/20/2019   Seroma of breast 07/20/2019   Cerumen impaction 03/23/2019   Wrist pain 03/23/2019   Right ear pain 06/27/2018   Coronary artery disease 01/20/2018   Breast cyst, left 09/17/2017   Aortic atherosclerosis ([place]) 07/28/2017   Osteoporosis 05/07/2017   Genetic testing 12/05/2016   Family history of breast cancer  Port catheter in place 10/23/2016   Encounter for antineoplastic chemotherapy 10/23/2016   Malignant neoplasm of upper-outer quadrant of left breast in female, estrogen receptor positive ([place]) 07/29/2016   Well adult exam 06/05/2015   Neoplasm of uncertain behavior of skin 06/05/2015   Adenopathy, cervical 06/05/2015   Tachycardia 06/07/2014   Wart viral 10/18/2011   Hyperglycemia 11/06/2010   Actinic keratoses 05/25/2010   Essential hypertension 02/23/2010   SHOULDER PAIN 11/22/2009   Chronic fatigue 11/08/2009   RLQ PAIN 11/08/2009   VERTIGO 07/11/2009   ECZEMA 10/19/2008   CT, CHEST, ABNORMAL 05/30/2008   STYE 02/17/2008   B12 deficiency 12/16/2007    Vitamin D deficiency 12/16/2007   HYPOTHYROIDISM, POSTSURGICAL 12/01/2007   Acute maxillary sinusitis 11/11/2007   ALLERGIC RESPIRATORY DISEASE, EXTRINSIC 11/11/2007   THYROID CANCER 08/19/2007   Dyslipidemia 08/19/2007   Anxiety disorder 08/19/2007   Situational depression 08/19/2007   Allergic rhinitis 08/19/2007    Bascom Biel, [place], PT 10/18/2020, 6:09 PM  [facility] [address] [place], [place], [address] Phone: [phone]   Fax:  [phone]  Name: [name] [name] MRN: [id number] Date of Birth: 15-Jun-1940

## 2020-10-24 ENCOUNTER — Other Ambulatory Visit: Payer: Self-pay | Admitting: Internal Medicine

## 2020-10-30 ENCOUNTER — Ambulatory Visit: Payer: Medicare Other | Admitting: Physical Therapy

## 2020-11-03 ENCOUNTER — Other Ambulatory Visit: Payer: Self-pay | Admitting: Internal Medicine

## 2020-11-09 ENCOUNTER — Ambulatory Visit: Payer: Medicare Other | Admitting: Physical Therapy

## 2020-11-13 ENCOUNTER — Other Ambulatory Visit: Payer: Self-pay | Admitting: Oncology

## 2020-11-14 ENCOUNTER — Other Ambulatory Visit: Payer: Self-pay | Admitting: *Deleted

## 2020-11-14 ENCOUNTER — Ambulatory Visit: Payer: Medicare Other | Attending: Internal Medicine | Admitting: Physical Therapy

## 2020-11-14 ENCOUNTER — Other Ambulatory Visit: Payer: Self-pay

## 2020-11-14 DIAGNOSIS — H8112 Benign paroxysmal vertigo, left ear: Secondary | ICD-10-CM | POA: Diagnosis not present

## 2020-11-14 DIAGNOSIS — R2681 Unsteadiness on feet: Secondary | ICD-10-CM | POA: Insufficient documentation

## 2020-11-14 NOTE — Patient Instructions (Signed)
HIP: Flexion / KNEE: Extension, Heel Strike - Standing - alternate legs for increased challenge    Reach leg out to take step, knee straight. Land with heel on floor, toes up. _10__ reps per set, __1_ sets per day, _5__ days per week Hold onto a support.     ALSO DO BACKWARD LIFTS AND SIDE LIFTS - 10 REPS EACH LEG - ALTERNATE      Marching In-Place- HOLD TO COUNTER AS NEEDED    Standing straight, alternate bringing knees toward trunk. Arms swing alternately. DO 10 times each leg. Hold as needed for safety  Copyright  VHI. All rights reserved.     SINGLE LIMB STANCE - DO STANDING AT COUNTER AND HOLD AS NEEDED - 10 SEC COUNT- ON EACH LEG    Stance: single leg on floor. Raise leg. Hold _10__ seconds. Repeat with other leg. _2__ reps per set, __1_ sets per day, __5_ days per week  Copyright  VHI. All rights reserved.

## 2020-11-15 ENCOUNTER — Encounter: Payer: Self-pay | Admitting: Physical Therapy

## 2020-11-15 NOTE — Therapy (Signed)
[facility] [address] [place], [place], [address] Phone: [phone]   Fax:  [phone]  Physical Therapy Treatment  Patient Details  Name: [name] [name] MRN: [id number] Date of Birth: 1941/02/07 Referring Provider (PT): Dr. Lew Dawes   Encounter Date: 11/14/2020   PT End of Session - 11/15/20 1124     Visit Number 4    Number of Visits 7    Date for PT Re-Evaluation 11/17/20    Authorization Type BCBS Medicare    Authorization Time Period 09-12-20 - 10-13-20; 10-17-20 - 11-17-20    PT Start Time 1447    PT Stop Time 2831    PT Time Calculation (min) 43 min    Activity Tolerance Patient tolerated treatment well    Behavior During Therapy [facility] for tasks assessed/performed             Past Medical History:  Diagnosis Date   Allergic rhinitis    Anxiety    Breast cancer ([place]) hx 2004   recurrent 2006 DR Magrinat   Family history of breast cancer    Genetic testing 12/05/2016   Multi-Cancer panel (83 genes) @ [facility] - Monoallelic mutation in [place] (carrier)   HTN (hypertension)    Hyperlipidemia    Hypothyroidism    Personal history of chemotherapy 2002   Personal history of radiation therapy 2002   Psoriasis    S/P thyroidectomy 07/2005   2 cm largest diameter (1 other tiny focus)/ i-131 rx 99 mci 08/2005   Thyroid cancer ([place])    Papillary Stage 1 - Dr Loanne Drilling   Vitamin B12 deficiency 2009   Vitamin D deficiency 2009    Past Surgical History:  Procedure Laterality Date   BREAST LUMPECTOMY     BREAST LUMPECTOMY WITH RADIOACTIVE SEED AND SENTINEL LYMPH NODE BIOPSY Left 09/11/2016   Procedure: LEFT BREAST LUMPECTOMY WITH RADIOACTIVE SEED AND LEFT AXILLARY SENTINEL LYMPH NODE BIOPSY WITH BLUE DYE INJECTION;  Surgeon: [name], MD;  Location: [facility];  Service: General;  Laterality: Left;   IR FLUORO GUIDE PORT INSERTION RIGHT  09/13/2016   IR REMOVAL TUN ACCESS W/ PORT  W/O FL MOD SED  12/30/2016   IR US GUIDE VASC ACCESS RIGHT  09/13/2016   MASTECTOMY Right    Right   PORTACATH PLACEMENT N/A 09/11/2016   Procedure: ATTEMPTED INSERTION PORT-A-CATH WITH ULTRA SOUND GUIDANCE;  Surgeon: [name], MD;  Location: [facility];  Service: General;  Laterality: N/A;   REDUCTION MAMMAPLASTY Left 2005   THYROIDECTOMY  2007    There were no vitals filed for this visit.   Subjective Assessment - 11/14/20 1452     Subjective Pt states the dizziness is much better; states "now I want to work on balance"    Patient is accompained by: Family member   husband waiting in lobby   Pertinent History TIA on 08-09-20    Patient Stated Goals resolve the vertigo    Currently in Pain? No/denies                                    Balance Exercises - 11/15/20 0001       Balance Exercises: Standing   Standing Eyes Opened Narrow base of support (BOS);Wide (BOA);Head turns;Foam/compliant surface;5 reps   horizontal and vertical head turns   Standing Eyes Closed Narrow base of support (BOS);Wide (BOA);Head turns;Foam/compliant surface;5  reps   horizontal and vertical head turns   SLS Eyes open;Solid surface;Intermittent upper extremity support;2 reps   10 sec hold - each foot 1 rep   Marching Foam/compliant surface;Solid surface;Static;10 reps   on floor & on pillows in corner   Other Standing Exercises partial tandem stance on floor 15 sec hold with UE support prn    Other Standing Exercises Comments Pt performed forward, back & side kicks 10 reps each for HEP instruction with UE support at counter prn            Lt Dix-Hallpike test (-) with no nystagmus and no c/o vertigo in test position - indicative of  resolution of Lt BPPV    PT Education - 11/15/20 1122     Education Details Balance HEP issued - 4 positions balance on foam - EO & EC in corner; also see pt instructions    Person(s) Educated Patient    Methods  Explanation;Demonstration;Handout    Comprehension Verbalized understanding;Returned demonstration              PT Short Term Goals - 09/13/20 1133       PT SHORT TERM GOAL #1   Title same as LTG's               PT Long Term Goals - 11/15/20 1131       PT LONG TERM GOAL #1   Title Pt will have (-) Lt Dix-Hallpike test with no nystamus and no c/o vertigo in test position and no c/o dizziness with return to upright to indicate resolution of Lt BPPV.    Baseline met 10-17-20    Time 4    Period Weeks    Status Achieved      PT LONG TERM GOAL #2   Title Improve FOTO DPS score from 50 to >/= 55/100 to indicate improvement in dizziness.    Baseline 50/100 DPS    Time 4    Period Weeks    Status On-going      PT LONG TERM GOAL #3   Title Pt will amb. 64' without device with intermittent horizontal head turns without LOB independently.    Time 4    Period Weeks    Status On-going      PT LONG TERM GOAL #4   Title Independent in [place] Daroff exercises and also in Epley maneuver for self treatment prn pending reoccurrence of BPPV.    Baseline met 10-17-20    Time 4    Period Weeks    Status Achieved      PT LONG TERM GOAL #5   Title Pt will improve DGI score by at least 4 points to demo improved balance and safety with ambulation.    Time 4    Period Weeks    Status New      PT LONG TERM GOAL #6   Title Independent in updated HEP including balance exercises.    Time 4    Period Weeks    Status New                   Plan - 11/14/20 1450     Clinical Impression Statement Pt able to maintain balance on compliant surface in today's session - improved from performance in previous session 4 weeks ago.  Pt has (-) Lt Dix-Hallpike test with no nystagmus and no c/o vertigo, indicative of resolution of Lt BPPV.  Balance HEP issued per pt's request, but pt demonstrated good  balance on compliant & non-compliant surfaces in today's session.  Plan D/C next session.     Personal Factors and Comorbidities Behavior Pattern;Comorbidity 2;Age    Comorbidities anxiety/depression, HTN, h/o TIA on 08-09-20, h/o thyroid cancer, tachycardia, h/o Lt breast cancer, h/o chronic episodic vertigo    Examination-Activity Limitations Bend;Locomotion Level;Transfers;Bed Mobility;Reach Overhead;Stairs;Stand;Squat    Examination-Participation Restrictions Cleaning;Meal Prep;Driving;Community Activity;Shop    Stability/Clinical Decision Making Evolving/Moderate complexity    Rehab Potential Good    PT Frequency 1x / week    PT Duration 4 weeks    PT Treatment/Interventions ADLs/Self Care Home Management;Canalith Repostioning;Vestibular;Patient/family education;Therapeutic exercise;Therapeutic activities;Balance training;Neuromuscular re-education;Gait training    PT Next Visit Plan D/C next session -    PT Home Exercise Plan Pt was issued Brandt-Daroff exs by MD; balance on foam added on 11-14-20    Consulted and Agree with Plan of Care Patient             Patient will benefit from skilled therapeutic intervention in order to improve the following deficits and impairments:  Dizziness, Decreased balance, Difficulty walking  Visit Diagnosis: BPPV (benign paroxysmal positional vertigo), left  Unsteadiness on feet     Problem List Patient Active Problem List   Diagnosis Date Noted   Upper respiratory infection 09/28/2020   Retinal artery occlusion, branch, right 08/09/2020   History of TIA (transient ischemic attack) 08/09/2020   Hyperparathyroidism ([place]) 06/13/2020   Skin lesion 03/21/2020   Cerebrovascular disease 03/21/2020   Pruritus 03/14/2020   Ataxia 07/20/2019   Seroma of breast 07/20/2019   Cerumen impaction 03/23/2019   Wrist pain 03/23/2019   Right ear pain 06/27/2018   Coronary artery disease 01/20/2018   Breast cyst, left 09/17/2017   Aortic atherosclerosis ([place]) 07/28/2017   Osteoporosis 05/07/2017   Genetic testing 12/05/2016   Family  history of breast cancer    Port catheter in place 10/23/2016   Encounter for antineoplastic chemotherapy 10/23/2016   Malignant neoplasm of upper-outer quadrant of left breast in female, estrogen receptor positive ([place]) 07/29/2016   Well adult exam 06/05/2015   Neoplasm of uncertain behavior of skin 06/05/2015   Adenopathy, cervical 06/05/2015   Tachycardia 06/07/2014   Wart viral 10/18/2011   Hyperglycemia 11/06/2010   Actinic keratoses 05/25/2010   Essential hypertension 02/23/2010   SHOULDER PAIN 11/22/2009   Chronic fatigue 11/08/2009   RLQ PAIN 11/08/2009   VERTIGO 07/11/2009   ECZEMA 10/19/2008   CT, CHEST, ABNORMAL 05/30/2008   STYE 02/17/2008   B12 deficiency 12/16/2007   Vitamin D deficiency 12/16/2007   HYPOTHYROIDISM, POSTSURGICAL 12/01/2007   Acute maxillary sinusitis 11/11/2007   ALLERGIC RESPIRATORY DISEASE, EXTRINSIC 11/11/2007   THYROID CANCER 08/19/2007   Dyslipidemia 08/19/2007   Anxiety disorder 08/19/2007   Situational depression 08/19/2007   Allergic rhinitis 08/19/2007    Mckay Brandt, [place], PT 11/15/2020, 11:37 AM  [facility] [address] [place], [place], [address] Phone: [phone]   Fax:  [phone]  Name: TYRONICA TRUXILLO MRN: [id number] Date of Birth: September 29, 1940

## 2020-11-16 ENCOUNTER — Other Ambulatory Visit: Payer: Self-pay | Admitting: Internal Medicine

## 2020-11-16 ENCOUNTER — Ambulatory Visit (INDEPENDENT_AMBULATORY_CARE_PROVIDER_SITE_OTHER): Payer: Medicare Other | Admitting: Internal Medicine

## 2020-11-16 ENCOUNTER — Encounter: Payer: Self-pay | Admitting: Internal Medicine

## 2020-11-16 ENCOUNTER — Other Ambulatory Visit: Payer: Self-pay

## 2020-11-16 VITALS — BP 110/72 | HR 119 | Temp 98.9°F | Ht 63.0 in | Wt 145.4 lb

## 2020-11-16 DIAGNOSIS — I679 Cerebrovascular disease, unspecified: Secondary | ICD-10-CM

## 2020-11-16 DIAGNOSIS — J329 Chronic sinusitis, unspecified: Secondary | ICD-10-CM | POA: Insufficient documentation

## 2020-11-16 DIAGNOSIS — E785 Hyperlipidemia, unspecified: Secondary | ICD-10-CM

## 2020-11-16 DIAGNOSIS — R42 Dizziness and giddiness: Secondary | ICD-10-CM

## 2020-11-16 DIAGNOSIS — I10 Essential (primary) hypertension: Secondary | ICD-10-CM

## 2020-11-16 DIAGNOSIS — R0609 Other forms of dyspnea: Secondary | ICD-10-CM | POA: Insufficient documentation

## 2020-11-16 DIAGNOSIS — I2510 Atherosclerotic heart disease of native coronary artery without angina pectoris: Secondary | ICD-10-CM

## 2020-11-16 DIAGNOSIS — I700 Atherosclerosis of aorta: Secondary | ICD-10-CM | POA: Diagnosis not present

## 2020-11-16 DIAGNOSIS — J320 Chronic maxillary sinusitis: Secondary | ICD-10-CM

## 2020-11-16 NOTE — Assessment & Plan Note (Signed)
Cont on Crestor, Plavix po

## 2020-11-16 NOTE — Assessment & Plan Note (Signed)
Cont on Losartan °

## 2020-11-16 NOTE — Assessment & Plan Note (Signed)
Cont on Crestor 

## 2020-11-16 NOTE — Assessment & Plan Note (Signed)
SOB in PT. Likely deconditioning vs heart related. Cardiol ref

## 2020-11-16 NOTE — Assessment & Plan Note (Signed)
ENT consult

## 2020-11-16 NOTE — Assessment & Plan Note (Addendum)
In PT. Better

## 2020-11-16 NOTE — Progress Notes (Signed)
Subjective:  Patient ID: [name] [name], female    DOB: [birth year]  Age: 80 y.o. MRN: [id number]  CC: Follow-up (2 month f/u)   HPI [name] [name] presents for vertigo, anxiety In PT. C/o SOB and tightness in the chest w/exercise. SOB in PT.    Outpatient Medications Prior to Visit  Medication Sig Dispense Refill   ALPRAZolam (XANAX) 1 MG tablet TAKE 0.5-1 TABLETS (0.5-1 MG TOTAL) BY MOUTH 2 (TWO) TIMES DAILY AS NEEDED FOR ANXIETY. 60 tablet 3   anastrozole (ARIMIDEX) 1 MG tablet TAKE 1 TABLET BY MOUTH EVERY DAY 90 tablet 4   cholecalciferol (VITAMIN D) 1000 units tablet Take 2 tablets (2,000 Units total) by mouth daily. 100 tablet 3   clopidogrel (PLAVIX) 75 MG tablet Take 1 tablet (75 mg total) by mouth daily. 90 tablet 3   levothyroxine (SYNTHROID) 112 MCG tablet TAKE 1 TABLET (112 MCG TOTAL) BY MOUTH DAILY. 90 tablet 3   meclizine (ANTIVERT) 25 MG tablet Take 1 tablet (25 mg total) by mouth 3 (three) times daily as needed for dizziness. 30 tablet 0   rosuvastatin (CRESTOR) 20 MG tablet TAKE 1 TABLET BY MOUTH DAILY 90 tablet 1   telmisartan (MICARDIS) 40 MG tablet TAKE 1 TABLET BY MOUTH EVERY DAY 90 tablet 1   venlafaxine XR (EFFEXOR XR) 37.5 MG 24 hr capsule Take 1 capsule (37.5 mg total) by mouth daily with breakfast. 90 capsule 3   vitamin B-12 (CYANOCOBALAMIN) 1000 MCG tablet Take 1,000 mcg by mouth daily.     No facility-administered medications prior to visit.    ROS: Review of Systems  Constitutional:  Positive for fatigue. Negative for activity change, appetite change, chills and unexpected weight change.  HENT:  Negative for congestion, mouth sores and sinus pressure.   Eyes:  Negative for visual disturbance.  Respiratory:  Positive for shortness of breath. Negative for cough and chest tightness.   Cardiovascular:  Negative for chest pain.  Gastrointestinal:  Negative for abdominal pain and nausea.  Genitourinary:  Negative for difficulty urinating, frequency and  vaginal pain.  Musculoskeletal:  Positive for arthralgias and back pain. Negative for gait problem.  Skin:  Negative for pallor and rash.  Neurological:  Positive for dizziness. Negative for tremors, weakness, numbness and headaches.  Psychiatric/Behavioral:  Negative for confusion, sleep disturbance and suicidal ideas. The patient is nervous/anxious.    Objective:  BP 110/72 (BP Location: Left Arm)   Pulse (!) 119   Temp 98.9 F (37.2 C) (Oral)   Ht 5\' 3"  (1.6 m)   Wt 145 lb 6.4 oz (66 kg)   SpO2 97%   BMI 25.76 kg/m   BP Readings from Last 3 Encounters:  11/16/20 110/72  10/17/20 99/73  10/12/20 132/77    Wt Readings from Last 3 Encounters:  11/16/20 145 lb 6.4 oz (66 kg)  10/12/20 146 lb 11.2 oz (66.5 kg)  08/28/20 145 lb (65.8 kg)    Physical Exam Constitutional:      General: She is not in acute distress.    Appearance: She is well-developed.  HENT:     Head: Normocephalic.     Right Ear: External ear normal.     Left Ear: External ear normal.     Nose: Nose normal.  Eyes:     General:        Right eye: No discharge.        Left eye: No discharge.     Conjunctiva/sclera: Conjunctivae normal.  Pupils: Pupils are equal, round, and reactive to light.  Neck:     Thyroid: No thyromegaly.     Vascular: No JVD.     Trachea: No tracheal deviation.  Cardiovascular:     Rate and Rhythm: Normal rate and regular rhythm.     Heart sounds: Normal heart sounds.  Pulmonary:     Effort: No respiratory distress.     Breath sounds: No stridor. No wheezing.  Abdominal:     General: Bowel sounds are normal. There is no distension.     Palpations: Abdomen is soft. There is no mass.     Tenderness: There is no abdominal tenderness. There is no guarding or rebound.  Musculoskeletal:        General: No tenderness.     Cervical back: Normal range of motion and neck supple. No rigidity.  Lymphadenopathy:     Cervical: No cervical adenopathy.  Skin:    Findings: No  erythema or rash.  Neurological:     Mental Status: She is oriented to person, place, and time.     Cranial Nerves: No cranial nerve deficit.     Motor: No abnormal muscle tone.     Coordination: Coordination abnormal.     Deep Tendon Reflexes: Reflexes normal.  Psychiatric:        Mood and Affect: Mood normal.        Behavior: Behavior normal.        Thought Content: Thought content normal.        Judgment: Judgment normal.    Lab Results  Component Value Date   WBC 4.4 08/21/2020   HGB 12.2 08/21/2020   HCT 36.0 08/21/2020   PLT 249 08/21/2020   GLUCOSE 113 (H) 10/12/2020   CHOL 134 08/10/2020   TRIG 263 (H) 08/10/2020   HDL 37 (L) 08/10/2020   LDLDIRECT 125.0 06/05/2015   LDLCALC 44 08/10/2020   ALT 16 08/21/2020   AST 27 08/21/2020   NA 143 10/12/2020   K 4.6 10/12/2020   CL 109 10/12/2020   CREATININE 1.51 (H) 10/12/2020   BUN 24 (H) 10/12/2020   CO2 24 10/12/2020   TSH 0.87 03/14/2020   INR 1.0 08/21/2020   HGBA1C 6.4 (H) 08/10/2020    MR BRAIN W WO CONTRAST  Result Date: 08/21/2020 CLINICAL DATA:  Dizziness EXAM: MRI HEAD WITHOUT AND WITH CONTRAST TECHNIQUE: Multiplanar, multiecho pulse sequences of the brain and surrounding structures were obtained without and with intravenous contrast. CONTRAST:  6.16mL GADAVIST GADOBUTROL 1 MMOL/ML IV SOLN COMPARISON:  08/09/2020 FINDINGS: Brain: There is no acute infarction or intracranial hemorrhage. There is no intracranial mass, mass effect, or edema. There is no hydrocephalus or extra-axial fluid collection. Prominence of the ventricles and sulci reflects generalized parenchymal volume loss. Confluent areas of T2 hyperintensity in the supratentorial greater than pontine white matter nonspecific but probably reflect advanced chronic microvascular ischemic changes. No abnormal enhancement. Vascular: Major vessel flow voids at the skull base are preserved. Skull and upper cervical spine: Normal marrow signal is preserved.  Sinuses/Orbits: Mild mucosal thickening.  Orbits are unremarkable. Other: Sella is unremarkable.  Mastoid air cells are clear. IMPRESSION: No evidence of recent infarction, hemorrhage, or mass. Advanced chronic microvascular ischemic changes. No substantial change since recent prior study. Electronically Signed   By: Macy Mis M.D.   On: 08/21/2020 16:23   CT HEAD CODE STROKE WO CONTRAST  Result Date: 08/21/2020 CLINICAL DATA:  Code stroke. Dizziness. Deviating to the left side with walking.  Last seen normal 0930 hours. EXAM: CT HEAD WITHOUT CONTRAST TECHNIQUE: Contiguous axial images were obtained from the base of the skull through the vertex without intravenous contrast. COMPARISON:  Brain MRI 08/09/2020.  Head CT 03/17/2020. FINDINGS: Brain: Age related atrophy. Chronic small-vessel ischemic changes of the pons. No focal cerebellar finding. Confluent chronic small vessel ischemic changes throughout the hemispheric white matter. No cortical or large vessel territory infarction. No mass lesion, hemorrhage, hydrocephalus or extra-axial collection. Vascular: No abnormal vascular finding. Skull: Negative Sinuses/Orbits: Clear/normal Other: None ASPECTS ([place] Stroke Program Early CT Score) - Ganglionic level infarction (caudate, lentiform nuclei, internal capsule, insula, M1-M3 cortex): 7 - Supraganglionic infarction (M4-M6 cortex): 3 Total score (0-10 with 10 being normal): 10 IMPRESSION: 1. Atrophy with chronic small-vessel ischemic changes, extensive throughout the cerebral hemispheric white matter. No acute finding by CT. 2. ASPECTS is 10 3. These results were communicated to Dr. Erlinda Hong at 1:41 pm on 08/21/2020 by text page via the [facility] messaging system. Electronically Signed   By: Nelson Chimes M.D.   On: 08/21/2020 13:42    Assessment & Plan:   Problem List Items Addressed This Visit     Aortic atherosclerosis ([place])    Cont on Crestor      Cerebrovascular disease    Cont on Crestor, Plavix po       Chronic sinusitis    ENT consult      Relevant Orders   Ambulatory referral to ENT   Coronary artery disease    SOB in PT. Likely deconditioning vs heart related. Cardiol ref      Relevant Orders   Ambulatory referral to Cardiology   DOE (dyspnea on exertion)    SOB in PT. Likely deconditioning vs heart related.  2022 ECHO: 1. Left ventricular ejection fraction, by estimation, is 60 to 65%. The  left ventricle has normal function. The left ventricle has no regional  wall motion abnormalities. Left ventricular diastolic parameters are  consistent with Grade I diastolic  dysfunction (impaired relaxation).   2. Right ventricular systolic function is normal. The right ventricular  size is normal. Tricuspid regurgitation signal is inadequate for assessing  PA pressure.   3. The mitral valve is grossly normal. No evidence of mitral valve  regurgitation. No evidence of mitral stenosis.   4. The aortic valve is tricuspid. Aortic valve regurgitation is not  visualized. No aortic stenosis is present.   5. The inferior vena cava is normal in size with greater than 50%  respiratory variability, suggesting right atrial pressure of 3 mmHg.       Relevant Orders   Ambulatory referral to Cardiology   Dyslipidemia    Cont on Crestor      Essential hypertension    Cont on Losartan      VERTIGO - Primary    In PT. Better      Relevant Orders   Ambulatory referral to ENT      Follow-up: Return in about 6 weeks (around 12/28/2020) for a follow-up visit.  Walker Kehr, MD

## 2020-11-16 NOTE — Assessment & Plan Note (Addendum)
SOB in PT. Likely deconditioning vs heart related.  2022 ECHO: 1. Left ventricular ejection fraction, by estimation, is 60 to 65%. The  left ventricle has normal function. The left ventricle has no regional  wall motion abnormalities. Left ventricular diastolic parameters are  consistent with Grade I diastolic  dysfunction (impaired relaxation).  2. Right ventricular systolic function is normal. The right ventricular  size is normal. Tricuspid regurgitation signal is inadequate for assessing  PA pressure.  3. The mitral valve is grossly normal. No evidence of mitral valve  regurgitation. No evidence of mitral stenosis.  4. The aortic valve is tricuspid. Aortic valve regurgitation is not  visualized. No aortic stenosis is present.  5. The inferior vena cava is normal in size with greater than 50%  respiratory variability, suggesting right atrial pressure of 3 mmHg.

## 2020-11-20 ENCOUNTER — Other Ambulatory Visit: Payer: Self-pay | Admitting: *Deleted

## 2020-11-20 MED ORDER — MECLIZINE HCL 25 MG PO TABS: 25.0000 mg | ORAL_TABLET | Freq: Three times a day (TID) | ORAL | 0 refills | Status: DC | PRN

## 2020-11-21 ENCOUNTER — Ambulatory Visit: Payer: Medicare Other | Admitting: Physical Therapy

## 2020-11-23 ENCOUNTER — Telehealth: Payer: Self-pay | Admitting: *Deleted

## 2020-11-23 ENCOUNTER — Encounter: Payer: Medicare Other | Admitting: Physical Therapy

## 2020-11-23 NOTE — Telephone Encounter (Addendum)
FYI.Marland KitchenMarland KitchenDr. Lucia Gaskins is retiring  ----- Message from Larey Seat sent at 11/23/2020 11:25 AM EDT ----- [phone] home phone Provider retiring, recommended Dr. Kasandra Knudsen Jonathon Bellows Benjamine Mola

## 2020-11-24 NOTE — Telephone Encounter (Signed)
Noted. Agree. Thank you.

## 2020-12-05 ENCOUNTER — Encounter: Payer: Self-pay | Admitting: Oncology

## 2020-12-28 ENCOUNTER — Ambulatory Visit: Payer: Medicare Other | Admitting: Internal Medicine

## 2021-01-01 ENCOUNTER — Ambulatory Visit: Payer: Medicare Other | Admitting: Cardiology

## 2021-01-15 ENCOUNTER — Ambulatory Visit: Payer: Medicare Other | Admitting: Internal Medicine

## 2021-01-15 ENCOUNTER — Ambulatory Visit: Payer: Medicare Other | Admitting: Emergency Medicine

## 2021-01-22 ENCOUNTER — Ambulatory Visit: Payer: Medicare Other | Admitting: Internal Medicine

## 2021-02-19 ENCOUNTER — Ambulatory Visit (INDEPENDENT_AMBULATORY_CARE_PROVIDER_SITE_OTHER): Payer: Medicare Other | Admitting: Internal Medicine

## 2021-02-19 ENCOUNTER — Other Ambulatory Visit: Payer: Self-pay

## 2021-02-19 ENCOUNTER — Encounter: Payer: Self-pay | Admitting: Internal Medicine

## 2021-02-19 VITALS — BP 112/68 | HR 127 | Temp 98.2°F | Ht 63.0 in | Wt 143.6 lb

## 2021-02-19 DIAGNOSIS — I2510 Atherosclerotic heart disease of native coronary artery without angina pectoris: Secondary | ICD-10-CM | POA: Diagnosis not present

## 2021-02-19 DIAGNOSIS — R002 Palpitations: Secondary | ICD-10-CM | POA: Diagnosis not present

## 2021-02-19 DIAGNOSIS — E538 Deficiency of other specified B group vitamins: Secondary | ICD-10-CM

## 2021-02-19 DIAGNOSIS — I10 Essential (primary) hypertension: Secondary | ICD-10-CM

## 2021-02-19 DIAGNOSIS — I679 Cerebrovascular disease, unspecified: Secondary | ICD-10-CM

## 2021-02-19 DIAGNOSIS — R000 Tachycardia, unspecified: Secondary | ICD-10-CM | POA: Diagnosis not present

## 2021-02-19 NOTE — Assessment & Plan Note (Signed)
On Vit D 

## 2021-02-19 NOTE — Assessment & Plan Note (Signed)
No angina 

## 2021-02-19 NOTE — Assessment & Plan Note (Addendum)
New. Check EKG - S tachy/ low BP Hold Telmisartan due to low BP RTC 1 wk

## 2021-02-19 NOTE — Assessment & Plan Note (Signed)
Cont on Crestor, Plavix po

## 2021-02-19 NOTE — Assessment & Plan Note (Signed)
New. Check EKG - S tachy/ low BP Hold Telmisartan due to low BP

## 2021-02-19 NOTE — Patient Instructions (Addendum)
Hold Telmisartan due to low BP   

## 2021-02-19 NOTE — Assessment & Plan Note (Addendum)
New.S tachy/ low BP Hold Telmisartan due to low BP RTC 1 wk

## 2021-02-19 NOTE — Progress Notes (Signed)
Subjective:  Patient ID: [name] [name], female    DOB: [birth year]  Age: 81 y.o. MRN: [id number]  CC: Follow-up (6 WEEK F/U)   HPI [name] [name] presents for depression, anxiety, HTN, hypothyroidism  Outpatient Medications Prior to Visit  Medication Sig Dispense Refill   ALPRAZolam (XANAX) 1 MG tablet TAKE 0.5-1 TABLETS (0.5-1 MG TOTAL) BY MOUTH 2 (TWO) TIMES DAILY AS NEEDED FOR ANXIETY. 60 tablet 3   anastrozole (ARIMIDEX) 1 MG tablet TAKE 1 TABLET BY MOUTH EVERY DAY 90 tablet 4   cholecalciferol (VITAMIN D) 1000 units tablet Take 2 tablets (2,000 Units total) by mouth daily. 100 tablet 3   clopidogrel (PLAVIX) 75 MG tablet Take 1 tablet (75 mg total) by mouth daily. 90 tablet 3   levothyroxine (SYNTHROID) 112 MCG tablet TAKE 1 TABLET (112 MCG TOTAL) BY MOUTH DAILY. 90 tablet 3   meclizine (ANTIVERT) 25 MG tablet Take 1 tablet (25 mg total) by mouth 3 (three) times daily as needed for dizziness. 30 tablet 0   rosuvastatin (CRESTOR) 20 MG tablet TAKE 1 TABLET BY MOUTH DAILY 90 tablet 1   telmisartan (MICARDIS) 40 MG tablet TAKE 1 TABLET BY MOUTH EVERY DAY 90 tablet 1   venlafaxine XR (EFFEXOR XR) 37.5 MG 24 hr capsule Take 1 capsule (37.5 mg total) by mouth daily with breakfast. 90 capsule 3   vitamin B-12 (CYANOCOBALAMIN) 1000 MCG tablet Take 1,000 mcg by mouth daily.     No facility-administered medications prior to visit.    ROS: Review of Systems  Constitutional:  Negative for activity change, appetite change, chills, fatigue and unexpected weight change.  HENT:  Negative for congestion, hearing loss, mouth sores and sinus pressure.   Eyes:  Negative for visual disturbance.  Respiratory:  Negative for cough and chest tightness.   Cardiovascular:  Positive for palpitations.  Gastrointestinal:  Negative for abdominal pain and nausea.  Genitourinary:  Negative for difficulty urinating, frequency and vaginal pain.  Musculoskeletal:  Positive for arthralgias. Negative for back  pain and gait problem.  Skin:  Negative for pallor and rash.  Neurological:  Positive for dizziness. Negative for tremors, weakness, numbness and headaches.  Psychiatric/Behavioral:  Negative for confusion and sleep disturbance.   All other systems reviewed and are negative.  Objective:  BP 112/68 (BP Location: Left Arm)    Pulse (!) 127    Temp 98.2 F (36.8 C) (Oral)    Ht 5\' 3"  (1.6 m)    Wt 143 lb 9.6 oz (65.1 kg)    SpO2 95%    BMI 25.44 kg/m   BP Readings from Last 3 Encounters:  02/19/21 112/68  11/16/20 110/72  10/17/20 99/73    Wt Readings from Last 3 Encounters:  02/19/21 143 lb 9.6 oz (65.1 kg)  11/16/20 145 lb 6.4 oz (66 kg)  10/12/20 146 lb 11.2 oz (66.5 kg)    Physical Exam Constitutional:      General: She is not in acute distress.    Appearance: She is well-developed.  HENT:     Head: Normocephalic.     Right Ear: External ear normal.     Left Ear: External ear normal.     Nose: Nose normal.  Eyes:     General:        Right eye: No discharge.        Left eye: No discharge.     Conjunctiva/sclera: Conjunctivae normal.     Pupils: Pupils are equal, round, and reactive to light.  Neck:     Thyroid: No thyromegaly.     Vascular: No JVD.     Trachea: No tracheal deviation.  Cardiovascular:     Rate and Rhythm: Regular rhythm. Tachycardia present.     Heart sounds: Normal heart sounds.  Pulmonary:     Effort: No respiratory distress.     Breath sounds: No stridor. No wheezing.  Abdominal:     General: Bowel sounds are normal. There is no distension.     Palpations: Abdomen is soft. There is no mass.     Tenderness: There is no abdominal tenderness. There is no guarding or rebound.  Musculoskeletal:        General: No tenderness.     Cervical back: Normal range of motion and neck supple. No rigidity.  Lymphadenopathy:     Cervical: No cervical adenopathy.  Skin:    Findings: No erythema or rash.  Neurological:     Cranial Nerves: No cranial nerve  deficit.     Motor: No abnormal muscle tone.     Coordination: Coordination normal.     Deep Tendon Reflexes: Reflexes normal.  Psychiatric:        Behavior: Behavior normal.        Thought Content: Thought content normal.        Judgment: Judgment normal.    Procedure: EKG Indication: palpitations Impression: S tachy. HR 92   Lab Results  Component Value Date   WBC 4.4 08/21/2020   HGB 12.2 08/21/2020   HCT 36.0 08/21/2020   PLT 249 08/21/2020   GLUCOSE 113 (H) 10/12/2020   CHOL 134 08/10/2020   TRIG 263 (H) 08/10/2020   HDL 37 (L) 08/10/2020   LDLDIRECT 125.0 06/05/2015   LDLCALC 44 08/10/2020   ALT 16 08/21/2020   AST 27 08/21/2020   NA 143 10/12/2020   K 4.6 10/12/2020   CL 109 10/12/2020   CREATININE 1.51 (H) 10/12/2020   BUN 24 (H) 10/12/2020   CO2 24 10/12/2020   TSH 0.87 03/14/2020   INR 1.0 08/21/2020   HGBA1C 6.4 (H) 08/10/2020    MR BRAIN W WO CONTRAST  Result Date: 08/21/2020 CLINICAL DATA:  Dizziness EXAM: MRI HEAD WITHOUT AND WITH CONTRAST TECHNIQUE: Multiplanar, multiecho pulse sequences of the brain and surrounding structures were obtained without and with intravenous contrast. CONTRAST:  6.76mL GADAVIST GADOBUTROL 1 MMOL/ML IV SOLN COMPARISON:  08/09/2020 FINDINGS: Brain: There is no acute infarction or intracranial hemorrhage. There is no intracranial mass, mass effect, or edema. There is no hydrocephalus or extra-axial fluid collection. Prominence of the ventricles and sulci reflects generalized parenchymal volume loss. Confluent areas of T2 hyperintensity in the supratentorial greater than pontine white matter nonspecific but probably reflect advanced chronic microvascular ischemic changes. No abnormal enhancement. Vascular: Major vessel flow voids at the skull base are preserved. Skull and upper cervical spine: Normal marrow signal is preserved. Sinuses/Orbits: Mild mucosal thickening.  Orbits are unremarkable. Other: Sella is unremarkable.  Mastoid air  cells are clear. IMPRESSION: No evidence of recent infarction, hemorrhage, or mass. Advanced chronic microvascular ischemic changes. No substantial change since recent prior study. Electronically Signed   By: Macy Mis M.D.   On: 08/21/2020 16:23   CT HEAD CODE STROKE WO CONTRAST  Result Date: 08/21/2020 CLINICAL DATA:  Code stroke. Dizziness. Deviating to the left side with walking. Last seen normal 0930 hours. EXAM: CT HEAD WITHOUT CONTRAST TECHNIQUE: Contiguous axial images were obtained from the base of the skull through the vertex without  intravenous contrast. COMPARISON:  Brain MRI 08/09/2020.  Head CT 03/17/2020. FINDINGS: Brain: Age related atrophy. Chronic small-vessel ischemic changes of the pons. No focal cerebellar finding. Confluent chronic small vessel ischemic changes throughout the hemispheric white matter. No cortical or large vessel territory infarction. No mass lesion, hemorrhage, hydrocephalus or extra-axial collection. Vascular: No abnormal vascular finding. Skull: Negative Sinuses/Orbits: Clear/normal Other: None ASPECTS ([place] Stroke Program Early CT Score) - Ganglionic level infarction (caudate, lentiform nuclei, internal capsule, insula, M1-M3 cortex): 7 - Supraganglionic infarction (M4-M6 cortex): 3 Total score (0-10 with 10 being normal): 10 IMPRESSION: 1. Atrophy with chronic small-vessel ischemic changes, extensive throughout the cerebral hemispheric white matter. No acute finding by CT. 2. ASPECTS is 10 3. These results were communicated to Dr. Erlinda Hong at 1:41 pm on 08/21/2020 by text page via the [facility] messaging system. Electronically Signed   By: Nelson Chimes M.D.   On: 08/21/2020 13:42    Assessment & Plan:   Problem List Items Addressed This Visit     B12 deficiency    On Vit D      Cerebrovascular disease    Cont on Crestor, Plavix po      Coronary artery disease    No angina      Essential hypertension    New.S tachy/ low BP Hold Telmisartan due to low  BP RTC 1 wk      Palpitations    New. Check EKG - S tachy/ low BP Hold Telmisartan due to low BP RTC 1 wk      Sinus tachycardia - Primary    New. Check EKG - S tachy/ low BP Hold Telmisartan due to low BP         No orders of the defined types were placed in this encounter.     Follow-up: Return in about 1 week (around 02/26/2021) for a follow-up visit.  Walker Kehr, MD

## 2021-02-26 ENCOUNTER — Ambulatory Visit (INDEPENDENT_AMBULATORY_CARE_PROVIDER_SITE_OTHER): Payer: Medicare Other | Admitting: Internal Medicine

## 2021-02-26 ENCOUNTER — Encounter: Payer: Self-pay | Admitting: Internal Medicine

## 2021-02-26 ENCOUNTER — Other Ambulatory Visit: Payer: Self-pay

## 2021-02-26 DIAGNOSIS — F419 Anxiety disorder, unspecified: Secondary | ICD-10-CM

## 2021-02-26 DIAGNOSIS — R0609 Other forms of dyspnea: Secondary | ICD-10-CM

## 2021-02-26 DIAGNOSIS — I10 Essential (primary) hypertension: Secondary | ICD-10-CM | POA: Diagnosis not present

## 2021-02-26 DIAGNOSIS — E538 Deficiency of other specified B group vitamins: Secondary | ICD-10-CM | POA: Diagnosis not present

## 2021-02-26 MED ORDER — VENLAFAXINE HCL ER 75 MG PO CP24: 75.0000 mg | ORAL_CAPSULE | Freq: Every day | ORAL | 3 refills | Status: DC

## 2021-02-26 NOTE — Assessment & Plan Note (Addendum)
Increase Effexor if tolerated Psychology ref is offered

## 2021-02-26 NOTE — Assessment & Plan Note (Signed)
On B12 

## 2021-02-26 NOTE — Assessment & Plan Note (Signed)
We d/cTelmisartan due to low BP and tachycardia. DOE is resolving

## 2021-02-26 NOTE — Assessment & Plan Note (Signed)
D/cTelmisartan due to low BP

## 2021-02-26 NOTE — Progress Notes (Signed)
Subjective:  Patient ID: [name] [name], female    DOB: December 14, 1940  Age: 81 y.o. MRN: [id number]  CC: Follow-up (1 week f/u)   HPI [name] [name] presents for tachycardia, low BP f/u - better off Telmisartan C/o stress, depression - sister w/tonsil cancer C/o depression - worse    Outpatient Medications Prior to Visit  Medication Sig Dispense Refill   ALPRAZolam (XANAX) 1 MG tablet TAKE 0.5-1 TABLETS (0.5-1 MG TOTAL) BY MOUTH 2 (TWO) TIMES DAILY AS NEEDED FOR ANXIETY. 60 tablet 3   anastrozole (ARIMIDEX) 1 MG tablet TAKE 1 TABLET BY MOUTH EVERY DAY 90 tablet 4   cholecalciferol (VITAMIN D) 1000 units tablet Take 2 tablets (2,000 Units total) by mouth daily. 100 tablet 3   clopidogrel (PLAVIX) 75 MG tablet Take 1 tablet (75 mg total) by mouth daily. 90 tablet 3   levothyroxine (SYNTHROID) 112 MCG tablet TAKE 1 TABLET (112 MCG TOTAL) BY MOUTH DAILY. 90 tablet 3   meclizine (ANTIVERT) 25 MG tablet Take 1 tablet (25 mg total) by mouth 3 (three) times daily as needed for dizziness. 30 tablet 0   rosuvastatin (CRESTOR) 20 MG tablet TAKE 1 TABLET BY MOUTH DAILY 90 tablet 1   vitamin B-12 (CYANOCOBALAMIN) 1000 MCG tablet Take 1,000 mcg by mouth daily.     telmisartan (MICARDIS) 40 MG tablet TAKE 1 TABLET BY MOUTH EVERY DAY 90 tablet 1   venlafaxine XR (EFFEXOR XR) 37.5 MG 24 hr capsule Take 1 capsule (37.5 mg total) by mouth daily with breakfast. 90 capsule 3   No facility-administered medications prior to visit.    ROS: Review of Systems  Constitutional:  Positive for fatigue. Negative for activity change, appetite change, chills and unexpected weight change.  HENT:  Negative for congestion, mouth sores and sinus pressure.   Eyes:  Negative for visual disturbance.  Respiratory:  Negative for cough, chest tightness and shortness of breath.   Cardiovascular:  Negative for chest pain, palpitations and leg swelling.  Gastrointestinal:  Negative for abdominal pain and nausea.   Genitourinary:  Negative for difficulty urinating, frequency and vaginal pain.  Musculoskeletal:  Negative for back pain and gait problem.  Skin:  Negative for pallor and rash.  Neurological:  Negative for dizziness, tremors, weakness, numbness and headaches.  Psychiatric/Behavioral:  Positive for dysphoric mood. Negative for confusion, sleep disturbance and suicidal ideas. The patient is nervous/anxious.    Objective:  BP 118/82 (BP Location: Left Arm)    Pulse 90    Temp 97.9 F (36.6 C) (Oral)    Ht 5\' 3"  (1.6 m)    Wt 143 lb 9.6 oz (65.1 kg)    SpO2 96%    BMI 25.44 kg/m   BP Readings from Last 3 Encounters:  02/26/21 118/82  02/19/21 112/68  11/16/20 110/72    Wt Readings from Last 3 Encounters:  02/26/21 143 lb 9.6 oz (65.1 kg)  02/19/21 143 lb 9.6 oz (65.1 kg)  11/16/20 145 lb 6.4 oz (66 kg)    Physical Exam Constitutional:      General: She is not in acute distress.    Appearance: She is well-developed.  HENT:     Head: Normocephalic.     Right Ear: External ear normal.     Left Ear: External ear normal.     Nose: Nose normal.  Eyes:     General:        Right eye: No discharge.        Left eye:  No discharge.     Conjunctiva/sclera: Conjunctivae normal.     Pupils: Pupils are equal, round, and reactive to light.  Neck:     Thyroid: No thyromegaly.     Vascular: No JVD.     Trachea: No tracheal deviation.  Cardiovascular:     Rate and Rhythm: Normal rate and regular rhythm.     Heart sounds: Normal heart sounds.  Pulmonary:     Effort: No respiratory distress.     Breath sounds: No stridor. No wheezing.  Abdominal:     General: Bowel sounds are normal. There is no distension.     Palpations: Abdomen is soft. There is no mass.     Tenderness: There is no abdominal tenderness. There is no guarding or rebound.  Musculoskeletal:        General: No tenderness.     Cervical back: Normal range of motion and neck supple. No rigidity.  Lymphadenopathy:      Cervical: No cervical adenopathy.  Skin:    Findings: No erythema or rash.  Neurological:     Mental Status: She is oriented to person, place, and time.     Cranial Nerves: No cranial nerve deficit.     Motor: No abnormal muscle tone.     Coordination: Coordination normal.     Deep Tendon Reflexes: Reflexes normal.  Psychiatric:        Behavior: Behavior normal.        Thought Content: Thought content normal.        Judgment: Judgment normal.    Lab Results  Component Value Date   WBC 4.4 08/21/2020   HGB 12.2 08/21/2020   HCT 36.0 08/21/2020   PLT 249 08/21/2020   GLUCOSE 113 (H) 10/12/2020   CHOL 134 08/10/2020   TRIG 263 (H) 08/10/2020   HDL 37 (L) 08/10/2020   LDLDIRECT 125.0 06/05/2015   LDLCALC 44 08/10/2020   ALT 16 08/21/2020   AST 27 08/21/2020   NA 143 10/12/2020   K 4.6 10/12/2020   CL 109 10/12/2020   CREATININE 1.51 (H) 10/12/2020   BUN 24 (H) 10/12/2020   CO2 24 10/12/2020   TSH 0.87 03/14/2020   INR 1.0 08/21/2020   HGBA1C 6.4 (H) 08/10/2020    MR BRAIN W WO CONTRAST  Result Date: 08/21/2020 CLINICAL DATA:  Dizziness EXAM: MRI HEAD WITHOUT AND WITH CONTRAST TECHNIQUE: Multiplanar, multiecho pulse sequences of the brain and surrounding structures were obtained without and with intravenous contrast. CONTRAST:  6.32mL GADAVIST GADOBUTROL 1 MMOL/ML IV SOLN COMPARISON:  08/09/2020 FINDINGS: Brain: There is no acute infarction or intracranial hemorrhage. There is no intracranial mass, mass effect, or edema. There is no hydrocephalus or extra-axial fluid collection. Prominence of the ventricles and sulci reflects generalized parenchymal volume loss. Confluent areas of T2 hyperintensity in the supratentorial greater than pontine white matter nonspecific but probably reflect advanced chronic microvascular ischemic changes. No abnormal enhancement. Vascular: Major vessel flow voids at the skull base are preserved. Skull and upper cervical spine: Normal marrow signal is  preserved. Sinuses/Orbits: Mild mucosal thickening.  Orbits are unremarkable. Other: Sella is unremarkable.  Mastoid air cells are clear. IMPRESSION: No evidence of recent infarction, hemorrhage, or mass. Advanced chronic microvascular ischemic changes. No substantial change since recent prior study. Electronically Signed   By: Macy Mis M.D.   On: 08/21/2020 16:23   CT HEAD CODE STROKE WO CONTRAST  Result Date: 08/21/2020 CLINICAL DATA:  Code stroke. Dizziness. Deviating to the left side with  walking. Last seen normal 0930 hours. EXAM: CT HEAD WITHOUT CONTRAST TECHNIQUE: Contiguous axial images were obtained from the base of the skull through the vertex without intravenous contrast. COMPARISON:  Brain MRI 08/09/2020.  Head CT 03/17/2020. FINDINGS: Brain: Age related atrophy. Chronic small-vessel ischemic changes of the pons. No focal cerebellar finding. Confluent chronic small vessel ischemic changes throughout the hemispheric white matter. No cortical or large vessel territory infarction. No mass lesion, hemorrhage, hydrocephalus or extra-axial collection. Vascular: No abnormal vascular finding. Skull: Negative Sinuses/Orbits: Clear/normal Other: None ASPECTS ([place] Stroke Program Early CT Score) - Ganglionic level infarction (caudate, lentiform nuclei, internal capsule, insula, M1-M3 cortex): 7 - Supraganglionic infarction (M4-M6 cortex): 3 Total score (0-10 with 10 being normal): 10 IMPRESSION: 1. Atrophy with chronic small-vessel ischemic changes, extensive throughout the cerebral hemispheric white matter. No acute finding by CT. 2. ASPECTS is 10 3. These results were communicated to Dr. Erlinda Hong at 1:41 pm on 08/21/2020 by text page via the [facility] messaging system. Electronically Signed   By: Nelson Chimes M.D.   On: 08/21/2020 13:42    Assessment & Plan:   Problem List Items Addressed This Visit     Anxiety disorder    Increase Effexor if tolerated Psychology ref is offered      Relevant  Medications   venlafaxine XR (EFFEXOR XR) 75 MG 24 hr capsule   B12 deficiency    On B12      DOE (dyspnea on exertion)    We d/cTelmisartan due to low BP and tachycardia. DOE is resolving      Essential hypertension    D/cTelmisartan due to low BP          Meds ordered this encounter  Medications   venlafaxine XR (EFFEXOR XR) 75 MG 24 hr capsule    Sig: Take 1 capsule (75 mg total) by mouth daily with breakfast.    Dispense:  90 capsule    Refill:  3      Follow-up: Return in about 2 months (around 04/26/2021) for a follow-up visit.  Walker Kehr, MD

## 2021-04-12 ENCOUNTER — Inpatient Hospital Stay: Payer: Medicare Other

## 2021-04-12 ENCOUNTER — Inpatient Hospital Stay: Payer: Medicare Other | Admitting: Adult Health

## 2021-04-13 ENCOUNTER — Telehealth: Payer: Self-pay | Admitting: Adult Health

## 2021-04-13 NOTE — Telephone Encounter (Signed)
Patient called earlier today about seeing if her injection can be moved to something later in the afternoon or have it moved to April. Talked with the nurse and nurse states patient needs injection this month. Scheduler was not able to change current appt (lab/md/inj) to a later time; therefore I notified the patient that her current appointment could not be moved. Patient is aware is upcoming appt. ?

## 2021-04-20 ENCOUNTER — Telehealth: Payer: Self-pay | Admitting: Adult Health

## 2021-04-20 NOTE — Telephone Encounter (Signed)
.  Called patient to schedule appointment per 3/09 pt req, patient is aware of date and time.   ?

## 2021-04-25 ENCOUNTER — Other Ambulatory Visit: Payer: Medicare Other

## 2021-04-25 ENCOUNTER — Other Ambulatory Visit: Payer: Self-pay | Admitting: Internal Medicine

## 2021-04-25 ENCOUNTER — Ambulatory Visit: Payer: Medicare Other

## 2021-04-26 ENCOUNTER — Encounter: Payer: Self-pay | Admitting: Internal Medicine

## 2021-04-26 ENCOUNTER — Ambulatory Visit (INDEPENDENT_AMBULATORY_CARE_PROVIDER_SITE_OTHER): Payer: Medicare Other | Admitting: Internal Medicine

## 2021-04-26 ENCOUNTER — Other Ambulatory Visit: Payer: Self-pay

## 2021-04-26 ENCOUNTER — Other Ambulatory Visit: Payer: Medicare Other

## 2021-04-26 ENCOUNTER — Ambulatory Visit: Payer: Medicare Other

## 2021-04-26 DIAGNOSIS — F419 Anxiety disorder, unspecified: Secondary | ICD-10-CM

## 2021-04-26 DIAGNOSIS — I10 Essential (primary) hypertension: Secondary | ICD-10-CM | POA: Diagnosis not present

## 2021-04-26 DIAGNOSIS — F4321 Adjustment disorder with depressed mood: Secondary | ICD-10-CM | POA: Diagnosis not present

## 2021-04-26 DIAGNOSIS — E538 Deficiency of other specified B group vitamins: Secondary | ICD-10-CM

## 2021-04-26 MED ORDER — TELMISARTAN 20 MG PO TABS: 20.0000 mg | ORAL_TABLET | Freq: Every day | ORAL | 3 refills | Status: DC

## 2021-04-26 NOTE — Assessment & Plan Note (Signed)
Psychology ref is offered ?

## 2021-04-26 NOTE — Progress Notes (Signed)
? ?Subjective:  ?Patient ID: [name] [name], female    DOB: 30-Mar-1940  Age: 81 y.o. MRN: [id number] ? ?CC: No chief complaint on file. ? ? ?HPI ?Luetta T Herren presents for HTN - worse. C/o stress - worse... ?C/o stress sister is sick w/cancer - ?liver; terminal ? ? ?Outpatient Medications Prior to Visit  ?Medication Sig Dispense Refill  ? ALPRAZolam (XANAX) 1 MG tablet TAKE 0.5-1 TABLETS (0.5-1 MG TOTAL) BY MOUTH 2 (TWO) TIMES DAILY AS NEEDED FOR ANXIETY. 60 tablet 3  ? anastrozole (ARIMIDEX) 1 MG tablet TAKE 1 TABLET BY MOUTH EVERY DAY 90 tablet 4  ? cholecalciferol (VITAMIN D) 1000 units tablet Take 2 tablets (2,000 Units total) by mouth daily. 100 tablet 3  ? clopidogrel (PLAVIX) 75 MG tablet Take 1 tablet (75 mg total) by mouth daily. 90 tablet 3  ? levothyroxine (SYNTHROID) 112 MCG tablet TAKE 1 TABLET (112 MCG TOTAL) BY MOUTH DAILY. 90 tablet 3  ? meclizine (ANTIVERT) 25 MG tablet Take 1 tablet (25 mg total) by mouth 3 (three) times daily as needed for dizziness. 30 tablet 0  ? rosuvastatin (CRESTOR) 20 MG tablet TAKE 1 TABLET BY MOUTH DAILY 90 tablet 1  ? venlafaxine XR (EFFEXOR XR) 75 MG 24 hr capsule Take 1 capsule (75 mg total) by mouth daily with breakfast. 90 capsule 3  ? vitamin B-12 (CYANOCOBALAMIN) 1000 MCG tablet Take 1,000 mcg by mouth daily.    ? ?No facility-administered medications prior to visit.  ? ? ?ROS: ?Review of Systems  ?Constitutional:  Negative for activity change, appetite change, chills, fatigue and unexpected weight change.  ?HENT:  Negative for congestion, mouth sores and sinus pressure.   ?Eyes:  Negative for visual disturbance.  ?Respiratory:  Negative for cough and chest tightness.   ?Gastrointestinal:  Negative for abdominal pain and nausea.  ?Genitourinary:  Negative for difficulty urinating, frequency and vaginal pain.  ?Musculoskeletal:  Negative for back pain and gait problem.  ?Skin:  Negative for pallor and rash.  ?Neurological:  Negative for dizziness, tremors,  weakness, numbness and headaches.  ?Psychiatric/Behavioral:  Positive for dysphoric mood. Negative for confusion, sleep disturbance and suicidal ideas. The patient is nervous/anxious.   ? ?Objective:  ?BP 130/90 (BP Location: Left Arm, Patient Position: Sitting, Cuff Size: Large)   Pulse 87   Temp 98.7 ?F (37.1 ?C) (Oral)   Ht '5\' 3"'$  (1.6 m)   Wt 144 lb (65.3 kg)   SpO2 98%   BMI 25.51 kg/m?  ? ?BP Readings from Last 3 Encounters:  ?04/26/21 130/90  ?02/26/21 118/82  ?02/19/21 112/68  ? ? ?Wt Readings from Last 3 Encounters:  ?04/26/21 144 lb (65.3 kg)  ?02/26/21 143 lb 9.6 oz (65.1 kg)  ?02/19/21 143 lb 9.6 oz (65.1 kg)  ? ? ?Physical Exam ?Constitutional:   ?   General: She is not in acute distress. ?   Appearance: She is well-developed. She is not ill-appearing.  ?HENT:  ?   Head: Normocephalic.  ?   Right Ear: External ear normal.  ?   Left Ear: External ear normal.  ?   Nose: Nose normal.  ?Eyes:  ?   General:     ?   Right eye: No discharge.     ?   Left eye: No discharge.  ?   Conjunctiva/sclera: Conjunctivae normal.  ?   Pupils: Pupils are equal, round, and reactive to light.  ?Neck:  ?   Thyroid: No thyromegaly.  ?   Vascular:  No JVD.  ?   Trachea: No tracheal deviation.  ?Cardiovascular:  ?   Rate and Rhythm: Normal rate and regular rhythm.  ?   Heart sounds: Normal heart sounds.  ?Pulmonary:  ?   Effort: No respiratory distress.  ?   Breath sounds: No stridor. No wheezing.  ?Abdominal:  ?   General: Bowel sounds are normal. There is no distension.  ?   Palpations: Abdomen is soft. There is no mass.  ?   Tenderness: There is no abdominal tenderness. There is no guarding or rebound.  ?Musculoskeletal:     ?   General: No tenderness.  ?   Cervical back: Normal range of motion and neck supple. No rigidity.  ?Lymphadenopathy:  ?   Cervical: No cervical adenopathy.  ?Skin: ?   Findings: No erythema or rash.  ?Neurological:  ?   Mental Status: She is oriented to person, place, and time.  ?   Cranial Nerves:  No cranial nerve deficit.  ?   Motor: No abnormal muscle tone.  ?   Coordination: Coordination normal.  ?   Deep Tendon Reflexes: Reflexes normal.  ?Psychiatric:     ?   Behavior: Behavior normal.     ?   Thought Content: Thought content normal.     ?   Judgment: Judgment normal.  ? ? ?Lab Results  ?Component Value Date  ? WBC 4.4 08/21/2020  ? HGB 12.2 08/21/2020  ? HCT 36.0 08/21/2020  ? PLT 249 08/21/2020  ? GLUCOSE 113 (H) 10/12/2020  ? CHOL 134 08/10/2020  ? TRIG 263 (H) 08/10/2020  ? HDL 37 (L) 08/10/2020  ? LDLDIRECT 125.0 06/05/2015  ? [place] 44 08/10/2020  ? ALT 16 08/21/2020  ? AST 27 08/21/2020  ? NA 143 10/12/2020  ? K 4.6 10/12/2020  ? CL 109 10/12/2020  ? CREATININE 1.51 (H) 10/12/2020  ? BUN 24 (H) 10/12/2020  ? CO2 24 10/12/2020  ? TSH 0.87 03/14/2020  ? INR 1.0 08/21/2020  ? HGBA1C 6.4 (H) 08/10/2020  ? ? ?MR BRAIN W WO CONTRAST ? ?Result Date: 08/21/2020 ?CLINICAL DATA:  Dizziness EXAM: MRI HEAD WITHOUT AND WITH CONTRAST TECHNIQUE: Multiplanar, multiecho pulse sequences of the brain and surrounding structures were obtained without and with intravenous contrast. CONTRAST:  6.55m GADAVIST GADOBUTROL 1 MMOL/ML IV SOLN COMPARISON:  08/09/2020 FINDINGS: Brain: There is no acute infarction or intracranial hemorrhage. There is no intracranial mass, mass effect, or edema. There is no hydrocephalus or extra-axial fluid collection. Prominence of the ventricles and sulci reflects generalized parenchymal volume loss. Confluent areas of T2 hyperintensity in the supratentorial greater than pontine white matter nonspecific but probably reflect advanced chronic microvascular ischemic changes. No abnormal enhancement. Vascular: Major vessel flow voids at the skull base are preserved. Skull and upper cervical spine: Normal marrow signal is preserved. Sinuses/Orbits: Mild mucosal thickening.  Orbits are unremarkable. Other: Sella is unremarkable.  Mastoid air cells are clear. IMPRESSION: No evidence of recent  infarction, hemorrhage, or mass. Advanced chronic microvascular ischemic changes. No substantial change since recent prior study. Electronically Signed   By: PMacy MisM.D.   On: 08/21/2020 16:23  ? ?CT HEAD CODE STROKE WO CONTRAST ? ?Result Date: 08/21/2020 ?CLINICAL DATA:  Code stroke. Dizziness. Deviating to the left side with walking. Last seen normal 0930 hours. EXAM: CT HEAD WITHOUT CONTRAST TECHNIQUE: Contiguous axial images were obtained from the base of the skull through the vertex without intravenous contrast. COMPARISON:  Brain MRI 08/09/2020.  Head  CT 03/17/2020. FINDINGS: Brain: Age related atrophy. Chronic small-vessel ischemic changes of the pons. No focal cerebellar finding. Confluent chronic small vessel ischemic changes throughout the hemispheric white matter. No cortical or large vessel territory infarction. No mass lesion, hemorrhage, hydrocephalus or extra-axial collection. Vascular: No abnormal vascular finding. Skull: Negative Sinuses/Orbits: Clear/normal Other: None ASPECTS ([place] Stroke Program Early CT Score) - Ganglionic level infarction (caudate, lentiform nuclei, internal capsule, insula, M1-M3 cortex): 7 - Supraganglionic infarction (M4-M6 cortex): 3 Total score (0-10 with 10 being normal): 10 IMPRESSION: 1. Atrophy with chronic small-vessel ischemic changes, extensive throughout the cerebral hemispheric white matter. No acute finding by CT. 2. ASPECTS is 10 3. These results were communicated to Dr. Erlinda Hong at 1:41 pm on 08/21/2020 by text page via the [facility] messaging system. Electronically Signed   By: Nelson Chimes M.D.   On: 08/21/2020 13:42  ? ? ?Assessment & Plan:  ? ?Problem List Items Addressed This Visit   ? ? Anxiety disorder  ?  Psychology ref is offered ?  ?  ? B12 deficiency  ?  On B12 ?  ?  ? Essential hypertension  ?  Worse due to stress ?Re-start Telmisartan at lower dose ?  ?  ? Relevant Medications  ? telmisartan (MICARDIS) 20 MG tablet  ? Situational depression  ?   Worse due to stress - sister w/cancer ?Discussed ?  ?  ?  ? ? ?Meds ordered this encounter  ?Medications  ? telmisartan (MICARDIS) 20 MG tablet  ?  Sig: Take 1 tablet (20 mg total) by mouth daily.  ?  Dispense:  90 tablet

## 2021-04-26 NOTE — Assessment & Plan Note (Signed)
Worse due to stress - sister w/cancer ?Discussed ?

## 2021-04-26 NOTE — Assessment & Plan Note (Signed)
Worse due to stress ?Re-start Telmisartan at lower dose ?

## 2021-04-26 NOTE — Assessment & Plan Note (Signed)
On B12 

## 2021-04-30 ENCOUNTER — Telehealth: Payer: Self-pay | Admitting: Internal Medicine

## 2021-04-30 NOTE — Telephone Encounter (Signed)
Connected to [facility] 3.18.2023. ? ?Caller states she was seen on Thursday and before she went in on Tuesday or Wednesday, she called to get a refill for Alprazolam. Her sister was moved yesterday to [facility]osp[facility], and they don't think she will live for about 3-4 days. The pharmacy still does not have a rx, and they said they tried to contact the doctor. She would like some to get her through while she is dealing ?with this. Condolences offered. ? ?Pt politely declines triage-advised pt to please call office on Mon about med.  ? ? ?

## 2021-05-01 NOTE — Telephone Encounter (Signed)
Her alprazolam prescription was renewed on 3/19. ?Thank you ?

## 2021-05-07 ENCOUNTER — Other Ambulatory Visit: Payer: Self-pay

## 2021-05-07 DIAGNOSIS — Z170 Estrogen receptor positive status [ER+]: Secondary | ICD-10-CM

## 2021-05-08 ENCOUNTER — Encounter: Payer: Self-pay | Admitting: Adult Health

## 2021-05-08 ENCOUNTER — Ambulatory Visit: Payer: Medicare Other

## 2021-05-08 ENCOUNTER — Inpatient Hospital Stay: Payer: Medicare Other | Attending: Adult Health

## 2021-05-08 ENCOUNTER — Other Ambulatory Visit: Payer: Self-pay

## 2021-05-08 ENCOUNTER — Inpatient Hospital Stay: Payer: Medicare Other | Admitting: Adult Health

## 2021-05-08 ENCOUNTER — Other Ambulatory Visit: Payer: Medicare Other

## 2021-05-08 ENCOUNTER — Inpatient Hospital Stay: Payer: Medicare Other

## 2021-05-08 VITALS — BP 119/76 | HR 89 | Temp 97.7°F | Resp 18 | Ht 63.0 in | Wt 143.4 lb

## 2021-05-08 DIAGNOSIS — Z853 Personal history of malignant neoplasm of breast: Secondary | ICD-10-CM | POA: Insufficient documentation

## 2021-05-08 DIAGNOSIS — Z170 Estrogen receptor positive status [ER+]: Secondary | ICD-10-CM

## 2021-05-08 DIAGNOSIS — M810 Age-related osteoporosis without current pathological fracture: Secondary | ICD-10-CM | POA: Diagnosis not present

## 2021-05-08 DIAGNOSIS — C50412 Malignant neoplasm of upper-outer quadrant of left female breast: Secondary | ICD-10-CM | POA: Diagnosis not present

## 2021-05-08 DIAGNOSIS — Z95828 Presence of other vascular implants and grafts: Secondary | ICD-10-CM

## 2021-05-08 LAB — CBC WITH DIFFERENTIAL (CANCER CENTER ONLY)
Abs Immature Granulocytes: 0.02 10*3/uL (ref 0.00–0.07)
Basophils Absolute: 0 10*3/uL (ref 0.0–0.1)
Basophils Relative: 0 %
Eosinophils Absolute: 0.1 10*3/uL (ref 0.0–0.5)
Eosinophils Relative: 2 %
HCT: 37.4 % (ref 36.0–46.0)
Hemoglobin: 12 g/dL (ref 12.0–15.0)
Immature Granulocytes: 0 %
Lymphocytes Relative: 41 %
Lymphs Abs: 2.2 10*3/uL (ref 0.7–4.0)
MCH: 29.3 pg (ref 26.0–34.0)
MCHC: 32.1 g/dL (ref 30.0–36.0)
MCV: 91.4 fL (ref 80.0–100.0)
Monocytes Absolute: 0.4 10*3/uL (ref 0.1–1.0)
Monocytes Relative: 8 %
Neutro Abs: 2.5 10*3/uL (ref 1.7–7.7)
Neutrophils Relative %: 49 %
Platelet Count: 269 10*3/uL (ref 150–400)
RBC: 4.09 MIL/uL (ref 3.87–5.11)
RDW: 13.6 % (ref 11.5–15.5)
WBC Count: 5.3 10*3/uL (ref 4.0–10.5)
nRBC: 0 % (ref 0.0–0.2)

## 2021-05-08 LAB — CMP (CANCER CENTER ONLY)
ALT: 12 U/L (ref 0–44)
AST: 18 U/L (ref 15–41)
Albumin: 4.2 g/dL (ref 3.5–5.0)
Alkaline Phosphatase: 63 U/L (ref 38–126)
Anion gap: 7 (ref 5–15)
BUN: 20 mg/dL (ref 8–23)
CO2: 28 mmol/L (ref 22–32)
Calcium: 10.2 mg/dL (ref 8.9–10.3)
Chloride: 103 mmol/L (ref 98–111)
Creatinine: 1.13 mg/dL — ABNORMAL HIGH (ref 0.44–1.00)
GFR, Estimated: 49 mL/min — ABNORMAL LOW (ref >60)
Glucose, Bld: 190 mg/dL — ABNORMAL HIGH (ref 70–99)
Potassium: 3.5 mmol/L (ref 3.5–5.1)
Sodium: 138 mmol/L (ref 135–145)
Total Bilirubin: 0.7 mg/dL (ref 0.3–1.2)
Total Protein: 7.4 g/dL (ref 6.5–8.1)

## 2021-05-08 MED ORDER — DENOSUMAB 60 MG/ML ~~LOC~~ SOSY
60.0000 mg | PREFILLED_SYRINGE | Freq: Once | SUBCUTANEOUS | Status: AC
  Administered 2021-05-08: 60 mg via SUBCUTANEOUS

## 2021-05-08 MED ORDER — ROSUVASTATIN CALCIUM 20 MG PO TABS: 20.0000 mg | ORAL_TABLET | Freq: Every day | ORAL | 1 refills | Status: DC

## 2021-05-08 NOTE — Assessment & Plan Note (Signed)
[name] [name] is an 81 year old woman with h/o right sided triple negative breast cancer diagnosed in 2002 s/p lumpectomy, CMF and adjuvant radiation.  Local recurrence in 2004 treated with mastectomy and reconstruction, adjuvant chemotherapy and left sided breast cancer diagnosed in 08/2016 treated with lumpectomy, adjuvant chemotherapy, adjuvant radiation therapy, and antiestrogen therapy with Anastrozole beginning in 2019.   ? ?1. Left sided ER positive breast cancer: [name] has no clinical or radiographic sign of breast cancer recurrence.  She will continue Anastrozole daily. ? ?2.  Her most recent bone density showed osteoporosis.   She continues on Prolia.  The breast center did not schedule her bone density testing. I placed orders for this to be completed at [facility].   ? ?3. Health maintenance:  I recommended healthy diet and exercise, that she continue to f/u with her pcp regularly. ? ?She noted some dizziness today and fluctuations with her BP.  I suggested she f/u with Dr. Alain Marion about this. ?

## 2021-05-08 NOTE — Progress Notes (Signed)
[facility] up: ?  ? ?Plotnikov, [name] Lacks, MD ?[place] [address] ? ? ?DIAGNOSIS:  Cancer Staging  ?Malignant neoplasm of upper-outer quadrant of left breast in female, estrogen receptor positive ([facility]) ?Staging form: Breast, AJCC 8th Edition ?- Pathologic: Stage IB (pT1b, pN0, cM0, G3, ER-, PR-, HER2-) - Unsigned ?Histologic grading system: 3 grade system ? ? ?SUMMARY OF ONCOLOGIC HISTORY: ?[name] [name]  ?  ?(1) status post right lumpectomy and sentinel lymph node biopsy in September 2002 for multifocal breast carcinoma, triple negative.  Treated with CMF followed by radiation.  ?  ?(2) Local recurrence in April 2004, status post right modified radical mastectomy with TRAM reconstruction for what proved to be a T1c N0, stage IA triple negative breast carcinoma.  Treated adjuvantly with paclitaxel and doxorubicin x4 in 2004.  Off treatment since August 2004 with no evidence of recurrence ?  ?(3) history of papillary thyroid cancer s/p thyroidectomy June 2007, s/p radioactive iodine July 2007 ?  ?(4) status post left breast upper outer quadrant biopsy 08/21/2016 for a clinical T1b N0, stage IB invasive ductal carcinoma, grade 3, weakly estrogen receptor positive, progesterone receptor and HER-2 negative, with an MIB-1 of 80%. ?  ?(5) left lumpectomy and sentinel lymph node sampling 09/11/2016 found a pT1b pN0, stage IB invasive ductal carcinoma, grade 3, with negative margins. ?  ?(6) adjuvant chemotherapy consisting of carboplatin and gemcitabine given days 1 and 8 of each 21 day cycle ?4 cycles, started 09/18/2016 (Neupogen on days 2 and 3, and Onpro on day 8 for chemotherapy induced neutropenia), last dose December 11, 2016 ?  ?(7) adjuvant radiation completed 02/14/2017 ?Site/dose:   Left breast/ 2.5 Gy x 41f      ?            Boost/ 2.5Gy x 333f?  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*) ?(a) there were no deleterious mutations in ALK, APC, ATM, AXIN2, BAP1, BARD1, BLM, BMPR1A, BRCA1, BRCA2, BRIP1, CASR, CDC73, CDH1, CDK4, CDKN1B, CDKN1C, CDKN2A, CEBPA, CHEK2, CTNNA1, DICER1, DIS3L2, EGFR, EPCAM, FH, FLCN, GATA2, GPC3, GREM1, HOXB13, HRAS, KIT, MAX, MEN1, MET, MITF, MLH1, MSH2, MSH3, MSH6, MUTYH, NBN, NF1, NF2, NTHL1, PALB2, PDGFRA, PHOX2B, PMS2, POLD1, POLE, POT1, PRKAR1A, PTCH1, PTEN, RAD50, RAD51C, RAD51D, RB1, RECQL4, RET, RUNX1, SDHA, SDHAF2, SDHB, SDHC, SDHD, SMAD4, SMARCA4, SMARCB1, SMARCE1, STK11, SUFU, TERC, TERT, TMEM127, TP53, TSC1, TSC2, VHL, WRN, WT1 ?(b) while homozygous mutations in the NPH L1 gene are associated with autosomal recessive polyposis, there is no evidence to suggest that a single mutation in this gene increases cancer risk. ?  ?(9) started anastrozole 03/14/2017 ?(a) bone density on 05/06/2017 showed a T score of -3.4--osteoporosis ?(b) started denosumab/Prolia June 2019, to be repeated every 6 months ? ?CURRENT THERAPY: Anastrozole and Prolia ? ?INTERVAL HISTORY: ?[name] CORAL89.o. female returns for follow-up of her history of left-sided estrogen positive breast cancer.  Her most recent left breast screening mammogram was completed on August 09, 2020 which showed no mammographic evidence of malignancy and breast density category B. ? ?She is taking anastrozole daily and denies any hot flashes, vaginal dryness or arthralgias. ? ? ?Patient Active Problem List  ? Diagnosis Date Noted  ? Palpitations 02/19/2021  ? DOE (dyspnea on exertion) 11/16/2020  ? Chronic sinusitis 11/16/2020  ? Upper respiratory infection 09/28/2020  ? Retinal artery occlusion, branch, right  08/09/2020  ? History of TIA (transient ischemic attack) 08/09/2020  ? Hyperparathyroidism ([place]) 06/13/2020  ? Skin lesion 03/21/2020  ? Cerebrovascular disease 03/21/2020  ? Pruritus 03/14/2020  ? Ataxia 07/20/2019  ? Seroma of breast 07/20/2019  ?  Cerumen impaction 03/23/2019  ? Wrist pain 03/23/2019  ? Right ear pain 06/27/2018  ? Coronary artery disease 01/20/2018  ? Breast cyst, left 09/17/2017  ? Aortic atherosclerosis ([place]) 07/28/2017  ? Osteoporosis 05/07/2017  ? Genetic testing 12/05/2016  ? Family history of breast cancer   ? Port catheter in place 10/23/2016  ? Encounter for antineoplastic chemotherapy 10/23/2016  ? Malignant neoplasm of upper-outer quadrant of left breast in female, estrogen receptor positive ([name] [name]) 07/29/2016  ? Well adult exam 06/05/2015  ? Neoplasm of uncertain behavior of skin 06/05/2015  ? Adenopathy, cervical 06/05/2015  ? Sinus tachycardia 06/07/2014  ? Wart viral 10/18/2011  ? Hyperglycemia 11/06/2010  ? Actinic keratoses 05/25/2010  ? Essential hypertension 02/23/2010  ? SHOULDER PAIN 11/22/2009  ? Chronic fatigue 11/08/2009  ? RLQ PAIN 11/08/2009  ? VERTIGO 07/11/2009  ? ECZEMA 10/19/2008  ? CT, CHEST, ABNORMAL 05/30/2008  ? STYE 02/17/2008  ? B12 deficiency 12/16/2007  ? Vitamin D deficiency 12/16/2007  ? HYPOTHYROIDISM, POSTSURGICAL 12/01/2007  ? Acute maxillary sinusitis 11/11/2007  ? ALLERGIC RESPIRATORY DISEASE, EXTRINSIC 11/11/2007  ? THYROID CANCER 08/19/2007  ? Dyslipidemia 08/19/2007  ? Anxiety disorder 08/19/2007  ? Situational depression 08/19/2007  ? Allergic rhinitis 08/19/2007  ? ? ?is allergic to pneumococcal vaccines, sulfa antibiotics, sulfacetamide sodium-sulfur, sulfamethoxazole-trimethoprim, tape, and tegaderm ag mesh [silver]. ? ?MEDICAL HISTORY: ?Past Medical History:  ?Diagnosis Date  ? Allergic rhinitis   ? Anxiety   ? Breast cancer ([place]) hx 2004  ? recurrent 2006 DR Magrinat  ? Family history of breast cancer   ? Genetic testing 12/05/2016  ? Multi-Cancer panel (83 genes) @ [facility] - Monoallelic mutation in [place] (carrier)  ? HTN (hypertension)   ? Hyperlipidemia   ? Hypothyroidism   ? Personal history of chemotherapy 2002  ? Personal history of radiation therapy 2002  ? Psoriasis   ? S/P  thyroidectomy 07/2005  ? 2 cm largest diameter (1 other tiny focus)/ i-131 rx 99 [facility] 08/2005  ? Thyroid cancer ([place])   ? Papillary Stage 1 - Dr Loanne Drilling  ? Vitamin B12 deficiency 2009  ? Vitamin D deficiency 2009  ? ? ?SURGICAL HISTORY: ?Past Surgical History:  ?Procedure Laterality Date  ? BREAST LUMPECTOMY    ? BREAST LUMPECTOMY WITH RADIOACTIVE SEED AND SENTINEL LYMPH NODE BIOPSY Left 09/11/2016  ? Procedure: LEFT BREAST LUMPECTOMY WITH RADIOACTIVE SEED AND LEFT AXILLARY SENTINEL LYMPH NODE BIOPSY WITH BLUE DYE INJECTION;  Surgeon: [name], MD;  Location: [facility];  Service: General;  Laterality: Left;  ? IR FLUORO GUIDE PORT INSERTION RIGHT  09/13/2016  ? IR REMOVAL TUN ACCESS W/ PORT W/O FL MOD SED  12/30/2016  ? IR US GUIDE VASC ACCESS RIGHT  09/13/2016  ? MASTECTOMY Right   ? Right  ? PORTACATH PLACEMENT N/A 09/11/2016  ? Procedure: ATTEMPTED INSERTION PORT-A-CATH WITH ULTRA SOUND GUIDANCE;  Surgeon: [name], MD;  Location: [facility];  Service: General;  Laterality: N/A;  ? REDUCTION MAMMAPLASTY Left 2005  ? THYROIDECTOMY  2007  ? ? ?SOCIAL HISTORY: ?Social History  ? ?Socioeconomic History  ? Marital status: Married  ?  Spouse name: Not on file  ? Number of children: 2  ? Years of education: Not on  file  ? Highest education level: Not on file  ?Occupational History  ? 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  Employer: RETIRED  ?Tobacco Use  ? Smoking status: Never  ? Smokeless tobacco: Never  ?Substance and Sexual Activity  ? Alcohol use: No  ? Drug use: No  ? Sexual activity: Not Currently  ?Other Topics Concern  ? Not on file  ?Social History Narrative  ? GI - Dr Earlean Shawl  ? Depression - Dr Toy Care  ? GYN - Dr Marianna Payment  ?   ? ?Social Determinants of Health  ? ?Financial Resource Strain: Not on file  ?Food Insecurity: Not on file  ?Transportation Needs: Not on file  ?Physical Activity: Not on file  ?Stress: Not on file  ?Social Connections:  Not on file  ?Intimate Partner Violence: Not on file  ? ? ?FAMILY HISTORY: ?Family History  ?Problem Relation Age of Onset  ? Stroke Mother   ? Allergies Mother   ? Asthma Mother   ? Clotting disorder Mother   ? Heart dis

## 2021-05-09 ENCOUNTER — Telehealth: Payer: Self-pay | Admitting: Adult Health

## 2021-05-09 NOTE — Telephone Encounter (Signed)
Scheduled appointment per 3/28 los. Patient is aware of the appointments. ?

## 2021-05-13 ENCOUNTER — Other Ambulatory Visit: Payer: Self-pay | Admitting: Internal Medicine

## 2021-05-13 DIAGNOSIS — I10 Essential (primary) hypertension: Secondary | ICD-10-CM

## 2021-05-16 ENCOUNTER — Ambulatory Visit (HOSPITAL_BASED_OUTPATIENT_CLINIC_OR_DEPARTMENT_OTHER): Payer: Medicare Other

## 2021-05-22 ENCOUNTER — Other Ambulatory Visit (HOSPITAL_BASED_OUTPATIENT_CLINIC_OR_DEPARTMENT_OTHER): Payer: Medicare Other

## 2021-05-29 ENCOUNTER — Other Ambulatory Visit (HOSPITAL_BASED_OUTPATIENT_CLINIC_OR_DEPARTMENT_OTHER): Payer: Medicare Other

## 2021-06-18 ENCOUNTER — Encounter: Payer: Self-pay | Admitting: Internal Medicine

## 2021-06-18 ENCOUNTER — Ambulatory Visit (INDEPENDENT_AMBULATORY_CARE_PROVIDER_SITE_OTHER): Payer: Medicare Other | Admitting: Internal Medicine

## 2021-06-18 ENCOUNTER — Ambulatory Visit (INDEPENDENT_AMBULATORY_CARE_PROVIDER_SITE_OTHER): Payer: Medicare Other

## 2021-06-18 VITALS — BP 130/80 | HR 90 | Temp 98.6°F | Ht 63.0 in | Wt 144.0 lb

## 2021-06-18 DIAGNOSIS — E213 Hyperparathyroidism, unspecified: Secondary | ICD-10-CM

## 2021-06-18 DIAGNOSIS — C50412 Malignant neoplasm of upper-outer quadrant of left female breast: Secondary | ICD-10-CM

## 2021-06-18 DIAGNOSIS — R5382 Chronic fatigue, unspecified: Secondary | ICD-10-CM | POA: Diagnosis not present

## 2021-06-18 DIAGNOSIS — I2510 Atherosclerotic heart disease of native coronary artery without angina pectoris: Secondary | ICD-10-CM | POA: Diagnosis not present

## 2021-06-18 DIAGNOSIS — Z0000 Encounter for general adult medical examination without abnormal findings: Secondary | ICD-10-CM

## 2021-06-18 DIAGNOSIS — I10 Essential (primary) hypertension: Secondary | ICD-10-CM

## 2021-06-18 DIAGNOSIS — F419 Anxiety disorder, unspecified: Secondary | ICD-10-CM

## 2021-06-18 DIAGNOSIS — Z170 Estrogen receptor positive status [ER+]: Secondary | ICD-10-CM

## 2021-06-18 DIAGNOSIS — E559 Vitamin D deficiency, unspecified: Secondary | ICD-10-CM

## 2021-06-18 DIAGNOSIS — E538 Deficiency of other specified B group vitamins: Secondary | ICD-10-CM

## 2021-06-18 LAB — HEMOGLOBIN A1C: Hgb A1c MFr Bld: 6.4 % (ref 4.6–6.5)

## 2021-06-18 MED ORDER — VENLAFAXINE HCL ER 150 MG PO CP24: 150.0000 mg | ORAL_CAPSULE | Freq: Every day | ORAL | 1 refills | Status: DC

## 2021-06-18 NOTE — Progress Notes (Signed)
? ?Subjective:  ?Patient ID: [name] [name], female    DOB: [birth year]  Age: 81 y.o. MRN: [id number] ? ?CC: No chief complaint on file. ? ? ?HPI ?CLARETHA TOWNSHEND presents for grief: sister died in 22-May-2021. ?F/u on depression, anxiety - worse ?F/u on hyperparathyroidism ?C/o elevated CBG ? ?Outpatient Medications Prior to Visit  ?Medication Sig Dispense Refill  ? ALPRAZolam (XANAX) 1 MG tablet TAKE 1/2 TO 1 TABLET BY MOUTH 2 TIMES DAILY AS NEEDED FOR ANXIETY 60 tablet 2  ? anastrozole (ARIMIDEX) 1 MG tablet TAKE 1 TABLET BY MOUTH EVERY DAY 90 tablet 4  ? cholecalciferol (VITAMIN D) 1000 units tablet Take 2 tablets (2,000 Units total) by mouth daily. 100 tablet 3  ? clopidogrel (PLAVIX) 75 MG tablet Take 1 tablet (75 mg total) by mouth daily. 90 tablet 3  ? levothyroxine (SYNTHROID) 112 MCG tablet TAKE 1 TABLET (112 MCG TOTAL) BY MOUTH DAILY. 90 tablet 3  ? meclizine (ANTIVERT) 25 MG tablet Take 1 tablet (25 mg total) by mouth 3 (three) times daily as needed for dizziness. 30 tablet 0  ? rosuvastatin (CRESTOR) 20 MG tablet Take 1 tablet (20 mg total) by mouth daily. 90 tablet 1  ? telmisartan (MICARDIS) 20 MG tablet Take 1 tablet (20 mg total) by mouth daily. 90 tablet 3  ? vitamin B-12 (CYANOCOBALAMIN) 1000 MCG tablet Take 1,000 mcg by mouth daily.    ? venlafaxine XR (EFFEXOR XR) 75 MG 24 hr capsule Take 1 capsule (75 mg total) by mouth daily with breakfast. 90 capsule 3  ? ?No facility-administered medications prior to visit.  ? ? ?ROS: ?Review of Systems  ?Constitutional:  Positive for fatigue. Negative for activity change, appetite change, chills and unexpected weight change.  ?HENT:  Negative for congestion, mouth sores and sinus pressure.   ?Eyes:  Negative for visual disturbance.  ?Respiratory:  Negative for cough and chest tightness.   ?Gastrointestinal:  Negative for abdominal pain and nausea.  ?Genitourinary:  Negative for difficulty urinating, frequency and vaginal pain.  ?Musculoskeletal:  Negative for  back pain and gait problem.  ?Skin:  Negative for pallor and rash.  ?Neurological:  Negative for dizziness, tremors, weakness, numbness and headaches.  ?Psychiatric/Behavioral:  Positive for dysphoric mood and sleep disturbance. Negative for confusion and suicidal ideas. The patient is nervous/anxious.   ? ?Objective:  ?BP 130/80 (BP Location: Left Arm, Patient Position: Sitting, Cuff Size: Normal)   Pulse 90   Temp 98.6 ?F (37 ?C) (Oral)   Ht '5\' 3"'$  (1.6 m)   Wt 144 lb (65.3 kg)   SpO2 97%   BMI 25.51 kg/m?  ? ?BP Readings from Last 3 Encounters:  ?06/18/21 130/80  ?05/08/21 119/76  ?04/26/21 130/90  ? ? ?Wt Readings from Last 3 Encounters:  ?06/18/21 144 lb (65.3 kg)  ?05/08/21 143 lb 6.4 oz (65 kg)  ?04/26/21 144 lb (65.3 kg)  ? ? ?Physical Exam ?Constitutional:   ?   General: She is not in acute distress. ?   Appearance: Normal appearance. She is well-developed.  ?HENT:  ?   Head: Normocephalic.  ?   Right Ear: External ear normal.  ?   Left Ear: External ear normal.  ?   Nose: Nose normal.  ?Eyes:  ?   General:     ?   Right eye: No discharge.     ?   Left eye: No discharge.  ?   Conjunctiva/sclera: Conjunctivae normal.  ?   Pupils: Pupils are equal,  round, and reactive to light.  ?Neck:  ?   Thyroid: No thyromegaly.  ?   Vascular: No JVD.  ?   Trachea: No tracheal deviation.  ?Cardiovascular:  ?   Rate and Rhythm: Normal rate and regular rhythm.  ?   Heart sounds: Normal heart sounds.  ?Pulmonary:  ?   Effort: No respiratory distress.  ?   Breath sounds: No stridor. No wheezing.  ?Abdominal:  ?   General: Bowel sounds are normal. There is no distension.  ?   Palpations: Abdomen is soft. There is no mass.  ?   Tenderness: There is no abdominal tenderness. There is no guarding or rebound.  ?Musculoskeletal:     ?   General: Tenderness present.  ?   Cervical back: Normal range of motion and neck supple. No rigidity.  ?Lymphadenopathy:  ?   Cervical: No cervical adenopathy.  ?Skin: ?   Findings: No erythema  or rash.  ?Neurological:  ?   Mental Status: She is oriented to person, place, and time.  ?   Cranial Nerves: No cranial nerve deficit.  ?   Motor: No abnormal muscle tone.  ?   Coordination: Coordination normal.  ?   Deep Tendon Reflexes: Reflexes normal.  ?Psychiatric:     ?   Behavior: Behavior normal.     ?   Thought Content: Thought content normal.     ?   Judgment: Judgment normal.  ? ? ?Lab Results  ?Component Value Date  ? WBC 5.3 05/08/2021  ? HGB 12.0 05/08/2021  ? HCT 37.4 05/08/2021  ? PLT 269 05/08/2021  ? GLUCOSE 190 (H) 05/08/2021  ? CHOL 134 08/10/2020  ? TRIG 263 (H) 08/10/2020  ? HDL 37 (L) 08/10/2020  ? LDLDIRECT 125.0 06/05/2015  ? [place] 44 08/10/2020  ? ALT 12 05/08/2021  ? AST 18 05/08/2021  ? NA 138 05/08/2021  ? K 3.5 05/08/2021  ? CL 103 05/08/2021  ? CREATININE 1.13 (H) 05/08/2021  ? BUN 20 05/08/2021  ? CO2 28 05/08/2021  ? TSH 0.87 03/14/2020  ? INR 1.0 08/21/2020  ? HGBA1C 6.4 (H) 08/10/2020  ? ? ?MR BRAIN W WO CONTRAST ? ?Result Date: 08/21/2020 ?CLINICAL DATA:  Dizziness EXAM: MRI HEAD WITHOUT AND WITH CONTRAST TECHNIQUE: Multiplanar, multiecho pulse sequences of the brain and surrounding structures were obtained without and with intravenous contrast. CONTRAST:  6.67m GADAVIST GADOBUTROL 1 MMOL/ML IV SOLN COMPARISON:  08/09/2020 FINDINGS: Brain: There is no acute infarction or intracranial hemorrhage. There is no intracranial mass, mass effect, or edema. There is no hydrocephalus or extra-axial fluid collection. Prominence of the ventricles and sulci reflects generalized parenchymal volume loss. Confluent areas of T2 hyperintensity in the supratentorial greater than pontine white matter nonspecific but probably reflect advanced chronic microvascular ischemic changes. No abnormal enhancement. Vascular: Major vessel flow voids at the skull base are preserved. Skull and upper cervical spine: Normal marrow signal is preserved. Sinuses/Orbits: Mild mucosal thickening.  Orbits are  unremarkable. Other: Sella is unremarkable.  Mastoid air cells are clear. IMPRESSION: No evidence of recent infarction, hemorrhage, or mass. Advanced chronic microvascular ischemic changes. No substantial change since recent prior study. Electronically Signed   By: PMacy MisM.D.   On: 08/21/2020 16:23  ? ?CT HEAD CODE STROKE WO CONTRAST ? ?Result Date: 08/21/2020 ?CLINICAL DATA:  Code stroke. Dizziness. Deviating to the left side with walking. Last seen normal 0930 hours. EXAM: CT HEAD WITHOUT CONTRAST TECHNIQUE: Contiguous axial images were obtained from  the base of the skull through the vertex without intravenous contrast. COMPARISON:  Brain MRI 08/09/2020.  Head CT 03/17/2020. FINDINGS: Brain: Age related atrophy. Chronic small-vessel ischemic changes of the pons. No focal cerebellar finding. Confluent chronic small vessel ischemic changes throughout the hemispheric white matter. No cortical or large vessel territory infarction. No mass lesion, hemorrhage, hydrocephalus or extra-axial collection. Vascular: No abnormal vascular finding. Skull: Negative Sinuses/Orbits: Clear/normal Other: None ASPECTS ([place] Stroke Program Early CT Score) - Ganglionic level infarction (caudate, lentiform nuclei, internal capsule, insula, M1-M3 cortex): 7 - Supraganglionic infarction (M4-M6 cortex): 3 Total score (0-10 with 10 being normal): 10 IMPRESSION: 1. Atrophy with chronic small-vessel ischemic changes, extensive throughout the cerebral hemispheric white matter. No acute finding by CT. 2. ASPECTS is 10 3. These results were communicated to Dr. Erlinda Hong at 1:41 pm on 08/21/2020 by text page via the [facility] messaging system. Electronically Signed   By: Nelson Chimes M.D.   On: 08/21/2020 13:42  ? ? ?Assessment & Plan:  ? ?Problem List Items Addressed This Visit   ? ? Hyperparathyroidism ([place]) - Primary  ? Relevant Orders  ? Comprehensive metabolic panel  ? Hemoglobin A1c  ? PTH, intact and calcium  ? B12 deficiency  ?  Cont w/Vit  B12 ? ?  ?  ? Vitamin D deficiency  ?  Cont w/Vit D ? ?  ?  ? Anxiety disorder  ?  Grieving. ?Cont w/Xanax prn ? Potential benefits of a long term benzodiazepines  use as well as potential risks  and complications were

## 2021-06-18 NOTE — Assessment & Plan Note (Signed)
No change 

## 2021-06-18 NOTE — Assessment & Plan Note (Addendum)
Grieving. ?Cont w/Xanax prn ? Potential benefits of a long term benzodiazepines  use as well as potential risks  and complications were explained to the patient and were aknowledged. ?Take Xanax twice a day if needed ?Reduce Effexor to 37.5 mg/d - d/c ?Increase Effexor if tolerated ?Psychology ref is offered ?Arman Filter was offered ?Worse - will increase Effexor XR to 150 mg/d ?

## 2021-06-18 NOTE — Assessment & Plan Note (Signed)
Crestor, BP control and ASA ?

## 2021-06-18 NOTE — Assessment & Plan Note (Signed)
Cont w/Vit B12 

## 2021-06-18 NOTE — Progress Notes (Addendum)
? ?Subjective:  ? [name] [name] is a 81 y.o. female who presents for Medicare Annual (Subsequent) preventive examination. ? ? ?I connected with [name] [name]  today by telephone and verified that I am speaking with the correct person using two identifiers. ?Location patient: home ?Location provider: work ?Persons participating in the virtual visit: patient, provider. ?  ?I discussed the limitations, risks, security and privacy concerns of performing an evaluation and management service by telephone and the availability of in person appointments. I also discussed with the patient that there may be a patient responsible charge related to this service. The patient expressed understanding and verbally consented to this telephonic visit.  ?  ?Interactive audio and video telecommunications were attempted between this provider and patient, however failed, due to patient having technical difficulties OR patient did not have access to video capability.  We continued and completed visit with audio only. ? ?  ?Review of Systems    ? ?Cardiac Risk Factors include: advanced age (>25mn, >>70women) ? ?   ?Objective:  ?  ?Today's Vitals  ? ?There is no height or weight on file to calculate BMI. ? ? ?  06/18/2021  ? 10:03 AM 06/18/2021  ? 10:02 AM 10/12/2020  ?  1:55 PM 09/13/2020  ? 11:23 AM 08/21/2020  ?  1:52 PM 08/10/2020  ?  1:00 AM 08/09/2020  ? 12:07 PM  ?Advanced Directives  ?Does Patient Have a Medical Advance Directive?  Yes No Yes No No No  ?Type of [place]  Living will     ?Does patient want to make changes to medical advance directive?    No - Patient declined     ?Copy of [place] Chart? No - copy requested No - copy requested       ?Would patient like information on creating a medical advance directive?   No - Patient declined   No - Patient declined   ? ? ?Current Medications (verified) ?Outpatient Encounter  Medications as of 06/18/2021  ?Medication Sig  ? ALPRAZolam (XANAX) 1 MG tablet TAKE 1/2 TO 1 TABLET BY MOUTH 2 TIMES DAILY AS NEEDED FOR ANXIETY  ? anastrozole (ARIMIDEX) 1 MG tablet TAKE 1 TABLET BY MOUTH EVERY DAY  ? cholecalciferol (VITAMIN D) 1000 units tablet Take 2 tablets (2,000 Units total) by mouth daily.  ? clopidogrel (PLAVIX) 75 MG tablet Take 1 tablet (75 mg total) by mouth daily.  ? levothyroxine (SYNTHROID) 112 MCG tablet TAKE 1 TABLET (112 MCG TOTAL) BY MOUTH DAILY.  ? rosuvastatin (CRESTOR) 20 MG tablet Take 1 tablet (20 mg total) by mouth daily.  ? telmisartan (MICARDIS) 20 MG tablet Take 1 tablet (20 mg total) by mouth daily.  ? venlafaxine XR (EFFEXOR XR) 75 MG 24 hr capsule Take 1 capsule (75 mg total) by mouth daily with breakfast.  ? vitamin B-12 (CYANOCOBALAMIN) 1000 MCG tablet Take 1,000 mcg by mouth daily.  ? meclizine (ANTIVERT) 25 MG tablet Take 1 tablet (25 mg total) by mouth 3 (three) times daily as needed for dizziness. (Patient not taking: Reported on 06/18/2021)  ? ?No facility-administered encounter medications on file as of 06/18/2021.  ? ? ?Allergies (verified) ?Pneumococcal vaccines, Sulfa antibiotics, Sulfacetamide sodium-sulfur, Sulfamethoxazole-trimethoprim, Tape, and Tegaderm ag mesh [silver]  ? ?History: ?Past Medical History:  ?Diagnosis Date  ? Allergic rhinitis   ? Anxiety   ? Breast cancer ([place] 2004  ? recurrent 2006  DR Magrinat  ? Family history of breast cancer   ? Genetic testing 12/05/2016  ? 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 HTN (hypertension)   ? Hyperlipidemia   ? Hypothyroidism   ? Personal history of chemotherapy 2002  ? Personal history of radiation therapy 2002  ? Psoriasis   ? S/P thyroidectomy 07/2005  ? 2 cm largest diameter (1 other tiny focus)/ i-131 rx 99 [facility] 08/2005  ? Thyroid cancer ([place])   ? Papillary Stage 1 - Dr Loanne Drilling  ? Vitamin B12 deficiency 2009  ? Vitamin D deficiency 2009  ? ?Past Surgical  History:  ?Procedure Laterality Date  ? BREAST LUMPECTOMY    ? BREAST LUMPECTOMY WITH RADIOACTIVE SEED AND SENTINEL LYMPH NODE BIOPSY Left 09/11/2016  ? Procedure: LEFT BREAST LUMPECTOMY WITH RADIOACTIVE SEED AND LEFT AXILLARY SENTINEL LYMPH NODE BIOPSY WITH BLUE DYE INJECTION;  Surgeon: [name], MD;  Location: [facility];  Service: General;  Laterality: Left;  ? IR FLUORO GUIDE PORT INSERTION RIGHT  09/13/2016  ? IR REMOVAL TUN ACCESS W/ PORT W/O FL MOD SED  12/30/2016  ? IR US GUIDE VASC ACCESS RIGHT  09/13/2016  ? MASTECTOMY Right   ? Right  ? PORTACATH PLACEMENT N/A 09/11/2016  ? Procedure: ATTEMPTED INSERTION PORT-A-CATH WITH ULTRA SOUND GUIDANCE;  Surgeon: [name], MD;  Location: [facility];  Service: General;  Laterality: N/A;  ? REDUCTION MAMMAPLASTY Left 2005  ? THYROIDECTOMY  2007  ? ?Family History  ?Problem Relation Age of Onset  ? Stroke Mother   ? Allergies Mother   ? Asthma Mother   ? Clotting disorder Mother   ? Heart disease Father 46  ?     MI  ? Allergies Sister   ? Asthma Sister   ? Breast cancer Sister 12  ?     Lymphoma 75; currently 76  ? Lymphoma Sister   ? Hyperparathyroidism Sister   ? Asthma Brother   ? Leukemia Brother   ?     dx 3s  ? Allergies Daughter   ? Asthma Daughter   ? Allergies Sister   ? Breast cancer Sister 35  ?     currently 80  ? Kidney cancer Paternal Aunt   ?     kidney ca; deceased 51  ? Liver cancer Paternal Uncle   ?     unk. primary ("liver")  ? Throat cancer Maternal Grandmother   ?     deceased 28  ? Lung cancer Maternal Grandfather   ?     deceased 31  ? Pancreatic cancer Paternal Grandfather   ?     deceased 24  ? Cancer Paternal Aunt   ?     "abdominal"; deceased 13  ? ?Social History  ? ?Socioeconomic History  ? Marital status: Married  ?  Spouse name: Not on file  ? Number of children: 2  ? Years of education: Not on file  ? Highest education level: Not on file  ?Occupational History  ? 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  Employer: RETIRED  ?Tobacco Use  ? Smoking status: Never  ? Smokeless tobacco: Never  ?Substance and Sexual Activity  ? Alcohol use: No  ? Drug use: No  ? Sexual activity: Not Currently  ?Other Topics Concern  ? Not on file  ?Social History Narrative  ? GI - Dr Earlean Shawl  ? Depression -  Dr Toy Care  ? GYN - Dr Marianna Payment  ?   ? ?Social Determinants of Health  ? ?Financial Resource Strain: Low Risk   ? Difficulty of Paying Living Expenses: Not hard at all  ?Food Insecurity: No Food Insecurity  ? Worried About Charity fundraiser in the Last Year: Never true  ? Ran Out of Food in the Last Year: Never true  ?Transportation Needs: No Transportation Needs  ? Lack of Transportation (Medical): No  ? Lack of Transportation (Non-Medical): No  ?Physical Activity: Sufficiently Active  ? Days of Exercise per Week: 5 days  ? Minutes of Exercise per Session: 30 min  ?Stress: No Stress Concern Present  ? Feeling of Stress : Not at all  ?Social Connections: Moderately Integrated  ? Frequency of Communication with Friends and Family: Three times a week  ? Frequency of Social Gatherings with Friends and Family: Three times a week  ? Attends Religious Services: More than 4 times per year  ? Active Member of Clubs or Organizations: No  ? Attends Archivist Meetings: Never  ? Marital Status: Married  ? ? ?Tobacco Counseling ?Counseling given: Not Answered ? ? ?Clinical Intake: ? ?Pre-visit preparation completed: Yes ? ?Pain : No/denies pain ? ?  ? ?Nutritional Risks: None ?Diabetes: No ? ?How often do you need to have someone help you when you read instructions, pamphlets, or other written materials from your doctor or pharmacy?: 1 - Never ?What is the last grade level you completed in school?: college ? ?Diabetic?no  ? ?Interpreter Needed?: No ? ?Information entered by :: [email or web address] ? ? ?Activities of Daily Living ? ?  06/18/2021  ? 10:03 AM 08/10/2020  ?  1:00 AM  ?In your present state of  health, do you have any difficulty performing the following activities:  ?Hearing? 0 0  ?Vision? 0 1  ?Difficulty concentrating or making decisions? 0 0  ?Walking or climbing stairs? 0 0  ?Dressing or bathing? 0 0

## 2021-06-18 NOTE — Assessment & Plan Note (Signed)
F/u w/Oncology 

## 2021-06-18 NOTE — Assessment & Plan Note (Signed)
Cont w/Vit D 

## 2021-06-18 NOTE — Assessment & Plan Note (Signed)
Re-start Telmisartan at lower dose 

## 2021-06-18 NOTE — Patient Instructions (Signed)
[name] [name] , ?Thank you for taking time to come for your Medicare Wellness Visit. I appreciate your ongoing commitment to your health goals. Please review the following plan we discussed and let me know if I can assist you in the future.  ? ?Screening recommendations/referrals: ?Colonoscopy: no longer required  ?Mammogram: no longer required  ?Bone Density: 05/03/2021 ?Recommended yearly ophthalmology/optometry visit for glaucoma screening and checkup ?Recommended yearly dental visit for hygiene and checkup ? ?Vaccinations: ?Influenza vaccine: declined  ?Pneumococcal vaccine: declined  ?Tdap vaccine: 05/26/2012 ?Shingles vaccine: declined    ? ?Advanced directives: yes  ? ?Conditions/risks identified: none  ? ?Next appointment: none  ? ? ?Preventive Care 1 Years and Older, Female ?Preventive care refers to lifestyle choices and visits with your health care provider that can promote health and wellness. ?What does preventive care include? ?A yearly physical exam. This is also called an annual well check. ?Dental exams once or twice a year. ?Routine eye exams. Ask your health care provider how often you should have your eyes checked. ?Personal lifestyle choices, including: ?Daily care of your teeth and gums. ?Regular physical activity. ?Eating a healthy diet. ?Avoiding tobacco and drug use. ?Limiting alcohol use. ?Practicing safe sex. ?Taking low-dose aspirin every day. ?Taking vitamin and mineral supplements as recommended by your health care provider. ?What happens during an annual well check? ?The services and screenings done by your health care provider during your annual well check will depend on your age, overall health, lifestyle risk factors, and family history of disease. ?Counseling  ?Your health care provider may ask you questions about your: ?Alcohol use. ?Tobacco use. ?Drug use. ?Emotional well-being. ?Home and relationship well-being. ?Sexual activity. ?Eating habits. ?History of falls. ?Memory and  ability to understand (cognition). ?Work and work Statistician. ?Reproductive health. ?Screening  ?You may have the following tests or measurements: ?Height, weight, and BMI. ?Blood pressure. ?Lipid and cholesterol levels. These may be checked every 5 years, or more frequently if you are over 72 years old. ?Skin check. ?Lung cancer screening. You may have this screening every year starting at age 82 if you have a 30-pack-year history of smoking and currently smoke or have quit within the past 15 years. ?Fecal occult blood test (FOBT) of the stool. You may have this test every year starting at age 14. ?Flexible sigmoidoscopy or colonoscopy. You may have a sigmoidoscopy every 5 years or a colonoscopy every 10 years starting at age 57. ?Hepatitis C blood test. ?Hepatitis B blood test. ?Sexually transmitted disease (STD) testing. ?Diabetes screening. This is done by checking your blood sugar (glucose) after you have not eaten for a while (fasting). You may have this done every 1-3 years. ?Bone density scan. This is done to screen for osteoporosis. You may have this done starting at age 70. ?Mammogram. This may be done every 1-2 years. Talk to your health care provider about how often you should have regular mammograms. ?Talk with your health care provider about your test results, treatment options, and if necessary, the need for more tests. ?Vaccines  ?Your health care provider may recommend certain vaccines, such as: ?Influenza vaccine. This is recommended every year. ?Tetanus, diphtheria, and acellular pertussis (Tdap, Td) vaccine. You may need a Td booster every 10 years. ?Zoster vaccine. You may need this after age 31. ?Pneumococcal 13-valent conjugate (PCV13) vaccine. One dose is recommended after age 52. ?Pneumococcal polysaccharide (PPSV23) vaccine. One dose is recommended after age 24. ?Talk to your health care provider about which screenings and vaccines  you need and how often you need them. ?This information is  not intended to replace advice given to you by your health care provider. Make sure you discuss any questions you have with your health care provider. ?Document Released: 02/24/2015 Document Revised: 10/18/2015 Document Reviewed: 11/29/2014 ?Elsevier Interactive Patient Education ? 2017 [place]. ? ?Fall Prevention in the Home ?Falls can cause injuries. They can happen to people of all ages. There are many things you can do to make your home safe and to help prevent falls. ?What can I do on the outside of my home? ?Regularly fix the edges of walkways and driveways and fix any cracks. ?Remove anything that might make you trip as you walk through a door, such as a raised step or threshold. ?Trim any bushes or trees on the path to your home. ?Use bright outdoor lighting. ?Clear any walking paths of anything that might make someone trip, such as rocks or tools. ?Regularly check to see if handrails are loose or broken. Make sure that both sides of any steps have handrails. ?Any raised decks and porches should have guardrails on the edges. ?Have any leaves, snow, or ice cleared regularly. ?Use sand or salt on walking paths during winter. ?Clean up any spills in your garage right away. This includes oil or grease spills. ?What can I do in the bathroom? ?Use night lights. ?Install grab bars by the toilet and in the tub and shower. Do not use towel bars as grab bars. ?Use non-skid mats or decals in the tub or shower. ?If you need to sit down in the shower, use a plastic, non-slip stool. ?Keep the floor dry. Clean up any water that spills on the floor as soon as it happens. ?Remove soap buildup in the tub or shower regularly. ?Attach bath mats securely with double-sided non-slip rug tape. ?Do not have throw rugs and other things on the floor that can make you trip. ?What can I do in the bedroom? ?Use night lights. ?Make sure that you have a light by your bed that is easy to reach. ?Do not use any sheets or blankets that  are too big for your bed. They should not hang down onto the floor. ?Have a firm chair that has side arms. You can use this for support while you get dressed. ?Do not have throw rugs and other things on the floor that can make you trip. ?What can I do in the kitchen? ?Clean up any spills right away. ?Avoid walking on wet floors. ?Keep items that you use a lot in easy-to-reach places. ?If you need to reach something above you, use a strong step stool that has a grab bar. ?Keep electrical cords out of the way. ?Do not use floor polish or wax that makes floors slippery. If you must use wax, use non-skid floor wax. ?Do not have throw rugs and other things on the floor that can make you trip. ?What can I do with my stairs? ?Do not leave any items on the stairs. ?Make sure that there are handrails on both sides of the stairs and use them. Fix handrails that are broken or loose. Make sure that handrails are as long as the stairways. ?Check any carpeting to make sure that it is firmly attached to the stairs. Fix any carpet that is loose or worn. ?Avoid having throw rugs at the top or bottom of the stairs. If you do have throw rugs, attach them to the floor with carpet tape. ?Make sure  that you have a light switch at the top of the stairs and the bottom of the stairs. If you do not have them, ask someone to add them for you. ?What else can I do to help prevent falls? ?Wear shoes that: ?Do not have high heels. ?Have rubber bottoms. ?Are comfortable and fit you well. ?Are closed at the toe. Do not wear sandals. ?If you use a stepladder: ?Make sure that it is fully opened. Do not climb a closed stepladder. ?Make sure that both sides of the stepladder are locked into place. ?Ask someone to hold it for you, if possible. ?Clearly mark and make sure that you can see: ?Any grab bars or handrails. ?First and last steps. ?Where the edge of each step is. ?Use tools that help you move around (mobility aids) if they are needed. These  include: ?Canes. ?Walkers. ?Scooters. ?Crutches. ?Turn on the lights when you go into a dark area. Replace any light bulbs as soon as they burn out. ?Set up your furniture so you have a clear path. Avoid mo

## 2021-06-19 LAB — COMPREHENSIVE METABOLIC PANEL
ALT: 11 U/L (ref 0–35)
AST: 19 U/L (ref 0–37)
Albumin: 4.4 g/dL (ref 3.5–5.2)
Alkaline Phosphatase: 62 U/L (ref 39–117)
BUN: 21 mg/dL (ref 6–23)
CO2: 24 mEq/L (ref 19–32)
Calcium: 9.3 mg/dL (ref 8.4–10.5)
Chloride: 105 mEq/L (ref 96–112)
Creatinine, Ser: 1.76 mg/dL — ABNORMAL HIGH (ref 0.40–1.20)
GFR: 26.67 mL/min — ABNORMAL LOW (ref >60.00)
Glucose, Bld: 109 mg/dL — ABNORMAL HIGH (ref 70–99)
Potassium: 4.2 mEq/L (ref 3.5–5.1)
Sodium: 140 mEq/L (ref 135–145)
Total Bilirubin: 0.4 mg/dL (ref 0.2–1.2)
Total Protein: 7.7 g/dL (ref 6.0–8.3)

## 2021-06-25 LAB — PTH, INTACT AND CALCIUM
Calcium: 9.4 mg/dL (ref 8.6–10.4)
PTH: 156 pg/mL — ABNORMAL HIGH (ref 16–77)

## 2021-06-26 ENCOUNTER — Ambulatory Visit: Payer: Medicare Other | Admitting: Internal Medicine

## 2021-07-10 ENCOUNTER — Other Ambulatory Visit (HOSPITAL_BASED_OUTPATIENT_CLINIC_OR_DEPARTMENT_OTHER): Payer: Medicare Other

## 2021-07-10 ENCOUNTER — Other Ambulatory Visit: Payer: Self-pay | Admitting: Internal Medicine

## 2021-07-10 DIAGNOSIS — Z1231 Encounter for screening mammogram for malignant neoplasm of breast: Secondary | ICD-10-CM

## 2021-07-12 ENCOUNTER — Ambulatory Visit
Admission: EM | Admit: 2021-07-12 | Discharge: 2021-07-12 | Disposition: A | Discharge status: Home / Self Care | Payer: Medicare Other | Attending: Family Medicine | Admitting: Family Medicine

## 2021-07-12 DIAGNOSIS — R051 Acute cough: Secondary | ICD-10-CM | POA: Insufficient documentation

## 2021-07-12 DIAGNOSIS — N3090 Cystitis, unspecified without hematuria: Secondary | ICD-10-CM | POA: Diagnosis not present

## 2021-07-12 LAB — POCT URINALYSIS DIP (MANUAL ENTRY)
Bilirubin, UA: NEGATIVE
Blood, UA: NEGATIVE
Glucose, UA: NEGATIVE mg/dL
Ketones, POC UA: NEGATIVE mg/dL
Nitrite, UA: NEGATIVE
Spec Grav, UA: 1.025 (ref 1.010–1.025)
Urobilinogen, UA: 0.2 E.U./dL
pH, UA: 6 (ref 5.0–8.0)

## 2021-07-12 MED ORDER — CEPHALEXIN 250 MG PO CAPS: 250.0000 mg | ORAL_CAPSULE | Freq: Three times a day (TID) | ORAL | 0 refills | Status: AC

## 2021-07-12 NOTE — ED Provider Notes (Signed)
[name]    CSN: [id number] Arrival date & time: 07/12/21  1745      History   Chief Complaint Chief Complaint  Patient presents with   Urinary Frequency   Vaginal Itching    HPI [name] [name] is a 81 y.o. female.    Urinary Frequency  Vaginal Itching  Here for urinary frequency that is been going on for about a week.  Today she started having some dysuria.  No hematuria.  No fever or chills and no vomiting  On May 25 she began having some cough.  She states she had 1 COVID test at home that was positive and one that was negative.  She request that we retest her today  Past Medical History:  Diagnosis Date   Allergic rhinitis    Anxiety    Breast cancer ([place]) hx 2004   recurrent 2006 DR Magrinat   Family history of breast cancer    Genetic testing 12/05/2016   Multi-Cancer panel (83 genes) @ [facility] - Monoallelic mutation in [place] (carrier)   HTN (hypertension)    Hyperlipidemia    Hypothyroidism    Personal history of chemotherapy 2002   Personal history of radiation therapy 2002   Psoriasis    S/P thyroidectomy 07/2005   2 cm largest diameter (1 other tiny focus)/ i-131 rx 99 mci 08/2005   Thyroid cancer ([place])    Papillary Stage 1 - Dr Loanne Drilling   Vitamin B12 deficiency 2009   Vitamin D deficiency 2009    Patient Active Problem List   Diagnosis Date Noted   Palpitations 02/19/2021   DOE (dyspnea on exertion) 11/16/2020   Chronic sinusitis 11/16/2020   Upper respiratory infection 09/28/2020   Retinal artery occlusion, branch, right 08/09/2020   History of TIA (transient ischemic attack) 08/09/2020   Hyperparathyroidism ([place]) 06/13/2020   Skin lesion 03/21/2020   Cerebrovascular disease 03/21/2020   Pruritus 03/14/2020   Ataxia 07/20/2019   Seroma of breast 07/20/2019   Cerumen impaction 03/23/2019   Wrist pain 03/23/2019   Right ear pain 06/27/2018   Coronary artery disease 01/20/2018   Breast cyst, left 09/17/2017   Aortic  atherosclerosis ([place]) 07/28/2017   Osteoporosis 05/07/2017   Genetic testing 12/05/2016   Family history of breast cancer    Port catheter in place 10/23/2016   Encounter for antineoplastic chemotherapy 10/23/2016   Malignant neoplasm of upper-outer quadrant of left breast in female, estrogen receptor positive ([place]) 07/29/2016   Well adult exam 06/05/2015   Neoplasm of uncertain behavior of skin 06/05/2015   Adenopathy, cervical 06/05/2015   Sinus tachycardia 06/07/2014   Wart viral 10/18/2011   Hyperglycemia 11/06/2010   Actinic keratoses 05/25/2010   Essential hypertension 02/23/2010   SHOULDER PAIN 11/22/2009   Chronic fatigue 11/08/2009   RLQ PAIN 11/08/2009   VERTIGO 07/11/2009   ECZEMA 10/19/2008   CT, CHEST, ABNORMAL 05/30/2008   STYE 02/17/2008   B12 deficiency 12/16/2007   Vitamin D deficiency 12/16/2007   HYPOTHYROIDISM, POSTSURGICAL 12/01/2007   Acute maxillary sinusitis 11/11/2007   ALLERGIC RESPIRATORY DISEASE, EXTRINSIC 11/11/2007   THYROID CANCER 08/19/2007   Dyslipidemia 08/19/2007   Anxiety disorder 08/19/2007   Situational depression 08/19/2007   Allergic rhinitis 08/19/2007    Past Surgical History:  Procedure Laterality Date   BREAST LUMPECTOMY     BREAST LUMPECTOMY WITH RADIOACTIVE SEED AND SENTINEL LYMPH NODE BIOPSY Left 09/11/2016   Procedure: LEFT BREAST LUMPECTOMY WITH RADIOACTIVE SEED AND LEFT AXILLARY SENTINEL LYMPH NODE BIOPSY WITH  BLUE DYE INJECTION;  Surgeon: [name], MD;  Location: [facility];  Service: General;  Laterality: Left;   IR FLUORO GUIDE PORT INSERTION RIGHT  09/13/2016   IR REMOVAL TUN ACCESS W/ PORT W/O FL MOD SED  12/30/2016   IR US GUIDE VASC ACCESS RIGHT  09/13/2016   MASTECTOMY Right    Right   PORTACATH PLACEMENT N/A 09/11/2016   Procedure: ATTEMPTED INSERTION PORT-A-CATH WITH ULTRA SOUND GUIDANCE;  Surgeon: [name], MD;  Location: [facility];  Service: General;  Laterality:  N/A;   REDUCTION MAMMAPLASTY Left 2005   THYROIDECTOMY  2007    OB History   No obstetric history on file.      Home Medications    Prior to Admission medications   Medication Sig Start Date End Date Taking? Authorizing Provider  cephALEXin (KEFLEX) 250 MG capsule Take 1 capsule (250 mg total) by mouth 3 (three) times daily for 7 days. 07/12/21 07/19/21 Yes Adria Costley, Gwenlyn Perking, MD  ALPRAZolam Duanne Moron) 1 MG tablet TAKE 1/2 TO 1 TABLET BY MOUTH 2 TIMES DAILY AS NEEDED FOR ANXIETY 04/29/21   Plotnikov, Evie Lacks, MD  anastrozole (ARIMIDEX) 1 MG tablet TAKE 1 TABLET BY MOUTH EVERY DAY 11/13/20   Magrinat, [name], MD  cholecalciferol (VITAMIN D) 1000 units tablet Take 2 tablets (2,000 Units total) by mouth daily. 06/19/17   Plotnikov, Evie Lacks, MD  clopidogrel (PLAVIX) 75 MG tablet Take 1 tablet (75 mg total) by mouth daily. 09/28/20   Plotnikov, Evie Lacks, MD  levothyroxine (SYNTHROID) 112 MCG tablet TAKE 1 TABLET (112 MCG TOTAL) BY MOUTH DAILY. 10/26/20   Plotnikov, Evie Lacks, MD  meclizine (ANTIVERT) 25 MG tablet Take 1 tablet (25 mg total) by mouth 3 (three) times daily as needed for dizziness. Patient not taking: Reported on 07/12/2021 11/20/20   Plotnikov, Evie Lacks, MD  rosuvastatin (CRESTOR) 20 MG tablet Take 1 tablet (20 mg total) by mouth daily. 05/08/21   Gardenia Phlegm, NP  telmisartan (MICARDIS) 20 MG tablet Take 1 tablet (20 mg total) by mouth daily. 04/26/21   Plotnikov, Evie Lacks, MD  venlafaxine XR (EFFEXOR XR) 150 MG 24 hr capsule Take 1 capsule (150 mg total) by mouth daily with breakfast. 06/18/21   Plotnikov, Evie Lacks, MD  vitamin B-12 (CYANOCOBALAMIN) 1000 MCG tablet Take 1,000 mcg by mouth daily.    [provider]    Family History Family History  Problem Relation Age of Onset   Stroke Mother    Allergies Mother    Asthma Mother    Clotting disorder Mother    Heart disease Father 13       MI   Allergies Sister    Asthma Sister    Breast cancer Sister 48        Lymphoma 35; currently 40   Lymphoma Sister    Hyperparathyroidism Sister    Asthma Brother    Leukemia Brother        dx 70s   Allergies Daughter    Asthma Daughter    Allergies Sister    Breast cancer Sister 66       currently 71   Kidney cancer Paternal Aunt        kidney ca; deceased 77   Liver cancer Paternal Uncle        unk. primary ("liver")   Throat cancer Maternal Grandmother        deceased 64   Lung cancer Maternal Grandfather  deceased 73   Pancreatic cancer Paternal Grandfather        deceased 33   Cancer Paternal Aunt        "abdominal"; deceased 27    Social History Social History   Tobacco Use   Smoking status: Never   Smokeless tobacco: Never  Substance Use Topics   Alcohol use: No   Drug use: No     Allergies   Pneumococcal vaccines, Sulfa antibiotics, Sulfacetamide sodium-sulfur, Sulfamethoxazole-trimethoprim, Tape, and Tegaderm ag mesh [silver]   Review of Systems Review of Systems  Genitourinary:  Positive for frequency.    Physical Exam Triage Vital Signs ED Triage Vitals  Enc Vitals Group     BP 07/12/21 1848 116/74     Pulse Rate 07/12/21 1848 97     Resp 07/12/21 1848 18     Temp 07/12/21 1848 (!) 97.4 F (36.3 C)     Temp Source 07/12/21 1848 Oral     SpO2 07/12/21 1848 93 %     Weight --      Height --      Head Circumference --      Peak Flow --      Pain Score 07/12/21 1846 4     Pain Loc --      Pain Edu? --      Excl. in [place]? --    No data found.  Updated Vital Signs BP 116/74   Pulse 97   Temp (!) 97.4 F (36.3 C) (Oral)   Resp 18   SpO2 93%   Visual Acuity Right Eye Distance:   Left Eye Distance:   Bilateral Distance:    Right Eye Near:   Left Eye Near:    Bilateral Near:     Physical Exam Vitals reviewed.  Constitutional:      General: She is not in acute distress.    Appearance: She is not ill-appearing, toxic-appearing or diaphoretic.  HENT:     Nose: Nose normal.      Mouth/Throat:     Mouth: Mucous membranes are moist.     Pharynx: No oropharyngeal exudate or posterior oropharyngeal erythema.  Eyes:     Extraocular Movements: Extraocular movements intact.     Pupils: Pupils are equal, round, and reactive to light.  Cardiovascular:     Rate and Rhythm: Normal rate and regular rhythm.     Heart sounds: No murmur heard. Pulmonary:     Effort: Pulmonary effort is normal.     Breath sounds: Normal breath sounds.  Abdominal:     Palpations: Abdomen is soft. There is no mass.     Tenderness: There is no abdominal tenderness. There is no guarding.  Musculoskeletal:     Cervical back: Neck supple.  Lymphadenopathy:     Cervical: No cervical adenopathy.  Skin:    Coloration: Skin is not jaundiced or pale.  Neurological:     General: No focal deficit present.     Mental Status: She is alert and oriented to person, place, and time.  Psychiatric:        Behavior: Behavior normal.     UC Treatments / Results  Labs (all labs ordered are listed, but only abnormal results are displayed) Labs Reviewed  POCT URINALYSIS DIP (MANUAL ENTRY) - Abnormal; Notable for the following components:      Result Value   Protein Ur, POC trace (*)    Leukocytes, UA Small (1+) (*)    All other components within normal limits  URINE CULTURE  NOVEL CORONAVIRUS, NAA    EKG   Radiology No results found.  Procedures Procedures (including critical care time)  Medications Ordered in UC Medications - No data to display  Initial Impression / Assessment and Plan / UC Course  I have reviewed the triage vital signs and the nursing notes.  Pertinent labs & imaging results that were available during my care of the patient were reviewed by me and considered in my medical decision making (see chart for details).     Urinalysis shows some white blood cells.  We will culture the urine, and treat her with Keflex.  At her request we will do a COVID swab, and we will  notify her if it is positive.  She is past the time for oral antivirals would give her any benefit Final Clinical Impressions(s) / UC Diagnoses   Final diagnoses:  Cystitis  Acute cough     Discharge Instructions      Urinalysis showed some white blood cells  Take cephalexin 250 mg--1 capsule 3 times daily for 7 days.  Urine culture is sent, and staff will call you if it looks like you need a different antibiotic   You have been swabbed for COVID, and the test will result in the next 24 hours. Our staff will call you if positive. If the test is positive, you should quarantine for 5 days.      ED Prescriptions     Medication Sig Dispense Auth. Provider   cephALEXin (KEFLEX) 250 MG capsule Take 1 capsule (250 mg total) by mouth 3 (three) times daily for 7 days. 21 capsule Barrett Henle, MD      PDMP not reviewed this encounter.   Barrett Henle, MD 07/12/21 1924

## 2021-07-12 NOTE — ED Notes (Signed)
When asked about suicide pt reports sister recently passed away and she has been depressed since then, lack of energy and wishes to stay home

## 2021-07-12 NOTE — Discharge Instructions (Addendum)
Urinalysis showed some white blood cells  Take cephalexin 250 mg--1 capsule 3 times daily for 7 days.  Urine culture is sent, and staff will call you if it looks like you need a different antibiotic   You have been swabbed for COVID, and the test will result in the next 24 hours. Our staff will call you if positive. If the test is positive, you should quarantine for 5 days.

## 2021-07-12 NOTE — ED Triage Notes (Signed)
Patient presents to Urgent Care with complaints of urinary frequency, low/mid back pain, and vaginal itching since last week. Patient reports she was covid pos 3 weeks ago, since then sore throat and fatigue remain, has never had uti to her knowledge .  Has since tested neg

## 2021-07-13 ENCOUNTER — Other Ambulatory Visit: Payer: Self-pay | Admitting: *Deleted

## 2021-07-13 DIAGNOSIS — Z79811 Long term (current) use of aromatase inhibitors: Secondary | ICD-10-CM

## 2021-07-13 DIAGNOSIS — C50412 Malignant neoplasm of upper-outer quadrant of left female breast: Secondary | ICD-10-CM

## 2021-07-14 LAB — URINE CULTURE: Culture: <10000 — AB

## 2021-07-14 LAB — NOVEL CORONAVIRUS, NAA: SARS-CoV-2, NAA: NOT DETECTED

## 2021-08-01 ENCOUNTER — Encounter: Payer: Self-pay | Admitting: Adult Health

## 2021-08-01 DIAGNOSIS — M810 Age-related osteoporosis without current pathological fracture: Secondary | ICD-10-CM | POA: Diagnosis not present

## 2021-08-01 DIAGNOSIS — M8588 Other specified disorders of bone density and structure, other site: Secondary | ICD-10-CM | POA: Diagnosis not present

## 2021-08-01 DIAGNOSIS — Z780 Asymptomatic menopausal state: Secondary | ICD-10-CM | POA: Diagnosis not present

## 2021-08-08 ENCOUNTER — Ambulatory Visit: Payer: Medicare Other | Admitting: Family Medicine

## 2021-08-09 ENCOUNTER — Ambulatory Visit: Payer: Medicare Other

## 2021-08-09 ENCOUNTER — Ambulatory Visit: Payer: Medicare Other | Admitting: Family Medicine

## 2021-08-10 ENCOUNTER — Ambulatory Visit
Admission: EM | Admit: 2021-08-10 | Discharge: 2021-08-10 | Disposition: A | Discharge status: Home / Self Care | Payer: Medicare Other | Attending: Internal Medicine | Admitting: Internal Medicine

## 2021-08-10 ENCOUNTER — Ambulatory Visit: Payer: Medicare Other | Admitting: Family Medicine

## 2021-08-10 ENCOUNTER — Ambulatory Visit (INDEPENDENT_AMBULATORY_CARE_PROVIDER_SITE_OTHER): Payer: Medicare Other

## 2021-08-10 DIAGNOSIS — R053 Chronic cough: Secondary | ICD-10-CM | POA: Diagnosis not present

## 2021-08-10 DIAGNOSIS — M542 Cervicalgia: Secondary | ICD-10-CM | POA: Insufficient documentation

## 2021-08-10 DIAGNOSIS — R42 Dizziness and giddiness: Secondary | ICD-10-CM | POA: Diagnosis not present

## 2021-08-10 DIAGNOSIS — R5383 Other fatigue: Secondary | ICD-10-CM | POA: Diagnosis not present

## 2021-08-10 DIAGNOSIS — R079 Chest pain, unspecified: Secondary | ICD-10-CM | POA: Diagnosis not present

## 2021-08-10 DIAGNOSIS — M549 Dorsalgia, unspecified: Secondary | ICD-10-CM | POA: Insufficient documentation

## 2021-08-10 DIAGNOSIS — R059 Cough, unspecified: Secondary | ICD-10-CM

## 2021-08-10 LAB — POCT URINALYSIS DIP (MANUAL ENTRY)
Bilirubin, UA: NEGATIVE
Blood, UA: NEGATIVE
Glucose, UA: NEGATIVE mg/dL
Nitrite, UA: NEGATIVE
Protein Ur, POC: 30 mg/dL — AB
Spec Grav, UA: 1.015 (ref 1.010–1.025)
Urobilinogen, UA: 0.2 E.U./dL
pH, UA: 5.5 (ref 5.0–8.0)

## 2021-08-10 MED ORDER — BUDESONIDE-FORMOTEROL FUMARATE 160-4.5 MCG/ACT IN AERO: 2.0000 | INHALATION_SPRAY | Freq: Two times a day (BID) | RESPIRATORY_TRACT | 12 refills | Status: DC

## 2021-08-10 MED ORDER — BENZONATATE 100 MG PO CAPS
100.0000 mg | ORAL_CAPSULE | Freq: Three times a day (TID) | ORAL | 0 refills | Status: DC | PRN
Start: 2021-08-10 — End: 2021-10-01

## 2021-08-10 NOTE — ED Triage Notes (Signed)
Pt c/o lightheaded, shoulder and back pain, cough,

## 2021-08-10 NOTE — Discharge Instructions (Signed)
Your chest x-ray was negative for any pneumonia or any obvious concerns.  You have been prescribed inhaler that you will take for your cough twice daily.  Please wash mouth out after taking it.  You have also been prescribed a cough medication.  Please follow-up with your primary care doctor for all other concerns.  Please follow-up with your oncologist for your left breast pain as soon as possible.

## 2021-08-10 NOTE — ED Provider Notes (Signed)
[name]    CSN: [id number] Arrival date & time: 08/10/21  1808      History   Chief Complaint Chief Complaint  Patient presents with   Pain    HPI [name] [name] is a 81 y.o. female.   Patient presents for multiple different chief complaints today. She presents for persistent cough and generally not feeling well after previous visit approximately 1 month ago.  Patient was seen in urgent care on 6/1 and was treated for cough and cystitis with antibiotic.  She reports that urinary symptoms have resolved but she has had a persistent cough.  Denies any associated upper respiratory symptoms, fever, shortness of breath.  She does report that she has had some upper back pain and neck pain since visit as well. Denies any injury to that area. Movement exacerbates pain. has applied lidocaine patch to that area with some improvement.  She also reports some dizziness. Denies any associated headache or blurred vision.  Denies head trauma or recent falls. Dizziness is worse when awakening in the morning. She has had a history of dizziness with "crystals in her ear" that resolved with physical therapy.  Denies history of asthma or COPD.  She also reporting some left-sided chest pain but reports it only hurts when she presses on it.  She reports that she had a lumpectomy in that area.  She is followed by oncology for history of breast cancer. She has mammogram scheduled for next week.  She has had a lumpectomy in the right breast and a biopsy in the left breast.     Past Medical History:  Diagnosis Date   Allergic rhinitis    Anxiety    Breast cancer ([place]) hx 2004   recurrent 2006 DR Magrinat   Family history of breast cancer    Genetic testing 12/05/2016   Multi-Cancer panel (83 genes) @ [facility] - Monoallelic mutation in [place] (carrier)   HTN (hypertension)    Hyperlipidemia    Hypothyroidism    Personal history of chemotherapy 2002   Personal history of radiation therapy 2002    Psoriasis    S/P thyroidectomy 07/2005   2 cm largest diameter (1 other tiny focus)/ i-131 rx 99 mci 08/2005   Thyroid cancer ([place])    Papillary Stage 1 - Dr Loanne Drilling   Vitamin B12 deficiency 2009   Vitamin D deficiency 2009    Patient Active Problem List   Diagnosis Date Noted   Palpitations 02/19/2021   DOE (dyspnea on exertion) 11/16/2020   Chronic sinusitis 11/16/2020   Upper respiratory infection 09/28/2020   Retinal artery occlusion, branch, right 08/09/2020   History of TIA (transient ischemic attack) 08/09/2020   Hyperparathyroidism ([place]) 06/13/2020   Skin lesion 03/21/2020   Cerebrovascular disease 03/21/2020   Pruritus 03/14/2020   Ataxia 07/20/2019   Seroma of breast 07/20/2019   Cerumen impaction 03/23/2019   Wrist pain 03/23/2019   Right ear pain 06/27/2018   Coronary artery disease 01/20/2018   Breast cyst, left 09/17/2017   Aortic atherosclerosis ([place]) 07/28/2017   Osteoporosis 05/07/2017   Genetic testing 12/05/2016   Family history of breast cancer    Port catheter in place 10/23/2016   Encounter for antineoplastic chemotherapy 10/23/2016   Malignant neoplasm of upper-outer quadrant of left breast in female, estrogen receptor positive ([place]) 07/29/2016   Well adult exam 06/05/2015   Neoplasm of uncertain behavior of skin 06/05/2015   Adenopathy, cervical 06/05/2015   Sinus tachycardia 06/07/2014   Wart viral  10/18/2011   Hyperglycemia 11/06/2010   Actinic keratoses 05/25/2010   Essential hypertension 02/23/2010   SHOULDER PAIN 11/22/2009   Chronic fatigue 11/08/2009   RLQ PAIN 11/08/2009   VERTIGO 07/11/2009   ECZEMA 10/19/2008   CT, CHEST, ABNORMAL 05/30/2008   STYE 02/17/2008   B12 deficiency 12/16/2007   Vitamin D deficiency 12/16/2007   HYPOTHYROIDISM, POSTSURGICAL 12/01/2007   Acute maxillary sinusitis 11/11/2007   ALLERGIC RESPIRATORY DISEASE, EXTRINSIC 11/11/2007   THYROID CANCER 08/19/2007   Dyslipidemia 08/19/2007   Anxiety disorder  08/19/2007   Situational depression 08/19/2007   Allergic rhinitis 08/19/2007    Past Surgical History:  Procedure Laterality Date   BREAST LUMPECTOMY     BREAST LUMPECTOMY WITH RADIOACTIVE SEED AND SENTINEL LYMPH NODE BIOPSY Left 09/11/2016   Procedure: LEFT BREAST LUMPECTOMY WITH RADIOACTIVE SEED AND LEFT AXILLARY SENTINEL LYMPH NODE BIOPSY WITH BLUE DYE INJECTION;  Surgeon: [name], MD;  Location: [facility];  Service: General;  Laterality: Left;   IR FLUORO GUIDE PORT INSERTION RIGHT  09/13/2016   IR REMOVAL TUN ACCESS W/ PORT W/O FL MOD SED  12/30/2016   IR US GUIDE VASC ACCESS RIGHT  09/13/2016   MASTECTOMY Right    Right   PORTACATH PLACEMENT N/A 09/11/2016   Procedure: ATTEMPTED INSERTION PORT-A-CATH WITH ULTRA SOUND GUIDANCE;  Surgeon: [name], MD;  Location: [facility];  Service: General;  Laterality: N/A;   REDUCTION MAMMAPLASTY Left 2005   THYROIDECTOMY  2007    OB History   No obstetric history on file.      Home Medications    Prior to Admission medications   Medication Sig Start Date End Date Taking? Authorizing Provider  benzonatate (TESSALON) 100 MG capsule Take 1 capsule (100 mg total) by mouth every 8 (eight) hours as needed for cough. 08/10/21  Yes Thornton Dohrmann, Michele Rockers, FNP  budesonide-formoterol (SYMBICORT) 160-4.5 MCG/ACT inhaler Inhale 2 puffs into the lungs in the morning and at bedtime. 08/10/21  Yes Sufyaan Palma, Hildred Alamin E, FNP  ALPRAZolam (XANAX) 1 MG tablet TAKE 1/2 TO 1 TABLET BY MOUTH 2 TIMES DAILY AS NEEDED FOR ANXIETY 04/29/21   Plotnikov, Evie Lacks, MD  anastrozole (ARIMIDEX) 1 MG tablet TAKE 1 TABLET BY MOUTH EVERY DAY 11/13/20   Magrinat, [name], MD  cholecalciferol (VITAMIN D) 1000 units tablet Take 2 tablets (2,000 Units total) by mouth daily. 06/19/17   Plotnikov, Evie Lacks, MD  clopidogrel (PLAVIX) 75 MG tablet Take 1 tablet (75 mg total) by mouth daily. 09/28/20   Plotnikov, Evie Lacks, MD  levothyroxine (SYNTHROID)  112 MCG tablet TAKE 1 TABLET (112 MCG TOTAL) BY MOUTH DAILY. 10/26/20   Plotnikov, Evie Lacks, MD  meclizine (ANTIVERT) 25 MG tablet Take 1 tablet (25 mg total) by mouth 3 (three) times daily as needed for dizziness. Patient not taking: Reported on 07/12/2021 11/20/20   Plotnikov, Evie Lacks, MD  rosuvastatin (CRESTOR) 20 MG tablet Take 1 tablet (20 mg total) by mouth daily. 05/08/21   Gardenia Phlegm, NP  telmisartan (MICARDIS) 20 MG tablet Take 1 tablet (20 mg total) by mouth daily. 04/26/21   Plotnikov, Evie Lacks, MD  venlafaxine XR (EFFEXOR XR) 150 MG 24 hr capsule Take 1 capsule (150 mg total) by mouth daily with breakfast. 06/18/21   Plotnikov, Evie Lacks, MD  vitamin B-12 (CYANOCOBALAMIN) 1000 MCG tablet Take 1,000 mcg by mouth daily.    [provider]    Family History Family History  Problem Relation Age of Onset  Stroke Mother    Allergies Mother    Asthma Mother    Clotting disorder Mother    Heart disease Father 98       MI   Allergies Sister    Asthma Sister    Breast cancer Sister 35       Lymphoma 28; currently 47   Lymphoma Sister    Hyperparathyroidism Sister    Asthma Brother    Leukemia Brother        dx 77s   Allergies Daughter    Asthma Daughter    Allergies Sister    Breast cancer Sister 55       currently 68   Kidney cancer Paternal Aunt        kidney ca; deceased 54   Liver cancer Paternal Uncle        unk. primary ("liver")   Throat cancer Maternal Grandmother        deceased 15   Lung cancer Maternal Grandfather        deceased 71   Pancreatic cancer Paternal Grandfather        deceased 22   Cancer Paternal Aunt        "abdominal"; deceased 9    Social History Social History   Tobacco Use   Smoking status: Never   Smokeless tobacco: Never  Substance Use Topics   Alcohol use: No   Drug use: No     Allergies   Pneumococcal vaccines, Sulfa antibiotics, Sulfacetamide sodium-sulfur, Sulfamethoxazole-trimethoprim, Tape, and  Tegaderm ag mesh [silver]   Review of Systems Review of Systems Per HPI  Physical Exam Triage Vital Signs ED Triage Vitals  Enc Vitals Group     BP 08/10/21 1833 107/73     Pulse Rate 08/10/21 1833 (!) 105     Resp 08/10/21 1833 18     Temp 08/10/21 1833 98.2 F (36.8 C)     Temp Source 08/10/21 1833 Oral     SpO2 08/10/21 1833 95 %     Weight --      Height --      Head Circumference --      Peak Flow --      Pain Score 08/10/21 1834 0     Pain Loc --      Pain Edu? --      Excl. in [place]? --    No data found.  Updated Vital Signs BP 107/73 (BP Location: Left Arm)   Pulse (!) 105   Temp 98.2 F (36.8 C) (Oral)   Resp 18   SpO2 95%   Visual Acuity Right Eye Distance:   Left Eye Distance:   Bilateral Distance:    Right Eye Near:   Left Eye Near:    Bilateral Near:     Physical Exam Constitutional:      General: She is not in acute distress.    Appearance: Normal appearance. She is not toxic-appearing or diaphoretic.  HENT:     Head: Normocephalic and atraumatic.     Right Ear: Tympanic membrane and ear canal normal.     Left Ear: Tympanic membrane and ear canal normal.     Nose: No congestion.     Mouth/Throat:     Mouth: Mucous membranes are moist.     Pharynx: No posterior oropharyngeal erythema.  Eyes:     Extraocular Movements: Extraocular movements intact.     Conjunctiva/sclera: Conjunctivae normal.     Pupils: Pupils are equal, round, and reactive to light.  Cardiovascular:     Rate and Rhythm: Normal rate and regular rhythm.     Pulses: Normal pulses.     Heart sounds: Normal heart sounds.  Pulmonary:     Effort: Pulmonary effort is normal. No respiratory distress.     Breath sounds: Normal breath sounds. No stridor. No wheezing, rhonchi or rales.  Chest:     Chest wall: Tenderness present.       Comments: Tenderness to palpation to left upper outer breast. Breast augmentation palpable. No discoloration, mass, warm noted.  Abdominal:      General: Abdomen is flat. Bowel sounds are normal.     Palpations: Abdomen is soft.  Musculoskeletal:        General: Normal range of motion.     Cervical back: Normal range of motion.     Comments: Tenderness to palpation across entirety of upper back and paraspinal neck muscles.  No direct spinal tenderness, crepitus, step-off.  Grip strength 5/5.  Skin:    General: Skin is warm and dry.  Neurological:     General: No focal deficit present.     Mental Status: She is alert and oriented to person, place, and time. Mental status is at baseline.     Cranial Nerves: Cranial nerves 2-12 are intact.     Sensory: Sensation is intact.     Motor: Motor function is intact.     Coordination: Coordination is intact.     Gait: Gait is intact.  Psychiatric:        Mood and Affect: Mood normal.        Behavior: Behavior normal.      UC Treatments / Results  Labs (all labs ordered are listed, but only abnormal results are displayed) Labs Reviewed  POCT URINALYSIS DIP (MANUAL ENTRY) - Abnormal; Notable for the following components:      Result Value   Ketones, POC UA trace (5) (*)    Protein Ur, POC =30 (*)    Leukocytes, UA Trace (*)    All other components within normal limits  URINE CULTURE  COMPREHENSIVE METABOLIC PANEL  CBC    EKG   Radiology DG Chest 2 View  Result Date: 08/10/2021 CLINICAL DATA:  Lightheaded with shoulder and back pain, cough. EXAM: CHEST - 2 VIEW COMPARISON:  August 09, 2020 FINDINGS: The heart size and mediastinal contours are within normal limits. Diffuse, chronic appearing increased interstitial lung markings are seen. There is no evidence of focal consolidation, pleural effusion or pneumothorax. Radiopaque surgical clips are seen overlying the right axilla bilateral breast soft tissues. Moderate severity scoliosis of the thoracic spine is seen. IMPRESSION: Stable exam without acute or active cardiopulmonary disease. Electronically Signed   By: Virgina Norfolk  M.D.   On: 08/10/2021 19:05    Procedures Procedures (including critical care time)  Medications Ordered in UC Medications - No data to display  Initial Impression / Assessment and Plan / UC Course  I have reviewed the triage vital signs and the nursing notes.  Pertinent labs & imaging results that were available during my care of the patient were reviewed by me and considered in my medical decision making (see chart for details).     Urinalysis showing trace leukocytes.  Will send urine culture to confirm for treatment given that shows improvement from previous visit.  Also patient does not have any urinary symptoms so will wait on urine culture.  Chest x-ray was negative for any acute cardiopulmonary process.  Will treat with  Symbicort twice daily.  Will avoid prednisone given patient's history of breast cancer and other comorbidities.  Benzonatate prescribed to take as needed for cough as well.  Suspect neck and upper back pain is musculoskeletal in nature.  Could be from harsh and persistent cough.  Patient to continue to do lidocaine patches and alternating ice and heat to affected area.  Also discussed Tylenol as needed.  Left breast/chest tenderness needs further evaluation and management by established oncology given history of breast cancer.  She has mammogram scheduled in 1 week but advised patient to follow-up as soon as possible with oncologist to notify them of symptoms given history of breast cancer.  No obvious signs of infection.  Chest pain is musculoskeletal in nature so do not think that EKG is necessary.  Patient complaining of dizziness as well but after further review of the chart, it appears that patient has reported dizziness at multiple different office visits so this is not a new complaint.  CMP and CBC pending.  Will defer further evaluation and management of dizziness to PCP. Vital signs and neuro exam are stable so I do think this is reasonable and that imaging of  the head is not necessary,   Patient advised to follow-up with PCP in the next few days for further evaluation and management of all chief complaints.  She states that she had an appointment today but missed it as she was late.  Encouraged patient to call back and schedule a new appointment.  Patient was given strict return and ER precautions for all chief complaints.  Patient verbalized understanding and was agreeable with plan Final Clinical Impressions(s) / UC Diagnoses   Final diagnoses:  Dizziness and giddiness  Other fatigue  Persistent cough  Upper back pain  Neck pain     Discharge Instructions      Your chest x-ray was negative for any pneumonia or any obvious concerns.  You have been prescribed inhaler that you will take for your cough twice daily.  Please wash mouth out after taking it.  You have also been prescribed a cough medication.  Please follow-up with your primary care doctor for all other concerns.  Please follow-up with your oncologist for your left breast pain as soon as possible.     ED Prescriptions     Medication Sig Dispense Auth. Provider   budesonide-formoterol (SYMBICORT) 160-4.5 MCG/ACT inhaler Inhale 2 puffs into the lungs in the morning and at bedtime. 1 each [place], [place] E, [place]   benzonatate (TESSALON) 100 MG capsule Take 1 capsule (100 mg total) by mouth every 8 (eight) hours as needed for cough. 21 capsule [place], Michele Rockers, [place]      PDMP not reviewed this encounter.   Teodora Medici, [place] 08/10/21 2031

## 2021-08-12 LAB — CBC
Hematocrit: 37.8 % (ref 34.0–46.6)
Hemoglobin: 12.2 g/dL (ref 11.1–15.9)
MCH: 29.3 pg (ref 26.6–33.0)
MCHC: 32.3 g/dL (ref 31.5–35.7)
MCV: 91 fL (ref 79–97)
Platelets: 395 10*3/uL (ref 150–450)
RBC: 4.16 x10E6/uL (ref 3.77–5.28)
RDW: 13.1 % (ref 11.7–15.4)
WBC: 6.3 10*3/uL (ref 3.4–10.8)

## 2021-08-12 LAB — URINE CULTURE: Culture: NO GROWTH

## 2021-08-12 LAB — COMPREHENSIVE METABOLIC PANEL
ALT: 13 IU/L (ref 0–32)
AST: 26 IU/L (ref 0–40)
Albumin/Globulin Ratio: 1.5 (ref 1.2–2.2)
Albumin: 4.1 g/dL (ref 3.6–4.6)
Alkaline Phosphatase: 90 IU/L (ref 44–121)
BUN/Creatinine Ratio: 13 (ref 12–28)
BUN: 21 mg/dL (ref 8–27)
Bilirubin Total: 0.3 mg/dL (ref 0.0–1.2)
CO2: 20 mmol/L (ref 20–29)
Calcium: 9.8 mg/dL (ref 8.7–10.3)
Chloride: 103 mmol/L (ref 96–106)
Creatinine, Ser: 1.65 mg/dL — ABNORMAL HIGH (ref 0.57–1.00)
Globulin, Total: 2.8 g/dL (ref 1.5–4.5)
Glucose: 137 mg/dL — ABNORMAL HIGH (ref 70–99)
Potassium: 5.3 mmol/L — ABNORMAL HIGH (ref 3.5–5.2)
Sodium: 140 mmol/L (ref 134–144)
Total Protein: 6.9 g/dL (ref 6.0–8.5)
eGFR: 31 mL/min/{1.73_m2} — ABNORMAL LOW (ref >59)

## 2021-08-15 ENCOUNTER — Telehealth (HOSPITAL_COMMUNITY): Payer: Self-pay | Admitting: Emergency Medicine

## 2021-08-15 ENCOUNTER — Ambulatory Visit: Payer: Medicare Other

## 2021-08-15 NOTE — Telephone Encounter (Signed)
Patient returned call, reviewed, f/u discussed, all questions answered

## 2021-08-15 NOTE — Telephone Encounter (Signed)
Per Hildred Alamin, APP "can you call and have her get her potassium rechecked with PCP. its mildly high. avoid potassium rich foods as well.  " Attempted to reach patient x 1, LVM

## 2021-08-16 ENCOUNTER — Emergency Department (HOSPITAL_COMMUNITY)
Admission: EM | Admit: 2021-08-16 | Discharge: 2021-08-17 | Disposition: A | Discharge status: Home / Self Care | Payer: Medicare Other | Attending: Emergency Medicine | Admitting: Emergency Medicine

## 2021-08-16 ENCOUNTER — Ambulatory Visit
Admission: EM | Admit: 2021-08-16 | Discharge: 2021-08-16 | Disposition: A | Discharge status: Home / Self Care | Payer: Medicare Other | Attending: Family Medicine | Admitting: Family Medicine

## 2021-08-16 ENCOUNTER — Other Ambulatory Visit: Payer: Self-pay

## 2021-08-16 ENCOUNTER — Emergency Department (HOSPITAL_COMMUNITY): Payer: Medicare Other

## 2021-08-16 ENCOUNTER — Encounter (HOSPITAL_COMMUNITY): Payer: Self-pay | Admitting: Emergency Medicine

## 2021-08-16 DIAGNOSIS — Z7902 Long term (current) use of antithrombotics/antiplatelets: Secondary | ICD-10-CM | POA: Insufficient documentation

## 2021-08-16 DIAGNOSIS — R4781 Slurred speech: Secondary | ICD-10-CM | POA: Insufficient documentation

## 2021-08-16 DIAGNOSIS — G319 Degenerative disease of nervous system, unspecified: Secondary | ICD-10-CM | POA: Diagnosis not present

## 2021-08-16 DIAGNOSIS — I700 Atherosclerosis of aorta: Secondary | ICD-10-CM | POA: Diagnosis not present

## 2021-08-16 DIAGNOSIS — Z79899 Other long term (current) drug therapy: Secondary | ICD-10-CM | POA: Insufficient documentation

## 2021-08-16 DIAGNOSIS — I739 Peripheral vascular disease, unspecified: Secondary | ICD-10-CM | POA: Diagnosis not present

## 2021-08-16 DIAGNOSIS — Y900 Blood alcohol level of less than 20 mg/100 ml: Secondary | ICD-10-CM | POA: Diagnosis not present

## 2021-08-16 DIAGNOSIS — F1310 Sedative, hypnotic or anxiolytic abuse, uncomplicated: Secondary | ICD-10-CM | POA: Diagnosis not present

## 2021-08-16 DIAGNOSIS — M542 Cervicalgia: Secondary | ICD-10-CM | POA: Insufficient documentation

## 2021-08-16 DIAGNOSIS — R000 Tachycardia, unspecified: Secondary | ICD-10-CM | POA: Diagnosis not present

## 2021-08-16 DIAGNOSIS — R4701 Aphasia: Secondary | ICD-10-CM | POA: Diagnosis not present

## 2021-08-16 DIAGNOSIS — R479 Unspecified speech disturbances: Secondary | ICD-10-CM | POA: Diagnosis not present

## 2021-08-16 DIAGNOSIS — R29818 Other symptoms and signs involving the nervous system: Secondary | ICD-10-CM | POA: Diagnosis not present

## 2021-08-16 DIAGNOSIS — I6523 Occlusion and stenosis of bilateral carotid arteries: Secondary | ICD-10-CM | POA: Diagnosis not present

## 2021-08-16 LAB — APTT: aPTT: 27 seconds (ref 24–36)

## 2021-08-16 LAB — URINALYSIS, ROUTINE W REFLEX MICROSCOPIC
Bilirubin Urine: NEGATIVE
Glucose, UA: 50 mg/dL — AB
Hgb urine dipstick: NEGATIVE
Ketones, ur: 5 mg/dL — AB
Nitrite: NEGATIVE
Protein, ur: 30 mg/dL — AB
Specific Gravity, Urine: 1.025 (ref 1.005–1.030)
pH: 5 (ref 5.0–8.0)

## 2021-08-16 LAB — COMPREHENSIVE METABOLIC PANEL
ALT: 18 U/L (ref 0–44)
AST: 30 U/L (ref 15–41)
Albumin: 3.5 g/dL (ref 3.5–5.0)
Alkaline Phosphatase: 65 U/L (ref 38–126)
Anion gap: 12 (ref 5–15)
BUN: 17 mg/dL (ref 8–23)
CO2: 20 mmol/L — ABNORMAL LOW (ref 22–32)
Calcium: 9.2 mg/dL (ref 8.9–10.3)
Chloride: 104 mmol/L (ref 98–111)
Creatinine, Ser: 1.36 mg/dL — ABNORMAL HIGH (ref 0.44–1.00)
GFR, Estimated: 39 mL/min — ABNORMAL LOW (ref >60)
Glucose, Bld: 178 mg/dL — ABNORMAL HIGH (ref 70–99)
Potassium: 3.9 mmol/L (ref 3.5–5.1)
Sodium: 136 mmol/L (ref 135–145)
Total Bilirubin: 0.6 mg/dL (ref 0.3–1.2)
Total Protein: 6.7 g/dL (ref 6.5–8.1)

## 2021-08-16 LAB — CBC WITH DIFFERENTIAL/PLATELET
Abs Immature Granulocytes: 0.03 10*3/uL (ref 0.00–0.07)
Basophils Absolute: 0 10*3/uL (ref 0.0–0.1)
Basophils Relative: 0 %
Eosinophils Absolute: 0.2 10*3/uL (ref 0.0–0.5)
Eosinophils Relative: 3 %
HCT: 37.9 % (ref 36.0–46.0)
Hemoglobin: 12.3 g/dL (ref 12.0–15.0)
Immature Granulocytes: 0 %
Lymphocytes Relative: 27 %
Lymphs Abs: 1.9 10*3/uL (ref 0.7–4.0)
MCH: 29.2 pg (ref 26.0–34.0)
MCHC: 32.5 g/dL (ref 30.0–36.0)
MCV: 90 fL (ref 80.0–100.0)
Monocytes Absolute: 0.8 10*3/uL (ref 0.1–1.0)
Monocytes Relative: 12 %
Neutro Abs: 4 10*3/uL (ref 1.7–7.7)
Neutrophils Relative %: 58 %
Platelets: 386 10*3/uL (ref 150–400)
RBC: 4.21 MIL/uL (ref 3.87–5.11)
RDW: 12.8 % (ref 11.5–15.5)
WBC: 6.9 10*3/uL (ref 4.0–10.5)
nRBC: 0 % (ref 0.0–0.2)

## 2021-08-16 LAB — CBG MONITORING, ED: Glucose-Capillary: 178 mg/dL — ABNORMAL HIGH (ref 70–99)

## 2021-08-16 LAB — I-STAT CHEM 8, ED
BUN: 18 mg/dL (ref 8–23)
Calcium, Ion: 1.19 mmol/L (ref 1.15–1.40)
Chloride: 105 mmol/L (ref 98–111)
Creatinine, Ser: 1.3 mg/dL — ABNORMAL HIGH (ref 0.44–1.00)
Glucose, Bld: 174 mg/dL — ABNORMAL HIGH (ref 70–99)
HCT: 38 % (ref 36.0–46.0)
Hemoglobin: 12.9 g/dL (ref 12.0–15.0)
Potassium: 3.9 mmol/L (ref 3.5–5.1)
Sodium: 137 mmol/L (ref 135–145)
TCO2: 21 mmol/L — ABNORMAL LOW (ref 22–32)

## 2021-08-16 LAB — RAPID URINE DRUG SCREEN, HOSP PERFORMED
Amphetamines: NOT DETECTED
Barbiturates: NOT DETECTED
Benzodiazepines: POSITIVE — AB
Cocaine: NOT DETECTED
Opiates: NOT DETECTED
Tetrahydrocannabinol: NOT DETECTED

## 2021-08-16 LAB — PROTIME-INR
INR: 1.1 (ref 0.8–1.2)
Prothrombin Time: 13.6 seconds (ref 11.4–15.2)

## 2021-08-16 LAB — POCT FASTING CBG KUC MANUAL ENTRY: POCT Glucose (KUC): 203 mg/dL — AB (ref 70–99)

## 2021-08-16 LAB — ETHANOL: Alcohol, Ethyl (B): <10 mg/dL (ref <10)

## 2021-08-16 NOTE — ED Provider Triage Note (Signed)
Emergency Medicine Provider Triage Evaluation Note  [name] [name] , a 81 y.o. female  was evaluated in triage.  Pt complains of trouble getting her words out since waking up.  Also been complaining of neck pain and headache.  Symptoms have been waxing and waning throughout the day.  Denies any blurry vision, numbness in arms or legs or changes to gait.  Went to sleep last night in her usual state of health.  Denies any injury or trauma.  Review of Systems  Positive: Above Negative: Headache, blurry vision  Physical Exam  There were no vitals taken for this visit. Gen:   Awake, no distress   Resp:  Normal effort  MSK:   Moves extremities without difficulty  Other:  Normal speech.  Alert and oriented.  No facial asymmetry.  Pupils equal reactive to light bilaterally.  Strength 5/5 in bilateral upper and lower extremities.  Normal sensation to light touch of bilateral upper and lower extremities, face.  Medical Decision Making  Medically screening exam initiated at 7:38 PM.  Appropriate orders placed.  [name] [name] was informed that the remainder of the evaluation will be completed by another provider, this initial triage assessment does not replace that evaluation, and the importance of remaining in the ED until their evaluation is complete.  No indication for code stroke but will initiate workup   Delia Heady, PA-C 08/16/21 1941

## 2021-08-16 NOTE — ED Provider Notes (Signed)
[name]    CSN: [id number] Arrival date & time: 08/16/21  1813      History   Chief Complaint Chief Complaint  Patient presents with   Altered Mental Status    HPI [name] [name] is a 81 y.o. female.    Altered Mental Status  Here with trouble finding her words and speaking that was most severe this morning when she awoke early.  She states that has improved some over the day.  Here in the exam room she still has trouble finding her words at times.   And no chills and no cough.  She was admitted to the hospital about 1 year ago.  She states initially that no diagnosis was given at that time.  This is incorrect as she was found to have a retinal artery occlusion at that time.  She is on Plavix  Past Medical History:  Diagnosis Date   Allergic rhinitis    Anxiety    Breast cancer ([place]) hx 2004   recurrent 2006 DR Magrinat   Family history of breast cancer    Genetic testing 12/05/2016   Multi-Cancer panel (83 genes) @ [facility] - Monoallelic mutation in [place] (carrier)   HTN (hypertension)    Hyperlipidemia    Hypothyroidism    Personal history of chemotherapy 2002   Personal history of radiation therapy 2002   Psoriasis    S/P thyroidectomy 07/2005   2 cm largest diameter (1 other tiny focus)/ i-131 rx 99 mci 08/2005   Thyroid cancer ([place])    Papillary Stage 1 - Dr Loanne Drilling   Vitamin B12 deficiency 2009   Vitamin D deficiency 2009    Patient Active Problem List   Diagnosis Date Noted   Palpitations 02/19/2021   DOE (dyspnea on exertion) 11/16/2020   Chronic sinusitis 11/16/2020   Upper respiratory infection 09/28/2020   Retinal artery occlusion, branch, right 08/09/2020   History of TIA (transient ischemic attack) 08/09/2020   Hyperparathyroidism ([place]) 06/13/2020   Skin lesion 03/21/2020   Cerebrovascular disease 03/21/2020   Pruritus 03/14/2020   Ataxia 07/20/2019   Seroma of breast 07/20/2019   Cerumen impaction 03/23/2019   Wrist pain  03/23/2019   Right ear pain 06/27/2018   Coronary artery disease 01/20/2018   Breast cyst, left 09/17/2017   Aortic atherosclerosis ([place]) 07/28/2017   Osteoporosis 05/07/2017   Genetic testing 12/05/2016   Family history of breast cancer    Port catheter in place 10/23/2016   Encounter for antineoplastic chemotherapy 10/23/2016   Malignant neoplasm of upper-outer quadrant of left breast in female, estrogen receptor positive ([place]) 07/29/2016   Well adult exam 06/05/2015   Neoplasm of uncertain behavior of skin 06/05/2015   Adenopathy, cervical 06/05/2015   Sinus tachycardia 06/07/2014   Wart viral 10/18/2011   Hyperglycemia 11/06/2010   Actinic keratoses 05/25/2010   Essential hypertension 02/23/2010   SHOULDER PAIN 11/22/2009   Chronic fatigue 11/08/2009   RLQ PAIN 11/08/2009   VERTIGO 07/11/2009   ECZEMA 10/19/2008   CT, CHEST, ABNORMAL 05/30/2008   STYE 02/17/2008   B12 deficiency 12/16/2007   Vitamin D deficiency 12/16/2007   HYPOTHYROIDISM, POSTSURGICAL 12/01/2007   Acute maxillary sinusitis 11/11/2007   ALLERGIC RESPIRATORY DISEASE, EXTRINSIC 11/11/2007   THYROID CANCER 08/19/2007   Dyslipidemia 08/19/2007   Anxiety disorder 08/19/2007   Situational depression 08/19/2007   Allergic rhinitis 08/19/2007    Past Surgical History:  Procedure Laterality Date   BREAST LUMPECTOMY     BREAST LUMPECTOMY WITH RADIOACTIVE  SEED AND SENTINEL LYMPH NODE BIOPSY Left 09/11/2016   Procedure: LEFT BREAST LUMPECTOMY WITH RADIOACTIVE SEED AND LEFT AXILLARY SENTINEL LYMPH NODE BIOPSY WITH BLUE DYE INJECTION;  Surgeon: [name], MD;  Location: ;  Service: General;  Laterality: Left;   IR FLUORO GUIDE PORT INSERTION RIGHT  09/13/2016   IR REMOVAL TUN ACCESS W/ PORT W/O FL MOD SED  12/30/2016   IR US GUIDE VASC ACCESS RIGHT  09/13/2016   MASTECTOMY Right    Right   PORTACATH PLACEMENT N/A 09/11/2016   Procedure: ATTEMPTED INSERTION PORT-A-CATH WITH ULTRA  SOUND GUIDANCE;  Surgeon: [name], MD;  Location: [place];  Service: General;  Laterality: N/A;   REDUCTION MAMMAPLASTY Left 2005   THYROIDECTOMY  2007    OB History   No obstetric history on file.      Home Medications    Prior to Admission medications   Medication Sig Start Date End Date Taking? Authorizing Provider  ALPRAZolam (XANAX) 1 MG tablet TAKE 1/2 TO 1 TABLET BY MOUTH 2 TIMES DAILY AS NEEDED FOR ANXIETY 04/29/21   Plotnikov, Evie Lacks, MD  anastrozole (ARIMIDEX) 1 MG tablet TAKE 1 TABLET BY MOUTH EVERY DAY 11/13/20   Magrinat, [name], MD  benzonatate (TESSALON) 100 MG capsule Take 1 capsule (100 mg total) by mouth every 8 (eight) hours as needed for cough. 08/10/21   Teodora Medici, FNP  budesonide-formoterol [facility]) 160-4.5 MCG/ACT inhaler Inhale 2 puffs into the lungs in the morning and at bedtime. 08/10/21   Teodora Medici, FNP  cholecalciferol (VITAMIN D) 1000 units tablet Take 2 tablets (2,000 Units total) by mouth daily. 06/19/17   Plotnikov, Evie Lacks, MD  clopidogrel (PLAVIX) 75 MG tablet Take 1 tablet (75 mg total) by mouth daily. 09/28/20   Plotnikov, Evie Lacks, MD  levothyroxine (SYNTHROID) 112 MCG tablet TAKE 1 TABLET (112 MCG TOTAL) BY MOUTH DAILY. 10/26/20   Plotnikov, Evie Lacks, MD  meclizine (ANTIVERT) 25 MG tablet Take 1 tablet (25 mg total) by mouth 3 (three) times daily as needed for dizziness. Patient not taking: Reported on 07/12/2021 11/20/20   Plotnikov, Evie Lacks, MD  rosuvastatin (CRESTOR) 20 MG tablet Take 1 tablet (20 mg total) by mouth daily. 05/08/21   Gardenia Phlegm, NP  telmisartan (MICARDIS) 20 MG tablet Take 1 tablet (20 mg total) by mouth daily. 04/26/21   Plotnikov, Evie Lacks, MD  venlafaxine XR (EFFEXOR XR) 150 MG 24 hr capsule Take 1 capsule (150 mg total) by mouth daily with breakfast. 06/18/21   Plotnikov, Evie Lacks, MD  vitamin B-12 (CYANOCOBALAMIN) 1000 MCG tablet Take 1,000 mcg by mouth daily.    [provider]    Family History Family History  Problem Relation Age of Onset   Stroke Mother    Allergies Mother    Asthma Mother    Clotting disorder Mother    Heart disease Father 53       MI   Allergies Sister    Asthma Sister    Breast cancer Sister 20       Lymphoma 71; currently 53   Lymphoma Sister    Hyperparathyroidism Sister    Asthma Brother    Leukemia Brother        dx 54s   Allergies Daughter    Asthma Daughter    Allergies Sister    Breast cancer Sister 79       currently 38   Kidney cancer Paternal Aunt  kidney ca; deceased 21   Liver cancer Paternal Uncle        unk. primary ("liver")   Throat cancer Maternal Grandmother        deceased 43   Lung cancer Maternal Grandfather        deceased 59   Pancreatic cancer Paternal Grandfather        deceased 81   Cancer Paternal Aunt        "abdominal"; deceased 18    Social History Social History   Tobacco Use   Smoking status: Never   Smokeless tobacco: Never  Substance Use Topics   Alcohol use: No   Drug use: No     Allergies   Pneumococcal vaccines, Sulfa antibiotics, Sulfacetamide sodium-sulfur, Sulfamethoxazole-trimethoprim, Tape, and Tegaderm ag mesh [silver]   Review of Systems Review of Systems   Physical Exam Triage Vital Signs ED Triage Vitals  Enc Vitals Group     BP 08/16/21 1834 116/78     Pulse Rate 08/16/21 1834 (!) 114     Resp 08/16/21 1834 18     Temp 08/16/21 1834 97.8 F (36.6 C)     Temp Source 08/16/21 1834 Oral     SpO2 08/16/21 1834 98 %     Weight --      Height --      Head Circumference --      Peak Flow --      Pain Score 08/16/21 1833 0     Pain Loc --      Pain Edu? --      Excl. in [place]? --    No data found.  Updated Vital Signs BP 116/78   Pulse (!) 114   Temp 97.8 F (36.6 C) (Oral)   Resp 18   SpO2 98%   Visual Acuity Right Eye Distance:   Left Eye Distance:   Bilateral Distance:    Right Eye Near:   Left Eye Near:     Bilateral Near:     Physical Exam Vitals reviewed.  Constitutional:      General: She is not in acute distress.    Appearance: She is not ill-appearing, toxic-appearing or diaphoretic.  HENT:     Mouth/Throat:     Mouth: Mucous membranes are moist.  Musculoskeletal:        General: No swelling or tenderness.  Neurological:     Mental Status: She is alert and oriented to person, place, and time.     Comments: Question facial droop but there is maybe a little weakness on the right.  No arm drift.  She does have trouble finding her words when speaking to me.      UC Treatments / Results  Labs (all labs ordered are listed, but only abnormal results are displayed) Labs Reviewed  POCT FASTING [facility] [place]    EKG   Radiology No results found.  Procedures Procedures (including critical care time)  Medications Ordered in UC Medications - No data to display  Initial Impression / Assessment and Plan / UC Course  I have reviewed the triage vital signs and the nursing notes.  Pertinent labs & imaging results that were available during my care of the patient were reviewed by me and considered in my medical decision making (see chart for details).     She blood sugar is 203 1 hour after eating.  Her husband is driving, so I think she is safe to proceed and private car to emergency room.  She asked me if we could do x-rays to rule out a stroke year, and I told her we could not.  I think she needs a higher level of care and evaluation then we can do here. Final Clinical Impressions(s) / UC Diagnoses   Final diagnoses:  None   Discharge Instructions   None    ED Prescriptions   None    PDMP not reviewed this encounter.   Barrett Henle, MD 08/16/21 [phone]

## 2021-08-16 NOTE — Discharge Instructions (Addendum)
Please proceed to the emergency room.

## 2021-08-16 NOTE — ED Triage Notes (Signed)
Pt reported to ED for UC for further evaluation of possible stroke like symptoms. Pt states that she had difficulty with speech that began upon awakening this morning, with "weird feeling" to left side of face and ringing in ears.  Also states she has been feeling nauseated as well. Reports hx of TIA and takes blood thinners.

## 2021-08-16 NOTE — ED Triage Notes (Addendum)
Patient presents to Urgent Care with complaints of L shoulder pain this morning around 5 am  Patient reports she woke up in pain so she got up to go get medication and her husband asked her what was wrong and she had difficulty finding her words. Pt reports it has been this way most of the day but did get better when she arrived to the dentist around 3 pm. Pt reports she is having work done on a root cannel/bridge. pt reports mild nausea with dinner and last year she had a tia. Pt reports taking xanex today for anxiey. Lost a few family members this yeat and husband is struggling with parkinson's so she has been under a lot of stress. GCS 456 perrla 69m  5 strength in L arm 4 R arm  5 L leg  5 R leg  Nih total 1  [place], md aware

## 2021-08-17 ENCOUNTER — Emergency Department (HOSPITAL_COMMUNITY): Payer: Medicare Other

## 2021-08-17 ENCOUNTER — Ambulatory Visit: Payer: Medicare Other

## 2021-08-17 DIAGNOSIS — R29818 Other symptoms and signs involving the nervous system: Secondary | ICD-10-CM | POA: Diagnosis not present

## 2021-08-17 DIAGNOSIS — I6523 Occlusion and stenosis of bilateral carotid arteries: Secondary | ICD-10-CM | POA: Diagnosis not present

## 2021-08-17 DIAGNOSIS — I739 Peripheral vascular disease, unspecified: Secondary | ICD-10-CM | POA: Diagnosis not present

## 2021-08-17 DIAGNOSIS — G319 Degenerative disease of nervous system, unspecified: Secondary | ICD-10-CM | POA: Diagnosis not present

## 2021-08-17 DIAGNOSIS — R4701 Aphasia: Secondary | ICD-10-CM | POA: Diagnosis not present

## 2021-08-17 MED ORDER — IOHEXOL 350 MG/ML SOLN
60.0000 mL | Freq: Once | INTRAVENOUS | Status: AC | PRN
  Administered 2021-08-17: 60 mL via INTRAVENOUS

## 2021-08-17 NOTE — ED Provider Notes (Signed)
[facility]  Provider Note  CSN: [id number] Arrival date & time: 08/16/21 1912  History Chief Complaint  Patient presents with   Aphasia    [name] [name] is a 81 y.o. female sent to the ED from UC for speech difficulty onset the morning prior of arrival when she reports having difficulty getting some words out. She was having some neck pain when she woke up as well. No blurry vision, numbness, weakness or difficulty walking. She is on Plavix from prior retinal artery occlusion. She reports increase in stress at home due to multiple sick family members, she has been taking Effexor and Xanax without much improvement. She is asymptomatic at the time of my evaluation.    Home Medications Prior to Admission medications   Medication Sig Start Date End Date Taking? Authorizing Provider  ALPRAZolam (XANAX) 1 MG tablet TAKE 1/2 TO 1 TABLET BY MOUTH 2 TIMES DAILY AS NEEDED FOR ANXIETY 04/29/21   Plotnikov, Evie Lacks, MD  anastrozole (ARIMIDEX) 1 MG tablet TAKE 1 TABLET BY MOUTH EVERY DAY 11/13/20   Magrinat, [name], MD  benzonatate (TESSALON) 100 MG capsule Take 1 capsule (100 mg total) by mouth every 8 (eight) hours as needed for cough. 08/10/21   Teodora Medici, FNP  budesonide-formoterol [facility]) 160-4.5 MCG/ACT inhaler Inhale 2 puffs into the lungs in the morning and at bedtime. 08/10/21   Teodora Medici, FNP  cholecalciferol (VITAMIN D) 1000 units tablet Take 2 tablets (2,000 Units total) by mouth daily. 06/19/17   Plotnikov, Evie Lacks, MD  clopidogrel (PLAVIX) 75 MG tablet Take 1 tablet (75 mg total) by mouth daily. 09/28/20   Plotnikov, Evie Lacks, MD  levothyroxine (SYNTHROID) 112 MCG tablet TAKE 1 TABLET (112 MCG TOTAL) BY MOUTH DAILY. 10/26/20   Plotnikov, Evie Lacks, MD  meclizine (ANTIVERT) 25 MG tablet Take 1 tablet (25 mg total) by mouth 3 (three) times daily as needed for dizziness. Patient not taking: Reported on 07/12/2021 11/20/20   Plotnikov,  Evie Lacks, MD  rosuvastatin (CRESTOR) 20 MG tablet Take 1 tablet (20 mg total) by mouth daily. 05/08/21   Gardenia Phlegm, NP  telmisartan (MICARDIS) 20 MG tablet Take 1 tablet (20 mg total) by mouth daily. 04/26/21   Plotnikov, Evie Lacks, MD  venlafaxine XR (EFFEXOR XR) 150 MG 24 hr capsule Take 1 capsule (150 mg total) by mouth daily with breakfast. 06/18/21   Plotnikov, Evie Lacks, MD  vitamin B-12 (CYANOCOBALAMIN) 1000 MCG tablet Take 1,000 mcg by mouth daily.    [provider]     Allergies    Pneumococcal vaccines, Sulfa antibiotics, Sulfacetamide sodium-sulfur, Sulfamethoxazole-trimethoprim, Tape, and Tegaderm ag mesh [silver]   Review of Systems   Review of Systems Please see HPI for pertinent positives and negatives  Physical Exam BP 116/78 (BP Location: Left Arm)   Pulse (!) 101   Temp 98.6 F (37 C) (Oral)   Resp 16   SpO2 97%   Physical Exam Vitals and nursing note reviewed.  Constitutional:      Appearance: Normal appearance.  HENT:     Head: Normocephalic and atraumatic.     Nose: Nose normal.     Mouth/Throat:     Mouth: Mucous membranes are moist.  Eyes:     Extraocular Movements: Extraocular movements intact.     Conjunctiva/sclera: Conjunctivae normal.  Cardiovascular:     Rate and Rhythm: Normal rate.  Pulmonary:     Effort: Pulmonary effort is normal.  Breath sounds: Normal breath sounds.  Abdominal:     General: Abdomen is flat.     Palpations: Abdomen is soft.     Tenderness: There is no abdominal tenderness.  Musculoskeletal:        General: No swelling. Normal range of motion.     Cervical back: Neck supple.  Skin:    General: Skin is warm and dry.  Neurological:     General: No focal deficit present.     Mental Status: She is alert and oriented to person, place, and time.     Cranial Nerves: No cranial nerve deficit.     Sensory: No sensory deficit.     Motor: No weakness.     Coordination: Coordination normal.      Gait: Gait normal.  Psychiatric:        Mood and Affect: Mood normal.     ED Results / Procedures / Treatments   EKG EKG Interpretation  Date/Time:  Thursday August 16 2021 19:57:35 EDT Ventricular Rate:  109 PR Interval:  146 QRS Duration: 58 QT Interval:  322 QTC Calculation: 433 R Axis:   -13 Text Interpretation: Sinus tachycardia Possible Inferior infarct , age undetermined Anterior infarct , age undetermined Abnormal ECG When compared with ECG of 21-Aug-2020 14:14, Rate faster Confirmed by Calvert Cantor [phone]) on 08/17/2021 1:59:05 AM  Procedures Procedures  Medications Ordered in the ED Medications  iohexol (OMNIPAQUE) 350 MG/ML injection 60 mL (60 mLs Intravenous Contrast Given 08/17/21 0139)    Initial Impression and Plan  Patient here to rule out subacute stroke due to speech difficulty from >12 hours prior to arrival. She had labs and imaging studies done in triage showing normal CBC, BMP, coags, EtOH and UA. UDS positive for benzos as expected. I personally viewed the images from radiology studies and agree with radiologist interpretation: MRI and CTA are neg for acute stroke or large vessel occlusion.   Patient is tearful talking about the things she is dealing with at home. I suspect this has some impact on her symptoms. She was encouraged to speak with her PCP about a referral to psych/therapist to discuss. Otherwise she has normal neuro exam now, no signs of acute stroke or other emergent condition and safe to discharge.    ED Course       MDM Rules/Calculators/A&P Medical Decision Making Given presenting complaint, I considered that admission might be necessary. After review of results from ED lab and/or imaging studies, admission to the hospital is not indicated at this time.    Problems Addressed: Slurred speech: acute illness or injury  Amount and/or Complexity of Data Reviewed Labs: ordered. Decision-making details documented in ED Course. Radiology:  ordered and independent interpretation performed. Decision-making details documented in ED Course. ECG/medicine tests: ordered and independent interpretation performed. Decision-making details documented in ED Course.  Risk Decision regarding hospitalization.    Final Clinical Impression(s) / ED Diagnoses Final diagnoses:  Slurred speech    Rx / DC Orders ED Discharge Orders     None        Truddie Hidden, MD 08/17/21 [phone]

## 2021-08-17 NOTE — ED Notes (Signed)
Pt verbalized understanding of d/c instructions, meds, and followup care. Denies questions. VSS, no distress noted. Steady gait to exit with all belongings.  ?

## 2021-08-17 NOTE — ED Notes (Signed)
Patient transported to MRI 

## 2021-08-19 ENCOUNTER — Other Ambulatory Visit: Payer: Self-pay | Admitting: Internal Medicine

## 2021-08-24 ENCOUNTER — Ambulatory Visit: Payer: Medicare Other

## 2021-08-27 ENCOUNTER — Ambulatory Visit (INDEPENDENT_AMBULATORY_CARE_PROVIDER_SITE_OTHER): Payer: Medicare Other | Admitting: Internal Medicine

## 2021-08-27 ENCOUNTER — Encounter: Payer: Self-pay | Admitting: Internal Medicine

## 2021-08-27 VITALS — BP 126/80 | HR 105 | Temp 97.6°F | Ht 63.0 in | Wt 142.8 lb

## 2021-08-27 DIAGNOSIS — E538 Deficiency of other specified B group vitamins: Secondary | ICD-10-CM

## 2021-08-27 DIAGNOSIS — F4321 Adjustment disorder with depressed mood: Secondary | ICD-10-CM | POA: Diagnosis not present

## 2021-08-27 DIAGNOSIS — F419 Anxiety disorder, unspecified: Secondary | ICD-10-CM | POA: Diagnosis not present

## 2021-08-27 DIAGNOSIS — E213 Hyperparathyroidism, unspecified: Secondary | ICD-10-CM | POA: Diagnosis not present

## 2021-08-27 DIAGNOSIS — E559 Vitamin D deficiency, unspecified: Secondary | ICD-10-CM

## 2021-08-27 DIAGNOSIS — E890 Postprocedural hypothyroidism: Secondary | ICD-10-CM

## 2021-08-27 DIAGNOSIS — R0609 Other forms of dyspnea: Secondary | ICD-10-CM

## 2021-08-27 DIAGNOSIS — R5382 Chronic fatigue, unspecified: Secondary | ICD-10-CM

## 2021-08-27 DIAGNOSIS — I679 Cerebrovascular disease, unspecified: Secondary | ICD-10-CM

## 2021-08-27 MED ORDER — GUAIFENESIN ER 600 MG PO TB12: 600.0000 mg | ORAL_TABLET | Freq: Two times a day (BID) | ORAL | 1 refills | Status: DC

## 2021-08-27 NOTE — Assessment & Plan Note (Signed)
Cont w/Xanax prn  Potential benefits of a long term benzodiazepines  use as well as potential risks  and complications were explained to the patient and were aknowledged. Take Xanax twice a day if needed Psychology ref is offered Arman Filter was offered Worse - will increase Effexor XR to 150 mg/d -- 3/23

## 2021-08-27 NOTE — Assessment & Plan Note (Signed)
Worse Pt was ref to see Dr Toy Care

## 2021-08-27 NOTE — Assessment & Plan Note (Signed)
Reviewed ER notes w/the pt - Per Dr Karle Starch "ED from [facility] for speech difficulty onset the morning prior of arrival when she reports having difficulty getting some words out. She was having some neck pain when she woke up as well. No blurry vision, numbness, weakness or difficulty walking. She is on Plavix from prior retinal artery occlusion. She reports increase in stress at home due to multiple sick family members, she has been taking Effexor and Xanax without much improvement. She is asymptomatic at the time of my evaluation. "  "rule out subacute stroke due to speech difficulty from >12 hours prior to arrival. She had labs and imaging studies done in triage showing normal CBC, BMP, coags, EtOH and UA. UDS positive for benzos as expected. I personally viewed the images from radiology studies and agree with radiologist interpretation: MRI and CTA are neg for acute stroke or large vessel occlusion.    Patient is tearful talking about the things she is dealing with at home. I suspect this has some impact on her symptoms. She was encouraged to speak with her PCP about a referral to psych/therapist to discuss. Otherwise she has normal neuro exam now, no signs of acute stroke or other emergent condition and safe to discharge. "

## 2021-08-27 NOTE — Assessment & Plan Note (Signed)
On B12 

## 2021-08-27 NOTE — Assessment & Plan Note (Signed)
Cough. CXR was ok Pt stopped MDI - did not like it Start Musinex bid

## 2021-08-27 NOTE — Assessment & Plan Note (Signed)
Monitor PTH, calcium

## 2021-08-27 NOTE — Assessment & Plan Note (Addendum)
On Levothyroxine Monitor TSH, PTH, calcium

## 2021-08-27 NOTE — Assessment & Plan Note (Signed)
On Vit D 

## 2021-08-27 NOTE — Progress Notes (Signed)
Subjective:  Patient ID: [name] [name], female    DOB: [birth year]  Age: 81 y.o. MRN: [id number]  CC: 3 month f/u (She has been feeling dizzy for the last couple of months. She would like to know what is wrong with her. )   HPI [name] [name] presents for 3 month f/u she has been feeling dizzy for the last couple of months. She would like to know what is wrong with her.   Per Dr Karle Starch "ED from [facility] for speech difficulty onset the morning prior of arrival when she reports having difficulty getting some words out. She was having some neck pain when she woke up as well. No blurry vision, numbness, weakness or difficulty walking. She is on Plavix from prior retinal artery occlusion. She reports increase in stress at home due to multiple sick family members, she has been taking Effexor and Xanax without much improvement. She is asymptomatic at the time of my evaluation. "  "rule out subacute stroke due to speech difficulty from >12 hours prior to arrival. She had labs and imaging studies done in triage showing normal CBC, BMP, coags, EtOH and UA. UDS positive for benzos as expected. I personally viewed the images from radiology studies and agree with radiologist interpretation: MRI and CTA are neg for acute stroke or large vessel occlusion.    Patient is tearful talking about the things she is dealing with at home. I suspect this has some impact on her symptoms. She was encouraged to speak with her PCP about a referral to psych/therapist to discuss. Otherwise she has normal neuro exam now, no signs of acute stroke or other emergent condition and safe to discharge. "  F/u on grief: sister died in 2021/04/30, husband w/Parkinsons' F/u on depression, anxiety  F/u on hyperparathyroidism  Outpatient Medications Prior to Visit  Medication Sig Dispense Refill   ALPRAZolam (XANAX) 1 MG tablet TAKE 1/2 TO 1 TABLET BY MOUTH 2 TIMES DAILY AS NEEDED FOR ANXIETY 60 tablet 2   anastrozole (ARIMIDEX) 1 MG tablet  TAKE 1 TABLET BY MOUTH EVERY DAY 90 tablet 4   benzonatate (TESSALON) 100 MG capsule Take 1 capsule (100 mg total) by mouth every 8 (eight) hours as needed for cough. 21 capsule 0   budesonide-formoterol (SYMBICORT) 160-4.5 MCG/ACT inhaler Inhale 2 puffs into the lungs in the morning and at bedtime. 1 each 12   cholecalciferol (VITAMIN D) 1000 units tablet Take 2 tablets (2,000 Units total) by mouth daily. 100 tablet 3   clopidogrel (PLAVIX) 75 MG tablet Take 1 tablet (75 mg total) by mouth daily. 90 tablet 3   levothyroxine (SYNTHROID) 112 MCG tablet TAKE 1 TABLET (112 MCG TOTAL) BY MOUTH DAILY. 90 tablet 3   rosuvastatin (CRESTOR) 20 MG tablet TAKE 1 TABLET BY MOUTH EVERY DAY 90 tablet 1   telmisartan (MICARDIS) 20 MG tablet Take 1 tablet (20 mg total) by mouth daily. 90 tablet 3   venlafaxine XR (EFFEXOR XR) 150 MG 24 hr capsule Take 1 capsule (150 mg total) by mouth daily with breakfast. 90 capsule 1   vitamin B-12 (CYANOCOBALAMIN) 1000 MCG tablet Take 1,000 mcg by mouth daily.     meclizine (ANTIVERT) 25 MG tablet Take 1 tablet (25 mg total) by mouth 3 (three) times daily as needed for dizziness. (Patient not taking: Reported on 07/12/2021) 30 tablet 0   No facility-administered medications prior to visit.    ROS: Review of Systems  Constitutional:  Positive for fatigue and unexpected  weight change. Negative for activity change, appetite change and chills.  HENT:  Negative for congestion, mouth sores and sinus pressure.   Eyes:  Negative for visual disturbance.  Respiratory:  Negative for cough and chest tightness.   Gastrointestinal:  Negative for abdominal pain and nausea.  Genitourinary:  Negative for difficulty urinating, frequency and vaginal pain.  Musculoskeletal:  Negative for back pain and gait problem.  Skin:  Negative for pallor and rash.  Neurological:  Negative for dizziness, tremors, weakness, numbness and headaches.  Psychiatric/Behavioral:  Positive for decreased  concentration and dysphoric mood. Negative for confusion, sleep disturbance and suicidal ideas. The patient is nervous/anxious.     Objective:  BP 126/80   Pulse (!) 105   Temp 97.6 F (36.4 C) (Oral)   Ht '5\' 3"'$  (1.6 m)   Wt 142 lb 12.8 oz (64.8 kg)   SpO2 98%   BMI 25.30 kg/m   BP Readings from Last 3 Encounters:  08/27/21 126/80  08/17/21 118/64  08/16/21 116/78    Wt Readings from Last 3 Encounters:  08/27/21 142 lb 12.8 oz (64.8 kg)  06/18/21 144 lb (65.3 kg)  05/08/21 143 lb 6.4 oz (65 kg)    Physical Exam Constitutional:      General: She is not in acute distress.    Appearance: She is well-developed.  HENT:     Head: Normocephalic.     Right Ear: External ear normal.     Left Ear: External ear normal.     Nose: Nose normal.  Eyes:     General:        Right eye: No discharge.        Left eye: No discharge.     Conjunctiva/sclera: Conjunctivae normal.     Pupils: Pupils are equal, round, and reactive to light.  Neck:     Thyroid: No thyromegaly.     Vascular: No JVD.     Trachea: No tracheal deviation.  Cardiovascular:     Rate and Rhythm: Normal rate and regular rhythm.     Heart sounds: Normal heart sounds.  Pulmonary:     Effort: No respiratory distress.     Breath sounds: No stridor. No wheezing.  Abdominal:     General: Bowel sounds are normal. There is no distension.     Palpations: Abdomen is soft. There is no mass.     Tenderness: There is no abdominal tenderness. There is no guarding or rebound.  Musculoskeletal:        General: No tenderness.     Cervical back: Normal range of motion and neck supple. No rigidity.  Lymphadenopathy:     Cervical: No cervical adenopathy.  Skin:    Findings: No erythema or rash.  Neurological:     Cranial Nerves: No cranial nerve deficit.     Motor: No abnormal muscle tone.     Coordination: Coordination normal.     Deep Tendon Reflexes: Reflexes normal.  Psychiatric:        Behavior: Behavior normal.         Thought Content: Thought content normal.        Judgment: Judgment normal.     A total time of 45 minutes was spent preparing to see the patient, reviewing tests, x-rays, operative reports and other medical records.  Also, obtaining history and performing comprehensive physical exam.  Additionally, counseling the patient regarding the above listed issues - depression, anxiety.   Finally, documenting clinical information in the health records, coordination  of care, educating the patient.    Lab Results  Component Value Date   WBC 6.9 08/16/2021   HGB 12.3 08/16/2021   HCT 37.9 08/16/2021   PLT 386 08/16/2021   GLUCOSE 119 (H) 08/28/2021   CHOL 134 08/10/2020   TRIG 263 (H) 08/10/2020   HDL 37 (L) 08/10/2020   LDLDIRECT 125.0 06/05/2015   LDLCALC 44 08/10/2020   ALT 14 08/28/2021   AST 22 08/28/2021   NA 137 08/28/2021   K 4.1 08/28/2021   CL 101 08/28/2021   CREATININE 1.13 08/28/2021   BUN 18 08/28/2021   CO2 28 08/28/2021   TSH 0.02 (L) 08/28/2021   INR 1.1 08/16/2021   HGBA1C 6.4 06/18/2021    CT ANGIO HEAD NECK W WO CM  Result Date: 08/17/2021 CLINICAL DATA:  Aphasia EXAM: CT ANGIOGRAPHY HEAD AND NECK TECHNIQUE: Multidetector CT imaging of the head and neck was performed using the standard protocol during bolus administration of intravenous contrast. Multiplanar CT image reconstructions and MIPs were obtained to evaluate the vascular anatomy. Carotid stenosis measurements (when applicable) are obtained utilizing NASCET criteria, using the distal internal carotid diameter as the denominator. RADIATION DOSE REDUCTION: This exam was performed according to the departmental dose-optimization program which includes automated exposure control, adjustment of the mA and/or kV according to patient size and/or use of iterative reconstruction technique. CONTRAST:  57m OMNIPAQUE IOHEXOL 350 MG/ML SOLN COMPARISON:  Brain MRI 08/17/2021 FINDINGS: CT HEAD FINDINGS Brain: There is no mass,  hemorrhage or extra-axial collection. There is generalized atrophy without lobar predilection. There is hypoattenuation of the periventricular white matter, most commonly indicating chronic ischemic microangiopathy. Skull: The visualized skull base, calvarium and extracranial soft tissues are normal. Sinuses/Orbits: No fluid levels or advanced mucosal thickening of the visualized paranasal sinuses. No mastoid or middle ear effusion. The orbits are normal. CTA NECK FINDINGS SKELETON: There is no bony spinal canal stenosis. No lytic or blastic lesion. OTHER NECK: Normal pharynx, larynx and major salivary glands. No cervical lymphadenopathy. Unremarkable thyroid gland. UPPER CHEST: No pneumothorax or pleural effusion. No nodules or masses. AORTIC ARCH: There is calcific atherosclerosis of the aortic arch. There is no aneurysm, dissection or hemodynamically significant stenosis of the visualized portion of the aorta. Conventional 3 vessel aortic branching pattern. The visualized proximal subclavian arteries are widely patent. RIGHT CAROTID SYSTEM: Normal without aneurysm, dissection or stenosis. LEFT CAROTID SYSTEM: Normal without aneurysm, dissection or stenosis. VERTEBRAL ARTERIES: Left dominant configuration. Both origins are clearly patent. There is no dissection, occlusion or flow-limiting stenosis to the skull base (V1-V3 segments). CTA HEAD FINDINGS POSTERIOR CIRCULATION: --Vertebral arteries: Normal V4 segments. --Inferior cerebellar arteries: Normal. --Basilar artery: Normal. --Superior cerebellar arteries: Normal. --Posterior cerebral arteries (PCA): Normal. ANTERIOR CIRCULATION: --Intracranial internal carotid arteries: Normal. --Anterior cerebral arteries (ACA): Normal. Both A1 segments are present. Patent anterior communicating artery (a-comm). --Middle cerebral arteries (MCA): Normal. VENOUS SINUSES: As permitted by contrast timing, patent. ANATOMIC VARIANTS: None Review of the MIP images confirms the  above findings. IMPRESSION: 1. No emergent large vessel occlusion or hemodynamically significant stenosis of the head or neck. 2. Chronic ischemic microangiopathy and generalized atrophy. 3. Aortic Atherosclerosis (ICD10-I70.0). Electronically Signed   By: KUlyses JarredM.D.   On: 08/17/2021 01:57   MR BRAIN WO CONTRAST  Result Date: 08/17/2021 CLINICAL DATA:  Acute neurologic deficit EXAM: MRI HEAD WITHOUT CONTRAST TECHNIQUE: Multiplanar, multiecho pulse sequences of the brain and surrounding structures were obtained without intravenous contrast. COMPARISON:  Brain MRI 08/21/2020 FINDINGS: Brain:  No acute infarct, mass effect or extra-axial collection. No acute or chronic hemorrhage. There is multifocal hyperintense T2-weighted signal within the white matter. Generalized volume loss. The midline structures are normal. Vascular: Major flow voids are preserved. Skull and upper cervical spine: Normal calvarium and skull base. Visualized upper cervical spine and soft tissues are normal. Sinuses/Orbits:No paranasal sinus fluid levels or advanced mucosal thickening. No mastoid or middle ear effusion. Normal orbits. IMPRESSION: 1. No acute intracranial abnormality. 2. Findings of chronic microvascular ischemia and volume loss. Electronically Signed   By: Ulyses Jarred M.D.   On: 08/17/2021 01:33    Assessment & Plan:   Problem List Items Addressed This Visit     Anxiety disorder    Cont w/Xanax prn  Potential benefits of a long term benzodiazepines  use as well as potential risks  and complications were explained to the patient and were aknowledged. Take Xanax twice a day if needed Psychology ref is offered Arman Filter was offered Worse - will increase Effexor XR to 150 mg/d -- 3/23      Relevant Orders   Ambulatory referral to Psychiatry   B12 deficiency    On B12      Cerebrovascular disease    Reviewed ER notes w/the pt - Per Dr Karle Starch "ED from [facility] for speech difficulty onset the morning prior of  arrival when she reports having difficulty getting some words out. She was having some neck pain when she woke up as well. No blurry vision, numbness, weakness or difficulty walking. She is on Plavix from prior retinal artery occlusion. She reports increase in stress at home due to multiple sick family members, she has been taking Effexor and Xanax without much improvement. She is asymptomatic at the time of my evaluation. "  "rule out subacute stroke due to speech difficulty from >12 hours prior to arrival. She had labs and imaging studies done in triage showing normal CBC, BMP, coags, EtOH and UA. UDS positive for benzos as expected. I personally viewed the images from radiology studies and agree with radiologist interpretation: MRI and CTA are neg for acute stroke or large vessel occlusion.    Patient is tearful talking about the things she is dealing with at home. I suspect this has some impact on her symptoms. She was encouraged to speak with her PCP about a referral to psych/therapist to discuss. Otherwise she has normal neuro exam now, no signs of acute stroke or other emergent condition and safe to discharge. "      Chronic fatigue    Chronic Denies OSA      DOE (dyspnea on exertion)    Cough. CXR was ok Pt stopped MDI - did not like it Start Musinex bid      Hyperparathyroidism ([place])    Monitor PTH, calcium      Relevant Orders   Comprehensive metabolic panel (Completed)   TSH (Completed)   PTH, intact and calcium (Completed)   HYPOTHYROIDISM, POSTSURGICAL    On Levothyroxine Monitor TSH, PTH, calcium      Relevant Orders   Comprehensive metabolic panel (Completed)   TSH (Completed)   PTH, intact and calcium (Completed)   Situational depression    Worse Pt was ref to see Dr Toy Care      Vitamin D deficiency    On Vit D      Other Visit Diagnoses     Grieving    -  Primary   Relevant Orders   Ambulatory referral to Psychiatry  Meds ordered this  encounter  Medications   guaiFENesin (MUCINEX) 600 MG 12 hr tablet    Sig: Take 1 tablet (600 mg total) by mouth 2 (two) times daily.    Dispense:  60 tablet    Refill:  1      Follow-up: Return in about 3 months (around 11/27/2021) for a follow-up visit.  Walker Kehr, MD

## 2021-08-27 NOTE — Assessment & Plan Note (Signed)
Chronic Denies OSA

## 2021-08-28 ENCOUNTER — Ambulatory Visit
Admission: RE | Admit: 2021-08-28 | Discharge: 2021-08-28 | Disposition: A | Discharge status: Home / Self Care | Payer: Medicare Other | Source: Ambulatory Visit | Attending: Internal Medicine | Admitting: Internal Medicine

## 2021-08-28 DIAGNOSIS — E213 Hyperparathyroidism, unspecified: Secondary | ICD-10-CM | POA: Diagnosis not present

## 2021-08-28 DIAGNOSIS — Z1231 Encounter for screening mammogram for malignant neoplasm of breast: Secondary | ICD-10-CM | POA: Diagnosis not present

## 2021-08-28 DIAGNOSIS — E890 Postprocedural hypothyroidism: Secondary | ICD-10-CM | POA: Diagnosis not present

## 2021-08-28 LAB — COMPREHENSIVE METABOLIC PANEL
ALT: 14 U/L (ref 0–35)
AST: 22 U/L (ref 0–37)
Albumin: 4.1 g/dL (ref 3.5–5.2)
Alkaline Phosphatase: 63 U/L (ref 39–117)
BUN: 18 mg/dL (ref 6–23)
CO2: 28 mEq/L (ref 19–32)
Calcium: 9.9 mg/dL (ref 8.4–10.5)
Chloride: 101 mEq/L (ref 96–112)
Creatinine, Ser: 1.13 mg/dL (ref 0.40–1.20)
GFR: 45.34 mL/min — ABNORMAL LOW (ref >60.00)
Glucose, Bld: 119 mg/dL — ABNORMAL HIGH (ref 70–99)
Potassium: 4.1 mEq/L (ref 3.5–5.1)
Sodium: 137 mEq/L (ref 135–145)
Total Bilirubin: 0.5 mg/dL (ref 0.2–1.2)
Total Protein: 7.5 g/dL (ref 6.0–8.3)

## 2021-08-28 LAB — TSH: TSH: 0.02 u[IU]/mL — ABNORMAL LOW (ref 0.35–5.50)

## 2021-08-29 ENCOUNTER — Other Ambulatory Visit (INDEPENDENT_AMBULATORY_CARE_PROVIDER_SITE_OTHER): Payer: Medicare Other

## 2021-08-29 DIAGNOSIS — E213 Hyperparathyroidism, unspecified: Secondary | ICD-10-CM | POA: Diagnosis not present

## 2021-08-29 LAB — T4, FREE: Free T4: 1.68 ng/dL — ABNORMAL HIGH (ref 0.60–1.60)

## 2021-08-30 ENCOUNTER — Other Ambulatory Visit: Payer: Self-pay | Admitting: Internal Medicine

## 2021-08-30 DIAGNOSIS — E039 Hypothyroidism, unspecified: Secondary | ICD-10-CM

## 2021-08-31 LAB — PTH, INTACT AND CALCIUM
Calcium: 9.8 mg/dL (ref 8.6–10.4)
PTH: 76 pg/mL (ref 16–77)

## 2021-09-20 ENCOUNTER — Ambulatory Visit: Payer: Medicare Other | Admitting: Internal Medicine

## 2021-10-01 ENCOUNTER — Other Ambulatory Visit: Payer: Medicare Other

## 2021-10-01 ENCOUNTER — Other Ambulatory Visit (INDEPENDENT_AMBULATORY_CARE_PROVIDER_SITE_OTHER): Payer: Medicare Other

## 2021-10-01 ENCOUNTER — Ambulatory Visit (INDEPENDENT_AMBULATORY_CARE_PROVIDER_SITE_OTHER): Payer: Medicare Other | Admitting: Internal Medicine

## 2021-10-01 ENCOUNTER — Encounter: Payer: Self-pay | Admitting: Internal Medicine

## 2021-10-01 DIAGNOSIS — D485 Neoplasm of uncertain behavior of skin: Secondary | ICD-10-CM

## 2021-10-01 DIAGNOSIS — E039 Hypothyroidism, unspecified: Secondary | ICD-10-CM | POA: Diagnosis not present

## 2021-10-01 DIAGNOSIS — F4321 Adjustment disorder with depressed mood: Secondary | ICD-10-CM

## 2021-10-01 DIAGNOSIS — H2513 Age-related nuclear cataract, bilateral: Secondary | ICD-10-CM | POA: Diagnosis not present

## 2021-10-01 DIAGNOSIS — I10 Essential (primary) hypertension: Secondary | ICD-10-CM

## 2021-10-01 DIAGNOSIS — M818 Other osteoporosis without current pathological fracture: Secondary | ICD-10-CM

## 2021-10-01 DIAGNOSIS — E559 Vitamin D deficiency, unspecified: Secondary | ICD-10-CM

## 2021-10-01 DIAGNOSIS — H40033 Anatomical narrow angle, bilateral: Secondary | ICD-10-CM | POA: Diagnosis not present

## 2021-10-01 DIAGNOSIS — E538 Deficiency of other specified B group vitamins: Secondary | ICD-10-CM

## 2021-10-01 DIAGNOSIS — R270 Ataxia, unspecified: Secondary | ICD-10-CM

## 2021-10-01 DIAGNOSIS — F419 Anxiety disorder, unspecified: Secondary | ICD-10-CM | POA: Diagnosis not present

## 2021-10-01 LAB — T4, FREE: Free T4: 0.61 ng/dL (ref 0.60–1.60)

## 2021-10-01 LAB — TSH: TSH: 40.96 u[IU]/mL — ABNORMAL HIGH (ref 0.35–5.50)

## 2021-10-01 NOTE — Patient Instructions (Signed)
Try GLYTONE exfoliating body lotion by Pierre Fabre (free acid value 17.5)  

## 2021-10-01 NOTE — Assessment & Plan Note (Signed)
EFFEXOR - better on 150 MG/D

## 2021-10-01 NOTE — Assessment & Plan Note (Signed)
Cont w/Vit D 

## 2021-10-01 NOTE — Assessment & Plan Note (Signed)
Cont w/Vit B12 

## 2021-10-01 NOTE — Assessment & Plan Note (Addendum)
Grieving/sadness is better... EFFEXOR XR - on 150 MG/D

## 2021-10-01 NOTE — Assessment & Plan Note (Signed)
Re-start Telmisartan at lower dose

## 2021-10-01 NOTE — Progress Notes (Signed)
Subjective:  Patient ID: [name] [name], female    DOB: 1940-09-11  Age: 81 y.o. MRN: [id number]  CC: Follow-up   HPI Patrisia Faeth Macfadden presents for L hand skin lesion - worse F/u depression, anxiety, grief Grieving/sadness is better...  Outpatient Medications Prior to Visit  Medication Sig Dispense Refill   ALPRAZolam (XANAX) 1 MG tablet TAKE 1/2 TO 1 TABLET BY MOUTH 2 TIMES DAILY AS NEEDED FOR ANXIETY 60 tablet 2   anastrozole (ARIMIDEX) 1 MG tablet TAKE 1 TABLET BY MOUTH EVERY DAY 90 tablet 4   cholecalciferol (VITAMIN D) 1000 units tablet Take 2 tablets (2,000 Units total) by mouth daily. 100 tablet 3   clopidogrel (PLAVIX) 75 MG tablet Take 1 tablet (75 mg total) by mouth daily. 90 tablet 3   guaiFENesin (MUCINEX) 600 MG 12 hr tablet Take 1 tablet (600 mg total) by mouth 2 (two) times daily. 60 tablet 1   levothyroxine (SYNTHROID) 112 MCG tablet TAKE 1 TABLET (112 MCG TOTAL) BY MOUTH DAILY. 90 tablet 3   rosuvastatin (CRESTOR) 20 MG tablet TAKE 1 TABLET BY MOUTH EVERY DAY 90 tablet 1   telmisartan (MICARDIS) 20 MG tablet Take 1 tablet (20 mg total) by mouth daily. 90 tablet 3   venlafaxine XR (EFFEXOR XR) 150 MG 24 hr capsule Take 1 capsule (150 mg total) by mouth daily with breakfast. 90 capsule 1   vitamin B-12 (CYANOCOBALAMIN) 1000 MCG tablet Take 1,000 mcg by mouth daily.     benzonatate (TESSALON) 100 MG capsule Take 1 capsule (100 mg total) by mouth every 8 (eight) hours as needed for cough. (Patient not taking: Reported on 10/01/2021) 21 capsule 0   budesonide-formoterol (SYMBICORT) 160-4.5 MCG/ACT inhaler Inhale 2 puffs into the lungs in the morning and at bedtime. (Patient not taking: Reported on 10/01/2021) 1 each 12   meclizine (ANTIVERT) 25 MG tablet Take 1 tablet (25 mg total) by mouth 3 (three) times daily as needed for dizziness. (Patient not taking: Reported on 07/12/2021) 30 tablet 0   No facility-administered medications prior to visit.    ROS: Review of Systems   Constitutional:  Positive for fatigue. Negative for activity change, appetite change, chills and unexpected weight change.  HENT:  Negative for congestion, mouth sores and sinus pressure.   Eyes:  Negative for visual disturbance.  Respiratory:  Negative for cough and chest tightness.   Gastrointestinal:  Negative for abdominal pain and nausea.  Genitourinary:  Negative for difficulty urinating, frequency and vaginal pain.  Musculoskeletal:  Positive for arthralgias. Negative for back pain and gait problem.  Skin:  Negative for pallor and rash.  Neurological:  Negative for dizziness, tremors, weakness, numbness and headaches.  Psychiatric/Behavioral:  Positive for decreased concentration and dysphoric mood. Negative for confusion, sleep disturbance and suicidal ideas. The patient is nervous/anxious.     Objective:  BP 132/70 (BP Location: Left Arm)   Pulse (!) 102   Temp 98 F (36.7 C) (Oral)   Ht '5\' 3"'$  (1.6 m)   Wt 143 lb (64.9 kg)   SpO2 98%   BMI 25.33 kg/m   BP Readings from Last 3 Encounters:  10/01/21 132/70  08/27/21 126/80  08/17/21 118/64    Wt Readings from Last 3 Encounters:  10/01/21 143 lb (64.9 kg)  08/27/21 142 lb 12.8 oz (64.8 kg)  06/18/21 144 lb (65.3 kg)    Physical Exam Constitutional:      General: She is not in acute distress.    Appearance: Normal  appearance. She is well-developed.  HENT:     Head: Normocephalic.     Right Ear: External ear normal.     Left Ear: External ear normal.     Nose: Nose normal.  Eyes:     General:        Right eye: No discharge.        Left eye: No discharge.     Conjunctiva/sclera: Conjunctivae normal.     Pupils: Pupils are equal, round, and reactive to light.  Neck:     Thyroid: No thyromegaly.     Vascular: No JVD.     Trachea: No tracheal deviation.  Cardiovascular:     Rate and Rhythm: Normal rate and regular rhythm.     Heart sounds: Normal heart sounds.  Pulmonary:     Effort: No respiratory  distress.     Breath sounds: No stridor. No wheezing.  Abdominal:     General: Bowel sounds are normal. There is no distension.     Palpations: Abdomen is soft. There is no mass.     Tenderness: There is no abdominal tenderness. There is no guarding or rebound.  Musculoskeletal:        General: No tenderness.     Cervical back: Normal range of motion and neck supple. No rigidity.  Lymphadenopathy:     Cervical: No cervical adenopathy.  Skin:    Findings: No erythema or rash.  Neurological:     Mental Status: She is oriented to person, place, and time.     Cranial Nerves: No cranial nerve deficit.     Motor: No abnormal muscle tone.     Coordination: Coordination normal.     Deep Tendon Reflexes: Reflexes normal.  Psychiatric:        Behavior: Behavior normal.        Thought Content: Thought content normal.        Judgment: Judgment normal.   L hand lesion 11x10 mm  Lab Results  Component Value Date   WBC 6.9 08/16/2021   HGB 12.3 08/16/2021   HCT 37.9 08/16/2021   PLT 386 08/16/2021   GLUCOSE 119 (H) 08/28/2021   CHOL 134 08/10/2020   TRIG 263 (H) 08/10/2020   HDL 37 (L) 08/10/2020   LDLDIRECT 125.0 06/05/2015   LDLCALC 44 08/10/2020   ALT 14 08/28/2021   AST 22 08/28/2021   NA 137 08/28/2021   K 4.1 08/28/2021   CL 101 08/28/2021   CREATININE 1.13 08/28/2021   BUN 18 08/28/2021   CO2 28 08/28/2021   TSH 0.02 (L) 08/28/2021   INR 1.1 08/16/2021   HGBA1C 6.4 06/18/2021    MM 3D SCREEN BREAST UNI LEFT  Result Date: 08/30/2021 CLINICAL DATA:  Screening. EXAM: DIGITAL SCREENING UNILATERAL LEFT MAMMOGRAM WITH CAD AND TOMOSYNTHESIS TECHNIQUE: Left screening digital craniocaudal and mediolateral oblique mammograms were obtained. Left screening digital breast tomosynthesis was performed. The images were evaluated with computer-aided detection. COMPARISON:  Previous exam(s). ACR Breast Density Category b: There are scattered areas of fibroglandular density. FINDINGS: There  are no findings suspicious for malignancy. IMPRESSION: No mammographic evidence of malignancy. A result letter of this screening mammogram will be mailed directly to the patient. RECOMMENDATION: Screening mammogram in one year. (Code:SM-B-01Y) BI-RADS CATEGORY  1: Negative. Electronically Signed   By: Lajean Manes M.D.   On: 08/30/2021 10:22   Assessment & Plan:   Problem List Items Addressed This Visit     Anxiety disorder    EFFEXOR - better  on 150 MG/D      Ataxia    PT offered - declined Brain scan if not better Not drinking at all      B12 deficiency    Cont w/Vit B12      Essential hypertension    Re-start Telmisartan at lower dose      Neoplasm of uncertain behavior of skin    L hand lesion 11x10 mm Will sch skin bx      Osteoporosis    On Prolia at the Cancer center      Situational depression    Grieving/sadness is better... EFFEXOR XR - on 150 MG/D      Vitamin D deficiency     Cont w/Vit D         No orders of the defined types were placed in this encounter.     Follow-up: Return in about 3 months (around 01/01/2022) for a follow-up visit.  Walker Kehr, MD

## 2021-10-01 NOTE — Assessment & Plan Note (Signed)
On Prolia at the Cancer center

## 2021-10-01 NOTE — Assessment & Plan Note (Signed)
PT offered - declined Brain scan if not better Not drinking at all

## 2021-10-01 NOTE — Assessment & Plan Note (Signed)
L hand lesion 11x10 mm Will sch skin bx

## 2021-10-03 ENCOUNTER — Other Ambulatory Visit (INDEPENDENT_AMBULATORY_CARE_PROVIDER_SITE_OTHER): Payer: Medicare Other

## 2021-10-03 DIAGNOSIS — E213 Hyperparathyroidism, unspecified: Secondary | ICD-10-CM

## 2021-10-03 LAB — T3, FREE: T3, Free: 2.1 pg/mL — ABNORMAL LOW (ref 2.3–4.2)

## 2021-10-09 ENCOUNTER — Other Ambulatory Visit: Payer: Self-pay | Admitting: Internal Medicine

## 2021-10-09 DIAGNOSIS — R5382 Chronic fatigue, unspecified: Secondary | ICD-10-CM

## 2021-10-09 DIAGNOSIS — F4321 Adjustment disorder with depressed mood: Secondary | ICD-10-CM

## 2021-10-09 DIAGNOSIS — E890 Postprocedural hypothyroidism: Secondary | ICD-10-CM

## 2021-10-09 MED ORDER — LIOTHYRONINE SODIUM 25 MCG PO TABS: 25.0000 ug | ORAL_TABLET | Freq: Every day | ORAL | 5 refills | Status: DC

## 2021-10-10 ENCOUNTER — Ambulatory Visit: Payer: Medicare Other | Admitting: Internal Medicine

## 2021-10-11 ENCOUNTER — Ambulatory Visit: Payer: Medicare Other | Admitting: Internal Medicine

## 2021-10-12 ENCOUNTER — Telehealth: Payer: Self-pay

## 2021-10-12 NOTE — Telephone Encounter (Signed)
Pt is calling after picking up the medication  liothyronine (CYTOMEL) 25 MCG tablet.  Pharmacy staff advised the pt husband that the pt should not take levothyroxine (SYNTHROID) 112 MCG tablet and liothyronine (CYTOMEL) 25 MCG tablet.  Please advise if its ok to take the medications together.

## 2021-10-13 NOTE — Telephone Encounter (Signed)
It is okay to take Cytomel and levothyroxine together under close monitoring.  Keep review office visit.  Do lab work in the beginning of October.  Thank you

## 2021-10-16 NOTE — Telephone Encounter (Signed)
Pt called back I gave the Pt Dr. Alain Marion recommendation stating" It is okay to take Cytomel and levothyroxine together under close monitoring.  Keep review office visit.  Do lab work in the beginning of October.  Thank you"  I reminded pt of her lab appt 11/14/21 at 2 and 3 mo fu 01/01/22 at 2.  FYI

## 2021-10-16 NOTE — Telephone Encounter (Signed)
Left VM for pt to return call.

## 2021-10-17 ENCOUNTER — Ambulatory Visit: Payer: Medicare Other | Admitting: Internal Medicine

## 2021-10-18 ENCOUNTER — Telehealth: Payer: Self-pay | Admitting: Adult Health

## 2021-10-18 NOTE — Telephone Encounter (Signed)
Scheduled appointment per provider PAL. Patient is aware of the changes made to her upcoming appointment.  

## 2021-11-04 ENCOUNTER — Other Ambulatory Visit: Payer: Self-pay | Admitting: Internal Medicine

## 2021-11-06 ENCOUNTER — Telehealth: Payer: Self-pay | Admitting: Internal Medicine

## 2021-11-06 ENCOUNTER — Ambulatory Visit (INDEPENDENT_AMBULATORY_CARE_PROVIDER_SITE_OTHER): Payer: Medicare Other | Admitting: Internal Medicine

## 2021-11-06 ENCOUNTER — Encounter: Payer: Self-pay | Admitting: Internal Medicine

## 2021-11-06 DIAGNOSIS — F419 Anxiety disorder, unspecified: Secondary | ICD-10-CM

## 2021-11-06 DIAGNOSIS — E890 Postprocedural hypothyroidism: Secondary | ICD-10-CM | POA: Diagnosis not present

## 2021-11-06 DIAGNOSIS — F4321 Adjustment disorder with depressed mood: Secondary | ICD-10-CM

## 2021-11-06 DIAGNOSIS — B079 Viral wart, unspecified: Secondary | ICD-10-CM

## 2021-11-06 DIAGNOSIS — R5382 Chronic fatigue, unspecified: Secondary | ICD-10-CM

## 2021-11-06 LAB — TSH: TSH: 0.04 u[IU]/mL — ABNORMAL LOW (ref 0.35–5.50)

## 2021-11-06 LAB — T3, FREE: T3, Free: 3.4 pg/mL (ref 2.3–4.2)

## 2021-11-06 LAB — T4, FREE: Free T4: 1.2 ng/dL (ref 0.60–1.60)

## 2021-11-06 NOTE — Assessment & Plan Note (Signed)
[name] [name] felt better on Synthroid w/Cytomel together (x 1 month) Will cont w/caution Monitor labs closely - FT3, FT4, TSH

## 2021-11-06 NOTE — Assessment & Plan Note (Signed)
Melissia felt better on Synthroid w/Cytomel together (x 1 month) Will cont w/caution Monitor labs closely - FT3, FT4, TSH

## 2021-11-06 NOTE — Progress Notes (Signed)
Subjective:  Patient ID: [name], female    DOB: [birth year]  Age: 81 y.o. MRN: [id number]  CC: Nevus (On (L) hand)   HPI [name] [name] presents for hypothyroidism, fatigue, depression. [name] [name] felt better on Synthroid w/Cytomel together (x 1 month)  Outpatient Medications Prior to Visit  Medication Sig Dispense Refill   ALPRAZolam (XANAX) 1 MG tablet TAKE 1/2 TO 1 TABLET BY MOUTH 2 TIMES DAILY AS NEEDED FOR ANXIETY 60 tablet 2   anastrozole (ARIMIDEX) 1 MG tablet TAKE 1 TABLET BY MOUTH EVERY DAY 90 tablet 4   cholecalciferol (VITAMIN D) 1000 units tablet Take 2 tablets (2,000 Units total) by mouth daily. 100 tablet 3   clopidogrel (PLAVIX) 75 MG tablet TAKE 1 TABLET (75 MG TOTAL) BY MOUTH DAILY FOR 21 DAYS. 90 tablet 3   guaiFENesin (MUCINEX) 600 MG 12 hr tablet Take 1 tablet (600 mg total) by mouth 2 (two) times daily. 60 tablet 1   levothyroxine (SYNTHROID) 112 MCG tablet TAKE 1 TABLET (112 MCG TOTAL) BY MOUTH DAILY. 90 tablet 3   liothyronine (CYTOMEL) 25 MCG tablet Take 1 tablet (25 mcg total) by mouth daily. 30 tablet 5   rosuvastatin (CRESTOR) 20 MG tablet TAKE 1 TABLET BY MOUTH EVERY DAY 90 tablet 1   telmisartan (MICARDIS) 20 MG tablet Take 1 tablet (20 mg total) by mouth daily. 90 tablet 3   venlafaxine XR (EFFEXOR XR) 150 MG 24 hr capsule Take 1 capsule (150 mg total) by mouth daily with breakfast. 90 capsule 1   vitamin B-12 (CYANOCOBALAMIN) 1000 MCG tablet Take 1,000 mcg by mouth daily.     No facility-administered medications prior to visit.     ROS: Review of Systems  Constitutional:  Negative for activity change, appetite change, chills, fatigue and unexpected weight change.  HENT:  Negative for congestion, mouth sores and sinus pressure.   Eyes:  Negative for visual disturbance.  Respiratory:  Negative for cough and chest tightness.   Gastrointestinal:  Negative for abdominal pain and nausea.  Genitourinary:  Negative for difficulty urinating, frequency  and vaginal pain.  Musculoskeletal:  Negative for back pain and gait problem.  Skin:  Negative for pallor and rash.  Neurological:  Negative for dizziness, tremors, weakness, numbness and headaches.  Psychiatric/Behavioral:  Negative for confusion, dysphoric mood and sleep disturbance. The patient is nervous/anxious.     Objective:  BP 120/82 (BP Location: Left Arm)   Pulse (!) 119   Temp 97.7 F (36.5 C) (Oral)   Ht '5\' 3"'$  (1.6 m)   Wt 139 lb 9.6 oz (63.3 kg)   SpO2 95%   BMI 24.73 kg/m   BP Readings from Last 3 Encounters:  11/06/21 120/82  10/01/21 132/70  08/27/21 126/80    Wt Readings from Last 3 Encounters:  11/06/21 139 lb 9.6 oz (63.3 kg)  10/01/21 143 lb (64.9 kg)  08/27/21 142 lb 12.8 oz (64.8 kg)    Physical Exam Constitutional:      General: She is not in acute distress.    Appearance: Normal appearance. She is well-developed.  HENT:     Head: Normocephalic.     Right Ear: External ear normal.     Left Ear: External ear normal.     Nose: Nose normal.  Eyes:     General:        Right eye: No discharge.        Left eye: No discharge.     Conjunctiva/sclera: Conjunctivae normal.  Pupils: Pupils are equal, round, and reactive to light.  Neck:     Thyroid: No thyromegaly.     Vascular: No JVD.     Trachea: No tracheal deviation.  Cardiovascular:     Rate and Rhythm: Normal rate and regular rhythm.     Heart sounds: Normal heart sounds.  Pulmonary:     Effort: No respiratory distress.     Breath sounds: No stridor. No wheezing.  Abdominal:     General: Bowel sounds are normal. There is no distension.     Palpations: Abdomen is soft. There is no mass.     Tenderness: There is no abdominal tenderness. There is no guarding or rebound.  Musculoskeletal:        General: No tenderness.     Cervical back: Normal range of motion and neck supple. No rigidity.  Lymphadenopathy:     Cervical: No cervical adenopathy.  Skin:    Findings: No erythema or  rash.  Neurological:     Cranial Nerves: No cranial nerve deficit.     Motor: No abnormal muscle tone.     Coordination: Coordination normal.     Deep Tendon Reflexes: Reflexes normal.  Psychiatric:        Behavior: Behavior normal.        Thought Content: Thought content normal.        Judgment: Judgment normal.    L hand wart R wrist wart   Procedure Note :     Procedure : Cryosurgery   Indication:  Wart(s)     Risks including unsuccessful procedure , bleeding, infection, bruising, scar, a need for a repeat  procedure and others were explained to the patient in detail as well as the benefits. Informed consent was obtained verbally.    2 lesion(s)  on hands   was/were treated with liquid nitrogen on a Q-tip in a usual fasion . Band-Aid was applied and antibiotic ointment was given for a later use.   Tolerated well. Complications none.   Postprocedure instructions :     Keep the wounds clean. You can wash them with liquid soap and water. Pat dry with gauze or a Kleenex tissue  Before applying antibiotic ointment and a Band-Aid.   You need to report immediately  if  any signs of infection develop.    Lab Results  Component Value Date   WBC 6.9 08/16/2021   HGB 12.3 08/16/2021   HCT 37.9 08/16/2021   PLT 386 08/16/2021   GLUCOSE 119 (H) 08/28/2021   CHOL 134 08/10/2020   TRIG 263 (H) 08/10/2020   HDL 37 (L) 08/10/2020   LDLDIRECT 125.0 06/05/2015   LDLCALC 44 08/10/2020   ALT 14 08/28/2021   AST 22 08/28/2021   NA 137 08/28/2021   K 4.1 08/28/2021   CL 101 08/28/2021   CREATININE 1.13 08/28/2021   BUN 18 08/28/2021   CO2 28 08/28/2021   TSH 40.96 (H) 10/01/2021   INR 1.1 08/16/2021   HGBA1C 6.4 06/18/2021    MM 3D SCREEN BREAST UNI LEFT  Result Date: 08/30/2021 CLINICAL DATA:  Screening. EXAM: DIGITAL SCREENING UNILATERAL LEFT MAMMOGRAM WITH CAD AND TOMOSYNTHESIS TECHNIQUE: Left screening digital craniocaudal and mediolateral oblique mammograms were  obtained. Left screening digital breast tomosynthesis was performed. The images were evaluated with computer-aided detection. COMPARISON:  Previous exam(s). ACR Breast Density Category b: There are scattered areas of fibroglandular density. FINDINGS: There are no findings suspicious for malignancy. IMPRESSION: No mammographic evidence of malignancy. A result  letter of this screening mammogram will be mailed directly to the patient. RECOMMENDATION: Screening mammogram in one year. (Code:SM-B-01Y) BI-RADS CATEGORY  1: Negative. Electronically Signed   By: Lajean Manes M.D.   On: 08/30/2021 10:22   Assessment & Plan:   Problem List Items Addressed This Visit     Anxiety disorder    Better  Cont w/Xanax prn  Potential benefits of a long term benzodiazepines  use as well as potential risks  and complications were explained to the patient and were aknowledged. Take Xanax twice a day if needed Psychology ref is offered      Chronic fatigue    [name] [name] felt better on Synthroid w/Cytomel together (x 1 month) Will cont w/caution Monitor labs closely - FT3, FT4, TSH      HYPOTHYROIDISM, POSTSURGICAL   Situational depression    [name] [name] felt better on Synthroid w/Cytomel together (x 1 month) Will cont w/caution Monitor labs closely - FT3, FT4, TSH      Wart viral      No orders of the defined types were placed in this encounter.     Follow-up: Return in about 2 months (around 01/06/2022) for a follow-up visit.  Walker Kehr, MD

## 2021-11-06 NOTE — Patient Instructions (Addendum)
Although the American Thyroid Association guidelines do not recommend taking Cytomel and Synthroid together, many people who are under the supervision of an internist/endocrinologist do take this combination treatment, prefer it, and have good results on it. Never take this combination without your doctor's advice because it may result in excessive levels of thyroid hormones which can be dangerous.  Cytomel contains a thyroid hormone called liothyronine (also called LT3) and Synthroid contains a thyroid hormone called levothyroxine (also called LT4). In our bodies, our thyroid gland primarily makes T4 which is then converted by tissues into the active hormone T3. T4 lasts a lot longer in the body than T3 (half-life of 5 to 7 days for T4 versus approximately 1 day for T3), although T3 is 4 times more active than T4. The reason the American Thyroid Association prefers LT4 (Synthroid) is because it can be administered orally, effectively relieves the symptoms of hypothyroidism in most patients, and its long half-life allows once-daily dosing. Clinical trials have not shown any benefit in adding LT3 (Cytomel) to LT4 (Synthroid) which is why the American Thyroid Association discourages it.  International guidelines are reluctant to recommend it either, but some suggest it may be worth considering in certain circumstances with the [place] Thyroid Association stating "combination therapy should be considered solely as an experimental treatment overseen by accredited internists/endocrinologists and discontinued if no improvement is seen after three months". The [place] Thyroid Association says, "combination therapy is generally not recommended but a trial may be considered to improve adherence to treatment or patient well-being", and outlines a protocol for combination therapy.  If combination therapy is trialed, it is important that regular laboratory monitoring of both T4 and T3 levels are done and dosing is based  on the residual thyroid function (RTF). Well-designed, randomized clinical trials are needed to resolve the controversy over combing Cytomel and Synthroid   GLYTONE exfoliating body lotion (free acid value 17.5) -- $48 on [employer]

## 2021-11-06 NOTE — Assessment & Plan Note (Signed)
Better  Cont w/Xanax prn  Potential benefits of a long term benzodiazepines  use as well as potential risks  and complications were explained to the patient and were aknowledged. Take Xanax twice a day if needed Psychology ref is offered

## 2021-11-06 NOTE — Telephone Encounter (Signed)
Pt states will not pharmacy will not refill her rosuvastatin (CRESTOR) 20 MG tablet, even though she was auth for 1 refill.  Please contact pharmacy about refill. [id number]

## 2021-11-09 ENCOUNTER — Ambulatory Visit: Payer: Medicare Other | Admitting: Adult Health

## 2021-11-09 ENCOUNTER — Ambulatory Visit: Payer: Medicare Other

## 2021-11-09 ENCOUNTER — Other Ambulatory Visit: Payer: Medicare Other

## 2021-11-09 ENCOUNTER — Other Ambulatory Visit: Payer: Self-pay

## 2021-11-09 DIAGNOSIS — C50412 Malignant neoplasm of upper-outer quadrant of left female breast: Secondary | ICD-10-CM

## 2021-11-12 ENCOUNTER — Telehealth: Payer: Self-pay

## 2021-11-12 ENCOUNTER — Inpatient Hospital Stay (HOSPITAL_BASED_OUTPATIENT_CLINIC_OR_DEPARTMENT_OTHER): Payer: Medicare Other | Admitting: Adult Health

## 2021-11-12 ENCOUNTER — Inpatient Hospital Stay: Payer: Medicare Other

## 2021-11-12 ENCOUNTER — Inpatient Hospital Stay: Payer: Medicare Other | Attending: Adult Health

## 2021-11-12 ENCOUNTER — Encounter: Payer: Self-pay | Admitting: Adult Health

## 2021-11-12 ENCOUNTER — Telehealth: Payer: Self-pay | Admitting: Internal Medicine

## 2021-11-12 VITALS — BP 124/86 | HR 125 | Temp 97.4°F | Resp 18 | Ht 63.0 in | Wt 138.2 lb

## 2021-11-12 DIAGNOSIS — Z79811 Long term (current) use of aromatase inhibitors: Secondary | ICD-10-CM | POA: Insufficient documentation

## 2021-11-12 DIAGNOSIS — Z801 Family history of malignant neoplasm of trachea, bronchus and lung: Secondary | ICD-10-CM | POA: Insufficient documentation

## 2021-11-12 DIAGNOSIS — Z85850 Personal history of malignant neoplasm of thyroid: Secondary | ICD-10-CM | POA: Diagnosis not present

## 2021-11-12 DIAGNOSIS — R000 Tachycardia, unspecified: Secondary | ICD-10-CM | POA: Insufficient documentation

## 2021-11-12 DIAGNOSIS — Z9221 Personal history of antineoplastic chemotherapy: Secondary | ICD-10-CM | POA: Insufficient documentation

## 2021-11-12 DIAGNOSIS — M810 Age-related osteoporosis without current pathological fracture: Secondary | ICD-10-CM | POA: Diagnosis not present

## 2021-11-12 DIAGNOSIS — Z170 Estrogen receptor positive status [ER+]: Secondary | ICD-10-CM

## 2021-11-12 DIAGNOSIS — C50412 Malignant neoplasm of upper-outer quadrant of left female breast: Secondary | ICD-10-CM

## 2021-11-12 DIAGNOSIS — Z800 Family history of malignant neoplasm of digestive organs: Secondary | ICD-10-CM | POA: Insufficient documentation

## 2021-11-12 DIAGNOSIS — Z923 Personal history of irradiation: Secondary | ICD-10-CM | POA: Insufficient documentation

## 2021-11-12 DIAGNOSIS — Z8051 Family history of malignant neoplasm of kidney: Secondary | ICD-10-CM | POA: Diagnosis not present

## 2021-11-12 DIAGNOSIS — Z853 Personal history of malignant neoplasm of breast: Secondary | ICD-10-CM | POA: Diagnosis not present

## 2021-11-12 DIAGNOSIS — Z803 Family history of malignant neoplasm of breast: Secondary | ICD-10-CM | POA: Insufficient documentation

## 2021-11-12 DIAGNOSIS — Z806 Family history of leukemia: Secondary | ICD-10-CM | POA: Insufficient documentation

## 2021-11-12 DIAGNOSIS — M818 Other osteoporosis without current pathological fracture: Secondary | ICD-10-CM

## 2021-11-12 DIAGNOSIS — Z807 Family history of other malignant neoplasms of lymphoid, hematopoietic and related tissues: Secondary | ICD-10-CM | POA: Diagnosis not present

## 2021-11-12 DIAGNOSIS — I10 Essential (primary) hypertension: Secondary | ICD-10-CM | POA: Insufficient documentation

## 2021-11-12 DIAGNOSIS — E890 Postprocedural hypothyroidism: Secondary | ICD-10-CM

## 2021-11-12 DIAGNOSIS — R002 Palpitations: Secondary | ICD-10-CM

## 2021-11-12 LAB — CMP (CANCER CENTER ONLY)
ALT: 17 U/L (ref 0–44)
AST: 26 U/L (ref 15–41)
Albumin: 3.9 g/dL (ref 3.5–5.0)
Alkaline Phosphatase: 83 U/L (ref 38–126)
Anion gap: 9 (ref 5–15)
BUN: 18 mg/dL (ref 8–23)
CO2: 26 mmol/L (ref 22–32)
Calcium: 10.3 mg/dL (ref 8.9–10.3)
Chloride: 106 mmol/L (ref 98–111)
Creatinine: 1.4 mg/dL — ABNORMAL HIGH (ref 0.44–1.00)
GFR, Estimated: 38 mL/min — ABNORMAL LOW (ref >60)
Glucose, Bld: 167 mg/dL — ABNORMAL HIGH (ref 70–99)
Potassium: 3.9 mmol/L (ref 3.5–5.1)
Sodium: 141 mmol/L (ref 135–145)
Total Bilirubin: 0.5 mg/dL (ref 0.3–1.2)
Total Protein: 6.7 g/dL (ref 6.5–8.1)

## 2021-11-12 LAB — CBC WITH DIFFERENTIAL (CANCER CENTER ONLY)
Abs Immature Granulocytes: 0.01 10*3/uL (ref 0.00–0.07)
Basophils Absolute: 0 10*3/uL (ref 0.0–0.1)
Basophils Relative: 1 %
Eosinophils Absolute: 0.2 10*3/uL (ref 0.0–0.5)
Eosinophils Relative: 4 %
HCT: 35.8 % — ABNORMAL LOW (ref 36.0–46.0)
Hemoglobin: 11.7 g/dL — ABNORMAL LOW (ref 12.0–15.0)
Immature Granulocytes: 0 %
Lymphocytes Relative: 29 %
Lymphs Abs: 1.5 10*3/uL (ref 0.7–4.0)
MCH: 28.8 pg (ref 26.0–34.0)
MCHC: 32.7 g/dL (ref 30.0–36.0)
MCV: 88.2 fL (ref 80.0–100.0)
Monocytes Absolute: 0.4 10*3/uL (ref 0.1–1.0)
Monocytes Relative: 7 %
Neutro Abs: 3.2 10*3/uL (ref 1.7–7.7)
Neutrophils Relative %: 59 %
Platelet Count: 327 10*3/uL (ref 150–400)
RBC: 4.06 MIL/uL (ref 3.87–5.11)
RDW: 14.4 % (ref 11.5–15.5)
WBC Count: 5.3 10*3/uL (ref 4.0–10.5)
nRBC: 0 % (ref 0.0–0.2)

## 2021-11-12 NOTE — Assessment & Plan Note (Addendum)
[name] [name] is an 81 year old woman with history of stage IIb recurrent left breast cancer estrogen progesterone positive HER2 negative diagnosed in 16 August 2016.  She is status post left lumpectomy, adjuvant chemotherapy, adjuvant radiation therapy, and antiestrogen therapy with anastrozole which began in February 2019.  [name] [name] has no clinical or radiographic sign of breast cancer recurrence.  She will continue on anastrozole daily.  Her most recent bone density test results continue to show osteoporosis with a T score -3.3.  We recommend that she continue on Prolia given every 6 months.  Due to her elevated heart rate today we will wait a couple weeks and have her come back to receive the injection.  We have asked that her primary care provider get her in to evaluate the tachycardia further as her thyroid medicine has recently changed.  Discussed her tachycardia in detail and I reviewed with her reasons to seek care immediately and go to the emergency room.  [name] [name] will return in approximately 2 weeks for labs and her injection and then we will see her back in 6 months for labs, follow-up, Prolia.

## 2021-11-12 NOTE — Telephone Encounter (Signed)
Cancer center states they saw patient today as part of their survivorship program and patient's heart rate was elevated. Patient told them that she has ben experiencing this over the last couple of weeks. They request that we give patient a  call at [phone].

## 2021-11-12 NOTE — Telephone Encounter (Signed)
Called to help schedule appt with PCP this week due to elevated heart rate. Heart rate 125 at office visit with Wilber Bihari, NP. Her heart rate has been elevated over the past 1-2 weeks per [facility].  Spoke with office staff and they call [facility] and schedule appt with PCP.

## 2021-11-12 NOTE — Progress Notes (Signed)
[facility] Follow up:    [name], [name] Lacks, MD [place] [address]   DIAGNOSIS:  Cancer Staging  Malignant neoplasm of upper-outer quadrant of left breast in female, estrogen receptor positive ([place]) Staging form: Breast, AJCC 8th Edition - Pathologic: Stage IB (pT1b, pN0, cM0, G3, ER-, PR-, HER2-) - Unsigned Histologic grading system: 3 grade system   SUMMARY OF ONCOLOGIC HISTORY: (1) status post right lumpectomy and sentinel lymph node biopsy in September 2002 for multifocal breast carcinoma, triple negative.  Treated with CMF followed by radiation.    (2) Local recurrence in April 2004, status post right modified radical mastectomy with TRAM reconstruction for what proved to be a T1c N0, stage IA triple negative breast carcinoma.  Treated adjuvantly with paclitaxel and doxorubicin x4 in 2004.  Off treatment since August 2004 with no evidence of recurrence   (3) history of papillary thyroid cancer s/p thyroidectomy June 2007, s/p radioactive iodine July 2007   (4) status post left breast upper outer quadrant biopsy 08/21/2016 for a clinical T1b N0, stage IB invasive ductal carcinoma, grade 3, weakly estrogen receptor positive, progesterone receptor and HER-2 negative, with an MIB-1 of 80%.   (5) left lumpectomy and sentinel lymph node sampling 09/11/2016 found a pT1b pN0, stage IB invasive ductal carcinoma, grade 3, with negative margins.   (6) adjuvant chemotherapy consisting of carboplatin and gemcitabine given days 1 and 8 of each 21 day cycle 4 cycles, started 09/18/2016 (Neupogen on days 2 and 3, and Onpro on day 8 for chemotherapy induced neutropenia), last dose December 11, 2016   (7) adjuvant radiation completed 02/14/2017 Site/dose:   Left breast/ 2.5 Gy x 60f                  Boost/ 2.5Gy x 359f  (8) genetics testing 12/13/2016 through the multi-Cancer panel (83 genes) @ [facility] -found a monoallelic mutation in [place]);  NTHL1 c.268C>T (p.Gln90*) (a) there were no deleterious mutations in ALK, APC, ATM, AXIN2, BAP1, BARD1, BLM, BMPR1A, BRCA1, BRCA2, BRIP1, CASR, CDC73, CDH1, CDK4, CDKN1B, CDKN1C, CDKN2A, CEBPA, CHEK2, CTNNA1, DICER1, DIS3L2, EGFR, EPCAM, FH, FLCN, GATA2, GPC3, GREM1, HOXB13, HRAS, KIT, MAX, MEN1, MET, MITF, MLH1, MSH2, MSH3, MSH6, MUTYH, NBN, NF1, NF2, NTHL1, PALB2, PDGFRA, PHOX2B, PMS2, POLD1, POLE, POT1, PRKAR1A, PTCH1, PTEN, RAD50, RAD51C, RAD51D, RB1, RECQL4, RET, RUNX1, SDHA, SDHAF2, SDHB, SDHC, SDHD, SMAD4, SMARCA4, SMARCB1, SMARCE1, STK11, SUFU, TERC, TERT, TMEM127, TP53, TSC1, TSC2, VHL, WRN, WT1 (b) while homozygous mutations in the NPH L1 gene are associated with autosomal recessive polyposis, there is no evidence to suggest that a single mutation in this gene increases cancer risk.   (9) started anastrozole 03/14/2017 (a) bone density on 05/06/2017 showed a T score of -3.4--osteoporosis (b) started denosumab/Prolia June 2019, to be repeated every 6 months (C) repeat bone density on August 01, 2021 shows a T score of -3.3 in the left femoral neck.  CURRENT THERAPY: Anastrozole, Prolia.  INTERVAL HISTORY: [name] MYHRE81.o. female returns for follow-up of her left breast cancer.  She underwent left breast screening mammogram on August 30, 2021 that showed no mammographic evidence of malignancy.  She also underwent bone density testing on August 01, 2021 which showed a T score of -3.3 in the left femoral neck.  She receives Prolia every 6 months and is also taking anastrozole daily.  She is doing moderately well however she has had a cough and hoarseness for the past 4  to 5 weeks that she is concerned about.  She also notes that her sister recently passed and she has lost a lot of weight.  She denies any difficulty swallowing but does note that her appetite is off slightly.  She also says that she has recently underwent thyroid medication change.  Her heart rate today is 125 she does get some  pain in her central back when she is walking around and active.   Patient Active Problem List   Diagnosis Date Noted   Palpitations 02/19/2021   DOE (dyspnea on exertion) 11/16/2020   Chronic sinusitis 11/16/2020   Upper respiratory infection 09/28/2020   Retinal artery occlusion, branch, right 08/09/2020   History of TIA (transient ischemic attack) 08/09/2020   Hyperparathyroidism ([place]) 06/13/2020   Skin lesion 03/21/2020   Cerebrovascular disease 03/21/2020   Pruritus 03/14/2020   Ataxia 07/20/2019   Seroma of breast 07/20/2019   Cerumen impaction 03/23/2019   Wrist pain 03/23/2019   Right ear pain 06/27/2018   Coronary artery disease 01/20/2018   Breast cyst, left 09/17/2017   Aortic atherosclerosis ([place]) 07/28/2017   Osteoporosis 05/07/2017   Genetic testing 12/05/2016   Family history of breast cancer    Port catheter in place 10/23/2016   Encounter for antineoplastic chemotherapy 10/23/2016   Malignant neoplasm of upper-outer quadrant of left breast in female, estrogen receptor positive ([place]) 07/29/2016   Well adult exam 06/05/2015   Neoplasm of uncertain behavior of skin 06/05/2015   Adenopathy, cervical 06/05/2015   Sinus tachycardia 06/07/2014   Wart viral 10/18/2011   Hyperglycemia 11/06/2010   Actinic keratoses 05/25/2010   Essential hypertension 02/23/2010   SHOULDER PAIN 11/22/2009   Chronic fatigue 11/08/2009   RLQ PAIN 11/08/2009   VERTIGO 07/11/2009   ECZEMA 10/19/2008   CT, CHEST, ABNORMAL 05/30/2008   STYE 02/17/2008   B12 deficiency 12/16/2007   Vitamin D deficiency 12/16/2007   HYPOTHYROIDISM, POSTSURGICAL 12/01/2007   Acute maxillary sinusitis 11/11/2007   ALLERGIC RESPIRATORY DISEASE, EXTRINSIC 11/11/2007   THYROID CANCER 08/19/2007   Dyslipidemia 08/19/2007   Anxiety disorder 08/19/2007   Situational depression 08/19/2007   Allergic rhinitis 08/19/2007    is allergic to pneumococcal vaccines, sulfa antibiotics, sulfacetamide  sodium-sulfur, sulfamethoxazole-trimethoprim, tape, and tegaderm ag mesh [silver].  MEDICAL HISTORY: Past Medical History:  Diagnosis Date   Allergic rhinitis    Anxiety    Breast cancer ([facility]) hx 2004   recurrent 2006 DR Magrinat   Family history of breast cancer    Genetic testing 12/05/2016   Multi-Cancer panel (83 genes) @ [facility] - Monoallelic mutation in [place] (carrier)   HTN (hypertension)    Hyperlipidemia    Hypothyroidism    Personal history of chemotherapy 2002   Personal history of radiation therapy 2002   Psoriasis    S/P thyroidectomy 07/2005   2 cm largest diameter (1 other tiny focus)/ i-131 rx 99 mci 08/2005   Thyroid cancer ([place])    Papillary Stage 1 - Dr Loanne Drilling   Vitamin B12 deficiency 2009   Vitamin D deficiency 2009    SURGICAL HISTORY: Past Surgical History:  Procedure Laterality Date   BREAST LUMPECTOMY     BREAST LUMPECTOMY WITH RADIOACTIVE SEED AND SENTINEL LYMPH NODE BIOPSY Left 09/11/2016   Procedure: LEFT BREAST LUMPECTOMY WITH RADIOACTIVE SEED AND LEFT AXILLARY SENTINEL LYMPH NODE BIOPSY WITH BLUE DYE INJECTION;  Surgeon: [name], MD;  Location: [facility];  Service: General;  Laterality: Left;   IR FLUORO GUIDE PORT INSERTION RIGHT  09/13/2016   IR REMOVAL TUN ACCESS W/ PORT W/O FL MOD SED  12/30/2016   IR US GUIDE VASC ACCESS RIGHT  09/13/2016   MASTECTOMY Right    Right   PORTACATH PLACEMENT N/A 09/11/2016   Procedure: ATTEMPTED INSERTION PORT-A-CATH WITH ULTRA SOUND GUIDANCE;  Surgeon: [name], MD;  Location: [facility];  Service: General;  Laterality: N/A;   REDUCTION MAMMAPLASTY Left 2005   THYROIDECTOMY  2007    SOCIAL HISTORY: Social History   Socioeconomic History   Marital status: Married    Spouse name: Not on file   Number of children: 2   Years of education: Not on file   Highest education level: Not on file  Occupational History   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, 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  Tobacco Use   Smoking status: Never   Smokeless tobacco: Never  Substance and Sexual Activity   Alcohol use: No   Drug use: No   Sexual activity: Not Currently  Other Topics Concern   Not on file  Social History Narrative   GI - Dr Earlean Shawl   Depression - Dr Toy Care   GYN - Dr Marianna Payment      Social Determinants of Health   Financial Resource Strain: Low Risk  (06/18/2021)   Overall Financial Resource Strain (CARDIA)    Difficulty of Paying Living Expenses: Not hard at all  Food Insecurity: No Food Insecurity (06/18/2021)   Hunger Vital Sign    Worried About Running Out of Food in the Last Year: Never true    [place] in the Last Year: Never true  Transportation Needs: No Transportation Needs (06/18/2021)   PRAPARE - Hydrologist (Medical): No    Lack of Transportation (Non-Medical): No  Physical Activity: Sufficiently Active (06/18/2021)   Exercise Vital Sign    Days of Exercise per Week: 5 days    Minutes of Exercise per Session: 30 min  Stress: No Stress Concern Present (06/18/2021)   [place]    Feeling of Stress : Not at all  Social Connections: Moderately Integrated (06/18/2021)   Social Connection and Isolation Panel [NHANES]    Frequency of Communication with Friends and Family: Three times a week    Frequency of Social Gatherings with Friends and Family: Three times a week    Attends Religious Services: More than 4 times per year    Active Member of Clubs or Organizations: No    Attends Archivist Meetings: Never    Marital Status: Married  Human resources officer Violence: Not At Risk (06/18/2021)   Humiliation, Afraid, Rape, and Kick questionnaire    Fear of Current or Ex-Partner: No    Emotionally Abused: No    Physically Abused: No    Sexually Abused: No    FAMILY HISTORY: Family History  Problem Relation Age of Onset   Stroke  Mother    Allergies Mother    Asthma Mother    Clotting disorder Mother    Heart disease Father 59       MI   Allergies Sister    Asthma Sister    Breast cancer Sister 20       Lymphoma 16; currently 62   Lymphoma Sister    Hyperparathyroidism Sister    Asthma Brother    Leukemia Brother        dx 48s  Allergies Daughter    Asthma Daughter    Allergies Sister    Breast cancer Sister 44       currently 61   Kidney cancer Paternal Aunt        kidney ca; deceased 88   Liver cancer Paternal Uncle        unk. primary ("liver")   Throat cancer Maternal Grandmother        deceased 8   Lung cancer Maternal Grandfather        deceased 67   Pancreatic cancer Paternal Grandfather        deceased 78   Cancer Paternal Aunt        "abdominal"; deceased 77    Review of Systems  Constitutional:  Negative for appetite change, chills, fatigue, fever and unexpected weight change.  HENT:   Negative for hearing loss, lump/mass and trouble swallowing.   Eyes:  Negative for eye problems and icterus.  Respiratory:  Negative for chest tightness, cough and shortness of breath.   Cardiovascular:  Negative for chest pain, leg swelling and palpitations.  Gastrointestinal:  Negative for abdominal distention, abdominal pain, constipation, diarrhea, nausea and vomiting.  Endocrine: Negative for hot flashes.  Genitourinary:  Negative for difficulty urinating.   Musculoskeletal:  Positive for back pain. Negative for arthralgias.  Skin:  Negative for itching and rash.  Neurological:  Negative for dizziness, extremity weakness, headaches and numbness.  Hematological:  Negative for adenopathy. Does not bruise/bleed easily.  Psychiatric/Behavioral:  Negative for depression. The patient is not nervous/anxious.       PHYSICAL EXAMINATION  ECOG PERFORMANCE STATUS: 1 - Symptomatic but completely ambulatory  Vitals:   11/12/21 1332  BP: 124/86  Pulse: (!) 125  Resp: 18  Temp: (!) 97.4 F (36.3 C)   SpO2: 100%    Physical Exam Constitutional:      General: She is not in acute distress.    Appearance: Normal appearance. She is not toxic-appearing.  HENT:     Head: Normocephalic and atraumatic.  Eyes:     General: No scleral icterus. Cardiovascular:     Rate and Rhythm: Normal rate and regular rhythm.     Pulses: Normal pulses.     Heart sounds: Normal heart sounds.  Pulmonary:     Effort: Pulmonary effort is normal.     Breath sounds: Normal breath sounds.  Chest:     Comments: Declined breast exam today Abdominal:     General: Abdomen is flat. Bowel sounds are normal. There is no distension.     Palpations: Abdomen is soft.     Tenderness: There is no abdominal tenderness.  Musculoskeletal:        General: No swelling.     Cervical back: Neck supple.  Lymphadenopathy:     Cervical: No cervical adenopathy.  Skin:    General: Skin is warm and dry.     Findings: No rash.  Neurological:     General: No focal deficit present.     Mental Status: She is alert.  Psychiatric:        Mood and Affect: Mood normal.        Behavior: Behavior normal.     LABORATORY DATA:  CBC    Component Value Date/Time   WBC 5.3 11/12/2021 1325   WBC 6.9 08/16/2021 2045   RBC 4.06 11/12/2021 1325   HGB 11.7 (L) 11/12/2021 1325   HGB 12.2 08/10/2021 1948   HGB 9.6 (L) 12/11/2016 1017   HCT 35.8 (  L) 11/12/2021 1325   HCT 37.8 08/10/2021 1948   HCT 28.5 (L) 12/11/2016 1017   PLT 327 11/12/2021 1325   PLT 395 08/10/2021 1948   MCV 88.2 11/12/2021 1325   MCV 91 08/10/2021 1948   MCV 99.5 12/11/2016 1017   MCH 28.8 11/12/2021 1325   MCHC 32.7 11/12/2021 1325   RDW 14.4 11/12/2021 1325   RDW 13.1 08/10/2021 1948   RDW 19.8 (H) 12/11/2016 1017   LYMPHSABS 1.5 11/12/2021 1325   LYMPHSABS 1.5 12/11/2016 1017   MONOABS 0.4 11/12/2021 1325   MONOABS 0.5 12/11/2016 1017   EOSABS 0.2 11/12/2021 1325   EOSABS 0.0 12/11/2016 1017   BASOSABS 0.0 11/12/2021 1325   BASOSABS 0.0  12/11/2016 1017    CMP     Component Value Date/Time   NA 141 11/12/2021 1325   NA 140 08/10/2021 1948   NA 139 12/11/2016 1017   K 3.9 11/12/2021 1325   K 3.7 12/11/2016 1017   CL 106 11/12/2021 1325   CL 104 10/09/2011 1403   CO2 26 11/12/2021 1325   CO2 22 12/11/2016 1017   GLUCOSE 167 (H) 11/12/2021 1325   GLUCOSE 122 12/11/2016 1017   GLUCOSE 113 (H) 10/09/2011 1403   BUN 18 11/12/2021 1325   BUN 21 08/10/2021 1948   BUN 16.9 12/11/2016 1017   CREATININE 1.40 (H) 11/12/2021 1325   CREATININE 1.74 (H) 10/20/2019 1634   CREATININE 1.2 (H) 12/11/2016 1017   CALCIUM 10.3 11/12/2021 1325   CALCIUM 9.0 12/11/2016 1017   PROT 6.7 11/12/2021 1325   PROT 6.9 08/10/2021 1948   PROT 7.4 12/11/2016 1017   ALBUMIN 3.9 11/12/2021 1325   ALBUMIN 4.1 08/10/2021 1948   ALBUMIN 4.0 12/11/2016 1017   AST 26 11/12/2021 1325   AST 65 (H) 12/11/2016 1017   ALT 17 11/12/2021 1325   ALT 51 12/11/2016 1017   ALKPHOS 83 11/12/2021 1325   ALKPHOS 139 12/11/2016 1017   BILITOT 0.5 11/12/2021 1325   BILITOT 0.42 12/11/2016 1017   GFRNONAA 38 (L) 11/12/2021 1325   GFRNONAA 27 (L) 10/20/2019 1634   GFRAA 32 (L) 10/20/2019 1634       ASSESSMENT and THERAPY PLAN:   Malignant neoplasm of upper-outer quadrant of left breast in female, estrogen receptor positive ([facility]) Menna is an 81 year old woman with history of stage IIb recurrent left breast cancer estrogen progesterone positive HER2 negative diagnosed in 16 August 2016.  She is status post left lumpectomy, adjuvant chemotherapy, adjuvant radiation therapy, and antiestrogen therapy with anastrozole which began in February 2019.  Briyonna has no clinical or radiographic sign of breast cancer recurrence.  She will continue on anastrozole daily.  Her most recent bone density test results continue to show osteoporosis with a T score -3.3.  We recommend that she continue on Prolia given every 6 months.  Due to her elevated heart rate today we will  wait a couple weeks and have her come back to receive the injection.  We have asked that her primary care provider get her in to evaluate the tachycardia further as her thyroid medicine has recently changed.  Discussed her tachycardia in detail and I reviewed with her reasons to seek care immediately and go to the emergency room.  Maui will return in approximately 2 weeks for labs and her injection and then we will see her back in 6 months for labs, follow-up, Prolia.   All questions were answered. The patient knows to call the clinic with any  problems, questions or concerns. We can certainly see the patient much sooner if necessary.  Total encounter time:30 minutes*in face-to-face visit time, chart review, lab review, care coordination, order entry, and documentation of the encounter time.    Wilber Bihari, NP 11/12/21 3:53 PM Medical Oncology and Hematology [facility] [place], [place] [address] Tel. [phone]    Fax. [phone]  *Total Encounter Time as defined by the Centers for Medicare and Medicaid Services includes, in addition to the face-to-face time of a patient visit (documented in the note above) non-face-to-face time: obtaining and reviewing outside history, ordering and reviewing medications, tests or procedures, care coordination (communications with other health care professionals or caregivers) and documentation in the medical record.

## 2021-11-13 NOTE — Telephone Encounter (Signed)
Called pt no answer LMOM RTC.../lmb 

## 2021-11-13 NOTE — Telephone Encounter (Signed)
Called pt back she states cancer center was concern that HR been running high. Pt states her HR been running high since her TIA. She's been off balance. She hurst across her shoulders. She want to know does the effexor or her thyroid meds can make her HR beat fast. Pt states she does not have cardiologist may need to be referred.Marland KitchenJohny Chess

## 2021-11-13 NOTE — Telephone Encounter (Signed)
Patient returned your call please call back.

## 2021-11-14 ENCOUNTER — Other Ambulatory Visit: Payer: Medicare Other

## 2021-11-15 MED ORDER — LIOTHYRONINE SODIUM 25 MCG PO TABS: 12.5000 ug | ORAL_TABLET | Freq: Every day | ORAL | 5 refills | Status: DC

## 2021-11-15 MED ORDER — METOPROLOL SUCCINATE ER 25 MG PO TB24: 12.5000 mg | ORAL_TABLET | Freq: Every evening | ORAL | 5 refills | Status: DC

## 2021-11-15 NOTE — Telephone Encounter (Signed)
Notified pt w/ MD response. Made appt for 12/04/21.Marland Kitchenlmb

## 2021-11-15 NOTE — Telephone Encounter (Signed)
Decrease Cytomel to half tablet a day. Start Toprol-XL 25 mg 1/2 tablet a day at night.  The prescription was emailed to the pharmacy. Please see me in 1-2 weeks with labs. Thanks

## 2021-11-19 ENCOUNTER — Other Ambulatory Visit: Payer: Medicare Other

## 2021-11-23 ENCOUNTER — Other Ambulatory Visit: Payer: Self-pay

## 2021-11-23 DIAGNOSIS — Z170 Estrogen receptor positive status [ER+]: Secondary | ICD-10-CM

## 2021-11-26 ENCOUNTER — Inpatient Hospital Stay: Payer: Medicare Other

## 2021-11-27 ENCOUNTER — Other Ambulatory Visit: Payer: Medicare Other

## 2021-11-28 ENCOUNTER — Other Ambulatory Visit: Payer: Medicare Other

## 2021-11-29 ENCOUNTER — Other Ambulatory Visit (INDEPENDENT_AMBULATORY_CARE_PROVIDER_SITE_OTHER): Payer: Medicare Other

## 2021-11-29 DIAGNOSIS — E890 Postprocedural hypothyroidism: Secondary | ICD-10-CM

## 2021-11-29 DIAGNOSIS — R002 Palpitations: Secondary | ICD-10-CM

## 2021-11-29 LAB — COMPREHENSIVE METABOLIC PANEL
ALT: 15 U/L (ref 0–35)
AST: 22 U/L (ref 0–37)
Albumin: 3.8 g/dL (ref 3.5–5.2)
Alkaline Phosphatase: 70 U/L (ref 39–117)
BUN: 21 mg/dL (ref 6–23)
CO2: 25 mEq/L (ref 19–32)
Calcium: 10.2 mg/dL (ref 8.4–10.5)
Chloride: 103 mEq/L (ref 96–112)
Creatinine, Ser: 1.52 mg/dL — ABNORMAL HIGH (ref 0.40–1.20)
GFR: 31.71 mL/min — ABNORMAL LOW (ref >60.00)
Glucose, Bld: 129 mg/dL — ABNORMAL HIGH (ref 70–99)
Potassium: 4.2 mEq/L (ref 3.5–5.1)
Sodium: 138 mEq/L (ref 135–145)
Total Bilirubin: 0.4 mg/dL (ref 0.2–1.2)
Total Protein: 6.7 g/dL (ref 6.0–8.3)

## 2021-11-29 LAB — T4, FREE: Free T4: 0.79 ng/dL (ref 0.60–1.60)

## 2021-11-29 LAB — T3, FREE: T3, Free: 3.1 pg/mL (ref 2.3–4.2)

## 2021-11-29 LAB — TSH: TSH: 0.22 u[IU]/mL — ABNORMAL LOW (ref 0.35–5.50)

## 2021-12-04 ENCOUNTER — Encounter: Payer: Self-pay | Admitting: Internal Medicine

## 2021-12-04 ENCOUNTER — Other Ambulatory Visit: Payer: Self-pay | Admitting: Internal Medicine

## 2021-12-04 ENCOUNTER — Ambulatory Visit (INDEPENDENT_AMBULATORY_CARE_PROVIDER_SITE_OTHER): Payer: Medicare Other | Admitting: Internal Medicine

## 2021-12-04 VITALS — BP 120/72 | HR 72 | Temp 97.6°F | Ht 63.0 in | Wt 138.2 lb

## 2021-12-04 DIAGNOSIS — I10 Essential (primary) hypertension: Secondary | ICD-10-CM | POA: Diagnosis not present

## 2021-12-04 DIAGNOSIS — R5382 Chronic fatigue, unspecified: Secondary | ICD-10-CM | POA: Diagnosis not present

## 2021-12-04 DIAGNOSIS — E890 Postprocedural hypothyroidism: Secondary | ICD-10-CM

## 2021-12-04 DIAGNOSIS — E538 Deficiency of other specified B group vitamins: Secondary | ICD-10-CM | POA: Diagnosis not present

## 2021-12-04 DIAGNOSIS — R269 Unspecified abnormalities of gait and mobility: Secondary | ICD-10-CM | POA: Insufficient documentation

## 2021-12-04 DIAGNOSIS — M539 Dorsopathy, unspecified: Secondary | ICD-10-CM | POA: Insufficient documentation

## 2021-12-04 DIAGNOSIS — F4321 Adjustment disorder with depressed mood: Secondary | ICD-10-CM

## 2021-12-04 DIAGNOSIS — M545 Low back pain, unspecified: Secondary | ICD-10-CM | POA: Insufficient documentation

## 2021-12-04 DIAGNOSIS — G8929 Other chronic pain: Secondary | ICD-10-CM

## 2021-12-04 MED ORDER — ROSUVASTATIN CALCIUM 20 MG PO TABS: 20.0000 mg | ORAL_TABLET | Freq: Every day | ORAL | 1 refills | Status: DC

## 2021-12-04 MED ORDER — VENLAFAXINE HCL ER 150 MG PO CP24: 150.0000 mg | ORAL_CAPSULE | Freq: Every day | ORAL | 1 refills | Status: DC

## 2021-12-04 NOTE — Assessment & Plan Note (Signed)
Check abd [place] UA

## 2021-12-04 NOTE — Assessment & Plan Note (Signed)
[place] felt better on Synthroid w/Cytomel together (x 1 month) Will cont w/caution Monitor labs closely - FT3, FT4, TSH

## 2021-12-04 NOTE — Assessment & Plan Note (Signed)
PT offered

## 2021-12-04 NOTE — Progress Notes (Signed)
Subjective:  Patient ID: [name] [name], female    DOB: 1940-06-10  Age: 81 y.o. MRN: [id number]  CC: Follow-up (2 WEEK F/U - Discuss thyroid)   HPI [name] [name] presents for LBP, anxiety, depression, CFS  Outpatient Medications Prior to Visit  Medication Sig Dispense Refill   ALPRAZolam (XANAX) 1 MG tablet TAKE 1/2 TO 1 TABLET BY MOUTH 2 TIMES DAILY AS NEEDED FOR ANXIETY 60 tablet 2   anastrozole (ARIMIDEX) 1 MG tablet TAKE 1 TABLET BY MOUTH EVERY DAY 90 tablet 4   cholecalciferol (VITAMIN D) 1000 units tablet Take 2 tablets (2,000 Units total) by mouth daily. 100 tablet 3   clopidogrel (PLAVIX) 75 MG tablet TAKE 1 TABLET (75 MG TOTAL) BY MOUTH DAILY FOR 21 DAYS. 90 tablet 3   guaiFENesin (MUCINEX) 600 MG 12 hr tablet Take 1 tablet (600 mg total) by mouth 2 (two) times daily. 60 tablet 1   levothyroxine (SYNTHROID) 112 MCG tablet TAKE 1 TABLET (112 MCG TOTAL) BY MOUTH DAILY. 90 tablet 3   liothyronine (CYTOMEL) 25 MCG tablet Take 0.5 tablets (12.5 mcg total) by mouth daily. 30 tablet 5   metoprolol succinate (TOPROL-XL) 25 MG 24 hr tablet Take 0.5 tablets (12.5 mg total) by mouth at bedtime. 30 tablet 5   telmisartan (MICARDIS) 20 MG tablet Take 1 tablet (20 mg total) by mouth daily. 90 tablet 3   vitamin B-12 (CYANOCOBALAMIN) 1000 MCG tablet Take 1,000 mcg by mouth daily.     rosuvastatin (CRESTOR) 20 MG tablet TAKE 1 TABLET BY MOUTH EVERY DAY 90 tablet 1   venlafaxine XR (EFFEXOR XR) 150 MG 24 hr capsule Take 1 capsule (150 mg total) by mouth daily with breakfast. 90 capsule 1   No facility-administered medications prior to visit.    ROS: Review of Systems  Constitutional:  Positive for fatigue. Negative for activity change, appetite change, chills and unexpected weight change.  HENT:  Negative for congestion, mouth sores and sinus pressure.   Eyes:  Negative for visual disturbance.  Respiratory:  Negative for cough and chest tightness.   Gastrointestinal:  Negative for  abdominal pain and nausea.  Genitourinary:  Negative for difficulty urinating, frequency and vaginal pain.  Musculoskeletal:  Positive for arthralgias and back pain. Negative for gait problem.  Skin:  Negative for pallor and rash.  Neurological:  Negative for dizziness, tremors, weakness, numbness and headaches.  Psychiatric/Behavioral:  Positive for sleep disturbance. Negative for confusion and suicidal ideas. The patient is nervous/anxious.     Objective:  BP 120/72 (BP Location: Left Arm)   Pulse 72   Temp 97.6 F (36.4 C) (Oral)   Ht '5\' 3"'$  (1.6 m)   Wt 138 lb 3.2 oz (62.7 kg)   SpO2 97%   BMI 24.48 kg/m   BP Readings from Last 3 Encounters:  12/04/21 120/72  11/12/21 124/86  11/06/21 120/82    Wt Readings from Last 3 Encounters:  12/04/21 138 lb 3.2 oz (62.7 kg)  11/12/21 138 lb 3.2 oz (62.7 kg)  11/06/21 139 lb 9.6 oz (63.3 kg)    Physical Exam Constitutional:      General: She is not in acute distress.    Appearance: Normal appearance. She is well-developed.  HENT:     Head: Normocephalic.     Right Ear: External ear normal.     Left Ear: External ear normal.     Nose: Nose normal.  Eyes:     General:  Right eye: No discharge.        Left eye: No discharge.     Conjunctiva/sclera: Conjunctivae normal.     Pupils: Pupils are equal, round, and reactive to light.  Neck:     Thyroid: No thyromegaly.     Vascular: No JVD.     Trachea: No tracheal deviation.  Cardiovascular:     Rate and Rhythm: Normal rate and regular rhythm.     Heart sounds: Normal heart sounds.  Pulmonary:     Effort: No respiratory distress.     Breath sounds: No stridor. No wheezing.  Abdominal:     General: Bowel sounds are normal. There is no distension.     Palpations: Abdomen is soft. There is no mass.     Tenderness: There is no abdominal tenderness. There is no guarding or rebound.  Musculoskeletal:        General: No tenderness.     Cervical back: Normal range of motion  and neck supple. No rigidity.  Lymphadenopathy:     Cervical: No cervical adenopathy.  Skin:    Findings: No erythema or rash.  Neurological:     Mental Status: She is oriented to person, place, and time.     Cranial Nerves: No cranial nerve deficit.     Motor: No abnormal muscle tone.     Coordination: Coordination abnormal.     Gait: Gait abnormal.     Deep Tendon Reflexes: Reflexes normal.  Psychiatric:        Behavior: Behavior normal.        Thought Content: Thought content normal.        Judgment: Judgment normal.   ataxic  Lab Results  Component Value Date   WBC 5.3 11/12/2021   HGB 11.7 (L) 11/12/2021   HCT 35.8 (L) 11/12/2021   PLT 327 11/12/2021   GLUCOSE 129 (H) 11/29/2021   CHOL 134 08/10/2020   TRIG 263 (H) 08/10/2020   HDL 37 (L) 08/10/2020   LDLDIRECT 125.0 06/05/2015   LDLCALC 44 08/10/2020   ALT 15 11/29/2021   AST 22 11/29/2021   NA 138 11/29/2021   K 4.2 11/29/2021   CL 103 11/29/2021   CREATININE 1.52 (H) 11/29/2021   BUN 21 11/29/2021   CO2 25 11/29/2021   TSH 0.22 (L) 11/29/2021   INR 1.1 08/16/2021   HGBA1C 6.4 06/18/2021    MM 3D SCREEN BREAST UNI LEFT  Result Date: 08/30/2021 CLINICAL DATA:  Screening. EXAM: DIGITAL SCREENING UNILATERAL LEFT MAMMOGRAM WITH CAD AND TOMOSYNTHESIS TECHNIQUE: Left screening digital craniocaudal and mediolateral oblique mammograms were obtained. Left screening digital breast tomosynthesis was performed. The images were evaluated with computer-aided detection. COMPARISON:  Previous exam(s). ACR Breast Density Category b: There are scattered areas of fibroglandular density. FINDINGS: There are no findings suspicious for malignancy. IMPRESSION: No mammographic evidence of malignancy. A result letter of this screening mammogram will be mailed directly to the patient. RECOMMENDATION: Screening mammogram in one year. (Code:SM-B-01Y) BI-RADS CATEGORY  1: Negative. Electronically Signed   By: Lajean Manes M.D.   On:  08/30/2021 10:22   Assessment & Plan:   Problem List Items Addressed This Visit     B12 deficiency    Cont w/Vit B12      Relevant Orders   CBC with Differential/Platelet   Chronic fatigue    [name] [name] felt better on Synthroid w/Cytomel together (x 1 month) Will cont w/caution Monitor labs closely - FT3, FT4, TSH  Continue Levoxyl and Cytomel. Labs  are OK      Relevant Orders   US Abdomen Complete   T3, free   T4, free   TSH   Urinalysis   Comprehensive metabolic panel   CBC with Differential/Platelet   Essential hypertension    Telmisartan at low dose      Relevant Medications   rosuvastatin (CRESTOR) 20 MG tablet   Gait disorder    PT offered      Relevant Orders   T3, free   T4, free   TSH   Urinalysis   Comprehensive metabolic panel   CBC with Differential/Platelet   HYPOTHYROIDISM, POSTSURGICAL - Primary     Continue Levoxyl and Cytomel. Labs are OK      Relevant Orders   T3, free   T4, free   TSH   Urinalysis   Comprehensive metabolic panel   CBC with Differential/Platelet   Low back pain    Check abd [place] UA      Low back problem    Check abd [place] test      Situational depression    Mauri felt better on Synthroid w/Cytomel together (x 1 month) Will cont w/caution Monitor labs closely - FT3, FT4, TSH      Relevant Medications   venlafaxine XR (EFFEXOR XR) 150 MG 24 hr capsule   Other Relevant Orders   US Abdomen Complete      Meds ordered this encounter  Medications   venlafaxine XR (EFFEXOR XR) 150 MG 24 hr capsule    Sig: Take 1 capsule (150 mg total) by mouth daily with breakfast.    Dispense:  90 capsule    Refill:  1   rosuvastatin (CRESTOR) 20 MG tablet    Sig: Take 1 tablet (20 mg total) by mouth daily.    Dispense:  90 tablet    Refill:  1      Follow-up: Return in about 2 months (around 02/03/2022) for a follow-up visit.  Walker Kehr, MD

## 2021-12-04 NOTE — Assessment & Plan Note (Addendum)
[name] [name] felt better on Synthroid w/Cytomel together (x 1 month) Will cont w/caution Monitor labs closely - FT3, FT4, TSH  Continue Levoxyl and Cytomel. Labs are OK

## 2021-12-04 NOTE — Assessment & Plan Note (Signed)
Telmisartan at low dose

## 2021-12-04 NOTE — Assessment & Plan Note (Signed)
Cont w/Vit B12 

## 2021-12-04 NOTE — Assessment & Plan Note (Signed)
Check abd [place] test

## 2021-12-04 NOTE — Assessment & Plan Note (Signed)
Continue Levoxyl and Cytomel. Labs are OK 

## 2021-12-06 NOTE — Telephone Encounter (Signed)
Patient is calling in and only has 1 and half pills left.

## 2021-12-11 ENCOUNTER — Other Ambulatory Visit: Payer: Medicare Other

## 2021-12-12 ENCOUNTER — Ambulatory Visit
Admission: RE | Admit: 2021-12-12 | Discharge: 2021-12-12 | Disposition: A | Discharge status: Home / Self Care | Payer: Medicare Other | Source: Ambulatory Visit | Attending: Internal Medicine | Admitting: Internal Medicine

## 2021-12-12 DIAGNOSIS — K760 Fatty (change of) liver, not elsewhere classified: Secondary | ICD-10-CM | POA: Diagnosis not present

## 2021-12-12 DIAGNOSIS — M545 Low back pain, unspecified: Secondary | ICD-10-CM | POA: Diagnosis not present

## 2021-12-12 DIAGNOSIS — N261 Atrophy of kidney (terminal): Secondary | ICD-10-CM | POA: Diagnosis not present

## 2021-12-12 DIAGNOSIS — N281 Cyst of kidney, acquired: Secondary | ICD-10-CM | POA: Diagnosis not present

## 2022-01-01 ENCOUNTER — Ambulatory Visit: Payer: Medicare Other | Admitting: Internal Medicine

## 2022-01-01 ENCOUNTER — Encounter: Payer: Self-pay | Admitting: Internal Medicine

## 2022-01-01 ENCOUNTER — Ambulatory Visit (INDEPENDENT_AMBULATORY_CARE_PROVIDER_SITE_OTHER): Payer: Medicare Other | Admitting: Internal Medicine

## 2022-01-01 VITALS — BP 118/82 | HR 91 | Temp 97.9°F | Ht 63.0 in | Wt 135.0 lb

## 2022-01-01 DIAGNOSIS — G8929 Other chronic pain: Secondary | ICD-10-CM

## 2022-01-01 DIAGNOSIS — E213 Hyperparathyroidism, unspecified: Secondary | ICD-10-CM | POA: Diagnosis not present

## 2022-01-01 DIAGNOSIS — M545 Low back pain, unspecified: Secondary | ICD-10-CM

## 2022-01-01 DIAGNOSIS — E538 Deficiency of other specified B group vitamins: Secondary | ICD-10-CM

## 2022-01-01 DIAGNOSIS — E890 Postprocedural hypothyroidism: Secondary | ICD-10-CM

## 2022-01-01 MED ORDER — MECLIZINE HCL 12.5 MG PO TABS: 12.5000 mg | ORAL_TABLET | Freq: Three times a day (TID) | ORAL | 1 refills | Status: DC | PRN

## 2022-01-01 NOTE — Assessment & Plan Note (Signed)
FT3 and FT4 are nl  Continue Levoxyl and Cytomel.

## 2022-01-01 NOTE — Assessment & Plan Note (Signed)
Calcium was normal

## 2022-01-01 NOTE — Assessment & Plan Note (Signed)
MSK LBP Sorts cream Abd [place] was OK PT ref

## 2022-01-01 NOTE — Progress Notes (Signed)
Subjective:  Patient ID: [name] [name], female    DOB: 03/14/1940  Age: 81 y.o. MRN: [id number]  CC: Follow-up (3 month f/u- Pt c/o vertigo & being nausea today)   HPI Coy T Mccranie presents for CRI, fatty liver, anxiety  Outpatient Medications Prior to Visit  Medication Sig Dispense Refill   ALPRAZolam (XANAX) 1 MG tablet TAKE 1/2 TO 1 TABLET BY MOUTH 2 TIMES DAILY AS NEEDED FOR ANXIETY 60 tablet 3   anastrozole (ARIMIDEX) 1 MG tablet TAKE 1 TABLET BY MOUTH EVERY DAY 90 tablet 4   cholecalciferol (VITAMIN D) 1000 units tablet Take 2 tablets (2,000 Units total) by mouth daily. 100 tablet 3   clopidogrel (PLAVIX) 75 MG tablet TAKE 1 TABLET (75 MG TOTAL) BY MOUTH DAILY FOR 21 DAYS. 90 tablet 3   guaiFENesin (MUCINEX) 600 MG 12 hr tablet Take 1 tablet (600 mg total) by mouth 2 (two) times daily. 60 tablet 1   levothyroxine (SYNTHROID) 112 MCG tablet TAKE 1 TABLET (112 MCG TOTAL) BY MOUTH DAILY. 90 tablet 3   liothyronine (CYTOMEL) 25 MCG tablet Take 0.5 tablets (12.5 mcg total) by mouth daily. 30 tablet 5   metoprolol succinate (TOPROL-XL) 25 MG 24 hr tablet Take 0.5 tablets (12.5 mg total) by mouth at bedtime. 30 tablet 5   rosuvastatin (CRESTOR) 20 MG tablet Take 1 tablet (20 mg total) by mouth daily. 90 tablet 1   telmisartan (MICARDIS) 20 MG tablet Take 1 tablet (20 mg total) by mouth daily. 90 tablet 3   venlafaxine XR (EFFEXOR XR) 150 MG 24 hr capsule Take 1 capsule (150 mg total) by mouth daily with breakfast. 90 capsule 1   vitamin B-12 (CYANOCOBALAMIN) 1000 MCG tablet Take 1,000 mcg by mouth daily.     No facility-administered medications prior to visit.    ROS: Review of Systems  Constitutional:  Negative for activity change, appetite change, chills, fatigue and unexpected weight change.  HENT:  Negative for congestion, mouth sores and sinus pressure.   Eyes:  Negative for visual disturbance.  Respiratory:  Negative for cough and chest tightness.   Gastrointestinal:   Negative for abdominal pain and nausea.  Genitourinary:  Negative for difficulty urinating, frequency and vaginal pain.  Musculoskeletal:  Positive for arthralgias and back pain. Negative for gait problem.  Skin:  Negative for pallor and rash.  Neurological:  Negative for dizziness, tremors, weakness, numbness and headaches.  Psychiatric/Behavioral:  Negative for confusion and sleep disturbance. The patient is nervous/anxious.     Objective:  BP 118/82 (BP Location: Left Arm)   Pulse 91   Temp 97.9 F (36.6 C) (Oral)   Ht '5\' 3"'$  (1.6 m)   Wt 135 lb (61.2 kg)   SpO2 99%   BMI 23.91 kg/m   BP Readings from Last 3 Encounters:  01/01/22 118/82  12/04/21 120/72  11/12/21 124/86    Wt Readings from Last 3 Encounters:  01/01/22 135 lb (61.2 kg)  12/04/21 138 lb 3.2 oz (62.7 kg)  11/12/21 138 lb 3.2 oz (62.7 kg)    Physical Exam Constitutional:      General: She is not in acute distress.    Appearance: She is well-developed.  HENT:     Head: Normocephalic.     Right Ear: External ear normal.     Left Ear: External ear normal.     Nose: Nose normal.  Eyes:     General:        Right eye: No discharge.  Left eye: No discharge.     Conjunctiva/sclera: Conjunctivae normal.     Pupils: Pupils are equal, round, and reactive to light.  Neck:     Thyroid: No thyromegaly.     Vascular: No JVD.     Trachea: No tracheal deviation.  Cardiovascular:     Rate and Rhythm: Normal rate and regular rhythm.     Heart sounds: Normal heart sounds.  Pulmonary:     Effort: No respiratory distress.     Breath sounds: No stridor. No wheezing.  Abdominal:     General: Bowel sounds are normal. There is no distension.     Palpations: Abdomen is soft. There is no mass.     Tenderness: There is no abdominal tenderness. There is no guarding or rebound.  Musculoskeletal:        General: No tenderness.     Cervical back: Normal range of motion and neck supple. No rigidity.  Lymphadenopathy:      Cervical: No cervical adenopathy.  Skin:    Findings: No erythema or rash.  Neurological:     Cranial Nerves: No cranial nerve deficit.     Motor: No abnormal muscle tone.     Coordination: Coordination normal.     Deep Tendon Reflexes: Reflexes normal.  Psychiatric:        Behavior: Behavior normal.        Thought Content: Thought content normal.        Judgment: Judgment normal.     Lab Results  Component Value Date   WBC 5.3 11/12/2021   HGB 11.7 (L) 11/12/2021   HCT 35.8 (L) 11/12/2021   PLT 327 11/12/2021   GLUCOSE 129 (H) 11/29/2021   CHOL 134 08/10/2020   TRIG 263 (H) 08/10/2020   HDL 37 (L) 08/10/2020   LDLDIRECT 125.0 06/05/2015   LDLCALC 44 08/10/2020   ALT 15 11/29/2021   AST 22 11/29/2021   NA 138 11/29/2021   K 4.2 11/29/2021   CL 103 11/29/2021   CREATININE 1.52 (H) 11/29/2021   BUN 21 11/29/2021   CO2 25 11/29/2021   TSH 0.22 (L) 11/29/2021   INR 1.1 08/16/2021   HGBA1C 6.4 06/18/2021    US Abdomen Complete  Result Date: 12/14/2021 CLINICAL DATA:  LBP.  CRI. EXAM: ABDOMEN ULTRASOUND COMPLETE COMPARISON:  CT abdomen pelvis 10/10/2017 FINDINGS: Gallbladder: No gallstones or wall thickening visualized. No sonographic Murphy sign noted by sonographer. Common bile duct: Diameter: 0.4 cm Liver: No focal lesion identified. Diffuse increased echogenicity. Portal vein is patent on color Doppler imaging with normal direction of blood flow towards the liver. IVC: No abnormality visualized. Pancreas: Not well visualized due to overlying bowel gas. Spleen: Size and appearance within normal limits. Right Kidney: Length: 8.9. Diffuse increased echogenicity and mild cortical thinning. No hydronephrosis visualized. A 4.0 cm anechoic exophytic benign cortical cyst is noted at the upper pole. No follow-up imaging indicated. Left Kidney: Length: 11.6. Diffuse increased echogenicity and mild cortical thinning. No hydronephrosis visualized. A 5.5 cm anechoic exophytic benign  cortical cyst is noted at the lower pole. No follow-up imaging is indicated. Abdominal aorta: No aneurysm visualized. Other findings: None. IMPRESSION: 1. Hepatic steatosis. 2. Mildly atrophic and echogenic kidneys, consistent with medical renal disease. Bilateral benign renal cysts. 3. Remainder of the exam is normal. Electronically Signed   By: Ileana Roup M.D.   On: 12/14/2021 17:33    Assessment & Plan:   Problem List Items Addressed This Visit     HYPOTHYROIDISM,  POSTSURGICAL    FT3 and FT4 are nl  Continue Levoxyl and Cytomel.       B12 deficiency    On B12      Hyperparathyroidism ([place])    Calcium was normal      Low back pain - Primary    MSK LBP Sorts cream Abd [place] was OK PT ref      Relevant Orders   Ambulatory referral to Physical Therapy      Meds ordered this encounter  Medications   meclizine (ANTIVERT) 12.5 MG tablet    Sig: Take 1 tablet (12.5 mg total) by mouth 3 (three) times daily as needed for dizziness.    Dispense:  60 tablet    Refill:  1      Follow-up: Return in about 2 months (around 03/03/2022) for a follow-up visit.  Walker Kehr, MD

## 2022-01-01 NOTE — Assessment & Plan Note (Signed)
On B12 

## 2022-01-16 ENCOUNTER — Ambulatory Visit: Payer: Medicare Other | Admitting: Physical Therapy

## 2022-01-22 ENCOUNTER — Ambulatory Visit: Payer: Medicare Other

## 2022-01-28 ENCOUNTER — Telehealth: Payer: Self-pay | Admitting: Adult Health

## 2022-01-28 NOTE — Telephone Encounter (Signed)
Rescheduled appointment per room/resource. Left voicemail. 

## 2022-01-29 ENCOUNTER — Inpatient Hospital Stay: Payer: Medicare Other

## 2022-01-29 ENCOUNTER — Inpatient Hospital Stay: Payer: Medicare Other | Admitting: Adult Health

## 2022-01-29 ENCOUNTER — Inpatient Hospital Stay: Payer: Medicare Other | Attending: Adult Health | Admitting: Adult Health

## 2022-01-31 ENCOUNTER — Telehealth: Payer: Self-pay | Admitting: Internal Medicine

## 2022-01-31 MED ORDER — LEVOTHYROXINE SODIUM 112 MCG PO TABS: 112.0000 ug | ORAL_TABLET | Freq: Every day | ORAL | 3 refills | Status: DC

## 2022-01-31 NOTE — Telephone Encounter (Signed)
Sent refill to [facility],,,/[facility]

## 2022-01-31 NOTE — Telephone Encounter (Signed)
Caller & Relationship to patient:  self   Call back number:[phone]   Date of last office visit:  01/03/2022   Date of next office visit: 03/05/2022   Medication(s) to be refilled: Levothyroxine 112 mcg        Preferred Pharmacy: [facility] on [address], [place]

## 2022-02-13 ENCOUNTER — Ambulatory Visit: Payer: Medicare Other | Admitting: Physical Therapy

## 2022-02-14 ENCOUNTER — Inpatient Hospital Stay: Payer: Medicare Other

## 2022-02-14 ENCOUNTER — Inpatient Hospital Stay: Payer: Medicare Other | Admitting: Adult Health

## 2022-02-19 ENCOUNTER — Other Ambulatory Visit: Payer: Self-pay

## 2022-02-19 ENCOUNTER — Inpatient Hospital Stay: Payer: Medicare Other | Admitting: Adult Health

## 2022-02-19 ENCOUNTER — Inpatient Hospital Stay: Payer: Medicare Other | Attending: Adult Health

## 2022-02-19 ENCOUNTER — Inpatient Hospital Stay: Payer: Medicare Other

## 2022-02-19 DIAGNOSIS — M810 Age-related osteoporosis without current pathological fracture: Secondary | ICD-10-CM | POA: Insufficient documentation

## 2022-02-19 DIAGNOSIS — Z85850 Personal history of malignant neoplasm of thyroid: Secondary | ICD-10-CM | POA: Insufficient documentation

## 2022-02-19 DIAGNOSIS — Z806 Family history of leukemia: Secondary | ICD-10-CM | POA: Insufficient documentation

## 2022-02-19 DIAGNOSIS — Z803 Family history of malignant neoplasm of breast: Secondary | ICD-10-CM | POA: Insufficient documentation

## 2022-02-19 DIAGNOSIS — Z170 Estrogen receptor positive status [ER+]: Secondary | ICD-10-CM | POA: Insufficient documentation

## 2022-02-19 DIAGNOSIS — C50412 Malignant neoplasm of upper-outer quadrant of left female breast: Secondary | ICD-10-CM | POA: Insufficient documentation

## 2022-02-19 DIAGNOSIS — Z923 Personal history of irradiation: Secondary | ICD-10-CM | POA: Insufficient documentation

## 2022-02-19 DIAGNOSIS — I10 Essential (primary) hypertension: Secondary | ICD-10-CM | POA: Insufficient documentation

## 2022-02-19 DIAGNOSIS — Z800 Family history of malignant neoplasm of digestive organs: Secondary | ICD-10-CM | POA: Insufficient documentation

## 2022-02-19 DIAGNOSIS — Z79811 Long term (current) use of aromatase inhibitors: Secondary | ICD-10-CM | POA: Insufficient documentation

## 2022-02-19 DIAGNOSIS — Z9221 Personal history of antineoplastic chemotherapy: Secondary | ICD-10-CM | POA: Insufficient documentation

## 2022-02-19 DIAGNOSIS — Z807 Family history of other malignant neoplasms of lymphoid, hematopoietic and related tissues: Secondary | ICD-10-CM | POA: Insufficient documentation

## 2022-02-19 DIAGNOSIS — Z8051 Family history of malignant neoplasm of kidney: Secondary | ICD-10-CM | POA: Insufficient documentation

## 2022-02-19 DIAGNOSIS — Z801 Family history of malignant neoplasm of trachea, bronchus and lung: Secondary | ICD-10-CM | POA: Insufficient documentation

## 2022-02-19 DIAGNOSIS — Z9011 Acquired absence of right breast and nipple: Secondary | ICD-10-CM | POA: Insufficient documentation

## 2022-02-20 ENCOUNTER — Ambulatory Visit: Payer: Medicare Other | Admitting: Physical Therapy

## 2022-02-20 ENCOUNTER — Telehealth: Payer: Self-pay | Admitting: Internal Medicine

## 2022-02-20 NOTE — Telephone Encounter (Signed)
Anastrozole is cancer medication.Marland KitchenJohny Chess

## 2022-02-20 NOTE — Telephone Encounter (Signed)
Patient called and asked if Dr Alain Marion can fill anastrozole (ARIMIDEX) 1 MG tablet, patient has been out for a month. She said her Doctor that had been filling had retired

## 2022-02-22 ENCOUNTER — Telehealth: Payer: Self-pay | Admitting: Hematology and Oncology

## 2022-02-22 ENCOUNTER — Encounter: Payer: Self-pay | Admitting: Oncology

## 2022-02-22 NOTE — Telephone Encounter (Signed)
Recheduled appointments per 1/9 staff message. Patient is aware of all made appointments.

## 2022-02-24 MED ORDER — ANASTROZOLE 1 MG PO TABS: 1.0000 mg | ORAL_TABLET | Freq: Every day | ORAL | 1 refills | Status: DC

## 2022-02-24 NOTE — Telephone Encounter (Signed)
Okay.  Thanks.

## 2022-02-25 NOTE — Telephone Encounter (Signed)
Notified pt MD sent rx to [facility]../[facility] 

## 2022-02-26 ENCOUNTER — Inpatient Hospital Stay: Payer: Medicare Other

## 2022-02-26 ENCOUNTER — Inpatient Hospital Stay (HOSPITAL_BASED_OUTPATIENT_CLINIC_OR_DEPARTMENT_OTHER): Payer: Medicare Other | Admitting: Adult Health

## 2022-02-26 ENCOUNTER — Encounter: Payer: Self-pay | Admitting: Adult Health

## 2022-02-26 VITALS — BP 103/83 | HR 82 | Temp 97.8°F | Resp 16 | Ht 63.0 in | Wt 133.6 lb

## 2022-02-26 DIAGNOSIS — I10 Essential (primary) hypertension: Secondary | ICD-10-CM | POA: Diagnosis not present

## 2022-02-26 DIAGNOSIS — Z801 Family history of malignant neoplasm of trachea, bronchus and lung: Secondary | ICD-10-CM | POA: Diagnosis not present

## 2022-02-26 DIAGNOSIS — Z79811 Long term (current) use of aromatase inhibitors: Secondary | ICD-10-CM | POA: Diagnosis not present

## 2022-02-26 DIAGNOSIS — Z9011 Acquired absence of right breast and nipple: Secondary | ICD-10-CM | POA: Diagnosis not present

## 2022-02-26 DIAGNOSIS — C50412 Malignant neoplasm of upper-outer quadrant of left female breast: Secondary | ICD-10-CM | POA: Diagnosis not present

## 2022-02-26 DIAGNOSIS — R0602 Shortness of breath: Secondary | ICD-10-CM | POA: Diagnosis not present

## 2022-02-26 DIAGNOSIS — Z800 Family history of malignant neoplasm of digestive organs: Secondary | ICD-10-CM | POA: Diagnosis not present

## 2022-02-26 DIAGNOSIS — Z8051 Family history of malignant neoplasm of kidney: Secondary | ICD-10-CM | POA: Diagnosis not present

## 2022-02-26 DIAGNOSIS — Z170 Estrogen receptor positive status [ER+]: Secondary | ICD-10-CM | POA: Diagnosis not present

## 2022-02-26 DIAGNOSIS — Z807 Family history of other malignant neoplasms of lymphoid, hematopoietic and related tissues: Secondary | ICD-10-CM | POA: Diagnosis not present

## 2022-02-26 DIAGNOSIS — Z803 Family history of malignant neoplasm of breast: Secondary | ICD-10-CM | POA: Diagnosis not present

## 2022-02-26 DIAGNOSIS — Z806 Family history of leukemia: Secondary | ICD-10-CM | POA: Diagnosis not present

## 2022-02-26 DIAGNOSIS — R634 Abnormal weight loss: Secondary | ICD-10-CM

## 2022-02-26 DIAGNOSIS — M546 Pain in thoracic spine: Secondary | ICD-10-CM | POA: Diagnosis not present

## 2022-02-26 DIAGNOSIS — Z85850 Personal history of malignant neoplasm of thyroid: Secondary | ICD-10-CM | POA: Diagnosis not present

## 2022-02-26 DIAGNOSIS — M810 Age-related osteoporosis without current pathological fracture: Secondary | ICD-10-CM | POA: Diagnosis not present

## 2022-02-26 DIAGNOSIS — Z95828 Presence of other vascular implants and grafts: Secondary | ICD-10-CM

## 2022-02-26 DIAGNOSIS — Z923 Personal history of irradiation: Secondary | ICD-10-CM | POA: Diagnosis not present

## 2022-02-26 DIAGNOSIS — Z9221 Personal history of antineoplastic chemotherapy: Secondary | ICD-10-CM | POA: Diagnosis not present

## 2022-02-26 LAB — CBC WITH DIFFERENTIAL (CANCER CENTER ONLY)
Abs Immature Granulocytes: 0.03 10*3/uL (ref 0.00–0.07)
Basophils Absolute: 0 10*3/uL (ref 0.0–0.1)
Basophils Relative: 0 %
Eosinophils Absolute: 0.1 10*3/uL (ref 0.0–0.5)
Eosinophils Relative: 1 %
HCT: 38.7 % (ref 36.0–46.0)
Hemoglobin: 12.5 g/dL (ref 12.0–15.0)
Immature Granulocytes: 0 %
Lymphocytes Relative: 19 %
Lymphs Abs: 2 10*3/uL (ref 0.7–4.0)
MCH: 29.2 pg (ref 26.0–34.0)
MCHC: 32.3 g/dL (ref 30.0–36.0)
MCV: 90.4 fL (ref 80.0–100.0)
Monocytes Absolute: 0.8 10*3/uL (ref 0.1–1.0)
Monocytes Relative: 8 %
Neutro Abs: 7.6 10*3/uL (ref 1.7–7.7)
Neutrophils Relative %: 72 %
Platelet Count: 332 10*3/uL (ref 150–400)
RBC: 4.28 MIL/uL (ref 3.87–5.11)
RDW: 14.1 % (ref 11.5–15.5)
WBC Count: 10.6 10*3/uL — ABNORMAL HIGH (ref 4.0–10.5)
nRBC: 0 % (ref 0.0–0.2)

## 2022-02-26 LAB — CMP (CANCER CENTER ONLY)
ALT: 13 U/L (ref 0–44)
AST: 21 U/L (ref 15–41)
Albumin: 4.1 g/dL (ref 3.5–5.0)
Alkaline Phosphatase: 114 U/L (ref 38–126)
Anion gap: 12 (ref 5–15)
BUN: 22 mg/dL (ref 8–23)
CO2: 23 mmol/L (ref 22–32)
Calcium: 10.5 mg/dL — ABNORMAL HIGH (ref 8.9–10.3)
Chloride: 109 mmol/L (ref 98–111)
Creatinine: 1.45 mg/dL — ABNORMAL HIGH (ref 0.44–1.00)
GFR, Estimated: 36 mL/min — ABNORMAL LOW (ref >60)
Glucose, Bld: 92 mg/dL (ref 70–99)
Potassium: 4.4 mmol/L (ref 3.5–5.1)
Sodium: 144 mmol/L (ref 135–145)
Total Bilirubin: 0.6 mg/dL (ref 0.3–1.2)
Total Protein: 7.4 g/dL (ref 6.5–8.1)

## 2022-02-26 MED ORDER — ANASTROZOLE 1 MG PO TABS: 1.0000 mg | ORAL_TABLET | Freq: Every day | ORAL | 1 refills | Status: DC

## 2022-02-26 MED ORDER — DENOSUMAB 60 MG/ML ~~LOC~~ SOSY
60.0000 mg | PREFILLED_SYRINGE | Freq: Once | SUBCUTANEOUS | Status: AC
  Administered 2022-02-26: 60 mg via SUBCUTANEOUS
  Filled 2022-02-26: qty 1

## 2022-02-26 NOTE — Progress Notes (Signed)
[facility] Follow up:    Plotnikov, Evie Lacks, MD [place] [address]   DIAGNOSIS:  Cancer Staging  Malignant neoplasm of upper-outer quadrant of left breast in female, estrogen receptor positive ([place]) Staging form: Breast, AJCC 8th Edition - Pathologic: Stage IB (pT1b, pN0, cM0, G3, ER-, PR-, HER2-) - Unsigned Histologic grading system: 3 grade system   SUMMARY OF ONCOLOGIC HISTORY: (1) status post right lumpectomy and sentinel lymph node biopsy in September 2002 for multifocal breast carcinoma, triple negative.  Treated with CMF followed by radiation.    (2) Local recurrence in April 2004, status post right modified radical mastectomy with TRAM reconstruction for what proved to be a T1c N0, stage IA triple negative breast carcinoma.  Treated adjuvantly with paclitaxel and doxorubicin x4 in 2004.  Off treatment since August 2004 with no evidence of recurrence   (3) history of papillary thyroid cancer s/p thyroidectomy June 2007, s/p radioactive iodine July 2007   (4) status post left breast upper outer quadrant biopsy 08/21/2016 for a clinical T1b N0, stage IB invasive ductal carcinoma, grade 3, weakly estrogen receptor positive, progesterone receptor and HER-2 negative, with an MIB-1 of 80%.   (5) left lumpectomy and sentinel lymph node sampling 09/11/2016 found a pT1b pN0, stage IB invasive ductal carcinoma, grade 3, with negative margins.   (6) adjuvant chemotherapy consisting of carboplatin and gemcitabine given days 1 and 8 of each 21 day cycle 4 cycles, started 09/18/2016 (Neupogen on days 2 and 3, and Onpro on day 8 for chemotherapy induced neutropenia), last dose December 11, 2016   (7) adjuvant radiation completed 02/14/2017 Site/dose:   Left breast/ 2.5 Gy x 40f                  Boost/ 2.5Gy x 355f  (8) genetics testing 12/13/2016 through the multi-Cancer panel (83 genes) @ [facility] -found a monoallelic mutation in [place]);  NTHL1 c.268C>T (p.Gln90*) (a) there were no deleterious mutations in ALK, APC, ATM, AXIN2, BAP1, BARD1, BLM, BMPR1A, BRCA1, BRCA2, BRIP1, CASR, CDC73, CDH1, CDK4, CDKN1B, CDKN1C, CDKN2A, CEBPA, CHEK2, CTNNA1, DICER1, DIS3L2, EGFR, EPCAM, FH, FLCN, GATA2, GPC3, GREM1, HOXB13, HRAS, KIT, MAX, MEN1, MET, MITF, MLH1, MSH2, MSH3, MSH6, MUTYH, NBN, NF1, NF2, NTHL1, PALB2, PDGFRA, PHOX2B, PMS2, POLD1, POLE, POT1, PRKAR1A, PTCH1, PTEN, RAD50, RAD51C, RAD51D, RB1, RECQL4, RET, RUNX1, SDHA, SDHAF2, SDHB, SDHC, SDHD, SMAD4, SMARCA4, SMARCB1, SMARCE1, STK11, SUFU, TERC, TERT, TMEM127, TP53, TSC1, TSC2, VHL, WRN, WT1 (b) while homozygous mutations in the [place] gene are associated with autosomal recessive polyposis, there is no evidence to suggest that a single mutation in this gene increases cancer risk.   (9) started anastrozole 03/14/2017 (a) bone density on 05/06/2017 showed a T score of -3.4--osteoporosis (b) started denosumab/Prolia June 2019, to be repeated every 6 months (C) repeat bone density on August 01, 2021 shows a T score of -3.3 in the left femoral neck.  CURRENT THERAPY: Anastrozole  INTERVAL HISTORY: PeAFTEN LIPSEY24.o. female returns for follow-up of her history of breast cancer she continues on anastrozole daily.  Her most recent mammogram occurred on August 28, 2021 and demonstrated no mammographic evidence of malignancy breast density category B.  Over the past several months she has noticed increased shortness of breath with activity along with a 10 pound weight loss.  She denies any other significant issues today such as breast changes pain nausea vomiting or constipation.   Patient Active Problem List  Diagnosis Date Noted   Low back pain 12/04/2021   Low back problem 12/04/2021   Gait disorder 12/04/2021   Palpitations 02/19/2021   DOE (dyspnea on exertion) 11/16/2020   Chronic sinusitis 11/16/2020   Upper respiratory infection 09/28/2020   Retinal artery occlusion, branch,  right 08/09/2020   History of TIA (transient ischemic attack) 08/09/2020   Hyperparathyroidism ([place]) 06/13/2020   Skin lesion 03/21/2020   Cerebrovascular disease 03/21/2020   Pruritus 03/14/2020   Ataxia 07/20/2019   Seroma of breast 07/20/2019   Cerumen impaction 03/23/2019   Wrist pain 03/23/2019   Right ear pain 06/27/2018   Coronary artery disease 01/20/2018   Breast cyst, left 09/17/2017   Aortic atherosclerosis ([place]) 07/28/2017   Osteoporosis 05/07/2017   Genetic testing 12/05/2016   Family history of breast cancer    Port catheter in place 10/23/2016   Encounter for antineoplastic chemotherapy 10/23/2016   Malignant neoplasm of upper-outer quadrant of left breast in female, estrogen receptor positive ([place]) 07/29/2016   Well adult exam 06/05/2015   Neoplasm of uncertain behavior of skin 06/05/2015   Adenopathy, cervical 06/05/2015   Sinus tachycardia 06/07/2014   Wart viral 10/18/2011   Hyperglycemia 11/06/2010   Actinic keratoses 05/25/2010   Essential hypertension 02/23/2010   SHOULDER PAIN 11/22/2009   Chronic fatigue 11/08/2009   RLQ PAIN 11/08/2009   VERTIGO 07/11/2009   ECZEMA 10/19/2008   CT, CHEST, ABNORMAL 05/30/2008   STYE 02/17/2008   B12 deficiency 12/16/2007   Vitamin D deficiency 12/16/2007   HYPOTHYROIDISM, POSTSURGICAL 12/01/2007   Acute maxillary sinusitis 11/11/2007   ALLERGIC RESPIRATORY DISEASE, EXTRINSIC 11/11/2007   THYROID CANCER 08/19/2007   Dyslipidemia 08/19/2007   Anxiety disorder 08/19/2007   Situational depression 08/19/2007   Allergic rhinitis 08/19/2007    is allergic to pneumococcal vaccines, sulfa antibiotics, sulfacetamide sodium-sulfur, sulfamethoxazole-trimethoprim, tape, and tegaderm ag mesh [silver].  MEDICAL HISTORY: Past Medical History:  Diagnosis Date   Allergic rhinitis    Anxiety    Breast cancer ([facility]) hx 2004   recurrent 2006 DR Magrinat   Family history of breast cancer    Genetic testing 12/05/2016    Multi-Cancer panel (83 genes) @ [facility] - Monoallelic mutation in [place] (carrier)   HTN (hypertension)    Hyperlipidemia    Hypothyroidism    Personal history of chemotherapy 2002   Personal history of radiation therapy 2002   Psoriasis    S/P thyroidectomy 07/2005   2 cm largest diameter (1 other tiny focus)/ i-131 rx 99 mci 08/2005   Thyroid cancer ([place])    Papillary Stage 1 - Dr Loanne Drilling   Vitamin B12 deficiency 2009   Vitamin D deficiency 2009    SURGICAL HISTORY: Past Surgical History:  Procedure Laterality Date   BREAST LUMPECTOMY     BREAST LUMPECTOMY WITH RADIOACTIVE SEED AND SENTINEL LYMPH NODE BIOPSY Left 09/11/2016   Procedure: LEFT BREAST LUMPECTOMY WITH RADIOACTIVE SEED AND LEFT AXILLARY SENTINEL LYMPH NODE BIOPSY WITH BLUE DYE INJECTION;  Surgeon: [name], MD;  Location: [facility];  Service: General;  Laterality: Left;   IR FLUORO GUIDE PORT INSERTION RIGHT  09/13/2016   IR REMOVAL TUN ACCESS W/ PORT W/O FL MOD SED  12/30/2016   IR US GUIDE VASC ACCESS RIGHT  09/13/2016   MASTECTOMY Right    Right   PORTACATH PLACEMENT N/A 09/11/2016   Procedure: ATTEMPTED INSERTION PORT-A-CATH WITH ULTRA SOUND GUIDANCE;  Surgeon: [name], MD;  Location: [facility];  Service: General;  Laterality:  N/A;   REDUCTION MAMMAPLASTY Left 2005   THYROIDECTOMY  2007    SOCIAL HISTORY: Social History   Socioeconomic History   Marital status: Married    Spouse name: Not on file   Number of children: 2   Years of education: Not on file   Highest education level: Not on file  Occupational History   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, 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  Tobacco Use   Smoking status: Never   Smokeless tobacco: Never  Substance and Sexual Activity   Alcohol use: No   Drug use: No   Sexual activity: Not Currently  Other Topics Concern   Not on file  Social History Narrative   GI - Dr Earlean Shawl   Depression - Dr  Toy Care   GYN - Dr Marianna Payment      Social Determinants of Health   Financial Resource Strain: Low Risk  (06/18/2021)   Overall Financial Resource Strain (CARDIA)    Difficulty of Paying Living Expenses: Not hard at all  Food Insecurity: No Food Insecurity (06/18/2021)   Hunger Vital Sign    Worried About Running Out of Food in the Last Year: Never true    [place] in the Last Year: Never true  Transportation Needs: No Transportation Needs (06/18/2021)   PRAPARE - Hydrologist (Medical): No    Lack of Transportation (Non-Medical): No  Physical Activity: Sufficiently Active (06/18/2021)   Exercise Vital Sign    Days of Exercise per Week: 5 days    Minutes of Exercise per Session: 30 min  Stress: No Stress Concern Present (06/18/2021)   [place]    Feeling of Stress : Not at all  Social Connections: Moderately Integrated (06/18/2021)   Social Connection and Isolation Panel [NHANES]    Frequency of Communication with Friends and Family: Three times a week    Frequency of Social Gatherings with Friends and Family: Three times a week    Attends Religious Services: More than 4 times per year    Active Member of Clubs or Organizations: No    Attends Archivist Meetings: Never    Marital Status: Married  Human resources officer Violence: Not At Risk (06/18/2021)   Humiliation, Afraid, Rape, and Kick questionnaire    Fear of Current or Ex-Partner: No    Emotionally Abused: No    Physically Abused: No    Sexually Abused: No    FAMILY HISTORY: Family History  Problem Relation Age of Onset   Stroke Mother    Allergies Mother    Asthma Mother    Clotting disorder Mother    Heart disease Father 33       MI   Allergies Sister    Asthma Sister    Breast cancer Sister 28       Lymphoma 36; currently 54   Lymphoma Sister    Hyperparathyroidism Sister    Asthma Brother    Leukemia Brother         dx 30s   Allergies Daughter    Asthma Daughter    Allergies Sister    Breast cancer Sister 27       currently 26   Kidney cancer Paternal Aunt        kidney ca; deceased 74   Liver cancer Paternal Uncle        unk. primary ("liver")   Throat cancer  Maternal Grandmother        deceased 36   Lung cancer Maternal Grandfather        deceased 72   Pancreatic cancer Paternal Grandfather        deceased 10   Cancer Paternal Aunt        "abdominal"; deceased 53    Review of Systems  Constitutional:  Positive for unexpected weight change. Negative for appetite change, chills, fatigue and fever.  HENT:   Negative for hearing loss, lump/mass and trouble swallowing.   Eyes:  Negative for eye problems and icterus.  Respiratory:  Positive for shortness of breath. Negative for chest tightness and cough.   Cardiovascular:  Negative for chest pain, leg swelling and palpitations.  Gastrointestinal:  Negative for abdominal distention, abdominal pain, constipation, diarrhea, nausea and vomiting.  Endocrine: Negative for hot flashes.  Genitourinary:  Negative for difficulty urinating.   Musculoskeletal:  Negative for arthralgias.  Skin:  Negative for itching and rash.  Neurological:  Negative for dizziness, extremity weakness, headaches and numbness.  Hematological:  Negative for adenopathy. Does not bruise/bleed easily.  Psychiatric/Behavioral:  Negative for depression. The patient is not nervous/anxious.       PHYSICAL EXAMINATION  ECOG PERFORMANCE STATUS: 1 - Symptomatic but completely ambulatory  Vitals:   02/26/22 1427  BP: 103/83  Pulse: 82  Resp: 16  Temp: 97.8 F (36.6 C)  SpO2: 96%    Physical Exam Constitutional:      General: She is not in acute distress.    Appearance: Normal appearance. She is not toxic-appearing.  HENT:     Head: Normocephalic and atraumatic.  Eyes:     General: No scleral icterus. Cardiovascular:     Rate and Rhythm: Normal rate and regular  rhythm.     Pulses: Normal pulses.     Heart sounds: Normal heart sounds.  Pulmonary:     Effort: Pulmonary effort is normal.     Breath sounds: Normal breath sounds.  Abdominal:     General: Abdomen is flat. Bowel sounds are normal. There is no distension.     Palpations: Abdomen is soft.     Tenderness: There is no abdominal tenderness.  Musculoskeletal:        General: No swelling.     Cervical back: Neck supple.  Lymphadenopathy:     Cervical: No cervical adenopathy.  Skin:    General: Skin is warm and dry.     Findings: No rash.  Neurological:     General: No focal deficit present.     Mental Status: She is alert.  Psychiatric:        Mood and Affect: Mood normal.        Behavior: Behavior normal.     LABORATORY DATA:  CBC    Component Value Date/Time   WBC 10.6 (H) 02/26/2022 1413   WBC 6.9 08/16/2021 2045   RBC 4.28 02/26/2022 1413   HGB 12.5 02/26/2022 1413   HGB 12.2 08/10/2021 1948   HGB 9.6 (L) 12/11/2016 1017   HCT 38.7 02/26/2022 1413   HCT 37.8 08/10/2021 1948   HCT 28.5 (L) 12/11/2016 1017   PLT 332 02/26/2022 1413   PLT 395 08/10/2021 1948   MCV 90.4 02/26/2022 1413   MCV 91 08/10/2021 1948   MCV 99.5 12/11/2016 1017   MCH 29.2 02/26/2022 1413   MCHC 32.3 02/26/2022 1413   RDW 14.1 02/26/2022 1413   RDW 13.1 08/10/2021 1948   RDW 19.8 (H) 12/11/2016 1017  LYMPHSABS 2.0 02/26/2022 1413   LYMPHSABS 1.5 12/11/2016 1017   MONOABS 0.8 02/26/2022 1413   MONOABS 0.5 12/11/2016 1017   EOSABS 0.1 02/26/2022 1413   EOSABS 0.0 12/11/2016 1017   BASOSABS 0.0 02/26/2022 1413   BASOSABS 0.0 12/11/2016 1017     ASSESSMENT and THERAPY PLAN:   Malignant neoplasm of upper-outer quadrant of left breast in female, estrogen receptor positive ([facility]) Kileen is an 82 year old woman with history of recurrent breast cancer initially diagnosed in 2002 is triple negative with a local recurrence in 2004, then an additional recurrence in 2018 in the left breast  weakly estrogen positive status postlumpectomy followed by adjuvant chemotherapy, adjuvant radiation, and antiestrogen therapy with anastrozole which began in 2019.    Novalynn has had breast cancer 3 times.  She now has increased shortness of breath on exertion along with a 10 pound weight loss.  Giving this history I placed orders for CT chest abdomen pelvis to fully evaluate for metastatic breast cancer.  We will get this scheduled and will follow-up with her after her scans are completed.  She also has follow-up in July 2024 which we will keep on the schedule.   All questions were answered. The patient knows to call the clinic with any problems, questions or concerns. We can certainly see the patient much sooner if necessary.  Total encounter time:30 minutes*in face-to-face visit time, chart review, lab review, care coordination, order entry, and documentation of the encounter time.    Wilber Bihari, NP 03/04/22 9:24 AM Medical Oncology and Hematology [facility] [place], [place] [address] Tel. [phone]    Fax. [phone]  *Total Encounter Time as defined by the Centers for Medicare and Medicaid Services includes, in addition to the face-to-face time of a patient visit (documented in the note above) non-face-to-face time: obtaining and reviewing outside history, ordering and reviewing medications, tests or procedures, care coordination (communications with other health care professionals or caregivers) and documentation in the medical record.

## 2022-02-27 ENCOUNTER — Encounter: Payer: Self-pay | Admitting: Physical Therapy

## 2022-02-27 ENCOUNTER — Ambulatory Visit: Payer: Medicare Other | Attending: Internal Medicine | Admitting: Physical Therapy

## 2022-02-27 DIAGNOSIS — M5459 Other low back pain: Secondary | ICD-10-CM | POA: Insufficient documentation

## 2022-02-27 DIAGNOSIS — G8929 Other chronic pain: Secondary | ICD-10-CM | POA: Diagnosis not present

## 2022-02-27 DIAGNOSIS — M545 Low back pain, unspecified: Secondary | ICD-10-CM | POA: Diagnosis not present

## 2022-02-27 DIAGNOSIS — R2681 Unsteadiness on feet: Secondary | ICD-10-CM | POA: Insufficient documentation

## 2022-02-27 DIAGNOSIS — R2689 Other abnormalities of gait and mobility: Secondary | ICD-10-CM | POA: Insufficient documentation

## 2022-02-27 NOTE — Therapy (Addendum)
OUTPATIENT PHYSICAL THERAPY THORACOLUMBAR EVALUATION AND DISCHARGE   Patient Name: [name] [name] MRN: [id number] DOB:Feb 15, 1940, 82 y.o., female Today's Date: 02/27/2022  END OF SESSION:  PT End of Session - 02/27/22 1510     Visit Number 1    Number of Visits 16    Date for PT Re-Evaluation 04/24/22    Authorization Type BCBS    PT Start Time 1415    PT Stop Time 1509    PT Time Calculation (min) 54 min    Activity Tolerance Patient tolerated treatment well    Behavior During Therapy [facility] for tasks assessed/performed;Anxious             Past Medical History:  Diagnosis Date   Allergic rhinitis    Anxiety    Breast cancer ([name]) hx 2004   recurrent 2006 DR Magrinat   Family history of breast cancer    Genetic testing 12/05/2016   Multi-Cancer panel (83 genes) @ [facility] - Monoallelic mutation in [place] (carrier)   HTN (hypertension)    Hyperlipidemia    Hypothyroidism    Personal history of chemotherapy 2002   Personal history of radiation therapy 2002   Psoriasis    S/P thyroidectomy 07/2005   2 cm largest diameter (1 other tiny focus)/ i-131 rx 99 mci 08/2005   Thyroid cancer ([place])    Papillary Stage 1 - Dr Loanne Drilling   Vitamin B12 deficiency 2009   Vitamin D deficiency 2009   Past Surgical History:  Procedure Laterality Date   BREAST LUMPECTOMY     BREAST LUMPECTOMY WITH RADIOACTIVE SEED AND SENTINEL LYMPH NODE BIOPSY Left 09/11/2016   Procedure: LEFT BREAST LUMPECTOMY WITH RADIOACTIVE SEED AND LEFT AXILLARY SENTINEL LYMPH NODE BIOPSY WITH BLUE DYE INJECTION;  Surgeon: [name], MD;  Location: [place];  Service: General;  Laterality: Left;   IR FLUORO GUIDE PORT INSERTION RIGHT  09/13/2016   IR REMOVAL TUN ACCESS W/ PORT W/O FL MOD SED  12/30/2016   IR US GUIDE VASC ACCESS RIGHT  09/13/2016   MASTECTOMY Right    Right   PORTACATH PLACEMENT N/A 09/11/2016   Procedure: ATTEMPTED INSERTION PORT-A-CATH WITH ULTRA SOUND GUIDANCE;  Surgeon:  [name], MD;  Location: [place];  Service: General;  Laterality: N/A;   REDUCTION MAMMAPLASTY Left 2005   THYROIDECTOMY  2007   Patient Active Problem List   Diagnosis Date Noted   Low back pain 12/04/2021   Low back problem 12/04/2021   Gait disorder 12/04/2021   Palpitations 02/19/2021   DOE (dyspnea on exertion) 11/16/2020   Chronic sinusitis 11/16/2020   Upper respiratory infection 09/28/2020   Retinal artery occlusion, branch, right 08/09/2020   History of TIA (transient ischemic attack) 08/09/2020   Hyperparathyroidism ([place]) 06/13/2020   Skin lesion 03/21/2020   Cerebrovascular disease 03/21/2020   Pruritus 03/14/2020   Ataxia 07/20/2019   Seroma of breast 07/20/2019   Cerumen impaction 03/23/2019   Wrist pain 03/23/2019   Right ear pain 06/27/2018   Coronary artery disease 01/20/2018   Breast cyst, left 09/17/2017   Aortic atherosclerosis ([place]) 07/28/2017   Osteoporosis 05/07/2017   Genetic testing 12/05/2016   Family history of breast cancer    Port catheter in place 10/23/2016   Encounter for antineoplastic chemotherapy 10/23/2016   Malignant neoplasm of upper-outer quadrant of left breast in female, estrogen receptor positive ([place]) 07/29/2016   Well adult exam 06/05/2015   Neoplasm of uncertain behavior of skin 06/05/2015   Adenopathy, cervical  06/05/2015   Sinus tachycardia 06/07/2014   Wart viral 10/18/2011   Hyperglycemia 11/06/2010   Actinic keratoses 05/25/2010   Essential hypertension 02/23/2010   SHOULDER PAIN 11/22/2009   Chronic fatigue 11/08/2009   RLQ PAIN 11/08/2009   VERTIGO 07/11/2009   ECZEMA 10/19/2008   CT, CHEST, ABNORMAL 05/30/2008   STYE 02/17/2008   B12 deficiency 12/16/2007   Vitamin D deficiency 12/16/2007   HYPOTHYROIDISM, POSTSURGICAL 12/01/2007   Acute maxillary sinusitis 11/11/2007   ALLERGIC RESPIRATORY DISEASE, EXTRINSIC 11/11/2007   THYROID CANCER 08/19/2007   Dyslipidemia 08/19/2007   Anxiety  disorder 08/19/2007   Situational depression 08/19/2007   Allergic rhinitis 08/19/2007    PCP: Lew Dawes, MD   REFERRING PROVIDER: Lew Dawes, MD   REFERRING DIAG: Low back pain   Rationale for Evaluation and Treatment: Rehabilitation  THERAPY DIAG:  Unsteadiness on feet  Other low back pain  Other abnormalities of gait and mobility  ONSET DATE: 1 yr   SUBJECTIVE:                                                                                                                                                                                           SUBJECTIVE STATEMENT:  Patient was scheduled but previously had to cancel plans for much of her family and she was the primary caregiver.  He was referred to PT for low back pain likely related she states that most of her issues with balance and walking dizziness. She notices she leans to the L when sitting edge of bed.  She report Rt sided low back pain , mid and upper back pain.  Her pain does not limit her ability to do her normal ADLs, but depending on what she does she notices increased back pain after excessive activity.  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  She did have therapy for vertigo a couple of years ago.  She reports getting dizzy with positional changes especially rolling to the right.  She is unsteady on her feet and does not use a cane she has not fallen recently.  Has noticed a decline in her posture and is easily winded.  She wonders if she is taking too many medications and may be that be the reason she is dizzy.  PERTINENT HISTORY:  See above  Breast cancer, thyroid cancer, vertigo, arthritis. Scoliosis  Sister died Aug 10, 2021  PAIN:  Are you having pain? Yes: NPRS scale: min to none /10 Pain location: mid, upper back, rt low back. Pain description: sore  Aggravating factors: standing with arms handing down increased upper back pain , lower back pain increases  with  overactivity  Relieving factors: Rest  PRECAUTIONS: Fall and Other: Unsteady, dizziness  WEIGHT BEARING RESTRICTIONS: No  FALLS:  Has patient fallen in last 6 months? No  LIVING ENVIRONMENT: Lives with: lives with their spouse Lives in: House/apartment Stairs: No Has following equipment at home: None  OCCUPATION: not working   PLOF: Independent with basic ADLs, Independent with household mobility without device, and Independent with community mobility without device  PATIENT GOALS: I want to feel more on balance   NEXT MD VISIT:   OBJECTIVE:   DIAGNOSTIC FINDINGS:  None recent  PATIENT SURVEYS:  FOTO back pain 61%  SCREENING FOR RED FLAGS: Bowel or bladder incontinence: No Spinal tumors: No Cauda equina syndrome: No Compression fracture: No Abdominal aneurysm: No  COGNITION: Overall cognitive status: History of cognitive impairments - at baseline    Patient reports her memory is declining short-term recall and processing SENSATION: WFL  MUSCLE LENGTH: NT   POSTURE: rounded shoulders, forward head, increased thoracic kyphosis, posterior pelvic tilt, right pelvic obliquity, and flexed trunk   PALPATION: TTP mid back , post scapular   LUMBAR ROM:   AROM eval  Flexion WFL   Extension WFL guarded for balance   Right lateral flexion   Left lateral flexion   Right rotation WNL  Left rotation WNL    (Blank rows = not tested)  LOWER EXTREMITY ROM:    NT on eval  Passive  Right eval Left eval  Hip flexion    Hip extension    Hip abduction    Hip adduction    Hip internal rotation    Hip external rotation    Knee flexion    Knee extension    Ankle dorsiflexion    Ankle plantarflexion    Ankle inversion    Ankle eversion     (Blank rows = not tested)  LOWER EXTREMITY MMT:     WFL in sitting   MMT Right eval Left eval  Hip flexion    Hip extension    Hip abduction    Hip adduction    Hip internal rotation    Hip external rotation     Knee flexion    Knee extension    Ankle dorsiflexion    Ankle plantarflexion    Ankle inversion    Ankle eversion     (Blank rows = not tested)  LUMBAR SPECIAL TESTS:  NT   FUNCTIONAL TESTS:  5 times sit to stand: 28 sec, leans posteriorly, uses knees against the chair , min A  30 seconds chair stand test Timed up and go (TUG): NT on eval  Berg Balance Scale: 42/56   BERG BALANCE  Sitting to Standing: Numbers; 0-4: 4  4. Stands without using hands and stabilize independently  3. Stands independently using hands  2. Stands using hands after multiple trials  1. Min A to stand  0. Mod-Max A to stand Standing unsupported: Numbers; 0-4: 4  4. Stands safely for 2 minutes  3. Stands 2 minutes with supervision  2. Stands 30 seconds unsupported  1. Needs several tries to stand unsupported for 30 seconds  0. Unable to stand unsupported for 30 seconds Sitting unsupported: Numbers; 0-4: 4  4. Sits for 2 minutes independently  3. Sits for 2 minutes with supervision  2. Able to sit 30 seconds  1. Able to sit 10 seconds  0. Unable to sit for 10 seconds Standing to Sitting: Numbers; 0-4: 2 4. Sits safely with minimal use  of hands 3. Controls descent with hands 2. Uses back of legs against chair to control descent 1. Sits independently, but uncontrolled descent 0. Needs assistance Transfers: Numbers; 0-4: 3  4. Transfers safely with minor use of hands  3. Transfers safely definite use of hands  2. Transfers with verbal cueing/supervision  1. Needs 1 person assist  0. Needs 2 person assist  Standing with eyes closed: Numbers; 0-4: 4  4. Stands safely for 10 seconds  3. Stands 10 seconds with supervision   2. Able to stand for 3 seconds  1. Unable to keep eyes closed for 3 seconds, but is safe  0. Needs assist to keep from falling Standing with feet together: Numbers; 0-4: 3 4. Stands for 1 minute safely 3. Stands for 1 minute with supervision 2. Unable to hold for 30  seconds  1. Needs help to attain position but can hold for 15 seconds  0. Needs help to attain position and unable to hold for 15 seconds Reaching forward with outstretched arm: Numbers; 0-4: 3  4. Reaches forward 10 inches  3. Reaches forward 5 inches  2. Reaches forward 2 inches  1. Reaches forward with supervision  0. Loses balance/requires assistace Retrieving object from the floor: Numbers; 0-4: 3 4. Able to pick up easily and safely 3. Able to pick up with supervision 2. Unable to pick up, but reaches within 1-2 inches independently 1. Unable to pick up and needs supervision 0. Unable/needs assistance to keep from falling  Turning to look behind: Numbers; 0-4: 4  4. Looks behind from both sides and weight shifts well  3. Looks behind one side only, other side less weight shift  2. Turns sideways only, maintains balance  1. Needs supervision when turning  0. Needs assistance  Turning 360 degrees: Numbers; 0-4: 1  4. Able to turn in </=4 seconds  3. Able to turn on one side in </= 4 seconds   2. Able to turn slowly, but safely  1. Needs supervision or verbal cueing  0. Needs assistance Place alternate foot on stool: Numbers; 0-4: 2 4. Completes 8 steps in 20 seconds 3. Completes 8 steps in >20 seconds 2. 4 steps without assistance/supervision 1. Completes >2 steps with minimal assist 0. Unable, needs assist to keep from falling Standing with one foot in front: Numbers; 0-4: 2  4. Independent tandem for 30 seconds  3. Independent foot ahead for 30 seconds  2. Independent small step for 30 seconds  1. Needs help to step, but can hold for 15 seconds  0. Loses balance while standing/stepping Standing on one foot: Numbers; 0-4: 3 4. Holds >10 seconds 3. Holds 5-10 seconds 2. Holds >/=3 seconds  1. Holds <3 seconds 0. Unable   Total Score: 42/56  GAIT: Distance walked: 180 Assistive device utilized: None Level of assistance: Min A Comments: falls to the L with gait in  busy gym environment Dynamic gait: Head turns horizontal and vertical min A needed . Easily distracted.  Did not complete test due to time.    TODAY'S TREATMENT:  DATE: 02/27/22  PT eval.  HEP initiated.  Dynamic gait assessment initiated. Berg balance test.   PATIENT EDUCATION:  Education details: PT, HEP, POC Person educated: Patient Education method: Theatre stage manager Education comprehension: verbalized understanding and needs further education  HOME EXERCISE PROGRAM: Access Code: [email or web address] URL: [email or web address] Date: 02/27/2022 Prepared by: Raeford Razor  Exercises - Supine Lower Trunk Rotation  - 1 x daily - 7 x weekly - 2 sets - 10 reps - 10 hold - Supine Bridge  - 1 x daily - 7 x weekly - 2 sets - 10 reps - 5 hold - Seated Shoulder Horizontal Abduction with Resistance  - 1 x daily - 7 x weekly - 2 sets - 10 reps - 5 hold  ASSESSMENT:  CLINICAL IMPRESSION: Patient is a 82 y.o. female who was seen today for physical therapy evaluation and treatment for mid back pain as well as balance deficits. Her main concern is balance and this will be incorporated into her back and postural exercises.    OBJECTIVE IMPAIRMENTS: Abnormal gait, decreased balance, decreased cognition, decreased coordination, decreased endurance, decreased knowledge of use of DME, decreased mobility, difficulty walking, decreased strength, dizziness, increased fascial restrictions, postural dysfunction, and pain.   ACTIVITY LIMITATIONS: carrying, lifting, bending, sitting, standing, squatting, stairs, transfers, bed mobility, and locomotion level  PARTICIPATION LIMITATIONS: community activity and yard work  PERSONAL FACTORS: Time since onset of injury/illness/exacerbation and 3+ comorbidities: vertigo, cancer, anxiety  are also affecting patient's functional outcome.    REHAB POTENTIAL: Good  CLINICAL DECISION MAKING: Evolving/moderate complexity  EVALUATION COMPLEXITY: Moderate   GOALS: Goals reviewed with patient? No  SHORT TERM GOALS: Target date: 03/27/2022    Pt will be able to show independence with HEP for posture, balance  Baseline: Goal status: INITIAL  2.  Pt will agree to try walking with a cane to improve her balance  Baseline:  Goal status: INITIAL  3.  Pt will be able to show improved stability with sit to stand  Baseline:  Goal status: INITIAL   LONG TERM GOALS: Target date: 04/24/2022  Pt will be able to improve postural awareness with ADLs to reduce strain on neck and low back  Baseline:  Goal status: INITIAL  2.  Pt will be able to improve FOTO score to 48/56 to reduce fall risk.  Baseline: 42/56 Goal status: INITIAL  3.  Pt will be able to show independence with HEP and consistency for long term effect.  Baseline:  Goal status: INITIAL  4.  Pt will be able to show improved gait stability in busy environment, without sway to L consistently > 300 feet with LRAD Baseline:  Goal status: INITIAL  5.  Further goals related to balance TBA  Baseline:  Goal status: INITIAL   PLAN:  PT FREQUENCY: 2x/week  PT DURATION: 8 weeks  PLANNED INTERVENTIONS: Therapeutic exercises, Therapeutic activity, Neuromuscular re-education, Balance training, Gait training, Patient/Family education, Self Care, Joint mobilization, Cryotherapy, Moist heat, Manual therapy, and Re-evaluation.  PLAN FOR NEXT SESSION: Nustep for endurance, check HEP, TUG, balance   Raeford Razor, PT 02/27/22 3:39 PM Phone: [phone] Fax: [phone]   Daytona Retana, PT 02/27/2022, 3:13 PM   PHYSICAL THERAPY DISCHARGE SUMMARY  Visits from Start of Care: 1  Current functional level related to goals / functional outcomes: NA   Remaining deficits: NA   Education / Equipment: nA   Patient agrees to discharge. Patient goals were not met.  Patient is being discharged due to not returning since the  last visit.  Raeford Razor, PT 04/05/22 9:19 AM Phone: [phone] Fax: [phone]

## 2022-02-28 ENCOUNTER — Telehealth: Payer: Self-pay | Admitting: Adult Health

## 2022-02-28 NOTE — Telephone Encounter (Signed)
Scheduled appointments per 1/15 los. Patient is aware of all made appointments.

## 2022-03-04 ENCOUNTER — Encounter: Payer: Self-pay | Admitting: Oncology

## 2022-03-04 NOTE — Assessment & Plan Note (Signed)
[name] [name] is an 82 year old woman with history of recurrent breast cancer initially diagnosed in 2002 is triple negative with a local recurrence in 2004, then an additional recurrence in 2018 in the left breast weakly estrogen positive status postlumpectomy followed by adjuvant chemotherapy, adjuvant radiation, and antiestrogen therapy with anastrozole which began in 2019.    [name] has had breast cancer 3 times.  She now has increased shortness of breath on exertion along with a 10 pound weight loss.  Giving this history I placed orders for CT chest abdomen pelvis to fully evaluate for metastatic breast cancer.  We will get this scheduled and will follow-up with her after her scans are completed.  She also has follow-up in July 2024 which we will keep on the schedule.

## 2022-03-05 ENCOUNTER — Ambulatory Visit (INDEPENDENT_AMBULATORY_CARE_PROVIDER_SITE_OTHER): Payer: Medicare Other | Admitting: Internal Medicine

## 2022-03-05 ENCOUNTER — Encounter: Payer: Self-pay | Admitting: Internal Medicine

## 2022-03-05 VITALS — BP 120/70 | HR 84 | Ht 63.0 in | Wt 135.8 lb

## 2022-03-05 DIAGNOSIS — E559 Vitamin D deficiency, unspecified: Secondary | ICD-10-CM

## 2022-03-05 DIAGNOSIS — I10 Essential (primary) hypertension: Secondary | ICD-10-CM | POA: Diagnosis not present

## 2022-03-05 DIAGNOSIS — R1313 Dysphagia, pharyngeal phase: Secondary | ICD-10-CM

## 2022-03-05 DIAGNOSIS — E538 Deficiency of other specified B group vitamins: Secondary | ICD-10-CM

## 2022-03-05 DIAGNOSIS — E890 Postprocedural hypothyroidism: Secondary | ICD-10-CM

## 2022-03-05 DIAGNOSIS — K219 Gastro-esophageal reflux disease without esophagitis: Secondary | ICD-10-CM

## 2022-03-05 DIAGNOSIS — R1310 Dysphagia, unspecified: Secondary | ICD-10-CM | POA: Insufficient documentation

## 2022-03-05 DIAGNOSIS — I679 Cerebrovascular disease, unspecified: Secondary | ICD-10-CM

## 2022-03-05 DIAGNOSIS — R270 Ataxia, unspecified: Secondary | ICD-10-CM

## 2022-03-05 MED ORDER — PANTOPRAZOLE SODIUM 40 MG PO TBEC: 40.0000 mg | DELAYED_RELEASE_TABLET | Freq: Every day | ORAL | 3 refills | Status: DC

## 2022-03-05 NOTE — Assessment & Plan Note (Signed)
In PT - it is helping

## 2022-03-05 NOTE — Assessment & Plan Note (Signed)
On B12 

## 2022-03-05 NOTE — Assessment & Plan Note (Signed)
Worse. Start Protonix GI ref

## 2022-03-05 NOTE — Assessment & Plan Note (Signed)
On Vit D 

## 2022-03-05 NOTE — Assessment & Plan Note (Signed)
On Plavix

## 2022-03-05 NOTE — Assessment & Plan Note (Signed)
On Telmisartan at lower dose

## 2022-03-05 NOTE — Assessment & Plan Note (Signed)
Continue Levoxyl and Cytomel. Labs are OK

## 2022-03-05 NOTE — Progress Notes (Signed)
Subjective:  Patient ID: [name] [name], female    DOB: Apr 22, 1940  Age: 82 y.o. MRN: [id number]  CC: Follow-up (34monthf/u. Dry mouth with a sore throat.)   HPI Ronniesha T Farooq presents for depression, hypothyroidism, dyslipidemia f/u C/o ST x 2-3 months - worse It is hard to swallow bread -- recent....  Outpatient Medications Prior to Visit  Medication Sig Dispense Refill   ALPRAZolam (XANAX) 1 MG tablet TAKE 1/2 TO 1 TABLET BY MOUTH 2 TIMES DAILY AS NEEDED FOR ANXIETY 60 tablet 3   anastrozole (ARIMIDEX) 1 MG tablet Take 1 tablet (1 mg total) by mouth daily. 90 tablet 1   cholecalciferol (VITAMIN D) 1000 units tablet Take 2 tablets (2,000 Units total) by mouth daily. 100 tablet 3   clopidogrel (PLAVIX) 75 MG tablet TAKE 1 TABLET (75 MG TOTAL) BY MOUTH DAILY FOR 21 DAYS. 90 tablet 3   guaiFENesin (MUCINEX) 600 MG 12 hr tablet Take 1 tablet (600 mg total) by mouth 2 (two) times daily. 60 tablet 1   levothyroxine (SYNTHROID) 112 MCG tablet Take 1 tablet (112 mcg total) by mouth daily. 90 tablet 3   liothyronine (CYTOMEL) 25 MCG tablet Take 0.5 tablets (12.5 mcg total) by mouth daily. 30 tablet 5   meclizine (ANTIVERT) 12.5 MG tablet Take 1 tablet (12.5 mg total) by mouth 3 (three) times daily as needed for dizziness. 60 tablet 1   metoprolol succinate (TOPROL-XL) 25 MG 24 hr tablet Take 0.5 tablets (12.5 mg total) by mouth at bedtime. 30 tablet 5   rosuvastatin (CRESTOR) 20 MG tablet Take 1 tablet (20 mg total) by mouth daily. 90 tablet 1   telmisartan (MICARDIS) 20 MG tablet Take 1 tablet (20 mg total) by mouth daily. 90 tablet 3   venlafaxine XR (EFFEXOR XR) 150 MG 24 hr capsule Take 1 capsule (150 mg total) by mouth daily with breakfast. 90 capsule 1   vitamin B-12 (CYANOCOBALAMIN) 1000 MCG tablet Take 1,000 mcg by mouth daily.     No facility-administered medications prior to visit.    ROS: Review of Systems  Constitutional:  Negative for activity change, appetite change,  chills, fatigue and unexpected weight change.  HENT:  Negative for congestion, mouth sores and sinus pressure.   Eyes:  Negative for visual disturbance.  Respiratory:  Negative for cough and chest tightness.   Gastrointestinal:  Negative for abdominal pain and nausea.  Genitourinary:  Negative for difficulty urinating, frequency and vaginal pain.  Musculoskeletal:  Positive for arthralgias. Negative for back pain and gait problem.  Skin:  Negative for pallor and rash.  Neurological:  Negative for dizziness, tremors, weakness, numbness and headaches.  Hematological:  Does not bruise/bleed easily.  Psychiatric/Behavioral:  Positive for dysphoric mood. Negative for confusion, sleep disturbance and suicidal ideas. The patient is nervous/anxious.     Objective:  BP 120/70 (BP Location: Left Arm, Patient Position: Sitting, Cuff Size: Large)   Pulse 84   Ht '5\' 3"'$  (1.6 m)   Wt 135 lb 12.8 oz (61.6 kg)   SpO2 99%   BMI 24.06 kg/m   BP Readings from Last 3 Encounters:  03/05/22 120/70  02/26/22 103/83  01/01/22 118/82    Wt Readings from Last 3 Encounters:  03/05/22 135 lb 12.8 oz (61.6 kg)  02/26/22 133 lb 9.6 oz (60.6 kg)  01/01/22 135 lb (61.2 kg)    Physical Exam Constitutional:      General: She is not in acute distress.    Appearance: Normal  appearance. She is well-developed.  HENT:     Head: Normocephalic.     Right Ear: External ear normal.     Left Ear: External ear normal.     Nose: Nose normal.  Eyes:     General:        Right eye: No discharge.        Left eye: No discharge.     Conjunctiva/sclera: Conjunctivae normal.     Pupils: Pupils are equal, round, and reactive to light.  Neck:     Thyroid: No thyromegaly.     Vascular: No JVD.     Trachea: No tracheal deviation.  Cardiovascular:     Rate and Rhythm: Normal rate and regular rhythm.     Heart sounds: Normal heart sounds.  Pulmonary:     Effort: No respiratory distress.     Breath sounds: No stridor.  No wheezing.  Abdominal:     General: Bowel sounds are normal. There is no distension.     Palpations: Abdomen is soft. There is no mass.     Tenderness: There is no abdominal tenderness. There is no guarding or rebound.  Musculoskeletal:        General: No tenderness.     Cervical back: Normal range of motion and neck supple. No rigidity.  Lymphadenopathy:     Cervical: No cervical adenopathy.  Skin:    Findings: No erythema or rash.  Neurological:     Cranial Nerves: No cranial nerve deficit.     Motor: No abnormal muscle tone.     Coordination: Coordination normal.     Deep Tendon Reflexes: Reflexes normal.  Psychiatric:        Behavior: Behavior normal.        Thought Content: Thought content normal.        Judgment: Judgment normal.     Lab Results  Component Value Date   WBC 10.6 (H) 02/26/2022   HGB 12.5 02/26/2022   HCT 38.7 02/26/2022   PLT 332 02/26/2022   GLUCOSE 92 02/26/2022   CHOL 134 08/10/2020   TRIG 263 (H) 08/10/2020   HDL 37 (L) 08/10/2020   LDLDIRECT 125.0 06/05/2015   LDLCALC 44 08/10/2020   ALT 13 02/26/2022   AST 21 02/26/2022   NA 144 02/26/2022   K 4.4 02/26/2022   CL 109 02/26/2022   CREATININE 1.45 (H) 02/26/2022   BUN 22 02/26/2022   CO2 23 02/26/2022   TSH 0.22 (L) 11/29/2021   INR 1.1 08/16/2021   HGBA1C 6.4 06/18/2021    US Abdomen Complete  Result Date: 12/14/2021 CLINICAL DATA:  LBP.  CRI. EXAM: ABDOMEN ULTRASOUND COMPLETE COMPARISON:  CT abdomen pelvis 10/10/2017 FINDINGS: Gallbladder: No gallstones or wall thickening visualized. No sonographic Murphy sign noted by sonographer. Common bile duct: Diameter: 0.4 cm Liver: No focal lesion identified. Diffuse increased echogenicity. Portal vein is patent on color Doppler imaging with normal direction of blood flow towards the liver. IVC: No abnormality visualized. Pancreas: Not well visualized due to overlying bowel gas. Spleen: Size and appearance within normal limits. Right Kidney:  Length: 8.9. Diffuse increased echogenicity and mild cortical thinning. No hydronephrosis visualized. A 4.0 cm anechoic exophytic benign cortical cyst is noted at the upper pole. No follow-up imaging indicated. Left Kidney: Length: 11.6. Diffuse increased echogenicity and mild cortical thinning. No hydronephrosis visualized. A 5.5 cm anechoic exophytic benign cortical cyst is noted at the lower pole. No follow-up imaging is indicated. Abdominal aorta: No aneurysm visualized. Other findings:  None. IMPRESSION: 1. Hepatic steatosis. 2. Mildly atrophic and echogenic kidneys, consistent with medical renal disease. Bilateral benign renal cysts. 3. Remainder of the exam is normal. Electronically Signed   By: Ileana Roup M.D.   On: 12/14/2021 17:33    Assessment & Plan:   Problem List Items Addressed This Visit       Cardiovascular and Mediastinum   Cerebrovascular disease    On Plavix      Essential hypertension    On Telmisartan at lower dose        Digestive   Dysphagia - Primary    New. Start Protonix ST: Neck CT was ok in 08/2021 GI ref       Relevant Orders   Ambulatory referral to Gastroenterology   GERD (gastroesophageal reflux disease)    Worse. Start Protonix GI ref       Relevant Medications   pantoprazole (PROTONIX) 40 MG tablet   Other Relevant Orders   Ambulatory referral to Gastroenterology     Endocrine   HYPOTHYROIDISM, POSTSURGICAL    Continue Levoxyl and Cytomel. Labs are OK        Other   Ataxia    In PT - it is helping      B12 deficiency    On B12      Vitamin D deficiency    On Vit D         Meds ordered this encounter  Medications   pantoprazole (PROTONIX) 40 MG tablet    Sig: Take 1 tablet (40 mg total) by mouth daily.    Dispense:  90 tablet    Refill:  3      Follow-up: Return in about 3 months (around 06/04/2022) for a follow-up visit.  Walker Kehr, MD

## 2022-03-05 NOTE — Assessment & Plan Note (Signed)
New. Start Protonix ST: Neck CT was ok in 08/2021 GI ref

## 2022-03-06 ENCOUNTER — Ambulatory Visit: Payer: Medicare Other | Admitting: Physical Therapy

## 2022-03-06 NOTE — Therapy (Deleted)
OUTPATIENT PHYSICAL THERAPY TREATMENT NOTE   Patient Name: [name] [name] MRN: [id number] DOB:12-Jan-1941, 82 y.o., female Today's Date: 03/06/2022  PCP: *** REFERRING PROVIDER: ***  END OF SESSION:    Past Medical History:  Diagnosis Date   Allergic rhinitis    Anxiety    Breast cancer ([place]) hx 2004   recurrent 2006 DR Magrinat   Family history of breast cancer    Genetic testing 12/05/2016   Multi-Cancer panel (83 genes) @ [facility] - Monoallelic mutation in [place] (carrier)   HTN (hypertension)    Hyperlipidemia    Hypothyroidism    Personal history of chemotherapy 2002   Personal history of radiation therapy 2002   Psoriasis    S/P thyroidectomy 07/2005   2 cm largest diameter (1 other tiny focus)/ i-131 rx 99 [facility] 08/2005   Thyroid cancer ([place])    Papillary Stage 1 - Dr Loanne Drilling   Vitamin B12 deficiency 2009   Vitamin D deficiency 2009   Past Surgical History:  Procedure Laterality Date   BREAST LUMPECTOMY     BREAST LUMPECTOMY WITH RADIOACTIVE SEED AND SENTINEL LYMPH NODE BIOPSY Left 09/11/2016   Procedure: LEFT BREAST LUMPECTOMY WITH RADIOACTIVE SEED AND LEFT AXILLARY SENTINEL LYMPH NODE BIOPSY WITH BLUE DYE INJECTION;  Surgeon: [name], MD;  Location: [facility];  Service: General;  Laterality: Left;   IR FLUORO GUIDE PORT INSERTION RIGHT  09/13/2016   IR REMOVAL TUN ACCESS W/ PORT W/O FL MOD SED  12/30/2016   IR US GUIDE VASC ACCESS RIGHT  09/13/2016   MASTECTOMY Right    Right   PORTACATH PLACEMENT N/A 09/11/2016   Procedure: ATTEMPTED INSERTION PORT-A-CATH WITH ULTRA SOUND GUIDANCE;  Surgeon: [name], MD;  Location: [facility];  Service: General;  Laterality: N/A;   REDUCTION MAMMAPLASTY Left 2005   THYROIDECTOMY  2007   Patient Active Problem List   Diagnosis Date Noted   GERD (gastroesophageal reflux disease) 03/05/2022   Dysphagia 03/05/2022   Low back pain 12/04/2021   Low back problem 12/04/2021   Gait  disorder 12/04/2021   Palpitations 02/19/2021   DOE (dyspnea on exertion) 11/16/2020   Chronic sinusitis 11/16/2020   Upper respiratory infection 09/28/2020   Retinal artery occlusion, branch, right 08/09/2020   History of TIA (transient ischemic attack) 08/09/2020   Hyperparathyroidism ([place]) 06/13/2020   Skin lesion 03/21/2020   Cerebrovascular disease 03/21/2020   Pruritus 03/14/2020   Ataxia 07/20/2019   Seroma of breast 07/20/2019   Cerumen impaction 03/23/2019   Wrist pain 03/23/2019   Right ear pain 06/27/2018   Coronary artery disease 01/20/2018   Breast cyst, left 09/17/2017   Aortic atherosclerosis ([place]) 07/28/2017   Osteoporosis 05/07/2017   Genetic testing 12/05/2016   Family history of breast cancer    Port catheter in place 10/23/2016   Encounter for antineoplastic chemotherapy 10/23/2016   Malignant neoplasm of upper-outer quadrant of left breast in female, estrogen receptor positive ([place]) 07/29/2016   Well adult exam 06/05/2015   Neoplasm of uncertain behavior of skin 06/05/2015   Adenopathy, cervical 06/05/2015   Sinus tachycardia 06/07/2014   Wart viral 10/18/2011   Hyperglycemia 11/06/2010   Actinic keratoses 05/25/2010   Essential hypertension 02/23/2010   SHOULDER PAIN 11/22/2009   Chronic fatigue 11/08/2009   RLQ PAIN 11/08/2009   VERTIGO 07/11/2009   ECZEMA 10/19/2008   CT, CHEST, ABNORMAL 05/30/2008   STYE 02/17/2008   B12 deficiency 12/16/2007   Vitamin D deficiency 12/16/2007   HYPOTHYROIDISM,  POSTSURGICAL 12/01/2007   Acute maxillary sinusitis 11/11/2007   ALLERGIC RESPIRATORY DISEASE, EXTRINSIC 11/11/2007   THYROID CANCER 08/19/2007   Dyslipidemia 08/19/2007   Anxiety disorder 08/19/2007   Situational depression 08/19/2007   Allergic rhinitis 08/19/2007    REFERRING DIAG: ***  THERAPY DIAG:  No diagnosis found.  Rationale for Evaluation and Treatment {[id number]}  PERTINENT HISTORY: ***  PRECAUTIONS: ***  SUBJECTIVE:                                                                                                                                                                                       SUBJECTIVE STATEMENT:  ***   PAIN:  Are you having pain? {[id number]}   OBJECTIVE: (objective measures completed at initial evaluation unless otherwise dated)   (Copy Eval's Objective through Plan section here)   Maicee Ullman, PT 03/06/2022, 8:12 AM

## 2022-03-07 ENCOUNTER — Ambulatory Visit (HOSPITAL_COMMUNITY)
Admission: RE | Admit: 2022-03-07 | Discharge: 2022-03-07 | Disposition: A | Discharge status: Home / Self Care | Payer: Medicare Other | Source: Ambulatory Visit | Attending: Adult Health | Admitting: Adult Health

## 2022-03-07 DIAGNOSIS — M546 Pain in thoracic spine: Secondary | ICD-10-CM

## 2022-03-07 DIAGNOSIS — Z170 Estrogen receptor positive status [ER+]: Secondary | ICD-10-CM | POA: Diagnosis not present

## 2022-03-07 DIAGNOSIS — R634 Abnormal weight loss: Secondary | ICD-10-CM | POA: Diagnosis not present

## 2022-03-07 DIAGNOSIS — C50412 Malignant neoplasm of upper-outer quadrant of left female breast: Secondary | ICD-10-CM | POA: Insufficient documentation

## 2022-03-07 DIAGNOSIS — N858 Other specified noninflammatory disorders of uterus: Secondary | ICD-10-CM | POA: Diagnosis not present

## 2022-03-07 DIAGNOSIS — I700 Atherosclerosis of aorta: Secondary | ICD-10-CM | POA: Diagnosis not present

## 2022-03-07 DIAGNOSIS — R0602 Shortness of breath: Secondary | ICD-10-CM | POA: Insufficient documentation

## 2022-03-07 DIAGNOSIS — K5730 Diverticulosis of large intestine without perforation or abscess without bleeding: Secondary | ICD-10-CM | POA: Diagnosis not present

## 2022-03-07 DIAGNOSIS — K769 Liver disease, unspecified: Secondary | ICD-10-CM | POA: Diagnosis not present

## 2022-03-07 DIAGNOSIS — Z853 Personal history of malignant neoplasm of breast: Secondary | ICD-10-CM | POA: Diagnosis not present

## 2022-03-07 MED ORDER — IOHEXOL 300 MG/ML  SOLN
100.0000 mL | Freq: Once | INTRAMUSCULAR | Status: AC | PRN
  Administered 2022-03-07: 100 mL via INTRAVENOUS

## 2022-03-08 ENCOUNTER — Ambulatory Visit: Payer: Medicare Other | Admitting: Physical Therapy

## 2022-03-11 ENCOUNTER — Telehealth: Payer: Self-pay | Admitting: Internal Medicine

## 2022-03-11 ENCOUNTER — Encounter: Payer: Self-pay | Admitting: Physical Therapy

## 2022-03-11 ENCOUNTER — Inpatient Hospital Stay (HOSPITAL_BASED_OUTPATIENT_CLINIC_OR_DEPARTMENT_OTHER): Payer: Medicare Other | Admitting: Adult Health

## 2022-03-11 ENCOUNTER — Encounter: Payer: Self-pay | Admitting: Adult Health

## 2022-03-11 ENCOUNTER — Ambulatory Visit: Payer: Medicare Other | Admitting: Physical Therapy

## 2022-03-11 ENCOUNTER — Other Ambulatory Visit: Payer: Self-pay

## 2022-03-11 ENCOUNTER — Telehealth: Payer: Self-pay | Admitting: Physical Therapy

## 2022-03-11 VITALS — BP 107/93 | HR 86 | Temp 97.9°F | Resp 16 | Ht 63.0 in | Wt 135.0 lb

## 2022-03-11 DIAGNOSIS — M810 Age-related osteoporosis without current pathological fracture: Secondary | ICD-10-CM | POA: Diagnosis not present

## 2022-03-11 DIAGNOSIS — Z8051 Family history of malignant neoplasm of kidney: Secondary | ICD-10-CM | POA: Diagnosis not present

## 2022-03-11 DIAGNOSIS — Z79811 Long term (current) use of aromatase inhibitors: Secondary | ICD-10-CM | POA: Diagnosis not present

## 2022-03-11 DIAGNOSIS — C50412 Malignant neoplasm of upper-outer quadrant of left female breast: Secondary | ICD-10-CM

## 2022-03-11 DIAGNOSIS — Z170 Estrogen receptor positive status [ER+]: Secondary | ICD-10-CM

## 2022-03-11 DIAGNOSIS — I2510 Atherosclerotic heart disease of native coronary artery without angina pectoris: Secondary | ICD-10-CM

## 2022-03-11 DIAGNOSIS — Z803 Family history of malignant neoplasm of breast: Secondary | ICD-10-CM | POA: Diagnosis not present

## 2022-03-11 DIAGNOSIS — Z807 Family history of other malignant neoplasms of lymphoid, hematopoietic and related tissues: Secondary | ICD-10-CM | POA: Diagnosis not present

## 2022-03-11 DIAGNOSIS — Z85850 Personal history of malignant neoplasm of thyroid: Secondary | ICD-10-CM | POA: Diagnosis not present

## 2022-03-11 DIAGNOSIS — Z9221 Personal history of antineoplastic chemotherapy: Secondary | ICD-10-CM | POA: Diagnosis not present

## 2022-03-11 DIAGNOSIS — Z800 Family history of malignant neoplasm of digestive organs: Secondary | ICD-10-CM | POA: Diagnosis not present

## 2022-03-11 DIAGNOSIS — Z9011 Acquired absence of right breast and nipple: Secondary | ICD-10-CM | POA: Diagnosis not present

## 2022-03-11 DIAGNOSIS — I10 Essential (primary) hypertension: Secondary | ICD-10-CM | POA: Diagnosis not present

## 2022-03-11 DIAGNOSIS — Z923 Personal history of irradiation: Secondary | ICD-10-CM | POA: Diagnosis not present

## 2022-03-11 DIAGNOSIS — Z806 Family history of leukemia: Secondary | ICD-10-CM | POA: Diagnosis not present

## 2022-03-11 DIAGNOSIS — Z801 Family history of malignant neoplasm of trachea, bronchus and lung: Secondary | ICD-10-CM | POA: Diagnosis not present

## 2022-03-11 NOTE — Therapy (Deleted)
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)   HTN (hypertension)    Hyperlipidemia    Hypothyroidism    Personal history of chemotherapy 2002   Personal history of radiation therapy 2002   Psoriasis    S/P thyroidectomy 07/2005   2 cm largest diameter (1 other tiny focus)/ i-131 rx 99 [facility] 08/2005   Thyroid cancer ([place])    Papillary Stage 1 - Dr Loanne Drilling   Vitamin B12 deficiency 2009   Vitamin D deficiency 2009   Past Surgical History:  Procedure Laterality Date   BREAST LUMPECTOMY     BREAST LUMPECTOMY WITH RADIOACTIVE SEED AND SENTINEL LYMPH NODE BIOPSY Left 09/11/2016   Procedure: LEFT BREAST LUMPECTOMY WITH RADIOACTIVE SEED AND LEFT AXILLARY SENTINEL LYMPH NODE BIOPSY WITH BLUE DYE INJECTION;  Surgeon: [name], MD;  Location: [facility];  Service: General;  Laterality: Left;   IR FLUORO GUIDE PORT INSERTION RIGHT  09/13/2016   IR REMOVAL TUN ACCESS W/ PORT W/O FL MOD SED  12/30/2016   IR US GUIDE VASC ACCESS RIGHT  09/13/2016   MASTECTOMY Right    Right   PORTACATH PLACEMENT N/A 09/11/2016   Procedure: ATTEMPTED INSERTION PORT-A-CATH WITH ULTRA SOUND GUIDANCE;  Surgeon: [name], MD;  Location: [facility];  Service: General;  Laterality: N/A;   REDUCTION MAMMAPLASTY Left 2005   THYROIDECTOMY  2007   Patient Active Problem List   Diagnosis Date Noted   GERD (gastroesophageal reflux disease) 03/05/2022   Dysphagia 03/05/2022   Low back pain 12/04/2021   Low back problem 12/04/2021   Gait  disorder 12/04/2021   Palpitations 02/19/2021   DOE (dyspnea on exertion) 11/16/2020   Chronic sinusitis 11/16/2020   Upper respiratory infection 09/28/2020   Retinal artery occlusion, branch, right 08/09/2020   History of TIA (transient ischemic attack) 08/09/2020   Hyperparathyroidism ([place]) 06/13/2020   Skin lesion 03/21/2020   Cerebrovascular disease 03/21/2020   Pruritus 03/14/2020   Ataxia 07/20/2019   Seroma of breast 07/20/2019   Cerumen impaction 03/23/2019   Wrist pain 03/23/2019   Right ear pain 06/27/2018   Coronary artery disease 01/20/2018   Breast cyst, left 09/17/2017   Aortic atherosclerosis ([place]) 07/28/2017   Osteoporosis 05/07/2017   Genetic testing 12/05/2016   Family history of breast cancer    Port catheter in place 10/23/2016   Encounter for antineoplastic chemotherapy 10/23/2016   Malignant neoplasm of upper-outer quadrant of left breast in female, estrogen receptor positive ([place]) 07/29/2016   Well adult exam 06/05/2015   Neoplasm of uncertain behavior of skin 06/05/2015   Adenopathy, cervical 06/05/2015   Sinus tachycardia 06/07/2014   Wart viral 10/18/2011   Hyperglycemia 11/06/2010   Actinic keratoses 05/25/2010   Essential hypertension 02/23/2010   SHOULDER PAIN 11/22/2009   Chronic fatigue 11/08/2009   RLQ PAIN 11/08/2009   VERTIGO 07/11/2009   ECZEMA 10/19/2008   CT, CHEST, ABNORMAL 05/30/2008   STYE 02/17/2008   B12 deficiency 12/16/2007   Vitamin D deficiency 12/16/2007   HYPOTHYROIDISM,  POSTSURGICAL 12/01/2007   Acute maxillary sinusitis 11/11/2007   ALLERGIC RESPIRATORY DISEASE, EXTRINSIC 11/11/2007   THYROID CANCER 08/19/2007   Dyslipidemia 08/19/2007   Anxiety disorder 08/19/2007   Situational depression 08/19/2007   Allergic rhinitis 08/19/2007    REFERRING DIAG: ***  THERAPY DIAG:  No diagnosis found.  Rationale for Evaluation and Treatment {HABREHAB:27488}  PERTINENT HISTORY: ***  PRECAUTIONS: ***  SUBJECTIVE:                                                                                                                                                                                       SUBJECTIVE STATEMENT:  ***   PAIN:  Are you having pain? {OPRCPAIN:27236}   OBJECTIVE: (objective measures completed at initial evaluation unless otherwise dated)  DIAGNOSTIC FINDINGS:  None recent   PATIENT SURVEYS:  FOTO back pain 61%   SCREENING FOR RED FLAGS: Bowel or bladder incontinence: No Spinal tumors: No Cauda equina syndrome: No Compression fracture: No Abdominal aneurysm: No   COGNITION: Overall cognitive status: History of cognitive impairments - at baseline                              Patient reports her memory is declining short-term recall and processing SENSATION: WFL   MUSCLE LENGTH: NT    POSTURE: rounded shoulders, forward head, increased thoracic kyphosis, posterior pelvic tilt, right pelvic obliquity, and flexed trunk    PALPATION: TTP mid back , post scapular    LUMBAR ROM:    AROM eval  Flexion WFL   Extension WFL guarded for balance   Right lateral flexion    Left lateral flexion    Right rotation WNL  Left rotation WNL    (Blank rows = not tested)   LOWER EXTREMITY ROM:    NT on eval  Passive  Right eval Left eval  Hip flexion      Hip extension      Hip abduction      Hip adduction      Hip internal rotation      Hip external rotation      Knee flexion      Knee extension      Ankle dorsiflexion      Ankle plantarflexion      Ankle inversion      Ankle eversion       (Blank rows = not tested)   LOWER EXTREMITY MMT:                         WFL in sitting    MMT Right  eval Left eval  Hip flexion      Hip extension      Hip abduction      Hip adduction      Hip internal rotation      Hip external rotation      Knee flexion      Knee extension      Ankle dorsiflexion      Ankle plantarflexion      Ankle inversion      Ankle eversion        (Blank rows = not tested)   LUMBAR SPECIAL TESTS:  NT    FUNCTIONAL TESTS:  5 times sit to stand: 28 sec, leans posteriorly, uses knees against the chair , min A  30 seconds chair stand test Timed up and go (TUG): NT on eval  Berg Balance Scale: 42/56     BERG BALANCE   Sitting to Standing: Numbers; 0-4: 4            4. Stands without using hands and stabilize independently            3. Stands independently using hands            2. Stands using hands after multiple trials            1. Min A to stand            0. Mod-Max A to stand Standing unsupported: Numbers; 0-4: 4            4. Stands safely for 2 minutes            3. Stands 2 minutes with supervision            2. Stands 30 seconds unsupported            1. Needs several tries to stand unsupported for 30 seconds            0. Unable to stand unsupported for 30 seconds Sitting unsupported: Numbers; 0-4: 4            4. Sits for 2 minutes independently            3. Sits for 2 minutes with supervision            2. Able to sit 30 seconds            1. Able to sit 10 seconds            0. Unable to sit for 10 seconds Standing to Sitting: Numbers; 0-4: 2 4. Sits safely with minimal use of hands 3. Controls descent with hands 2. Uses back of legs against chair to control descent 1. Sits independently, but uncontrolled descent 0. Needs assistance Transfers: Numbers; 0-4: 3            4. Transfers safely with minor use of hands            3. Transfers safely definite use of hands            2. Transfers with verbal cueing/supervision            1. Needs 1 person assist            0. Needs 2 person assist  Standing with eyes closed: Numbers; 0-4: 4            4. Stands safely for 10 seconds            3. Stands 10 seconds with  supervision             2. Able to stand for 3 seconds            1. Unable to keep eyes closed for 3 seconds, but is safe            0. Needs assist to keep from falling Standing with  feet together: Numbers; 0-4: 3 4. Stands for 1 minute safely 3. Stands for 1 minute with supervision 2. Unable to hold for 30 seconds            1. Needs help to attain position but can hold for 15 seconds            0. Needs help to attain position and unable to hold for 15 seconds Reaching forward with outstretched arm: Numbers; 0-4: 3            4. Reaches forward 10 inches            3. Reaches forward 5 inches            2. Reaches forward 2 inches            1. Reaches forward with supervision            0. Loses balance/requires assistace Retrieving object from the floor: Numbers; 0-4: 3 4. Able to pick up easily and safely 3. Able to pick up with supervision 2. Unable to pick up, but reaches within 1-2 inches independently 1. Unable to pick up and needs supervision 0. Unable/needs assistance to keep from falling  Turning to look behind: Numbers; 0-4: 4            4. Looks behind from both sides and weight shifts well            3. Looks behind one side only, other side less weight shift            2. Turns sideways only, maintains balance            1. Needs supervision when turning            0. Needs assistance  Turning 360 degrees: Numbers; 0-4: 1            4. Able to turn in </=4 seconds            3. Able to turn on one side in </= 4 seconds             2. Able to turn slowly, but safely            1. Needs supervision or verbal cueing            0. Needs assistance Place alternate foot on stool: Numbers; 0-4: 2 4. Completes 8 steps in 20 seconds 3. Completes 8 steps in >20 seconds 2. 4 steps without assistance/supervision 1. Completes >2 steps with minimal assist 0. Unable, needs assist to keep from falling Standing with one foot in front: Numbers; 0-4: 2            4. Independent tandem for 30 seconds            3. Independent foot ahead for 30 seconds            2. Independent small step for 30 seconds            1. Needs help to step, but can hold for 15  seconds  0. Loses balance while standing/stepping Standing on one foot: Numbers; 0-4: 3 4. Holds >10 seconds 3. Holds 5-10 seconds 2. Holds >/=3 seconds  1. Holds <3 seconds 0. Unable    Total Score: 42/56  GAIT: Distance walked: 180 Assistive device utilized: None Level of assistance: Min A Comments: falls to the L with gait in busy gym environment Dynamic gait: Head turns horizontal and vertical min A needed . Easily distracted.  Did not complete test due to time.      TODAY'S TREATMENT:                                                                                                                              DATE: 02/27/22  PT eval.  HEP initiated.  Dynamic gait assessment initiated. Berg balance test.    PATIENT EDUCATION:  Education details: PT, HEP, POC Person educated: Patient Education method: Theatre stage manager Education comprehension: verbalized understanding and needs further education   HOME EXERCISE PROGRAM: Access Code: [email or web address] URL: [email or web address] Date: 02/27/2022 Prepared by: Raeford Razor   Exercises - Supine Lower Trunk Rotation  - 1 x daily - 7 x weekly - 2 sets - 10 reps - 10 hold - Supine Bridge  - 1 x daily - 7 x weekly - 2 sets - 10 reps - 5 hold - Seated Shoulder Horizontal Abduction with Resistance  - 1 x daily - 7 x weekly - 2 sets - 10 reps - 5 hold   ASSESSMENT:   CLINICAL IMPRESSION: Patient is a 82 y.o. female who was seen today for physical therapy evaluation and treatment for mid back pain as well as balance deficits. Her main concern is balance and this will be incorporated into her back and postural exercises.     OBJECTIVE IMPAIRMENTS: Abnormal gait, decreased balance, decreased cognition, decreased coordination, decreased endurance, decreased knowledge of use of DME, decreased mobility, difficulty walking, decreased strength, dizziness, increased fascial restrictions, postural dysfunction, and pain.     ACTIVITY LIMITATIONS: carrying, lifting, bending, sitting, standing, squatting, stairs, transfers, bed mobility, and locomotion level   PARTICIPATION LIMITATIONS: community activity and yard work   PERSONAL FACTORS: Time since onset of injury/illness/exacerbation and 3+ comorbidities: vertigo, cancer, anxiety  are also affecting patient's functional outcome.    REHAB POTENTIAL: Good   CLINICAL DECISION MAKING: Evolving/moderate complexity   EVALUATION COMPLEXITY: Moderate     GOALS: Goals reviewed with patient? No   SHORT TERM GOALS: Target date: 03/27/2022       Pt will be able to show independence with HEP for posture, balance  Baseline: Goal status: INITIAL   2.  Pt will agree to try walking with a cane to improve her balance  Baseline:  Goal status: INITIAL   3.  Pt will be able to show improved stability with sit to stand  Baseline:  Goal status: INITIAL     LONG TERM GOALS: Target date: 04/24/2022   Pt will be able  to improve postural awareness with ADLs to reduce strain on neck and low back  Baseline:  Goal status: INITIAL   2.  Pt will be able to improve FOTO score to 48/56 to reduce fall risk.  Baseline: 42/56 Goal status: INITIAL   3.  Pt will be able to show independence with HEP and consistency for long term effect.  Baseline:  Goal status: INITIAL   4.  Pt will be able to show improved gait stability in busy environment, without sway to L consistently > 300 feet with LRAD Baseline:  Goal status: INITIAL   5.  Further goals related to balance TBA  Baseline:  Goal status: INITIAL     PLAN:   PT FREQUENCY: 2x/week   PT DURATION: 8 weeks   PLANNED INTERVENTIONS: Therapeutic exercises, Therapeutic activity, Neuromuscular re-education, Balance training, Gait training, Patient/Family education, Self Care, Joint mobilization, Cryotherapy, Moist heat, Manual therapy, and Re-evaluation.   PLAN FOR NEXT SESSION: Nustep for endurance, check HEP, TUG,  balance    Raeford Razor, PT 02/27/22 3:39 PM Phone: [phone] Fax: [phone]    Daiveon Markman, PT 02/27/2022, 3:13 PM   (Copy Eval's Objective through Plan section here)   Baker Moronta, PT 03/11/2022, 8:05 AM

## 2022-03-11 NOTE — Telephone Encounter (Signed)
Called patient regarding her missed appointment this afternoon at 215.  Left a voicemail regarding this appointment and reminded of her upcoming Wed.   Stated cancellation/no-show policy and asked that she call us regarding her plans for future physical therapy.  1 more no-show will result in discharge Raeford Razor, PT  03/11/22 2:31 PM Phone: [phone] Fax: [phone]

## 2022-03-11 NOTE — Assessment & Plan Note (Signed)
[name] [name] is an 82 year old woman with stage Ib breast cancer here today for follow-up after undergoing scans.  Reviewed her CT chest abdomen pelvis results with her in detail which indicate no evidence of breast cancer recurrence in the chest abdomen or pelvis.  This is good news.  I did review the incidental findings including diverticulosis and aortic atherosclerosis.  Her aortic atherosclerosis is being managed by her primary care provider.  We discussed her voice change in the medication in addition by Dr. Alain Marion of Pro[employer].  I reviewed with her how Protonix works and how will help and recommended that she go ahead and pick it up and start taking it prior to her GI appointment that has not yet been scheduled but the referral is in process.  [name] [name] verbalized understanding of the above and already has an appointment to see [place] in follow-up in July 2024.  She knows to call in the interim for any questions or concerns.

## 2022-03-11 NOTE — Telephone Encounter (Signed)
Patient called and would like a referral to a cardiologist. If any questions or concerns patient can be reached at [phone].

## 2022-03-11 NOTE — Progress Notes (Signed)
[facility] Follow up:    Plotnikov, Evie Lacks, MD [place] [address]   DIAGNOSIS:  Cancer Staging  Malignant neoplasm of upper-outer quadrant of left breast in female, estrogen receptor positive ([place]) Staging form: Breast, AJCC 8th Edition - Pathologic: Stage IB (pT1b, pN0, cM0, G3, ER-, PR-, HER2-) - Unsigned Histologic grading system: 3 grade system   SUMMARY OF ONCOLOGIC HISTORY: (1) status post right lumpectomy and sentinel lymph node biopsy in September 2002 for multifocal breast carcinoma, triple negative.  Treated with CMF followed by radiation.    (2) Local recurrence in April 2004, status post right modified radical mastectomy with TRAM reconstruction for what proved to be a T1c N0, stage IA triple negative breast carcinoma.  Treated adjuvantly with paclitaxel and doxorubicin x4 in 2004.  Off treatment since August 2004 with no evidence of recurrence   (3) history of papillary thyroid cancer s/p thyroidectomy June 2007, s/p radioactive iodine July 2007   (4) status post left breast upper outer quadrant biopsy 08/21/2016 for a clinical T1b N0, stage IB invasive ductal carcinoma, grade 3, weakly estrogen receptor positive, progesterone receptor and HER-2 negative, with an MIB-1 of 80%.   (5) left lumpectomy and sentinel lymph node sampling 09/11/2016 found a pT1b pN0, stage IB invasive ductal carcinoma, grade 3, with negative margins.   (6) adjuvant chemotherapy consisting of carboplatin and gemcitabine given days 1 and 8 of each 21 day cycle 4 cycles, started 09/18/2016 (Neupogen on days 2 and 3, and Onpro on day 8 for chemotherapy induced neutropenia), last dose December 11, 2016   (7) adjuvant radiation completed 02/14/2017 Site/dose:   Left breast/ 2.5 Gy x 37f                  Boost/ 2.5Gy x 398f  (8) genetics testing 12/13/2016 through the multi-Cancer panel (83 genes) @ [facility] -found a monoallelic mutation in [place]);  NTHL1 c.268C>T (p.Gln90*) (a) there were no deleterious mutations in ALK, APC, ATM, AXIN2, BAP1, BARD1, BLM, BMPR1A, BRCA1, BRCA2, BRIP1, CASR, CDC73, CDH1, CDK4, CDKN1B, CDKN1C, CDKN2A, CEBPA, CHEK2, CTNNA1, DICER1, DIS3L2, EGFR, EPCAM, FH, FLCN, GATA2, GPC3, GREM1, HOXB13, HRAS, KIT, MAX, MEN1, MET, MITF, MLH1, MSH2, MSH3, MSH6, MUTYH, NBN, NF1, NF2, NTHL1, PALB2, PDGFRA, PHOX2B, PMS2, POLD1, POLE, POT1, PRKAR1A, PTCH1, PTEN, RAD50, RAD51C, RAD51D, RB1, RECQL4, RET, RUNX1, SDHA, SDHAF2, SDHB, SDHC, SDHD, SMAD4, SMARCA4, SMARCB1, SMARCE1, STK11, SUFU, TERC, TERT, TMEM127, TP53, TSC1, TSC2, VHL, WRN, WT1 (b) while homozygous mutations in the N[place]1 gene are associated with autosomal recessive polyposis, there is no evidence to suggest that a single mutation in this gene increases cancer risk.   (9) started anastrozole 03/14/2017 (a) bone density on 05/06/2017 showed a T score of -3.4--osteoporosis (b) started denosumab/Prolia June 2019, to be repeated every 6 months (C) repeat bone density on August 01, 2021 shows a T score of -3.3 in the left femoral neck.  CURRENT THERAPY: Anastrozole  INTERVAL HISTORY: PeZIOMARA BIRENBAUM89.o. female returns for f/u of her recent CT scans we ordered due to her 10 pound weight loss.  She is up 2 pounds.  Her scans demonstrated no etiology for the weight loss, in particular no cancer recurrence.   She notes that she still is experiencing some hoarseness and difficulty swallowing.  She has seen Dr. PlAlain Marionor this and he prescribed Protonix which she has not yet picked up along with the GI referral.   Patient Active Problem  List   Diagnosis Date Noted   GERD (gastroesophageal reflux disease) 03/05/2022   Dysphagia 03/05/2022   Low back pain 12/04/2021   Low back problem 12/04/2021   Gait disorder 12/04/2021   Palpitations 02/19/2021   DOE (dyspnea on exertion) 11/16/2020   Chronic sinusitis 11/16/2020   Upper respiratory infection 09/28/2020   Retinal  artery occlusion, branch, right 08/09/2020   History of TIA (transient ischemic attack) 08/09/2020   Hyperparathyroidism ([facility]) 06/13/2020   Skin lesion 03/21/2020   Cerebrovascular disease 03/21/2020   Pruritus 03/14/2020   Ataxia 07/20/2019   Seroma of breast 07/20/2019   Cerumen impaction 03/23/2019   Wrist pain 03/23/2019   Right ear pain 06/27/2018   Coronary artery disease 01/20/2018   Breast cyst, left 09/17/2017   Aortic atherosclerosis ([place]) 07/28/2017   Osteoporosis 05/07/2017   Genetic testing 12/05/2016   Family history of breast cancer    Port catheter in place 10/23/2016   Encounter for antineoplastic chemotherapy 10/23/2016   Malignant neoplasm of upper-outer quadrant of left breast in female, estrogen receptor positive ([place]) 07/29/2016   Well adult exam 06/05/2015   Neoplasm of uncertain behavior of skin 06/05/2015   Adenopathy, cervical 06/05/2015   Sinus tachycardia 06/07/2014   Wart viral 10/18/2011   Hyperglycemia 11/06/2010   Actinic keratoses 05/25/2010   Essential hypertension 02/23/2010   SHOULDER PAIN 11/22/2009   Chronic fatigue 11/08/2009   RLQ PAIN 11/08/2009   VERTIGO 07/11/2009   ECZEMA 10/19/2008   CT, CHEST, ABNORMAL 05/30/2008   STYE 02/17/2008   B12 deficiency 12/16/2007   Vitamin D deficiency 12/16/2007   HYPOTHYROIDISM, POSTSURGICAL 12/01/2007   Acute maxillary sinusitis 11/11/2007   ALLERGIC RESPIRATORY DISEASE, EXTRINSIC 11/11/2007   THYROID CANCER 08/19/2007   Dyslipidemia 08/19/2007   Anxiety disorder 08/19/2007   Situational depression 08/19/2007   Allergic rhinitis 08/19/2007    is allergic to pneumococcal vaccines, sulfa antibiotics, sulfacetamide sodium-sulfur, sulfamethoxazole-trimethoprim, tape, and tegaderm ag mesh [silver].  MEDICAL HISTORY: Past Medical History:  Diagnosis Date   Allergic rhinitis    Anxiety    Breast cancer ([facility]) hx 2004   recurrent 2006 DR Magrinat   Family history of breast cancer    Genetic  testing 12/05/2016   Multi-Cancer panel (83 genes) @ [facility] - Monoallelic mutation in [place] (carrier)   HTN (hypertension)    Hyperlipidemia    Hypothyroidism    Personal history of chemotherapy 2002   Personal history of radiation therapy 2002   Psoriasis    S/P thyroidectomy 07/2005   2 cm largest diameter (1 other tiny focus)/ i-131 rx 99 mci 08/2005   Thyroid cancer ([place])    Papillary Stage 1 - Dr Loanne Drilling   Vitamin B12 deficiency 2009   Vitamin D deficiency 2009    SURGICAL HISTORY: Past Surgical History:  Procedure Laterality Date   BREAST LUMPECTOMY     BREAST LUMPECTOMY WITH RADIOACTIVE SEED AND SENTINEL LYMPH NODE BIOPSY Left 09/11/2016   Procedure: LEFT BREAST LUMPECTOMY WITH RADIOACTIVE SEED AND LEFT AXILLARY SENTINEL LYMPH NODE BIOPSY WITH BLUE DYE INJECTION;  Surgeon: [name], MD;  Location: [facility];  Service: General;  Laterality: Left;   IR FLUORO GUIDE PORT INSERTION RIGHT  09/13/2016   IR REMOVAL TUN ACCESS W/ PORT W/O FL MOD SED  12/30/2016   IR US GUIDE VASC ACCESS RIGHT  09/13/2016   MASTECTOMY Right    Right   PORTACATH PLACEMENT N/A 09/11/2016   Procedure: ATTEMPTED INSERTION PORT-A-CATH WITH ULTRA SOUND GUIDANCE;  Surgeon:  [name], MD;  Location: [facility];  Service: General;  Laterality: N/A;   REDUCTION MAMMAPLASTY Left 2005   THYROIDECTOMY  2007    SOCIAL HISTORY: Social History   Socioeconomic History   Marital status: Married    Spouse name: Not on file   Number of children: 2   Years of education: Not on file   Highest education level: Not on file  Occupational History   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, 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  Tobacco Use   Smoking status: Never   Smokeless tobacco: Never  Substance and Sexual Activity   Alcohol use: No   Drug use: No   Sexual activity: Not Currently  Other Topics Concern   Not on file  Social History Narrative   GI - Dr  Earlean Shawl   Depression - Dr Toy Care   GYN - Dr Marianna Payment      Social Determinants of Health   Financial Resource Strain: Low Risk  (06/18/2021)   Overall Financial Resource Strain (CARDIA)    Difficulty of Paying Living Expenses: Not hard at all  Food Insecurity: No Food Insecurity (06/18/2021)   Hunger Vital Sign    Worried About Running Out of Food in the Last Year: Never true    [place] in the Last Year: Never true  Transportation Needs: No Transportation Needs (06/18/2021)   PRAPARE - Hydrologist (Medical): No    Lack of Transportation (Non-Medical): No  Physical Activity: Sufficiently Active (06/18/2021)   Exercise Vital Sign    Days of Exercise per Week: 5 days    Minutes of Exercise per Session: 30 min  Stress: No Stress Concern Present (06/18/2021)   [place]    Feeling of Stress : Not at all  Social Connections: Moderately Integrated (06/18/2021)   Social Connection and Isolation Panel [NHANES]    Frequency of Communication with Friends and Family: Three times a week    Frequency of Social Gatherings with Friends and Family: Three times a week    Attends Religious Services: More than 4 times per year    Active Member of Clubs or Organizations: No    Attends Archivist Meetings: Never    Marital Status: Married  Human resources officer Violence: Not At Risk (06/18/2021)   Humiliation, Afraid, Rape, and Kick questionnaire    Fear of Current or Ex-Partner: No    Emotionally Abused: No    Physically Abused: No    Sexually Abused: No    FAMILY HISTORY: Family History  Problem Relation Age of Onset   Stroke Mother    Allergies Mother    Asthma Mother    Clotting disorder Mother    Heart disease Father 61       MI   Allergies Sister    Asthma Sister    Breast cancer Sister 39       Lymphoma 9; currently 17   Lymphoma Sister    Hyperparathyroidism Sister    Asthma Brother     Leukemia Brother        dx 22s   Allergies Daughter    Asthma Daughter    Allergies Sister    Breast cancer Sister 63       currently 59   Kidney cancer Paternal Aunt        kidney ca; deceased 57   Liver cancer Paternal Uncle  unk. primary ("liver")   Throat cancer Maternal Grandmother        deceased 2   Lung cancer Maternal Grandfather        deceased 65   Pancreatic cancer Paternal Grandfather        deceased 98   Cancer Paternal Aunt        "abdominal"; deceased 17    Review of Systems  Constitutional:  Negative for appetite change, chills, fatigue, fever and unexpected weight change.  HENT:   Positive for voice change. Negative for hearing loss, lump/mass, sore throat and trouble swallowing.   Eyes:  Negative for eye problems and icterus.  Respiratory:  Negative for chest tightness, cough and shortness of breath.   Cardiovascular:  Negative for chest pain, leg swelling and palpitations.  Gastrointestinal:  Negative for abdominal distention, abdominal pain, constipation, diarrhea, nausea and vomiting.  Endocrine: Negative for hot flashes.  Genitourinary:  Negative for difficulty urinating.   Musculoskeletal:  Negative for arthralgias.  Skin:  Negative for itching and rash.  Neurological:  Negative for dizziness, extremity weakness, headaches and numbness.  Hematological:  Negative for adenopathy. Does not bruise/bleed easily.  Psychiatric/Behavioral:  Negative for depression. The patient is not nervous/anxious.       PHYSICAL EXAMINATION  ECOG PERFORMANCE STATUS: 1 - Symptomatic but completely ambulatory  Vitals:   03/11/22 1215  BP: (!) 107/93  Pulse: 86  Resp: 16  Temp: 97.9 F (36.6 C)  SpO2: 100%    Physical Exam Constitutional:      General: She is not in acute distress.    Appearance: Normal appearance. She is not toxic-appearing.  HENT:     Head: Normocephalic and atraumatic.  Eyes:     General: No scleral icterus. Cardiovascular:      Rate and Rhythm: Normal rate and regular rhythm.     Pulses: Normal pulses.     Heart sounds: Normal heart sounds.  Pulmonary:     Effort: Pulmonary effort is normal.     Breath sounds: Normal breath sounds.  Abdominal:     General: Abdomen is flat. Bowel sounds are normal. There is no distension.     Palpations: Abdomen is soft.     Tenderness: There is no abdominal tenderness.  Musculoskeletal:        General: No swelling.     Cervical back: Neck supple.  Lymphadenopathy:     Cervical: No cervical adenopathy.  Skin:    General: Skin is warm and dry.     Findings: No rash.  Neurological:     General: No focal deficit present.     Mental Status: She is alert.  Psychiatric:        Mood and Affect: Mood normal.        Behavior: Behavior normal.     LABORATORY DATA:  CBC    Component Value Date/Time   WBC 10.6 (H) 02/26/2022 1413   WBC 6.9 08/16/2021 2045   RBC 4.28 02/26/2022 1413   HGB 12.5 02/26/2022 1413   HGB 12.2 08/10/2021 1948   HGB 9.6 (L) 12/11/2016 1017   HCT 38.7 02/26/2022 1413   HCT 37.8 08/10/2021 1948   HCT 28.5 (L) 12/11/2016 1017   PLT 332 02/26/2022 1413   PLT 395 08/10/2021 1948   MCV 90.4 02/26/2022 1413   MCV 91 08/10/2021 1948   MCV 99.5 12/11/2016 1017   MCH 29.2 02/26/2022 1413   MCHC 32.3 02/26/2022 1413   RDW 14.1 02/26/2022 1413   RDW  13.1 08/10/2021 1948   RDW 19.8 (H) 12/11/2016 1017   LYMPHSABS 2.0 02/26/2022 1413   LYMPHSABS 1.5 12/11/2016 1017   MONOABS 0.8 02/26/2022 1413   MONOABS 0.5 12/11/2016 1017   EOSABS 0.1 02/26/2022 1413   EOSABS 0.0 12/11/2016 1017   BASOSABS 0.0 02/26/2022 1413   BASOSABS 0.0 12/11/2016 1017    CMP     Component Value Date/Time   NA 144 02/26/2022 1413   NA 140 08/10/2021 1948   NA 139 12/11/2016 1017   K 4.4 02/26/2022 1413   K 3.7 12/11/2016 1017   CL 109 02/26/2022 1413   CL 104 10/09/2011 1403   CO2 23 02/26/2022 1413   CO2 22 12/11/2016 1017   GLUCOSE 92 02/26/2022 1413   GLUCOSE  122 12/11/2016 1017   GLUCOSE 113 (H) 10/09/2011 1403   BUN 22 02/26/2022 1413   BUN 21 08/10/2021 1948   BUN 16.9 12/11/2016 1017   CREATININE 1.45 (H) 02/26/2022 1413   CREATININE 1.74 (H) 10/20/2019 1634   CREATININE 1.2 (H) 12/11/2016 1017   CALCIUM 10.5 (H) 02/26/2022 1413   CALCIUM 9.0 12/11/2016 1017   PROT 7.4 02/26/2022 1413   PROT 6.9 08/10/2021 1948   PROT 7.4 12/11/2016 1017   ALBUMIN 4.1 02/26/2022 1413   ALBUMIN 4.1 08/10/2021 1948   ALBUMIN 4.0 12/11/2016 1017   AST 21 02/26/2022 1413   AST 65 (H) 12/11/2016 1017   ALT 13 02/26/2022 1413   ALT 51 12/11/2016 1017   ALKPHOS 114 02/26/2022 1413   ALKPHOS 139 12/11/2016 1017   BILITOT 0.6 02/26/2022 1413   BILITOT 0.42 12/11/2016 1017   GFRNONAA 36 (L) 02/26/2022 1413   GFRNONAA 27 (L) 10/20/2019 1634   GFRAA 32 (L) 10/20/2019 1634        ASSESSMENT and THERAPY PLAN:   Malignant neoplasm of upper-outer quadrant of left breast in female, estrogen receptor positive (H[facility]) Virgina is an 82 year old woman with stage Ib breast cancer here today for follow-up after undergoing scans.  Reviewed her CT chest abdomen pelvis results with her in detail which indicate no evidence of breast cancer recurrence in the chest abdomen or pelvis.  This is good news.  I did review the incidental findings including diverticulosis and aortic atherosclerosis.  Her aortic atherosclerosis is being managed by her primary care provider.  We discussed her voice change in the medication in addition by Dr. Alain Marion of Protonix.  I reviewed with her how Protonix works and how will help and recommended that she go ahead and pick it up and start taking it prior to her GI appointment that has not yet been scheduled but the referral is in process.  Lucero verbalized understanding of the above and already has an appointment to see [place] in follow-up in July 2024.  She knows to call in the interim for any questions or concerns.   All questions were  answered. The patient knows to call the clinic with any problems, questions or concerns. We can certainly see the patient much sooner if necessary.  Total encounter time: 20 minutes*in face-to-face visit time, chart review, lab review, care coordination, order entry, and documentation of the encounter time.    Wilber Bihari, NP 03/11/22 1:40 PM Medical Oncology and Hematology [facility] [place], [place] [address] Tel. [phone]    Fax. [phone]  *Total Encounter Time as defined by the Centers for Medicare and Medicaid Services includes, in addition to the face-to-face time of a patient  visit (documented in the note above) non-face-to-face time: obtaining and reviewing outside history, ordering and reviewing medications, tests or procedures, care coordination (communications with other health care professionals or caregivers) and documentation in the medical record.

## 2022-03-12 NOTE — Telephone Encounter (Signed)
Okay.  Thanks.

## 2022-03-13 ENCOUNTER — Ambulatory Visit: Payer: Medicare Other | Admitting: Physical Therapy

## 2022-03-13 NOTE — Therapy (Deleted)
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)   HTN (hypertension)    Hyperlipidemia    Hypothyroidism    Personal history of chemotherapy 2002   Personal history of radiation therapy 2002   Psoriasis    S/P thyroidectomy 07/2005   2 cm largest diameter (1 other tiny focus)/ i-131 rx 99 [facility] 08/2005   Thyroid cancer ([place])    Papillary Stage 1 - Dr Loanne Drilling   Vitamin B12 deficiency 2009   Vitamin D deficiency 2009   Past Surgical History:  Procedure Laterality Date   BREAST LUMPECTOMY     BREAST LUMPECTOMY WITH RADIOACTIVE SEED AND SENTINEL LYMPH NODE BIOPSY Left 09/11/2016   Procedure: LEFT BREAST LUMPECTOMY WITH RADIOACTIVE SEED AND LEFT AXILLARY SENTINEL LYMPH NODE BIOPSY WITH BLUE DYE INJECTION;  Surgeon: [name], MD;  Location: [facility];  Service: General;  Laterality: Left;   IR FLUORO GUIDE PORT INSERTION RIGHT  09/13/2016   IR REMOVAL TUN ACCESS W/ PORT W/O FL MOD SED  12/30/2016   IR US GUIDE VASC ACCESS RIGHT  09/13/2016   MASTECTOMY Right    Right   PORTACATH PLACEMENT N/A 09/11/2016   Procedure: ATTEMPTED INSERTION PORT-A-CATH WITH ULTRA SOUND GUIDANCE;  Surgeon: [name], MD;  Location: [facility];  Service: General;  Laterality: N/A;   REDUCTION MAMMAPLASTY Left 2005   THYROIDECTOMY  2007   Patient Active Problem List   Diagnosis Date Noted   GERD (gastroesophageal reflux disease) 03/05/2022   Dysphagia 03/05/2022   Low back pain 12/04/2021   Low back problem 12/04/2021   Gait  disorder 12/04/2021   Palpitations 02/19/2021   DOE (dyspnea on exertion) 11/16/2020   Chronic sinusitis 11/16/2020   Upper respiratory infection 09/28/2020   Retinal artery occlusion, branch, right 08/09/2020   History of TIA (transient ischemic attack) 08/09/2020   Hyperparathyroidism ([place]) 06/13/2020   Skin lesion 03/21/2020   Cerebrovascular disease 03/21/2020   Pruritus 03/14/2020   Ataxia 07/20/2019   Seroma of breast 07/20/2019   Cerumen impaction 03/23/2019   Wrist pain 03/23/2019   Right ear pain 06/27/2018   Coronary artery disease 01/20/2018   Breast cyst, left 09/17/2017   Aortic atherosclerosis ([place]) 07/28/2017   Osteoporosis 05/07/2017   Genetic testing 12/05/2016   Family history of breast cancer    Port catheter in place 10/23/2016   Encounter for antineoplastic chemotherapy 10/23/2016   Malignant neoplasm of upper-outer quadrant of left breast in female, estrogen receptor positive ([place]) 07/29/2016   Well adult exam 06/05/2015   Neoplasm of uncertain behavior of skin 06/05/2015   Adenopathy, cervical 06/05/2015   Sinus tachycardia 06/07/2014   Wart viral 10/18/2011   Hyperglycemia 11/06/2010   Actinic keratoses 05/25/2010   Essential hypertension 02/23/2010   SHOULDER PAIN 11/22/2009   Chronic fatigue 11/08/2009   RLQ PAIN 11/08/2009   VERTIGO 07/11/2009   ECZEMA 10/19/2008   CT, CHEST, ABNORMAL 05/30/2008   STYE 02/17/2008   B12 deficiency 12/16/2007   Vitamin D deficiency 12/16/2007   HYPOTHYROIDISM,  POSTSURGICAL 12/01/2007   Acute maxillary sinusitis 11/11/2007   ALLERGIC RESPIRATORY DISEASE, EXTRINSIC 11/11/2007   THYROID CANCER 08/19/2007   Dyslipidemia 08/19/2007   Anxiety disorder 08/19/2007   Situational depression 08/19/2007   Allergic rhinitis 08/19/2007    REFERRING DIAG: ***  THERAPY DIAG:  No diagnosis found.  Rationale for Evaluation and Treatment {HABREHAB:27488}  PERTINENT HISTORY: ***  PRECAUTIONS: ***  SUBJECTIVE:                                                                                                                                                                                       SUBJECTIVE STATEMENT:  ***   PAIN:  Are you having pain? {OPRCPAIN:27236}   OBJECTIVE: (objective measures completed at initial evaluation unless otherwise dated)  DIAGNOSTIC FINDINGS:  None recent   PATIENT SURVEYS:  FOTO back pain 61%   SCREENING FOR RED FLAGS: Bowel or bladder incontinence: No Spinal tumors: No Cauda equina syndrome: No Compression fracture: No Abdominal aneurysm: No   COGNITION: Overall cognitive status: History of cognitive impairments - at baseline                              Patient reports her memory is declining short-term recall and processing SENSATION: WFL   MUSCLE LENGTH: NT    POSTURE: rounded shoulders, forward head, increased thoracic kyphosis, posterior pelvic tilt, right pelvic obliquity, and flexed trunk    PALPATION: TTP mid back , post scapular    LUMBAR ROM:    AROM eval  Flexion WFL   Extension WFL guarded for balance   Right lateral flexion    Left lateral flexion    Right rotation WNL  Left rotation WNL    (Blank rows = not tested)   LOWER EXTREMITY ROM:    NT on eval  Passive  Right eval Left eval  Hip flexion      Hip extension      Hip abduction      Hip adduction      Hip internal rotation      Hip external rotation      Knee flexion      Knee extension      Ankle dorsiflexion      Ankle plantarflexion      Ankle inversion      Ankle eversion       (Blank rows = not tested)   LOWER EXTREMITY MMT:                         WFL in sitting    MMT Right  eval Left eval  Hip flexion      Hip extension      Hip abduction      Hip adduction      Hip internal rotation      Hip external rotation      Knee flexion      Knee extension      Ankle dorsiflexion      Ankle plantarflexion      Ankle inversion      Ankle eversion        (Blank rows = not tested)   LUMBAR SPECIAL TESTS:  NT    FUNCTIONAL TESTS:  5 times sit to stand: 28 sec, leans posteriorly, uses knees against the chair , min A  30 seconds chair stand test Timed up and go (TUG): NT on eval  Berg Balance Scale: 42/56     BERG BALANCE   Sitting to Standing: Numbers; 0-4: 4            4. Stands without using hands and stabilize independently            3. Stands independently using hands            2. Stands using hands after multiple trials            1. Min A to stand            0. Mod-Max A to stand Standing unsupported: Numbers; 0-4: 4            4. Stands safely for 2 minutes            3. Stands 2 minutes with supervision            2. Stands 30 seconds unsupported            1. Needs several tries to stand unsupported for 30 seconds            0. Unable to stand unsupported for 30 seconds Sitting unsupported: Numbers; 0-4: 4            4. Sits for 2 minutes independently            3. Sits for 2 minutes with supervision            2. Able to sit 30 seconds            1. Able to sit 10 seconds            0. Unable to sit for 10 seconds Standing to Sitting: Numbers; 0-4: 2 4. Sits safely with minimal use of hands 3. Controls descent with hands 2. Uses back of legs against chair to control descent 1. Sits independently, but uncontrolled descent 0. Needs assistance Transfers: Numbers; 0-4: 3            4. Transfers safely with minor use of hands            3. Transfers safely definite use of hands            2. Transfers with verbal cueing/supervision            1. Needs 1 person assist            0. Needs 2 person assist  Standing with eyes closed: Numbers; 0-4: 4            4. Stands safely for 10 seconds            3. Stands 10 seconds with  supervision             2. Able to stand for 3 seconds            1. Unable to keep eyes closed for 3 seconds, but is safe            0. Needs assist to keep from falling Standing with  feet together: Numbers; 0-4: 3 4. Stands for 1 minute safely 3. Stands for 1 minute with supervision 2. Unable to hold for 30 seconds            1. Needs help to attain position but can hold for 15 seconds            0. Needs help to attain position and unable to hold for 15 seconds Reaching forward with outstretched arm: Numbers; 0-4: 3            4. Reaches forward 10 inches            3. Reaches forward 5 inches            2. Reaches forward 2 inches            1. Reaches forward with supervision            0. Loses balance/requires assistace Retrieving object from the floor: Numbers; 0-4: 3 4. Able to pick up easily and safely 3. Able to pick up with supervision 2. Unable to pick up, but reaches within 1-2 inches independently 1. Unable to pick up and needs supervision 0. Unable/needs assistance to keep from falling  Turning to look behind: Numbers; 0-4: 4            4. Looks behind from both sides and weight shifts well            3. Looks behind one side only, other side less weight shift            2. Turns sideways only, maintains balance            1. Needs supervision when turning            0. Needs assistance  Turning 360 degrees: Numbers; 0-4: 1            4. Able to turn in </=4 seconds            3. Able to turn on one side in </= 4 seconds             2. Able to turn slowly, but safely            1. Needs supervision or verbal cueing            0. Needs assistance Place alternate foot on stool: Numbers; 0-4: 2 4. Completes 8 steps in 20 seconds 3. Completes 8 steps in >20 seconds 2. 4 steps without assistance/supervision 1. Completes >2 steps with minimal assist 0. Unable, needs assist to keep from falling Standing with one foot in front: Numbers; 0-4: 2            4. Independent tandem for 30 seconds            3. Independent foot ahead for 30 seconds            2. Independent small step for 30 seconds            1. Needs help to step, but can hold for 15  seconds  0. Loses balance while standing/stepping Standing on one foot: Numbers; 0-4: 3 4. Holds >10 seconds 3. Holds 5-10 seconds 2. Holds >/=3 seconds  1. Holds <3 seconds 0. Unable    Total Score: 42/56  GAIT: Distance walked: 180 Assistive device utilized: None Level of assistance: Min A Comments: falls to the L with gait in busy gym environment Dynamic gait: Head turns horizontal and vertical min A needed . Easily distracted.  Did not complete test due to time.      TODAY'S TREATMENT:                                                                                                                              DATE: 02/27/22  PT eval.  HEP initiated.  Dynamic gait assessment initiated. Berg balance test.    PATIENT EDUCATION:  Education details: PT, HEP, POC Person educated: Patient Education method: Theatre stage manager Education comprehension: verbalized understanding and needs further education   HOME EXERCISE PROGRAM: Access Code: [email or web address] URL: [email or web address] Date: 02/27/2022 Prepared by: Raeford Razor   Exercises - Supine Lower Trunk Rotation  - 1 x daily - 7 x weekly - 2 sets - 10 reps - 10 hold - Supine Bridge  - 1 x daily - 7 x weekly - 2 sets - 10 reps - 5 hold - Seated Shoulder Horizontal Abduction with Resistance  - 1 x daily - 7 x weekly - 2 sets - 10 reps - 5 hold   ASSESSMENT:   CLINICAL IMPRESSION: Patient is a 82 y.o. female who was seen today for physical therapy evaluation and treatment for mid back pain as well as balance deficits. Her main concern is balance and this will be incorporated into her back and postural exercises.     OBJECTIVE IMPAIRMENTS: Abnormal gait, decreased balance, decreased cognition, decreased coordination, decreased endurance, decreased knowledge of use of DME, decreased mobility, difficulty walking, decreased strength, dizziness, increased fascial restrictions, postural dysfunction, and pain.     ACTIVITY LIMITATIONS: carrying, lifting, bending, sitting, standing, squatting, stairs, transfers, bed mobility, and locomotion level   PARTICIPATION LIMITATIONS: community activity and yard work   PERSONAL FACTORS: Time since onset of injury/illness/exacerbation and 3+ comorbidities: vertigo, cancer, anxiety  are also affecting patient's functional outcome.    REHAB POTENTIAL: Good   CLINICAL DECISION MAKING: Evolving/moderate complexity   EVALUATION COMPLEXITY: Moderate     GOALS: Goals reviewed with patient? No   SHORT TERM GOALS: Target date: 03/27/2022       Pt will be able to show independence with HEP for posture, balance  Baseline: Goal status: INITIAL   2.  Pt will agree to try walking with a cane to improve her balance  Baseline:  Goal status: INITIAL   3.  Pt will be able to show improved stability with sit to stand  Baseline:  Goal status: INITIAL     LONG TERM GOALS: Target date: 04/24/2022   Pt will be able  to improve postural awareness with ADLs to reduce strain on neck and low back  Baseline:  Goal status: INITIAL   2.  Pt will be able to improve FOTO score to 48/56 to reduce fall risk.  Baseline: 42/56 Goal status: INITIAL   3.  Pt will be able to show independence with HEP and consistency for long term effect.  Baseline:  Goal status: INITIAL   4.  Pt will be able to show improved gait stability in busy environment, without sway to L consistently > 300 feet with LRAD Baseline:  Goal status: INITIAL   5.  Further goals related to balance TBA  Baseline:  Goal status: INITIAL     PLAN:   PT FREQUENCY: 2x/week   PT DURATION: 8 weeks   PLANNED INTERVENTIONS: Therapeutic exercises, Therapeutic activity, Neuromuscular re-education, Balance training, Gait training, Patient/Family education, Self Care, Joint mobilization, Cryotherapy, Moist heat, Manual therapy, and Re-evaluation.   PLAN FOR NEXT SESSION: Nustep for endurance, check HEP, TUG,  balance    Raeford Razor, PT 02/27/22 3:39 PM Phone: [phone] Fax: [phone]    Curlie Sittner, PT 02/27/2022, 3:13 PM   (Copy Eval's Objective through Plan section here)   Donovon Micheletti, PT 03/13/2022, 7:57 AM

## 2022-03-18 ENCOUNTER — Ambulatory Visit: Payer: Medicare Other | Admitting: Physical Therapy

## 2022-03-20 ENCOUNTER — Ambulatory Visit: Payer: Medicare Other | Admitting: Physical Therapy

## 2022-03-21 ENCOUNTER — Ambulatory Visit: Payer: Medicare Other | Attending: Cardiovascular Disease | Admitting: Cardiology

## 2022-03-21 VITALS — BP 116/80 | HR 69 | Ht 64.0 in | Wt 136.6 lb

## 2022-03-21 DIAGNOSIS — R072 Precordial pain: Secondary | ICD-10-CM

## 2022-03-21 DIAGNOSIS — I25119 Atherosclerotic heart disease of native coronary artery with unspecified angina pectoris: Secondary | ICD-10-CM

## 2022-03-21 MED ORDER — METOPROLOL TARTRATE 50 MG PO TABS: 50.0000 mg | ORAL_TABLET | ORAL | 0 refills | Status: DC

## 2022-03-21 NOTE — Progress Notes (Signed)
Cardiology Office Note:    Date:  03/22/2022   ID:  [name] [name], DOB 05-Aug-1940, MRN [id number]  PCP:  [name], MD   [place] Providers Cardiologist:  None     Referring MD: [name], MD    History of Present Illness:    [name] [name] is a 82 y.o. female here for the evaluation of coronary artery disease at the request of Dr. Alain Marion.   CT of chest abdomen pelvis personally reviewed from 03/07/2022 which revealed RCA as well as LAD calcification.  She has had some upper chest/shoulder discomfort with exertion, when playing golf for instance which she enjoys.  She has also had some recent heart ache from losing her sister who had her same birthday but they were not twins.  She also has another sister that was just diagnosed with non-Hodgkin's lymphoma.    Past Medical History:  Diagnosis Date   Allergic rhinitis    Anxiety    Breast cancer ([place]) hx 2004   recurrent 2006 DR Magrinat   Family history of breast cancer    Genetic testing 12/05/2016   Multi-Cancer panel (83 genes) @ [facility] - Monoallelic mutation in [place] (carrier)   HTN (hypertension)    Hyperlipidemia    Hypothyroidism    Personal history of chemotherapy 2002   Personal history of radiation therapy 2002   Psoriasis    S/P thyroidectomy 07/2005   2 cm largest diameter (1 other tiny focus)/ i-131 rx 99 [facility] 08/2005   Thyroid cancer ([place])    Papillary Stage 1 - Dr Loanne Drilling   Vitamin B12 deficiency 2009   Vitamin D deficiency 2009    Past Surgical History:  Procedure Laterality Date   BREAST LUMPECTOMY     BREAST LUMPECTOMY WITH RADIOACTIVE SEED AND SENTINEL LYMPH NODE BIOPSY Left 09/11/2016   Procedure: LEFT BREAST LUMPECTOMY WITH RADIOACTIVE SEED AND LEFT AXILLARY SENTINEL LYMPH NODE BIOPSY WITH BLUE DYE INJECTION;  Surgeon: [name], MD;  Location: [facility];  Service: General;  Laterality: Left;   IR FLUORO GUIDE PORT INSERTION RIGHT   09/13/2016   IR REMOVAL TUN ACCESS W/ PORT W/O FL MOD SED  12/30/2016   IR US GUIDE VASC ACCESS RIGHT  09/13/2016   MASTECTOMY Right    Right   PORTACATH PLACEMENT N/A 09/11/2016   Procedure: ATTEMPTED INSERTION PORT-A-CATH WITH ULTRA SOUND GUIDANCE;  Surgeon: [name], MD;  Location: [facility];  Service: General;  Laterality: N/A;   REDUCTION MAMMAPLASTY Left 2005   THYROIDECTOMY  2007    Current Medications: Current Meds  Medication Sig   ALPRAZolam (XANAX) 1 MG tablet TAKE 1/2 TO 1 TABLET BY MOUTH 2 TIMES DAILY AS NEEDED FOR ANXIETY   anastrozole (ARIMIDEX) 1 MG tablet Take 1 tablet (1 mg total) by mouth daily.   cholecalciferol (VITAMIN D) 1000 units tablet Take 2 tablets (2,000 Units total) by mouth daily.   clopidogrel (PLAVIX) 75 MG tablet TAKE 1 TABLET (75 MG TOTAL) BY MOUTH DAILY FOR 21 DAYS.   levothyroxine (SYNTHROID) 112 MCG tablet Take 1 tablet (112 mcg total) by mouth daily.   liothyronine (CYTOMEL) 25 MCG tablet Take 0.5 tablets (12.5 mcg total) by mouth daily.   meclizine (ANTIVERT) 12.5 MG tablet Take 1 tablet (12.5 mg total) by mouth 3 (three) times daily as needed for dizziness.   metoprolol succinate (TOPROL-XL) 25 MG 24 hr tablet Take 0.5 tablets (12.5 mg total) by mouth at bedtime.  metoprolol tartrate (LOPRESSOR) 50 MG tablet Take 1 tablet (50 mg total) by mouth as directed. Take one tablet 2 hours before CT   pantoprazole (PROTONIX) 40 MG tablet Take 1 tablet (40 mg total) by mouth daily.   rosuvastatin (CRESTOR) 20 MG tablet Take 1 tablet (20 mg total) by mouth daily.   telmisartan (MICARDIS) 20 MG tablet Take 1 tablet (20 mg total) by mouth daily.   venlafaxine XR (EFFEXOR XR) 150 MG 24 hr capsule Take 1 capsule (150 mg total) by mouth daily with breakfast.   vitamin B-12 (CYANOCOBALAMIN) 1000 MCG tablet Take 1,000 mcg by mouth daily.     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  Tobacco Use   Smoking status: Never   Smokeless tobacco: Never  Substance and Sexual Activity   Alcohol use: No   Drug use: No   Sexual activity: Not Currently  Other Topics Concern   Not on file  Social History Narrative   GI - Dr Earlean Shawl   Depression - Dr Toy Care   GYN - Dr Marianna Payment      Social Determinants of Health   Financial Resource Strain: Low Risk  (06/18/2021)   Overall Financial Resource Strain (CARDIA)    Difficulty of Paying Living Expenses: Not hard at all  Food Insecurity: No Food Insecurity (06/18/2021)   Hunger Vital Sign    Worried About Running Out of Food in the Last Year: Never true    [place] in the Last Year: Never true  Transportation Needs: No Transportation Needs (06/18/2021)   PRAPARE - Hydrologist (Medical): No    Lack of Transportation (Non-Medical): No  Physical Activity: Sufficiently Active (06/18/2021)   Exercise Vital Sign    Days of Exercise per Week: 5 days    Minutes of Exercise per Session: 30 min  Stress: No Stress Concern Present (06/18/2021)   [place]    Feeling of Stress : Not at all  Social Connections: Moderately Integrated (06/18/2021)   Social Connection and Isolation Panel [NHANES]    Frequency of Communication with Friends and Family: Three times a week    Frequency of Social Gatherings with Friends and Family: Three times a week    Attends Religious Services: More than 4 times per year    Active Member of Clubs or Organizations: No    Attends Archivist  Meetings: Never    Marital Status: Married     Family History: The patient's family history includes Allergies in her daughter, mother, sister, and sister; Asthma in her brother, daughter, mother, and sister; Breast cancer (age of onset: 35) in her sister; Breast cancer (age of onset: 14) in her sister; Cancer in her paternal aunt; Clotting disorder in her mother; Heart disease (age of onset: 28) in her father; Hyperparathyroidism in her sister; Kidney cancer in her paternal aunt; Leukemia in her brother; Liver cancer in her paternal uncle; Lung cancer in her maternal grandfather; Lymphoma in her sister; Pancreatic cancer in her  paternal grandfather; Stroke in her mother; Throat cancer in her maternal grandmother.  ROS:   Please see the history of present illness.    No fevers chills nausea vomiting all other systems reviewed and are negative.  EKGs/Labs/Other Studies Reviewed:    The following studies were reviewed today: CT scan as above.  Office notes reviewed.  EKG: 03/21/2022-sinus rhythm 71 bpm  Recent Labs: 11/29/2021: TSH 0.22 02/26/2022: ALT 13; BUN 22; Creatinine 1.45; Hemoglobin 12.5; Platelet Count 332; Potassium 4.4; Sodium 144  Recent Lipid Panel    Component Value Date/Time   CHOL 134 08/10/2020 0430   TRIG 263 (H) 08/10/2020 0430   HDL 37 (L) 08/10/2020 0430   CHOLHDL 3.6 08/10/2020 0430   VLDL 53 (H) 08/10/2020 0430   LDLCALC 44 08/10/2020 0430   LDLCALC  10/20/2019 1634     Comment:     . LDL cholesterol not calculated. Triglyceride levels greater than 400 mg/dL invalidate calculated LDL results. . Reference range: <100 . Desirable range <100 mg/dL for primary prevention;   <70 mg/dL for patients with CHD or diabetic patients  with > or = 2 CHD risk factors. [name] LDL-C is now calculated using the Martin-Hopkins  calculation, which is a validated novel method providing  better accuracy than the Friedewald equation in the  estimation of LDL-C.  Cresenciano Genre et al.  Annamaria Helling. MU:[id number]): 2061-2068  (http://education.Quest[employer]ostics.com/faq/FAQ164)    LDLDIRECT 125.0 06/05/2015 1559     Risk Assessment/Calculations:              Physical Exam:    VS:  BP 116/80   Pulse 69   Ht 5' 4"$  (1.626 m)   Wt 136 lb 9.6 oz (62 kg)   SpO2 98%   BMI 23.45 kg/m     Wt Readings from Last 3 Encounters:  03/21/22 136 lb 9.6 oz (62 kg)  03/11/22 135 lb (61.2 kg)  03/05/22 135 lb 12.8 oz (61.6 kg)     GEN:  Well nourished, well developed in no acute distress HEENT: Normal NECK: No JVD; No carotid bruits LYMPHATICS: No lymphadenopathy CARDIAC: RRR, no murmurs, rubs, gallops RESPIRATORY:  Clear to auscultation without rales, wheezing or rhonchi  ABDOMEN: Soft, non-tender, non-distended MUSCULOSKELETAL:  No edema; No deformity  SKIN: Warm and dry NEUROLOGIC:  Alert and oriented x 3 PSYCHIATRIC:  Normal affect   ASSESSMENT:    1. Coronary artery disease involving native heart with angina pectoris, unspecified vessel or lesion type ([place])   2. Precordial pain    PLAN:    In order of problems listed above:  Coronary artery disease with angina  -Coronary plaque noted on recent CT scan.  We will go ahead and check a coronary CT to focus on the lumen of the arteries.  We went to make sure that she does not have any significant stenosis present.  She does have occasional discomfort in her upper chest and shoulders bilaterally with exertion. -Continue with Crestor 20 mg.  LDL 40s.  Excellent.  Continue with Plavix 75 mg.  She has been taking this for quite some time.  Hyperlipidemia - Crestor 20.  LDL excellent 44           Medication Adjustments/Labs and Tests Ordered: Current medicines are reviewed at length with the patient today.  Concerns regarding medicines are outlined above.  Orders Placed This Encounter  Procedures   CT CORONARY MORPH W/CTA COR W/SCORE W/CA W/CM &/OR WO/CM   EKG 12-Lead   Meds ordered this  encounter  Medications    metoprolol tartrate (LOPRESSOR) 50 MG tablet    Sig: Take 1 tablet (50 mg total) by mouth as directed. Take one tablet 2 hours before CT    Dispense:  1 tablet    Refill:  0    Patient Instructions  Medication Instructions:  The current medical regimen is effective;  continue present plan and medications.  *If you need a refill on your cardiac medications before your next appointment, please call your pharmacy*   Lab Work: None today If you have labs (blood work) drawn today and your tests are completely normal, you will receive your results only by: [place] (if you have MyChart) OR A paper copy in the mail If you have any lab test that is abnormal or we need to change your treatment, we will call you to review the results.   Testing/Procedures:   Your cardiac CT will be scheduled at:   [facility] [address] [place], [place] [address] [phone]  Please arrive at the [facility] and Children's Entrance (Entrance C2) of [facility] 30 minutes prior to test start time. You can use the FREE valet parking offered at entrance C (encouraged to control the heart rate for the test)  Proceed to the [facility] Radiology Department (first floor) to check-in and test prep.  All radiology patients and guests should use entrance C2 at [facility], accessed from [facility], even though the hospital's physical address listed is [address]    Please follow these instructions carefully (unless otherwise directed):  On the Night Before the Test: Be sure to Drink plenty of water. Do not consume any caffeinated/decaffeinated beverages or chocolate 12 hours prior to your test. Do not take any antihistamines 12 hours prior to your test.  On the Day of the Test: Drink plenty of water until 1 hour prior to the test. Do not eat any food 1 hour prior to test. You may take your regular medications prior to the test.  Take  metoprolol (Lopressor) two hours prior to test. If you take Furosemide/Hydrochlorothiazide/Spironolactone, please HOLD on the morning of the test. FEMALES- please wear underwire-free bra if available, avoid dresses & tight clothing      After the Test: Drink plenty of water. After receiving IV contrast, you may experience a mild flushed feeling. This is normal. On occasion, you may experience a mild rash up to 24 hours after the test. This is not dangerous. If this occurs, you can take Benadryl 25 mg and increase your fluid intake. If you experience trouble breathing, this can be serious. If it is severe call 911 IMMEDIATELY. If it is mild, please call our office. If you take any of these medications: Glipizide/Metformin, Avandament, Glucavance, please do not take 48 hours after completing test unless otherwise instructed.  We will call to schedule your test 2-4 weeks out understanding that some insurance companies will need an authorization prior to the service being performed.   For non-scheduling related questions, please contact the cardiac imaging nurse navigator should you have any questions/concerns: Marchia Bond, Cardiac Imaging Nurse Navigator Gordy Clement, Cardiac Imaging Nurse Navigator [facility] Direct Office Dial: [phone]   For scheduling needs, including cancellations and rescheduling, please call [place], [phone].  Follow-Up: At [facility], you and your health needs are our priority.  As part of our continuing mission to provide you with exceptional heart care, we have created  designated Provider Care Teams.  These Care Teams include your primary Cardiologist (physician) and Advanced Practice Providers (APPs -  Physician Assistants and Nurse Practitioners) who all work together to provide you with the care you need, when you need it.  We recommend signing up for the patient portal called "MyChart".  Sign up information is  provided on this After Visit Summary.  MyChart is used to connect with patients for Virtual Visits (Telemedicine).  Patients are able to view lab/test results, encounter notes, upcoming appointments, etc.  Non-urgent messages can be sent to your provider as well.   To learn more about what you can do with MyChart, go to [email or web address].    Your next appointment:   1 year(s)  Provider:   Dr Candee Furbish       Signed, Candee Furbish, MD  03/22/2022 10:38 AM    [place]

## 2022-03-21 NOTE — Patient Instructions (Signed)
Medication Instructions:  The current medical regimen is effective;  continue present plan and medications.  *If you need a refill on your cardiac medications before your next appointment, please call your pharmacy*   Lab Work: None today If you have labs (blood work) drawn today and your tests are completely normal, you will receive your results only by: [place] (if you have MyChart) OR A paper copy in the mail If you have any lab test that is abnormal or we need to change your treatment, we will call you to review the results.   Testing/Procedures:   Your cardiac CT will be scheduled at:   [facility] [address] [place], [place] [address] [phone]  Please arrive at the [facility] and Children's Entrance (Entrance C2) of [facility] 30 minutes prior to test start time. You can use the FREE valet parking offered at entrance C (encouraged to control the heart rate for the test)  Proceed to the [facility] Radiology Department (first floor) to check-in and test prep.  All radiology patients and guests should use entrance C2 at [facility], accessed from [facility], even though the hospital's physical address listed is [address]    Please follow these instructions carefully (unless otherwise directed):  On the Night Before the Test: Be sure to Drink plenty of water. Do not consume any caffeinated/decaffeinated beverages or chocolate 12 hours prior to your test. Do not take any antihistamines 12 hours prior to your test.  On the Day of the Test: Drink plenty of water until 1 hour prior to the test. Do not eat any food 1 hour prior to test. You may take your regular medications prior to the test.  Take metoprolol (Lopressor) two hours prior to test. If you take Furosemide/Hydrochlorothiazide/Spironolactone, please HOLD on the morning of the test. FEMALES- please wear underwire-free bra if available, avoid  dresses & tight clothing      After the Test: Drink plenty of water. After receiving IV contrast, you may experience a mild flushed feeling. This is normal. On occasion, you may experience a mild rash up to 24 hours after the test. This is not dangerous. If this occurs, you can take Benadryl 25 mg and increase your fluid intake. If you experience trouble breathing, this can be serious. If it is severe call 911 IMMEDIATELY. If it is mild, please call our office. If you take any of these medications: Glipizide/Metformin, Avandament, Glucavance, please do not take 48 hours after completing test unless otherwise instructed.  We will call to schedule your test 2-4 weeks out understanding that some insurance companies will need an authorization prior to the service being performed.   For non-scheduling related questions, please contact the cardiac imaging nurse navigator should you have any questions/concerns: Marchia Bond, Cardiac Imaging Nurse Navigator Gordy Clement, Cardiac Imaging Nurse Navigator [facility] Direct Office Dial: [phone]   For scheduling needs, including cancellations and rescheduling, please call [place], [phone].  Follow-Up: At [facility], you and your health needs are our priority.  As part of our continuing mission to provide you with exceptional heart care, we have created designated Provider Care Teams.  These Care Teams include your primary Cardiologist (physician) and Advanced Practice Providers (APPs -  Physician Assistants and Nurse Practitioners) who all work together to provide you with the care you need, when you need it.  We recommend signing up for the patient portal called "MyChart".  Sign up information is provided on this After Visit Summary.  MyChart is used to connect with patients for Virtual Visits (Telemedicine).  Patients are able to view lab/test results, encounter notes, upcoming appointments, etc.   Non-urgent messages can be sent to your provider as well.   To learn more about what you can do with MyChart, go to [email or web address].    Your next appointment:   1 year(s)  Provider:   Dr Candee Furbish

## 2022-03-25 ENCOUNTER — Ambulatory Visit: Payer: Medicare Other | Admitting: Physical Therapy

## 2022-03-27 ENCOUNTER — Ambulatory Visit: Payer: Medicare Other | Admitting: Physical Therapy

## 2022-03-29 ENCOUNTER — Telehealth (HOSPITAL_COMMUNITY): Payer: Self-pay | Admitting: *Deleted

## 2022-03-29 ENCOUNTER — Telehealth: Payer: Self-pay | Admitting: Cardiology

## 2022-03-29 NOTE — Telephone Encounter (Signed)
Pt is requesting return call to see if reading glasses were left behind during her last visit 02/08. Requesting return call.

## 2022-03-29 NOTE — Telephone Encounter (Signed)
Reaching out to patient to offer assistance regarding upcoming cardiac imaging study; pt verbalizes understanding of appt date/time. However she wishes to reschedule. New appointment made for February 26 at 2 PM. Patient is aware to arrive at 1:30 PM.  Gordy Clement RN Navigator Cardiac [facility] [phone] office [phone] cell

## 2022-03-29 NOTE — Telephone Encounter (Signed)
Message printed and taken to check in/lost and found are so that the patient can be contacted.

## 2022-04-01 ENCOUNTER — Ambulatory Visit (HOSPITAL_COMMUNITY): Admission: RE | Admit: 2022-04-01 | Payer: Medicare Other | Source: Ambulatory Visit

## 2022-04-02 ENCOUNTER — Ambulatory Visit: Payer: Medicare Other | Admitting: Cardiovascular Disease

## 2022-04-05 ENCOUNTER — Telehealth (HOSPITAL_COMMUNITY): Payer: Self-pay | Admitting: *Deleted

## 2022-04-05 ENCOUNTER — Telehealth (HOSPITAL_COMMUNITY): Payer: Self-pay | Admitting: Emergency Medicine

## 2022-04-05 ENCOUNTER — Other Ambulatory Visit (HOSPITAL_COMMUNITY): Payer: Self-pay | Admitting: *Deleted

## 2022-04-05 MED ORDER — METOPROLOL TARTRATE 50 MG PO TABS: 50.0000 mg | ORAL_TABLET | ORAL | 0 refills | Status: DC

## 2022-04-05 NOTE — Telephone Encounter (Signed)
No reading glasses found at front desk in lost and found.

## 2022-04-05 NOTE — Telephone Encounter (Signed)
Reaching out to patient to offer assistance regarding upcoming cardiac imaging study; pt verbalizes understanding of appt date/time, parking situation and where to check in, pre-test NPO status and medications ordered, and verified current allergies; name and call back number provided for further questions should they arise  Gordy Clement RN Navigator Cardiac Imaging [facility] [phone] office [phone] cell  Patient to take '50mg'$  metoprolol tartrate two hours prior to her cardiac CT scan. She is aware to arrive at 1:30pm.

## 2022-04-05 NOTE — Telephone Encounter (Signed)
Attempted to call patient regarding upcoming cardiac CT appointment. °Left message on voicemail with name and callback number °Demarco Bacci RN Navigator Cardiac Imaging °[facility] °[phone] Office °[phone] Cell ° °

## 2022-04-08 ENCOUNTER — Ambulatory Visit (HOSPITAL_COMMUNITY): Admission: RE | Admit: 2022-04-08 | Payer: Medicare Other | Source: Ambulatory Visit

## 2022-04-19 ENCOUNTER — Telehealth (HOSPITAL_COMMUNITY): Payer: Self-pay | Admitting: *Deleted

## 2022-04-19 ENCOUNTER — Ambulatory Visit (HOSPITAL_COMMUNITY): Payer: Medicare Other

## 2022-04-19 NOTE — Telephone Encounter (Signed)
Attempted to call patient regarding upcoming cardiac CT appointment. ?Unable to leave a voicemail. ? ?Mohammad Granade RN Navigator Cardiac Imaging ?[facility] ?[phone] Office ?[phone] Cell ? ?

## 2022-04-20 ENCOUNTER — Encounter (HOSPITAL_COMMUNITY): Payer: Self-pay

## 2022-04-20 ENCOUNTER — Ambulatory Visit (HOSPITAL_COMMUNITY)
Admission: EM | Admit: 2022-04-20 | Discharge: 2022-04-20 | Disposition: A | Discharge status: Home / Self Care | Payer: Medicare Other | Attending: Physician Assistant | Admitting: Physician Assistant

## 2022-04-20 DIAGNOSIS — K047 Periapical abscess without sinus: Secondary | ICD-10-CM | POA: Diagnosis not present

## 2022-04-20 MED ORDER — AMOXICILLIN-POT CLAVULANATE 500-125 MG PO TABS: 1.0000 | ORAL_TABLET | Freq: Two times a day (BID) | ORAL | 0 refills | Status: DC

## 2022-04-20 NOTE — ED Triage Notes (Signed)
Patient reports that she had dental work completed >1 month ago. Patient states that she has a hard lump where she had a tooth placed. Patient states that when she touches it , the pain goes up into her sinus area.

## 2022-04-20 NOTE — Discharge Instructions (Signed)
We are treating you for a dental infection.  Start Augmentin twice daily for 7 days.  Take this with food as it can upset your stomach.  Please follow-up with dentist as soon as possible.  If you have any worsening symptoms including increased swelling/pain, fever, difficulty swallowing, shortness of breath, muffled voice you need to go to the emergency room immediately.

## 2022-04-20 NOTE — ED Provider Notes (Signed)
[place]    CSN: [id number] Arrival date & time: 04/20/22  1746      History   Chief Complaint No chief complaint on file.   HPI [name] [name] is a 82 y.o. female.   Patient presents today with a several day history of painful nodule in her left upper jaw.  She reports that approximately 4 to 6 weeks ago she had dental work done directly underneath this area.  She reports that when she was brushing her teeth the other days she noticed this lesion and has become slightly larger and more painful since onset.  Pain is only present with manipulation/palpation of the lesion.  During episodes pain is rated 7 on a 0-10 pain scale, described as throbbing/tenderness, no aggravating leaving factors identified.  She has not tried any over-the-counter medication for symptom management.  Denies any recent antibiotic use.  She did try to call her dentist who recommended that she be evaluated to consider antibiotics given her symptoms.  They are currently out of town but she does intend to schedule an appointment as soon as they return.  She denies any dysphagia, fever, nausea, vomiting, muffled voice, swelling of her throat, shortness of breath.    Past Medical History:  Diagnosis Date   Allergic rhinitis    Anxiety    Breast cancer ([place]) hx 2004   recurrent 2006 DR Magrinat   Family history of breast cancer    Genetic testing 12/05/2016   Multi-Cancer panel (83 genes) @ [facility] - Monoallelic mutation in [place] (carrier)   HTN (hypertension)    Hyperlipidemia    Hypothyroidism    Personal history of chemotherapy 2002   Personal history of radiation therapy 2002   Psoriasis    S/P thyroidectomy 07/2005   2 cm largest diameter (1 other tiny focus)/ i-131 rx 99 mci 08/2005   Thyroid cancer ([place])    Papillary Stage 1 - Dr Loanne Drilling   Vitamin B12 deficiency 2009   Vitamin D deficiency 2009    Patient Active Problem List   Diagnosis Date Noted   GERD (gastroesophageal reflux  disease) 03/05/2022   Dysphagia 03/05/2022   Low back pain 12/04/2021   Low back problem 12/04/2021   Gait disorder 12/04/2021   Palpitations 02/19/2021   DOE (dyspnea on exertion) 11/16/2020   Chronic sinusitis 11/16/2020   Upper respiratory infection 09/28/2020   Retinal artery occlusion, branch, right 08/09/2020   History of TIA (transient ischemic attack) 08/09/2020   Hyperparathyroidism ([place]) 06/13/2020   Skin lesion 03/21/2020   Cerebrovascular disease 03/21/2020   Pruritus 03/14/2020   Ataxia 07/20/2019   Seroma of breast 07/20/2019   Cerumen impaction 03/23/2019   Wrist pain 03/23/2019   Right ear pain 06/27/2018   Coronary artery disease 01/20/2018   Breast cyst, left 09/17/2017   Aortic atherosclerosis ([place]) 07/28/2017   Osteoporosis 05/07/2017   Genetic testing 12/05/2016   Family history of breast cancer    Port catheter in place 10/23/2016   Encounter for antineoplastic chemotherapy 10/23/2016   Malignant neoplasm of upper-outer quadrant of left breast in female, estrogen receptor positive ([place]) 07/29/2016   Well adult exam 06/05/2015   Neoplasm of uncertain behavior of skin 06/05/2015   Adenopathy, cervical 06/05/2015   Sinus tachycardia 06/07/2014   Wart viral 10/18/2011   Hyperglycemia 11/06/2010   Actinic keratoses 05/25/2010   Essential hypertension 02/23/2010   SHOULDER PAIN 11/22/2009   Chronic fatigue 11/08/2009   RLQ PAIN 11/08/2009   VERTIGO 07/11/2009  ECZEMA 10/19/2008   CT, CHEST, ABNORMAL 05/30/2008   STYE 02/17/2008   B12 deficiency 12/16/2007   Vitamin D deficiency 12/16/2007   HYPOTHYROIDISM, POSTSURGICAL 12/01/2007   Acute maxillary sinusitis 11/11/2007   ALLERGIC RESPIRATORY DISEASE, EXTRINSIC 11/11/2007   THYROID CANCER 08/19/2007   Dyslipidemia 08/19/2007   Anxiety disorder 08/19/2007   Situational depression 08/19/2007   Allergic rhinitis 08/19/2007    Past Surgical History:  Procedure Laterality Date   BREAST LUMPECTOMY      BREAST LUMPECTOMY WITH RADIOACTIVE SEED AND SENTINEL LYMPH NODE BIOPSY Left 09/11/2016   Procedure: LEFT BREAST LUMPECTOMY WITH RADIOACTIVE SEED AND LEFT AXILLARY SENTINEL LYMPH NODE BIOPSY WITH BLUE DYE INJECTION;  Surgeon: Claud Kelp, MD;  Location: [facility];  Service: General;  Laterality: Left;   IR FLUORO GUIDE PORT INSERTION RIGHT  09/13/2016   IR REMOVAL TUN ACCESS W/ PORT W/O FL MOD SED  12/30/2016   IR US GUIDE VASC ACCESS RIGHT  09/13/2016   MASTECTOMY Right    Right   PORTACATH PLACEMENT N/A 09/11/2016   Procedure: ATTEMPTED INSERTION PORT-A-CATH WITH ULTRA SOUND GUIDANCE;  Surgeon: Claud Kelp, MD;  Location: [facility];  Service: General;  Laterality: N/A;   REDUCTION MAMMAPLASTY Left 2005   THYROIDECTOMY  2007    OB History   No obstetric history on file.      Home Medications    Prior to Admission medications   Medication Sig Start Date End Date Taking? Authorizing Provider  ALPRAZolam (XANAX) 1 MG tablet TAKE 1/2 TO 1 TABLET BY MOUTH 2 TIMES DAILY AS NEEDED FOR ANXIETY 12/06/21   Plotnikov, Georgina Quint, MD  amoxicillin-clavulanate (AUGMENTIN) 500-125 MG tablet Take 1 tablet by mouth in the morning and at bedtime. 04/20/22   Audri Kozub, Noberto Retort, PA-C  anastrozole (ARIMIDEX) 1 MG tablet Take 1 tablet (1 mg total) by mouth daily. 02/26/22   Loa Socks, NP  cholecalciferol (VITAMIN D) 1000 units tablet Take 2 tablets (2,000 Units total) by mouth daily. 06/19/17   Plotnikov, Georgina Quint, MD  clopidogrel (PLAVIX) 75 MG tablet TAKE 1 TABLET (75 MG TOTAL) BY MOUTH DAILY FOR 21 DAYS. 11/05/21   Plotnikov, Georgina Quint, MD  guaiFENesin (MUCINEX) 600 MG 12 hr tablet Take 1 tablet (600 mg total) by mouth 2 (two) times daily. Patient not taking: Reported on 03/21/2022 08/27/21   Plotnikov, Georgina Quint, MD  levothyroxine (SYNTHROID) 112 MCG tablet Take 1 tablet (112 mcg total) by mouth daily. 01/31/22   Plotnikov, Georgina Quint, MD  liothyronine  (CYTOMEL) 25 MCG tablet Take 0.5 tablets (12.5 mcg total) by mouth daily. 11/15/21   Plotnikov, Georgina Quint, MD  meclizine (ANTIVERT) 12.5 MG tablet Take 1 tablet (12.5 mg total) by mouth 3 (three) times daily as needed for dizziness. 01/01/22 01/01/23  Plotnikov, Georgina Quint, MD  metoprolol succinate (TOPROL-XL) 25 MG 24 hr tablet Take 0.5 tablets (12.5 mg total) by mouth at bedtime. 11/15/21   Plotnikov, Georgina Quint, MD  metoprolol tartrate (LOPRESSOR) 50 MG tablet Take 1 tablet (50 mg total) by mouth as directed. Take one tablet 2 hours before CT 04/05/22   Jake Bathe, MD  pantoprazole (PROTONIX) 40 MG tablet Take 1 tablet (40 mg total) by mouth daily. 03/05/22   Plotnikov, Georgina Quint, MD  rosuvastatin (CRESTOR) 20 MG tablet Take 1 tablet (20 mg total) by mouth daily. 12/04/21   Plotnikov, Georgina Quint, MD  telmisartan (MICARDIS) 20 MG tablet Take 1 tablet (20 mg total) by mouth daily.  04/26/21   Plotnikov, Evie Lacks, MD  venlafaxine XR (EFFEXOR XR) 150 MG 24 hr capsule Take 1 capsule (150 mg total) by mouth daily with breakfast. 12/04/21   Plotnikov, Evie Lacks, MD  vitamin B-12 (CYANOCOBALAMIN) 1000 MCG tablet Take 1,000 mcg by mouth daily.    [provider]    Family History Family History  Problem Relation Age of Onset   Stroke Mother    Allergies Mother    Asthma Mother    Clotting disorder Mother    Heart disease Father 64       MI   Allergies Sister    Asthma Sister    Breast cancer Sister 59       Lymphoma 53; currently 51   Lymphoma Sister    Hyperparathyroidism Sister    Asthma Brother    Leukemia Brother        dx 25s   Allergies Daughter    Asthma Daughter    Allergies Sister    Breast cancer Sister 5       currently 94   Kidney cancer Paternal Aunt        kidney ca; deceased 65   Liver cancer Paternal Uncle        unk. primary ("liver")   Throat cancer Maternal Grandmother        deceased 22   Lung cancer Maternal Grandfather        deceased 41   Pancreatic  cancer Paternal Grandfather        deceased 29   Cancer Paternal Aunt        "abdominal"; deceased 35    Social History Social History   Tobacco Use   Smoking status: Never   Smokeless tobacco: Never  Vaping Use   Vaping Use: Never used  Substance Use Topics   Alcohol use: No   Drug use: No     Allergies   Pneumococcal vaccines, Sulfa antibiotics, Sulfacetamide sodium-sulfur, Sulfamethoxazole-trimethoprim, Tape, and Tegaderm ag mesh [silver]   Review of Systems Review of Systems  Constitutional:  Negative for activity change, appetite change, fatigue and fever.  HENT:  Positive for dental problem and facial swelling. Negative for congestion, sore throat, trouble swallowing and voice change.   Neurological:  Negative for dizziness, light-headedness and headaches.     Physical Exam Triage Vital Signs ED Triage Vitals [04/20/22 1839]  Enc Vitals Group     BP (!) 142/83     Pulse Rate 94     Resp 16     Temp 97.8 F (36.6 C)     Temp Source Oral     SpO2 97 %     Weight      Height      Head Circumference      Peak Flow      Pain Score      Pain Loc      Pain Edu?      Excl. in [place]?    No data found.  Updated Vital Signs BP (!) 142/83 (BP Location: Left Arm)   Pulse 94   Temp 97.8 F (36.6 C) (Oral)   Resp 16   SpO2 97%   Visual Acuity Right Eye Distance:   Left Eye Distance:   Bilateral Distance:    Right Eye Near:   Left Eye Near:    Bilateral Near:     Physical Exam Vitals reviewed.  Constitutional:      General: She is awake. She is not in acute  distress.    Appearance: Normal appearance. She is well-developed. She is not ill-appearing.     Comments: Very pleasant female appears stated age in no acute distress sitting comfortably in exam room  HENT:     Head: Normocephalic and atraumatic.     Right Ear: External ear normal.     Left Ear: External ear normal.     Mouth/Throat:     Dentition: Gingival swelling present.     Pharynx: Uvula  midline. No oropharyngeal exudate or posterior oropharyngeal erythema.     Comments: 2 cm x 2 cm mobile nodule that is tender to palpation noted above #14. Cardiovascular:     Rate and Rhythm: Normal rate and regular rhythm.     Heart sounds: Normal heart sounds, S1 normal and S2 normal. No murmur heard. Pulmonary:     Effort: Pulmonary effort is normal.     Breath sounds: Normal breath sounds. No wheezing, rhonchi or rales.     Comments: Clear to auscultation bilaterally Psychiatric:        Behavior: Behavior is cooperative.      UC Treatments / Results  Labs (all labs ordered are listed, but only abnormal results are displayed) Labs Reviewed - No data to display  EKG   Radiology No results found.  Procedures Procedures (including critical care time)  Medications Ordered in UC Medications - No data to display  Initial Impression / Assessment and Plan / UC Course  I have reviewed the triage vital signs and the nursing notes.  Pertinent labs & imaging results that were available during my care of the patient were reviewed by me and considered in my medical decision making (see chart for details).    Patient is well-appearing, afebrile, nontoxic, nontachycardic.  Will cover for dental infection with Augmentin 500/125 twice daily for 7 days.  Metabolic panel from 09/20/1749 showed creatinine of 1.45 with calculated creatinine clearance of 28.63 mL/min so lower dose of Augmentin was given based on renal dosing.  She can gargle with warm salt water and use over-the-counter analgesics as needed.  Discussed that given this lesion is well-demarcated I am not 100% convinced that it is an abscess and recommend that she follow-up with dentist for further evaluation and management.  She is agreeable and will call them to schedule an appointment soon as possible.  Discussed that if she has any worsening symptoms including fever, nausea, vomiting, enlarging lesion, dysphagia, muffled voice she  needs to be seen immediately.  Strict return precautions given.  Final Clinical Impressions(s) / UC Diagnoses   Final diagnoses:  Dental infection     Discharge Instructions      We are treating you for a dental infection.  Start Augmentin twice daily for 7 days.  Take this with food as it can upset your stomach.  Please follow-up with dentist as soon as possible.  If you have any worsening symptoms including increased swelling/pain, fever, difficulty swallowing, shortness of breath, muffled voice you need to go to the emergency room immediately.    ED Prescriptions     Medication Sig Dispense Auth. Provider   amoxicillin-clavulanate (AUGMENTIN) 500-125 MG tablet  (Status: Discontinued) Take 1 tablet by mouth in the morning and at bedtime. 14 tablet Gahel Safley K, PA-C   amoxicillin-clavulanate (AUGMENTIN) 500-125 MG tablet Take 1 tablet by mouth in the morning and at bedtime. 14 tablet Zoey Gilkeson, Derry Skill, PA-C      PDMP not reviewed this encounter.   Miyonna Ormiston, Derry Skill, PA-C  04/20/22 1931  

## 2022-04-22 ENCOUNTER — Ambulatory Visit (HOSPITAL_COMMUNITY)
Admission: RE | Admit: 2022-04-22 | Discharge: 2022-04-22 | Disposition: A | Discharge status: Home / Self Care | Payer: Medicare Other | Source: Ambulatory Visit | Attending: Cardiology | Admitting: Cardiology

## 2022-04-22 DIAGNOSIS — R072 Precordial pain: Secondary | ICD-10-CM | POA: Diagnosis not present

## 2022-04-22 MED ORDER — NITROGLYCERIN 0.4 MG SL SUBL
0.8000 mg | SUBLINGUAL_TABLET | Freq: Once | SUBLINGUAL | Status: AC
  Administered 2022-04-22: 0.8 mg via SUBLINGUAL

## 2022-04-22 MED ORDER — METOPROLOL TARTRATE 5 MG/5ML IV SOLN
5.0000 mg | Freq: Once | INTRAVENOUS | Status: AC
  Administered 2022-04-22: 5 mg via INTRAVENOUS

## 2022-04-22 MED ORDER — NITROGLYCERIN 0.4 MG SL SUBL
SUBLINGUAL_TABLET | SUBLINGUAL | Status: AC
  Filled 2022-04-22: qty 2

## 2022-04-22 MED ORDER — IOHEXOL 350 MG/ML SOLN
75.0000 mL | Freq: Once | INTRAVENOUS | Status: AC | PRN
  Administered 2022-04-22: 75 mL via INTRAVENOUS

## 2022-04-22 MED ORDER — METOPROLOL TARTRATE 5 MG/5ML IV SOLN
INTRAVENOUS | Status: AC
  Filled 2022-04-22: qty 5

## 2022-04-29 ENCOUNTER — Encounter: Payer: Self-pay | Admitting: Cardiology

## 2022-04-29 ENCOUNTER — Telehealth: Payer: Self-pay | Admitting: Internal Medicine

## 2022-04-29 NOTE — Telephone Encounter (Signed)
Error

## 2022-04-29 NOTE — Telephone Encounter (Signed)
Patient would like an OTC recommendation for itchy scalp. She was seeing a dermatologist for it. Best callback is [phone].

## 2022-04-30 NOTE — Telephone Encounter (Signed)
She can try [facility] or Head and shoulders shampoo.  Thank you

## 2022-04-30 NOTE — Telephone Encounter (Signed)
Notified pt w/MD response.../lmb 

## 2022-05-01 ENCOUNTER — Other Ambulatory Visit: Payer: Self-pay | Admitting: Cardiology

## 2022-05-13 DIAGNOSIS — H2513 Age-related nuclear cataract, bilateral: Secondary | ICD-10-CM | POA: Diagnosis not present

## 2022-05-13 DIAGNOSIS — H40033 Anatomical narrow angle, bilateral: Secondary | ICD-10-CM | POA: Diagnosis not present

## 2022-06-04 ENCOUNTER — Ambulatory Visit: Payer: Medicare Other | Admitting: Internal Medicine

## 2022-06-17 ENCOUNTER — Ambulatory Visit (INDEPENDENT_AMBULATORY_CARE_PROVIDER_SITE_OTHER): Payer: Medicare Other | Admitting: Internal Medicine

## 2022-06-17 ENCOUNTER — Encounter: Payer: Self-pay | Admitting: Internal Medicine

## 2022-06-17 VITALS — BP 110/76 | HR 80 | Temp 97.7°F | Ht 64.0 in | Wt 139.0 lb

## 2022-06-17 DIAGNOSIS — R739 Hyperglycemia, unspecified: Secondary | ICD-10-CM | POA: Diagnosis not present

## 2022-06-17 DIAGNOSIS — E785 Hyperlipidemia, unspecified: Secondary | ICD-10-CM | POA: Diagnosis not present

## 2022-06-17 DIAGNOSIS — F419 Anxiety disorder, unspecified: Secondary | ICD-10-CM

## 2022-06-17 DIAGNOSIS — I679 Cerebrovascular disease, unspecified: Secondary | ICD-10-CM

## 2022-06-17 DIAGNOSIS — I25119 Atherosclerotic heart disease of native coronary artery with unspecified angina pectoris: Secondary | ICD-10-CM

## 2022-06-17 NOTE — Assessment & Plan Note (Signed)
Discussed.

## 2022-06-17 NOTE — Progress Notes (Signed)
Subjective:  Patient ID: [name] [name], female    DOB: 04-21-1940  Age: 82 y.o. MRN: [id number]  CC: Follow-up (3 MNTH F/U)   HPI [name] [name] presents for CAD, CVA, hyperglycemia  Outpatient Medications Prior to Visit  Medication Sig Dispense Refill   ALPRAZolam (XANAX) 1 MG tablet TAKE 1/2 TO 1 TABLET BY MOUTH 2 TIMES DAILY AS NEEDED FOR ANXIETY 60 tablet 3   amoxicillin-clavulanate (AUGMENTIN) 500-125 MG tablet Take 1 tablet by mouth in the morning and at bedtime. 14 tablet 0   anastrozole (ARIMIDEX) 1 MG tablet Take 1 tablet (1 mg total) by mouth daily. 90 tablet 1   cholecalciferol (VITAMIN D) 1000 units tablet Take 2 tablets (2,000 Units total) by mouth daily. 100 tablet 3   clopidogrel (PLAVIX) 75 MG tablet TAKE 1 TABLET (75 MG TOTAL) BY MOUTH DAILY FOR 21 DAYS. 90 tablet 3   guaiFENesin (MUCINEX) 600 MG 12 hr tablet Take 1 tablet (600 mg total) by mouth 2 (two) times daily. (Patient not taking: Reported on 03/21/2022) 60 tablet 1   levothyroxine (SYNTHROID) 112 MCG tablet Take 1 tablet (112 mcg total) by mouth daily. 90 tablet 3   liothyronine (CYTOMEL) 25 MCG tablet Take 0.5 tablets (12.5 mcg total) by mouth daily. 30 tablet 5   meclizine (ANTIVERT) 12.5 MG tablet Take 1 tablet (12.5 mg total) by mouth 3 (three) times daily as needed for dizziness. 60 tablet 1   metoprolol succinate (TOPROL-XL) 25 MG 24 hr tablet Take 0.5 tablets (12.5 mg total) by mouth at bedtime. 30 tablet 5   metoprolol tartrate (LOPRESSOR) 50 MG tablet Take 1 tablet (50 mg total) by mouth as directed. Take one tablet 2 hours before CT 1 tablet 0   pantoprazole (PROTONIX) 40 MG tablet Take 1 tablet (40 mg total) by mouth daily. 90 tablet 3   rosuvastatin (CRESTOR) 20 MG tablet Take 1 tablet (20 mg total) by mouth daily. 90 tablet 1   telmisartan (MICARDIS) 20 MG tablet Take 1 tablet (20 mg total) by mouth daily. 90 tablet 3   venlafaxine XR (EFFEXOR XR) 150 MG 24 hr capsule Take 1 capsule (150 mg total) by  mouth daily with breakfast. 90 capsule 1   vitamin B-12 (CYANOCOBALAMIN) 1000 MCG tablet Take 1,000 mcg by mouth daily.     No facility-administered medications prior to visit.    ROS: Review of Systems  Constitutional:  Negative for activity change, appetite change, chills, fatigue and unexpected weight change.  HENT:  Negative for congestion, mouth sores and sinus pressure.   Eyes:  Negative for visual disturbance.  Respiratory:  Negative for cough and chest tightness.   Cardiovascular:  Negative for chest pain and palpitations.  Gastrointestinal:  Negative for abdominal pain and nausea.  Genitourinary:  Negative for difficulty urinating, frequency and vaginal pain.  Musculoskeletal:  Positive for back pain. Negative for gait problem.  Skin:  Negative for pallor and rash.  Neurological:  Negative for dizziness, tremors, weakness, numbness and headaches.  Psychiatric/Behavioral:  Negative for confusion, sleep disturbance and suicidal ideas. The patient is nervous/anxious.     Objective:  BP 110/76 (BP Location: Right Arm, Patient Position: Sitting, Cuff Size: Normal)   Pulse 80   Temp 97.7 F (36.5 C) (Oral)   Ht 5\' 4"  (1.626 m)   Wt 139 lb (63 kg)   SpO2 96%   BMI 23.86 kg/m   BP Readings from Last 3 Encounters:  06/17/22 110/76  04/22/22 108/78  04/20/22 [name])  142/83    Wt Readings from Last 3 Encounters:  06/17/22 139 lb (63 kg)  03/21/22 136 lb 9.6 oz (62 kg)  03/11/22 135 lb (61.2 kg)    Physical Exam Constitutional:      General: She is not in acute distress.    Appearance: Normal appearance. She is well-developed.  HENT:     Head: Normocephalic.     Right Ear: External ear normal.     Left Ear: External ear normal.     Nose: Nose normal.  Eyes:     General:        Right eye: No discharge.        Left eye: No discharge.     Conjunctiva/sclera: Conjunctivae normal.     Pupils: Pupils are equal, round, and reactive to light.  Neck:     Thyroid: No  thyromegaly.     Vascular: No JVD.     Trachea: No tracheal deviation.  Cardiovascular:     Rate and Rhythm: Normal rate and regular rhythm.     Heart sounds: Normal heart sounds.  Pulmonary:     Effort: No respiratory distress.     Breath sounds: No stridor. No wheezing.  Abdominal:     General: Bowel sounds are normal. There is no distension.     Palpations: Abdomen is soft. There is no mass.     Tenderness: There is no abdominal tenderness. There is no guarding or rebound.  Musculoskeletal:        General: No tenderness.     Cervical back: Normal range of motion and neck supple. No rigidity.  Lymphadenopathy:     Cervical: No cervical adenopathy.  Skin:    Findings: No erythema or rash.  Neurological:     Cranial Nerves: No cranial nerve deficit.     Motor: No abnormal muscle tone.     Coordination: Coordination normal.     Deep Tendon Reflexes: Reflexes normal.  Psychiatric:        Behavior: Behavior normal.        Thought Content: Thought content normal.        Judgment: Judgment normal.     Lab Results  Component Value Date   WBC 10.6 (H) 02/26/2022   HGB 12.5 02/26/2022   HCT 38.7 02/26/2022   PLT 332 02/26/2022   GLUCOSE 92 02/26/2022   CHOL 134 08/10/2020   TRIG 263 (H) 08/10/2020   HDL 37 (L) 08/10/2020   LDLDIRECT 125.0 06/05/2015   LDLCALC 44 08/10/2020   ALT 13 02/26/2022   AST 21 02/26/2022   NA 144 02/26/2022   K 4.4 02/26/2022   CL 109 02/26/2022   CREATININE 1.45 (H) 02/26/2022   BUN 22 02/26/2022   CO2 23 02/26/2022   TSH 0.22 (L) 11/29/2021   INR 1.1 08/16/2021   HGBA1C 6.4 06/18/2021    CT CORONARY MORPH W/CTA COR W/SCORE W/CA W/CM &/OR WO/CM  Addendum Date: 04/24/2022   ADDENDUM REPORT: 04/24/2022 22:22 EXAM: OVER-READ INTERPRETATION  CT CHEST The following report is an over-read performed by radiologist Dr. Joelene Millin [facility] on 04/24/2022. This over-read does not include interpretation of cardiac or coronary anatomy  or pathology. The coronary CTA interpretation by the cardiologist is attached. COMPARISON:  CT 03/07/2022 FINDINGS: Included portions of the lung parenchyma demonstrate no acute airspace disease. Mild subpleural reticulation and fibrosis. Included mediastinum shows no specific abnormality. There is mild aortic atherosclerosis. Included portions of upper abdomen demonstrate calcified granuloma in the left  hepatic lobe. No suspicious bony lesions. IMPRESSION: 1. Mild aortic atherosclerosis. Aortic Atherosclerosis (ICD10-I70.0). Electronically Signed   By: Jasmine Pang M.D.   On: 04/24/2022 22:22   Result Date: 04/24/2022 CLINICAL DATA:  Chest pain EXAM: Cardiac CTA MEDICATIONS: Sub lingual nitro. 4mg  and lopressor 50mg  TECHNIQUE: The patient was scanned on a Siemens Force 192 slice scanner. Gantry rotation speed was 250 msecs. Collimation was .6 mm. A 100 kV prospective scan was triggered in the ascending thoracic aorta at 140 HU's Full mA was used between 35% and 75% of the R-R interval. Average HR during the scan was bpm. The 3D data set was interpreted on a dedicated work station using MPR, MIP and VRT modes. A total of 80cc of contrast was used. FINDINGS: Non-cardiac: See separate report from [facility]. No significant findings on limited lung and soft tissue windows. Calcium Score: 3 vessel calcium noted LM 0 LAD 224 LCX 39.4 RCA 639 Total:  902 Coronary Arteries: Right dominant with no anomalies LM: Normal LAD: 25-49% mixed plaque at ostium and proximal vessel, D1: 25-49% calcified plaque D2: 1-24% mixed plaque Circumflex: 1-24% calcified plaque proximally OM1: Small vessel not well seen no calcium OM2: Normal RCA: 1-24% calcified plaque proximal, mid and distal vessel. PDA: 1-24% calcified plaque PLA: Normal IMPRESSION: 1. Calcium score 902 which is 87 th percentile for age/sex 2.  Normal ascending thoracic aorta 2.9 cm 3.  CAD RADS 2 non obstructive CAD see description above Charlton Haws  Electronically Signed: By: Charlton Haws M.D. On: 04/22/2022 14:54    Assessment & Plan:   Problem List Items Addressed This Visit     Dyslipidemia - Primary    Calcium CT score 902 which is 87 th percentile for age/sex Non obstructive CAD, mild stenosis on coronary CT On Plavix, Crestor, Toprol F/u w/Dr Anne Fu      Relevant Orders   Comprehensive metabolic panel   CBC with Differential/Platelet   Hemoglobin A1c   TSH   T4, free   Anxiety disorder    Discussed      Hyperglycemia    Monitor A1c      Relevant Orders   Comprehensive metabolic panel   Hemoglobin A1c   Coronary artery disease    2024 Calcium CT score 902 which is 87 th percentile for age/sex Non obstructive CAD, mild stenosis. On Plavix, Crestor, Toprol F/u w/Dr Anne Fu No CP      Relevant Orders   CBC with Differential/Platelet   Cerebrovascular disease     On Plavix, Crestor, Toprol       Relevant Orders   Comprehensive metabolic panel   CBC with Differential/Platelet      No orders of the defined types were placed in this encounter.     Follow-up: Return in about 3 months (around 09/17/2022) for a follow-up visit.  Sonda Primes, MD

## 2022-06-17 NOTE — Assessment & Plan Note (Signed)
Monitor A1c 

## 2022-06-17 NOTE — Assessment & Plan Note (Signed)
  On Plavix, Crestor, Toprol

## 2022-06-17 NOTE — Assessment & Plan Note (Signed)
Calcium CT score 902 which is 87 th percentile for age/sex Non obstructive CAD, mild stenosis on coronary CT On Plavix, Crestor, Toprol F/u w/Dr Anne Fu

## 2022-06-17 NOTE — Assessment & Plan Note (Signed)
2024 Calcium CT score 902 which is 87 th percentile for age/sex Non obstructive CAD, mild stenosis. On Plavix, Crestor, Toprol F/u w/Dr Anne Fu No CP

## 2022-06-20 LAB — CBC WITH DIFFERENTIAL/PLATELET
Basophils Absolute: 0 10*3/uL (ref 0.0–0.1)
Basophils Relative: 0.4 % (ref 0.0–3.0)
Eosinophils Absolute: 0.2 10*3/uL (ref 0.0–0.7)
Eosinophils Relative: 3.5 % (ref 0.0–5.0)
HCT: 33.7 % — ABNORMAL LOW (ref 36.0–46.0)
Hemoglobin: 11.1 g/dL — ABNORMAL LOW (ref 12.0–15.0)
Lymphocytes Relative: 43.1 % (ref 12.0–46.0)
Lymphs Abs: 2.1 10*3/uL (ref 0.7–4.0)
MCHC: 33 g/dL (ref 30.0–36.0)
MCV: 83.7 fl (ref 78.0–100.0)
Monocytes Absolute: 0.4 10*3/uL (ref 0.1–1.0)
Monocytes Relative: 8.6 % (ref 3.0–12.0)
Neutro Abs: 2.2 10*3/uL (ref 1.4–7.7)
Neutrophils Relative %: 44.4 % (ref 43.0–77.0)
Platelets: 335 10*3/uL (ref 150.0–400.0)
RBC: 4.02 Mil/uL (ref 3.87–5.11)
RDW: 15.2 % (ref 11.5–15.5)
WBC: 4.9 10*3/uL (ref 4.0–10.5)

## 2022-06-20 LAB — COMPREHENSIVE METABOLIC PANEL
ALT: 13 U/L (ref 0–35)
AST: 21 U/L (ref 0–37)
Albumin: 3.8 g/dL (ref 3.5–5.2)
Alkaline Phosphatase: 53 U/L (ref 39–117)
BUN: 18 mg/dL (ref 6–23)
CO2: 23 mEq/L (ref 19–32)
Calcium: 9.3 mg/dL (ref 8.4–10.5)
Chloride: 107 mEq/L (ref 96–112)
Creatinine, Ser: 1.2 mg/dL (ref 0.40–1.20)
GFR: 41.94 mL/min — ABNORMAL LOW (ref >60.00)
Glucose, Bld: 150 mg/dL — ABNORMAL HIGH (ref 70–99)
Potassium: 3.6 mEq/L (ref 3.5–5.1)
Sodium: 139 mEq/L (ref 135–145)
Total Bilirubin: 0.4 mg/dL (ref 0.2–1.2)
Total Protein: 6.8 g/dL (ref 6.0–8.3)

## 2022-06-20 LAB — TSH: TSH: 0.08 u[IU]/mL — ABNORMAL LOW (ref 0.35–5.50)

## 2022-06-20 LAB — T4, FREE: Free T4: 1.1 ng/dL (ref 0.60–1.60)

## 2022-06-20 LAB — HEMOGLOBIN A1C: Hgb A1c MFr Bld: 6.2 % (ref 4.6–6.5)

## 2022-06-21 ENCOUNTER — Ambulatory Visit (INDEPENDENT_AMBULATORY_CARE_PROVIDER_SITE_OTHER): Payer: Medicare Other

## 2022-06-21 VITALS — Wt 139.0 lb

## 2022-06-21 DIAGNOSIS — Z0000 Encounter for general adult medical examination without abnormal findings: Secondary | ICD-10-CM | POA: Diagnosis not present

## 2022-06-21 NOTE — Progress Notes (Signed)
Subjective:   [name] [name] is a 82 y.o. female who presents for Medicare Annual (Subsequent) preventive examination.  Review of Systems    I connected with  [name] [name] on 06/21/22 by a audio enabled telemedicine application and verified that I am speaking with the correct person using two identifiers.  Patient Location: Home  Provider Location: Home Office  I discussed the limitations of evaluation and management by telemedicine. The patient expressed understanding and agreed to proceed.  Cardiac Risk Factors include: advanced age (>60men, >53 women);hypertension     Objective:    Today's Vitals   06/21/22 0914  Weight: 139 lb (63 kg)   Body mass index is 23.86 kg/m.     06/21/2022    9:20 AM 02/27/2022    2:13 PM 08/16/2021    7:40 PM 06/18/2021   10:03 AM 06/18/2021   10:02 AM 10/12/2020    1:55 PM 09/13/2020   11:23 AM  Advanced Directives  Does Patient Have a Medical Advance Directive? Yes No No  Yes No Yes  Type of Estate agent of [place];Living will   Healthcare Power of [place];Living will Healthcare Power of [place];Living will  Living will  Does patient want to make changes to medical advance directive?       No - Patient declined  Copy of Healthcare Power of Attorney in Chart? No - copy requested   No - copy requested No - copy requested    Would patient like information on creating a medical advance directive?  No - Patient declined    No - Patient declined     Current Medications (verified) Outpatient Encounter Medications as of 06/21/2022  Medication Sig   ALPRAZolam (XANAX) 1 MG tablet TAKE 1/2 TO 1 TABLET BY MOUTH 2 TIMES DAILY AS NEEDED FOR ANXIETY   amoxicillin-clavulanate (AUGMENTIN) 500-125 MG tablet Take 1 tablet by mouth in the morning and at bedtime.   anastrozole (ARIMIDEX) 1 MG tablet Take 1 tablet (1 mg total) by mouth daily.   cholecalciferol (VITAMIN D) 1000 units tablet Take 2 tablets (2,000 Units total) by mouth daily.    clopidogrel (PLAVIX) 75 MG tablet TAKE 1 TABLET (75 MG TOTAL) BY MOUTH DAILY FOR 21 DAYS.   guaiFENesin (MUCINEX) 600 MG 12 hr tablet Take 1 tablet (600 mg total) by mouth 2 (two) times daily.   levothyroxine (SYNTHROID) 112 MCG tablet Take 1 tablet (112 mcg total) by mouth daily.   liothyronine (CYTOMEL) 25 MCG tablet Take 0.5 tablets (12.5 mcg total) by mouth daily.   meclizine (ANTIVERT) 12.5 MG tablet Take 1 tablet (12.5 mg total) by mouth 3 (three) times daily as needed for dizziness.   metoprolol succinate (TOPROL-XL) 25 MG 24 hr tablet Take 0.5 tablets (12.5 mg total) by mouth at bedtime.   metoprolol tartrate (LOPRESSOR) 50 MG tablet Take 1 tablet (50 mg total) by mouth as directed. Take one tablet 2 hours before CT   pantoprazole (PROTONIX) 40 MG tablet Take 1 tablet (40 mg total) by mouth daily.   rosuvastatin (CRESTOR) 20 MG tablet Take 1 tablet (20 mg total) by mouth daily.   telmisartan (MICARDIS) 20 MG tablet Take 1 tablet (20 mg total) by mouth daily.   venlafaxine XR (EFFEXOR XR) 150 MG 24 hr capsule Take 1 capsule (150 mg total) by mouth daily with breakfast.   vitamin B-12 (CYANOCOBALAMIN) 1000 MCG tablet Take 1,000 mcg by mouth daily.   No facility-administered encounter medications on file as of 06/21/2022.  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)   HTN (hypertension)    Hyperlipidemia    Hypothyroidism    Personal history of chemotherapy 2002   Personal history of radiation therapy 2002   Psoriasis    S/P thyroidectomy 07/2005   2 cm largest diameter (1  other tiny focus)/ i-131 rx 99 [facility] 08/2005   Thyroid cancer (HCC)    Papillary Stage 1 - Dr Everardo All   Vitamin B12 deficiency 2009   Vitamin D deficiency 2009   Past Surgical History:  Procedure Laterality Date   BREAST LUMPECTOMY     BREAST LUMPECTOMY WITH RADIOACTIVE SEED AND SENTINEL LYMPH NODE BIOPSY Left 09/11/2016   Procedure: LEFT BREAST LUMPECTOMY WITH RADIOACTIVE SEED AND LEFT AXILLARY SENTINEL LYMPH NODE BIOPSY WITH BLUE DYE INJECTION;  Surgeon: Claud Kelp, MD;  Location: [facility];  Service: General;  Laterality: Left;   IR FLUORO GUIDE PORT INSERTION RIGHT  09/13/2016   IR REMOVAL TUN ACCESS W/ PORT W/O FL MOD SED  12/30/2016   IR US GUIDE VASC ACCESS RIGHT  09/13/2016   MASTECTOMY Right    Right   PORTACATH PLACEMENT N/A 09/11/2016   Procedure: ATTEMPTED INSERTION PORT-A-CATH WITH ULTRA SOUND GUIDANCE;  Surgeon: Claud Kelp, MD;  Location: [facility];  Service: General;  Laterality: N/A;   REDUCTION MAMMAPLASTY Left 2005   THYROIDECTOMY  2007   Family History  Problem Relation Age of Onset   Stroke Mother    Allergies Mother    Asthma Mother    Clotting disorder Mother    Heart disease Father 73       MI   Allergies Sister    Asthma Sister    Breast cancer Sister 80       Lymphoma 45; currently 60   Lymphoma Sister    Hyperparathyroidism Sister    Asthma Brother    Leukemia Brother        dx 34s   Allergies Daughter    Asthma Daughter    Allergies Sister    Breast cancer Sister 3       currently 31   Kidney cancer Paternal Aunt        kidney ca; deceased 80   Liver cancer Paternal Uncle        unk. 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  Tobacco Use   Smoking status: Never   Smokeless tobacco:  Never  Vaping Use   Vaping Use: Never used  Substance and Sexual Activity   Alcohol use: No   Drug use: No   Sexual activity: Not Currently  Other Topics Concern   Not on file  Social History Narrative   GI - Dr Kinnie Scales   Depression - Dr Evelene Croon   GYN - Dr Kathyrn Sheriff      Social Determinants of Health   Financial Resource Strain: Low Risk  (06/21/2022)   Overall Financial Resource Strain (CARDIA)    Difficulty of Paying Living Expenses: Not hard at all  Food Insecurity: No Food Insecurity (06/21/2022)   Hunger Vital Sign    Worried About Running Out of Food in the Last Year: Never true    Ran Out of Food in the Last Year: Never true  Transportation Needs: No Transportation Needs (06/21/2022)   PRAPARE - Administrator, Civil Service (Medical): No    Lack of Transportation (Non-Medical): No  Physical Activity: Insufficiently Active (06/21/2022)   Exercise Vital Sign    Days of Exercise per Week: 7 days    Minutes of Exercise per Session: 20 min  Stress: No Stress Concern Present (06/21/2022)   [employer] of Occupational Health - Occupational Stress Questionnaire    Feeling of Stress : Not at all  Social Connections: Socially Integrated (06/21/2022)   Social Connection and Isolation Panel [NHANES]    Frequency of Communication with Friends and Family: More than three times a week    Frequency of Social Gatherings with Friends and Family: More than three times a week    Attends Religious Services: More than 4 times per year    Active Member of [employer] or Organizations: Yes    Attends Engineer, structural: More than 4 times per year    Marital Status: Married    Tobacco  Counseling Counseling given: Yes   Clinical Intake:  Pre-visit preparation completed: Yes  Pain : No/denies pain     BMI - recorded: 23.86 Nutritional Status: BMI of 19-24  Normal Nutritional Risks: None Diabetes: No  How often do you need to have someone help you when you read instructions, pamphlets, or other written materials from your doctor or pharmacy?: 1 - Never  Diabetic?no  Interpreter Needed?: No  Information entered by :: Fredirick Maudlin   Activities of Daily Living    06/21/2022    9:22 AM  In your present state of health, do you have any difficulty performing the following activities:  Hearing? 1  Vision? 0  Difficulty concentrating or making decisions? 0  Walking or climbing stairs? 0  Dressing or bathing? 0  Doing errands, shopping? 0  Preparing Food and eating ? N  Using the Toilet? N  In the past six months, have you accidently leaked urine? Y  Do you have problems with loss of bowel control? N  Managing your Medications? N  Managing your Finances? N  Housekeeping or managing your Housekeeping? N    Patient Care Team: Plotnikov, Georgina Quint, MD as PCP - General Causey, Larna Daughters, NP as Nurse Practitioner (Hematology and Oncology)  Indicate any recent Medical Services you may have received from other than [facility] providers in the past year (date may be approximate).     Assessment:   This is a routine wellness examination for [place].  Hearing/Vision screen Hearing Screening - Comments:: Some hearing loss in left ear  Vision Screening - Comments:: Wears rx glasses - up to date with  routine eye exams with  [facility]   Dietary issues and exercise activities discussed: Current Exercise Habits: Home exercise routine;Structured exercise class, Type of exercise: walking;strength training/weights;treadmill, Time (Minutes): 45, Frequency (Times/Week): 5, Weekly Exercise (Minutes/Week): 225, Intensity: Mild   Goals Addressed    None   Depression Screen    06/17/2022    1:38 PM 03/05/2022    2:12 PM 08/27/2021    4:18 PM 06/18/2021    2:58 PM 06/18/2021   10:03 AM 02/19/2021    2:20 PM 07/20/2019    4:31 PM  PHQ 2/9 Scores  PHQ - 2 Score 0 0 0 3 0 0 6  PHQ- 9 Score   0 5  0 17    Fall Risk    06/21/2022    9:21 AM 06/17/2022    1:38 PM 03/05/2022    2:12 PM 08/27/2021    4:18 PM 06/18/2021   10:03 AM  Fall Risk   Falls in the past year? 0 0 0 0 0  Number falls in past yr: 0 0 0 0 0  Injury with Fall? 0 0 0 0 0  Risk for fall due to : No Fall Risks No Fall Risks No Fall Risks    Follow up Falls prevention discussed;Falls evaluation completed Falls evaluation completed Falls evaluation completed  Falls evaluation completed    FALL RISK PREVENTION PERTAINING TO THE HOME:  Any stairs in or around the home? No  If so, are there any without handrails? No  Home free of loose throw rugs in walkways, pet beds, electrical cords, etc? No  Adequate lighting in your home to reduce risk of falls? Yes   ASSISTIVE DEVICES UTILIZED TO PREVENT FALLS:  Life alert? No  Use of a cane, walker or w/c? No  Grab bars in the bathroom? Yes  Shower chair or bench in shower? Yes  Elevated toilet seat or a handicapped toilet? No   TIMED UP AND GO:  Was the test performed? No . Televisit   Cognitive Function:        06/21/2022    9:17 AM  6CIT Screen  What Year? 0 points  What month? 0 points  What time? 0 points  Count back from 20 0 points  Months in reverse 0 points  Repeat phrase 0 points  Total Score 0 points    Immunizations Immunization History  Administered Date(s) Administered   PFIZER(Purple Top)SARS-COV-2 Vaccination 04/15/2019, 05/12/2019, 12/25/2019   Pneumococcal Conjugate-13 06/05/2015   Pneumococcal Polysaccharide-23 05/02/2020   Td 05/26/2012    Pneumococcal vaccine status: Due, Education has been provided regarding the importance of this vaccine. Advised may receive this vaccine at local  pharmacy or Health Dept. Aware to provide a copy of the vaccination record if obtained from local pharmacy or Health Dept. Verbalized acceptance and understanding.  Flu Vaccine status: Declined, Education has been provided regarding the importance of this vaccine but patient still declined. Advised may receive this vaccine at local pharmacy or Health Dept. Aware to provide a copy of the vaccination record if obtained from local pharmacy or Health Dept. Verbalized acceptance and understanding.  Pneumococcal vaccine status: Declined,  Education has been provided regarding the importance of this vaccine but patient still declined. Advised may receive this vaccine at local pharmacy or Health Dept. Aware to provide a copy of the vaccination record if obtained from local pharmacy or Health Dept. Verbalized acceptance and understanding.   Covid-19 vaccine status: Completed vaccines  Qualifies for Shingles  Vaccine? Yes   Zostavax completed No   Shingrix Completed?: No.    Education has been provided regarding the importance of this vaccine. Patient has been advised to call insurance company to determine out of pocket expense if they have not yet received this vaccine. Advised may also receive vaccine at local pharmacy or Health Dept. Verbalized acceptance and understanding.  Screening Tests Health Maintenance  Topic Date Due   DTaP/Tdap/Td (2 - Tdap) 05/27/2022   Zoster Vaccines- Shingrix (1 of 2) 08/01/2022 (Originally 03/26/1959)   COVID-19 Vaccine (4 - 2023-24 season) 07/30/2023 (Originally 10/12/2021)   Medicare Annual Wellness (AWV)  06/21/2023   Pneumonia Vaccine 29+ Years old  Completed   DEXA SCAN  Completed   HPV VACCINES  Aged Out   INFLUENZA VACCINE  Discontinued    Health Maintenance  Health Maintenance Due  Topic Date Due   DTaP/Tdap/Td (2 - Tdap) 05/27/2022    Colorectal cancer screening: No longer required.   Mammogram status: Completed 08/28/21. Repeat every year  Bone  Density status: Completed 05/03/21. Results reflect: Bone density results: OSTEOPENIA. Repeat every 2 years.  Lung Cancer Screening: (Low Dose CT Chest recommended if Age 89-80 years, 30 pack-year currently smoking OR have quit w/in 15years.) does not qualify.     Additional Screening:  Hepatitis C Screening: does not qualify  Vision Screening: Recommended annual ophthalmology exams for early detection of glaucoma and other disorders of the eye. Is the patient up to date with their annual eye exam?  Yes  Who is the provider or what is the name of the office in which the patient attends annual eye exams? [facility] If pt is not established with a provider, would they like to be referred to a provider to establish care? No .   Dental Screening: Recommended annual dental exams for proper oral hygiene  Community Resource Referral / Chronic Care Management: CRR required this visit?  No   CCM required this visit?  No      Plan:     I have personally reviewed and noted the following in the patient's chart:   Medical and social history Use of alcohol, tobacco or illicit drugs  Current medications and supplements including opioid prescriptions. Patient is not currently taking opioid prescriptions. Functional ability and status Nutritional status Physical activity Advanced directives List of other physicians Hospitalizations, surgeries, and ER visits in previous 12 months Vitals Screenings to include cognitive, depression, and falls Referrals and appointments  In addition, I have reviewed and discussed with patient certain preventive protocols, quality metrics, and best practice recommendations. A written personalized care plan for preventive services as well as general preventive health recommendations were provided to patient.     Annabell Sabal, CMA   06/21/2022   Nurse Notes: none

## 2022-06-21 NOTE — Patient Instructions (Signed)
[name] , Thank you for taking time to come for your Medicare Wellness Visit. I appreciate your ongoing commitment to your health goals. Please review the following plan we discussed and let me know if I can assist you in the future.   These are the goals we discussed:  Goals   None     This is a list of the screening recommended for you and due dates:  Health Maintenance  Topic Date Due   DTaP/Tdap/Td vaccine (2 - Tdap) 05/27/2022   Zoster (Shingles) Vaccine (1 of 2) 08/01/2022*   COVID-19 Vaccine (4 - 2023-24 season) 07/30/2023*   Medicare Annual Wellness Visit  06/21/2023   Pneumonia Vaccine  Completed   DEXA scan (bone density measurement)  Completed   HPV Vaccine  Aged Out   Flu Shot  Discontinued  *Topic was postponed. The date shown is not the original due date.    Advanced directives: Please bring a copy of your health care power of attorney and living will to the office to be added to your chart at your convenience.   Conditions/risks identified: Aim for 30 minutes of exercise or brisk walking, 6-8 glasses of water, and 5 servings of fruits and vegetables each day.   Next appointment: Follow up in one year for your annual wellness visit    Preventive Care 83 Years and Older, Female Preventive care refers to lifestyle choices and visits with your health care provider that can promote health and wellness. What does preventive care include? A yearly physical exam. 82 is also called an annual well check. Dental exams once or twice a year. Routine eye exams. Ask your health care provider how often you should have your eyes checked. Personal lifestyle choices, including: Daily care of your teeth and gums. Regular physical activity. Eating a healthy diet. Avoiding tobacco and drug use. Limiting alcohol use. Practicing safe sex. Taking low-dose aspirin every day. Taking vitamin and mineral supplements as recommended by your health care provider. What happens during  an annual well check? The services and screenings done by your health care provider during your annual well check will depend on your age, overall health, lifestyle risk factors, and family history of disease. Counseling  Your health care provider may ask you questions about your: Alcohol use. Tobacco use. Drug use. Emotional well-being. Home and relationship well-being. Sexual activity. Eating habits. History of falls. Memory and ability to understand (cognition). Work and work Astronomer. Reproductive health. Screening  You may have the following tests or measurements: Height, weight, and BMI. Blood pressure. Lipid and cholesterol levels. These may be checked every 5 years, or more frequently if you are over 66 years old. Skin check. Lung cancer screening. You may have this screening every year starting at age 83 if you have a 30-pack-year history of smoking and currently smoke or have quit within the past 15 years. Fecal occult blood test (FOBT) of the stool. You may have this test every year starting at age 83. Flexible sigmoidoscopy or colonoscopy. You may have a sigmoidoscopy every 5 years or a colonoscopy every 10 years starting at age 83. Hepatitis C blood test. Hepatitis B blood test. Sexually transmitted disease (STD) testing. Diabetes screening. This is done by checking your blood sugar (glucose) after you have not eaten for a while (fasting). You may have this done every 1-3 years. Bone density scan. This is done to screen for osteoporosis. You may have this done starting at age 83. Mammogram. This may be done  every 1-2 years. Talk to your health care provider about how often you should have regular mammograms. Talk with your health care provider about your test results, treatment options, and if necessary, the need for more tests. Vaccines  Your health care provider may recommend certain vaccines, such as: Influenza vaccine. This is recommended every year. Tetanus,  diphtheria, and acellular pertussis (Tdap, Td) vaccine. You may need a Td booster every 10 years. Zoster vaccine. You may need this after age 83. Pneumococcal 13-valent conjugate (PCV13) vaccine. One dose is recommended after age 83. Pneumococcal polysaccharide (PPSV23) vaccine. One dose is recommended after age 83. Talk to your health care provider about which screenings and vaccines you need and how often you need them. This information is not intended to replace advice given to you by your health care provider. Make sure you discuss any questions you have with your health care provider. Document Released: 02/24/2015 Document Revised: 10/18/2015 Document Reviewed: 11/29/2014 Elsevier Interactive Patient Education  2017 [employer] Prevention in the Home Falls can cause injuries. They can happen to people of all ages. There are many things you can do to make your home safe and to help prevent falls. What can I do on the outside of my home? Regularly fix the edges of walkways and driveways and fix any cracks. Remove anything that might make you trip as you walk through a door, such as a raised step or threshold. Trim any bushes or trees on the path to your home. Use bright outdoor lighting. Clear any walking paths of anything that might make someone trip, such as rocks or tools. Regularly check to see if handrails are loose or broken. Make sure that both sides of any steps have handrails. Any raised decks and porches should have guardrails on the edges. Have any leaves, snow, or ice cleared regularly. Use sand or salt on walking paths during winter. Clean up any spills in your garage right away. This includes oil or grease spills. What can I do in the bathroom? Use night lights. Install grab bars by the toilet and in the tub and shower. Do not use towel bars as grab bars. Use non-skid mats or decals in the tub or shower. If you need to sit down in the shower, use a plastic,  non-slip stool. Keep the floor dry. Clean up any water that spills on the floor as soon as it happens. Remove soap buildup in the tub or shower regularly. Attach bath mats securely with double-sided non-slip rug tape. Do not have throw rugs and other things on the floor that can make you trip. What can I do in the bedroom? Use night lights. Make sure that you have a light by your bed that is easy to reach. Do not use any sheets or blankets that are too big for your bed. They should not hang down onto the floor. Have a firm chair that has side arms. You can use this for support while you get dressed. Do not have throw rugs and other things on the floor that can make you trip. What can I do in the kitchen? Clean up any spills right away. Avoid walking on wet floors. Keep items that you use a lot in easy-to-reach places. If you need to reach something above you, use a strong step stool that has a grab bar. Keep electrical cords out of the way. Do not use floor polish or wax that makes floors slippery. If you must use wax, use  non-skid floor wax. Do not have throw rugs and other things on the floor that can make you trip. What can I do with my stairs? Do not leave any items on the stairs. Make sure that there are handrails on both sides of the stairs and use them. Fix handrails that are broken or loose. Make sure that handrails are as long as the stairways. Check any carpeting to make sure that it is firmly attached to the stairs. Fix any carpet that is loose or worn. Avoid having throw rugs at the top or bottom of the stairs. If you do have throw rugs, attach them to the floor with carpet tape. Make sure that you have a light switch at the top of the stairs and the bottom of the stairs. If you do not have them, ask someone to add them for you. What else can I do to help prevent falls? Wear shoes that: Do not have high heels. Have rubber bottoms. Are comfortable and fit you well. Are closed  at the toe. Do not wear sandals. If you use a stepladder: Make sure that it is fully opened. Do not climb a closed stepladder. Make sure that both sides of the stepladder are locked into place. Ask someone to hold it for you, if possible. Clearly mark and make sure that you can see: Any grab bars or handrails. First and last steps. Where the edge of each step is. Use tools that help you move around (mobility aids) if they are needed. These include: Canes. Walkers. Scooters. Crutches. Turn on the lights when you go into a dark area. Replace any light bulbs as soon as they burn out. Set up your furniture so you have a clear path. Avoid moving your furniture around. If any of your floors are uneven, fix them. If there are any pets around you, be aware of where they are. Review your medicines with your doctor. Some medicines can make you feel dizzy. This can increase your chance of falling. Ask your doctor what other things that you can do to help prevent falls. This information is not intended to replace advice given to you by your health care provider. Make sure you discuss any questions you have with your health care provider. Document Released: 11/24/2008 Document Revised: 07/06/2015 Document Reviewed: 03/04/2014 Elsevier Interactive Patient Education  2017 [employer].

## 2022-07-04 ENCOUNTER — Other Ambulatory Visit: Payer: Self-pay | Admitting: Internal Medicine

## 2022-07-04 NOTE — Telephone Encounter (Signed)
MD out of the office til June 4th. Pls advise.Marland KitchenRaechel Chute

## 2022-07-25 ENCOUNTER — Telehealth: Payer: Self-pay | Admitting: Adult Health

## 2022-07-25 NOTE — Telephone Encounter (Signed)
Patient left a voicemail regarding appointment information. Was able to complete the call and give a reminder regarding upcoming appointments. Patient is aware of those appointments.

## 2022-07-26 ENCOUNTER — Other Ambulatory Visit: Payer: Self-pay | Admitting: Internal Medicine

## 2022-07-26 DIAGNOSIS — Z0000 Encounter for general adult medical examination without abnormal findings: Secondary | ICD-10-CM

## 2022-07-27 ENCOUNTER — Other Ambulatory Visit: Payer: Self-pay | Admitting: Internal Medicine

## 2022-08-23 ENCOUNTER — Other Ambulatory Visit: Payer: Self-pay

## 2022-08-23 DIAGNOSIS — Z170 Estrogen receptor positive status [ER+]: Secondary | ICD-10-CM

## 2022-08-26 ENCOUNTER — Inpatient Hospital Stay: Payer: Medicare Other

## 2022-08-26 ENCOUNTER — Inpatient Hospital Stay: Payer: Medicare Other | Admitting: Adult Health

## 2022-08-26 ENCOUNTER — Telehealth: Payer: Self-pay | Admitting: Adult Health

## 2022-08-26 ENCOUNTER — Other Ambulatory Visit: Payer: Self-pay | Admitting: Internal Medicine

## 2022-08-26 NOTE — Telephone Encounter (Signed)
Patient is aware of upcoming appointment times/dates.  

## 2022-08-30 ENCOUNTER — Ambulatory Visit
Admission: RE | Admit: 2022-08-30 | Discharge: 2022-08-30 | Disposition: A | Discharge status: Home / Self Care | Payer: Medicare Other | Source: Ambulatory Visit | Attending: Internal Medicine | Admitting: Internal Medicine

## 2022-08-30 DIAGNOSIS — Z0000 Encounter for general adult medical examination without abnormal findings: Secondary | ICD-10-CM

## 2022-09-06 ENCOUNTER — Inpatient Hospital Stay: Payer: Medicare Other

## 2022-09-06 ENCOUNTER — Inpatient Hospital Stay: Payer: Medicare Other | Attending: Adult Health

## 2022-09-06 ENCOUNTER — Other Ambulatory Visit: Payer: Self-pay

## 2022-09-06 ENCOUNTER — Inpatient Hospital Stay (HOSPITAL_BASED_OUTPATIENT_CLINIC_OR_DEPARTMENT_OTHER): Payer: Medicare Other | Admitting: Adult Health

## 2022-09-06 ENCOUNTER — Encounter: Payer: Self-pay | Admitting: Adult Health

## 2022-09-06 VITALS — BP 127/72 | HR 115 | Temp 97.8°F | Resp 18 | Ht 64.0 in | Wt 140.4 lb

## 2022-09-06 DIAGNOSIS — Z170 Estrogen receptor positive status [ER+]: Secondary | ICD-10-CM | POA: Insufficient documentation

## 2022-09-06 DIAGNOSIS — Z85850 Personal history of malignant neoplasm of thyroid: Secondary | ICD-10-CM | POA: Insufficient documentation

## 2022-09-06 DIAGNOSIS — Z8051 Family history of malignant neoplasm of kidney: Secondary | ICD-10-CM | POA: Diagnosis not present

## 2022-09-06 DIAGNOSIS — C50412 Malignant neoplasm of upper-outer quadrant of left female breast: Secondary | ICD-10-CM | POA: Insufficient documentation

## 2022-09-06 DIAGNOSIS — M810 Age-related osteoporosis without current pathological fracture: Secondary | ICD-10-CM | POA: Diagnosis not present

## 2022-09-06 DIAGNOSIS — Z79811 Long term (current) use of aromatase inhibitors: Secondary | ICD-10-CM | POA: Insufficient documentation

## 2022-09-06 DIAGNOSIS — Z806 Family history of leukemia: Secondary | ICD-10-CM | POA: Insufficient documentation

## 2022-09-06 DIAGNOSIS — Z923 Personal history of irradiation: Secondary | ICD-10-CM | POA: Insufficient documentation

## 2022-09-06 DIAGNOSIS — Z800 Family history of malignant neoplasm of digestive organs: Secondary | ICD-10-CM | POA: Diagnosis not present

## 2022-09-06 DIAGNOSIS — M818 Other osteoporosis without current pathological fracture: Secondary | ICD-10-CM | POA: Diagnosis not present

## 2022-09-06 DIAGNOSIS — Z9221 Personal history of antineoplastic chemotherapy: Secondary | ICD-10-CM | POA: Insufficient documentation

## 2022-09-06 DIAGNOSIS — Z803 Family history of malignant neoplasm of breast: Secondary | ICD-10-CM | POA: Diagnosis not present

## 2022-09-06 DIAGNOSIS — Z807 Family history of other malignant neoplasms of lymphoid, hematopoietic and related tissues: Secondary | ICD-10-CM | POA: Insufficient documentation

## 2022-09-06 DIAGNOSIS — Z95828 Presence of other vascular implants and grafts: Secondary | ICD-10-CM

## 2022-09-06 LAB — CBC WITH DIFFERENTIAL (CANCER CENTER ONLY)
Abs Immature Granulocytes: 0.02 10*3/uL (ref 0.00–0.07)
Basophils Absolute: 0 10*3/uL (ref 0.0–0.1)
Basophils Relative: 1 %
Eosinophils Absolute: 0.2 10*3/uL (ref 0.0–0.5)
Eosinophils Relative: 4 %
HCT: 33.1 % — ABNORMAL LOW (ref 36.0–46.0)
Hemoglobin: 10.6 g/dL — ABNORMAL LOW (ref 12.0–15.0)
Immature Granulocytes: 0 %
Lymphocytes Relative: 35 %
Lymphs Abs: 2.2 10*3/uL (ref 0.7–4.0)
MCH: 27.5 pg (ref 26.0–34.0)
MCHC: 32 g/dL (ref 30.0–36.0)
MCV: 85.8 fL (ref 80.0–100.0)
Monocytes Absolute: 0.4 10*3/uL (ref 0.1–1.0)
Monocytes Relative: 7 %
Neutro Abs: 3.4 10*3/uL (ref 1.7–7.7)
Neutrophils Relative %: 53 %
Platelet Count: 293 10*3/uL (ref 150–400)
RBC: 3.86 MIL/uL — ABNORMAL LOW (ref 3.87–5.11)
RDW: 14.5 % (ref 11.5–15.5)
WBC Count: 6.2 10*3/uL (ref 4.0–10.5)
nRBC: 0 % (ref 0.0–0.2)

## 2022-09-06 LAB — CMP (CANCER CENTER ONLY)
ALT: 11 U/L (ref 0–44)
AST: 18 U/L (ref 15–41)
Albumin: 3.9 g/dL (ref 3.5–5.0)
Alkaline Phosphatase: 75 U/L (ref 38–126)
Anion gap: 6 (ref 5–15)
BUN: 22 mg/dL (ref 8–23)
CO2: 27 mmol/L (ref 22–32)
Calcium: 10 mg/dL (ref 8.9–10.3)
Chloride: 109 mmol/L (ref 98–111)
Creatinine: 1.3 mg/dL — ABNORMAL HIGH (ref 0.44–1.00)
GFR, Estimated: 41 mL/min — ABNORMAL LOW (ref >60)
Glucose, Bld: 138 mg/dL — ABNORMAL HIGH (ref 70–99)
Potassium: 4.1 mmol/L (ref 3.5–5.1)
Sodium: 142 mmol/L (ref 135–145)
Total Bilirubin: 0.4 mg/dL (ref 0.3–1.2)
Total Protein: 6.6 g/dL (ref 6.5–8.1)

## 2022-09-06 MED ORDER — DENOSUMAB 60 MG/ML ~~LOC~~ SOSY
60.0000 mg | PREFILLED_SYRINGE | Freq: Once | SUBCUTANEOUS | Status: AC
  Filled 2022-09-06: qty 1

## 2022-09-06 NOTE — Progress Notes (Signed)
[facility] Cancer Follow up:    [name], [name] Quint, MD [address] [place] [address]   DIAGNOSIS:  Cancer Staging  Malignant neoplasm of upper-outer quadrant of left breast in female, estrogen receptor positive (HCC) Staging form: Breast, AJCC 8th Edition - Pathologic: Stage IB (pT1b, pN0, cM0, G3, ER-, PR-, HER2-) - Unsigned Histologic grading system: 3 grade system   SUMMARY OF ONCOLOGIC HISTORY: (1) status post right lumpectomy and sentinel lymph node biopsy in September 2002 for multifocal breast carcinoma, triple negative.  Treated with CMF followed by radiation.    (2) Local recurrence in April 2004, status post right modified radical mastectomy with TRAM reconstruction for what proved to be a T1c N0, stage IA triple negative breast carcinoma.  Treated adjuvantly with paclitaxel and doxorubicin x4 in 2004.  Off treatment since August 2004 with no evidence of recurrence   (3) history of papillary thyroid cancer s/p thyroidectomy June 2007, s/p radioactive iodine July 2007   (4) status post left breast upper outer quadrant biopsy 08/21/2016 for a clinical T1b N0, stage IB invasive ductal carcinoma, grade 3, weakly estrogen receptor positive, progesterone receptor and HER-2 negative, with an MIB-1 of 80%.   (5) left lumpectomy and sentinel lymph node sampling 09/11/2016 found a pT1b pN0, stage IB invasive ductal carcinoma, grade 3, with negative margins.   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*) (a) there were no deleterious mutations in ALK, APC, ATM, AXIN2, BAP1, BARD1, BLM, BMPR1A, BRCA1, BRCA2, BRIP1, CASR, CDC73, CDH1, CDK4, CDKN1B, CDKN1C, CDKN2A, CEBPA, CHEK2, CTNNA1, DICER1, DIS3L2, EGFR, EPCAM, FH, FLCN, GATA2, GPC3, GREM1, HOXB13, HRAS, KIT, MAX, MEN1, MET, MITF, MLH1, MSH2, MSH3, MSH6, MUTYH, NBN, NF1, NF2, NTHL1, PALB2, PDGFRA, PHOX2B, PMS2, POLD1, POLE, POT1, PRKAR1A, PTCH1, PTEN, RAD50, RAD51C, RAD51D, RB1, RECQL4, RET, RUNX1, SDHA, SDHAF2, SDHB, SDHC, SDHD, SMAD4, SMARCA4, SMARCB1, SMARCE1, STK11, SUFU, TERC, TERT, TMEM127, TP53, TSC1, TSC2, VHL, WRN, WT1 (b) while homozygous mutations in the NTH L1 gene are associated with autosomal recessive polyposis, there is no evidence to suggest that a single mutation in this gene increases cancer risk.   (9) started anastrozole 03/14/2017 (a) bone density on 05/06/2017 showed a T score of -3.4--osteoporosis (b) started denosumab/Prolia June 2019, to be repeated every 6 months (C) repeat bone density on August 01, 2021 shows a T score of -3.3 in the left femoral neck.  CURRENT THERAPY: Anastrozole  INTERVAL HISTORY: [name] [name] 82 y.o. female returns for follow-up of her history of breast cancer.  Her most recent left breast screening mammogram occurred on August 30, 2022 demonstrating no mammographic evidence of malignancy and breast density category B.  She also undergoes bone density testing every 2 years and her most recent T-score was -3.3 in the left femoral neck on August 01, 2021.  She receives Prolia every 6 months with good tolerance.  She continues on vitamin D calcium and weightbearing exercises.  [name] [name]  tells me that she is doing well today.  She continues on anastrozole with good tolerance.  She denies any significant issues.  She tells me that she is somewhat concerned because she had a scan on her heart in March and has not heard about the results.  She wants to know if there is something wrong  with her heart and if everything is okay.  She could not remember who ordered this test.  She continues to exercise and she follows up with her primary care provider regularly.  Her next appointment with him is in 1 week.   Patient Active Problem List   Diagnosis Date Noted   GERD (gastroesophageal reflux disease) 03/05/2022   Dysphagia 03/05/2022   Low back pain 12/04/2021   Low back problem 12/04/2021   Gait disorder 12/04/2021   Palpitations 02/19/2021   DOE (dyspnea on exertion) 11/16/2020   Chronic sinusitis 11/16/2020   Upper respiratory infection 09/28/2020   Retinal artery occlusion, branch, right 08/09/2020   History of TIA (transient ischemic attack) 08/09/2020   Hyperparathyroidism (HCC) 06/13/2020   Skin lesion 03/21/2020   Cerebrovascular disease 03/21/2020   Pruritus 03/14/2020   Ataxia 07/20/2019   Seroma of breast 07/20/2019   Cerumen impaction 03/23/2019   Wrist pain 03/23/2019   Right ear pain 06/27/2018   Coronary artery disease 01/20/2018   Breast cyst, left 09/17/2017   Aortic atherosclerosis (HCC) 07/28/2017   Osteoporosis 05/07/2017   Genetic testing 12/05/2016   Family history of breast cancer    Port catheter in place 10/23/2016   Encounter for antineoplastic chemotherapy 10/23/2016   Malignant neoplasm of upper-outer quadrant of left breast in female, estrogen receptor positive (HCC) 07/29/2016   Well adult exam 06/05/2015   Neoplasm of uncertain behavior of skin 06/05/2015   Adenopathy, cervical 06/05/2015   Sinus tachycardia 06/07/2014   Wart viral 10/18/2011   Hyperglycemia 11/06/2010   Actinic keratoses 05/25/2010   Essential hypertension 02/23/2010   SHOULDER PAIN 11/22/2009   Chronic fatigue 11/08/2009   RLQ PAIN 11/08/2009   VERTIGO 07/11/2009   ECZEMA 10/19/2008   CT, CHEST, ABNORMAL 05/30/2008   STYE 02/17/2008   B12 deficiency 12/16/2007   Vitamin D deficiency 12/16/2007   HYPOTHYROIDISM, POSTSURGICAL 12/01/2007   Acute  maxillary sinusitis 11/11/2007   ALLERGIC RESPIRATORY DISEASE, EXTRINSIC 11/11/2007   THYROID CANCER 08/19/2007   Dyslipidemia 08/19/2007   Anxiety disorder 08/19/2007   Situational depression 08/19/2007   Allergic rhinitis 08/19/2007    is allergic to pneumococcal vaccines, sulfa antibiotics, sulfacetamide sodium-sulfur, sulfamethoxazole-trimethoprim, tape, and tegaderm ag mesh [silver].  MEDICAL HISTORY: 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)   HTN (hypertension)    Hyperlipidemia    Hypothyroidism    Personal history of chemotherapy 2002   Personal history of radiation therapy 2002   Psoriasis    S/P thyroidectomy 07/2005   2 cm largest diameter (1 other tiny focus)/ i-131 rx 99 mci 08/2005   Thyroid cancer (HCC)    Papillary Stage 1 - Dr Everardo All   Vitamin B12 deficiency 2009   Vitamin D deficiency 2009    SURGICAL HISTORY: Past Surgical History:  Procedure Laterality Date   BREAST LUMPECTOMY     BREAST LUMPECTOMY WITH RADIOACTIVE  SEED AND SENTINEL LYMPH NODE BIOPSY Left 09/11/2016   Procedure: LEFT BREAST LUMPECTOMY WITH RADIOACTIVE SEED AND LEFT AXILLARY SENTINEL LYMPH NODE BIOPSY WITH BLUE DYE INJECTION;  Surgeon: Claud Kelp, MD;  Location: [facility];  Service: General;  Laterality: Left;   IR FLUORO GUIDE PORT INSERTION RIGHT  09/13/2016   IR REMOVAL TUN ACCESS W/ PORT W/O FL MOD SED  12/30/2016   IR US GUIDE VASC ACCESS RIGHT  09/13/2016   MASTECTOMY Right    Right   PORTACATH PLACEMENT N/A 09/11/2016   Procedure: ATTEMPTED INSERTION PORT-A-CATH WITH ULTRA SOUND GUIDANCE;  Surgeon: Claud Kelp, MD;  Location:  SURGERY CENTER;  Service: General;  Laterality: N/A;   REDUCTION MAMMAPLASTY Left 2005    THYROIDECTOMY  2007    SOCIAL HISTORY: Social History   Socioeconomic History   Marital status: Married    Spouse name: Not on file   Number of children: 2   Years of education: Not on file   Highest education level: Not on file  Occupational History   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  Other Topics Concern   Not on file  Social History Narrative   GI - Dr Kinnie Scales   Depression - Dr Evelene Croon   GYN - Dr Kathyrn Sheriff      Social Determinants of Health   Financial Resource Strain: Low Risk  (06/21/2022)   Overall Financial Resource Strain (CARDIA)    Difficulty of Paying Living Expenses: Not hard at all  Food Insecurity: No Food Insecurity (06/21/2022)   Hunger Vital Sign    Worried About Radiation protection practitioner of Food in the Last Year: Never true    Ran Out of Food in the Last Year: Never true  Transportation Needs: No Transportation Needs (06/21/2022)   PRAPARE - Administrator, Civil Service (Medical): No    Lack of Transportation (Non-Medical): No  Physical Activity: Insufficiently Active (06/21/2022)   Exercise Vital Sign    Days of Exercise per Week: 7 days    Minutes of Exercise per Session: 20 min  Stress: No Stress Concern Present (06/21/2022)   [employer] of Occupational Health - Occupational Stress Questionnaire    Feeling of Stress : Not at all  Social Connections: Socially Integrated (06/21/2022)   Social Connection and Isolation Panel [NHANES]    Frequency of Communication with Friends and Family: More than three times a week    Frequency of Social Gatherings with Friends and Family: More than three times a week    Attends Religious Services: More than 4 times per year    Active Member of [employer] or  Organizations: Yes    Attends Banker Meetings: More than 4 times per year    Marital Status: Married  Catering manager Violence: Not At Risk (06/21/2022)   Humiliation, Afraid, Rape, and Kick questionnaire    Fear of Current or Ex-Partner: No    Emotionally Abused: No    Physically Abused: No    Sexually Abused: No    FAMILY HISTORY: Family History  Problem Relation Age of Onset   Stroke Mother    Allergies Mother    Asthma Mother  Clotting disorder Mother    Heart disease Father 84       MI   Allergies Sister    Asthma Sister    Breast cancer Sister 68       Lymphoma 22; currently 57   Lymphoma Sister    Hyperparathyroidism Sister    Asthma Brother    Leukemia Brother        dx 84s   Allergies Daughter    Asthma Daughter    Allergies Sister    Breast cancer Sister 41       currently 24   Kidney cancer Paternal Aunt        kidney ca; deceased 72   Liver cancer Paternal Uncle        unk. primary ("liver")   Throat cancer Maternal Grandmother        deceased 42   Lung cancer Maternal Grandfather        deceased 36   Pancreatic cancer Paternal Grandfather        deceased 33   Cancer Paternal Aunt        "abdominal"; deceased 79    Review of Systems  Constitutional:  Negative for appetite change, chills, fatigue, fever and unexpected weight change.  HENT:   Negative for hearing loss, lump/mass and trouble swallowing.   Eyes:  Negative for eye problems and icterus.  Respiratory:  Negative for chest tightness, cough and shortness of breath.   Cardiovascular:  Negative for chest pain, leg swelling and palpitations.  Gastrointestinal:  Negative for abdominal distention, abdominal pain, constipation, diarrhea, nausea and vomiting.  Endocrine: Negative for hot flashes.  Genitourinary:  Negative for difficulty urinating.   Musculoskeletal:  Negative for arthralgias.  Skin:  Negative for itching and rash.  Neurological:  Negative for dizziness, extremity  weakness, headaches and numbness.  Hematological:  Negative for adenopathy. Does not bruise/bleed easily.  Psychiatric/Behavioral:  Negative for depression. The patient is not nervous/anxious.       PHYSICAL EXAMINATION   Onc Performance Status - 09/06/22 1038       ECOG Perf Status   ECOG Perf Status Restricted in physically strenuous activity but ambulatory and able to carry out work of a light or sedentary nature, e.g., light house work, office work      KPS SCALE   KPS % SCORE Normal activity with effort, some s/s of disease             Vitals:   09/06/22 1032  BP: 127/72  Pulse: (!) 115  Resp: 18  Temp: 97.8 F (36.6 C)  SpO2: 100%    Physical Exam Constitutional:      General: She is not in acute distress.    Appearance: Normal appearance. She is not toxic-appearing.  HENT:     Head: Normocephalic and atraumatic.     Mouth/Throat:     Mouth: Mucous membranes are moist.     Pharynx: Oropharynx is clear. No oropharyngeal exudate or posterior oropharyngeal erythema.  Eyes:     General: No scleral icterus. Cardiovascular:     Rate and Rhythm: Normal rate and regular rhythm.     Pulses: Normal pulses.     Heart sounds: Normal heart sounds.  Pulmonary:     Effort: Pulmonary effort is normal.     Breath sounds: Normal breath sounds.  Chest:     Comments: Right breast status postmastectomy and reconstruction no sign of local recurrence left breast status post lumpectomy and radiation no sign of local  recurrence. Abdominal:     General: Abdomen is flat. Bowel sounds are normal. There is no distension.     Palpations: Abdomen is soft.     Tenderness: There is no abdominal tenderness.  Musculoskeletal:        General: No swelling.     Cervical back: Neck supple.  Lymphadenopathy:     Cervical: No cervical adenopathy.  Skin:    General: Skin is warm and dry.     Findings: No rash.  Neurological:     General: No focal deficit present.     Mental Status: She  is alert.  Psychiatric:        Mood and Affect: Mood normal.        Behavior: Behavior normal.     LABORATORY DATA:  CBC    Component Value Date/Time   WBC 6.2 09/06/2022 1012   WBC 4.9 06/20/2022 1327   RBC 3.86 (L) 09/06/2022 1012   HGB 10.6 (L) 09/06/2022 1012   HGB 12.2 08/10/2021 1948   HGB 9.6 (L) 12/11/2016 1017   HCT 33.1 (L) 09/06/2022 1012   HCT 37.8 08/10/2021 1948   HCT 28.5 (L) 12/11/2016 1017   PLT 293 09/06/2022 1012   PLT 395 08/10/2021 1948   MCV 85.8 09/06/2022 1012   MCV 91 08/10/2021 1948   MCV 99.5 12/11/2016 1017   MCH 27.5 09/06/2022 1012   MCHC 32.0 09/06/2022 1012   RDW 14.5 09/06/2022 1012   RDW 13.1 08/10/2021 1948   RDW 19.8 (H) 12/11/2016 1017   LYMPHSABS 2.2 09/06/2022 1012   LYMPHSABS 1.5 12/11/2016 1017   MONOABS 0.4 09/06/2022 1012   MONOABS 0.5 12/11/2016 1017   EOSABS 0.2 09/06/2022 1012   EOSABS 0.0 12/11/2016 1017   BASOSABS 0.0 09/06/2022 1012   BASOSABS 0.0 12/11/2016 1017    CMP     Component Value Date/Time   NA 142 09/06/2022 1012   NA 140 08/10/2021 1948   NA 139 12/11/2016 1017   K 4.1 09/06/2022 1012   K 3.7 12/11/2016 1017   CL 109 09/06/2022 1012   CL 104 10/09/2011 1403   CO2 27 09/06/2022 1012   CO2 22 12/11/2016 1017   GLUCOSE 138 (H) 09/06/2022 1012   GLUCOSE 122 12/11/2016 1017   GLUCOSE 113 (H) 10/09/2011 1403   BUN 22 09/06/2022 1012   BUN 21 08/10/2021 1948   BUN 16.9 12/11/2016 1017   CREATININE 1.30 (H) 09/06/2022 1012   CREATININE 1.74 (H) 10/20/2019 1634   CREATININE 1.2 (H) 12/11/2016 1017   CALCIUM 10.0 09/06/2022 1012   CALCIUM 9.0 12/11/2016 1017   PROT 6.6 09/06/2022 1012   PROT 6.9 08/10/2021 1948   PROT 7.4 12/11/2016 1017   ALBUMIN 3.9 09/06/2022 1012   ALBUMIN 4.1 08/10/2021 1948   ALBUMIN 4.0 12/11/2016 1017   AST 18 09/06/2022 1012   AST 65 (H) 12/11/2016 1017   ALT 11 09/06/2022 1012   ALT 51 12/11/2016 1017   ALKPHOS 75 09/06/2022 1012   ALKPHOS 139 12/11/2016 1017    BILITOT 0.4 09/06/2022 1012   BILITOT 0.42 12/11/2016 1017   GFRNONAA 41 (L) 09/06/2022 1012   GFRNONAA 27 (L) 10/20/2019 1634   GFRAA 32 (L) 10/20/2019 1634       ASSESSMENT and THERAPY PLAN:   Malignant neoplasm of upper-outer quadrant of left breast in female, estrogen receptor positive (HCC) [name] [name] is an 82 year old woman with stage Ib breast cancer of the left breast status postlumpectomy, adjuvant radiation and antiestrogen  therapy with anastrozole that began in 2019.  Stage Ia breast cancer: She continues on anastrozole with good tolerance.  Her next mammogram is due in July 2025.  We discussed stopping the anastrozole versus continuing 2 more years.  She would prefer to continue on it.  She verbalizes understanding about the bone loss risk.   Osteoporosis: She continues on Prolia given every 6 months.  She continues to take calcium and vitamin D and performs weightbearing exercises.  Her next bone density testing is due in June 2025.  She knows to call if she has any bone concerns. Cardiac CT results: I sent Dr. Anne Fu a message requesting their office reach out to her about her questions about her cardiac CT and cholesterol. Health maintenance: She will continue to follow-up with her primary care provider regularly.  We discussed healthy diet, limiting sweets, and exercise.  [name] [name] will return in 6 months for labs and her Prolia and again in a year for labs, follow-up, and her Prolia.    All questions were answered. The patient knows to call the clinic with any problems, questions or concerns. We can certainly see the patient much sooner if necessary.  Total encounter time:30 minutes*in face-to-face visit time, chart review, lab review, care coordination, order entry, and documentation of the encounter time.    Lillard Anes, NP 09/06/22 11:25 AM Medical Oncology and Hematology [facility] [address] [place], [place] [address] Tel. [phone]    Fax.  [phone]  *Total Encounter Time as defined by the Centers for Medicare and Medicaid Services includes, in addition to the face-to-face time of a patient visit (documented in the note above) non-face-to-face time: obtaining and reviewing outside history, ordering and reviewing medications, tests or procedures, care coordination (communications with other health care professionals or caregivers) and documentation in the medical record.

## 2022-09-06 NOTE — Assessment & Plan Note (Signed)
[name] [name] is an 82 year old woman with stage Ib breast cancer of the left breast status postlumpectomy, adjuvant radiation and antiestrogen therapy with anastrozole that began in 2019.  Stage Ia breast cancer: She continues on anastrozole with good tolerance.  Her next mammogram is due in July 2025.  We discussed stopping the anastrozole versus continuing 2 more years.  She would prefer to continue on it.  She verbalizes understanding about the bone loss risk.   Osteoporosis: She continues on Prolia given every 6 months.  She continues to take calcium and vitamin D and performs weightbearing exercises.  Her next bone density testing is due in June 2025.  She knows to call if she has any bone concerns. Cardiac CT results: I sent Dr. Anne Fu a message requesting their office reach out to her about her questions about her cardiac CT and cholesterol. Health maintenance: She will continue to follow-up with her primary care provider regularly.  We discussed healthy diet, limiting sweets, and exercise.  [name] [name] will return in 6 months for labs and her Prolia and again in a year for labs, follow-up, and her Prolia.

## 2022-09-09 ENCOUNTER — Telehealth: Payer: Self-pay | Admitting: Adult Health

## 2022-09-09 NOTE — Telephone Encounter (Signed)
Scheduled appointments per 7/26 los. Patient is aware of the made appointments.

## 2022-09-14 ENCOUNTER — Encounter: Payer: Self-pay | Admitting: Oncology

## 2022-09-17 ENCOUNTER — Ambulatory Visit: Payer: Medicare Other | Admitting: Internal Medicine

## 2022-09-19 ENCOUNTER — Inpatient Hospital Stay: Payer: Medicare HMO

## 2022-09-20 ENCOUNTER — Other Ambulatory Visit: Payer: Self-pay

## 2022-09-20 ENCOUNTER — Telehealth: Payer: Self-pay | Admitting: Adult Health

## 2022-09-20 ENCOUNTER — Inpatient Hospital Stay: Payer: Medicare HMO | Attending: Adult Health

## 2022-09-20 VITALS — BP 135/78 | HR 89 | Temp 97.7°F | Resp 18

## 2022-09-20 DIAGNOSIS — Z79811 Long term (current) use of aromatase inhibitors: Secondary | ICD-10-CM | POA: Diagnosis not present

## 2022-09-20 DIAGNOSIS — C50412 Malignant neoplasm of upper-outer quadrant of left female breast: Secondary | ICD-10-CM | POA: Diagnosis not present

## 2022-09-20 DIAGNOSIS — Z170 Estrogen receptor positive status [ER+]: Secondary | ICD-10-CM | POA: Diagnosis not present

## 2022-09-20 DIAGNOSIS — M810 Age-related osteoporosis without current pathological fracture: Secondary | ICD-10-CM | POA: Diagnosis not present

## 2022-09-20 DIAGNOSIS — Z95828 Presence of other vascular implants and grafts: Secondary | ICD-10-CM

## 2022-09-20 MED ORDER — DENOSUMAB 60 MG/ML ~~LOC~~ SOSY
60.0000 mg | PREFILLED_SYRINGE | Freq: Once | SUBCUTANEOUS | Status: AC
  Administered 2022-09-20: 60 mg via SUBCUTANEOUS
  Filled 2022-09-20: qty 1

## 2022-09-23 ENCOUNTER — Ambulatory Visit: Payer: Medicare HMO | Admitting: Internal Medicine

## 2022-10-02 ENCOUNTER — Ambulatory Visit: Payer: Medicare HMO | Admitting: Internal Medicine

## 2022-10-03 ENCOUNTER — Ambulatory Visit (INDEPENDENT_AMBULATORY_CARE_PROVIDER_SITE_OTHER): Payer: Medicare HMO | Admitting: Internal Medicine

## 2022-10-03 ENCOUNTER — Encounter: Payer: Self-pay | Admitting: Internal Medicine

## 2022-10-03 VITALS — BP 118/60 | HR 117 | Temp 98.6°F | Ht 64.0 in | Wt 143.0 lb

## 2022-10-03 DIAGNOSIS — L570 Actinic keratosis: Secondary | ICD-10-CM | POA: Diagnosis not present

## 2022-10-03 DIAGNOSIS — L299 Pruritus, unspecified: Secondary | ICD-10-CM

## 2022-10-03 DIAGNOSIS — E538 Deficiency of other specified B group vitamins: Secondary | ICD-10-CM | POA: Diagnosis not present

## 2022-10-03 DIAGNOSIS — F419 Anxiety disorder, unspecified: Secondary | ICD-10-CM

## 2022-10-03 DIAGNOSIS — R269 Unspecified abnormalities of gait and mobility: Secondary | ICD-10-CM

## 2022-10-03 DIAGNOSIS — E785 Hyperlipidemia, unspecified: Secondary | ICD-10-CM

## 2022-10-03 DIAGNOSIS — I10 Essential (primary) hypertension: Secondary | ICD-10-CM | POA: Diagnosis not present

## 2022-10-03 MED ORDER — CLOBETASOL PROPIONATE 0.05 % EX SOLN: 1.0000 | Freq: Two times a day (BID) | CUTANEOUS | 3 refills | Status: DC

## 2022-10-03 MED ORDER — ALPRAZOLAM 1 MG PO TABS: 1.0000 mg | ORAL_TABLET | Freq: Two times a day (BID) | ORAL | 2 refills | Status: DC | PRN

## 2022-10-03 NOTE — Progress Notes (Signed)
Subjective:  Patient ID: [name] [name], female    DOB: 1940/10/29  Age: 82 y.o. MRN: [id number]  CC: Follow-up (3 [facility] F/U)   HPI [name] [name] presents for CAD, anxiety, breast cancer C/o scalp itching all the time  Outpatient Medications Prior to Visit  Medication Sig Dispense Refill   anastrozole (ARIMIDEX) 1 MG tablet Take 1 tablet (1 mg total) by mouth daily. 90 tablet 1   cholecalciferol (VITAMIN D) 1000 units tablet Take 2 tablets (2,000 Units total) by mouth daily. 100 tablet 3   clopidogrel (PLAVIX) 75 MG tablet TAKE 1 TABLET (75 MG TOTAL) BY MOUTH DAILY FOR 21 DAYS. 90 tablet 3   guaiFENesin (MUCINEX) 600 MG 12 hr tablet Take 1 tablet (600 mg total) by mouth 2 (two) times daily. 60 tablet 1   levothyroxine (SYNTHROID) 112 MCG tablet Take 1 tablet (112 mcg total) by mouth daily. 90 tablet 3   liothyronine (CYTOMEL) 25 MCG tablet Take 0.5 tablets (12.5 mcg total) by mouth daily. 30 tablet 5   meclizine (ANTIVERT) 12.5 MG tablet TAKE 1 TABLET BY MOUTH 3 TIMES DAILY AS NEEDED FOR DIZZINESS. 60 tablet 1   metoprolol succinate (TOPROL-XL) 25 MG 24 hr tablet Take 0.5 tablets (12.5 mg total) by mouth at bedtime. 30 tablet 5   pantoprazole (PROTONIX) 40 MG tablet Take 1 tablet (40 mg total) by mouth daily. 90 tablet 3   rosuvastatin (CRESTOR) 20 MG tablet Take 1 tablet (20 mg total) by mouth daily. 90 tablet 1   telmisartan (MICARDIS) 20 MG tablet Take 1 tablet (20 mg total) by mouth daily. 90 tablet 3   venlafaxine XR (EFFEXOR-XR) 150 MG 24 hr capsule TAKE 1 CAPSULE BY MOUTH DAILY WITH BREAKFAST. 90 capsule 1   vitamin B-12 (CYANOCOBALAMIN) 1000 MCG tablet Take 1,000 mcg by mouth daily.     ALPRAZolam (XANAX) 1 MG tablet TAKE 1/2 TO 1 TABLET BY MOUTH 2 TIMES DAILY AS NEEDED FOR ANXIETY 60 tablet 0   metoprolol tartrate (LOPRESSOR) 50 MG tablet Take 1 tablet (50 mg total) by mouth as directed. Take one tablet 2 hours before CT 1 tablet 0   Facility-Administered Medications Prior to  Visit  Medication Dose Route Frequency Provider Last Rate Last Admin   denosumab (PROLIA) injection 60 mg  60 mg Subcutaneous Once Causey, Larna Daughters, NP        ROS: Review of Systems  Objective:  BP 118/60 (BP Location: Left Arm, Patient Position: Sitting, Cuff Size: Normal)   Pulse (!) 117   Temp 98.6 F (37 C) (Oral)   Ht 5\' 4"  (1.626 m)   Wt 143 lb (64.9 kg)   SpO2 95%   BMI 24.55 kg/m   BP Readings from Last 3 Encounters:  10/03/22 118/60  09/20/22 135/78  09/06/22 127/72    Wt Readings from Last 3 Encounters:  10/03/22 143 lb (64.9 kg)  09/06/22 140 lb 6.4 oz (63.7 kg)  06/21/22 139 lb (63 kg)    Physical Exam Ataxic as before AKs - face, scalp x3   Procedure Note :     Procedure : Cryosurgery   Indication:    Actinic keratosis(es)   Risks including unsuccessful procedure , bleeding, infection, bruising, scar, a need for a repeat  procedure and others were explained to the patient in detail as well as the benefits. Informed consent was obtained verbally.   3  lesion(s)  on  face, scalp  was/were treated with liquid nitrogen on a Q-tip  in a usual fasion . Band-Aid was applied and antibiotic ointment was given for a later use.   Tolerated well. Complications none.   Postprocedure instructions :     Keep the wounds clean. You can wash them with liquid soap and water. Pat dry with gauze or a Kleenex tissue  Before applying antibiotic ointment and a Band-Aid.   You need to report immediately  if  any signs of infection develop.   Lab Results  Component Value Date   WBC 6.2 09/06/2022   HGB 10.6 (L) 09/06/2022   HCT 33.1 (L) 09/06/2022   PLT 293 09/06/2022   GLUCOSE 138 (H) 09/06/2022   CHOL 134 08/10/2020   TRIG 263 (H) 08/10/2020   HDL 37 (L) 08/10/2020   LDLDIRECT 125.0 06/05/2015   LDLCALC 44 08/10/2020   ALT 11 09/06/2022   AST 18 09/06/2022   NA 142 09/06/2022   K 4.1 09/06/2022   CL 109 09/06/2022   CREATININE 1.30 (H) 09/06/2022    BUN 22 09/06/2022   CO2 27 09/06/2022   TSH 0.08 (L) 06/20/2022   INR 1.1 08/16/2021   HGBA1C 6.2 06/20/2022    MM 3D SCREENING MAMMOGRAM UNILATERAL LEFT BREAST  Result Date: 09/02/2022 CLINICAL DATA:  Screening. Status post left breast lumpectomy and right breast mastectomy EXAM: DIGITAL SCREENING UNILATERAL LEFT MAMMOGRAM WITH CAD AND TOMOSYNTHESIS TECHNIQUE: Left screening digital craniocaudal and mediolateral oblique mammograms were obtained. Left screening digital breast tomosynthesis was performed. The images were evaluated with computer-aided detection. COMPARISON:  Previous exam(s). ACR Breast Density Category b: There are scattered areas of fibroglandular density. FINDINGS: The patient is status post right breast mastectomy. Stable postsurgical changes in the left breast. There are no findings suspicious for malignancy. IMPRESSION: No mammographic evidence of malignancy. A result letter of this screening mammogram will be mailed directly to the patient. RECOMMENDATION: Screening mammogram in one year. (Code:SM-B-01Y) BI-RADS CATEGORY  2: Benign. Electronically Signed   By: Jacob Moores M.D.   On: 09/02/2022 14:46    Assessment & Plan:   Problem List Items Addressed This Visit     B12 deficiency - Primary    On B12      Dyslipidemia    Calcium CT score 902 which is 87 th percentile for age/sex Non obstructive CAD, mild stenosis on coronary CT On Plavix, Crestor, Toprol F/u w/Dr Anne Fu in 1 year      Anxiety disorder    Discussed Cont w/Xanax prn  Potential benefits of a long term benzodiazepines  use as well as potential risks  and complications were explained to the patient and were aknowledged.      Relevant Medications   ALPRAZolam (XANAX) 1 MG tablet   Essential hypertension    On Telmisartan at lower dose      Actinic keratoses    See cryo      Scalp itch    Clobetasol sol prn Rx      Gait disorder    Worse Start chair yoga PT offered and declined          Meds ordered this encounter  Medications   ALPRAZolam (XANAX) 1 MG tablet    Sig: Take 1 tablet (1 mg total) by mouth 2 (two) times daily as needed for anxiety.    Dispense:  60 tablet    Refill:  2    This request is for a new prescription for a controlled substance as required by Federal/State law..   clobetasol (TEMOVATE) 0.05 % external  solution    Sig: Apply 1 Application topically 2 (two) times daily.    Dispense:  50 mL    Refill:  3      Follow-up: Return in about 3 months (around 01/03/2023) for a follow-up visit.  Sonda Primes, MD

## 2022-10-03 NOTE — Assessment & Plan Note (Signed)
Calcium CT score 902 which is 87 th percentile for age/sex Non obstructive CAD, mild stenosis on coronary CT On Plavix, Crestor, Toprol F/u w/Dr Anne Fu in 1 year

## 2022-10-03 NOTE — Assessment & Plan Note (Signed)
See cryo 

## 2022-10-03 NOTE — Assessment & Plan Note (Signed)
On Telmisartan at lower dose

## 2022-10-03 NOTE — Assessment & Plan Note (Signed)
Clobetasol sol prn Rx

## 2022-10-03 NOTE — Assessment & Plan Note (Signed)
On B12 

## 2022-10-03 NOTE — Assessment & Plan Note (Signed)
Worse Start chair yoga PT offered and declined

## 2022-10-03 NOTE — Assessment & Plan Note (Signed)
Discussed Cont w/Xanax prn  Potential benefits of a long term benzodiazepines  use as well as potential risks  and complications were explained to the patient and were aknowledged.

## 2022-10-07 ENCOUNTER — Other Ambulatory Visit: Payer: Self-pay | Admitting: Internal Medicine

## 2022-10-28 ENCOUNTER — Ambulatory Visit
Admission: EM | Admit: 2022-10-28 | Discharge: 2022-10-28 | Disposition: A | Discharge status: Home / Self Care | Payer: Medicare HMO | Attending: Family Medicine | Admitting: Family Medicine

## 2022-10-28 DIAGNOSIS — R002 Palpitations: Secondary | ICD-10-CM | POA: Diagnosis not present

## 2022-10-28 DIAGNOSIS — R1030 Lower abdominal pain, unspecified: Secondary | ICD-10-CM | POA: Diagnosis not present

## 2022-10-28 LAB — POCT URINALYSIS DIP (MANUAL ENTRY)
Bilirubin, UA: NEGATIVE
Blood, UA: NEGATIVE
Glucose, UA: NEGATIVE mg/dL
Ketones, POC UA: NEGATIVE mg/dL
Leukocytes, UA: NEGATIVE
Nitrite, UA: NEGATIVE
Protein Ur, POC: NEGATIVE mg/dL
Spec Grav, UA: 1.025 (ref 1.010–1.025)
Urobilinogen, UA: 0.2 E.U./dL
pH, UA: 5.5 (ref 5.0–8.0)

## 2022-10-28 NOTE — ED Provider Notes (Signed)
[name]    CSN: [id number] Arrival date & time: 10/28/22  1514      History   Chief Complaint Chief Complaint  Patient presents with   Abdominal Pain    HPI [name] [name] is a 82 y.o. female.   Patient presents with lower abdominal "pressure" and frequent bowel movements that have been present for about 2 days.  Denies any pain but reports it is described as a pressure.  Reports frequent bowel movements but stool was formed.  Denies blood in stool.  Denies nausea, vomiting, fever, body aches, chills.  Denies any known sick contacts.  Denies any recent travel outside the [place].  She does not report any medications for symptoms.  Also reporting some "jitteriness in her chest" that she describes as feeling palpitations at times that have been present for multiple months.  She reports that she has not advised her PCP that the symptoms are occurring.  Denies chest pain or shortness of breath.  Denies dysuria or urinary frequency.   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)   HTN (hypertension)    Hyperlipidemia    Hypothyroidism    Personal history of chemotherapy 2002   Personal history of radiation therapy 2002   Psoriasis    S/P thyroidectomy 07/2005   2 cm largest diameter (1 other tiny focus)/ i-131 rx 99 [facility] 08/2005   Thyroid cancer (HCC)    Papillary Stage 1 - Dr Everardo All   Vitamin B12 deficiency 2009   Vitamin D deficiency 2009    Patient Active Problem List   Diagnosis Date Noted   GERD (gastroesophageal reflux disease) 03/05/2022   Dysphagia 03/05/2022   Low back pain 12/04/2021   Low back problem 12/04/2021   Gait disorder 12/04/2021   Palpitations 02/19/2021   DOE (dyspnea on  exertion) 11/16/2020   Chronic sinusitis 11/16/2020   Upper respiratory infection 09/28/2020   Retinal artery occlusion, branch, right 08/09/2020   History of TIA (transient ischemic attack) 08/09/2020   Hyperparathyroidism (HCC) 06/13/2020   Skin lesion 03/21/2020   Cerebrovascular disease 03/21/2020   Scalp itch 03/14/2020   Ataxia 07/20/2019   Seroma of breast 07/20/2019   Cerumen impaction 03/23/2019   Wrist pain 03/23/2019   Right ear pain 06/27/2018   Coronary artery disease 01/20/2018   Breast cyst, left 09/17/2017   Aortic atherosclerosis (HCC) 07/28/2017   Osteoporosis 05/07/2017   Genetic testing 12/05/2016   Family history of breast cancer    Port catheter in place 10/23/2016   Encounter for antineoplastic chemotherapy 10/23/2016   Malignant neoplasm of upper-outer quadrant of left breast in female, estrogen receptor positive (HCC) 07/29/2016   Well adult exam 06/05/2015   Neoplasm of uncertain behavior of skin 06/05/2015   Adenopathy, cervical 06/05/2015   Sinus tachycardia 06/07/2014   Wart viral 10/18/2011   Hyperglycemia 11/06/2010   Actinic keratoses 05/25/2010   Essential hypertension 02/23/2010   SHOULDER PAIN 11/22/2009   Chronic fatigue 11/08/2009   RLQ PAIN 11/08/2009   VERTIGO 07/11/2009   ECZEMA 10/19/2008   CT, CHEST, ABNORMAL 05/30/2008   STYE 02/17/2008   B12 deficiency 12/16/2007   Vitamin D deficiency 12/16/2007   HYPOTHYROIDISM, POSTSURGICAL  12/01/2007   Acute maxillary sinusitis 11/11/2007   ALLERGIC RESPIRATORY DISEASE, EXTRINSIC 11/11/2007   THYROID CANCER 08/19/2007   Dyslipidemia 08/19/2007   Anxiety disorder 08/19/2007   Situational depression 08/19/2007   Allergic rhinitis 08/19/2007    Past Surgical History:  Procedure Laterality Date   BREAST LUMPECTOMY     BREAST LUMPECTOMY WITH RADIOACTIVE SEED AND SENTINEL LYMPH NODE BIOPSY Left 09/11/2016   Procedure: LEFT BREAST LUMPECTOMY WITH RADIOACTIVE SEED AND LEFT AXILLARY SENTINEL  LYMPH NODE BIOPSY WITH BLUE DYE INJECTION;  Surgeon: Claud Kelp, MD;  Location: [facility];  Service: General;  Laterality: Left;   IR FLUORO GUIDE PORT INSERTION RIGHT  09/13/2016   IR REMOVAL TUN ACCESS W/ PORT W/O FL MOD SED  12/30/2016   IR US GUIDE VASC ACCESS RIGHT  09/13/2016   MASTECTOMY Right    Right   PORTACATH PLACEMENT N/A 09/11/2016   Procedure: ATTEMPTED INSERTION PORT-A-CATH WITH ULTRA SOUND GUIDANCE;  Surgeon: Claud Kelp, MD;  Location: [facility];  Service: General;  Laterality: N/A;   REDUCTION MAMMAPLASTY Left 2005   THYROIDECTOMY  2007    OB History   No obstetric history on file.      Home Medications    Prior to Admission medications   Medication Sig Start Date End Date Taking? Authorizing Provider  ALPRAZolam Prudy Feeler) 1 MG tablet Take 1 tablet (1 mg total) by mouth 2 (two) times daily as needed for anxiety. 10/03/22   Plotnikov, Georgina Quint, MD  anastrozole (ARIMIDEX) 1 MG tablet Take 1 tablet (1 mg total) by mouth daily. 02/26/22   Loa Socks, NP  cholecalciferol (VITAMIN D) 1000 units tablet Take 2 tablets (2,000 Units total) by mouth daily. 06/19/17   Plotnikov, Georgina Quint, MD  clobetasol (TEMOVATE) 0.05 % external solution Apply 1 Application topically 2 (two) times daily. 10/03/22   Plotnikov, Georgina Quint, MD  clopidogrel (PLAVIX) 75 MG tablet TAKE 1 TABLET (75 MG TOTAL) BY MOUTH DAILY FOR 21 DAYS. 11/05/21   Plotnikov, Georgina Quint, MD  guaiFENesin (MUCINEX) 600 MG 12 hr tablet Take 1 tablet (600 mg total) by mouth 2 (two) times daily. 08/27/21   Plotnikov, Georgina Quint, MD  levothyroxine (SYNTHROID) 112 MCG tablet Take 1 tablet (112 mcg total) by mouth daily. 01/31/22   Plotnikov, Georgina Quint, MD  liothyronine (CYTOMEL) 25 MCG tablet Take 0.5 tablets (12.5 mcg total) by mouth daily. 11/15/21   Plotnikov, Georgina Quint, MD  meclizine (ANTIVERT) 12.5 MG tablet TAKE 1 TABLET BY MOUTH 3 TIMES DAILY AS NEEDED FOR DIZZINESS. 08/26/22    Plotnikov, Georgina Quint, MD  metoprolol succinate (TOPROL-XL) 25 MG 24 hr tablet TAKE 0.5 TABLETS BY MOUTH AT BEDTIME. 10/07/22   Plotnikov, Georgina Quint, MD  pantoprazole (PROTONIX) 40 MG tablet Take 1 tablet (40 mg total) by mouth daily. 03/05/22   Plotnikov, Georgina Quint, MD  rosuvastatin (CRESTOR) 20 MG tablet Take 1 tablet (20 mg total) by mouth daily. 12/04/21   Plotnikov, Georgina Quint, MD  telmisartan (MICARDIS) 20 MG tablet Take 1 tablet (20 mg total) by mouth daily. 04/26/21   Plotnikov, Georgina Quint, MD  venlafaxine XR (EFFEXOR-XR) 150 MG 24 hr capsule TAKE 1 CAPSULE BY MOUTH DAILY WITH BREAKFAST. 07/29/22   Plotnikov, Georgina Quint, MD  vitamin B-12 (CYANOCOBALAMIN) 1000 MCG tablet Take 1,000 mcg by mouth daily.    [provider]    Family History Family History  Problem Relation Age of Onset   Stroke Mother    Allergies  Mother    Asthma Mother    Clotting disorder Mother    Heart disease Father 67       MI   Allergies Sister    Asthma Sister    Breast cancer Sister 64       Lymphoma 12; currently 75   Lymphoma Sister    Hyperparathyroidism Sister    Asthma Brother    Leukemia Brother        dx 34s   Allergies Daughter    Asthma Daughter    Allergies Sister    Breast cancer Sister 60       currently 11   Kidney cancer Paternal Aunt        kidney ca; deceased 35   Liver cancer Paternal Uncle        unk. primary ("liver")   Throat cancer Maternal Grandmother        deceased 17   Lung cancer Maternal Grandfather        deceased 72   Pancreatic cancer Paternal Grandfather        deceased 58   Cancer Paternal Aunt        "abdominal"; deceased 70    Social History Social History   Tobacco Use   Smoking status: Never   Smokeless tobacco: Never  Vaping Use   Vaping status: Never Used  Substance Use Topics   Alcohol use: No   Drug use: No     Allergies   Pneumococcal vaccines, Sulfa antibiotics, Sulfacetamide sodium-sulfur, Sulfamethoxazole-trimethoprim, Tape, and  Tegaderm ag mesh [silver]   Review of Systems Review of Systems Per HPI  Physical Exam Triage Vital Signs ED Triage Vitals  Encounter Vitals Group     BP 10/28/22 1643 135/77     Systolic BP Percentile --      Diastolic BP Percentile --      Pulse Rate 10/28/22 1643 77     Resp 10/28/22 1643 16     Temp 10/28/22 1643 98.4 F (36.9 C)     Temp src --      SpO2 10/28/22 1643 98 %     Weight 10/28/22 1644 139 lb (63 kg)     Height 10/28/22 1644 5\' 4"  (1.626 m)     Head Circumference --      Peak Flow --      Pain Score 10/28/22 1646 9     Pain Loc --      Pain Education --      Exclude from Growth Chart --    No data found.  Updated Vital Signs BP 135/77 (BP Location: Left Arm)   Pulse 77   Temp 98.4 F (36.9 C)   Resp 16   Ht 5\' 4"  (1.626 m)   Wt 139 lb (63 kg)   SpO2 98%   BMI 23.86 kg/m   Visual Acuity Right Eye Distance:   Left Eye Distance:   Bilateral Distance:    Right Eye Near:   Left Eye Near:    Bilateral Near:     Physical Exam Constitutional:      General: She is not in acute distress.    Appearance: Normal appearance. She is not toxic-appearing or diaphoretic.  HENT:     Head: Normocephalic and atraumatic.  Eyes:     Extraocular Movements: Extraocular movements intact.     Conjunctiva/sclera: Conjunctivae normal.  Cardiovascular:     Rate and Rhythm: Normal rate and regular rhythm.     Pulses: Normal pulses.  Heart sounds: Normal heart sounds.  Pulmonary:     Effort: Pulmonary effort is normal. No respiratory distress.     Breath sounds: Normal breath sounds.  Abdominal:     General: Bowel sounds are normal. There is no distension.     Palpations: Abdomen is soft.     Tenderness: There is no abdominal tenderness.  Neurological:     General: No focal deficit present.     Mental Status: She is alert and oriented to person, place, and time. Mental status is at baseline.  Psychiatric:        Mood and Affect: Mood normal.         Behavior: Behavior normal.        Thought Content: Thought content normal.        Judgment: Judgment normal.      UC Treatments / Results  Labs (all labs ordered are listed, but only abnormal results are displayed) Labs Reviewed  CBC  COMPREHENSIVE METABOLIC PANEL  POCT URINALYSIS DIP (MANUAL ENTRY)    EKG   Radiology No results found.  Procedures Procedures (including critical care time)  Medications Ordered in UC Medications - No data to display  Initial Impression / Assessment and Plan / UC Course  I have reviewed the triage vital signs and the nursing notes.  Pertinent labs & imaging results that were available during my care of the patient were reviewed by me and considered in my medical decision making (see chart for details).     Patient's chief complaint is lower abdominal "pressure".  UA is unremarkable.  Vital signs are stable and patient is sitting upright in chair, in no acute distress, and is not guarding abdomen so do not think that emergent evaluation or imaging is necessary.  Although, discussed with patient that if symptoms persist or worsen, she is to go to go straight to the emergency department for further evaluation and management.  I suspect possible viral cause of symptoms causing frequent bowel movements and lower abdominal discomfort.  Advised adequate fluid hydration and bland diet.  Will obtain CMP and CBC.  Awaiting results.  Patient reporting "jitteriness in her chest" for a few months intermittently.  Given this has been present for multiple months, and vital signs are stable, do not think that emergent evaluation is necessary for this either.  EKG completed that did not show any acute abnormality.  CMP and CBC pending to check electrolyte abnormalities.  Patient does have established cardiologist so encouraged her to follow-up with them for further evaluation as she may need to wear a heart monitor to determine cause of sensation.  Advised patient if  she develops worsening symptoms or chest pain, she is to go straight to the emergency department.  Also advised PCP follow-up.  Patient verbalized understanding and was agreeable with plan. Final Clinical Impressions(s) / UC Diagnoses   Final diagnoses:  Lower abdominal pain  Palpitations     Discharge Instructions      Follow up with PCP and cardiologist for the jitteriness that you are feeling in your chest. Bloodwork is pending. We will call if it abnormal. Go to the emergency department if symptoms persist or worsen.      ED Prescriptions   None    PDMP not reviewed this encounter.   Gustavus Bryant, [place] 10/28/22 1758

## 2022-10-28 NOTE — Discharge Instructions (Addendum)
Follow up with PCP and cardiologist for the jitteriness that you are feeling in your chest. Bloodwork is pending. We will call if it abnormal. Go to the emergency department if symptoms persist or worsen.

## 2022-10-28 NOTE — ED Triage Notes (Signed)
Patient here today with c/o lower abd pain (pressure) X 2 days. Patient states that she has noticed frequent BM after eating.

## 2022-10-29 LAB — CBC
Hematocrit: 35.3 % (ref 34.0–46.6)
Hemoglobin: 10.8 g/dL — ABNORMAL LOW (ref 11.1–15.9)
MCH: 25.8 pg — ABNORMAL LOW (ref 26.6–33.0)
MCHC: 30.6 g/dL — ABNORMAL LOW (ref 31.5–35.7)
MCV: 84 fL (ref 79–97)
Platelets: 389 10*3/uL (ref 150–450)
RBC: 4.19 x10E6/uL (ref 3.77–5.28)
RDW: 14 % (ref 11.7–15.4)
WBC: 6.5 10*3/uL (ref 3.4–10.8)

## 2022-10-29 LAB — COMPREHENSIVE METABOLIC PANEL
ALT: 13 IU/L (ref 0–32)
AST: 23 IU/L (ref 0–40)
Albumin: 4.5 g/dL (ref 3.7–4.7)
Alkaline Phosphatase: 88 IU/L (ref 44–121)
BUN/Creatinine Ratio: 12 (ref 12–28)
BUN: 13 mg/dL (ref 8–27)
Bilirubin Total: 0.3 mg/dL (ref 0.0–1.2)
CO2: 20 mmol/L (ref 20–29)
Calcium: 9.6 mg/dL (ref 8.7–10.3)
Chloride: 104 mmol/L (ref 96–106)
Creatinine, Ser: 1.07 mg/dL — ABNORMAL HIGH (ref 0.57–1.00)
Globulin, Total: 2.8 g/dL (ref 1.5–4.5)
Glucose: 105 mg/dL — ABNORMAL HIGH (ref 70–99)
Potassium: 4.6 mmol/L (ref 3.5–5.2)
Sodium: 140 mmol/L (ref 134–144)
Total Protein: 7.3 g/dL (ref 6.0–8.5)
eGFR: 52 mL/min/{1.73_m2} — ABNORMAL LOW (ref >59)

## 2022-11-17 ENCOUNTER — Other Ambulatory Visit: Payer: Self-pay | Admitting: Internal Medicine

## 2022-11-21 ENCOUNTER — Other Ambulatory Visit: Payer: Self-pay | Admitting: Internal Medicine

## 2022-11-26 ENCOUNTER — Other Ambulatory Visit: Payer: Self-pay | Admitting: Adult Health

## 2022-11-27 ENCOUNTER — Other Ambulatory Visit: Payer: Self-pay | Admitting: Internal Medicine

## 2022-11-27 ENCOUNTER — Telehealth: Payer: Self-pay | Admitting: Internal Medicine

## 2022-11-27 NOTE — Telephone Encounter (Signed)
Patient wants someone to call and discuss her blood pressure medication - she is confused about her medication.  Phone:  [phone]

## 2022-11-29 NOTE — Telephone Encounter (Signed)
LVM for pt to call the office back to discuss medication questions and confusion.

## 2022-12-04 ENCOUNTER — Telehealth: Payer: Self-pay | Admitting: Internal Medicine

## 2022-12-04 NOTE — Telephone Encounter (Signed)
Prescription Request  12/04/2022  LOV: 10/03/2022  What is the name of the medication or equipment? Metoprolol, propanolol, cytomel  Have you contacted your pharmacy to request a refill? Yes   Which pharmacy would you like this sent to?  [id number] [place], [place] - [address] [place] [address] Phone: [phone] Fax: [phone]     Patient notified that their request is being sent to the clinical staff for review and that they should receive a response within 2 business days.   Please advise at Mobile [phone] (mobile)

## 2022-12-05 ENCOUNTER — Other Ambulatory Visit: Payer: Self-pay

## 2022-12-05 MED ORDER — METOPROLOL SUCCINATE ER 25 MG PO TB24: 25.0000 mg | ORAL_TABLET | Freq: Every day | ORAL | 3 refills | Status: DC

## 2022-12-05 MED ORDER — LIOTHYRONINE SODIUM 25 MCG PO TABS: 12.5000 ug | ORAL_TABLET | Freq: Every day | ORAL | 3 refills | Status: DC

## 2022-12-05 NOTE — Telephone Encounter (Signed)
Medication has been sent in 

## 2022-12-18 ENCOUNTER — Ambulatory Visit: Payer: Medicare HMO | Admitting: Internal Medicine

## 2022-12-18 ENCOUNTER — Encounter: Payer: Self-pay | Admitting: Internal Medicine

## 2022-12-18 VITALS — BP 120/80 | HR 78 | Temp 98.2°F | Ht 64.0 in | Wt 147.0 lb

## 2022-12-18 DIAGNOSIS — I679 Cerebrovascular disease, unspecified: Secondary | ICD-10-CM

## 2022-12-18 DIAGNOSIS — E890 Postprocedural hypothyroidism: Secondary | ICD-10-CM | POA: Diagnosis not present

## 2022-12-18 DIAGNOSIS — R1313 Dysphagia, pharyngeal phase: Secondary | ICD-10-CM

## 2022-12-18 DIAGNOSIS — E559 Vitamin D deficiency, unspecified: Secondary | ICD-10-CM

## 2022-12-18 DIAGNOSIS — I25119 Atherosclerotic heart disease of native coronary artery with unspecified angina pectoris: Secondary | ICD-10-CM

## 2022-12-18 DIAGNOSIS — R194 Change in bowel habit: Secondary | ICD-10-CM

## 2022-12-18 DIAGNOSIS — L570 Actinic keratosis: Secondary | ICD-10-CM | POA: Diagnosis not present

## 2022-12-18 DIAGNOSIS — E538 Deficiency of other specified B group vitamins: Secondary | ICD-10-CM | POA: Diagnosis not present

## 2022-12-18 MED ORDER — PANTOPRAZOLE SODIUM 40 MG PO TBEC: 40.0000 mg | DELAYED_RELEASE_TABLET | Freq: Two times a day (BID) | ORAL | 3 refills | Status: DC

## 2022-12-18 NOTE — Assessment & Plan Note (Signed)
Non obstructive CAD, mild stenosis. On Plavix, Crestor, Toprol F/u w/Dr Anne Fu

## 2022-12-18 NOTE — Assessment & Plan Note (Addendum)
Continue w/Protonix or re-start GI ref - Dr Tomasa Rand

## 2022-12-18 NOTE — Assessment & Plan Note (Signed)
On Vit D 

## 2022-12-18 NOTE — Assessment & Plan Note (Signed)
On B12 

## 2022-12-18 NOTE — Progress Notes (Signed)
Subjective:  Patient ID: [name] [name], female    DOB: Jan 02, 1941  Age: 82 y.o. MRN: [id number]  CC: Medical Management of Chronic Issues (3 mnth f/u, Pt is having issues with walking and her balance keeps falling forward when walking... Pt states she has had sore throat x2 weeks and having issues when eating or drinking due to pain in rt side of abdomen. )   HPI [name] [name] presents for 3 mnth f/u, Pt is having issues with walking and her balance keeps falling forward when walking... Pt states she has had sore throat and having issues when eating or drinking due to pain in rt side of abdomen x weeks C/o worsening constipation...  Outpatient Medications Prior to Visit  Medication Sig Dispense Refill   ALPRAZolam (XANAX) 1 MG tablet Take 1 tablet (1 mg total) by mouth 2 (two) times daily as needed for anxiety. 60 tablet 2   anastrozole (ARIMIDEX) 1 MG tablet TAKE 1 TABLET BY MOUTH EVERY DAY 90 tablet 1   cholecalciferol (VITAMIN D) 1000 units tablet Take 2 tablets (2,000 Units total) by mouth daily. 100 tablet 3   clobetasol (TEMOVATE) 0.05 % external solution Apply 1 Application topically 2 (two) times daily. 50 mL 3   clopidogrel (PLAVIX) 75 MG tablet TAKE 1 TABLET (75 MG TOTAL) BY MOUTH DAILY FOR 21 DAYS. 90 tablet 3   guaiFENesin (MUCINEX) 600 MG 12 hr tablet Take 1 tablet (600 mg total) by mouth 2 (two) times daily. 60 tablet 1   levothyroxine (SYNTHROID) 112 MCG tablet TAKE 1 TABLET BY MOUTH EVERY DAY 90 tablet 3   liothyronine (CYTOMEL) 25 MCG tablet Take 0.5 tablets (12.5 mcg total) by mouth daily. 45 tablet 3   meclizine (ANTIVERT) 12.5 MG tablet TAKE 1 TABLET BY MOUTH 3 TIMES DAILY AS NEEDED FOR DIZZINESS. 60 tablet 1   metoprolol succinate (TOPROL-XL) 25 MG 24 hr tablet Take 1 tablet (25 mg total) by mouth daily. 45 tablet 3   rosuvastatin (CRESTOR) 20 MG tablet TAKE 1 TABLET BY MOUTH EVERY DAY 90 tablet 1   telmisartan (MICARDIS) 20 MG tablet Take 1 tablet (20 mg total) by  mouth daily. 90 tablet 3   venlafaxine XR (EFFEXOR-XR) 150 MG 24 hr capsule TAKE 1 CAPSULE BY MOUTH DAILY WITH BREAKFAST. 90 capsule 1   vitamin B-12 (CYANOCOBALAMIN) 1000 MCG tablet Take 1,000 mcg by mouth daily.     pantoprazole (PROTONIX) 40 MG tablet Take 1 tablet (40 mg total) by mouth daily. 90 tablet 3   Facility-Administered Medications Prior to Visit  Medication Dose Route Frequency Provider Last Rate Last Admin   denosumab (PROLIA) injection 60 mg  60 mg Subcutaneous Once Causey, Larna Daughters, NP        ROS: Review of Systems  Constitutional:  Negative for activity change, appetite change, chills, fatigue and unexpected weight change.  HENT:  Positive for congestion. Negative for mouth sores and sinus pressure.   Eyes:  Negative for visual disturbance.  Respiratory:  Positive for cough. Negative for chest tightness.   Gastrointestinal:  Positive for abdominal distention and constipation. Negative for abdominal pain and nausea.  Genitourinary:  Negative for difficulty urinating, frequency and vaginal pain.  Musculoskeletal:  Positive for arthralgias and gait problem. Negative for back pain.  Skin:  Negative for pallor and rash.  Neurological:  Positive for weakness. Negative for dizziness, tremors, numbness and headaches.  Hematological:  Bruises/bleeds easily.  Psychiatric/Behavioral:  Positive for decreased concentration and dysphoric mood. Negative  for confusion, sleep disturbance and suicidal ideas. The patient is nervous/anxious.     Objective:  BP 120/80 (BP Location: Left Arm, Patient Position: Sitting, Cuff Size: Normal)   Pulse 78   Temp 98.2 F (36.8 C) (Oral)   Ht 5\' 4"  (1.626 m)   Wt 147 lb (66.7 kg)   SpO2 95%   BMI 25.23 kg/m   BP Readings from Last 3 Encounters:  12/30/22 118/81  12/30/22 106/62  12/18/22 120/80    Wt Readings from Last 3 Encounters:  12/30/22 149 lb (67.6 kg)  12/30/22 148 lb (67.1 kg)  12/18/22 147 lb (66.7 kg)    Physical  Exam Constitutional:      General: She is not in acute distress.    Appearance: She is well-developed. She is obese.  HENT:     Head: Normocephalic.     Right Ear: External ear normal.     Left Ear: External ear normal.     Nose: Nose normal.  Eyes:     General:        Right eye: No discharge.        Left eye: No discharge.     Conjunctiva/sclera: Conjunctivae normal.     Pupils: Pupils are equal, round, and reactive to light.  Neck:     Thyroid: No thyromegaly.     Vascular: No JVD.     Trachea: No tracheal deviation.  Cardiovascular:     Rate and Rhythm: Normal rate and regular rhythm.     Heart sounds: Normal heart sounds.  Pulmonary:     Effort: No respiratory distress.     Breath sounds: No stridor. No wheezing or rales.  Abdominal:     General: Bowel sounds are normal. There is no distension.     Palpations: Abdomen is soft. There is no mass.     Tenderness: There is no abdominal tenderness. There is no guarding or rebound.  Musculoskeletal:        General: No tenderness.     Cervical back: Normal range of motion and neck supple. No rigidity.     Right lower leg: No edema.     Left lower leg: No edema.  Lymphadenopathy:     Cervical: No cervical adenopathy.  Skin:    Findings: No erythema or rash.  Neurological:     Mental Status: She is oriented to person, place, and time.     Cranial Nerves: No cranial nerve deficit.     Motor: No abnormal muscle tone.     Coordination: Coordination abnormal.     Gait: Gait abnormal.     Deep Tendon Reflexes: Reflexes normal.  Psychiatric:        Behavior: Behavior normal.        Thought Content: Thought content normal.        Judgment: Judgment normal.   AKs on chest   Procedure Note :     Procedure : Cryosurgery   Indication:  Actinic keratosis(es)   Risks including unsuccessful procedure , bleeding, infection, bruising, scar, a need for a repeat  procedure and others were explained to the patient in detail as well  as the benefits. Informed consent was obtained verbally.    6 lesion(s)  on  chest, hands  was/were treated with liquid nitrogen on a Q-tip in a usual fasion . Band-Aid was applied and antibiotic ointment was given for a later use.   Tolerated well. Complications none.   Postprocedure instructions :  Keep the wounds clean. You can wash them with liquid soap and water. Pat dry with gauze or a Kleenex tissue  Before applying antibiotic ointment and a Band-Aid.   You need to report immediately  if  any signs of infection develop.      A total time of 45 minutes (excluding the procedure) was spent preparing to see the patient, reviewing tests, x-rays, operative reports and other medical records.  Also, obtaining history and performing comprehensive physical exam.  Additionally, counseling the patient regarding the above listed issues.   Finally, documenting clinical information in the health records, coordination of care, educating the patient.    Lab Results  Component Value Date   WBC 6.5 10/28/2022   HGB 10.8 (L) 10/28/2022   HCT 35.3 10/28/2022   PLT 389 10/28/2022   GLUCOSE 105 (H) 10/28/2022   CHOL 134 08/10/2020   TRIG 263 (H) 08/10/2020   HDL 37 (L) 08/10/2020   LDLDIRECT 125.0 06/05/2015   LDLCALC 44 08/10/2020   ALT 13 10/28/2022   AST 23 10/28/2022   NA 140 10/28/2022   K 4.6 10/28/2022   CL 104 10/28/2022   CREATININE 1.07 (H) 10/28/2022   BUN 13 10/28/2022   CO2 20 10/28/2022   TSH 0.08 (L) 06/20/2022   INR 1.1 08/16/2021   HGBA1C 6.2 06/20/2022    No results found.  Assessment & Plan:   Problem List Items Addressed This Visit     HYPOTHYROIDISM, POSTSURGICAL - Primary    Continue Levoxyl and Cytomel. Labs are OK      B12 deficiency    On B12      Vitamin D deficiency    On Vit D      Actinic keratoses    See procedure      Coronary artery disease    Non obstructive CAD, mild stenosis. On Plavix, Crestor, Toprol F/u w/Dr Anne Fu       Cerebrovascular disease    Cont on Crestor, Plavix po      Dysphagia    Continue w/Protonix or re-start GI ref - Dr Tomasa Rand      Relevant Orders   Ambulatory referral to Gastroenterology   Bowel habit changes    Worse GI ref - Dr Tomasa Rand Miralax 4 scoops in water prn to clean up bowels prn      Relevant Orders   Ambulatory referral to Gastroenterology      Meds ordered this encounter  Medications   pantoprazole (PROTONIX) 40 MG tablet    Sig: Take 1 tablet (40 mg total) by mouth 2 (two) times daily.    Dispense:  180 tablet    Refill:  3      Follow-up: Return in about 3 months (around 03/20/2023) for a follow-up visit.  Sonda Primes, MD

## 2022-12-18 NOTE — Assessment & Plan Note (Signed)
Cont on Crestor, Plavix po

## 2022-12-18 NOTE — Assessment & Plan Note (Addendum)
Worse GI ref - Dr Tomasa Rand Miralax 4 scoops in water prn to clean up bowels prn

## 2022-12-18 NOTE — Assessment & Plan Note (Signed)
Continue Levoxyl and Cytomel. Labs are OK

## 2022-12-30 ENCOUNTER — Encounter: Payer: Self-pay | Admitting: Internal Medicine

## 2022-12-30 ENCOUNTER — Ambulatory Visit (INDEPENDENT_AMBULATORY_CARE_PROVIDER_SITE_OTHER): Payer: Medicare HMO | Admitting: Internal Medicine

## 2022-12-30 ENCOUNTER — Ambulatory Visit: Payer: Medicare HMO | Admitting: Gastroenterology

## 2022-12-30 ENCOUNTER — Encounter: Payer: Self-pay | Admitting: Gastroenterology

## 2022-12-30 VITALS — BP 118/81 | HR 96 | Temp 98.2°F | Ht 64.0 in | Wt 149.0 lb

## 2022-12-30 VITALS — BP 106/62 | Wt 148.0 lb

## 2022-12-30 DIAGNOSIS — R269 Unspecified abnormalities of gait and mobility: Secondary | ICD-10-CM | POA: Diagnosis not present

## 2022-12-30 DIAGNOSIS — R1310 Dysphagia, unspecified: Secondary | ICD-10-CM | POA: Diagnosis not present

## 2022-12-30 DIAGNOSIS — E785 Hyperlipidemia, unspecified: Secondary | ICD-10-CM

## 2022-12-30 DIAGNOSIS — R42 Dizziness and giddiness: Secondary | ICD-10-CM | POA: Diagnosis not present

## 2022-12-30 DIAGNOSIS — R194 Change in bowel habit: Secondary | ICD-10-CM

## 2022-12-30 DIAGNOSIS — J0100 Acute maxillary sinusitis, unspecified: Secondary | ICD-10-CM

## 2022-12-30 DIAGNOSIS — N390 Urinary tract infection, site not specified: Secondary | ICD-10-CM | POA: Diagnosis not present

## 2022-12-30 DIAGNOSIS — R300 Dysuria: Secondary | ICD-10-CM | POA: Diagnosis not present

## 2022-12-30 DIAGNOSIS — R1031 Right lower quadrant pain: Secondary | ICD-10-CM | POA: Diagnosis not present

## 2022-12-30 DIAGNOSIS — I10 Essential (primary) hypertension: Secondary | ICD-10-CM

## 2022-12-30 MED ORDER — CEFUROXIME AXETIL 250 MG PO TABS: 250.0000 mg | ORAL_TABLET | Freq: Two times a day (BID) | ORAL | 0 refills | Status: AC

## 2022-12-30 NOTE — Assessment & Plan Note (Signed)
On Telmisartan at lower dose

## 2022-12-30 NOTE — Progress Notes (Signed)
Chief Complaint: RLQ pain and dysphagia Primary GI MD: Dr. Orvan Falconer  HPI: 82 year old female history of breast cancer s/p chemo and radiation, papillary thyroid cancer s/p thyroidectomy, hypertension, hyperlipidemia, presents for evaluation of RLQ pain and dysphagia  Last seen in our office in 2020 by Dr. Orvan Falconer and at that time she was reporting pain with eating ongoing since she turned 63 and constipation.  Patient underwent colonoscopy March 2020 for change in bowel habits which showed stool in the entire examined colon.  No specimens collected no repeat planned  CT chest abdomen pelvis with contrast for unintentional weight loss January 2024 showed no findings of recurrent or metastatic disease.  Diverticulosis without diverticulitis.  Small liver cyst stable compared to CT scan in 2019.  Gallbladder unremarkable.  Recent labs 10/28/2022 Hgb 10.8, MCV 84 (Hgb 12.5 10 months ago) CMP with chronic kidney disease (creatinine 1.07 and GFR 52).  BUN 13  Recently seen in emergency department 10/28/2022 for lower abdominal pain x 2 days and "jitteriness in her chest."  Patient was instructed to follow-up with cardiologist and primary care.  No imaging was done.   The patient presents with right lower quadrant abdominal pain. She reports that the pain was severe and constant, but has since improved significantly. She attributes the improvement to discontinuing daily consumption of raw baby carrots, which she believes were causing the pain.   She also reports a new onset of urinary symptoms, including dysuria, which started a few days ago. She suspects a urinary tract infection and has self-medicated with amoxicillin, which has not alleviated the symptoms.  In addition to the abdominal pain and urinary symptoms, the patient reports trouble swallowing, which has been ongoing for several weeks. She describes a sensation of food getting stuck in the suprasternal notch, particularly with solid foods.  She denies any issues with liquids. This occurs a few times a week, but not daily. She also reports a sore throat and frequent throat clearing for the same duration. Denies GERD.  The patient also mentions an increase in bowel movements, which are not always formed. She has been taking Metamucil, which has improved the consistency of her stools.   She also reports dizziness and wobbliness, which she attributes to an inner ear problem and reports constant "clogging" of her right ear  Denies weight loss, hematochezia, melena, nausea, and vomiting.     PREVIOUS GI WORKUP   Colonoscopy 04/2018 for change in bowel habits - Stool in the entire examined colon. This limited a meaningful evaluation for both small and large polyps.  - The examination was otherwise normal on direct and retroflexion views.  - No specimens collected.  - No source recent symptoms identified on this examination.  Discussed the use of AI scribe software for clinical note transcription with the patient, who gave verbal consent to proceed.  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)   HTN (hypertension)    Hyperlipidemia    Hypothyroidism    Personal history of chemotherapy 2002   Personal history of radiation therapy 2002   Psoriasis    S/P thyroidectomy 07/2005   2 cm largest diameter (1 other tiny focus)/ i-131 rx 99 [facility] 08/2005   Thyroid cancer (HCC)  Papillary Stage 1 - Dr Everardo All   Vitamin B12 deficiency 2009   Vitamin D deficiency 2009    Past Surgical History:  Procedure Laterality Date   BREAST LUMPECTOMY     BREAST LUMPECTOMY WITH RADIOACTIVE SEED AND SENTINEL LYMPH NODE BIOPSY Left 09/11/2016   Procedure: LEFT BREAST LUMPECTOMY WITH RADIOACTIVE SEED AND LEFT  AXILLARY SENTINEL LYMPH NODE BIOPSY WITH BLUE DYE INJECTION;  Surgeon: Claud Kelp, MD;  Location: [facility];  Service: General;  Laterality: Left;   IR FLUORO GUIDE PORT INSERTION RIGHT  09/13/2016   IR REMOVAL TUN ACCESS W/ PORT W/O FL MOD SED  12/30/2016   IR US GUIDE VASC ACCESS RIGHT  09/13/2016   MASTECTOMY Right    Right   PORTACATH PLACEMENT N/A 09/11/2016   Procedure: ATTEMPTED INSERTION PORT-A-CATH WITH ULTRA SOUND GUIDANCE;  Surgeon: Claud Kelp, MD;  Location: [facility];  Service: General;  Laterality: N/A;   REDUCTION MAMMAPLASTY Left 2005   THYROIDECTOMY  2007    Current Outpatient Medications  Medication Sig Dispense Refill   ALPRAZolam (XANAX) 1 MG tablet Take 1 tablet (1 mg total) by mouth 2 (two) times daily as needed for anxiety. 60 tablet 2   anastrozole (ARIMIDEX) 1 MG tablet TAKE 1 TABLET BY MOUTH EVERY DAY 90 tablet 1   cholecalciferol (VITAMIN D) 1000 units tablet Take 2 tablets (2,000 Units total) by mouth daily. 100 tablet 3   clobetasol (TEMOVATE) 0.05 % external solution Apply 1 Application topically 2 (two) times daily. 50 mL 3   clopidogrel (PLAVIX) 75 MG tablet TAKE 1 TABLET (75 MG TOTAL) BY MOUTH DAILY FOR 21 DAYS. 90 tablet 3   guaiFENesin (MUCINEX) 600 MG 12 hr tablet Take 1 tablet (600 mg total) by mouth 2 (two) times daily. 60 tablet 1   levothyroxine (SYNTHROID) 112 MCG tablet TAKE 1 TABLET BY MOUTH EVERY DAY 90 tablet 3   liothyronine (CYTOMEL) 25 MCG tablet Take 0.5 tablets (12.5 mcg total) by mouth daily. 45 tablet 3   meclizine (ANTIVERT) 12.5 MG tablet TAKE 1 TABLET BY MOUTH 3 TIMES DAILY AS NEEDED FOR DIZZINESS. 60 tablet 1   metoprolol succinate (TOPROL-XL) 25 MG 24 hr tablet Take 1 tablet (25 mg total) by mouth daily. 45 tablet 3   pantoprazole (PROTONIX) 40 MG tablet Take 1 tablet (40 mg total) by mouth 2 (two) times daily. 180 tablet 3   rosuvastatin (CRESTOR) 20 MG tablet TAKE 1 TABLET BY MOUTH EVERY DAY  90 tablet 1   telmisartan (MICARDIS) 20 MG tablet Take 1 tablet (20 mg total) by mouth daily. 90 tablet 3   venlafaxine XR (EFFEXOR-XR) 150 MG 24 hr capsule TAKE 1 CAPSULE BY MOUTH DAILY WITH BREAKFAST. 90 capsule 1   vitamin B-12 (CYANOCOBALAMIN) 1000 MCG tablet Take 1,000 mcg by mouth daily.     No current facility-administered medications for this visit.   Facility-Administered Medications Ordered in Other Visits  Medication Dose Route Frequency Provider Last Rate Last Admin   denosumab (PROLIA) injection 60 mg  60 mg Subcutaneous Once Loa Socks, NP        Allergies as of 12/30/2022 - Review Complete 12/30/2022  Allergen Reaction Noted   Pneumococcal vaccines Other (See Comments) 05/07/2016   Sulfa antibiotics Other (See Comments) 09/05/2016   Sulfacetamide sodium-sulfur     Sulfamethoxazole-trimethoprim  03/19/2017   Tape Itching, Dermatitis, and Rash 09/05/2016   Tegaderm ag mesh [silver] Itching and Rash 09/18/2016    Family History  Problem Relation Age of Onset   Stroke Mother    Allergies Mother    Asthma Mother    Clotting disorder Mother    Heart disease Father 56       MI   Allergies Sister    Asthma Sister    Breast cancer Sister 83       Lymphoma 58; currently 79   Lymphoma Sister    Hyperparathyroidism Sister    Asthma Brother    Leukemia Brother        dx 77s   Allergies Daughter    Asthma Daughter    Allergies Sister    Breast cancer Sister 61       currently 3   Kidney cancer Paternal Aunt        kidney ca; deceased 71   Liver cancer Paternal Uncle        unk. 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  Other Topics Concern   Not on file  Social History Narrative   GI - Dr Kinnie Scales   Depression - Dr Evelene Croon   GYN - Dr Kathyrn Sheriff      Social Determinants of Health   Financial Resource Strain: Low Risk  (06/21/2022)   Overall Financial Resource Strain (CARDIA)    Difficulty of Paying Living Expenses: Not hard at all  Food Insecurity: No Food Insecurity (06/21/2022)   Hunger Vital Sign    Worried About Running Out of Food in the Last Year: Never true    Ran Out of Food in the Last Year: Never true  Transportation Needs: No Transportation Needs (06/21/2022)   PRAPARE - Administrator, Civil Service (Medical): No    Lack of Transportation (Non-Medical): No  Physical Activity: Insufficiently Active (06/21/2022)   Exercise Vital Sign    Days of Exercise per Week: 7 days    Minutes of Exercise per Session: 20 min  Stress: No Stress Concern Present (06/21/2022)   [employer] of Occupational Health - Occupational Stress Questionnaire    Feeling of Stress : Not at all  Social Connections: Socially Integrated (06/21/2022)   Social Connection and Isolation Panel [NHANES]    Frequency of Communication with Friends and Family: More than three times a week    Frequency of Social Gatherings with Friends and Family: More than three times a week    Attends Religious Services: More than 4 times per year    Active Member of [employer] or Organizations: Yes    Attends Banker Meetings: More than 4 times  per year    Marital Status: Married  Catering manager Violence: Not At Risk (06/21/2022)    Humiliation, Afraid, Rape, and Kick questionnaire    Fear of Current or Ex-Partner: No    Emotionally Abused: No    Physically Abused: No    Sexually Abused: No    Review of Systems:    Constitutional: No weight loss, fever, chills, weakness or fatigue HEENT: Eyes: No change in vision               Ears, Nose, Throat:  No change in hearing or congestion Skin: No rash or itching Cardiovascular: No chest pain, chest pressure or palpitations   Respiratory: No SOB or cough Gastrointestinal: See HPI and otherwise negative Genitourinary: No dysuria or change in urinary frequency Neurological: No headache, dizziness or syncope Musculoskeletal: No new muscle or joint pain Hematologic: No bleeding or bruising Psychiatric: No history of depression or anxiety    Physical Exam:  Vital signs: BP 106/62   Wt 148 lb (67.1 kg)   BMI 25.40 kg/m   Constitutional: NAD, Well developed, Well nourished, alert and cooperative Head:  Normocephalic and atraumatic. Eyes:   PEERL, EOMI. No icterus. Conjunctiva pink. Respiratory: Respirations even and unlabored. Lungs clear to auscultation bilaterally.   No wheezes, crackles, or rhonchi.  Cardiovascular:  Regular rate and rhythm. No peripheral edema, cyanosis or pallor.  Gastrointestinal:  Soft, nondistended, nontender. No rebound or guarding. Normal bowel sounds. No appreciable masses or hepatomegaly. Rectal:  Not performed.  Msk:  Symmetrical without gross deformities. Without edema, no deformity or joint abnormality.  Neurologic:  Alert and  oriented x4;  grossly normal neurologically.  Skin:   Dry and intact without significant lesions or rashes. Psychiatric: Oriented to person, place and time. Demonstrates good judgement and reason without abnormal affect or behaviors.   RELEVANT LABS AND IMAGING: CBC    Component Value Date/Time   WBC 6.5 10/28/2022 1805   WBC 6.2 09/06/2022 1012   WBC 4.9 06/20/2022 1327   RBC 4.19 10/28/2022 1805   RBC  3.86 (L) 09/06/2022 1012   HGB 10.8 (L) 10/28/2022 1805   HGB 9.6 (L) 12/11/2016 1017   HCT 35.3 10/28/2022 1805   HCT 28.5 (L) 12/11/2016 1017   PLT 389 10/28/2022 1805   MCV 84 10/28/2022 1805   MCV 99.5 12/11/2016 1017   MCH 25.8 (L) 10/28/2022 1805   MCH 27.5 09/06/2022 1012   MCHC 30.6 (L) 10/28/2022 1805   MCHC 32.0 09/06/2022 1012   RDW 14.0 10/28/2022 1805   RDW 19.8 (H) 12/11/2016 1017   LYMPHSABS 2.2 09/06/2022 1012   LYMPHSABS 1.5 12/11/2016 1017   MONOABS 0.4 09/06/2022 1012   MONOABS 0.5 12/11/2016 1017   EOSABS 0.2 09/06/2022 1012   EOSABS 0.0 12/11/2016 1017   BASOSABS 0.0 09/06/2022 1012   BASOSABS 0.0 12/11/2016 1017    CMP     Component Value Date/Time   NA 140 10/28/2022 1805   NA 139 12/11/2016 1017   K 4.6 10/28/2022 1805   K 3.7 12/11/2016 1017   CL 104 10/28/2022 1805   CL 104 10/09/2011 1403   CO2 20 10/28/2022 1805   CO2 22 12/11/2016 1017   GLUCOSE 105 (H) 10/28/2022 1805   GLUCOSE 138 (H) 09/06/2022 1012   GLUCOSE 122 12/11/2016 1017   GLUCOSE 113 (H) 10/09/2011 1403   BUN 13 10/28/2022 1805   BUN 16.9 12/11/2016 1017   CREATININE 1.07 (H) 10/28/2022 1805   CREATININE 1.30 (H)  09/06/2022 1012   CREATININE 1.74 (H) 10/20/2019 1634   CREATININE 1.2 (H) 12/11/2016 1017   CALCIUM 9.6 10/28/2022 1805   CALCIUM 9.0 12/11/2016 1017   PROT 7.3 10/28/2022 1805   PROT 7.4 12/11/2016 1017   ALBUMIN 4.5 10/28/2022 1805   ALBUMIN 4.0 12/11/2016 1017   AST 23 10/28/2022 1805   AST 18 09/06/2022 1012   AST 65 (H) 12/11/2016 1017   ALT 13 10/28/2022 1805   ALT 11 09/06/2022 1012   ALT 51 12/11/2016 1017   ALKPHOS 88 10/28/2022 1805   ALKPHOS 139 12/11/2016 1017   BILITOT 0.3 10/28/2022 1805   BILITOT 0.4 09/06/2022 1012   BILITOT 0.42 12/11/2016 1017   GFRNONAA 41 (L) 09/06/2022 1012   GFRNONAA 27 (L) 10/20/2019 1634   GFRAA 32 (L) 10/20/2019 1634     Assessment/Plan:      Dysphagia Reports difficulty swallowing solids for several  weeks, with sensation of food getting stuck in the suprasternal notch. No issues with liquids. History of thyroidectomy. No GERD. --Schedule barium swallow with tab to assess for esophageal strictures or motility issues.  Abdominal Pain Right lower quadrant pain improved after discontinuing daily consumption of raw baby carrots. Possible IBS related to raw vegetable intake. --Advise moderation in consumption of raw vegetables. Though recommend steamed vegetables instead (patient dislikes steamed). --IBgard for potential IBS-related pain recurrence.  Bowel Habit Changes Reports multiple bowel movements daily, not always formed. Currently taking Metamucil, which has improved stool consistency. Colonoscopy in 2020 for same. Recent CT January 2024 is reassuring. No red flag symptoms. --Continue Metamucil daily.  Urinary Tract Infection Reports symptoms consistent with UTI starting a few days prior to visit. Self-medicated with amoxicillin without improvement. --Advise to contact primary care provider for appropriate antibiotic prescription.  Dizziness/Inner Ear Issue Reports ongoing dizziness and wobbliness, possibly related to known inner ear issue. Also reports ear fullness and ringing. -Advise to contact primary care provider for referral to ENT specialist.      [facility] 12/30/2022, 8:56 AM  Cc: Plotnikov, Georgina Quint, MD

## 2022-12-30 NOTE — Patient Instructions (Addendum)
You have been scheduled for a Barium Esophogram at [facility] (1st floor of the hospital) on 01/03/2023 at 11:00 am. Please arrive 30 minutes prior to your appointment for registration. Make certain not to have anything to eat or drink 3 hours prior to your test. If you need to reschedule for any reason, please contact radiology at [phone] to do so. __________________________________________________________________ A barium swallow is an examination that concentrates on views of the esophagus. This tends to be a double contrast exam (barium and two liquids which, when combined, create a gas to distend the wall of the oesophagus) or single contrast (non-ionic iodine based). The study is usually tailored to your symptoms so a good history is essential. Attention is paid during the study to the form, structure and configuration of the esophagus, looking for functional disorders (such as aspiration, dysphagia, achalasia, motility and reflux) EXAMINATION You may be asked to change into a gown, depending on the type of swallow being performed. A radiologist and radiographer will perform the procedure. The radiologist will advise you of the type of contrast selected for your procedure and direct you during the exam. You will be asked to stand, sit or lie in several different positions and to hold a small amount of fluid in your mouth before being asked to swallow while the imaging is performed .In some instances you may be asked to swallow barium coated marshmallows to assess the motility of a solid food bolus. The exam can be recorded as a digital or video fluoroscopy procedure. POST PROCEDURE It will take 1-2 days for the barium to pass through your system. To facilitate this, it is important, unless otherwise directed, to increase your fluids for the next 24-48hrs and to resume your normal diet.  This test typically takes about 30 minutes to  perform. __________________________________________________________________________________    VISIT SUMMARY:  During today's visit, we discussed your recent health concerns, including abdominal pain, urinary symptoms, trouble swallowing, changes in bowel habits, and dizziness. We reviewed your symptoms and created a plan to address each issue.  YOUR PLAN:  -DYSPHAGIA: Dysphagia means difficulty swallowing. You have been experiencing trouble swallowing solid foods, with a sensation of food getting stuck in your throat. We will schedule a  barium swallow test to check for any blockages or issues with how your esophagus moves.  -ABDOMINAL PAIN: Your right lower quadrant abdominal pain has improved after you stopped eating raw baby carrots daily. This might be related to irritable bowel syndrome (IBS) triggered by raw vegetables. We recommend you moderate your intake of raw vegetables and will prescribe IBgard to help manage any potential IBS-related pain.  -BOWEL HABIT CHANGES: You have been having multiple bowel movements daily, which are not always formed. Taking Metamucil has helped improve the consistency of your stools. Please continue taking Metamucil daily to maintain this improvement.  -URINARY TRACT INFECTION: You have symptoms of a urinary tract infection (UTI) that have not improved with self-medication. It is important to contact your primary care provider to get the appropriate antibiotic prescription to treat the infection.  -DIZZINESS/INNER EAR ISSUE: You are experiencing dizziness and wobbliness, which you believe is related to an inner ear problem. You also have ear fullness and ringing. Please contact your primary care provider to get a referral to an ENT specialist for further evaluation.  INSTRUCTIONS:  Please follow up with your primary care provider for an appropriate antibiotic prescription for your urinary tract infection and to get a referral to an ENT specialist  for  your dizziness and inner ear issues. We will schedule a barium swallow test to assess your swallowing difficulties.  Due to recent changes in healthcare laws, you may see the results of your imaging and laboratory studies on MyChart before your provider has had a chance to review them.  We understand that in some cases there may be results that are confusing or concerning to you. Not all laboratory results come back in the same time frame and the provider may be waiting for multiple results in order to interpret others.  Please give [place] 48 hours in order for your provider to thoroughly review all the results before contacting the office for clarification of your results.  I appreciate the  opportunity to care for you  Thank You   [facility] [place]

## 2022-12-30 NOTE — Assessment & Plan Note (Signed)
No change 

## 2022-12-30 NOTE — Assessment & Plan Note (Signed)
Start Ceftin x 10 d

## 2022-12-30 NOTE — Assessment & Plan Note (Signed)
UTI - start Ceftin po

## 2022-12-30 NOTE — Progress Notes (Signed)
Subjective:  Patient ID: [name] [name], female    DOB: 09/15/1940  Age: 82 y.o. MRN: [id number]  CC: Urinary Frequency   HPI YARDLEY LABIANCA presents for dysuria since Friday No fever Carmesha took Amoxicillin on Sat - better C/o sinusitis sx's x 3 weeks  Outpatient Medications Prior to Visit  Medication Sig Dispense Refill   ALPRAZolam (XANAX) 1 MG tablet Take 1 tablet (1 mg total) by mouth 2 (two) times daily as needed for anxiety. 60 tablet 2   anastrozole (ARIMIDEX) 1 MG tablet TAKE 1 TABLET BY MOUTH EVERY DAY 90 tablet 1   cholecalciferol (VITAMIN D) 1000 units tablet Take 2 tablets (2,000 Units total) by mouth daily. 100 tablet 3   clobetasol (TEMOVATE) 0.05 % external solution Apply 1 Application topically 2 (two) times daily. 50 mL 3   clopidogrel (PLAVIX) 75 MG tablet TAKE 1 TABLET (75 MG TOTAL) BY MOUTH DAILY FOR 21 DAYS. 90 tablet 3   guaiFENesin (MUCINEX) 600 MG 12 hr tablet Take 1 tablet (600 mg total) by mouth 2 (two) times daily. 60 tablet 1   levothyroxine (SYNTHROID) 112 MCG tablet TAKE 1 TABLET BY MOUTH EVERY DAY 90 tablet 3   liothyronine (CYTOMEL) 25 MCG tablet Take 0.5 tablets (12.5 mcg total) by mouth daily. 45 tablet 3   meclizine (ANTIVERT) 12.5 MG tablet TAKE 1 TABLET BY MOUTH 3 TIMES DAILY AS NEEDED FOR DIZZINESS. 60 tablet 1   metoprolol succinate (TOPROL-XL) 25 MG 24 hr tablet Take 1 tablet (25 mg total) by mouth daily. 45 tablet 3   pantoprazole (PROTONIX) 40 MG tablet Take 1 tablet (40 mg total) by mouth 2 (two) times daily. 180 tablet 3   rosuvastatin (CRESTOR) 20 MG tablet TAKE 1 TABLET BY MOUTH EVERY DAY 90 tablet 1   telmisartan (MICARDIS) 20 MG tablet Take 1 tablet (20 mg total) by mouth daily. 90 tablet 3   venlafaxine XR (EFFEXOR-XR) 150 MG 24 hr capsule TAKE 1 CAPSULE BY MOUTH DAILY WITH BREAKFAST. 90 capsule 1   vitamin B-12 (CYANOCOBALAMIN) 1000 MCG tablet Take 1,000 mcg by mouth daily.     Facility-Administered Medications Prior to Visit   Medication Dose Route Frequency Provider Last Rate Last Admin   denosumab (PROLIA) injection 60 mg  60 mg Subcutaneous Once Causey, Larna Daughters, NP        ROS: Review of Systems  Constitutional:  Positive for fatigue. Negative for chills and fever.  HENT:  Positive for congestion, postnasal drip and rhinorrhea.   Respiratory:  Positive for cough.   Genitourinary:  Positive for dysuria and urgency. Negative for difficulty urinating, flank pain and vaginal pain.    Objective:  BP 118/81 (BP Location: Right Arm, Patient Position: Sitting, Cuff Size: Normal)   Pulse 96   Temp 98.2 F (36.8 C) (Oral)   Ht 5\' 4"  (1.626 m)   Wt 149 lb (67.6 kg)   SpO2 97%   BMI 25.58 kg/m   BP Readings from Last 3 Encounters:  12/30/22 118/81  12/30/22 106/62  12/18/22 120/80    Wt Readings from Last 3 Encounters:  12/30/22 149 lb (67.6 kg)  12/30/22 148 lb (67.1 kg)  12/18/22 147 lb (66.7 kg)    Physical Exam Constitutional:      Appearance: Normal appearance. She is not toxic-appearing.  HENT:     Right Ear: Tympanic membrane normal.     Left Ear: Tympanic membrane normal. There is no impacted cerumen.     Nose: Congestion  and rhinorrhea present.  Pulmonary:     Breath sounds: No rhonchi.  Musculoskeletal:        General: No tenderness.     Right lower leg: No edema.     Left lower leg: No edema.  Skin:    Findings: Bruising present.  Neurological:     Mental Status: She is oriented to person, place, and time. Mental status is at baseline.     Gait: Gait abnormal.     Lab Results  Component Value Date   WBC 6.5 10/28/2022   HGB 10.8 (L) 10/28/2022   HCT 35.3 10/28/2022   PLT 389 10/28/2022   GLUCOSE 105 (H) 10/28/2022   CHOL 134 08/10/2020   TRIG 263 (H) 08/10/2020   HDL 37 (L) 08/10/2020   LDLDIRECT 125.0 06/05/2015   LDLCALC 44 08/10/2020   ALT 13 10/28/2022   AST 23 10/28/2022   NA 140 10/28/2022   K 4.6 10/28/2022   CL 104 10/28/2022   CREATININE 1.07 (H)  10/28/2022   BUN 13 10/28/2022   CO2 20 10/28/2022   TSH 0.08 (L) 06/20/2022   INR 1.1 08/16/2021   HGBA1C 6.2 06/20/2022    No results found.  Assessment & Plan:   Problem List Items Addressed This Visit     Dyslipidemia    Calcium CT score 902 which is 87 th percentile for age/sex Non obstructive CAD, mild stenosis on coronary CT On Plavix, Crestor, Toprol       Acute sinusitis    Start Ceftin x 10 d      Relevant Medications   cefUROXime (CEFTIN) 250 MG tablet   Essential hypertension    On Telmisartan at lower dose      Gait disorder    No change      Dysuria - Primary    UTI - start Ceftin po         Meds ordered this encounter  Medications   cefUROXime (CEFTIN) 250 MG tablet    Sig: Take 1 tablet (250 mg total) by mouth 2 (two) times daily with a meal for 10 days.    Dispense:  20 tablet    Refill:  0      Follow-up: No follow-ups on file.  Sonda Primes, MD

## 2022-12-30 NOTE — Progress Notes (Signed)
I agree with the assessment and plan as outlined by Ms. McMichael. 

## 2022-12-30 NOTE — Assessment & Plan Note (Signed)
Calcium CT score 902 which is 87 th percentile for age/sex Non obstructive CAD, mild stenosis on coronary CT On Plavix, Crestor, Toprol

## 2023-01-02 ENCOUNTER — Ambulatory Visit: Payer: Medicare HMO | Admitting: Internal Medicine

## 2023-01-03 ENCOUNTER — Other Ambulatory Visit: Payer: Self-pay | Admitting: Gastroenterology

## 2023-01-03 ENCOUNTER — Ambulatory Visit (HOSPITAL_COMMUNITY)
Admission: RE | Admit: 2023-01-03 | Discharge: 2023-01-03 | Disposition: A | Discharge status: Home / Self Care | Payer: Medicare HMO | Source: Ambulatory Visit | Attending: Gastroenterology | Admitting: Gastroenterology

## 2023-01-03 DIAGNOSIS — N390 Urinary tract infection, site not specified: Secondary | ICD-10-CM

## 2023-01-03 DIAGNOSIS — R42 Dizziness and giddiness: Secondary | ICD-10-CM

## 2023-01-03 DIAGNOSIS — R1031 Right lower quadrant pain: Secondary | ICD-10-CM

## 2023-01-03 DIAGNOSIS — R1310 Dysphagia, unspecified: Secondary | ICD-10-CM

## 2023-01-03 DIAGNOSIS — R059 Cough, unspecified: Secondary | ICD-10-CM | POA: Diagnosis not present

## 2023-01-03 DIAGNOSIS — K219 Gastro-esophageal reflux disease without esophagitis: Secondary | ICD-10-CM | POA: Diagnosis not present

## 2023-01-03 DIAGNOSIS — R194 Change in bowel habit: Secondary | ICD-10-CM

## 2023-01-06 ENCOUNTER — Telehealth: Payer: Self-pay | Admitting: *Deleted

## 2023-01-06 ENCOUNTER — Other Ambulatory Visit: Payer: Self-pay | Admitting: *Deleted

## 2023-01-06 DIAGNOSIS — K224 Dyskinesia of esophagus: Secondary | ICD-10-CM

## 2023-01-06 DIAGNOSIS — R1310 Dysphagia, unspecified: Secondary | ICD-10-CM

## 2023-01-06 NOTE — Telephone Encounter (Signed)
  [name] [name] 11-24-1940 [id number]  01/06/23   Dear Dr Posey Rea:  We have scheduled the above named patient for an endoscopy procedure. Our records show that (s)he is on anticoagulation therapy.  Please advise as to whether the patient may come off their therapy of Plavix 5 days prior to their procedure which is scheduled for 01/31/23.  Please route your response to Vernia Buff, RN or fax response to [phone].  Sincerely,    [facility]

## 2023-01-07 NOTE — Telephone Encounter (Signed)
I have spoken to patient to advise that per Dr Posey Rea, she may hold Plavix 5 days prior to her upcoming procedure. Patient verbalizes understanding of this information.

## 2023-01-07 NOTE — Telephone Encounter (Signed)
Dear [name] [name], It is okay. Sincerely, AP

## 2023-01-12 NOTE — Assessment & Plan Note (Signed)
See procedure 

## 2023-01-14 ENCOUNTER — Telehealth: Payer: Self-pay | Admitting: Gastroenterology

## 2023-01-14 NOTE — Telephone Encounter (Signed)
Inbound call from patient stating she has not received prep instructions for 12/20 EGD. Please advise, thank you.

## 2023-01-14 NOTE — Telephone Encounter (Signed)
Advised that prep instructions were mailed out the afternoon of 01/06/23. Advised that paperwork likely did not get mailed from our internal mailing service until this week, so paperwork is probably on its way. However, I have sent another set of instructions to patient's home address just in case. She verbalizes understanding.

## 2023-01-17 ENCOUNTER — Telehealth: Payer: Self-pay | Admitting: Internal Medicine

## 2023-01-17 NOTE — Telephone Encounter (Signed)
Left message for patient to call back.  As discussed on 01/14/23, paperwork is likely still in the mail system. I did send an additional set of instructions again on 01/14/23. Again, this would still likely be in the mail as well. I am glad to place a copy of instructions at our front desk for pick up is she is willing to come by for them.

## 2023-01-17 NOTE — Telephone Encounter (Signed)
Patient called and stated that she has still not received her prep instruction for her procedure on the 20th of Dec. Please advise.

## 2023-01-20 NOTE — Telephone Encounter (Signed)
I have spoken to patient to advise that I have placed a copy of endoscopy prep and manometry prep instructions at our front desk for her to pick up to insure that she does instructions available in time for her procedure. She verbalizes understanding.

## 2023-01-22 ENCOUNTER — Encounter: Payer: Self-pay | Admitting: Internal Medicine

## 2023-01-27 ENCOUNTER — Encounter: Payer: Medicare HMO | Admitting: Internal Medicine

## 2023-01-30 ENCOUNTER — Encounter: Payer: Self-pay | Admitting: Certified Registered Nurse Anesthetist

## 2023-01-31 ENCOUNTER — Telehealth: Payer: Self-pay

## 2023-01-31 ENCOUNTER — Encounter: Payer: Medicare HMO | Admitting: Internal Medicine

## 2023-01-31 NOTE — Telephone Encounter (Signed)
Patient arrived today for EGD/endoscopy with Dr. Leonides Schanz.  Pt did not stop Plavix blood thinner for the required 5 days.  Pt was rescheduled for 03/05/23.  Pt needs updated prep/procedure instructions.  Pt requests instructions to be mailed.

## 2023-01-31 NOTE — Progress Notes (Unsigned)
Patient took her plavix on 01/29/2023 and was supposed to hold this medication for 5 days. Patient is being rescheduled.

## 2023-02-03 NOTE — Telephone Encounter (Signed)
New instructions mailed to patient's home address.

## 2023-03-05 ENCOUNTER — Encounter: Payer: Medicare HMO | Admitting: Internal Medicine

## 2023-03-12 DIAGNOSIS — H35052 Retinal neovascularization, unspecified, left eye: Secondary | ICD-10-CM | POA: Diagnosis not present

## 2023-03-12 DIAGNOSIS — Z01818 Encounter for other preprocedural examination: Secondary | ICD-10-CM | POA: Diagnosis not present

## 2023-03-12 DIAGNOSIS — H2512 Age-related nuclear cataract, left eye: Secondary | ICD-10-CM | POA: Diagnosis not present

## 2023-03-18 ENCOUNTER — Telehealth: Payer: Self-pay | Admitting: Adult Health

## 2023-03-19 DIAGNOSIS — H353221 Exudative age-related macular degeneration, left eye, with active choroidal neovascularization: Secondary | ICD-10-CM | POA: Diagnosis not present

## 2023-03-20 ENCOUNTER — Encounter: Payer: Self-pay | Admitting: Internal Medicine

## 2023-03-20 ENCOUNTER — Ambulatory Visit (INDEPENDENT_AMBULATORY_CARE_PROVIDER_SITE_OTHER): Payer: Medicare HMO | Admitting: Internal Medicine

## 2023-03-20 VITALS — BP 120/80 | HR 83 | Temp 98.2°F | Ht 60.0 in | Wt 148.0 lb

## 2023-03-20 DIAGNOSIS — R270 Ataxia, unspecified: Secondary | ICD-10-CM | POA: Diagnosis not present

## 2023-03-20 DIAGNOSIS — C50412 Malignant neoplasm of upper-outer quadrant of left female breast: Secondary | ICD-10-CM

## 2023-03-20 DIAGNOSIS — R5382 Chronic fatigue, unspecified: Secondary | ICD-10-CM | POA: Diagnosis not present

## 2023-03-20 DIAGNOSIS — Z170 Estrogen receptor positive status [ER+]: Secondary | ICD-10-CM | POA: Diagnosis not present

## 2023-03-20 DIAGNOSIS — E559 Vitamin D deficiency, unspecified: Secondary | ICD-10-CM | POA: Diagnosis not present

## 2023-03-20 DIAGNOSIS — F419 Anxiety disorder, unspecified: Secondary | ICD-10-CM | POA: Diagnosis not present

## 2023-03-20 DIAGNOSIS — I10 Essential (primary) hypertension: Secondary | ICD-10-CM

## 2023-03-20 DIAGNOSIS — E538 Deficiency of other specified B group vitamins: Secondary | ICD-10-CM

## 2023-03-20 DIAGNOSIS — E785 Hyperlipidemia, unspecified: Secondary | ICD-10-CM | POA: Diagnosis not present

## 2023-03-20 LAB — COMPREHENSIVE METABOLIC PANEL
ALT: 14 U/L (ref 0–35)
AST: 22 U/L (ref 0–37)
Albumin: 4.3 g/dL (ref 3.5–5.2)
Alkaline Phosphatase: 63 U/L (ref 39–117)
BUN: 26 mg/dL — ABNORMAL HIGH (ref 6–23)
CO2: 24 meq/L (ref 19–32)
Calcium: 10.5 mg/dL (ref 8.4–10.5)
Chloride: 104 meq/L (ref 96–112)
Creatinine, Ser: 1.75 mg/dL — ABNORMAL HIGH (ref 0.40–1.20)
GFR: 26.53 mL/min — ABNORMAL LOW (ref >60.00)
Glucose, Bld: 126 mg/dL — ABNORMAL HIGH (ref 70–99)
Potassium: 5.1 meq/L (ref 3.5–5.1)
Sodium: 139 meq/L (ref 135–145)
Total Bilirubin: 0.5 mg/dL (ref 0.2–1.2)
Total Protein: 7.8 g/dL (ref 6.0–8.3)

## 2023-03-20 LAB — CBC WITH DIFFERENTIAL/PLATELET
Basophils Absolute: 0 10*3/uL (ref 0.0–0.1)
Basophils Relative: 0.4 % (ref 0.0–3.0)
Eosinophils Absolute: 0.2 10*3/uL (ref 0.0–0.7)
Eosinophils Relative: 1.9 % (ref 0.0–5.0)
HCT: 33.5 % — ABNORMAL LOW (ref 36.0–46.0)
Hemoglobin: 10.4 g/dL — ABNORMAL LOW (ref 12.0–15.0)
Lymphocytes Relative: 29.7 % (ref 12.0–46.0)
Lymphs Abs: 2.7 10*3/uL (ref 0.7–4.0)
MCHC: 31 g/dL (ref 30.0–36.0)
MCV: 80.2 fL (ref 78.0–100.0)
Monocytes Absolute: 0.9 10*3/uL (ref 0.1–1.0)
Monocytes Relative: 10 % (ref 3.0–12.0)
Neutro Abs: 5.2 10*3/uL (ref 1.4–7.7)
Neutrophils Relative %: 58 % (ref 43.0–77.0)
Platelets: 428 10*3/uL — ABNORMAL HIGH (ref 150.0–400.0)
RBC: 4.18 Mil/uL (ref 3.87–5.11)
RDW: 17 % — ABNORMAL HIGH (ref 11.5–15.5)
WBC: 9 10*3/uL (ref 4.0–10.5)

## 2023-03-20 LAB — LIPID PANEL
Cholesterol: 179 mg/dL (ref 0–200)
HDL: 46.2 mg/dL (ref >39.00)
LDL Cholesterol: 67 mg/dL (ref 0–99)
NonHDL: 132.49
Total CHOL/HDL Ratio: 4
Triglycerides: 326 mg/dL — ABNORMAL HIGH (ref 0.0–149.0)
VLDL: 65.2 mg/dL — ABNORMAL HIGH (ref 0.0–40.0)

## 2023-03-20 LAB — VITAMIN B12: Vitamin B-12: >1537 pg/mL — ABNORMAL HIGH (ref 211–911)

## 2023-03-20 LAB — VITAMIN D 25 HYDROXY (VIT D DEFICIENCY, FRACTURES): VITD: 48.51 ng/mL (ref 30.00–100.00)

## 2023-03-20 LAB — TSH: TSH: 3.53 u[IU]/mL (ref 0.35–5.50)

## 2023-03-20 NOTE — Assessment & Plan Note (Signed)
Discussed Cont w/Xanax prn  Potential benefits of a long term benzodiazepines  use as well as potential risks  and complications were explained to the patient and were aknowledged.

## 2023-03-20 NOTE — Assessment & Plan Note (Signed)
On Telmisartan at lower dose

## 2023-03-20 NOTE — Assessment & Plan Note (Signed)
F/u w/Oncology 

## 2023-03-20 NOTE — Assessment & Plan Note (Signed)
 Fall prevention was discussed

## 2023-03-20 NOTE — Assessment & Plan Note (Signed)
 On Vit D

## 2023-03-20 NOTE — Assessment & Plan Note (Signed)
 On B12

## 2023-03-20 NOTE — Progress Notes (Signed)
 Subjective:  Patient ID: [name] [name], female    DOB: Aug 28, 1940  Age: 83 y.o. MRN: [id number]  CC: Medical Management of Chronic Issues (3 MNTH F/U, Pt is having balance issues and falling over, unable to walk steady. Pt wants to discuss current medications to try and eliminate some if possible)   HPI [name] [name] presents for 3 MNTH F/U, Pt is having balance issues and falling over, unable to walk steady. Pt wants to discuss current medications to try and eliminate some if possible.  F/u poor balance, HTN, anxiety  Outpatient Medications Prior to Visit  Medication Sig Dispense Refill   ALPRAZolam  (XANAX ) 1 MG tablet Take 1 tablet (1 mg total) by mouth 2 (two) times daily as needed for anxiety. 60 tablet 2   anastrozole  (ARIMIDEX ) 1 MG tablet TAKE 1 TABLET BY MOUTH EVERY DAY 90 tablet 1   cholecalciferol (VITAMIN D ) 1000 units tablet Take 2 tablets (2,000 Units total) by mouth daily. 100 tablet 3   clobetasol  (TEMOVATE ) 0.05 % external solution Apply 1 Application topically 2 (two) times daily. 50 mL 3   clopidogrel  (PLAVIX ) 75 MG tablet TAKE 1 TABLET (75 MG TOTAL) BY MOUTH DAILY FOR 21 DAYS. 90 tablet 3   guaiFENesin  (MUCINEX ) 600 MG 12 hr tablet Take 1 tablet (600 mg total) by mouth 2 (two) times daily. 60 tablet 1   levothyroxine  (SYNTHROID ) 112 MCG tablet TAKE 1 TABLET BY MOUTH EVERY DAY 90 tablet 3   liothyronine  (CYTOMEL ) 25 MCG tablet Take 0.5 tablets (12.5 mcg total) by mouth daily. 45 tablet 3   meclizine  (ANTIVERT ) 12.5 MG tablet TAKE 1 TABLET BY MOUTH 3 TIMES DAILY AS NEEDED FOR DIZZINESS. 60 tablet 1   metoprolol  succinate (TOPROL -XL) 25 MG 24 hr tablet Take 1 tablet (25 mg total) by mouth daily. 45 tablet 3   pantoprazole  (PROTONIX ) 40 MG tablet Take 1 tablet (40 mg total) by mouth 2 (two) times daily. 180 tablet 3   rosuvastatin  (CRESTOR ) 20 MG tablet TAKE 1 TABLET BY MOUTH EVERY DAY 90 tablet 1   telmisartan  (MICARDIS ) 20 MG tablet Take 1 tablet (20 mg total) by mouth  daily. 90 tablet 3   venlafaxine  XR (EFFEXOR -XR) 150 MG 24 hr capsule TAKE 1 CAPSULE BY MOUTH DAILY WITH BREAKFAST. 90 capsule 1   vitamin B-12 (CYANOCOBALAMIN ) 1000 MCG tablet Take 1,000 mcg by mouth daily.     Facility-Administered Medications Prior to Visit  Medication Dose Route Frequency Provider Last Rate Last Admin   denosumab  (PROLIA ) injection 60 mg  60 mg Subcutaneous Once Causey, Lindsey Cornetto, NP        ROS: Review of Systems  Constitutional:  Positive for fatigue. Negative for activity change, appetite change, chills and unexpected weight change.  HENT:  Negative for congestion, mouth sores and sinus pressure.   Eyes:  Negative for visual disturbance.  Respiratory:  Negative for cough and chest tightness.   Gastrointestinal:  Negative for abdominal pain and nausea.  Genitourinary:  Negative for difficulty urinating, frequency and vaginal pain.  Musculoskeletal:  Positive for arthralgias and gait problem. Negative for back pain.  Skin:  Negative for color change, pallor, rash and wound.  Neurological:  Negative for dizziness, tremors, weakness, numbness and headaches.  Psychiatric/Behavioral:  Negative for confusion and sleep disturbance.     Objective:  BP 120/80   Pulse 83   Temp 98.2 F (36.8 C) (Oral)   Ht 5' (1.524 m)   Wt 148 lb (67.1 kg)  SpO2 94%   BMI 28.90 kg/m   BP Readings from Last 3 Encounters:  03/20/23 120/80  12/30/22 118/81  12/30/22 106/62    Wt Readings from Last 3 Encounters:  03/20/23 148 lb (67.1 kg)  12/30/22 149 lb (67.6 kg)  12/30/22 148 lb (67.1 kg)    Physical Exam Constitutional:      General: She is not in acute distress.    Appearance: She is well-developed. She is obese.  HENT:     Head: Normocephalic.     Right Ear: External ear normal.     Left Ear: External ear normal.     Nose: Nose normal.  Eyes:     General:        Right eye: No discharge.        Left eye: No discharge.     Conjunctiva/sclera:  Conjunctivae normal.     Pupils: Pupils are equal, round, and reactive to light.  Neck:     Thyroid : No thyromegaly.     Vascular: No JVD.     Trachea: No tracheal deviation.  Cardiovascular:     Rate and Rhythm: Normal rate and regular rhythm.     Heart sounds: Normal heart sounds.  Pulmonary:     Effort: No respiratory distress.     Breath sounds: No stridor. No wheezing.  Abdominal:     General: Bowel sounds are normal. There is no distension.     Palpations: Abdomen is soft. There is no mass.     Tenderness: There is no abdominal tenderness. There is no guarding or rebound.  Musculoskeletal:        General: No tenderness.     Cervical back: Normal range of motion and neck supple. No rigidity.     Right lower leg: No edema.     Left lower leg: No edema.  Lymphadenopathy:     Cervical: No cervical adenopathy.  Skin:    Findings: No erythema or rash.  Neurological:     Mental Status: She is oriented to person, place, and time.     Cranial Nerves: No cranial nerve deficit.     Motor: No abnormal muscle tone.     Coordination: Coordination abnormal.     Gait: Gait abnormal.     Deep Tendon Reflexes: Reflexes normal.  Psychiatric:        Behavior: Behavior normal.        Thought Content: Thought content normal.        Judgment: Judgment normal.   Ataxic at baseline  Lab Results  Component Value Date   WBC 6.5 10/28/2022   HGB 10.8 (L) 10/28/2022   HCT 35.3 10/28/2022   PLT 389 10/28/2022   GLUCOSE 105 (H) 10/28/2022   CHOL 134 08/10/2020   TRIG 263 (H) 08/10/2020   HDL 37 (L) 08/10/2020   LDLDIRECT 125.0 06/05/2015   LDLCALC 44 08/10/2020   ALT 13 10/28/2022   AST 23 10/28/2022   NA 140 10/28/2022   K 4.6 10/28/2022   CL 104 10/28/2022   CREATININE 1.07 (H) 10/28/2022   BUN 13 10/28/2022   CO2 20 10/28/2022   TSH 0.08 (L) 06/20/2022   INR 1.1 08/16/2021   HGBA1C 6.2 06/20/2022    DG ESOPHAGUS W DOUBLE CM (HD) Result Date: 01/03/2023 CLINICAL DATA:  83  year old female with complaint of coughing with meals, pills getting stuck. EXAM: ESOPHAGUS/BARIUM SWALLOW/TABLET STUDY TECHNIQUE: Combined double and single contrast examination was performed using effervescent crystals, high-density barium, and  thin liquid barium. This exam was performed by Kacie Matthwes PA-C, and was supervised and interpreted by Marcey Moan, MD. FLUOROSCOPY: Radiation Exposure Index (as provided by the fluoroscopic device): 15.2 mGy Kerma COMPARISON:  CT CHEST ABDOMEN PELVIS 03/07/22. FINDINGS: Swallowing: Appears normal. No vestibular penetration or aspiration seen. Pharynx: Unremarkable. Esophagus: Patulous esophagus throughout. Esophageal motility: Tertiary contractions and incomplete primary stripping wave contributing to poor progression of barium bolus. Hiatal Hernia: None. Gastroesophageal reflux: Moderate gastroesophageal reflux to at least the mid thoracic esophagus. Ingested 13mm barium tablet: Became stuck and does not pass after several minutes. Other: None. IMPRESSION: 1.  Patulous esophagus 2. Tertiary contractions, poor primary stripping wave contributing to severe esophageal dysmotility likely the cause of inability of 13mm tablet to pass through to the stomach. 3.  Moderate gastroesophageal reflux. Electronically Signed   By: Marcey Moan M.D.   On: 01/03/2023 12:10    Assessment & Plan:   Problem List Items Addressed This Visit     B12 deficiency   On B12      Relevant Orders   Vitamin B12   Vitamin D  deficiency   On Vit D      Relevant Orders   VITAMIN D  25 Hydroxy (Vit-D Deficiency, Fractures)   Anxiety disorder   Discussed Cont w/Xanax  prn  Potential benefits of a long term benzodiazepines  use as well as potential risks  and complications were explained to the patient and were aknowledged.      Chronic fatigue   Fall prevention was discussed      Relevant Orders   CBC with Differential/Platelet   Comprehensive metabolic panel    Urinalysis   TSH   Essential hypertension   On Telmisartan  at lower dose      Relevant Orders   CBC with Differential/Platelet   Comprehensive metabolic panel   Urinalysis   TSH   Malignant neoplasm of upper-outer quadrant of left breast in female, estrogen receptor positive (HCC)   F/u w/Oncology      Ataxia - Primary   Fall prevention was discussed         No orders of the defined types were placed in this encounter.     Follow-up: Return in about 3 months (around 06/17/2023) for a follow-up visit.  Marolyn Noel, MD

## 2023-03-21 ENCOUNTER — Inpatient Hospital Stay: Payer: Medicare HMO

## 2023-03-21 LAB — URINALYSIS
Bilirubin Urine: NEGATIVE
Hgb urine dipstick: NEGATIVE
Ketones, ur: NEGATIVE
Leukocytes,Ua: NEGATIVE
Nitrite: NEGATIVE
Specific Gravity, Urine: >=1.03 — AB (ref 1.000–1.030)
Total Protein, Urine: NEGATIVE
Urine Glucose: NEGATIVE
Urobilinogen, UA: 0.2 (ref 0.0–1.0)
pH: 5.5 (ref 5.0–8.0)

## 2023-03-24 ENCOUNTER — Other Ambulatory Visit: Payer: Self-pay

## 2023-03-24 ENCOUNTER — Other Ambulatory Visit: Payer: Self-pay | Admitting: Internal Medicine

## 2023-03-24 DIAGNOSIS — Z170 Estrogen receptor positive status [ER+]: Secondary | ICD-10-CM

## 2023-03-25 ENCOUNTER — Other Ambulatory Visit: Payer: Self-pay | Admitting: Internal Medicine

## 2023-03-25 ENCOUNTER — Other Ambulatory Visit: Payer: Self-pay | Admitting: Hematology and Oncology

## 2023-03-25 ENCOUNTER — Telehealth: Payer: Self-pay | Admitting: Internal Medicine

## 2023-03-25 ENCOUNTER — Encounter: Payer: Self-pay | Admitting: Internal Medicine

## 2023-03-25 ENCOUNTER — Inpatient Hospital Stay: Payer: Medicare HMO | Attending: Adult Health

## 2023-03-25 ENCOUNTER — Inpatient Hospital Stay: Payer: Medicare HMO

## 2023-03-25 VITALS — BP 113/77 | HR 102 | Temp 97.6°F | Resp 18

## 2023-03-25 DIAGNOSIS — C50412 Malignant neoplasm of upper-outer quadrant of left female breast: Secondary | ICD-10-CM | POA: Insufficient documentation

## 2023-03-25 DIAGNOSIS — Z79811 Long term (current) use of aromatase inhibitors: Secondary | ICD-10-CM | POA: Insufficient documentation

## 2023-03-25 DIAGNOSIS — Z1722 Progesterone receptor negative status: Secondary | ICD-10-CM | POA: Diagnosis not present

## 2023-03-25 DIAGNOSIS — Z1732 Human epidermal growth factor receptor 2 negative status: Secondary | ICD-10-CM | POA: Insufficient documentation

## 2023-03-25 DIAGNOSIS — Z170 Estrogen receptor positive status [ER+]: Secondary | ICD-10-CM | POA: Diagnosis not present

## 2023-03-25 DIAGNOSIS — Z95828 Presence of other vascular implants and grafts: Secondary | ICD-10-CM

## 2023-03-25 DIAGNOSIS — M810 Age-related osteoporosis without current pathological fracture: Secondary | ICD-10-CM | POA: Diagnosis not present

## 2023-03-25 LAB — CMP (CANCER CENTER ONLY)
ALT: 15 U/L (ref 0–44)
AST: 21 U/L (ref 15–41)
Albumin: 4.1 g/dL (ref 3.5–5.0)
Alkaline Phosphatase: 69 U/L (ref 38–126)
Anion gap: 9 (ref 5–15)
BUN: 22 mg/dL (ref 8–23)
CO2: 24 mmol/L (ref 22–32)
Calcium: 10.1 mg/dL (ref 8.9–10.3)
Chloride: 107 mmol/L (ref 98–111)
Creatinine: 1.56 mg/dL — ABNORMAL HIGH (ref 0.44–1.00)
GFR, Estimated: 33 mL/min — ABNORMAL LOW (ref >60)
Glucose, Bld: 158 mg/dL — ABNORMAL HIGH (ref 70–99)
Potassium: 4.3 mmol/L (ref 3.5–5.1)
Sodium: 140 mmol/L (ref 135–145)
Total Bilirubin: 0.4 mg/dL (ref 0.0–1.2)
Total Protein: 7.5 g/dL (ref 6.5–8.1)

## 2023-03-25 LAB — CBC WITH DIFFERENTIAL (CANCER CENTER ONLY)
Abs Immature Granulocytes: 0.02 10*3/uL (ref 0.00–0.07)
Basophils Absolute: 0 10*3/uL (ref 0.0–0.1)
Basophils Relative: 0 %
Eosinophils Absolute: 0.2 10*3/uL (ref 0.0–0.5)
Eosinophils Relative: 3 %
HCT: 34.5 % — ABNORMAL LOW (ref 36.0–46.0)
Hemoglobin: 10.1 g/dL — ABNORMAL LOW (ref 12.0–15.0)
Immature Granulocytes: 0 %
Lymphocytes Relative: 25 %
Lymphs Abs: 1.8 10*3/uL (ref 0.7–4.0)
MCH: 24.3 pg — ABNORMAL LOW (ref 26.0–34.0)
MCHC: 29.3 g/dL — ABNORMAL LOW (ref 30.0–36.0)
MCV: 82.9 fL (ref 80.0–100.0)
Monocytes Absolute: 0.6 10*3/uL (ref 0.1–1.0)
Monocytes Relative: 8 %
Neutro Abs: 4.8 10*3/uL (ref 1.7–7.7)
Neutrophils Relative %: 64 %
Platelet Count: 384 10*3/uL (ref 150–400)
RBC: 4.16 MIL/uL (ref 3.87–5.11)
RDW: 16.1 % — ABNORMAL HIGH (ref 11.5–15.5)
WBC Count: 7.5 10*3/uL (ref 4.0–10.5)
nRBC: 0 % (ref 0.0–0.2)

## 2023-03-25 MED ORDER — DENOSUMAB 60 MG/ML ~~LOC~~ SOSY
60.0000 mg | PREFILLED_SYRINGE | Freq: Once | SUBCUTANEOUS | Status: AC
  Administered 2023-03-25: 60 mg via SUBCUTANEOUS

## 2023-03-25 MED ORDER — TELMISARTAN 20 MG PO TABS: 10.0000 mg | ORAL_TABLET | Freq: Every day | ORAL | Status: DC

## 2023-03-25 NOTE — Telephone Encounter (Signed)
 Copied from CRM [id number]. Topic: Clinical - Lab/Test Results >> Mar 25, 2023 11:21 AM Mackie Pai E wrote: Reason for CRM: Patient was calling in to see if the results of her lab work came in. Saw under status that it says "future" and not "final result" so I relayed to the patient that once the results are in we will be able to relay that information to her.

## 2023-03-25 NOTE — Progress Notes (Signed)
Decreased GFR

## 2023-03-26 ENCOUNTER — Telehealth: Payer: Self-pay

## 2023-03-26 ENCOUNTER — Other Ambulatory Visit: Payer: Self-pay | Admitting: Internal Medicine

## 2023-03-26 ENCOUNTER — Other Ambulatory Visit: Payer: Self-pay | Admitting: Family Medicine

## 2023-03-26 NOTE — Telephone Encounter (Unsigned)
Copied from CRM [id number]. Topic: Clinical - Prescription Issue >> Mar 25, 2023  4:46 PM Corin V wrote: Reason for CRM: Patient dropped her medication case when filling it Sunday night and some Xanax fell down the drain. A refill request is in via [employer] yesterday and she ants to make sure this can be refilled a few days early if needed.

## 2023-03-27 ENCOUNTER — Other Ambulatory Visit: Payer: Self-pay | Admitting: Internal Medicine

## 2023-03-27 ENCOUNTER — Ambulatory Visit: Payer: Self-pay | Admitting: Internal Medicine

## 2023-03-27 NOTE — Telephone Encounter (Signed)
Patient at pharmacy and stating pharmacy has not received refill request for Telmisartan that shows it was sent to pharmacy on 03/25/23. Patient and pharmacist requesting refill to be resent.   Copied from CRM [id number]. Topic: Clinical - Prescription Issue >> Mar 27, 2023  4:59 PM Sim Boast F wrote: Reason for CRM: Patient needs new script for telmisartan sent to the pharmacy since she is there now

## 2023-03-27 NOTE — Telephone Encounter (Signed)
I was able to speak with the pt and was able to inform her of her refill that she is able to get. Pt stated understanding and has contacted the pharmacy for a refill.  I was also able to inform pt of lab results. She stated understanding and has no questions or concerns.

## 2023-03-28 ENCOUNTER — Other Ambulatory Visit: Payer: Self-pay

## 2023-03-28 DIAGNOSIS — H2512 Age-related nuclear cataract, left eye: Secondary | ICD-10-CM | POA: Diagnosis not present

## 2023-03-28 DIAGNOSIS — I10 Essential (primary) hypertension: Secondary | ICD-10-CM | POA: Diagnosis not present

## 2023-03-28 DIAGNOSIS — E039 Hypothyroidism, unspecified: Secondary | ICD-10-CM | POA: Diagnosis not present

## 2023-03-28 MED ORDER — TELMISARTAN 20 MG PO TABS: 20.0000 mg | ORAL_TABLET | Freq: Every day | ORAL | 3 refills | Status: DC

## 2023-03-28 NOTE — Telephone Encounter (Signed)
Copied from CRM [id number]. Topic: Clinical - Medication Question >> Mar 27, 2023  5:25 PM [place] J wrote: Reason for CRM: Patient is calling in for an update on her medication (telmisartan (MICARDIS) 20 MG). Would like to be notified as soon as possible on when this will be authorized. Patient was also wondering if she should only take half a pill and would like further instruction provided on drug administration.

## 2023-03-28 NOTE — Telephone Encounter (Signed)
I was able to re-send the medication as requested to pts pharmacy.

## 2023-04-04 ENCOUNTER — Ambulatory Visit: Payer: Self-pay | Admitting: Internal Medicine

## 2023-04-04 NOTE — Telephone Encounter (Signed)
  Chief Complaint: low blood pressure Symptoms: little unsteady on feet  Frequency: comes and goes   Disposition: [] ED /[] Urgent Care (no appt availability in office) / [x] Appointment(In office/virtual)/ []  [facility]/ [] Home Care/ [] Refused Recommended Disposition /[] [place] [facility]/ []  Follow-up with PCP Additional Notes: Pt calling to report low blood pressure reading from eye appt today. Pt stated it was 90/55 and was told to contact PCP.  Pt had cataracts removed last Friday and today was follow-up. Pt thinks the unsteadiness is from vertigo and eye surgery. Pt denies any headache or dizziness.  Pt keeps a log of readings, and "top number is never over 112." Pt also stated she feels overwhelmed with "taking care of everyone. My sister has diabetes and I have to take her to doctor in 2 weeks. I was laying in bed last night and thought to myself, I would be better off dead." RN informed her to not dismiss those feelings and to share with provider. Pt has appt on Monday at 1000. RN advised pt to take log to office visit.   RN gave care advice and pt verbalized understanding.           Copied from CRM [id number]. Topic: Clinical - Red Word Triage >> Apr 04, 2023  2:57 PM Martinique E wrote: [name] that prompted transfer to Nurse Triage: Patient stated she has been feeling "wobbly" today with a loss of balance. Her blood pressure reading from this afternoon 2/21 was 90/55. Reason for Disposition  [1] Systolic BP 90-110 AND [2] taking blood pressure medications AND [3] NOT dizzy, lightheaded or weak  Answer Assessment - Initial Assessment Questions 1. BLOOD PRESSURE: "What is the blood pressure?" "Did you take at least two measurements 5 minutes apart?"     Last reading 90/55 2. ONSET: "When did you take your blood pressure?"     At eye dr office today 3. HOW: "How did you obtain the blood pressure?" (e.g., visiting nurse, automatic home BP monitor)     Machine  4.  HISTORY: "Do you have a history of low blood pressure?" "What is your blood pressure normally?"     " Never over 112" 5. MEDICINES: "Are you taking any medications for blood pressure?" If Yes, ask: "Have they been changed recently?"     Unsure  6. PULSE RATE: "Do you know what your pulse rate is?"      Not sure  7. OTHER SYMPTOMS: "Have you been sick recently?" "Have you had a recent injury?"     Little unsteady on feet  Protocols used: Blood Pressure - Low-A-AH

## 2023-04-07 ENCOUNTER — Ambulatory Visit: Payer: Medicare HMO | Admitting: Family Medicine

## 2023-04-07 MED ORDER — METOPROLOL SUCCINATE ER 25 MG PO TB24: 12.5000 mg | ORAL_TABLET | Freq: Every day | ORAL | Status: DC

## 2023-04-07 NOTE — Progress Notes (Deleted)
   Acute Office Visit  Subjective:     Patient ID: [name] [name], female    DOB: 01/06/1941, 83 y.o.   MRN: [id number]  No chief complaint on file.   HPI Patient is in today for ***  ROS Per HPI      Objective:    There were no vitals taken for this visit.   Physical Exam Vitals and nursing note reviewed.  Constitutional:      Appearance: Normal appearance. She is normal weight.  HENT:     Head: Normocephalic and atraumatic.     Right Ear: Tympanic membrane and ear canal normal.     Left Ear: Tympanic membrane and ear canal normal.     Nose: Nose normal.  Eyes:     Extraocular Movements: Extraocular movements intact.     Pupils: Pupils are equal, round, and reactive to light.  Cardiovascular:     Rate and Rhythm: Normal rate and regular rhythm.     Heart sounds: Normal heart sounds.  Pulmonary:     Effort: Pulmonary effort is normal.     Breath sounds: Normal breath sounds.  Musculoskeletal:        General: Normal range of motion.     Cervical back: Normal range of motion.  Neurological:     General: No focal deficit present.     Mental Status: She is alert and oriented to person, place, and time.  Psychiatric:        Mood and Affect: Mood normal.        Thought Content: Thought content normal.   No results found for any visits on 04/07/23.      Assessment & Plan:  ***  No orders of the defined types were placed in this encounter.   No follow-ups on file.  Moshe Cipro, FNP

## 2023-04-07 NOTE — Telephone Encounter (Signed)
 Reduce metoprolol to 1/2 tablet a day. Schedule office visit.  Thank you

## 2023-04-11 DIAGNOSIS — E039 Hypothyroidism, unspecified: Secondary | ICD-10-CM | POA: Diagnosis not present

## 2023-04-11 DIAGNOSIS — H2511 Age-related nuclear cataract, right eye: Secondary | ICD-10-CM | POA: Diagnosis not present

## 2023-04-11 DIAGNOSIS — I10 Essential (primary) hypertension: Secondary | ICD-10-CM | POA: Diagnosis not present

## 2023-04-18 ENCOUNTER — Telehealth: Payer: Self-pay

## 2023-04-18 NOTE — Telephone Encounter (Signed)
 Copied from CRM [id number]. Topic: Clinical - Medication Question >> Apr 18, 2023  2:45 PM [place]ique E wrote: Reason for CRM: Patient was questioning if she could get an increase of her ALPRAZolam (XANAX) 1 MG tablet as she is experiencing increased anxiety. Patient also stated that she has not felt the effects of venlafaxine XR (EFFEXOR-XR) 150 MG 24 hr capsule and would like to discontinue this medication. Callback number for patient is [phone] to discuss.

## 2023-04-21 MED ORDER — ALPRAZOLAM 1 MG PO TABS: 1.0000 mg | ORAL_TABLET | Freq: Three times a day (TID) | ORAL | 2 refills | Status: DC | PRN

## 2023-04-21 NOTE — Telephone Encounter (Signed)
 I increased the alprazolam to 3 times a day as needed. Continue on the same dose of the Effexor See me in 2-4 weeks.   Thanks

## 2023-04-22 NOTE — Addendum Note (Signed)
 Addended by: Tresa Garter on: 04/22/2023 12:00 AM   Modules accepted: Orders

## 2023-04-22 NOTE — Telephone Encounter (Signed)
 Tried to call pt, no answer. Provider has stated "I increased the alprazolam to 3 times a day as needed. Continue on the same dose of the Effexor See me in 2-4 weeks.     Thanks"

## 2023-04-25 ENCOUNTER — Other Ambulatory Visit: Payer: Self-pay | Admitting: Internal Medicine

## 2023-04-25 NOTE — Telephone Encounter (Signed)
 Copied from CRM [id number]. Topic: Clinical - Medication Refill >> Apr 25, 2023  8:26 AM Alcus Dad wrote: Most Recent Primary Care Visit:  Provider: Tresa Garter  Department: [facility]  Visit Type: OFFICE VISIT  Date: 03/20/2023  Medication: venlafaxine XR (EFFEXOR-XR) 150 MG 24 hr capsule  Has the patient contacted their pharmacy? No (Agent: If no, request that the patient contact the pharmacy for the refill. If patient does not wish to contact the pharmacy document the reason why and proceed with request.) (Agent: If yes, when and what did the pharmacy advise?)  Is this the correct pharmacy for this prescription? Yes If no, delete pharmacy and type the correct one.  This is the patient's preferred pharmacy:  [id number] [place], [place] - [address] [place] [address] Phone: [phone] Fax: [phone]  [id number] [place],  - [address] [place] [address] Phone: [phone] Fax: [phone]   Has the prescription been filled recently? No  Is the patient out of the medication? Yes  Has the patient been seen for an appointment in the last year OR does the patient have an upcoming appointment? Yes  Can we respond through MyChart? No  Agent: Please be advised that Rx refills may take up to 3 business days. We ask that you follow-up with your pharmacy.

## 2023-04-29 ENCOUNTER — Ambulatory Visit: Payer: Self-pay | Admitting: Internal Medicine

## 2023-04-29 NOTE — Telephone Encounter (Signed)
 Copied from CRM [id number]. Topic: Clinical - Red Word Triage >> Apr 29, 2023 11:30 AM Marica Otter wrote: [name] that prompted transfer to Nurse Triage: Coughing, throat hurts and feels like it's on fire

## 2023-04-29 NOTE — Telephone Encounter (Signed)
  Chief Complaint: cough Symptoms: sore throat   Disposition: [] ED /[] Urgent Care (no appt availability in office) / [x] Appointment(In office/virtual)/ []  [place] [facility]/ [] Home Care/ [] Refused Recommended Disposition /[] [place] [facility]/ []  Follow-up with PCP Additional Notes: Pt complaining of cough and sore throat. Pt states cough has been around for nearly 3 weeks. Pt denies fever/SOB. Cough can be productive at times but is clear in color. Pt states she coughed all night. Pt had eye surgery and can't get out of house until tomorrow. Pt has appt with Dr Lawerance Bach  3/19 @ 1500. No PCP appts until 3/20. RN gave care advice and pt verbalized understanding.          Copied from CRM [id number]. Topic: Clinical - Red Word Triage >> Apr 29, 2023 11:30 AM Gurney Maxin H wrote: [name] that prompted transfer to Nurse Triage: Coughing, throat hurts and feels like it's on fire Reason for Disposition  SEVERE coughing spells (e.g., whooping sound after coughing, vomiting after coughing)  Answer Assessment - Initial Assessment Questions 1. ONSET: "When did the cough begin?"      3 weeks ago  2. SEVERITY: "How bad is the cough today?"      Coughed all night  3. SPUTUM: "Describe the color of your sputum" (none, dry cough; clear, white, yellow, green)     Not sure 4. HEMOPTYSIS: "Are you coughing up any blood?" If so ask: "How much?" (flecks, streaks, tablespoons, etc.)     na 5. DIFFICULTY BREATHING: "Are you having difficulty breathing?" If Yes, ask: "How bad is it?" (e.g., mild, moderate, severe)    - MILD: No SOB at rest, mild SOB with walking, speaks normally in sentences, can lie down, no retractions, pulse < 100.    - MODERATE: SOB at rest, SOB with minimal exertion and prefers to sit, cannot lie down flat, speaks in phrases, mild retractions, audible wheezing, pulse 100-120.    - SEVERE: Very SOB at rest, speaks in single words, struggling to breathe, sitting hunched forward,  retractions, pulse > 120      Denies  6. FEVER: "Do you have a fever?" If Yes, ask: "What is your temperature, how was it measured, and when did it start?"     Denies  7. CARDIAC HISTORY: "Do you have any history of heart disease?" (e.g., heart attack, congestive heart failure)      Not sure  8. LUNG HISTORY: "Do you have any history of lung disease?"  (e.g., pulmonary embolus, asthma, emphysema)     Denies  9. PE RISK FACTORS: "Do you have a history of blood clots?" (or: recent major surgery, recent prolonged travel, bedridden)     Denies  10. OTHER SYMPTOMS: "Do you have any other symptoms?" (e.g., runny nose, wheezing, chest pain)       Sore throat  Protocols used: Cough - Acute Non-Productive-A-AH

## 2023-04-30 ENCOUNTER — Encounter: Payer: Self-pay | Admitting: Internal Medicine

## 2023-04-30 ENCOUNTER — Ambulatory Visit: Admitting: Internal Medicine

## 2023-04-30 ENCOUNTER — Ambulatory Visit (INDEPENDENT_AMBULATORY_CARE_PROVIDER_SITE_OTHER)

## 2023-04-30 VITALS — BP 126/78 | HR 78 | Temp 98.0°F | Ht 60.0 in

## 2023-04-30 DIAGNOSIS — R059 Cough, unspecified: Secondary | ICD-10-CM | POA: Diagnosis not present

## 2023-04-30 DIAGNOSIS — R052 Subacute cough: Secondary | ICD-10-CM

## 2023-04-30 DIAGNOSIS — R0989 Other specified symptoms and signs involving the circulatory and respiratory systems: Secondary | ICD-10-CM | POA: Diagnosis not present

## 2023-04-30 DIAGNOSIS — H353221 Exudative age-related macular degeneration, left eye, with active choroidal neovascularization: Secondary | ICD-10-CM | POA: Diagnosis not present

## 2023-04-30 MED ORDER — CEFUROXIME AXETIL 250 MG PO TABS
250.0000 mg | ORAL_TABLET | Freq: Two times a day (BID) | ORAL | 0 refills | Status: AC
Start: 2023-04-30 — End: 2023-05-07

## 2023-04-30 NOTE — Progress Notes (Signed)
 Subjective:    Patient ID: [name] [name], female    DOB: 1940-07-13, 83 y.o.   MRN: [id number]      HPI [name] [name] is here for  Chief Complaint  Patient presents with   Cough    Cough x 4-5 weeks; productive at time; Cough better today    She is here for an acute visit for cold symptoms.   Her symptoms started 4 weeks after they did the EGD. 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    She is experiencing nasal congestion, runny nose, sinus pressure, cough that is productive at times and headaches.  She denies fever, SOB, wheeze.   She has tried taking cough syrup   She is not a very good historian.  She is worried about her memory and plans on discussing that with Dr Posey Rea tomorrow.    Medications and allergies reviewed with patient and updated if appropriate.  Current Outpatient Medications on File Prior to Visit  Medication Sig Dispense Refill   ALPRAZolam (XANAX) 1 MG tablet Take 1 tablet (1 mg total) by mouth 3 (three) times daily as needed for anxiety. 90 tablet 2   anastrozole (ARIMIDEX) 1 MG tablet TAKE 1 TABLET BY MOUTH EVERY DAY 90 tablet 1   cholecalciferol (VITAMIN D) 1000 units tablet Take 2 tablets (2,000 Units total) by mouth daily. 100 tablet 3   clobetasol (TEMOVATE) 0.05 % external solution Apply 1 Application topically 2 (two) times daily. 50 mL 3   clopidogrel (PLAVIX) 75 MG tablet TAKE 1 TABLET (75 MG TOTAL) BY MOUTH DAILY FOR 21 DAYS. 90 tablet 3   guaiFENesin (MUCINEX) 600 MG 12 hr tablet Take 1 tablet (600 mg total) by mouth 2 (two) times daily. 60 tablet 1   levothyroxine (SYNTHROID) 112 MCG tablet TAKE 1 TABLET BY MOUTH EVERY DAY 90 tablet 3   liothyronine (CYTOMEL) 25 MCG tablet Take 0.5 tablets (12.5 mcg total) by mouth daily. 45 tablet 3   meclizine (ANTIVERT) 12.5 MG tablet TAKE 1 TABLET BY MOUTH 3 TIMES DAILY AS NEEDED FOR DIZZINESS. 60 tablet 1   metoprolol succinate (TOPROL-XL) 25 MG 24 hr tablet Take 0.5 tablets  (12.5 mg total) by mouth daily.     ofloxacin (OCUFLOX) 0.3 % ophthalmic solution Place 1 drop into the left eye 4 (four) times daily.     pantoprazole (PROTONIX) 40 MG tablet Take 1 tablet (40 mg total) by mouth 2 (two) times daily. 180 tablet 3   prednisoLONE acetate (PRED FORTE) 1 % ophthalmic suspension PLEASE SEE ATTACHED FOR DETAILED DIRECTIONS     rosuvastatin (CRESTOR) 20 MG tablet TAKE 1 TABLET BY MOUTH EVERY DAY 90 tablet 1   telmisartan (MICARDIS) 20 MG tablet Take 1 tablet (20 mg total) by mouth daily. 90 tablet 3   venlafaxine XR (EFFEXOR-XR) 150 MG 24 hr capsule TAKE 1 CAPSULE BY MOUTH DAILY WITH BREAKFAST. 90 capsule 1   vitamin B-12 (CYANOCOBALAMIN) 1000 MCG tablet Take 1,000 mcg by mouth daily.     Current Facility-Administered Medications on File Prior to Visit  Medication Dose Route Frequency Provider Last Rate Last Admin   denosumab (PROLIA) injection 60 mg  60 mg Subcutaneous Once Causey, Larna Daughters, NP        Review of Systems  Constitutional:  Negative for fever.  HENT:  Positive for congestion, hearing loss, rhinorrhea, sinus pressure, tinnitus and trouble swallowing (sometimes - certain fruits only). Negative for ear pain and  sore throat.   Respiratory:  Positive for cough (productive). Negative for chest tightness, shortness of breath and wheezing.   Neurological:  Positive for dizziness (chronic) and headaches.       Objective:   Vitals:   04/30/23 1505  BP: 126/78  Pulse: 78  Temp: 98 F (36.7 C)  SpO2: 96%   BP Readings from Last 3 Encounters:  04/30/23 126/78  03/25/23 113/77  03/20/23 120/80   Wt Readings from Last 3 Encounters:  03/20/23 148 lb (67.1 kg)  12/30/22 149 lb (67.6 kg)  12/30/22 148 lb (67.1 kg)   Body mass index is 28.9 kg/m.    Physical Exam Constitutional:      General: She is not in acute distress.    Appearance: Normal appearance. She is not ill-appearing.  HENT:     Head: Normocephalic and atraumatic.      Right Ear: Tympanic membrane, ear canal and external ear normal.     Left Ear: Tympanic membrane, ear canal and external ear normal.     Mouth/Throat:     Mouth: Mucous membranes are moist.     Pharynx: No oropharyngeal exudate or posterior oropharyngeal erythema.  Eyes:     Conjunctiva/sclera: Conjunctivae normal.  Cardiovascular:     Rate and Rhythm: Normal rate and regular rhythm.  Pulmonary:     Effort: Pulmonary effort is normal. No respiratory distress.     Breath sounds: Normal breath sounds. No wheezing or rales.  Musculoskeletal:     Cervical back: Neck supple. No tenderness.  Lymphadenopathy:     Cervical: No cervical adenopathy.  Skin:    General: Skin is warm and dry.  Neurological:     Mental Status: She is alert.            Assessment & Plan:    Cough: Subacute She has several symptoms suggestive of a possible infection - aspiration or bronchitis, but this could be gastrointestinal as well Cxr today To err on the side of caution will start ceftin 250 mg bid x 7 days Otc medications for symptom relief'   Has several other concerns and is seeing Dr Posey Rea tomorrow

## 2023-04-30 NOTE — Patient Instructions (Signed)
      Have a chest xray downstairs.    Medications changes include :   Ceftin twice daily for 7 days

## 2023-05-01 ENCOUNTER — Ambulatory Visit: Admitting: Internal Medicine

## 2023-05-01 ENCOUNTER — Ambulatory Visit: Payer: Medicare HMO

## 2023-05-02 ENCOUNTER — Ambulatory Visit: Admitting: Internal Medicine

## 2023-05-02 ENCOUNTER — Ambulatory Visit: Payer: Self-pay | Admitting: Internal Medicine

## 2023-05-02 NOTE — Telephone Encounter (Signed)
 Copied from CRM [id number]. Topic: Clinical - Pink Word Triage >> May 02, 2023  8:37 AM [name] [name] wrote: Reason for Triage: patient is having server diarrhea and has requested to speak with a nurse; patient warm transfered to nurse triage  Chief Complaint: Diarrhea Symptoms: Slight weakness Frequency: At least 3 days Pertinent Negatives: Patient denies fever Disposition: [] ED /[] Urgent Care (no appt availability in office) / [x] Appointment(In office/virtual)/ []  [place] [facility]/ [] Home Care/ [] Refused Recommended Disposition /[] [place] [facility]/ []  Follow-up with PCP Additional Notes: Patient called in to report moderate diarrhea that has been ongoing for at least 3 days. Patient was seen in the office on 04/30/23 for a cough and prescribed cefuroxime. Patient stated that the diarrhea was already present before starting the antibiotic. Patient stated she is having diarrhea at least 3 times per day and is having to wear depends for the first time in her life. Patient reported she has been able to drink plenty of fluids. Patient denied fever, abdominal pain and dizziness. Patient has been taking loperamide hydrochloride and denied relief. This RN advised patient to see a provider within 24 hours, per protocol. No availability with PCP. This RN scheduled patient with an alternate provider in office today. This RN advised patient to call back if symptoms worsen. Patient complied.   Reason for Disposition  [1] MODERATE diarrhea (e.g., 4-6 times / day more than normal) AND [2] age > 70 years  Answer Assessment - Initial Assessment Questions 1. DIARRHEA SEVERITY: "How bad is the diarrhea?" "How many more stools have you had in the past 24 hours than normal?"    - NO DIARRHEA (SCALE 0)   - MILD (SCALE 1-3): Few loose or mushy BMs; increase of 1-3 stools over normal daily number of stools; mild increase in ostomy output.   -  MODERATE (SCALE 4-7): Increase of 4-6 stools daily over normal;  moderate increase in ostomy output.   -  SEVERE (SCALE 8-10; OR "WORST POSSIBLE"): Increase of 7 or more stools daily over normal; moderate increase in ostomy output; incontinence.     Last went at 8 this morning, states she changed depends 3 time yesterdays 2. ONSET: "When did the diarrhea begin?"      3 days 3. BM CONSISTENCY: "How loose or watery is the diarrhea?"      States stool is not watery, but is not formed either  4. VOMITING: "Are you also vomiting?" If Yes, ask: "How many times in the past 24 hours?"      Denies 5. ABDOMEN PAIN: "Are you having any abdomen pain?" If Yes, ask: "What does it feel like?" (e.g., crampy, dull, intermittent, constant)      Denies 6. ABDOMEN PAIN SEVERITY: If present, ask: "How bad is the pain?"  (e.g., Scale 1-10; mild, moderate, or severe)   - MILD (1-3): doesn't interfere with normal activities, abdomen soft and not tender to touch    - MODERATE (4-7): interferes with normal activities or awakens from sleep, abdomen tender to touch    - SEVERE (8-10): excruciating pain, doubled over, unable to do any normal activities       Denies 7. ORAL INTAKE: If vomiting, "Have you been able to drink liquids?" "How much liquids have you had in the past 24 hours?"     States she is drinking plenty of water a day 8. HYDRATION: "Any signs of dehydration?" (e.g., dry mouth [not just dry lips], too weak to stand, dizziness, new weight loss) "When  did you last urinate?"     Denies dizziness and slight weakness 9. EXPOSURE: "Have you traveled to a foreign country recently?" "Have you been exposed to anyone with diarrhea?" "Could you have eaten any food that was spoiled?"     N/A 10. ANTIBIOTIC USE: "Are you taking antibiotics now or have you taken antibiotics in the past 2 months?"     Started cefuroxime on 3/19, states diarrhea started before taking antibiotic 11. OTHER SYMPTOMS: "Do you have any other symptoms?" (e.g., fever, blood in stool)     Denies blood in  stool, denies fever, states this is the first time she has had to wear depends  Protocols used: [facility]

## 2023-05-05 ENCOUNTER — Ambulatory Visit: Payer: Self-pay

## 2023-05-05 NOTE — Telephone Encounter (Signed)
  Chief Complaint: cough Symptoms: cough, congestion Frequency: ongoing for 3 weeks Pertinent Negatives: Patient denies chest pain, shortness of breath, fever Disposition: [] ED /[] Urgent Care (no appt availability in office) / [] Appointment(In office/virtual)/ []  [place] [place] [facility]/ [] Home Care/ [] Refused Recommended Disposition /[x] [place] [facility]/ []  Follow-up with PCP Additional Notes:  Evaluated on 04/30/23, she asked for cough medicine but thinks the provider "didn't hear her", she was not prescribed any medicine. Inquiring if her chest xray resulted. Coughing for approximately 3 weeks, dry cough. Mild throat pain from coughing. Using Nyquil with some relief but cough is keeping her up at night. Denies difficulty breathing, does feel short of breath when coughing only.  Denies all other symptoms. No acute visits available within 24 hours, advised Mobile [facility] and provided address and location for 05/06/23, patient in agreement with Mobile [facility] disposition. She states if she is feeling better she will not go, encourage to attend [facility] for evaluation or call back for further triage if feeling better, also call back if unable to find location of Mobile [facility]. Mobile Bus info provided: Location on 05/06/23 is [facility] [address] [place], [place] [address] They are open [phone] no appointment needed.  Care advice discussed as documented in protocol. Discussed reasons to call back.   Copied from CRM [id number]. Topic: Clinical - Red Word Triage >> May 05, 2023  5:30 PM Denese Killings wrote: Red Word that prompted transfer to Nurse Triage: Patient has been coughing continously. Symptoms- throat hurt from coughing so much, coughing for more than two weeks *Patient went through a whole bottle of Nyquil with Honey and is is not helping fully. Reason for Disposition  [1] Continuous (nonstop) coughing interferes with work or school AND [2] no improvement using cough treatment per Care  Advice  Protocols used: Cough - Acute Non-Productive-A-AH

## 2023-05-06 ENCOUNTER — Telehealth: Payer: Self-pay | Admitting: Internal Medicine

## 2023-05-06 NOTE — Telephone Encounter (Unsigned)
 Copied from CRM [id number]. Topic: Clinical - Lab/Test Results >> May 05, 2023  5:26 PM Denese Killings wrote: Reason for CRM: Patient is requesting a callback regarding xray results.

## 2023-05-07 NOTE — Telephone Encounter (Signed)
 Called and informed patient of results. They expressed understanding

## 2023-05-08 ENCOUNTER — Encounter: Payer: Self-pay | Admitting: Internal Medicine

## 2023-05-08 ENCOUNTER — Telehealth: Payer: Self-pay | Admitting: Internal Medicine

## 2023-05-08 ENCOUNTER — Ambulatory Visit (INDEPENDENT_AMBULATORY_CARE_PROVIDER_SITE_OTHER): Admitting: Internal Medicine

## 2023-05-08 VITALS — BP 98/78 | HR 97 | Temp 97.8°F | Ht 60.0 in

## 2023-05-08 DIAGNOSIS — I700 Atherosclerosis of aorta: Secondary | ICD-10-CM | POA: Diagnosis not present

## 2023-05-08 DIAGNOSIS — E785 Hyperlipidemia, unspecified: Secondary | ICD-10-CM

## 2023-05-08 DIAGNOSIS — F4321 Adjustment disorder with depressed mood: Secondary | ICD-10-CM

## 2023-05-08 DIAGNOSIS — E538 Deficiency of other specified B group vitamins: Secondary | ICD-10-CM | POA: Diagnosis not present

## 2023-05-08 DIAGNOSIS — R270 Ataxia, unspecified: Secondary | ICD-10-CM | POA: Diagnosis not present

## 2023-05-08 DIAGNOSIS — E559 Vitamin D deficiency, unspecified: Secondary | ICD-10-CM

## 2023-05-08 DIAGNOSIS — E890 Postprocedural hypothyroidism: Secondary | ICD-10-CM | POA: Diagnosis not present

## 2023-05-08 DIAGNOSIS — I25119 Atherosclerotic heart disease of native coronary artery with unspecified angina pectoris: Secondary | ICD-10-CM

## 2023-05-08 NOTE — Telephone Encounter (Signed)
 Pt stated during check-out that she needs [facility] [place].  Please and Thanks!  [place].

## 2023-05-08 NOTE — Assessment & Plan Note (Signed)
Chronic. No change ?

## 2023-05-08 NOTE — Assessment & Plan Note (Addendum)
 No angina On Plavix, Crestor, Toprol F/u w/Dr Anne Fu

## 2023-05-08 NOTE — Assessment & Plan Note (Signed)
 On Vit D

## 2023-05-08 NOTE — Progress Notes (Signed)
 Subjective:  Patient ID: [name] [name], female    DOB: 1940/06/01  Age: 83 y.o. MRN: [id number]  CC: Heart Problem (Patient would like to discuss plaque build up around heart noticed by members at [facility]. Patient has since doubled their cholesterol medication. ), Memory Loss (Patient states more recent memory loss. Patient states she was still taking the lion's mane supplements but this seems to have stopped helping), and Cough (Ongoing cough for the past month)   HPI [name] [name] would like to discuss plaque build up around heart noticed by members at [facility]. Patient has since doubled their cholesterol medication. Memory Loss (Patient states more recent memory loss. Patient states she was still taking the lion's mane supplements but this seems to have stopped helping), and Cough (Ongoing cough for the past month). C/o URI - Dr Lawerance Bach is treated w/abx  Outpatient Medications Prior to Visit  Medication Sig Dispense Refill   ALPRAZolam (XANAX) 1 MG tablet Take 1 tablet (1 mg total) by mouth 3 (three) times daily as needed for anxiety. 90 tablet 2   anastrozole (ARIMIDEX) 1 MG tablet TAKE 1 TABLET BY MOUTH EVERY DAY 90 tablet 1   cholecalciferol (VITAMIN D) 1000 units tablet Take 2 tablets (2,000 Units total) by mouth daily. 100 tablet 3   clobetasol (TEMOVATE) 0.05 % external solution Apply 1 Application topically 2 (two) times daily. 50 mL 3   clopidogrel (PLAVIX) 75 MG tablet TAKE 1 TABLET (75 MG TOTAL) BY MOUTH DAILY FOR 21 DAYS. 90 tablet 3   guaiFENesin (MUCINEX) 600 MG 12 hr tablet Take 1 tablet (600 mg total) by mouth 2 (two) times daily. 60 tablet 1   levothyroxine (SYNTHROID) 112 MCG tablet TAKE 1 TABLET BY MOUTH EVERY DAY 90 tablet 3   liothyronine (CYTOMEL) 25 MCG tablet Take 0.5 tablets (12.5 mcg total) by mouth daily. 45 tablet 3   meclizine (ANTIVERT) 12.5 MG tablet TAKE 1 TABLET BY MOUTH 3 TIMES DAILY AS NEEDED FOR DIZZINESS. 60 tablet 1   metoprolol succinate  (TOPROL-XL) 25 MG 24 hr tablet Take 0.5 tablets (12.5 mg total) by mouth daily.     ofloxacin (OCUFLOX) 0.3 % ophthalmic solution Place 1 drop into the left eye 4 (four) times daily.     pantoprazole (PROTONIX) 40 MG tablet Take 1 tablet (40 mg total) by mouth 2 (two) times daily. 180 tablet 3   prednisoLONE acetate (PRED FORTE) 1 % ophthalmic suspension PLEASE SEE ATTACHED FOR DETAILED DIRECTIONS     rosuvastatin (CRESTOR) 20 MG tablet TAKE 1 TABLET BY MOUTH EVERY DAY 90 tablet 1   telmisartan (MICARDIS) 20 MG tablet Take 1 tablet (20 mg total) by mouth daily. 90 tablet 3   venlafaxine XR (EFFEXOR-XR) 150 MG 24 hr capsule TAKE 1 CAPSULE BY MOUTH DAILY WITH BREAKFAST. 90 capsule 1   vitamin B-12 (CYANOCOBALAMIN) 1000 MCG tablet Take 1,000 mcg by mouth daily.     Facility-Administered Medications Prior to Visit  Medication Dose Route Frequency Provider Last Rate Last Admin   denosumab (PROLIA) injection 60 mg  60 mg Subcutaneous Once Causey, Larna Daughters, NP        ROS: Review of Systems  Constitutional:  Negative for activity change, appetite change, chills, fatigue and unexpected weight change.  HENT:  Negative for congestion, mouth sores and sinus pressure.   Eyes:  Negative for visual disturbance.  Respiratory:  Negative for cough and chest tightness.   Gastrointestinal:  Negative for abdominal pain and nausea.  Genitourinary:  Negative for difficulty urinating, frequency and vaginal pain.  Musculoskeletal:  Positive for gait problem. Negative for back pain.  Skin:  Negative for pallor and rash.  Neurological:  Negative for dizziness, tremors, weakness, numbness and headaches.  Hematological:  Bruises/bleeds easily.  Psychiatric/Behavioral:  Positive for decreased concentration. Negative for confusion, sleep disturbance and suicidal ideas. The patient is nervous/anxious.     Objective:  BP 98/78   Pulse 97   Temp 97.8 F (36.6 C)   Ht 5' (1.524 m)   SpO2 95%   BMI 28.90  kg/m   BP Readings from Last 3 Encounters:  05/08/23 98/78  04/30/23 126/78  03/25/23 113/77    Wt Readings from Last 3 Encounters:  03/20/23 148 lb (67.1 kg)  12/30/22 149 lb (67.6 kg)  12/30/22 148 lb (67.1 kg)    Physical Exam Constitutional:      General: She is not in acute distress.    Appearance: She is well-developed. She is ill-appearing.  HENT:     Head: Normocephalic.     Right Ear: External ear normal.     Left Ear: External ear normal.     Nose: Nose normal.  Eyes:     General:        Right eye: No discharge.        Left eye: No discharge.     Conjunctiva/sclera: Conjunctivae normal.     Pupils: Pupils are equal, round, and reactive to light.  Neck:     Thyroid: No thyromegaly.     Vascular: No JVD.     Trachea: No tracheal deviation.  Cardiovascular:     Rate and Rhythm: Normal rate and regular rhythm.     Heart sounds: Normal heart sounds.  Pulmonary:     Effort: No respiratory distress.     Breath sounds: No stridor. No wheezing.  Abdominal:     General: Bowel sounds are normal. There is no distension.     Palpations: Abdomen is soft. There is no mass.     Tenderness: There is no abdominal tenderness. There is no guarding or rebound.  Musculoskeletal:        General: No tenderness.     Cervical back: Normal range of motion and neck supple. No rigidity.     Right lower leg: No edema.     Left lower leg: No edema.  Lymphadenopathy:     Cervical: No cervical adenopathy.  Skin:    Findings: No erythema or rash.  Neurological:     Cranial Nerves: No cranial nerve deficit.     Motor: No abnormal muscle tone.     Coordination: Coordination normal.     Gait: Gait normal.     Deep Tendon Reflexes: Reflexes normal.  Psychiatric:        Behavior: Behavior normal.        Thought Content: Thought content normal.        Judgment: Judgment normal.     Lab Results  Component Value Date   WBC 7.5 03/25/2023   HGB 10.1 (L) 03/25/2023   HCT 34.5 (L)  03/25/2023   PLT 384 03/25/2023   GLUCOSE 158 (H) 03/25/2023   CHOL 179 03/20/2023   TRIG 326.0 (H) 03/20/2023   HDL 46.20 03/20/2023   LDLDIRECT 125.0 06/05/2015   LDLCALC 67 03/20/2023   ALT 15 03/25/2023   AST 21 03/25/2023   NA 140 03/25/2023   K 4.3 03/25/2023   CL 107 03/25/2023   CREATININE 1.56 (  H) 03/25/2023   BUN 22 03/25/2023   CO2 24 03/25/2023   TSH 3.53 03/20/2023   INR 1.1 08/16/2021   HGBA1C 6.2 06/20/2022    DG ESOPHAGUS W DOUBLE CM (HD) Result Date: 01/03/2023 CLINICAL DATA:  83 year old female with complaint of "coughing with meals, pills getting stuck." EXAM: ESOPHAGUS/BARIUM SWALLOW/TABLET STUDY TECHNIQUE: Combined double and single contrast examination was performed using effervescent crystals, high-density barium, and thin liquid barium. This exam was performed by [facility] PA-C, and was supervised and interpreted by Irish Lack, MD. FLUOROSCOPY: Radiation Exposure Index (as provided by the fluoroscopic device): 15.2 mGy Kerma COMPARISON:  CT CHEST ABDOMEN PELVIS 03/07/22. FINDINGS: Swallowing: Appears normal. No vestibular penetration or aspiration seen. Pharynx: Unremarkable. Esophagus: Patulous esophagus throughout. Esophageal motility: Tertiary contractions and incomplete primary stripping wave contributing to poor progression of barium bolus. Hiatal Hernia: None. Gastroesophageal reflux: Moderate gastroesophageal reflux to at least the mid thoracic esophagus. Ingested 13mm barium tablet: Became stuck and does not pass after several minutes. Other: None. IMPRESSION: 1.  Patulous esophagus 2. Tertiary contractions, poor primary stripping wave contributing to severe esophageal dysmotility likely the cause of inability of 13mm tablet to pass through to the stomach. 3.  Moderate gastroesophageal reflux. Electronically Signed   By: Irish Lack M.D.   On: 01/03/2023 12:10    Assessment & Plan:   Problem List Items Addressed This Visit     HYPOTHYROIDISM,  POSTSURGICAL   Continue Levoxyl and Cytomel. Labs are OK      B12 deficiency   On B12      Vitamin D deficiency   On Vit D      Dyslipidemia   Take Crestor as directed      Situational depression    Monitor labs closely - FT3, FT4, TSH      Aortic atherosclerosis (HCC)   On Plavix, Crestor, Toprol F/u w/Dr Anne Fu      Coronary artery disease - Primary   No angina On Plavix, Crestor, Toprol F/u w/Dr Anne Fu      Relevant Orders   Ambulatory referral to Cardiology   Ataxia   Chronic No change         No orders of the defined types were placed in this encounter.     Follow-up: Return in about 2 months (around 07/08/2023) for a follow-up visit.  Sonda Primes, MD

## 2023-05-08 NOTE — Assessment & Plan Note (Signed)
 On B12

## 2023-05-08 NOTE — Assessment & Plan Note (Signed)
 Take Crestor as directed

## 2023-05-08 NOTE — Assessment & Plan Note (Signed)
  Monitor labs closely - FT3, FT4, TSH

## 2023-05-08 NOTE — Assessment & Plan Note (Signed)
 On Plavix, Crestor, Toprol F/u w/Dr Anne Fu

## 2023-05-08 NOTE — Assessment & Plan Note (Signed)
Continue Levoxyl and Cytomel. Labs are OK

## 2023-05-09 ENCOUNTER — Encounter: Payer: Self-pay | Admitting: *Deleted

## 2023-05-09 ENCOUNTER — Telehealth: Payer: Self-pay | Admitting: Internal Medicine

## 2023-05-09 ENCOUNTER — Ambulatory Visit: Payer: Self-pay

## 2023-05-09 NOTE — Telephone Encounter (Signed)
 This encounter was created in error - please disregard.

## 2023-05-09 NOTE — Telephone Encounter (Signed)
 Chief Complaint: cough Symptoms: cough, sore throat, runny nose in the AM Frequency: since February but worse now Pertinent Negatives: Patient denies CP, SOB, swelling Disposition: [] ED /[] Urgent Care (no appt availability in office) / [] Appointment(In office/virtual)/ []  [facility]/ [] Home Care/ [] Refused Recommended Disposition /[] [facility]/ [x]  Follow-up with PCP Additional Notes: Pt reports ongoing cough since February w/ recent worsening. Pt seen 3/19 in the office for a cough. Pt seen yesterday 3/27 as well. Pt states her throat is sore from the cough. Pt states sometimes she coughs so much she feels like she is going to vomit. Pt endorsed one episode of vomiting today but states she is not nauseous, denies abd pain. Pt checked her throat for pus on her tonsils but states she didn't see any. Pt denies a fever.  Pt states the cough is dry. Pt endorses a runny nose in the AM only. Pt has been using Nyquil w/ honey and bought another OTC medication the pharmacist recommended yesterday. Pt states she used that medication once and then lost it, but states it did help. No CP or SOB. Pt is still able to swallow and eat and drink normally.  Pt stated she asked the provider on 3/19 if she could be prescribed cough medication but did not get prescribed anything at that time. Pt was prescribed antibiotics which she just finished. Pt requesting a prescription cough medicine. Since pt was seen 3.19 and yesterday 3.27, RN called the CAL. CAL said they would relay the concern. RN advised pt to follow-up w/the pharmacy and call us back for worsening.      Copied from CRM [id number]. Topic: Clinical - Pink Word Triage >> May 09, 2023 12:14 PM Truddie Crumble wrote: Reason for Triage: patient is calling about a cough that she has and she states it Is hurting her throat. Patient is trying to get some cough medicine and she has sent a message this morning regarding this. I did let the patient that  the nurse has to talk to the doctor to get that medicine approved. Answer Assessment - Initial Assessment Questions 1. ONSET: "When did the cough begin?"      Seen 3/19 for a cough w/ Dr. Lawerance Bach. Seen 3/27 w/ Dr. Posey Rea yesterday as well. States she wants to be prescribed cough medication.  2. SEVERITY: "How bad is the cough today?"      "My husband says I sound like a dark barking" 3. SPUTUM: "Describe the color of your sputum" (none, dry cough; clear, white, yellow, green)     Dry 4. HEMOPTYSIS: "Are you coughing up any blood?" If so ask: "How much?" (flecks, streaks, tablespoons, etc.)     No 5. DIFFICULTY BREATHING: "Are you having difficulty breathing?" If Yes, ask: "How bad is it?" (e.g., mild, moderate, severe)    - MILD: No SOB at rest, mild SOB with walking, speaks normally in sentences, can lie down, no retractions, pulse < 100.    - MODERATE: SOB at rest, SOB with minimal exertion and prefers to sit, cannot lie down flat, speaks in phrases, mild retractions, audible wheezing, pulse 100-120.    - SEVERE: Very SOB at rest, speaks in single words, struggling to breathe, sitting hunched forward, retractions, pulse > 120      No 6. FEVER: "Do you have a fever?" If Yes, ask: "What is your temperature, how was it measured, and when did it start?"     No 7. CARDIAC HISTORY: "Do you have any  history of heart disease?" (e.g., heart attack, congestive heart failure)      CAD, atherosclerosis, HTN 8. LUNG HISTORY: "Do you have any history of lung disease?"  (e.g., pulmonary embolus, asthma, emphysema)     Allergies, sinusitis 9. PE RISK FACTORS: "Do you have a history of blood clots?" (or: recent major surgery, recent prolonged travel, bedridden)     No 10. OTHER SYMPTOMS: "Do you have any other symptoms?" (e.g., runny nose, wheezing, chest pain)       Nose runs like crazy first thing in the AM. Vomited once today. "Sometimes I feel like if I get to coughing, I feel like I am going to  vomit." Denies nausea. Endorses a sore throat. Yesterday when she brushed her teeth she says she spit up blood but states she was rushing. No SOB. No CP. No fever. Denies congestion other than a runny nose in the AM. States her throat hurts from coughing so much, that started last week. Denies pus on her tonsils but states she just had cataract surgery so it's hard to see. Throat "just hurts", especially when clearing her throat/coughing. States she drinks water to "calm it down." Also using cough drops. "I can eat soup or something cold and that calms it down." States she has been using Nyquil w/ honey. Pharmacist yesterday suggested an OTC medication and patient bought it and took it but then lost it. States her husband is looking for it. States she coughed less last night.   Pt states she's been coughing since February. Denies swelling.  Protocols used: Cough - Acute Non-Productive-A-AH

## 2023-05-09 NOTE — Telephone Encounter (Signed)
 Copied from CRM [id number]. Topic: Clinical - Medication Question >> May 09, 2023  8:33 AM Gurney Maxin H wrote: Reason for CRM: Patient is calling in to verify if Dr. Posey Rea sent in a prescription for some cough syrup for her, was in on 3/27 for a cough and was waiting on a prescription to be sent to pharmacy the [facility] on [address]   [place] [phone]

## 2023-05-11 NOTE — Telephone Encounter (Signed)
 Use Delsym cough OTC syrup.  Office visit with any provider if problems.  Thank you

## 2023-05-13 ENCOUNTER — Telehealth: Payer: Self-pay | Admitting: Gastroenterology

## 2023-05-13 DIAGNOSIS — K224 Dyskinesia of esophagus: Secondary | ICD-10-CM

## 2023-05-13 NOTE — Telephone Encounter (Signed)
 Error

## 2023-05-13 NOTE — Telephone Encounter (Signed)
 Pts EM rescheduled to 8/27 at 10:30am.

## 2023-05-13 NOTE — Telephone Encounter (Signed)
 PT is calling because she had no idea that she has an  EM scheduled for 4/9. She wants to reschedule it because she has company coming into town. She would also like to go over the results of her last EM 01/06/2023. Please advise.

## 2023-05-13 NOTE — Telephone Encounter (Signed)
 Patient called stated she missed a call from our office not sure who called. She is asking if a nurse can call her to go over results from last year.

## 2023-05-14 NOTE — Telephone Encounter (Signed)
 Detailed message left on pts voicemail regarding new EM appt.

## 2023-05-16 NOTE — Telephone Encounter (Signed)
 Spoke to patient who wondered why she needs esophageal manometry. We discussed that she appeared to have some dysmotility on some testing last year so we need to confirm this in order to treat. Patient verbalizes understanding and relays 10/08/23 at 1030 am appointment time/date back to me.

## 2023-05-16 NOTE — Telephone Encounter (Signed)
 Inbound call from patient returning phone call. Please advise.

## 2023-05-16 NOTE — Telephone Encounter (Signed)
 Procedure:EGD Procedure date: 05/21/23 Procedure location: [facility] Arrival Time: 7:00 Spoke with the patient Y/N: Y Any prep concerns? N  Has the patient obtained the prep from the pharmacy ? N Do you have a care partner and transportation: Y Any additional concerns? N

## 2023-05-20 NOTE — Telephone Encounter (Signed)
 Use over-the-counter Delsym cough syrup.  Thank you

## 2023-05-20 NOTE — Telephone Encounter (Signed)
 Schedule office visit with any provider.  Thank you

## 2023-05-21 ENCOUNTER — Telehealth: Payer: Self-pay | Admitting: Internal Medicine

## 2023-05-21 NOTE — Telephone Encounter (Signed)
 Attempted to call out to patient and was not able to leave a voice message. Wanted to inform them of recommendation from PCP. For OTC option he was noting delsym cough syrup and if symptoms are worsening to schedule out with one of the providers at the office. PCP will not be providing any prescription for this issue at the moment.

## 2023-05-21 NOTE — Telephone Encounter (Signed)
 Copied from CRM [id number]. Topic: Clinical - Medical Advice >> May 21, 2023  2:55 PM Elizebeth Brooking wrote: Reason for CRM: Patient having a precedure tomorrow  is getting really anxious , wanted to know if Dr.Plotnikov can proscribe her something for her nerves.  ---  Routing to DOD

## 2023-05-21 NOTE — Telephone Encounter (Signed)
 Attempted to call out to patient and was not able to leave a voice message.

## 2023-05-22 ENCOUNTER — Ambulatory Visit (HOSPITAL_BASED_OUTPATIENT_CLINIC_OR_DEPARTMENT_OTHER): Admitting: Cardiology

## 2023-05-22 VITALS — BP 80/60 | HR 76 | Ht 60.0 in | Wt 148.0 lb

## 2023-05-22 DIAGNOSIS — I25119 Atherosclerotic heart disease of native coronary artery with unspecified angina pectoris: Secondary | ICD-10-CM | POA: Diagnosis not present

## 2023-05-22 DIAGNOSIS — I10 Essential (primary) hypertension: Secondary | ICD-10-CM | POA: Diagnosis not present

## 2023-05-22 DIAGNOSIS — I700 Atherosclerosis of aorta: Secondary | ICD-10-CM | POA: Diagnosis not present

## 2023-05-22 NOTE — Patient Instructions (Addendum)
 Medication Instructions:  STOP TELMISARTAN   STOP METOPROLOL   Labwork: NONE  Testing/Procedures: NONE  Follow-Up: AS NEEDED   KEEP FOLLOW UP IN MAY WITH Dr Posey Rea   Any Other Special Instructions Will Be Listed Below (If Applicable).  MONITOR YOUR BLOOD PRESSURE TWICE A DAY. TAKE READINGS TO YOUR FOLLOW UP NEXT MONTH

## 2023-05-22 NOTE — Progress Notes (Signed)
  Cardiology Office Note:  .   Date:  05/22/2023  ID:  [name] [name], DOB [birth year], MRN [id number] PCP: Tresa Garter, MD  [facility] Providers Cardiologist:  None     History of Present Illness: .   [name] [name] is a 83 y.o. female Discussed the use of AI scribe software for clinical note transcription with the patient, who gave verbal consent to proceed.  History of Present Illness [name] [name] is an 83 year old female with coronary artery disease who presents with hypotension and lethargy.  She describes a sensation of feeling 'washed out' and 'wobbly'. Her blood pressure has dropped significantly from a previous reading of 116/80 to the 80s systolic. No episodes of feeling faint. She is currently on telmisartan and metoprolol for blood pressure management. Adequate hydration is encouraged.  She has a history of coronary artery disease, with a coronary CT in 2024 revealing a calcium score of 902, placing her in the 87th percentile, and nonobstructive CAD. She is on Crestor 20 mg for hyperlipidemia, achieving an LDL level of 40, and takes clopidogrel as part of her regimen.  She has been experiencing a persistent cough since the end of February, which affects her sleep quality due to restlessness at night. She uses Xanax once daily, occasionally twice, at a dose of 1 mg to aid sleep.  She is concerned about memory issues and has discussed this with another doctor, expressing a desire for medication to help with memory, although she is not currently on any such medication.      Studies Reviewed: .        Results RADIOLOGY Coronary CT: Calcium score of 902, 87th percentile, nonobstructive CAD (2024) Chest X-ray: Clear (04/2023)-personally viewed and interpreted/ no active disease. Risk Assessment/Calculations:            Physical Exam:   VS:  BP (!) 80/60   Pulse 76   Ht 5' (1.524 m)   Wt 148 lb (67.1 kg)   SpO2 98%   BMI 28.90 kg/m    Wt Readings  from Last 3 Encounters:  05/22/23 148 lb (67.1 kg)  03/20/23 148 lb (67.1 kg)  12/30/22 149 lb (67.6 kg)    GEN: Well nourished, well developed in no acute distress NECK: No JVD; No carotid bruits CARDIAC: RRR, no murmurs, no rubs, no gallops RESPIRATORY:  Clear to auscultation without rales, wheezing or rhonchi  ABDOMEN: Soft, non-tender, non-distended EXTREMITIES:  No edema; No deformity   ASSESSMENT AND PLAN: .    Assessment and Plan Assessment & Plan Hypotension Presents with hypotension, systolic blood pressure in the 80s, accompanied by lethargy, lack of energy, and instability. Likely related to antihypertensive medications. - Discontinue telmisartan and metoprolol. - Advise monitoring blood pressure twice daily. - Encourage adequate hydration.  Coronary artery disease (CAD) Nonobstructive CAD with a calcium score of 902 (87th percentile) from a 2024 coronary CT. Managed with rosuvastatin and clopidogrel. - Continue rosuvastatin and clopidogrel.  Hyperlipidemia Excellent LDL control at 40 on rosuvastatin 20 mg daily. - Continue rosuvastatin 20 mg daily.  Anxiety Takes alprazolam 1 mg daily, occasionally twice for sleep. Reports restlessness and difficulty sleeping. Alprazolam may contribute to hypotension. - Continue alprazolam as needed for anxiety.         Signed, Donato Schultz, MD

## 2023-05-23 ENCOUNTER — Ambulatory Visit: Admitting: Internal Medicine

## 2023-05-23 ENCOUNTER — Ambulatory Visit: Admitting: Family

## 2023-05-26 ENCOUNTER — Ambulatory Visit: Admitting: Internal Medicine

## 2023-05-26 ENCOUNTER — Ambulatory Visit: Admitting: Emergency Medicine

## 2023-05-27 ENCOUNTER — Encounter: Payer: Self-pay | Admitting: Internal Medicine

## 2023-05-27 ENCOUNTER — Ambulatory Visit: Admitting: Internal Medicine

## 2023-05-27 VITALS — BP 124/72 | HR 60 | Temp 97.6°F | Ht 60.0 in

## 2023-05-27 DIAGNOSIS — J069 Acute upper respiratory infection, unspecified: Secondary | ICD-10-CM | POA: Diagnosis not present

## 2023-05-27 DIAGNOSIS — J309 Allergic rhinitis, unspecified: Secondary | ICD-10-CM

## 2023-05-27 DIAGNOSIS — F4321 Adjustment disorder with depressed mood: Secondary | ICD-10-CM | POA: Diagnosis not present

## 2023-05-27 MED ORDER — PREDNISONE 10 MG PO TABS: ORAL_TABLET | ORAL | 0 refills | Status: DC

## 2023-05-27 MED ORDER — CITALOPRAM HYDROBROMIDE 10 MG PO TABS: 10.0000 mg | ORAL_TABLET | Freq: Every day | ORAL | 3 refills | Status: DC

## 2023-05-27 MED ORDER — HYDROCODONE BIT-HOMATROP MBR 5-1.5 MG/5ML PO SOLN
5.0000 mL | Freq: Four times a day (QID) | ORAL | 0 refills | Status: AC | PRN
Start: 2023-05-27 — End: 2023-06-06

## 2023-05-27 MED ORDER — AZITHROMYCIN 250 MG PO TABS: ORAL_TABLET | ORAL | 1 refills | Status: AC

## 2023-05-27 NOTE — Patient Instructions (Signed)
 Please take all new medication as prescribed - the antibiotic, and prednisone, and cough medicine as needed  Please take all new medication as prescribed - the citalopram 10 mg per day for depressoin  Please continue all other medications as before, and refills have been done if requested.  Please have the pharmacy call with any other refills you may need.  Please keep your appointments with your specialists as you may have planned

## 2023-05-27 NOTE — Telephone Encounter (Signed)
 Date medication was needed has since passed. Patient will be following up with another provider this afternoon

## 2023-05-27 NOTE — Progress Notes (Unsigned)
 Chief Complaint: follow up acute URI, allergies, depression       HPI:  [name] [name] is a 83 y.o. female  Here with 2-3 days acute onset fever, facial pain, pressure, headache, general weakness and malaise, and greenish d/c, with mild ST and cough, but pt denies chest pain, wheezing, increased sob or doe, orthopnea, PND, increased LE swelling, palpitations, dizziness or syncope.  Does have several wks ongoing nasal allergy symptoms with clearish congestion, itch and sneezing, without fever, pain, ST, cough, swelling or wheezing.  Also admits to worsening depression over the past 3 wks, no SI or HI.         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)   HTN (hypertension)    Hyperlipidemia    Hypothyroidism    Personal history of chemotherapy 2002   Personal history of radiation therapy 2002   Psoriasis    S/P thyroidectomy 07/2005   2 cm largest diameter (1 other tiny focus)/ i-131 rx 99 [facility] 08/2005   Thyroid cancer (HCC)    Papillary Stage 1 - Dr Everardo All   Vitamin B12 deficiency 2009   Vitamin D deficiency 2009   Past Surgical History:  Procedure Laterality Date   BREAST LUMPECTOMY     BREAST LUMPECTOMY WITH RADIOACTIVE SEED AND SENTINEL LYMPH NODE BIOPSY Left 09/11/2016   Procedure: LEFT BREAST LUMPECTOMY WITH RADIOACTIVE SEED AND LEFT AXILLARY SENTINEL LYMPH NODE BIOPSY WITH BLUE DYE INJECTION;  Surgeon: Claud Kelp, MD;  Location: [facility];  Service: General;  Laterality: Left;   IR FLUORO GUIDE PORT  INSERTION RIGHT  09/13/2016   IR REMOVAL TUN ACCESS W/ PORT W/O FL MOD SED  12/30/2016   IR US GUIDE VASC ACCESS RIGHT  09/13/2016   MASTECTOMY Right    Right   PORTACATH PLACEMENT N/A 09/11/2016   Procedure: ATTEMPTED INSERTION PORT-A-CATH WITH ULTRA SOUND GUIDANCE;  Surgeon: Claud Kelp, MD;  Location: [facility];  Service: General;  Laterality: N/A;   REDUCTION MAMMAPLASTY Left 2005   THYROIDECTOMY  2007    reports that she has never smoked. She has never used smokeless tobacco. She reports that she does not drink alcohol and does not use drugs. family history includes Allergies in her daughter, mother, sister, and sister; Asthma in her brother, daughter, mother, and sister; Breast cancer (age of onset: 51) in her sister; Breast cancer (age of onset: 45) in her sister; Cancer in her paternal aunt; Clotting disorder in her mother; Heart disease (age of onset: 64) in her father; Hyperparathyroidism in her sister; Kidney cancer in her paternal aunt; Leukemia in her brother; Liver cancer in her paternal uncle; Lung cancer in her maternal grandfather; Lymphoma in her sister; Pancreatic cancer in her paternal grandfather; Stroke in her mother; Throat cancer in her maternal grandmother. Allergies  Allergen Reactions   Pneumococcal Vaccines Other (See Comments)    Was really  sick    Sulfa Antibiotics Other (See Comments)    Pt believes it was hives   Sulfacetamide Sodium-Sulfur    Sulfamethoxazole-Trimethoprim     Other reaction(s): Unknown   Tape Itching, Dermatitis and Rash   Tegaderm Ag Mesh [Silver] Itching and Rash   Current Outpatient Medications on File Prior to Visit  Medication Sig Dispense Refill   ALPRAZolam (XANAX) 1 MG tablet Take 1 tablet (1 mg total) by mouth 3 (three) times daily as needed for anxiety. 90 tablet 2   anastrozole (ARIMIDEX) 1 MG tablet TAKE 1 TABLET BY MOUTH EVERY DAY 90 tablet 1   cholecalciferol (VITAMIN D) 1000 units tablet Take 2 tablets  (2,000 Units total) by mouth daily. 100 tablet 3   clobetasol (TEMOVATE) 0.05 % external solution Apply 1 Application topically 2 (two) times daily. 50 mL 3   clopidogrel (PLAVIX) 75 MG tablet TAKE 1 TABLET (75 MG TOTAL) BY MOUTH DAILY FOR 21 DAYS. 90 tablet 3   guaiFENesin (MUCINEX) 600 MG 12 hr tablet Take 1 tablet (600 mg total) by mouth 2 (two) times daily. 60 tablet 1   levothyroxine (SYNTHROID) 112 MCG tablet TAKE 1 TABLET BY MOUTH EVERY DAY 90 tablet 3   liothyronine (CYTOMEL) 25 MCG tablet Take 0.5 tablets (12.5 mcg total) by mouth daily. 45 tablet 3   ofloxacin (OCUFLOX) 0.3 % ophthalmic solution Place 1 drop into the left eye 4 (four) times daily.     prednisoLONE acetate (PRED FORTE) 1 % ophthalmic suspension PLEASE SEE ATTACHED FOR DETAILED DIRECTIONS     rosuvastatin (CRESTOR) 20 MG tablet TAKE 1 TABLET BY MOUTH EVERY DAY 90 tablet 1   vitamin B-12 (CYANOCOBALAMIN) 1000 MCG tablet Take 1,000 mcg by mouth daily.     meclizine (ANTIVERT) 12.5 MG tablet TAKE 1 TABLET BY MOUTH 3 TIMES DAILY AS NEEDED FOR DIZZINESS. (Patient not taking: Reported on 05/27/2023) 60 tablet 1   pantoprazole (PROTONIX) 40 MG tablet Take 1 tablet (40 mg total) by mouth 2 (two) times daily. (Patient not taking: Reported on 05/27/2023) 180 tablet 3   Current Facility-Administered Medications on File Prior to Visit  Medication Dose Route Frequency Provider Last Rate Last Admin   denosumab (PROLIA) injection 60 mg  60 mg Subcutaneous Once Causey, Larna Daughters, NP            ROS:  All others reviewed and negative.  Objective        PE:  BP 124/72 (BP Location: Left Arm, Patient Position: Sitting, Cuff Size: Normal)   Pulse 60   Temp 97.6 F (36.4 C) (Oral)   Ht 5' (1.524 m)   SpO2 98%   BMI 28.90 kg/m                 Constitutional: Pt appears in mild ill               HENT: Head: NCAT.                Right Ear: External ear normal.                 Left Ear: External ear normal. Bilat tm's with mild  erythema.  Max sinus areas mild tender.  Pharynx with mild erythema, no exudate               Eyes: . Pupils are equal, round, and reactive to light. Conjunctivae and EOM are normal  Nose: without d/c or deformity               Neck: Neck supple. Gross normal ROM               Cardiovascular: Normal rate and regular rhythm.                 Pulmonary/Chest: Effort normal and breath sounds without rales or wheezing.                               Neurological: Pt is alert. At baseline orientation, motor grossly intact               Skin: Skin is warm. No rashes, no other new lesions, LE edema - none               Psychiatric: Pt behavior is normal without agitation , depressed tearful affect  Micro: none  Cardiac tracings I have personally interpreted today:  none  Pertinent Radiological findings (summarize): none   Lab Results  Component Value Date   WBC 7.5 03/25/2023   HGB 10.1 (L) 03/25/2023   HCT 34.5 (L) 03/25/2023   PLT 384 03/25/2023   GLUCOSE 158 (H) 03/25/2023   CHOL 179 03/20/2023   TRIG 326.0 (H) 03/20/2023   HDL 46.20 03/20/2023   LDLDIRECT 125.0 06/05/2015   LDLCALC 67 03/20/2023   ALT 15 03/25/2023   AST 21 03/25/2023   NA 140 03/25/2023   K 4.3 03/25/2023   CL 107 03/25/2023   CREATININE 1.56 (H) 03/25/2023   BUN 22 03/25/2023   CO2 24 03/25/2023   TSH 3.53 03/20/2023   INR 1.1 08/16/2021   HGBA1C 6.2 06/20/2022   Assessment/Plan:  [name] [name] is a 83 y.o. White or Caucasian [1] female with  has a past medical history of Allergic rhinitis, Anxiety, Breast cancer (HCC) (hx 2004), Family history of breast cancer, Genetic testing (12/05/2016), HTN (hypertension), Hyperlipidemia, Hypothyroidism, Personal history of chemotherapy (2002), Personal history of radiation therapy (2002), Psoriasis, S/P thyroidectomy (07/2005), Thyroid cancer (HCC), Vitamin B12 deficiency (2009), and Vitamin D deficiency (2009).  Upper respiratory infection Mild to mod,  for antibx course zpack x 1, cough med prn,,  to f/u any worsening symptoms or concerns  Situational depression Worsening, has been followed per psychiatry in past but  not now, for start citalopram 10 mg every day, f/u PCP  Allergic rhinitis Mild to mod, for prednisone taper,  to f/u any worsening symptoms or concerns  Followup: Return if symptoms worsen or fail to improve.  Rosalia Colonel, MD 05/28/2023 7:48 PM [facility]

## 2023-05-28 ENCOUNTER — Encounter: Payer: Self-pay | Admitting: Internal Medicine

## 2023-05-28 NOTE — Assessment & Plan Note (Signed)
 Worsening, has been followed per psychiatry in past but  not now, for start citalopram 10 mg every day, f/u PCP

## 2023-05-28 NOTE — Assessment & Plan Note (Signed)
 Mild to mod, for antibx course zpack x 1, cough med prn,,  to f/u any worsening symptoms or concerns

## 2023-05-28 NOTE — Assessment & Plan Note (Signed)
 Mild to mod, for prednisone taper, to f/u any worsening symptoms or concerns

## 2023-05-30 ENCOUNTER — Telehealth: Payer: Self-pay

## 2023-05-30 NOTE — Telephone Encounter (Signed)
 Patient called and she says her husband picked up her medications from the pharmacy and was told not to mix her medications. Patient says she takes the hydrocodone  cough syrup by itself. She says the azithromycin , citalopram , and prednisone  is it ok to take those together, advised yes it's ok to take them. She says she is going to go to the pharmacy to see what they told her husband. She says she's ok now after we reviewed those medications and explained to her how they are to be taken. She says she's doing it correctly.   Copied from CRM [id number]. Topic: Clinical - Medication Question >> May 30, 2023 11:11 AM [name] [name] wrote: Reason for CRM: Patient is calling because she has a couple questions about mixing medications that she has

## 2023-06-03 ENCOUNTER — Other Ambulatory Visit: Payer: Self-pay

## 2023-06-03 ENCOUNTER — Other Ambulatory Visit: Payer: Self-pay | Admitting: Internal Medicine

## 2023-06-03 NOTE — Telephone Encounter (Signed)
 Copied from CRM [id number]. Topic: Clinical - Medication Refill >> Jun 03, 2023 11:01 AM Zina Hilts wrote: Most Recent Primary Care Visit:  Provider: Roslyn Coombe  Department: [facility]  Visit Type: ACUTE  Date: 05/27/2023  Medication: ALPRAZolam  (XANAX ) 1 MG tablet    Has the patient contacted their pharmacy? Yes (Agent: If no, request that the patient contact the pharmacy for the refill. If patient does not wish to contact the pharmacy document the reason why and proceed with request.) (Agent: If yes, when and what did the pharmacy advise?) to have PCP submit PA request as patient is going out of town 06/04/23  Is this the correct pharmacy for this prescription? Yes If no, delete pharmacy and type the correct one.  This is the patient's preferred pharmacy:  [facility] [address] [place] - [address] [place] [address] Phone: [phone] Fax: [phone]  Has the prescription been filled recently? No  Is the patient out of the medication? Yes  Has the patient been seen for an appointment in the last year OR does the patient have an upcoming appointment? Yes  Can we respond through MyChart? No  Agent: Please be advised that Rx refills may take up to 3 business days. We ask that you follow-up with your pharmacy. >> Jun 03, 2023  5:03 PM Felizardo Hotter wrote: Pt calling for update on ALPRAZolam  (XANAX ) 1 MG tablet. Told pt its waiting approval from PCP.

## 2023-06-03 NOTE — Telephone Encounter (Unsigned)
 Copied from CRM [id number]. Topic: Clinical - Medication Refill >> Jun 03, 2023 11:01 AM Zina Hilts wrote: Most Recent Primary Care Visit:  Provider: Roslyn Coombe  Department: [facility]  Visit Type: ACUTE  Date: 05/27/2023  Medication: ALPRAZolam  (XANAX ) 1 MG tablet    Has the patient contacted their pharmacy? Yes (Agent: If no, request that the patient contact the pharmacy for the refill. If patient does not wish to contact the pharmacy document the reason why and proceed with request.) (Agent: If yes, when and what did the pharmacy advise?) to have PCP submit PA request as patient is going out of town 06/04/23  Is this the correct pharmacy for this prescription? Yes If no, delete pharmacy and type the correct one.  This is the patient's preferred pharmacy:  [facility] [address] [place] - [address] [place] [address] Phone: [phone] Fax: [phone]  Has the prescription been filled recently? No  Is the patient out of the medication? Yes  Has the patient been seen for an appointment in the last year OR does the patient have an upcoming appointment? Yes  Can we respond through MyChart? No  Agent: Please be advised that Rx refills may take up to 3 business days. We ask that you follow-up with your pharmacy. >> Jun 03, 2023  5:03 PM Felizardo Hotter wrote: Pt calling for update on ALPRAZolam  (XANAX ) 1 MG tablet. Told pt its waiting approval from PCP.

## 2023-06-04 MED ORDER — ALPRAZOLAM 1 MG PO TABS: 1.0000 mg | ORAL_TABLET | Freq: Three times a day (TID) | ORAL | 2 refills | Status: DC | PRN

## 2023-06-11 ENCOUNTER — Ambulatory Visit: Payer: Self-pay

## 2023-06-11 NOTE — Telephone Encounter (Signed)
 Chief Complaint: Back Pain  Symptoms: lower back pain left side  Frequency: Comes and goes  Pertinent Negatives: Patient denies fever, urinary symptoms  Disposition: [] ED /[] Urgent Care (no appt availability in office) / [x] Appointment(In office/virtual)/ []  [facility]/ [] Home Care/ [] Refused Recommended Disposition /[] [place] [facility]/ []  Follow-up with PCP Additional Notes: Patient reports having lower back pain on the left side that is making it difficult for her to walk upright. Patient denies any injury to the back and urinary symptoms. Care advice was given and patient has been scheduled for an appointment tomorrow.   Copied from CRM [id number]. Topic: Clinical - Red Word Triage >> Jun 11, 2023  3:58 PM Rosheka N wrote: [name] that prompted transfer to Nurse Triage: Back pain and if she bend it's hard for her to get up. Reason for Disposition  [1] MODERATE back pain (e.g., interferes with normal activities) AND [2] present > 3 days  Answer Assessment - Initial Assessment Questions 1. ONSET: "When did the pain begin?"      3-4 months  2. LOCATION: "Where does it hurt?" (upper, mid or lower back)     Lower back pain left side  3. SEVERITY: "How bad is the pain?"  (e.g., Scale 1-10; mild, moderate, or severe)   - MILD (1-3): Doesn't interfere with normal activities.    - MODERATE (4-7): Interferes with normal activities or awakens from sleep.    - SEVERE (8-10): Excruciating pain, unable to do any normal activities.      9/10 4. PATTERN: "Is the pain constant?" (e.g., yes, no; constant, intermittent)      Come and go  5. RADIATION: "Does the pain shoot into your legs or somewhere else?"     No  6. CAUSE:  "What do you think is causing the back pain?"      No 7. BACK OVERUSE:  "Any recent lifting of heavy objects, strenuous work or exercise?"     Moving around too much  8. MEDICINES: "What have you taken so far for the pain?" (e.g., nothing, acetaminophen , NSAIDS)      None 9. NEUROLOGIC SYMPTOMS: "Do you have any weakness, numbness, or problems with bowel/bladder control?"     No 10. OTHER SYMPTOMS: "Do you have any other symptoms?" (e.g., fever, abdomen pain, burning with urination, blood in urine)       Depression 11. PREGNANCY: "Is there any chance you are pregnant?" "When was your last menstrual period?"       N/A  Protocols used: Back Pain-A-AH

## 2023-06-12 ENCOUNTER — Ambulatory Visit: Admitting: Internal Medicine

## 2023-06-12 ENCOUNTER — Encounter

## 2023-06-12 NOTE — Progress Notes (Cosign Needed Addendum)
 This encounter was created in error - please disregard. Pt requested to call back to reschedule - not feeling well today  Medical screening examination/treatment/procedure(s) were performed by non-physician practitioner and as supervising physician I was immediately available for consultation/collaboration.  I agree with above. Adelaide Holy, MD

## 2023-06-17 ENCOUNTER — Encounter: Payer: Self-pay | Admitting: Internal Medicine

## 2023-06-17 ENCOUNTER — Ambulatory Visit: Payer: Medicare HMO | Admitting: Internal Medicine

## 2023-06-17 ENCOUNTER — Ambulatory Visit (INDEPENDENT_AMBULATORY_CARE_PROVIDER_SITE_OTHER): Admitting: Internal Medicine

## 2023-06-17 VITALS — BP 102/74 | HR 93 | Temp 98.3°F | Ht 60.0 in | Wt 157.4 lb

## 2023-06-17 DIAGNOSIS — F4321 Adjustment disorder with depressed mood: Secondary | ICD-10-CM | POA: Diagnosis not present

## 2023-06-17 DIAGNOSIS — R109 Unspecified abdominal pain: Secondary | ICD-10-CM | POA: Insufficient documentation

## 2023-06-17 DIAGNOSIS — F419 Anxiety disorder, unspecified: Secondary | ICD-10-CM

## 2023-06-17 DIAGNOSIS — R1084 Generalized abdominal pain: Secondary | ICD-10-CM

## 2023-06-17 DIAGNOSIS — E538 Deficiency of other specified B group vitamins: Secondary | ICD-10-CM

## 2023-06-17 MED ORDER — CITALOPRAM HYDROBROMIDE 10 MG PO TABS: 10.0000 mg | ORAL_TABLET | Freq: Every day | ORAL | 3 refills | Status: DC

## 2023-06-17 NOTE — Assessment & Plan Note (Signed)
 On B12

## 2023-06-17 NOTE — Assessment & Plan Note (Signed)
 Worse Citalopram  10 mg/d re-start Xanax  prn

## 2023-06-17 NOTE — Assessment & Plan Note (Signed)
 Nl chest CT/abd in 02/2022 Will get abd US  Labs reviewed

## 2023-06-17 NOTE — Progress Notes (Signed)
 Subjective:  Patient ID: [name] [name], female    DOB: April 03, 1940  Age: 83 y.o. MRN: [id number]  CC: Medical Management of Chronic Issues (2 Month follow up. Patient notes new onset of depression due to life changes with sister (currently hospitalized). Also having some issues with back and abdominal pain. Patient also has had unexplained weight gain over the last few weeks)   HPI [name] [name] presents for 2 Month follow up. Patient notes new onset of depression due to life changes with sister (currently hospitalized). Also having some issues with back and abdominal pain. Patient also has had unexplained weight gain over the last few weeks) C/o stress, bloating  Outpatient Medications Prior to Visit  Medication Sig Dispense Refill   ALPRAZolam  (XANAX ) 1 MG tablet Take 1 tablet (1 mg total) by mouth 3 (three) times daily as needed for anxiety. 90 tablet 2   anastrozole  (ARIMIDEX ) 1 MG tablet TAKE 1 TABLET BY MOUTH EVERY DAY 90 tablet 1   cholecalciferol (VITAMIN D ) 1000 units tablet Take 2 tablets (2,000 Units total) by mouth daily. 100 tablet 3   clobetasol  (TEMOVATE ) 0.05 % external solution Apply 1 Application topically 2 (two) times daily. 50 mL 3   clopidogrel  (PLAVIX ) 75 MG tablet TAKE 1 TABLET (75 MG TOTAL) BY MOUTH DAILY FOR 21 DAYS. 90 tablet 3   guaiFENesin  (MUCINEX ) 600 MG 12 hr tablet Take 1 tablet (600 mg total) by mouth 2 (two) times daily. 60 tablet 1   levothyroxine  (SYNTHROID ) 112 MCG tablet TAKE 1 TABLET BY MOUTH EVERY DAY 90 tablet 3   liothyronine  (CYTOMEL ) 25 MCG tablet Take 0.5 tablets (12.5 mcg total) by mouth daily. 45 tablet 3   ofloxacin  (OCUFLOX ) 0.3 % ophthalmic solution Place 1 drop into the left eye 4 (four) times daily.     prednisoLONE acetate (PRED FORTE) 1 % ophthalmic suspension PLEASE SEE ATTACHED FOR DETAILED DIRECTIONS     predniSONE  (DELTASONE ) 10 MG tablet 3 tabs by mouth per day for 3 days,2tabs per day for 3 days,1tab per day for 3 days 18 tablet  0   rosuvastatin  (CRESTOR ) 20 MG tablet TAKE 1 TABLET BY MOUTH EVERY DAY 90 tablet 1   vitamin B-12 (CYANOCOBALAMIN ) 1000 MCG tablet Take 1,000 mcg by mouth daily.     citalopram  (CELEXA ) 10 MG tablet Take 1 tablet (10 mg total) by mouth daily. 90 tablet 3   meclizine  (ANTIVERT ) 12.5 MG tablet TAKE 1 TABLET BY MOUTH 3 TIMES DAILY AS NEEDED FOR DIZZINESS. (Patient not taking: Reported on 06/17/2023) 60 tablet 1   pantoprazole  (PROTONIX ) 40 MG tablet Take 1 tablet (40 mg total) by mouth 2 (two) times daily. (Patient not taking: Reported on 05/22/2023) 180 tablet 3   Facility-Administered Medications Prior to Visit  Medication Dose Route Frequency Provider Last Rate Last Admin   denosumab  (PROLIA ) injection 60 mg  60 mg Subcutaneous Once Causey, Lindsey Cornetto, NP        ROS: Review of Systems  Constitutional:  Negative for activity change, appetite change, chills, fatigue and unexpected weight change.  HENT:  Negative for congestion, mouth sores and sinus pressure.   Eyes:  Negative for visual disturbance.  Respiratory:  Negative for cough and chest tightness.   Gastrointestinal:  Negative for abdominal pain and nausea.  Genitourinary:  Negative for difficulty urinating, frequency and vaginal pain.  Musculoskeletal:  Positive for arthralgias and gait problem. Negative for back pain.  Skin:  Negative for pallor and rash.  Neurological:  Negative for dizziness, tremors, weakness, numbness and headaches.  Psychiatric/Behavioral:  Positive for dysphoric mood. Negative for confusion, sleep disturbance and suicidal ideas. The patient is nervous/anxious.     Objective:  BP 102/74   Pulse 93   Temp 98.3 F (36.8 C)   Ht 5' (1.524 m)   Wt 157 lb 6.4 oz (71.4 kg)   SpO2 97%   BMI 30.74 kg/m   BP Readings from Last 3 Encounters:  06/17/23 102/74  05/27/23 124/72  05/22/23 (!) 80/60    Wt Readings from Last 3 Encounters:  06/17/23 157 lb 6.4 oz (71.4 kg)  05/22/23 148 lb (67.1 kg)   03/20/23 148 lb (67.1 kg)    Physical Exam Constitutional:      General: She is not in acute distress.    Appearance: She is well-developed.  HENT:     Head: Normocephalic.     Right Ear: External ear normal.     Left Ear: External ear normal.     Nose: Nose normal.  Eyes:     General:        Right eye: No discharge.        Left eye: No discharge.     Conjunctiva/sclera: Conjunctivae normal.     Pupils: Pupils are equal, round, and reactive to light.  Neck:     Thyroid : No thyromegaly.     Vascular: No JVD.     Trachea: No tracheal deviation.  Cardiovascular:     Rate and Rhythm: Normal rate and regular rhythm.     Heart sounds: Normal heart sounds.  Pulmonary:     Effort: No respiratory distress.     Breath sounds: No stridor. No wheezing.  Abdominal:     General: Bowel sounds are normal. There is no distension.     Palpations: Abdomen is soft. There is no mass.     Tenderness: There is no abdominal tenderness. There is no guarding or rebound.  Musculoskeletal:        General: No tenderness.     Cervical back: Normal range of motion and neck supple. No rigidity.  Lymphadenopathy:     Cervical: No cervical adenopathy.  Skin:    Findings: No erythema or rash.  Neurological:     Mental Status: Mental status is at baseline.     Cranial Nerves: No cranial nerve deficit.     Motor: No abnormal muscle tone.     Coordination: Coordination normal.     Gait: Gait abnormal.     Deep Tendon Reflexes: Reflexes normal.  Psychiatric:        Behavior: Behavior normal.        Thought Content: Thought content normal.        Judgment: Judgment normal.   Upper abd is sensitive, no mass  Lab Results  Component Value Date   WBC 7.5 03/25/2023   HGB 10.1 (L) 03/25/2023   HCT 34.5 (L) 03/25/2023   PLT 384 03/25/2023   GLUCOSE 158 (H) 03/25/2023   CHOL 179 03/20/2023   TRIG 326.0 (H) 03/20/2023   HDL 46.20 03/20/2023   LDLDIRECT 125.0 06/05/2015   LDLCALC 67 03/20/2023    ALT 15 03/25/2023   AST 21 03/25/2023   NA 140 03/25/2023   K 4.3 03/25/2023   CL 107 03/25/2023   CREATININE 1.56 (H) 03/25/2023   BUN 22 03/25/2023   CO2 24 03/25/2023   TSH 3.53 03/20/2023   INR 1.1 08/16/2021   HGBA1C 6.2 06/20/2022  DG ESOPHAGUS W DOUBLE CM (HD) Result Date: 01/03/2023 CLINICAL DATA:  83 year old female with complaint of "coughing with meals, pills getting stuck." EXAM: ESOPHAGUS/BARIUM SWALLOW/TABLET STUDY TECHNIQUE: Combined double and single contrast examination was performed using effervescent crystals, high-density barium, and thin liquid barium. This exam was performed by Kacie Matthwes PA-C, and was supervised and interpreted by Erica Hau, MD. FLUOROSCOPY: Radiation Exposure Index (as provided by the fluoroscopic device): 15.2 mGy Kerma COMPARISON:  CT CHEST ABDOMEN PELVIS 03/07/22. FINDINGS: Swallowing: Appears normal. No vestibular penetration or aspiration seen. Pharynx: Unremarkable. Esophagus: Patulous esophagus throughout. Esophageal motility: Tertiary contractions and incomplete primary stripping wave contributing to poor progression of barium bolus. Hiatal Hernia: None. Gastroesophageal reflux: Moderate gastroesophageal reflux to at least the mid thoracic esophagus. Ingested 13mm barium tablet: Became stuck and does not pass after several minutes. Other: None. IMPRESSION: 1.  Patulous esophagus 2. Tertiary contractions, poor primary stripping wave contributing to severe esophageal dysmotility likely the cause of inability of 13mm tablet to pass through to the stomach. 3.  Moderate gastroesophageal reflux. Electronically Signed   By: Erica Hau M.D.   On: 01/03/2023 12:10    Assessment & Plan:   Problem List Items Addressed This Visit     B12 deficiency   On B12      Anxiety disorder   Worse Citalopram  10 mg/d re-start Xanax  prn      Relevant Medications   citalopram  (CELEXA ) 10 MG tablet   Situational depression   Worse Citalopram  10  mg/d re-start Xanax  prn      Relevant Medications   citalopram  (CELEXA ) 10 MG tablet   Abdominal pain - Primary   Nl chest CT/abd in 02/2022 Will get abd US  Labs reviewed      Relevant Orders   US  Abdomen Complete      Meds ordered this encounter  Medications   citalopram  (CELEXA ) 10 MG tablet    Sig: Take 1 tablet (10 mg total) by mouth daily.    Dispense:  90 tablet    Refill:  3      Follow-up: Return in about 2 months (around 08/17/2023) for a follow-up visit.  Anitra Barn, MD

## 2023-06-19 ENCOUNTER — Encounter: Payer: Self-pay | Admitting: Internal Medicine

## 2023-06-20 ENCOUNTER — Ambulatory Visit: Payer: Self-pay

## 2023-06-20 NOTE — Telephone Encounter (Signed)
  Chief Complaint: itchiness Symptoms: itchy, flaky skin to legs/arms/chest/scalp Frequency: x 2 days Pertinent Negatives: Patient denies change in soap/laundry detergent/hygiene products, facial swelling, difficulty breathing Disposition: [] ED /[] Urgent Care (no appt availability in office) / [] Appointment(In office/virtual)/ []  [facility]/ [] Home Care/ [] Refused Recommended Disposition /[] [facility]/ [x]  Follow-up with PCP Additional Notes: Patient calling in for triage and states she would like to try an OTC anti itch lotion. Advised patient apply lotion and gave additional home care advice. Called [facility] and notified staff member, Erin protocol recommends call PCP.  Copied from CRM [id number]. Topic: Clinical - Red Word Triage >> Jun 20, 2023  1:18 PM [name] [name] wrote: Red Word that prompted transfer to Nurse Triage: Wants to know if medication citalopram  (CELEXA ) 10 MG tablet causes itching. Reason for Disposition  Taking prescription medication that could cause itching (e.g., codeine/morphine/other opiates, aspirin )  Answer Assessment - Initial Assessment Questions 1. DESCRIPTION: "Describe the itching you are having."     Legs, chest, arms, scalp  2. SEVERITY: "How bad is it?"    - MILD: Doesn't interfere with normal activities.   - MODERATE-SEVERE: Interferes with work, school, sleep, or other activities.      Severe at night, wasn't able to sleep more than 4 hours.   3. SCRATCHING: "Are there any scratch marks? Bleeding?"     Denies.  4. ONSET: "When did this begin?"      Yesterday morning.  5. CAUSE: "What do you think is causing the itching?" (ask about swimming pools, pollen, animals, soaps, etc.)     She is concerned it could a reaction to her new medication, Celexa .  6. OTHER SYMPTOMS: "Do you have any other symptoms?"      Denies.  7. PREGNANCY: "Is there any chance you are pregnant?" "When was your last menstrual period?"     N/A.  Protocols  used: Itching - Widespread-A-AH

## 2023-06-23 NOTE — Telephone Encounter (Signed)
 I am sorry about the rash.  Use Benadryl as needed.  Office visit with me or another provider.  Thank you

## 2023-06-24 NOTE — Telephone Encounter (Signed)
 I was able to speak with the [name] [name] and inform her Dr. Tami Falcon advice and she has stated she is 100% better.

## 2023-06-25 ENCOUNTER — Encounter: Payer: Self-pay | Admitting: Oncology

## 2023-06-27 ENCOUNTER — Ambulatory Visit
Admission: RE | Admit: 2023-06-27 | Discharge: 2023-06-27 | Disposition: A | Discharge status: Home / Self Care | Source: Ambulatory Visit | Attending: Internal Medicine | Admitting: Internal Medicine

## 2023-06-27 DIAGNOSIS — R109 Unspecified abdominal pain: Secondary | ICD-10-CM | POA: Diagnosis not present

## 2023-06-27 DIAGNOSIS — K760 Fatty (change of) liver, not elsewhere classified: Secondary | ICD-10-CM | POA: Diagnosis not present

## 2023-06-27 DIAGNOSIS — R140 Abdominal distension (gaseous): Secondary | ICD-10-CM | POA: Diagnosis not present

## 2023-06-30 ENCOUNTER — Ambulatory Visit: Payer: Self-pay | Admitting: Internal Medicine

## 2023-07-02 DIAGNOSIS — H353221 Exudative age-related macular degeneration, left eye, with active choroidal neovascularization: Secondary | ICD-10-CM | POA: Diagnosis not present

## 2023-07-04 ENCOUNTER — Telehealth: Payer: Self-pay | Admitting: Internal Medicine

## 2023-07-04 NOTE — Telephone Encounter (Signed)
 Copied from CRM [id number]. Topic: Clinical - Medical Advice >> Jul 04, 2023 10:36 AM Alyse July wrote: Reason for CRM: Patient would like a call back to further discuss her U/S results. Patient was informed of notation per provider "Please inform the patient that her recent abdominal ultrasound shows chronic changes in the liver (fatty liver) and kidneys -no change in plans. There is no cancer." But would like a call back to further discuss recommendations. Contact number confirmed: [phone].

## 2023-07-04 NOTE — Telephone Encounter (Signed)
 Copied from CRM [id number]. Topic: Clinical - Prescription Issue >> Jul 04, 2023  1:50 PM Turkey A wrote: Reason for CRM: Patient called and said that Citalopram  10mg  has her up most of the night and she can not rest and her hand will shake sometimes.

## 2023-07-08 ENCOUNTER — Telehealth: Payer: Self-pay | Admitting: Internal Medicine

## 2023-07-08 NOTE — Telephone Encounter (Unsigned)
 Copied from CRM [id number]. Topic: Clinical - Lab/Test Results >> Jul 08, 2023 12:17 PM Earnestine Goes B wrote: Reason for CRM: pt called for imaging results please call pt back at [phone]

## 2023-07-09 ENCOUNTER — Ambulatory Visit: Admitting: Internal Medicine

## 2023-07-09 ENCOUNTER — Ambulatory Visit (INDEPENDENT_AMBULATORY_CARE_PROVIDER_SITE_OTHER): Admitting: Family Medicine

## 2023-07-09 ENCOUNTER — Encounter: Payer: Self-pay | Admitting: Family Medicine

## 2023-07-09 ENCOUNTER — Ambulatory Visit

## 2023-07-09 VITALS — BP 114/70 | HR 116 | Ht 60.0 in

## 2023-07-09 DIAGNOSIS — M25462 Effusion, left knee: Secondary | ICD-10-CM | POA: Diagnosis not present

## 2023-07-09 DIAGNOSIS — M25562 Pain in left knee: Secondary | ICD-10-CM

## 2023-07-09 DIAGNOSIS — W19XXXA Unspecified fall, initial encounter: Secondary | ICD-10-CM

## 2023-07-09 DIAGNOSIS — K625 Hemorrhage of anus and rectum: Secondary | ICD-10-CM | POA: Diagnosis not present

## 2023-07-09 DIAGNOSIS — L2084 Intrinsic (allergic) eczema: Secondary | ICD-10-CM

## 2023-07-09 DIAGNOSIS — S82092A Other fracture of left patella, initial encounter for closed fracture: Secondary | ICD-10-CM | POA: Diagnosis not present

## 2023-07-09 DIAGNOSIS — R109 Unspecified abdominal pain: Secondary | ICD-10-CM | POA: Diagnosis not present

## 2023-07-09 LAB — CBC WITH DIFFERENTIAL/PLATELET
Basophils Absolute: 0 10*3/uL (ref 0.0–0.1)
Basophils Relative: 0.3 % (ref 0.0–3.0)
Eosinophils Absolute: 0.1 10*3/uL (ref 0.0–0.7)
Eosinophils Relative: 0.5 % (ref 0.0–5.0)
HCT: 31.6 % — ABNORMAL LOW (ref 36.0–46.0)
Hemoglobin: 9.8 g/dL — ABNORMAL LOW (ref 12.0–15.0)
Lymphocytes Relative: 22.3 % (ref 12.0–46.0)
Lymphs Abs: 2.2 10*3/uL (ref 0.7–4.0)
MCHC: 31 g/dL (ref 30.0–36.0)
MCV: 75.2 fl — ABNORMAL LOW (ref 78.0–100.0)
Monocytes Absolute: 0.7 10*3/uL (ref 0.1–1.0)
Monocytes Relative: 7.1 % (ref 3.0–12.0)
Neutro Abs: 6.9 10*3/uL (ref 1.4–7.7)
Neutrophils Relative %: 69.8 % (ref 43.0–77.0)
Platelets: 392 10*3/uL (ref 150.0–400.0)
RBC: 4.21 Mil/uL (ref 3.87–5.11)
RDW: 16.5 % — ABNORMAL HIGH (ref 11.5–15.5)
WBC: 9.9 10*3/uL (ref 4.0–10.5)

## 2023-07-09 LAB — COMPREHENSIVE METABOLIC PANEL WITH GFR
ALT: 15 U/L (ref 0–35)
AST: 27 U/L (ref 0–37)
Albumin: 4.3 g/dL (ref 3.5–5.2)
Alkaline Phosphatase: 53 U/L (ref 39–117)
BUN: 21 mg/dL (ref 6–23)
CO2: 23 meq/L (ref 19–32)
Calcium: 9.6 mg/dL (ref 8.4–10.5)
Chloride: 105 meq/L (ref 96–112)
Creatinine, Ser: 1.47 mg/dL — ABNORMAL HIGH (ref 0.40–1.20)
GFR: 32.63 mL/min — ABNORMAL LOW (ref >60.00)
Glucose, Bld: 190 mg/dL — ABNORMAL HIGH (ref 70–99)
Potassium: 3.9 meq/L (ref 3.5–5.1)
Sodium: 136 meq/L (ref 135–145)
Total Bilirubin: 0.5 mg/dL (ref 0.2–1.2)
Total Protein: 7.4 g/dL (ref 6.0–8.3)

## 2023-07-09 MED ORDER — TRIAMCINOLONE ACETONIDE 0.1 % EX CREA: 1.0000 | TOPICAL_CREAM | Freq: Two times a day (BID) | CUTANEOUS | 0 refills | Status: DC

## 2023-07-09 NOTE — Progress Notes (Signed)
 Acute Office Visit  Subjective:     Patient ID: [name] [name], female    DOB: [birth year], 83 y.o.   MRN: [id number]  Chief Complaint  Patient presents with   Rectal Bleeding    Rectal (bright red) bleeding Sunday. Noted seeing blood when wiping after bowel movement. Does note heavy constipation two days prior to this event. Had tried laxitive to no success. Then attempted enema and believed this is what caused the the injury.   Bowel movements have are back to normal, patient is requesting imaging if possible (has had recent ultrasound of abdomen 06/27/23)    HPI Patient is in today for multiple pulm complaints. Reports that Saturday she took a laxative due to constipation and then when she had a bowel movement on Sunday there was blood in the toilet.  Describes as frank red blood, also describes red blood upon wiping. Reports that she used an enema and that that was also painful. Reports some pain with sitting. States that bowel movements have now returned to normal. Denies dizziness, continued bleeding, palpitations, shortness of breath, easy bruising or petechiae.  Reports eczema and itching to both hands.  Has been using [employer] lotion. Denies any new exposures. Denies other rash, other symptoms.  Reports that upon arrival to our office, she was getting out of the passenger seat of their vehicle, her shoes were slick and she missed the curb and fell onto both knees onto the ground.  Reports left knee pain, swelling and tenderness. Has not attempted treatment, is in wheelchair for visit. Denies other concerns today. Medical history as outlined below.  ROS Per HPI      Objective:    BP 114/70   Pulse (!) 116   Ht 5' (1.524 m)   SpO2 99%   BMI 30.74 kg/m    Physical Exam Vitals and nursing note reviewed.  Constitutional:      General: She is not in acute distress. HENT:     Head: Normocephalic and atraumatic.     Right Ear: External ear normal.     Left  Ear: External ear normal.     Nose: Nose normal.     Mouth/Throat:     Mouth: Mucous membranes are moist.     Pharynx: Oropharynx is clear.  Eyes:     Extraocular Movements: Extraocular movements intact.     Pupils: Pupils are equal, round, and reactive to light.  Cardiovascular:     Rate and Rhythm: Normal rate and regular rhythm.     Pulses: Normal pulses.     Heart sounds: Normal heart sounds.  Pulmonary:     Effort: Pulmonary effort is normal. No respiratory distress.     Breath sounds: Normal breath sounds. No wheezing, rhonchi or rales.  Musculoskeletal:        General: Swelling and tenderness present.     Cervical back: Normal range of motion.     Right lower leg: No edema.     Left lower leg: No edema.     Comments: In wheelchair for visit Swelling noted to superior medial aspect of the left knee, bony tenderness to left patella, bruising   Lymphadenopathy:     Cervical: No cervical adenopathy.  Neurological:     General: No focal deficit present.     Mental Status: She is alert and oriented to person, place, and time.  Psychiatric:        Mood and Affect: Mood normal.  Thought Content: Thought content normal.    Results for orders placed or performed in visit on 07/09/23  CBC with Differential/Platelet  Result Value Ref Range   WBC 9.9 4.0 - 10.5 K/uL   RBC 4.21 3.87 - 5.11 Mil/uL   Hemoglobin 9.8 (L) 12.0 - 15.0 g/dL   HCT 16.1 (L) 09.6 - 04.5 %   MCV 75.2 (L) 78.0 - 100.0 fl   MCHC 31.0 30.0 - 36.0 g/dL   RDW 40.9 (H) 81.1 - 91.4 %   Platelets 392.0 150.0 - 400.0 K/uL   Neutrophils Relative % 69.8 43.0 - 77.0 %   Lymphocytes Relative 22.3 12.0 - 46.0 %   Monocytes Relative 7.1 3.0 - 12.0 %   Eosinophils Relative 0.5 0.0 - 5.0 %   Basophils Relative 0.3 0.0 - 3.0 %   Neutro Abs 6.9 1.4 - 7.7 K/uL   Lymphs Abs 2.2 0.7 - 4.0 K/uL   Monocytes Absolute 0.7 0.1 - 1.0 K/uL   Eosinophils Absolute 0.1 0.0 - 0.7 K/uL   Basophils Absolute 0.0 0.0 - 0.1 K/uL   Comprehensive metabolic panel with GFR  Result Value Ref Range   Sodium 136 135 - 145 mEq/L   Potassium 3.9 3.5 - 5.1 mEq/L   Chloride 105 96 - 112 mEq/L   CO2 23 19 - 32 mEq/L   Glucose, Bld 190 (H) 70 - 99 mg/dL   BUN 21 6 - 23 mg/dL   Creatinine, Ser 7.82 (H) 0.40 - 1.20 mg/dL   Total Bilirubin 0.5 0.2 - 1.2 mg/dL   Alkaline Phosphatase 53 39 - 117 U/L   AST 27 0 - 37 U/L   ALT 15 0 - 35 U/L   Total Protein 7.4 6.0 - 8.3 g/dL   Albumin 4.3 3.5 - 5.2 g/dL   GFR 95.62 (L) >13.08 mL/min   Calcium  9.6 8.4 - 10.5 mg/dL        Assessment & Plan:   Rectal bleeding -     CBC with Differential/Platelet -     Fecal occult blood, imunochemical; Future -     CBC with Differential/Platelet -     Comprehensive metabolic panel with GFR -     DG Abd 1 View; Future  Intrinsic eczema -     Comprehensive metabolic panel with GFR -     Triamcinolone  Acetonide; Apply 1 Application topically 2 (two) times daily.  Dispense: 30 g; Refill: 0  Fall, initial encounter -     DG Knee 1-2 Views Left; Future -     Ambulatory referral to Sports Medicine  Acute pain of left knee -     DG Knee 1-2 Views Left; Future -     Ambulatory referral to Sports Medicine  Closed sleeve fracture of left patella, initial encounter -     Ambulatory referral to Sports Medicine     Meds ordered this encounter  Medications   triamcinolone  cream (KENALOG ) 0.1 %    Sig: Apply 1 Application topically 2 (two) times daily.    Dispense:  30 g    Refill:  0    Return if symptoms worsen or fail to improve, for PCP as scheduled.  Wellington Half, FNP

## 2023-07-09 NOTE — Telephone Encounter (Signed)
Addressed in result management note.

## 2023-07-09 NOTE — Telephone Encounter (Signed)
 Discussed with patient during acute visit with Augustus Ledger FNP

## 2023-07-09 NOTE — Telephone Encounter (Signed)
 No immediate new plans.  Hydrate well.  Continue to stay on a good diet.  Please keep ROV with me.  Thank you

## 2023-07-11 ENCOUNTER — Telehealth: Payer: Self-pay | Admitting: Internal Medicine

## 2023-07-11 ENCOUNTER — Ambulatory Visit: Payer: Self-pay | Admitting: Family Medicine

## 2023-07-11 ENCOUNTER — Ambulatory Visit: Admitting: Family Medicine

## 2023-07-11 ENCOUNTER — Telehealth: Payer: Self-pay | Admitting: Family Medicine

## 2023-07-11 VITALS — BP 118/80 | HR 120 | Ht 60.0 in

## 2023-07-11 DIAGNOSIS — S82035A Nondisplaced transverse fracture of left patella, initial encounter for closed fracture: Secondary | ICD-10-CM | POA: Diagnosis not present

## 2023-07-11 DIAGNOSIS — K625 Hemorrhage of anus and rectum: Secondary | ICD-10-CM

## 2023-07-11 DIAGNOSIS — S82092A Other fracture of left patella, initial encounter for closed fracture: Secondary | ICD-10-CM | POA: Diagnosis not present

## 2023-07-11 DIAGNOSIS — D509 Iron deficiency anemia, unspecified: Secondary | ICD-10-CM

## 2023-07-11 NOTE — Patient Instructions (Addendum)
 Thank you for coming in today.   Tylenol  Arthritis  I've referred you to Orthopedic Surgery.  Let us  know if you don't hear from them in one week.

## 2023-07-11 NOTE — Telephone Encounter (Signed)
 Copied from CRM [id number]. Topic: Clinical - Medical Advice >> Jul 11, 2023  2:40 PM [name] [name] wrote: Reason for CRM: pt called to speak with provider to advise him she has a fracture and has a small cast requesting a call back at [phone]

## 2023-07-11 NOTE — Progress Notes (Signed)
 Joanna Muck, PhD, LAT, ATC acting as a scribe for Garlan Juniper, MD.  [name] [name] is a 83 y.o. female who presents to [facility] at [facility] today for L knee pain do to a transverse fx patella. Pt suffered a fall yesterday landing on both of her knees, mostly the L.   She also notes that she is having trouble controlling the pain.  She has not really tried anything for pain yet.  She cannot take ibuprofen due to kidney disease but has not tried Tylenol  yet.  Swelling: unsure Treatments tried: ice, elevation  Dx imaging: 07/09/23 L knee XR  Pertinent review of systems: No fevers or chills  Relevant historical information: Chronic kidney disease.   Exam:  BP 118/80   Pulse (!) 120   Ht 5' (1.524 m)   BMI 30.74 kg/m  General: Well Developed, well nourished, and in no acute distress.   MSK: Left leg normal-appearing Tender palpation patella.  Decreased range of motion.  Weakness to gentle resisted strength testing knee extension.  Patient is seated in a wheelchair.    Lab and Radiology Results   Results for orders placed or performed in visit on 07/09/23 (from the past 72 hours)  CBC with Differential/Platelet     Status: Abnormal   Collection Time: 07/09/23  3:38 PM  Result Value Ref Range   WBC 9.9 4.0 - 10.5 K/uL   RBC 4.21 3.87 - 5.11 Mil/uL   Hemoglobin 9.8 (L) 12.0 - 15.0 g/dL   HCT 16.1 (L) 09.6 - 04.5 %   MCV 75.2 (L) 78.0 - 100.0 fl   MCHC 31.0 30.0 - 36.0 g/dL   RDW 40.9 (H) 81.1 - 91.4 %   Platelets 392.0 150.0 - 400.0 K/uL   Neutrophils Relative % 69.8 43.0 - 77.0 %   Lymphocytes Relative 22.3 12.0 - 46.0 %   Monocytes Relative 7.1 3.0 - 12.0 %   Eosinophils Relative 0.5 0.0 - 5.0 %   Basophils Relative 0.3 0.0 - 3.0 %   Neutro Abs 6.9 1.4 - 7.7 K/uL   Lymphs Abs 2.2 0.7 - 4.0 K/uL   Monocytes Absolute 0.7 0.1 - 1.0 K/uL   Eosinophils Absolute 0.1 0.0 - 0.7 K/uL   Basophils Absolute 0.0 0.0 - 0.1 K/uL  Comprehensive  metabolic panel with GFR     Status: Abnormal   Collection Time: 07/09/23  3:38 PM  Result Value Ref Range   Sodium 136 135 - 145 mEq/L   Potassium 3.9 3.5 - 5.1 mEq/L   Chloride 105 96 - 112 mEq/L   CO2 23 19 - 32 mEq/L   Glucose, Bld 190 (H) 70 - 99 mg/dL   BUN 21 6 - 23 mg/dL   Creatinine, Ser 7.82 (H) 0.40 - 1.20 mg/dL   Total Bilirubin 0.5 0.2 - 1.2 mg/dL   Alkaline Phosphatase 53 39 - 117 U/L   AST 27 0 - 37 U/L   ALT 15 0 - 35 U/L   Total Protein 7.4 6.0 - 8.3 g/dL   Albumin 4.3 3.5 - 5.2 g/dL   GFR 95.62 (L) >13.08 mL/min    Comment: Calculated using the CKD-EPI Creatinine Equation (2021)   Calcium  9.6 8.4 - 10.5 mg/dL   *Note: Due to a large number of results and/or encounters for the requested time period, some results have not been displayed. A complete set of results can be found in Results Review.   DG Knee 1-2 Views Left Result  Date: 07/09/2023 CLINICAL DATA:  [name] today.  Left knee pain. EXAM: LEFT KNEE - 1-2 VIEW COMPARISON:  None Available. FINDINGS: Transverse, non comminuted and nondisplaced fracture of the mid patella. No other fracture. No bone lesion. Joint is normally spaced and aligned. Trace joint effusion. IMPRESSION: 1. Nondisplaced and non comminuted transverse fracture of the patella. Electronically Signed   By: Amanda Jungling M.D.   On: 07/09/2023 16:48  I, Garlan Juniper, personally (independently) visualized and performed the interpretation of the images attached in this note.      Assessment and Plan: 83 y.o. female with left patella fracture.  Fortunately it is nondisplaced. Plan for immobilization with a knee immobilizer today and limited weightbearing.  She does have a walker at home that she can use.  Plan to refer to orthopedic surgery.  This could be treated conservatively but I think it is worth her time to have a conversation with the surgeon about her surgical options for this.  PDMP not reviewed this encounter. Orders Placed This Encounter   Procedures   Ambulatory referral to Orthopedic Surgery    Referral Priority:   Routine    Referral Type:   Surgical    Referral Reason:   Specialty Services Required    Requested Specialty:   Orthopedic Surgery    Number of Visits Requested:   1   No orders of the defined types were placed in this encounter.    Discussed warning signs or symptoms. Please see discharge instructions. Patient expresses understanding.   The above documentation has been reviewed and is accurate and complete Garlan Juniper, M.D.

## 2023-07-11 NOTE — Telephone Encounter (Signed)
 I am sorry.  It should take 1/2 tablets or the whole one?  Okay to discontinue citalopram .  Thanks

## 2023-07-11 NOTE — Telephone Encounter (Signed)
 Patient called to let Dr Alease Hunter know that she is scheduled with the Orthopedic office at [facility] on Monday.

## 2023-07-12 ENCOUNTER — Encounter: Payer: Self-pay | Admitting: Family Medicine

## 2023-07-12 DIAGNOSIS — K625 Hemorrhage of anus and rectum: Secondary | ICD-10-CM | POA: Insufficient documentation

## 2023-07-12 DIAGNOSIS — L2084 Intrinsic (allergic) eczema: Secondary | ICD-10-CM | POA: Insufficient documentation

## 2023-07-12 DIAGNOSIS — W19XXXA Unspecified fall, initial encounter: Secondary | ICD-10-CM | POA: Insufficient documentation

## 2023-07-12 DIAGNOSIS — M25562 Pain in left knee: Secondary | ICD-10-CM | POA: Insufficient documentation

## 2023-07-12 DIAGNOSIS — S82092A Other fracture of left patella, initial encounter for closed fracture: Secondary | ICD-10-CM | POA: Insufficient documentation

## 2023-07-12 NOTE — Patient Instructions (Signed)
 We are checking labs today, will be in contact with any results that require further attention  We are getting an xray today. We will be in contact with any abnormal results that require further attention.  I have sent a referral to sports medicine for you to follow-up there.  Follow-up with me for new or worsening symptoms.  Follow-up with Dr. Georgia Kipper as scheduled.

## 2023-07-14 ENCOUNTER — Telehealth: Payer: Self-pay | Admitting: Internal Medicine

## 2023-07-14 ENCOUNTER — Ambulatory Visit (HOSPITAL_BASED_OUTPATIENT_CLINIC_OR_DEPARTMENT_OTHER): Admitting: Student

## 2023-07-14 ENCOUNTER — Ambulatory Visit: Admitting: Family Medicine

## 2023-07-14 NOTE — Telephone Encounter (Unsigned)
 Copied from CRM [id number]. Topic: Clinical - Lab/Test Results >> Jul 11, 2023  4:20 PM [name] [name] wrote: Reason for CRM: Dr Zoila Hines on Wednesday for a fall. Patient bled from her bottom and xrays were done. Patient is calling about results. Patient wants another prescription for depression because the current medication was causing her to lose sleep. Callback number is [phone]

## 2023-07-15 ENCOUNTER — Ambulatory Visit (HOSPITAL_BASED_OUTPATIENT_CLINIC_OR_DEPARTMENT_OTHER): Admitting: Student

## 2023-07-15 ENCOUNTER — Encounter (HOSPITAL_BASED_OUTPATIENT_CLINIC_OR_DEPARTMENT_OTHER): Payer: Self-pay | Admitting: Student

## 2023-07-15 DIAGNOSIS — S82035A Nondisplaced transverse fracture of left patella, initial encounter for closed fracture: Secondary | ICD-10-CM | POA: Diagnosis not present

## 2023-07-15 NOTE — Telephone Encounter (Signed)
 Is she referring to citalopram  side effects?  If yes, please stop citalopram . Schedule office visit with me or with Trevor Fudge this week. Thank you

## 2023-07-15 NOTE — Progress Notes (Signed)
 Chief Complaint: Left patella fracture    Discussed the use of AI scribe software for clinical note transcription with the patient, who gave verbal consent to proceed.  History of Present Illness [name] [name] is an 83 year old female who presents with knee pain following a fall. She is accompanied by her husband, Siegfried Dress. She was referred by Dr. Alease Hunter for evaluation of her knee injury.  Approximately one week ago, she slipped while exiting her SUV, landing directly on her knee. She was wearing new shoes at the time, which contributed to the slip. She experiences knee pain rated 4/10 today at rest, increasing with movement or weight-bearing. Pain is localized over the kneecap. Initial significant swelling has improved, and no bruising is observed. She saw Dr. Alease Hunter with sports medicine on 5/30 and was given a knee immobilizer which she has used some, although prefers a knee sleeve that she has at home. She has taken Tylenol  Arthritis, 650 mg, only once for pain relief. She uses a walker for mobility.   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)   HTN (hypertension)    Hyperlipidemia    Hypothyroidism    Personal history of chemotherapy 2002   Personal history of radiation therapy 2002   Psoriasis    S/P thyroidectomy 07/2005   2 cm largest diameter (1 other tiny focus)/ i-131 rx 99 [facility] 08/2005   Thyroid  cancer (HCC)    Papillary Stage 1 - Dr Washington Hacker   Vitamin B12 deficiency 2009   Vitamin D  deficiency 2009   Past Surgical History:  Procedure Laterality Date   BREAST LUMPECTOMY     BREAST LUMPECTOMY WITH RADIOACTIVE SEED AND  SENTINEL LYMPH NODE BIOPSY Left 09/11/2016   Procedure: LEFT BREAST LUMPECTOMY WITH RADIOACTIVE SEED AND LEFT AXILLARY SENTINEL LYMPH NODE BIOPSY WITH BLUE DYE INJECTION;  Surgeon: Boyce Byes, MD;  Location: [facility];  Service: General;  Laterality: Left;   IR FLUORO GUIDE PORT INSERTION RIGHT  09/13/2016   IR REMOVAL TUN ACCESS W/ PORT W/O FL MOD SED  12/30/2016   IR US  GUIDE VASC ACCESS RIGHT  09/13/2016   MASTECTOMY Right    Right   PORTACATH PLACEMENT N/A 09/11/2016   Procedure: ATTEMPTED INSERTION PORT-A-CATH WITH ULTRA SOUND GUIDANCE;  Surgeon: Boyce Byes, MD;  Location: [facility];  Service: General;  Laterality: N/A;   REDUCTION MAMMAPLASTY Left 2005   THYROIDECTOMY  2007   Social History   Socioeconomic History   Marital status: Married    Spouse name: Not on file   Number of children: 2   Years of education: Not on file   Highest education level: Not on file  Occupational History   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  Tobacco Use   Smoking status: Never   Smokeless tobacco: Never  Vaping Use   Vaping status: Never Used  Substance and Sexual Activity   Alcohol use: No  Drug use: No   Sexual activity: Not Currently  Other Topics Concern   Not on file  Social History Narrative   GI - Dr Andriette Keeling   Depression - Dr Deborra Falter   GYN - Dr Arvie Birkenhead      Social Drivers of Health   Financial Resource Strain: Low Risk  (06/21/2022)   Overall Financial Resource Strain (CARDIA)    Difficulty of Paying Living Expenses: Not hard at all  Food Insecurity: No Food Insecurity (06/21/2022)   Hunger Vital Sign    Worried About Running Out of Food in the Last Year: Never true    Ran Out of Food in the Last Year: Never true  Transportation Needs: No Transportation Needs (06/21/2022)   PRAPARE - Administrator, Civil Service (Medical): No    Lack of Transportation (Non-Medical): No   Physical Activity: Insufficiently Active (06/21/2022)   Exercise Vital Sign    Days of Exercise per Week: 7 days    Minutes of Exercise per Session: 20 min  Stress: No Stress Concern Present (06/21/2022)   [employer] of Occupational Health - Occupational Stress Questionnaire    Feeling of Stress : Not at all  Social Connections: Socially Integrated (06/21/2022)   Social Connection and Isolation Panel [NHANES]    Frequency of Communication with Friends and Family: More than three times a week    Frequency of Social Gatherings with Friends and Family: More than three times a week    Attends Religious Services: More than 4 times per year    Active Member of [employer] or Organizations: Yes    Attends Engineer, structural: More than 4 times per year    Marital Status: Married   Family History  Problem Relation Age of Onset   Stroke Mother    Allergies Mother    Asthma Mother    Clotting disorder Mother    Heart disease Father 5       MI   Allergies Sister    Asthma Sister    Breast cancer Sister 66       Lymphoma 41; currently 62   Lymphoma Sister    Hyperparathyroidism Sister    Asthma Brother    Leukemia Brother        dx 92s   Allergies Daughter    Asthma Daughter    Allergies Sister    Breast cancer Sister 80       currently 29   Kidney cancer Paternal Aunt        kidney ca; deceased 54   Liver cancer Paternal Uncle        unk. primary ("liver")   Throat cancer Maternal Grandmother        deceased 79   Lung cancer Maternal Grandfather        deceased 16   Pancreatic cancer Paternal Grandfather        deceased 60   Cancer Paternal Aunt        "abdominal"; deceased 63   Allergies  Allergen Reactions   Pneumococcal Vaccines Other (See Comments)    Was really sick    Citalopram      Insomnia   Sulfa Antibiotics Other (See Comments)    Pt believes it was hives   Sulfacetamide Sodium-Sulfur     Sulfamethoxazole-Trimethoprim     Other reaction(s):  Unknown   Tape Itching, Dermatitis and Rash   Tegaderm Ag Mesh [Silver] Itching and Rash   Current Outpatient Medications  Medication Sig Dispense Refill  ALPRAZolam  (XANAX ) 1 MG tablet Take 1 tablet (1 mg total) by mouth 3 (three) times daily as needed for anxiety. 90 tablet 2   anastrozole  (ARIMIDEX ) 1 MG tablet TAKE 1 TABLET BY MOUTH EVERY DAY 90 tablet 1   cholecalciferol (VITAMIN D ) 1000 units tablet Take 2 tablets (2,000 Units total) by mouth daily. 100 tablet 3   clobetasol  (TEMOVATE ) 0.05 % external solution Apply 1 Application topically 2 (two) times daily. 50 mL 3   clopidogrel  (PLAVIX ) 75 MG tablet TAKE 1 TABLET (75 MG TOTAL) BY MOUTH DAILY FOR 21 DAYS. 90 tablet 3   guaiFENesin  (MUCINEX ) 600 MG 12 hr tablet Take 1 tablet (600 mg total) by mouth 2 (two) times daily. 60 tablet 1   levothyroxine  (SYNTHROID ) 112 MCG tablet TAKE 1 TABLET BY MOUTH EVERY DAY 90 tablet 3   liothyronine  (CYTOMEL ) 25 MCG tablet Take 0.5 tablets (12.5 mcg total) by mouth daily. 45 tablet 3   meclizine  (ANTIVERT ) 12.5 MG tablet TAKE 1 TABLET BY MOUTH 3 TIMES DAILY AS NEEDED FOR DIZZINESS. (Patient not taking: Reported on 07/09/2023) 60 tablet 1   ofloxacin  (OCUFLOX ) 0.3 % ophthalmic solution Place 1 drop into the left eye 4 (four) times daily.     pantoprazole  (PROTONIX ) 40 MG tablet Take 1 tablet (40 mg total) by mouth 2 (two) times daily. (Patient not taking: Reported on 07/09/2023) 180 tablet 3   prednisoLONE acetate (PRED FORTE) 1 % ophthalmic suspension PLEASE SEE ATTACHED FOR DETAILED DIRECTIONS     rosuvastatin  (CRESTOR ) 20 MG tablet TAKE 1 TABLET BY MOUTH EVERY DAY 90 tablet 1   triamcinolone  cream (KENALOG ) 0.1 % Apply 1 Application topically 2 (two) times daily. 30 g 0   vitamin B-12 (CYANOCOBALAMIN ) 1000 MCG tablet Take 1,000 mcg by mouth daily.     No current facility-administered medications for this visit.   Facility-Administered Medications Ordered in Other Visits  Medication Dose Route  Frequency Provider Last Rate Last Admin   denosumab  (PROLIA ) injection 60 mg  60 mg Subcutaneous Once Causey, Lindsey Cornetto, NP       No results found.  Review of Systems:   A ROS was performed including pertinent positives and negatives as documented in the HPI.  Physical Exam :   Constitutional: NAD and appears stated age Neurological: Alert and oriented Psych: Appropriate affect and cooperative There were no vitals taken for this visit.   Comprehensive Musculoskeletal Exam:    Ortho exam of the left knee demonstrates mild tenderness with palpation over the patella without any evidence of visible abnormality.  Mild soft tissue edema and minimal effusion present.  Extensor mechanism is intact with active range of motion from 0 to 90 degrees, limited by positioning in the wheelchair.  Distal neurosensory exam is intact.  Imaging:   Xray review from 07/09/2023 (left knee 2 views): Nondisplaced transverse patella fracture   I personally reviewed and interpreted the radiographs.      Assessment & Plan Nondisplaced fracture of the knee   The nondisplaced patellar fracture has a favorable prognosis, with conservative management expected to result in healing.  Initial treatment with knee immobilization will lessen any risk of further displacement in the early stages of healing. Advise using a knee immobilizer during weight-bearing for at least two weeks, allowing removal when resting or stationary. Permit gentle knee movement without resistance to preserve range of motion. Schedule a follow-up in two weeks for x-ray and reassessment. Recommend Tylenol  as needed for pain.  I personally saw and evaluated the patient, and participated in the management and treatment plan.  Sharrell Deck, PA-C Orthopedics

## 2023-07-16 NOTE — Telephone Encounter (Signed)
 Pt is not answering as we are trying to get clarification per provider "Is she referring to citalopram  side effects?  If yes, please stop citalopram . Schedule office visit with me or with Trevor Fudge this week. Thank you"

## 2023-07-16 NOTE — Telephone Encounter (Signed)
Being addressed in separate telephone note.

## 2023-07-21 ENCOUNTER — Ambulatory Visit (INDEPENDENT_AMBULATORY_CARE_PROVIDER_SITE_OTHER): Admitting: Internal Medicine

## 2023-07-21 ENCOUNTER — Encounter: Payer: Self-pay | Admitting: Internal Medicine

## 2023-07-21 VITALS — BP 118/78 | HR 74 | Temp 98.0°F | Ht 60.0 in | Wt 157.0 lb

## 2023-07-21 DIAGNOSIS — R739 Hyperglycemia, unspecified: Secondary | ICD-10-CM

## 2023-07-21 DIAGNOSIS — R413 Other amnesia: Secondary | ICD-10-CM | POA: Insufficient documentation

## 2023-07-21 DIAGNOSIS — S82092A Other fracture of left patella, initial encounter for closed fracture: Secondary | ICD-10-CM

## 2023-07-21 DIAGNOSIS — E538 Deficiency of other specified B group vitamins: Secondary | ICD-10-CM | POA: Diagnosis not present

## 2023-07-21 DIAGNOSIS — K625 Hemorrhage of anus and rectum: Secondary | ICD-10-CM

## 2023-07-21 DIAGNOSIS — R270 Ataxia, unspecified: Secondary | ICD-10-CM

## 2023-07-21 MED ORDER — DONEPEZIL HCL 5 MG PO TABS: 5.0000 mg | ORAL_TABLET | Freq: Every day | ORAL | 1 refills | Status: DC

## 2023-07-21 NOTE — Assessment & Plan Note (Signed)
 On B12

## 2023-07-21 NOTE — Assessment & Plan Note (Signed)
 Mild, chronic. On Lion's [place] mushroom Start on Aricept - pt's request

## 2023-07-21 NOTE — Assessment & Plan Note (Signed)
 Pt had rectal bleeding - no relapse. GI appt tomorrow w/Dr Rosaline Coma

## 2023-07-21 NOTE — Progress Notes (Signed)
 Subjective:  Patient ID: [name] [name], female    DOB: Feb 21, 1940  Age: 83 y.o. MRN: [id number]  CC: Medical Management of Chronic Issues (2 MNTH F/U, pt has concerns about left knee and concern about rash and dry skin patches on hands and arms)   HPI [name] [name] presents for L knee pain - pt fell 2 wks ago -" left patella fracture. Fortunately it is nondisplaced." Pt had rectal bleeding - no relapse. C/o memory loss - pt wants to take a Rx  Outpatient Medications Prior to Visit  Medication Sig Dispense Refill   ALPRAZolam  (XANAX ) 1 MG tablet Take 1 tablet (1 mg total) by mouth 3 (three) times daily as needed for anxiety. 90 tablet 2   anastrozole  (ARIMIDEX ) 1 MG tablet TAKE 1 TABLET BY MOUTH EVERY DAY 90 tablet 1   cholecalciferol (VITAMIN D ) 1000 units tablet Take 2 tablets (2,000 Units total) by mouth daily. 100 tablet 3   clobetasol  (TEMOVATE ) 0.05 % external solution Apply 1 Application topically 2 (two) times daily. 50 mL 3   clopidogrel  (PLAVIX ) 75 MG tablet TAKE 1 TABLET (75 MG TOTAL) BY MOUTH DAILY FOR 21 DAYS. 90 tablet 3   DULoxetine  (CYMBALTA ) 60 MG capsule TAKE ONE CAPSULE BY MOUTH EVERY DAY Oral; Duration: 30     guaiFENesin  (MUCINEX ) 600 MG 12 hr tablet Take 1 tablet (600 mg total) by mouth 2 (two) times daily. 60 tablet 1   levothyroxine  (SYNTHROID ) 112 MCG tablet TAKE 1 TABLET BY MOUTH EVERY DAY 90 tablet 3   liothyronine  (CYTOMEL ) 25 MCG tablet Take 0.5 tablets (12.5 mcg total) by mouth daily. 45 tablet 3   metoprolol  succinate (TOPROL -XL) 25 MG 24 hr tablet Take 12.5 mg by mouth at bedtime.     ofloxacin  (OCUFLOX ) 0.3 % ophthalmic solution Place 1 drop into the left eye 4 (four) times daily.     prednisoLONE acetate (PRED FORTE) 1 % ophthalmic suspension PLEASE SEE ATTACHED FOR DETAILED DIRECTIONS     rosuvastatin  (CRESTOR ) 20 MG tablet TAKE 1 TABLET BY MOUTH EVERY DAY 90 tablet 1   telmisartan  (MICARDIS ) 20 MG tablet Take 20 mg by mouth daily.     triamcinolone   cream (KENALOG ) 0.1 % Apply 1 Application topically 2 (two) times daily. 30 g 0   venlafaxine  XR (EFFEXOR -XR) 75 MG 24 hr capsule TAKE 1 CAPSULE (75 MG TOTAL) DAILY WITH BREAKFAST BY MOUTH. Oral; Duration: 30     vitamin B-12 (CYANOCOBALAMIN ) 1000 MCG tablet Take 1,000 mcg by mouth daily.     meclizine  (ANTIVERT ) 12.5 MG tablet TAKE 1 TABLET BY MOUTH 3 TIMES DAILY AS NEEDED FOR DIZZINESS. (Patient not taking: Reported on 07/21/2023) 60 tablet 1   pantoprazole  (PROTONIX ) 40 MG tablet Take 1 tablet (40 mg total) by mouth 2 (two) times daily. (Patient not taking: Reported on 05/22/2023) 180 tablet 3   Facility-Administered Medications Prior to Visit  Medication Dose Route Frequency Provider Last Rate Last Admin   denosumab  (PROLIA ) injection 60 mg  60 mg Subcutaneous Once Causey, Lindsey Cornetto, NP        ROS: Review of Systems  Constitutional:  Negative for activity change, appetite change, chills, fatigue and unexpected weight change.  HENT:  Negative for congestion, mouth sores and sinus pressure.   Eyes:  Negative for visual disturbance.  Respiratory:  Negative for cough and chest tightness.   Gastrointestinal:  Negative for abdominal pain and nausea.  Genitourinary:  Negative for difficulty urinating, frequency and vaginal pain.  Musculoskeletal:  Positive for arthralgias and gait problem. Negative for back pain.  Skin:  Negative for color change, pallor and rash.  Neurological:  Negative for dizziness, tremors, weakness, numbness and headaches.  Hematological:  Bruises/bleeds easily.  Psychiatric/Behavioral:  Positive for decreased concentration. Negative for confusion, sleep disturbance and suicidal ideas. The patient is nervous/anxious.     Objective:  BP 118/78   Pulse 74   Temp 98 F (36.7 C) (Oral)   Ht 5' (1.524 m)   Wt 157 lb (71.2 kg)   SpO2 98%   BMI 30.66 kg/m   BP Readings from Last 3 Encounters:  07/21/23 118/78  07/11/23 118/80  07/09/23 114/70    Wt Readings  from Last 3 Encounters:  07/21/23 157 lb (71.2 kg)  06/17/23 157 lb 6.4 oz (71.4 kg)  05/22/23 148 lb (67.1 kg)    Physical Exam Constitutional:      General: She is not in acute distress.    Appearance: She is well-developed.  HENT:     Head: Normocephalic.     Right Ear: External ear normal.     Left Ear: External ear normal.     Nose: Nose normal.  Eyes:     General:        Right eye: No discharge.        Left eye: No discharge.     Conjunctiva/sclera: Conjunctivae normal.     Pupils: Pupils are equal, round, and reactive to light.  Neck:     Thyroid : No thyromegaly.     Vascular: No JVD.     Trachea: No tracheal deviation.  Cardiovascular:     Rate and Rhythm: Normal rate and regular rhythm.     Heart sounds: Normal heart sounds.  Pulmonary:     Effort: No respiratory distress.     Breath sounds: No stridor. No wheezing.  Abdominal:     General: Bowel sounds are normal. There is no distension.     Palpations: Abdomen is soft. There is no mass.     Tenderness: There is no abdominal tenderness. There is no guarding or rebound.  Musculoskeletal:        General: Tenderness present.     Cervical back: Normal range of motion and neck supple. No rigidity.     Right lower leg: No edema.     Left lower leg: No edema.  Lymphadenopathy:     Cervical: No cervical adenopathy.  Skin:    Findings: No erythema or rash.  Neurological:     Mental Status: Mental status is at baseline.     Cranial Nerves: No cranial nerve deficit.     Motor: No abnormal muscle tone.     Coordination: Coordination normal.     Gait: Gait abnormal.     Deep Tendon Reflexes: Reflexes normal.  Psychiatric:        Behavior: Behavior normal.        Thought Content: Thought content normal.        Judgment: Judgment normal.   L knee is tender Not wearing a brace In a w/c  Lab Results  Component Value Date   WBC 9.9 07/09/2023   HGB 9.8 (L) 07/09/2023   HCT 31.6 (L) 07/09/2023   PLT 392.0  07/09/2023   GLUCOSE 190 (H) 07/09/2023   CHOL 179 03/20/2023   TRIG 326.0 (H) 03/20/2023   HDL 46.20 03/20/2023   LDLDIRECT 125.0 06/05/2015   LDLCALC 67 03/20/2023   ALT 15 07/09/2023   AST  27 07/09/2023   NA 136 07/09/2023   K 3.9 07/09/2023   CL 105 07/09/2023   CREATININE 1.47 (H) 07/09/2023   BUN 21 07/09/2023   CO2 23 07/09/2023   TSH 3.53 03/20/2023   INR 1.1 08/16/2021   HGBA1C 6.2 06/20/2022    US  Abdomen Complete Result Date: 06/27/2023 CLINICAL DATA:  Abd pain, bloating. EXAM: ABDOMEN ULTRASOUND COMPLETE COMPARISON:  March 07, 2022 FINDINGS: Gallbladder: No gallstones. No wall thickening or pericholecystic fluid. No sonographic Murphy's sign noted by sonographer. Common bile duct: 2 mm Liver: Increased echogenicity. No focal lesion identified. No intrahepatic biliary ductal dilation. Portal vein is patent on color Doppler imaging with normal direction of blood flow towards the liver. IVC: No abnormality visualized. Pancreas: Visualized portion unremarkable. Spleen: Size and appearance within normal limits. Right Kidney: Length: 9.7 cm. Cortical thinning with mildly increased echogenicity. Exophytic upper pole cyst measuring 3.3 cm. No hydronephrosis or nephrolithiasis. Left Kidney: Length: 10.5 cm. Cortical thinning with Increased echogenicity. Lower pole cyst measuring 6.8 cm. No hydronephrosis or nephrolithiasis. Abdominal aorta: No aneurysm visualized. Other findings: None. IMPRESSION: 1. No acute sonographic abnormality on this complete abdominal ultrasound. 2. Renal parenchymal changes consistent with chronic medical renal disease. 3. Hepatic steatosis. Electronically Signed   By: Rance Burrows M.D.   On: 06/27/2023 11:53    Assessment & Plan:   Problem List Items Addressed This Visit     B12 deficiency   On B12      Hyperglycemia   Check CMET, A1c today Cutback on sweets, exercise!      Relevant Orders   Comprehensive metabolic panel with GFR   Hemoglobin  A1c   Ataxia   Chronic S/p a recent fall      Relevant Orders   CBC with Differential/Platelet   Rectal bleeding    Pt had rectal bleeding - no relapse. GI appt tomorrow w/Dr Rosaline Coma      Relevant Orders   CBC with Differential/Platelet   Iron, TIBC and Ferritin Panel   Closed patellar sleeve fracture of left knee - Primary   New L knee pain - pt fell 2 wks ago -" left patella fracture. Fortunately it is nondisplaced." F/u w/Dr Alease Hunter      Memory loss   Mild, chronic. On [employer] [place] mushroom Start on Aricept - pt's request         Meds ordered this encounter  Medications   donepezil (ARICEPT) 5 MG tablet    Sig: Take 1 tablet (5 mg total) by mouth at bedtime.    Dispense:  90 tablet    Refill:  1      Follow-up: Return in about 6 weeks (around 09/01/2023) for a follow-up visit.  Anitra Barn, MD

## 2023-07-21 NOTE — Assessment & Plan Note (Signed)
 Chronic S/p a recent fall

## 2023-07-21 NOTE — Assessment & Plan Note (Signed)
 Check CMET, A1c today Cutback on sweets, exercise!

## 2023-07-21 NOTE — Assessment & Plan Note (Signed)
 New L knee pain - pt fell 2 wks ago -" left patella fracture. Fortunately it is nondisplaced." F/u w/Dr Alease Hunter

## 2023-07-22 ENCOUNTER — Telehealth: Payer: Self-pay | Admitting: Internal Medicine

## 2023-07-22 ENCOUNTER — Ambulatory Visit: Admitting: Internal Medicine

## 2023-07-22 NOTE — Telephone Encounter (Signed)
 Copied from CRM [id number]. Topic: Clinical - Lab/Test Results >> Jul 22, 2023  9:10 AM Marlan Silva wrote: Reason for CRM: Patient called into go over her imaging results wit the nurse. She said she got the results earlier but she was half asleep and did not fully understand. Patient can be reached at [phone].

## 2023-07-24 ENCOUNTER — Other Ambulatory Visit: Payer: Self-pay | Admitting: Family Medicine

## 2023-07-24 ENCOUNTER — Ambulatory Visit: Payer: Self-pay

## 2023-07-24 ENCOUNTER — Telehealth: Payer: Self-pay | Admitting: Gastroenterology

## 2023-07-24 NOTE — Telephone Encounter (Signed)
 Patient called and states she appreciates Dr. Alease Hunter seeing her and also that the pressure she puts on her left leg gets bad if she is not holding on to something. She says it is better than it was the day she was here. She wants to know if he can give her exercises to help it. She has been using ice constantly. She says she tries to exercise her foot and lift it up when she is laying down but she can move it from right to left and she can move her foot up and down and it doesn't hurt but laying flat and lifting her leg is when it really hurts. She wants someone to call her to let her know if she should be more aggressive with her movements or just move slightly. She does not want to be too aggressive and mess up her knee cap. She would like for someone to call her. Please advise.

## 2023-07-24 NOTE — Telephone Encounter (Signed)
 Patient called and stated that she was experiencing rectal bleeding but since then she has not bleed anymore, but is worried because she is currently having pain in that area. Patient stated that she is also wishing to reschedule her Manometry. Patient is requesting a call back. Please advise.

## 2023-07-24 NOTE — Telephone Encounter (Signed)
 Spoke to pt, who was very confused. She thought she already saw the surgeon, but her visit is scheduled for next week. Re-confirmed x 3 Dr. Jola Nash plan to wear the knee immobilizer and limit weight bear as much as possible. Pt verbalized understanding.

## 2023-07-24 NOTE — Telephone Encounter (Signed)
 Copied from CRM [id number]. Topic: Clinical - Lab/Test Results >> Jul 24, 2023  4:35 PM [name] [name] wrote: Reason for CRM: Patient calling to ask if someone could go over her prior lab results for her blood sugar  and compare to the most recent lab results   Reason for Disposition  Caller requesting routine or non-urgent lab result  Answer Assessment - Initial Assessment Questions 1. REASON FOR CALL or QUESTION: What is your reason for calling today? or How can I best help you? or What question do you have that I can help answer?     Lab results  2. CALLER: Document the source of call. (e.g., laboratory, patient).     Patient    Lab results discussed with the patient. Patient has no further questions at this time.  Protocols used: PCP Call - No Triage-A-AH

## 2023-07-24 NOTE — Telephone Encounter (Signed)
 Contacted patient to discuss her concerns. Patient states that she had an appointment yesterday with Dr Rosaline Coma but was unable to make it in due to recent fall causing patellar fracture and inability to be get around without a wheelchair. Patient requests rescheduling appointment. She is actually already rescheduled to see Deanna May, FNP on 08/13/23. She was offered a sooner appointment but declined all dates as they are morning appointments and she says she cannot get going that early. Therefore, patient will keep scheduled 08/13/23 appointment with [facility].   Patient has difficulty expressing her concerns and fully recalling recent events throughout our call, but states that she has been having some dull right lower quadrant abdominal pain intermittently and tenderness to touch. States she had an episode of large amount brbpr following self administration of an enema for perceived constipation (patient had not had a bowel movement for 2 days prior to enema). She states she had intense abdominal pain following the enema and bleeding but since has had non recurrence of bleeding. She denies any melena, dizziness, SOB, chest pain that would indicate acute GI bleed.   Patient is advised that should she develop melena, severe abdominal pain, SOB, dizziness, chest pain, fever between now and her office visit, she should present to the emergency room for sooner evaluation. She verbalizes understanding.

## 2023-07-29 ENCOUNTER — Ambulatory Visit (HOSPITAL_BASED_OUTPATIENT_CLINIC_OR_DEPARTMENT_OTHER): Admitting: Student

## 2023-07-29 ENCOUNTER — Ambulatory Visit (HOSPITAL_BASED_OUTPATIENT_CLINIC_OR_DEPARTMENT_OTHER)

## 2023-07-29 ENCOUNTER — Encounter (HOSPITAL_BASED_OUTPATIENT_CLINIC_OR_DEPARTMENT_OTHER): Payer: Self-pay | Admitting: Student

## 2023-07-29 DIAGNOSIS — S82035A Nondisplaced transverse fracture of left patella, initial encounter for closed fracture: Secondary | ICD-10-CM | POA: Diagnosis not present

## 2023-07-29 DIAGNOSIS — M25462 Effusion, left knee: Secondary | ICD-10-CM | POA: Diagnosis not present

## 2023-07-29 NOTE — Progress Notes (Signed)
 Chief Complaint: Left patella fracture    History of Present Illness  07/29/23: [name] [name] presents today for follow-up of a left patella fracture.  Today she reports no significant pain at the knee and her strength with extension has improved.  She is using a small patella brace that she bought over-the-counter and is able to put an ice pack inside of it which she uses frequently.  Denies much use of the immobilizer due to discomfort and bulkiness.  Reports that she is not putting much weight through her foot other than when transferring from the bed to wheelchair or onto the toilet.   07/15/23: [name] [name] is an 83 year old female who presents with knee pain following a fall. She is accompanied by her husband, Siegfried Dress. She was referred by Dr. Alease Hunter for evaluation of her knee injury. Approximately one week ago, she slipped while exiting her SUV, landing directly on her knee. She was wearing new shoes at the time, which contributed to the slip. She experiences knee pain rated 4/10 today at rest, increasing with movement or weight-bearing. Pain is localized over the kneecap. Initial significant swelling has improved, and no bruising is observed. She saw Dr. Alease Hunter with sports medicine on 5/30 and was given a knee immobilizer which she has used some, although prefers a knee sleeve that she has at home. She has taken Tylenol  Arthritis, 650 mg, only once for pain relief. She uses a walker for mobility.   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)   HTN (hypertension)    Hyperlipidemia    Hypothyroidism    Personal history of chemotherapy 2002    Personal history of radiation therapy 2002   Psoriasis    S/P thyroidectomy 07/2005   2 cm largest diameter (1 other tiny focus)/ i-131 rx 99 [facility] 08/2005   Thyroid  cancer (HCC)    Papillary Stage 1 - Dr Washington Hacker   Vitamin B12 deficiency 2009   Vitamin D  deficiency 2009   Past Surgical History:  Procedure Laterality Date   BREAST LUMPECTOMY     BREAST LUMPECTOMY WITH RADIOACTIVE SEED AND SENTINEL LYMPH NODE BIOPSY Left 09/11/2016   Procedure: LEFT BREAST LUMPECTOMY WITH RADIOACTIVE SEED AND LEFT AXILLARY SENTINEL LYMPH NODE BIOPSY WITH BLUE DYE INJECTION;  Surgeon: Boyce Byes, MD;  Location: [facility];  Service: General;  Laterality: Left;   IR FLUORO GUIDE PORT INSERTION RIGHT  09/13/2016   IR REMOVAL TUN ACCESS W/ PORT W/O FL MOD SED  12/30/2016   IR US  GUIDE VASC ACCESS RIGHT  09/13/2016   MASTECTOMY Right    Right   PORTACATH PLACEMENT N/A 09/11/2016   Procedure: ATTEMPTED INSERTION PORT-A-CATH WITH ULTRA SOUND GUIDANCE;  Surgeon: Boyce Byes, MD;  Location: [facility];  Service: General;  Laterality: N/A;   REDUCTION MAMMAPLASTY Left 2005   THYROIDECTOMY  2007   Social History   Socioeconomic History   Marital status: Married    Spouse name: Not on file  Number of children: 2   Years of education: Not on file   Highest education level: Not on file  Occupational History   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  Other Topics Concern   Not on file  Social History Narrative   GI - Dr Andriette Keeling   Depression - Dr Deborra Falter   GYN - Dr Arvie Birkenhead      Social Drivers of Health   Financial Resource Strain: Low Risk  (06/21/2022)   Overall Financial Resource Strain (CARDIA)    Difficulty of Paying  Living Expenses: Not hard at all  Food Insecurity: No Food Insecurity (06/21/2022)   Hunger Vital Sign    Worried About Running Out of Food in the Last Year: Never true    Ran Out of Food in the Last Year: Never true  Transportation Needs: No Transportation Needs (06/21/2022)   PRAPARE - Administrator, Civil Service (Medical): No    Lack of Transportation (Non-Medical): No  Physical Activity: Insufficiently Active (06/21/2022)   Exercise Vital Sign    Days of Exercise per Week: 7 days    Minutes of Exercise per Session: 20 min  Stress: No Stress Concern Present (06/21/2022)   [employer] of Occupational Health - Occupational Stress Questionnaire    Feeling of Stress : Not at all  Social Connections: Socially Integrated (06/21/2022)   Social Connection and Isolation Panel    Frequency of Communication with Friends and Family: More than three times a week    Frequency of Social Gatherings with Friends and Family: More than three times a week    Attends Religious Services: More than 4 times per year    Active Member of [employer] or Organizations: Yes    Attends Engineer, structural: More than 4 times per year    Marital Status: Married   Family History  Problem Relation Age of Onset   Stroke Mother    Allergies Mother    Asthma Mother    Clotting disorder Mother    Heart disease Father 42       MI   Allergies Sister    Asthma Sister    Breast cancer Sister 20       Lymphoma 36; currently 67   Lymphoma Sister    Hyperparathyroidism Sister    Asthma Brother    Leukemia Brother        dx 37s   Allergies Daughter    Asthma Daughter    Allergies Sister    Breast cancer Sister 89       currently 75   Kidney cancer Paternal Aunt        kidney ca; deceased 35   Liver cancer Paternal Uncle        unk. primary (liver)   Throat cancer Maternal Grandmother        deceased 47   Lung cancer Maternal Grandfather        deceased 80   Pancreatic cancer Paternal  Grandfather        deceased 30   Cancer Paternal Aunt        abdominal; deceased 33   Allergies  Allergen Reactions   Pneumococcal Vaccines Other (  See Comments)    Was really sick    Citalopram      Insomnia   Sulfa Antibiotics Other (See Comments)    Pt believes it was hives   Sulfacetamide Sodium-Sulfur     Sulfamethoxazole-Trimethoprim     Other reaction(s): Unknown   Tape Itching, Dermatitis and Rash   Tegaderm Ag Mesh [Silver] Itching and Rash   Current Outpatient Medications  Medication Sig Dispense Refill   ALPRAZolam  (XANAX ) 1 MG tablet Take 1 tablet (1 mg total) by mouth 3 (three) times daily as needed for anxiety. 90 tablet 2   anastrozole  (ARIMIDEX ) 1 MG tablet TAKE 1 TABLET BY MOUTH EVERY DAY 90 tablet 1   cholecalciferol (VITAMIN D ) 1000 units tablet Take 2 tablets (2,000 Units total) by mouth daily. 100 tablet 3   clobetasol  (TEMOVATE ) 0.05 % external solution Apply 1 Application topically 2 (two) times daily. 50 mL 3   clopidogrel  (PLAVIX ) 75 MG tablet TAKE 1 TABLET (75 MG TOTAL) BY MOUTH DAILY FOR 21 DAYS. 90 tablet 3   donepezil  (ARICEPT ) 5 MG tablet Take 1 tablet (5 mg total) by mouth at bedtime. 90 tablet 1   DULoxetine  (CYMBALTA ) 60 MG capsule TAKE ONE CAPSULE BY MOUTH EVERY DAY Oral; Duration: 30     guaiFENesin  (MUCINEX ) 600 MG 12 hr tablet Take 1 tablet (600 mg total) by mouth 2 (two) times daily. 60 tablet 1   levothyroxine  (SYNTHROID ) 112 MCG tablet TAKE 1 TABLET BY MOUTH EVERY DAY 90 tablet 3   liothyronine  (CYTOMEL ) 25 MCG tablet Take 0.5 tablets (12.5 mcg total) by mouth daily. 45 tablet 3   meclizine  (ANTIVERT ) 12.5 MG tablet TAKE 1 TABLET BY MOUTH 3 TIMES DAILY AS NEEDED FOR DIZZINESS. (Patient not taking: Reported on 07/21/2023) 60 tablet 1   metoprolol  succinate (TOPROL -XL) 25 MG 24 hr tablet Take 12.5 mg by mouth at bedtime.     ofloxacin  (OCUFLOX ) 0.3 % ophthalmic solution Place 1 drop into the left eye 4 (four) times daily.     pantoprazole   (PROTONIX ) 40 MG tablet Take 1 tablet (40 mg total) by mouth 2 (two) times daily. (Patient not taking: Reported on 05/22/2023) 180 tablet 3   prednisoLONE acetate (PRED FORTE) 1 % ophthalmic suspension PLEASE SEE ATTACHED FOR DETAILED DIRECTIONS     rosuvastatin  (CRESTOR ) 20 MG tablet TAKE 1 TABLET BY MOUTH EVERY DAY 90 tablet 1   telmisartan  (MICARDIS ) 20 MG tablet Take 20 mg by mouth daily.     triamcinolone  cream (KENALOG ) 0.1 % Apply 1 Application topically 2 (two) times daily. 30 g 0   venlafaxine  XR (EFFEXOR -XR) 75 MG 24 hr capsule TAKE 1 CAPSULE (75 MG TOTAL) DAILY WITH BREAKFAST BY MOUTH. Oral; Duration: 30     vitamin B-12 (CYANOCOBALAMIN ) 1000 MCG tablet Take 1,000 mcg by mouth daily.     No current facility-administered medications for this visit.   Facility-Administered Medications Ordered in Other Visits  Medication Dose Route Frequency Provider Last Rate Last Admin   denosumab  (PROLIA ) injection 60 mg  60 mg Subcutaneous Once Causey, Lindsey Cornetto, NP       No results found.  Review of Systems:   A ROS was performed including pertinent positives and negatives as documented in the HPI.  Physical Exam :   Constitutional: NAD and appears stated age Neurological: Alert and oriented Psych: Appropriate affect and cooperative There were no vitals taken for this visit.   Comprehensive Musculoskeletal Exam:    Exam of the left knee demonstrates no tenderness  over the patella or bilateral joint lines.  No significant swelling or notable ecchymosis.  Patient is able to perform active range of motion from 0 to 100 degrees.  Patient is nonweightbearing in clinic with use of a wheelchair.  Imaging:   Xray (left knee 4 views): Nondisplaced transverse patella fracture with signs of early callus formation   I personally reviewed and interpreted the radiographs.      Assessment & Plan Nondisplaced left patella fracture Patient is approximately 3 weeks status post nondisplaced  transverse left patella fracture.  Overall she reports doing well and is experiencing little pain within the knee.  She has been utilizing a small patellar stabilizing brace as opposed to the knee immobilizer due to comfort.  While I do not believe this is doing much from an immobilization standpoint, she is fairly sedentary and spends little time on her feet.  I have recommended that if she does get up to try to be more active, I would recommend weightbearing as tolerated in the immobilizer until next follow-up.  Fracture does demonstrate early healing so I would like to repeat x-ray in another 3 weeks to ensure fracture continues to heal and stabilizes.  Can continue with active range of motion without resistance and encouraged refraining from flexion past 90 degrees.       I personally saw and evaluated the patient, and participated in the management and treatment plan.  Sharrell Deck, PA-C Orthopedics

## 2023-07-31 ENCOUNTER — Ambulatory Visit: Payer: Self-pay

## 2023-07-31 NOTE — Telephone Encounter (Signed)
 Copied from CRM [id number]. Topic: Clinical - Red Word Triage >> Jul 31, 2023 12:42 PM Leah C wrote: Red Word that prompted transfer to Nurse Triage: Rash/break on hands, arms, back of neck , chest tiny bumps all over- skin is dry, burning, itching. Tried multiple over the counter products.

## 2023-07-31 NOTE — Telephone Encounter (Signed)
 Copied from CRM [id number]. Topic: Clinical - Red Word Triage >> Jul 31, 2023 12:42 PM Leah C wrote: Red Word that prompted transfer to Nurse Triage: Rash/break on hands, arms, back of neck , chest tiny bumps all over- skin is dry, burning, itching. Tried multiple over the counter products.    Reason for Disposition  Mild widespread rash  (Exception: Heat rash lasting 3 days or less.)  Answer Assessment - Initial Assessment Questions Patient declined an appointment and would like a referral to a dermatologist and would like a call back with a response as soon as able. She would also like recommendations of what she can do for her symptoms. Please advise.     1. APPEARANCE of RASH: Describe the rash. (e.g., spots, blisters, raised areas, skin peeling, scaly)     Scaly looking  2. SIZE: How big are the spots? (e.g., tip of pen, eraser, coin; inches, centimeters)     Tiny 3. LOCATION: Where is the rash located?     Hand, arms, back of neck, chest  4. COLOR: What color is the rash? (Note: It is difficult to assess rash color in people with darker-colored skin. When this situation occurs, simply ask the caller to describe what they see.)     Becomes red after washing hands or taking a shower  5. ONSET: When did the rash begin?     1 week 6. FEVER: Do you have a fever? If Yes, ask: What is your temperature, how was it measured, and when did it start?     No 7. ITCHING: Does the rash itch? If Yes, ask: How bad is the itch? (Scale 1-10; or mild, moderate, severe)     Yes 8. CAUSE: What do you think is causing the rash?     Unsure  9. MEDICINE FACTORS: Have you started any new medicines within the last 2 weeks? (e.g., antibiotics)      None before symptoms began  10. OTHER SYMPTOMS: Do you have any other symptoms? (e.g., dizziness, headache, sore throat, joint pain)       Cough but not new with rash  Protocols used: Rash or Redness - Widespread-A-AH    FYI Only or  Action Required?: Action required by provider: referral request.  Dermatologist   Patient was last seen in primary care on 07/21/2023 by Plotnikov, Oakley Bellman, MD. Called Nurse Triage reporting Rash. Symptoms began 2 weeks ago. Interventions attempted: Other: creams. Symptoms are: gradually worsening.  Triage Disposition: See PCP When Office is Open (Within 3 Days), Call PCP Now  Patient/caregiver understands and will follow disposition?: No, wishes to speak with PCP

## 2023-08-01 ENCOUNTER — Ambulatory Visit: Payer: Self-pay

## 2023-08-01 NOTE — Telephone Encounter (Signed)
 Please schedule office visit today with any available provider. Stop Aricept  (tramadol )-it is probably the newest medication. Use Benadryl 25 mg 4 times a day as needed for itching until she is seen by a provider. Thanks

## 2023-08-01 NOTE — Telephone Encounter (Signed)
  FYI Only or Action Required?: FYI only for provider.  Patient was last seen in primary care on 07/21/2023 by Plotnikov, [name] Bellman, MD. Called Nurse Triage reporting Rash. Symptoms began several weeks ago. Interventions attempted: OTC medications: creams and lotions. Symptoms are: gradually worsening.  Triage Disposition: See Physician Within 24 Hours  Patient/caregiver understands and will follow disposition?: Yes  Copied from CRM [id number]. Topic: Clinical - Red Word Triage >> Aug 01, 2023  4:17 PM [name] wrote: [name] that prompted transfer to Nurse Triage: Patient called in regarding being very itchy all over with rashes and bumps, has been waiting for someone to give her a callback to schedule an appointment Reason for Disposition  SEVERE itching (i.e., interferes with sleep, normal activities or school)  Answer Assessment - Initial Assessment Questions 1. APPEARANCE of RASH: Describe the rash. (e.g., spots, blisters, raised areas, skin peeling, scaly)     Dry scaly 2. SIZE: How big are the spots? (e.g., tip of pen, eraser, coin; inches, centimeters)     Small bumps 3. LOCATION: Where is the rash located?     Bilateral arms, legs, chest, back, over her entire body 4. COLOR: What color is the rash? (Note: It is difficult to assess rash color in people with darker-colored skin. When this situation occurs, simply ask the caller to describe what they see.)     flesh 5. ONSET: When did the rash begin?     ongoing 6. FEVER: Do you have a fever? If Yes, ask: What is your temperature, how was it measured, and when did it start?     denies 7. ITCHING: Does the rash itch? If Yes, ask: How bad is the itch? (Scale 1-10; or mild, moderate, severe)     severe 8. CAUSE: What do you think is causing the rash?     unsure 9. MEDICINE FACTORS: Have you started any new medicines within the last 2 weeks? (e.g., antibiotics)      Yes-but states she only has taken two doses and  rash was present before starting new medication.  10. OTHER SYMPTOMS: Do you have any other symptoms? (e.g., dizziness, headache, sore throat, joint pain)       Denies  Additional info: Patient called earlier and has not received a call back. Note in chart from Dr. Georgia Kipper read to patient, she understood but states she has tried Benadryl and it does not work. No appointments available in office at time of call, scheduled next available on 08/04/23, patient states she is going to head to urgent care for treatment today but would also like office visit on Monday for reassessment and discuss referral to dermatology. She states she has a history of skin cancer and this is looking the same.  Protocols used: Rash or Redness - [facility]

## 2023-08-04 ENCOUNTER — Ambulatory Visit (INDEPENDENT_AMBULATORY_CARE_PROVIDER_SITE_OTHER): Admitting: Family Medicine

## 2023-08-04 ENCOUNTER — Encounter: Payer: Self-pay | Admitting: Family Medicine

## 2023-08-04 VITALS — BP 122/72 | HR 90 | Temp 97.8°F | Resp 16 | Ht 60.0 in | Wt 152.0 lb

## 2023-08-04 DIAGNOSIS — L309 Dermatitis, unspecified: Secondary | ICD-10-CM

## 2023-08-04 NOTE — Progress Notes (Signed)
 Assessment & Plan:  1. Eczema of both hands (Primary) Encouraged to use triamcinolone  cream that she has at home.  Education provided on atopic dermatitis.  Recommended wearing gloves if she is going to be having her hands in water for prolonged periods of time such as washing dishes.  Also discussed cloth gloves overnight to cover steroid cream and Aquaphor that she may apply. - Ambulatory referral to Dermatology   Follow up plan: Return if symptoms worsen or fail to improve.  Niki Rung, MSN, APRN, FNP-C  Subjective:  HPI: [name] [name] is a 83 y.o. female presenting on 08/04/2023 for Rash (Itchy rash on arms, back and neck x 3 weeks)  Patient reports an itchy rash on her arms, upper back, and neck x 3 weeks.  It worsens after washing her hands or taking a shower.  She does awake during the night itching.  She has tried applying [employer] for eczema as well as for other different lotions which are not effective.   ROS: Negative unless specifically indicated above in HPI.   Relevant past medical history reviewed and updated as indicated.   Allergies and medications reviewed and updated.   Current Outpatient Medications:    ALPRAZolam  (XANAX ) 1 MG tablet, Take 1 tablet (1 mg total) by mouth 3 (three) times daily as needed for anxiety., Disp: 90 tablet, Rfl: 2   anastrozole  (ARIMIDEX ) 1 MG tablet, TAKE 1 TABLET BY MOUTH EVERY DAY, Disp: 90 tablet, Rfl: 1   clopidogrel  (PLAVIX ) 75 MG tablet, TAKE 1 TABLET (75 MG TOTAL) BY MOUTH DAILY FOR 21 DAYS., Disp: 90 tablet, Rfl: 3   levothyroxine  (SYNTHROID ) 112 MCG tablet, TAKE 1 TABLET BY MOUTH EVERY DAY, Disp: 90 tablet, Rfl: 3   rosuvastatin  (CRESTOR ) 20 MG tablet, TAKE 1 TABLET BY MOUTH EVERY DAY, Disp: 90 tablet, Rfl: 1   vitamin B-12 (CYANOCOBALAMIN ) 1000 MCG tablet, Take 1,000 mcg by mouth daily., Disp: , Rfl:    cholecalciferol (VITAMIN D ) 1000 units tablet, Take 2 tablets (2,000 Units total) by mouth daily. (Patient not taking:  Reported on 08/04/2023), Disp: 100 tablet, Rfl: 3   clobetasol  (TEMOVATE ) 0.05 % external solution, Apply 1 Application topically 2 (two) times daily. (Patient not taking: Reported on 08/04/2023), Disp: 50 mL, Rfl: 3   donepezil  (ARICEPT ) 5 MG tablet, Take 1 tablet (5 mg total) by mouth at bedtime. (Patient not taking: Reported on 08/04/2023), Disp: 90 tablet, Rfl: 1   DULoxetine  (CYMBALTA ) 60 MG capsule, TAKE ONE CAPSULE BY MOUTH EVERY DAY Oral; Duration: 30 (Patient not taking: Reported on 08/04/2023), Disp: , Rfl:    guaiFENesin  (MUCINEX ) 600 MG 12 hr tablet, Take 1 tablet (600 mg total) by mouth 2 (two) times daily. (Patient not taking: Reported on 08/04/2023), Disp: 60 tablet, Rfl: 1   liothyronine  (CYTOMEL ) 25 MCG tablet, Take 0.5 tablets (12.5 mcg total) by mouth daily. (Patient not taking: Reported on 08/04/2023), Disp: 45 tablet, Rfl: 3   meclizine  (ANTIVERT ) 12.5 MG tablet, TAKE 1 TABLET BY MOUTH 3 TIMES DAILY AS NEEDED FOR DIZZINESS. (Patient not taking: Reported on 08/04/2023), Disp: 60 tablet, Rfl: 1   metoprolol  succinate (TOPROL -XL) 25 MG 24 hr tablet, Take 12.5 mg by mouth at bedtime. (Patient not taking: Reported on 08/04/2023), Disp: , Rfl:    ofloxacin  (OCUFLOX ) 0.3 % ophthalmic solution, Place 1 drop into the left eye 4 (four) times daily. (Patient not taking: Reported on 08/04/2023), Disp: , Rfl:    pantoprazole  (PROTONIX ) 40 MG tablet, Take 1 tablet (  40 mg total) by mouth 2 (two) times daily. (Patient not taking: Reported on 08/04/2023), Disp: 180 tablet, Rfl: 3   prednisoLONE acetate (PRED FORTE) 1 % ophthalmic suspension, PLEASE SEE ATTACHED FOR DETAILED DIRECTIONS (Patient not taking: Reported on 08/04/2023), Disp: , Rfl:    telmisartan  (MICARDIS ) 20 MG tablet, Take 20 mg by mouth daily. (Patient not taking: Reported on 08/04/2023), Disp: , Rfl:    triamcinolone  cream (KENALOG ) 0.1 %, Apply 1 Application topically 2 (two) times daily. (Patient not taking: Reported on 08/04/2023), Disp: 30 g,  Rfl: 0   venlafaxine  XR (EFFEXOR -XR) 75 MG 24 hr capsule, TAKE 1 CAPSULE (75 MG TOTAL) DAILY WITH BREAKFAST BY MOUTH. Oral; Duration: 30 (Patient not taking: Reported on 08/04/2023), Disp: , Rfl:  No current facility-administered medications for this visit.  Facility-Administered Medications Ordered in Other Visits:    denosumab  (PROLIA ) injection 60 mg, 60 mg, Subcutaneous, Once, Causey, Morna Pickle, NP  Allergies  Allergen Reactions   Pneumococcal Vaccines Other (See Comments)    Was really sick    Citalopram      Insomnia   Sulfa Antibiotics Other (See Comments)    Pt believes it was hives   Sulfacetamide Sodium-Sulfur     Sulfamethoxazole-Trimethoprim     Other reaction(s): Unknown   Tape Itching, Dermatitis and Rash   Tegaderm Ag Mesh [Silver] Itching and Rash    Objective:   BP 122/72   Pulse 90   Temp 97.8 F (36.6 C)   Resp 16   Ht 5' (1.524 m)   Wt 152 lb (68.9 kg)   SpO2 96%   BMI 29.69 kg/m    Physical Exam Vitals reviewed.  Constitutional:      General: She is not in acute distress.    Appearance: Normal appearance. She is not ill-appearing, toxic-appearing or diaphoretic.  HENT:     Head: Normocephalic and atraumatic.   Eyes:     General: No scleral icterus.       Right eye: No discharge.        Left eye: No discharge.     Conjunctiva/sclera: Conjunctivae normal.    Cardiovascular:     Rate and Rhythm: Normal rate.  Pulmonary:     Effort: Pulmonary effort is normal. No respiratory distress.   Musculoskeletal:        General: Normal range of motion.     Cervical back: Normal range of motion.   Skin:    General: Skin is warm and dry.     Capillary Refill: Capillary refill takes less than 2 seconds.     Findings: Rash (scaly overlying mild erythema to both arms, upper back, and neck) present.   Neurological:     General: No focal deficit present.     Mental Status: She is alert and oriented to person, place, and time. Mental status is at  baseline.   Psychiatric:        Mood and Affect: Mood normal.        Behavior: Behavior normal.        Thought Content: Thought content normal.        Judgment: Judgment normal.

## 2023-08-04 NOTE — Patient Instructions (Signed)
 Use triamcinolone  cream for itching.

## 2023-08-05 NOTE — Addendum Note (Signed)
 Addended by: MARYANN REBA HERO on: 08/05/2023 01:44 PM   Modules accepted: Orders

## 2023-08-05 NOTE — Telephone Encounter (Signed)
 Copied from CRM [id number]. Topic: Clinical - Medication Refill >> Aug 05, 2023  1:25 PM Sharnea C wrote: Medication: ALPRAZolam  (XANAX ) 1 MG tablet  Has the patient contacted their pharmacy? Yes (Agent: If no, request that the patient contact the pharmacy for the refill. If patient does not wish to contact the pharmacy document the reason why and proceed with request.) (Agent: If yes, when and what did the pharmacy advise?)  This is the patient's preferred pharmacy:  [id number] [address], [place] - [address] [place] [address] Phone: [phone] Fax: [phone]  Is this the correct pharmacy for this prescription? Yes If no, delete pharmacy and type the correct one.   Has the prescription been filled recently? No  Is the patient out of the medication? No  Has the patient been seen for an appointment in the last year OR does the patient have an upcoming appointment? Yes  Can we respond through MyChart? No  Agent: Please be advised that Rx refills may take up to 3 business days. We ask that you follow-up with your pharmacy.

## 2023-08-06 MED ORDER — ALPRAZOLAM 1 MG PO TABS: 1.0000 mg | ORAL_TABLET | Freq: Three times a day (TID) | ORAL | 2 refills | Status: DC | PRN

## 2023-08-06 NOTE — Telephone Encounter (Signed)
 Done. Thx.

## 2023-08-07 ENCOUNTER — Telehealth: Payer: Self-pay | Admitting: Internal Medicine

## 2023-08-07 ENCOUNTER — Ambulatory Visit: Payer: Self-pay

## 2023-08-07 NOTE — Telephone Encounter (Signed)
 Copied from CRM [id number]. Topic: Referral - Question >> Aug 07, 2023  8:10 AM Robinson H wrote: Reason for CRM: Patient calling checking status of referral to Dermatology, agent advised patient referral is pending insurance approval, patient wants to know how long it takes because she wants to get issues addressed as soon as possible she'll even pay out of pocket if she has to. Please reach out.  Tomasina [phone]

## 2023-08-07 NOTE — Telephone Encounter (Signed)
 FYI Only or Action Required?: FYI only for provider.  Patient was last seen in primary care on 08/04/2023 by [name] Niki FALCON, FNP. Called Nurse Triage reporting Rash. Symptoms began a week ago. Interventions attempted: OTC medications: Various Creams. Symptoms are: gradually worsening.  Triage Disposition: See Physician Within 24 Hours  Patient/caregiver understands and will follow disposition?: Yes   **Appt. Scheduled for 6/27**       Copied from CRM #[id number]. Topic: Clinical - Medical Advice >> Aug 07, 2023  4:34 PM [name] wrote: Reason for CRM: Patient is itching and she stated she doesn't want to go through the weekend itching she asking if someone could call her please follow up with patient Reason for Disposition  SEVERE itching (i.e., interferes with sleep, normal activities or school)  Answer Assessment - Initial Assessment Questions 1. APPEARANCE of RASH: Describe the rash. (e.g., spots, blisters, raised areas, skin peeling, scaly)     Red scaly areas  2. SIZE: How big are the spots? (e.g., tip of pen, eraser, coin; inches, centimeters)     3. LOCATION: Where is the rash located?  BIL Hands, bends of elbows are BIL knees, back of neck   4. COLOR: What color is the rash? (Note: It is difficult to assess rash color in people with darker-colored skin. When this situation occurs, simply ask the caller to describe what they see.)  Red areas      5. ONSET: When did the rash begin?     X1 week symptoms have worsened, chronic problem.   6. FEVER: Do you have a fever? If Yes, ask: What is your temperature, how was it measured, and when did it start?     No  7. ITCHING: Does the rash itch? If Yes, ask: How bad is the itch? (Scale 1-10; or mild, moderate, severe)     Moderate-severe, stingy feeling  8. CAUSE: What do you think is causing the rash?     Eczema   9. MEDICINE FACTORS: Have you started any new medicines within the last 2 weeks? (e.g.,  antibiotics)     No  10. OTHER SYMPTOMS: Do you have any other symptoms? (e.g., dizziness, headache, sore throat, joint pain) No        OTC creams aren't working. Steroid cream is providing some relief. Benadryl not providing relief. This is a reoccurring problem for patient but symptoms are worsening.  Protocols used: Rash or Redness - [facility]

## 2023-08-07 NOTE — Telephone Encounter (Signed)
 Copied from CRM [id number]. Topic: Clinical - Medical Advice >> Aug 07, 2023  8:14 AM Robinson H wrote: Reason for CRM: Patient would like to know if Dr. Garald can send in something to relive the itching until she can get in with Dermatology, referral is pending in system.  Arrow [phone] >> Aug 07, 2023  3:28 PM Chiquita SQUIBB wrote: Patient is calling in again stating she is still itching and asking for the medication to be sent in as soon as possible. Informed patient her message have been sent to the doctor.

## 2023-08-07 NOTE — Telephone Encounter (Signed)
 Copied from CRM [id number]. Topic: Clinical - Medical Advice >> Aug 07, 2023  8:14 AM Robinson H wrote: Reason for CRM: Patient would like to know if Dr. Garald can send in something to relive the itching until she can get in with Dermatology, referral is pending in system.  Kerissa [phone]

## 2023-08-08 ENCOUNTER — Ambulatory Visit (INDEPENDENT_AMBULATORY_CARE_PROVIDER_SITE_OTHER): Admitting: Internal Medicine

## 2023-08-08 ENCOUNTER — Encounter: Payer: Self-pay | Admitting: Family Medicine

## 2023-08-08 ENCOUNTER — Encounter: Payer: Self-pay | Admitting: Internal Medicine

## 2023-08-08 VITALS — BP 122/78 | HR 108 | Temp 98.2°F | Ht 63.0 in

## 2023-08-08 DIAGNOSIS — E538 Deficiency of other specified B group vitamins: Secondary | ICD-10-CM

## 2023-08-08 DIAGNOSIS — R21 Rash and other nonspecific skin eruption: Secondary | ICD-10-CM | POA: Diagnosis not present

## 2023-08-08 DIAGNOSIS — E559 Vitamin D deficiency, unspecified: Secondary | ICD-10-CM

## 2023-08-08 MED ORDER — TRIAMCINOLONE ACETONIDE 0.1 % EX CREA: 1.0000 | TOPICAL_CREAM | Freq: Two times a day (BID) | CUTANEOUS | 1 refills | Status: DC

## 2023-08-08 NOTE — Patient Instructions (Signed)
 You had the steroid shot today  Please take all new medication as prescribed- the prednisone , and the steroid cream as needed  Please continue all other medications as before, and refills have been done if requested.  Please have the pharmacy call with any other refills you may need.  Please keep your appointments with your specialists as you may have planned

## 2023-08-08 NOTE — Assessment & Plan Note (Addendum)
 C/w allergic type, etiology unclear, for depomedrol 80 mg IM, prednisone  taper, benadryl 25 mg prn,  to f/u any worsening symptoms or concerns, also triam cr prn

## 2023-08-08 NOTE — Assessment & Plan Note (Signed)
 Last vitamin D  Lab Results  Component Value Date   VD25OH 48.51 03/20/2023   Stable, cont oral replacement

## 2023-08-08 NOTE — Assessment & Plan Note (Signed)
 Lab Results  Component Value Date   VITAMINB12 >1537 (H) 03/20/2023   Stable, cont oral replacement - b12 1000 mcg qd

## 2023-08-08 NOTE — Progress Notes (Signed)
 Patient ID: [name] [name], female   DOB: 1940-09-14, 83 y.o.   MRN: [id number]        Chief Complaint: follow up rash, lwo vit d, b12 deficiency       HPI:  [name] [name] is a 83 y.o. female here with new onset diffuse rash to the torso and mostly prox extremities with itching.  Pt denies chest pain, increased sob or doe, wheezing, orthopnea, PND, increased LE swelling, palpitations, dizziness or syncope.  No tongue or lip swelling.   Pt denies polydipsia, polyuria, or new focal neuro s/s.         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)   HTN (hypertension)    Hyperlipidemia    Hypothyroidism    Personal history of chemotherapy 2002   Personal history of radiation therapy 2002   Psoriasis    S/P thyroidectomy 07/2005   2 cm largest diameter (1 other tiny focus)/ i-131 rx 99 [facility] 08/2005   Thyroid  cancer (HCC)    Papillary Stage 1 - Dr Kassie   Vitamin B12 deficiency 2009   Vitamin D  deficiency 2009   Past Surgical History:  Procedure Laterality Date   BREAST LUMPECTOMY     BREAST LUMPECTOMY WITH RADIOACTIVE SEED AND SENTINEL LYMPH NODE BIOPSY Left 09/11/2016   Procedure: LEFT BREAST LUMPECTOMY WITH RADIOACTIVE SEED AND LEFT AXILLARY SENTINEL LYMPH NODE BIOPSY WITH BLUE DYE INJECTION;  Surgeon: Gail Favorite, MD;  Location: [facility];  Service: General;  Laterality: Left;   IR FLUORO GUIDE PORT INSERTION RIGHT  09/13/2016   IR REMOVAL TUN ACCESS W/ PORT W/O FL MOD SED  12/30/2016   IR US  GUIDE VASC  ACCESS RIGHT  09/13/2016   MASTECTOMY Right    Right   PORTACATH PLACEMENT N/A 09/11/2016   Procedure: ATTEMPTED INSERTION PORT-A-CATH WITH ULTRA SOUND GUIDANCE;  Surgeon: Gail Favorite, MD;  Location: [facility];  Service: General;  Laterality: N/A;   REDUCTION MAMMAPLASTY Left 2005   THYROIDECTOMY  2007    reports that she has never smoked. She has never used smokeless tobacco. She reports that she does not drink alcohol and does not use drugs. family history includes Allergies in her daughter, mother, sister, and sister; Asthma in her brother, daughter, mother, and sister; Breast cancer (age of onset: 36) in her sister; Breast cancer (age of onset: 28) in her sister; Cancer in her paternal aunt; Clotting disorder in her mother; Heart disease (age of onset: 74) in her father; Hyperparathyroidism in her sister; Kidney cancer in her paternal aunt; Leukemia in her brother; Liver cancer in her paternal uncle; Lung cancer in her maternal grandfather; Lymphoma in her sister; Pancreatic cancer in her paternal grandfather; Stroke in her mother; Throat cancer in her maternal grandmother. Allergies  Allergen Reactions   Pneumococcal Vaccines Other (See Comments)    Was really sick    Citalopram   Insomnia   Sulfa Antibiotics Other (See Comments)    Pt believes it was hives   Sulfacetamide Sodium-Sulfur     Sulfamethoxazole-Trimethoprim     Other reaction(s): Unknown   Tape Itching, Dermatitis and Rash   Tegaderm Ag Mesh [Silver] Itching and Rash   Current Outpatient Medications on File Prior to Visit  Medication Sig Dispense Refill   ALPRAZolam  (XANAX ) 1 MG tablet Take 1 tablet (1 mg total) by mouth 3 (three) times daily as needed for anxiety. 90 tablet 2   anastrozole  (ARIMIDEX ) 1 MG tablet TAKE 1 TABLET BY MOUTH EVERY DAY 90 tablet 1   cholecalciferol (VITAMIN D ) 1000 units tablet Take 2 tablets (2,000 Units total) by mouth daily. (Patient not taking: Reported on 08/04/2023)  100 tablet 3   clobetasol  (TEMOVATE ) 0.05 % external solution Apply 1 Application topically 2 (two) times daily. (Patient not taking: Reported on 08/04/2023) 50 mL 3   clopidogrel  (PLAVIX ) 75 MG tablet TAKE 1 TABLET (75 MG TOTAL) BY MOUTH DAILY FOR 21 DAYS. 90 tablet 3   donepezil  (ARICEPT ) 5 MG tablet Take 1 tablet (5 mg total) by mouth at bedtime. (Patient not taking: Reported on 08/04/2023) 90 tablet 1   DULoxetine  (CYMBALTA ) 60 MG capsule TAKE ONE CAPSULE BY MOUTH EVERY DAY Oral; Duration: 30 (Patient not taking: Reported on 08/04/2023)     guaiFENesin  (MUCINEX ) 600 MG 12 hr tablet Take 1 tablet (600 mg total) by mouth 2 (two) times daily. (Patient not taking: Reported on 08/04/2023) 60 tablet 1   levothyroxine  (SYNTHROID ) 112 MCG tablet TAKE 1 TABLET BY MOUTH EVERY DAY 90 tablet 3   liothyronine  (CYTOMEL ) 25 MCG tablet Take 0.5 tablets (12.5 mcg total) by mouth daily. (Patient not taking: Reported on 08/04/2023) 45 tablet 3   meclizine  (ANTIVERT ) 12.5 MG tablet TAKE 1 TABLET BY MOUTH 3 TIMES DAILY AS NEEDED FOR DIZZINESS. (Patient not taking: Reported on 08/04/2023) 60 tablet 1   metoprolol  succinate (TOPROL -XL) 25 MG 24 hr tablet Take 12.5 mg by mouth at bedtime. (Patient not taking: Reported on 08/04/2023)     ofloxacin  (OCUFLOX ) 0.3 % ophthalmic solution Place 1 drop into the left eye 4 (four) times daily. (Patient not taking: Reported on 08/04/2023)     pantoprazole  (PROTONIX ) 40 MG tablet Take 1 tablet (40 mg total) by mouth 2 (two) times daily. (Patient not taking: Reported on 08/04/2023) 180 tablet 3   prednisoLONE acetate (PRED FORTE) 1 % ophthalmic suspension PLEASE SEE ATTACHED FOR DETAILED DIRECTIONS (Patient not taking: Reported on 08/04/2023)     rosuvastatin  (CRESTOR ) 20 MG tablet TAKE 1 TABLET BY MOUTH EVERY DAY 90 tablet 1   telmisartan  (MICARDIS ) 20 MG tablet Take 20 mg by mouth daily. (Patient not taking: Reported on 08/04/2023)     venlafaxine  XR (EFFEXOR -XR) 75 MG 24 hr capsule TAKE 1  CAPSULE (75 MG TOTAL) DAILY WITH BREAKFAST BY MOUTH. Oral; Duration: 30 (Patient not taking: Reported on 08/04/2023)     vitamin B-12 (CYANOCOBALAMIN ) 1000 MCG tablet Take 1,000 mcg by mouth daily.     Current Facility-Administered Medications on File Prior to Visit  Medication Dose Route Frequency Provider Last Rate Last Admin   denosumab  (PROLIA ) injection 60 mg  60 mg Subcutaneous Once Causey, Lindsey Cornetto, NP            ROS:  All others reviewed and negative.  Objective        PE:  BP 122/78 (BP Location: Left Arm, Patient Position: Sitting, Cuff Size: Normal)  Pulse (!) 108   Temp 98.2 F (36.8 C) (Oral)   Ht 5' 3 (1.6 m)   SpO2 98%   BMI 26.93 kg/m                 Constitutional: Pt appears in NAD               HENT: Head: NCAT.                Right Ear: External ear normal.                 Left Ear: External ear normal.                Eyes: . Pupils are equal, round, and reactive to light. Conjunctivae and EOM are normal               Nose: without d/c or deformity               Neck: Neck supple. Gross normal ROM               Cardiovascular: Normal rate and regular rhythm.                 Pulmonary/Chest: Effort normal and breath sounds without rales or wheezing.                               Neurological: Pt is alert. At baseline orientation, motor grossly intact               Skin: Skin is warm. LE edema - none, diffuse erythem spot rash diffuse torso and prox extremites               Psychiatric: Pt behavior is normal without agitation   Micro: none  Cardiac tracings I have personally interpreted today:  none  Pertinent Radiological findings (summarize): none   Lab Results  Component Value Date   WBC 9.9 07/09/2023   HGB 9.8 (L) 07/09/2023   HCT 31.6 (L) 07/09/2023   PLT 392.0 07/09/2023   GLUCOSE 190 (H) 07/09/2023   CHOL 179 03/20/2023   TRIG 326.0 (H) 03/20/2023   HDL 46.20 03/20/2023   LDLDIRECT 125.0 06/05/2015   LDLCALC 67 03/20/2023   ALT 15  07/09/2023   AST 27 07/09/2023   NA 136 07/09/2023   K 3.9 07/09/2023   CL 105 07/09/2023   CREATININE 1.47 (H) 07/09/2023   BUN 21 07/09/2023   CO2 23 07/09/2023   TSH 3.53 03/20/2023   INR 1.1 08/16/2021   HGBA1C 6.2 06/20/2022   Assessment/Plan:  RHEALYNN MYHRE is a 83 y.o. White or Caucasian [1] female with  has a past medical history of Allergic rhinitis, Anxiety, Breast cancer (HCC) (hx 2004), Family history of breast cancer, Genetic testing (12/05/2016), HTN (hypertension), Hyperlipidemia, Hypothyroidism, Personal history of chemotherapy (2002), Personal history of radiation therapy (2002), Psoriasis, S/P thyroidectomy (07/2005), Thyroid  cancer (HCC), Vitamin B12 deficiency (2009), and Vitamin D  deficiency (2009).  Vitamin D  deficiency Last vitamin D  Lab Results  Component Value Date   VD25OH 48.51 03/20/2023   Stable, cont oral replacement   Rash C/w allergic type, etiology unclear, for depomedrol 80 mg IM, prednisone  taper, benadryl 25 mg prn,  to f/u any worsening symptoms or concerns, also triam cr prn  B12 deficiency Lab Results  Component Value Date   VITAMINB12 >1537 (H) 03/20/2023   Stable, cont oral replacement -  b12 1000 mcg qd  Followup: Return if symptoms worsen or fail to improve.  Lynwood Rush, MD 08/08/2023 7:58 PM [facility]

## 2023-08-11 ENCOUNTER — Ambulatory Visit: Payer: Self-pay

## 2023-08-11 ENCOUNTER — Other Ambulatory Visit: Payer: Self-pay

## 2023-08-11 ENCOUNTER — Other Ambulatory Visit: Payer: Self-pay | Admitting: Internal Medicine

## 2023-08-11 MED ORDER — PREDNISONE 10 MG PO TABS
ORAL_TABLET | ORAL | 0 refills | Status: DC
Start: 2023-08-11 — End: 2023-08-11

## 2023-08-11 MED ORDER — METHYLPREDNISOLONE ACETATE 40 MG/ML IJ SUSP
40.0000 mg | Freq: Once | INTRAMUSCULAR | Status: AC
  Administered 2023-08-11: 40 mg via INTRAMUSCULAR

## 2023-08-11 MED ORDER — PREDNISONE 10 MG PO TABS: ORAL_TABLET | ORAL | 0 refills | Status: DC

## 2023-08-11 NOTE — Telephone Encounter (Signed)
 Ok this is done

## 2023-08-11 NOTE — Addendum Note (Signed)
 Addended by: NORLEEN LYNWOOD ORN on: 08/11/2023 04:39 PM   Modules accepted: Orders

## 2023-08-11 NOTE — Addendum Note (Signed)
 Addended by: WONDA BETTER on: 08/11/2023 08:42 AM   Modules accepted: Orders

## 2023-08-11 NOTE — Telephone Encounter (Signed)
Ok this was done 

## 2023-08-11 NOTE — Telephone Encounter (Signed)
 Patient was seen in office on Friday. Stated she went to pharmacy afterwards and stated the pharmacist stated there are no prescriptions there for the patient.   Patient states she was supposed to have prednisone  pills called in to the pharmacy but they were not called in. Please advise. Patient would like prednisone  pills called in to [facility] on [address].    Copied from CRM [id number]. Topic: Clinical - Medication Question >> Aug 11, 2023  8:53 AM Myrick T wrote: Reason for CRM: patient called to see if she could get another round of prednisone  as she is still itching all over. Please f/u with patient

## 2023-08-13 ENCOUNTER — Ambulatory Visit: Admitting: Gastroenterology

## 2023-08-13 MED ORDER — HYDROXYZINE HCL 25 MG PO TABS: 12.5000 mg | ORAL_TABLET | Freq: Three times a day (TID) | ORAL | 1 refills | Status: DC | PRN

## 2023-08-13 NOTE — Telephone Encounter (Signed)
Please see previous message. Thanks

## 2023-08-13 NOTE — Telephone Encounter (Signed)
 I emailed prescription for hydroxyzine to the pharmacy.  Please see us  if problems.  Thank you

## 2023-08-13 NOTE — Telephone Encounter (Signed)
 Provider has stated I emailed prescription for hydroxyzine to the pharmacy. Please see us  if problems. Thank you   Please inform the pt of he above if she calls back.

## 2023-08-13 NOTE — Progress Notes (Deleted)
 SABRA

## 2023-08-19 ENCOUNTER — Ambulatory Visit: Admitting: Internal Medicine

## 2023-08-19 ENCOUNTER — Ambulatory Visit (HOSPITAL_BASED_OUTPATIENT_CLINIC_OR_DEPARTMENT_OTHER): Admitting: Student

## 2023-08-20 DIAGNOSIS — H353221 Exudative age-related macular degeneration, left eye, with active choroidal neovascularization: Secondary | ICD-10-CM | POA: Diagnosis not present

## 2023-08-20 NOTE — Progress Notes (Unsigned)
   LILLETTE Ileana Collet, PhD, LAT, ATC acting as a scribe for Artist Lloyd, MD.  [name] [name] is a 83 y.o. female who presents to [facility] today for f/u  L knee pain do to a transverse fx patella. Pt was last seen by Dr. Lloyd on 07/11/23 and was advised to plan for immobilization w/ a knee immobilizer and plan for limited weightbearing. Also referred to orthopedic surgery.  Today, pt reports her knee has been feeling pretty good. She has been standing and working in the garage and will put ice on it when it starts to hurt. She is really wanting to get back to driving again.  She has been walking around on this knee without any immobilizer or any brace for the last several weeks.  She uses the wheelchair sometimes.  She does have several walkers at home but does not use them regularly. She has an appointment scheduled next week with the PA at orthopedics that has been managing this condition.  Dx imaging: 07/29/23 L knee XR 07/09/23 L knee XR   Pertinent review of systems: No fevers or chills  Relevant historical information: Cerebrovascular disease.  Hypoparathyroidism.   Exam:  BP 138/84   Pulse 94   Ht 5' 3 (1.6 m)   SpO2 98%   BMI 26.93 kg/m  General: Well Developed, well nourished, and in no acute distress.   MSK: Left knee mild effusion mildly tender palpation normal motion. Gait: Patient stood right up from the wheelchair and walked from 1 side of the room to the next.  She was a little bit unstable from a balance perspective but did not display any weakness.    Lab and Radiology Results  X-ray images left knee obtained today personally and independently interpreted. Healing patellar fracture. Await formal radiology review     Assessment and Plan: 83 y.o. female with left knee patellar fracture healing over the last 6 weeks.  Agree with management strategies from orthopedics.  Follow-up with orthopedics as scheduled next week.  Okay to  advance activity.  I do not think she needs a wheelchair anymore.  I do encourage her to use a walker while ambulating and generally take it a little bit easy but overall she looks pretty good.   PDMP not reviewed this encounter. Orders Placed This Encounter  Procedures   DG Knee AP/LAT W/Sun[place] Left    Standing Status:   Future    Number of Occurrences:   1    Expiration Date:   09/21/2023    Reason for Exam (SYMPTOM  OR DIAGNOSIS REQUIRED):   left knee pain    Preferred imaging location?:   [place] [facility]   No orders of the defined types were placed in this encounter.    Discussed warning signs or symptoms. Please see discharge instructions. Patient expresses understanding.   The above documentation has been reviewed and is accurate and complete Artist Lloyd, M.D.

## 2023-08-21 ENCOUNTER — Ambulatory Visit

## 2023-08-21 ENCOUNTER — Ambulatory Visit: Admitting: Family Medicine

## 2023-08-21 VITALS — BP 138/84 | HR 94 | Ht 63.0 in

## 2023-08-21 DIAGNOSIS — S82035D Nondisplaced transverse fracture of left patella, subsequent encounter for closed fracture with routine healing: Secondary | ICD-10-CM

## 2023-08-21 DIAGNOSIS — S82035A Nondisplaced transverse fracture of left patella, initial encounter for closed fracture: Secondary | ICD-10-CM

## 2023-08-21 DIAGNOSIS — S82002D Unspecified fracture of left patella, subsequent encounter for closed fracture with routine healing: Secondary | ICD-10-CM | POA: Diagnosis not present

## 2023-08-21 NOTE — Patient Instructions (Addendum)
 Thank you for coming in today.   Check back as needed  OK to start walking using a walker.  Follow-up with the PA  Reminder: Dr. Joane will be out of the office starting August 1st, for about 6 weeks

## 2023-08-25 ENCOUNTER — Ambulatory Visit: Payer: Self-pay | Admitting: Family Medicine

## 2023-08-25 NOTE — Progress Notes (Signed)
 Left knee x-ray shows healing of the patellar fracture.

## 2023-08-27 ENCOUNTER — Ambulatory Visit

## 2023-08-28 ENCOUNTER — Ambulatory Visit

## 2023-08-28 VITALS — Ht 63.0 in | Wt 145.0 lb

## 2023-08-28 DIAGNOSIS — Z0000 Encounter for general adult medical examination without abnormal findings: Secondary | ICD-10-CM

## 2023-08-28 NOTE — Progress Notes (Cosign Needed Addendum)
 Subjective:   [name] [name] is a 83 y.o. who presents for a Medicare Wellness preventive visit.  As a reminder, Annual Wellness Visits don't include a physical exam, and some assessments may be limited, especially if this visit is performed virtually. We may recommend an in-person follow-up visit with your provider if needed.  Visit Complete: Virtual I connected with  [name] [name] on 08/28/23 by a audio enabled telemedicine application and verified that I am speaking with the correct person using two identifiers.  Patient Location: Home  Provider Location: Office/Clinic  I discussed the limitations of evaluation and management by telemedicine. The patient expressed understanding and agreed to proceed.  Vital Signs: Because this visit was a virtual/telehealth visit, some criteria may be missing or patient reported. Any vitals not documented were not able to be obtained and vitals that have been documented are patient reported.  VideoDeclined- This patient declined Librarian, academic. Therefore the visit was completed with audio only.  Persons Participating in Visit: Patient.  AWV Questionnaire: No: Patient Medicare AWV questionnaire was not completed prior to this visit.  Cardiac Risk Factors include: advanced age (>18men, >66 women);dyslipidemia;hypertension     Objective:    Today's Vitals   08/28/23 1057  Weight: 145 lb (65.8 kg)  Height: 5' 3 (1.6 m)   Body mass index is 25.69 kg/m.     08/28/2023   10:57 AM 06/21/2022    9:20 AM 02/27/2022    2:13 PM 08/16/2021    7:40 PM 06/18/2021   10:03 AM 06/18/2021   10:02 AM 10/12/2020    1:55 PM  Advanced Directives  Does Patient Have a Medical Advance Directive? Yes Yes No No  Yes No  Type of Estate agent of [place];Living will Healthcare Power of [place];Living will   Healthcare Power of [place];Living will Healthcare Power of [place];Living will   Copy of Healthcare  Power of Attorney in Chart? No - copy requested No - copy requested   No - copy requested No - copy requested   Would patient like information on creating a medical advance directive?   No - Patient declined    No - Patient declined    Current Medications (verified) Outpatient Encounter Medications as of 08/28/2023  Medication Sig   ALPRAZolam  (XANAX ) 1 MG tablet Take 1 tablet (1 mg total) by mouth 3 (three) times daily as needed for anxiety.   anastrozole  (ARIMIDEX ) 1 MG tablet TAKE 1 TABLET BY MOUTH EVERY DAY   clopidogrel  (PLAVIX ) 75 MG tablet TAKE 1 TABLET (75 MG TOTAL) BY MOUTH DAILY FOR 21 DAYS.   hydrOXYzine  (ATARAX ) 25 MG tablet Take 0.5-1 tablets (12.5-25 mg total) by mouth every 8 (eight) hours as needed for itching.   levothyroxine  (SYNTHROID ) 112 MCG tablet TAKE 1 TABLET BY MOUTH EVERY DAY   liothyronine  (CYTOMEL ) 25 MCG tablet Take 0.5 tablets (12.5 mcg total) by mouth daily.   meclizine  (ANTIVERT ) 12.5 MG tablet TAKE 1 TABLET BY MOUTH 3 TIMES DAILY AS NEEDED FOR DIZZINESS.   prednisoLONE acetate (PRED FORTE) 1 % ophthalmic suspension PLEASE SEE ATTACHED FOR DETAILED DIRECTIONS   rosuvastatin  (CRESTOR ) 20 MG tablet TAKE 1 TABLET BY MOUTH EVERY DAY   telmisartan  (MICARDIS ) 20 MG tablet Take 20 mg by mouth daily.   triamcinolone  cream (KENALOG ) 0.1 % Apply 1 Application topically 2 (two) times daily.   vitamin B-12 (CYANOCOBALAMIN ) 1000 MCG tablet Take 1,000 mcg by mouth daily.   cholecalciferol (VITAMIN D ) 1000 units tablet  Take 2 tablets (2,000 Units total) by mouth daily. (Patient not taking: Reported on 08/28/2023)   clobetasol  (TEMOVATE ) 0.05 % external solution Apply 1 Application topically 2 (two) times daily. (Patient not taking: Reported on 08/28/2023)   donepezil  (ARICEPT ) 5 MG tablet Take 1 tablet (5 mg total) by mouth at bedtime. (Patient not taking: Reported on 08/28/2023)   DULoxetine  (CYMBALTA ) 60 MG capsule TAKE ONE CAPSULE BY MOUTH EVERY DAY Oral; Duration: 30 (Patient  not taking: Reported on 08/28/2023)   metoprolol  succinate (TOPROL -XL) 25 MG 24 hr tablet Take 12.5 mg by mouth at bedtime. (Patient not taking: Reported on 08/28/2023)   ofloxacin  (OCUFLOX ) 0.3 % ophthalmic solution Place 1 drop into the left eye 4 (four) times daily. (Patient not taking: Reported on 08/28/2023)   pantoprazole  (PROTONIX ) 40 MG tablet Take 1 tablet (40 mg total) by mouth 2 (two) times daily. (Patient not taking: Reported on 08/28/2023)   venlafaxine  XR (EFFEXOR -XR) 75 MG 24 hr capsule TAKE 1 CAPSULE (75 MG TOTAL) DAILY WITH BREAKFAST BY MOUTH. Oral; Duration: 30 (Patient not taking: Reported on 08/28/2023)   Facility-Administered Encounter Medications as of 08/28/2023  Medication   denosumab  (PROLIA ) injection 60 mg    Allergies (verified) Pneumococcal vaccines, Citalopram , Sulfa antibiotics, Sulfacetamide sodium-sulfur , Sulfamethoxazole-trimethoprim, Tape, and Tegaderm ag mesh [silver]   History: 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)   HTN (hypertension)    Hyperlipidemia    Hypothyroidism    Personal history of chemotherapy 2002   Personal history of radiation therapy 2002   Psoriasis    S/P thyroidectomy 07/2005   2 cm largest diameter (1 other tiny focus)/ i-131 rx 99 [facility] 08/2005   Thyroid  cancer (HCC)    Papillary Stage 1 - Dr Kassie   Vitamin B12 deficiency 2009   Vitamin D  deficiency 2009   Past Surgical History:  Procedure Laterality Date   BREAST LUMPECTOMY     BREAST LUMPECTOMY WITH RADIOACTIVE SEED AND SENTINEL LYMPH NODE BIOPSY Left 09/11/2016   Procedure: LEFT BREAST LUMPECTOMY WITH RADIOACTIVE SEED AND LEFT AXILLARY SENTINEL LYMPH NODE BIOPSY WITH BLUE DYE INJECTION;  Surgeon: Gail Favorite, MD;  Location: [facility];  Service: General;  Laterality: Left;   IR FLUORO GUIDE PORT INSERTION RIGHT  09/13/2016   IR REMOVAL TUN ACCESS W/ PORT W/O FL MOD SED  12/30/2016   IR US  GUIDE VASC ACCESS RIGHT  09/13/2016   MASTECTOMY Right    Right   PORTACATH PLACEMENT N/A 09/11/2016   Procedure: ATTEMPTED INSERTION PORT-A-CATH WITH ULTRA SOUND GUIDANCE;  Surgeon: Gail Favorite, MD;  Location: [facility];  Service: General;  Laterality: N/A;   REDUCTION MAMMAPLASTY Left 2005   THYROIDECTOMY  2007   Family History  Problem Relation Age of Onset   Stroke Mother    Allergies Mother    Asthma Mother    Clotting disorder Mother    Heart disease Father 47       MI   Allergies Sister    Asthma Sister    Breast cancer Sister 18       Lymphoma 11; currently 3   Lymphoma Sister    Hyperparathyroidism Sister    Asthma Brother    Leukemia  Brother        dx 39s   Allergies Daughter    Asthma Daughter    Allergies Sister    Breast cancer Sister 16       currently 33   Kidney cancer Paternal Aunt        kidney ca; deceased 82   Liver cancer Paternal Uncle        unk. 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  Other Topics Concern   Not on file  Social History  Narrative   GI - Dr Luis   Depression - Dr Vincente   GYN - Dr JONETTA Fu      Social Drivers of Health   Financial Resource Strain: Low Risk  (08/28/2023)   Overall Financial Resource Strain (CARDIA)    Difficulty of Paying Living Expenses: Not hard at all  Food Insecurity: No Food Insecurity (08/28/2023)   Hunger Vital Sign    Worried About Running Out of Food in the Last Year: Never true    Ran Out of Food in the Last Year: Never true  Transportation Needs: No Transportation Needs (08/28/2023)   PRAPARE - Administrator, Civil Service (Medical): No    Lack of Transportation (Non-Medical): No  Physical Activity: Inactive (08/28/2023)   Exercise Vital Sign    Days of Exercise per Week: 0 days    Minutes of Exercise per Session: 0 min  Stress: No Stress Concern Present (08/28/2023)   [employer] of Occupational Health - Occupational Stress Questionnaire    Feeling of Stress: Only a little  Social Connections: Socially Integrated (08/28/2023)   Social Connection and Isolation Panel    Frequency of Communication with Friends and Family: More than three times a week    Frequency of Social Gatherings with Friends and Family: More than three times a week    Attends Religious Services: More than 4 times per year    Active Member of [employer] or Organizations: Yes    Attends Engineer, structural: More than 4 times per year    Marital Status: Married    Tobacco Counseling Counseling given: No    Clinical Intake:  Pre-visit preparation completed: Yes  Pain : No/denies pain     BMI - recorded: 25.69 Nutritional Risks: None Diabetes: No  Lab Results  Component Value Date   HGBA1C 6.2 06/20/2022   HGBA1C 6.4 06/18/2021   HGBA1C 6.4 (H) 08/10/2020     How often do you need to have someone help you when you read instructions, pamphlets, or  other written materials from your doctor or pharmacy?: 1 - Never  Interpreter Needed?: No  Information entered by ::  [name] [name], CMA   Activities of Daily Living     08/28/2023   11:04 AM  In your present state of health, do you have any difficulty performing the following activities:  Hearing? 0  Vision? 0  Difficulty concentrating or making decisions? 0  Walking or climbing stairs? 0  Dressing or bathing? 0  Doing errands, shopping? 0  Preparing Food and eating ? N  Using the Toilet? N  In the past six months, have you accidently leaked urine? N  Do you have problems with loss of bowel control? N  Managing your Medications? N  Managing your Finances? N  Housekeeping or managing your Housekeeping? N    Patient Care Team: Plotnikov, Karlynn GAILS, MD as PCP - General Causey, Morna Pickle, NP as Nurse Practitioner (Hematology and Oncology) Jeffrie Oneil BROCKS, MD as Consulting Physician (Cardiology) Pecen, Paula E, MD as Consulting Physician (Optometry)  I have updated your Care Teams any recent Medical Services you may have received from other providers in the past year.     Assessment:   This is a routine wellness examination for [name] [name].  Hearing/Vision screen Hearing Screening - Comments:: Denies hearing difficulties   Vision Screening - Comments:: Wears eyeglasses - up to date with routine eye exams with Dr Naomi    Goals Addressed               This Visit's Progress     Patient Stated (pt-stated)        Patient stated she plans to manage her discomfort of left knee due to a fall       Depression Screen     08/28/2023   11:06 AM 08/08/2023    4:13 PM 08/08/2023    4:04 PM 07/09/2023    2:36 PM 05/27/2023    4:17 PM 03/20/2023    3:14 PM 12/30/2022    1:57 PM  PHQ 2/9 Scores  PHQ - 2 Score 0 0 0 4 0 0 0  PHQ- 9 Score 0 0 0 12       Fall Risk     08/28/2023   11:04 AM 08/04/2023    2:42 PM 05/27/2023    4:27 PM 03/20/2023    3:14 PM 12/30/2022    1:56 PM  Fall Risk   Falls in the past year? 1 1 0 0 0  Number falls in past yr: 0 0 0 0 0  Comment 1      Injury with  Fall? 1 1 0 0 0  Comment left knee pain      Risk for fall due to :  Impaired balance/gait No Fall Risks Impaired balance/gait No Fall Risks  Follow up Falls evaluation completed;Falls prevention discussed Falls evaluation completed Falls evaluation completed Falls evaluation completed Falls evaluation completed    MEDICARE RISK AT HOME:  Medicare Risk at Home Any stairs in or around the home?: No If so, are there any without handrails?: No Home free of loose throw rugs in walkways, pet beds, electrical cords, etc?: Yes Adequate lighting in your home to reduce risk of falls?: Yes Life alert?: No Use of a cane, walker or w/c?: Yes (cane/walker/wheelchair) Grab bars in the bathroom?: No Shower chair or bench in shower?: No Elevated toilet seat or a handicapped toilet?: Yes  TIMED UP AND GO:  Was the test performed?  No  Cognitive Function: 6CIT completed        08/28/2023   11:08 AM 06/21/2022    9:17 AM  6CIT Screen  What Year? 0 points 0 points  What month? 0 points 0 points  What time? 0 points 0 points  Count back from 20 0 points 0 points  Months in reverse 0 points 0 points  Repeat phrase 2 points 0 points  Total Score 2 points 0 points    Immunizations Immunization History  Administered Date(s) Administered   PFIZER(Purple Top)SARS-COV-2 Vaccination 04/15/2019, 05/12/2019, 12/25/2019   Pneumococcal Conjugate-13 06/05/2015   Pneumococcal Polysaccharide-23 05/02/2020   Td 05/26/2012    Screening Tests Health Maintenance  Topic Date Due   COVID-19 Vaccine (4 - 2024-25 season) 10/13/2022   DTaP/Tdap/Td (2 - Tdap) 10/08/2023 (Originally 05/27/2022)   Medicare Annual Wellness (AWV)  08/27/2024   Pneumococcal Vaccine: 50+ Years  Completed   DEXA SCAN  Completed   Hepatitis B Vaccines  Aged Out   HPV VACCINES  Aged Out   Meningococcal B Vaccine  Aged Out   INFLUENZA VACCINE  Discontinued   Zoster Vaccines- Shingrix  Discontinued    Health Maintenance  Health  Maintenance Due  Topic Date Due   COVID-19 Vaccine (4 - 2024-25 season) 10/13/2022   Health Maintenance Items Addressed:08/28/2023   Additional Screening:  Vision Screening: Recommended annual ophthalmology exams for early detection of glaucoma and other disorders of the eye. Would you like a referral to an eye doctor? No    Dental Screening: Recommended annual dental exams for proper oral hygiene  Community Resource Referral / Chronic Care Management: CRR required this visit?  No   CCM required this visit?  No   Plan:    I have personally reviewed and noted the following in the patient's chart:   Medical and social history Use of alcohol, tobacco or illicit drugs  Current medications and supplements including opioid prescriptions. Patient is not currently taking opioid prescriptions. Functional ability and status Nutritional status Physical activity Advanced directives List of other physicians Hospitalizations, surgeries, and ER visits in previous 12 months Vitals Screenings to include cognitive, depression, and falls Referrals and appointments  In addition, I have reviewed and discussed with patient certain preventive protocols, quality metrics, and best practice recommendations. A written personalized care plan for preventive services as well as general preventive health recommendations were provided to patient.   [name] CHRISTELLA [name], CMA   08/28/2023   After Visit Summary: (MyChart) Due to this being a telephonic visit, the after visit summary with patients personalized plan was offered to patient via MyChart   Notes: Nothing significant to report at this time.  Medical screening examination/treatment/procedure(s) were performed by non-physician practitioner and as supervising physician I was immediately available for consultation/collaboration.  I agree with above. Karlynn Noel, MD

## 2023-08-28 NOTE — Patient Instructions (Signed)
 [name] [name] , Thank you for taking time out of your busy schedule to complete your Annual Wellness Visit with me. I enjoyed our conversation and look forward to speaking with you again next year. I, as well as your care team,  appreciate your ongoing commitment to your health goals. Please review the following plan we discussed and let me know if I can assist you in the future. Your Game plan/ To Do List    Follow up Visits: Next Medicare AWV with our clinical staff: 08/30/2024   Have you seen your provider in the last 6 months (3 months if uncontrolled diabetes)? Yes Next Office Visit with your provider: 09/02/2023  Clinician Recommendations:  Aim for 30 minutes of exercise or brisk walking, 6-8 glasses of water, and 5 servings of fruits and vegetables each day. Educated and advised on getting the Tdap (Tetenus) vaccine in 2025 at local pharmacy.      This is a list of the screening recommended for you and due dates:  Health Maintenance  Topic Date Due   COVID-19 Vaccine (4 - 2024-25 season) 10/13/2022   DTaP/Tdap/Td vaccine (2 - Tdap) 10/08/2023*   Medicare Annual Wellness Visit  08/27/2024   Pneumococcal Vaccine for age over 58  Completed   DEXA scan (bone density measurement)  Completed   Hepatitis B Vaccine  Aged Out   HPV Vaccine  Aged Out   Meningitis B Vaccine  Aged Out   Flu Shot  Discontinued   Zoster (Shingles) Vaccine  Discontinued  *Topic was postponed. The date shown is not the original due date.    Advanced directives: (Copy Requested) Please bring a copy of your health care power of attorney and living will to the office to be added to your chart at your convenience. You can mail to [facility] [address] [place], [place] [address] or email to [email or web address] Advance Care Planning is important because it:  [x]  Makes sure you receive the medical care that is consistent with your values, goals, and preferences  [x]  It provides guidance to  your family and loved ones and reduces their decisional burden about whether or not they are making the right decisions based on your wishes.  Follow the link provided in your after visit summary or read over the paperwork we have mailed to you to help you started getting your Advance Directives in place. If you need assistance in completing these, please reach out to us  so that we can help you!

## 2023-09-01 ENCOUNTER — Ambulatory Visit (HOSPITAL_BASED_OUTPATIENT_CLINIC_OR_DEPARTMENT_OTHER): Admitting: Student

## 2023-09-02 ENCOUNTER — Ambulatory Visit (INDEPENDENT_AMBULATORY_CARE_PROVIDER_SITE_OTHER): Admitting: Internal Medicine

## 2023-09-02 ENCOUNTER — Encounter: Payer: Self-pay | Admitting: Internal Medicine

## 2023-09-02 VITALS — BP 129/83 | HR 109 | Temp 98.2°F | Ht 63.0 in | Wt 151.0 lb

## 2023-09-02 DIAGNOSIS — R270 Ataxia, unspecified: Secondary | ICD-10-CM

## 2023-09-02 DIAGNOSIS — E538 Deficiency of other specified B group vitamins: Secondary | ICD-10-CM | POA: Diagnosis not present

## 2023-09-02 DIAGNOSIS — I10 Essential (primary) hypertension: Secondary | ICD-10-CM | POA: Diagnosis not present

## 2023-09-02 DIAGNOSIS — F419 Anxiety disorder, unspecified: Secondary | ICD-10-CM

## 2023-09-02 DIAGNOSIS — K625 Hemorrhage of anus and rectum: Secondary | ICD-10-CM

## 2023-09-02 DIAGNOSIS — L299 Pruritus, unspecified: Secondary | ICD-10-CM

## 2023-09-02 DIAGNOSIS — L2084 Intrinsic (allergic) eczema: Secondary | ICD-10-CM | POA: Diagnosis not present

## 2023-09-02 DIAGNOSIS — E119 Type 2 diabetes mellitus without complications: Secondary | ICD-10-CM

## 2023-09-02 DIAGNOSIS — R5382 Chronic fatigue, unspecified: Secondary | ICD-10-CM

## 2023-09-02 DIAGNOSIS — R739 Hyperglycemia, unspecified: Secondary | ICD-10-CM

## 2023-09-02 LAB — CBC WITH DIFFERENTIAL/PLATELET
Basophils Absolute: 0 K/uL (ref 0.0–0.1)
Basophils Relative: 0.4 % (ref 0.0–3.0)
Eosinophils Absolute: 0.1 K/uL (ref 0.0–0.7)
Eosinophils Relative: 1.9 % (ref 0.0–5.0)
HCT: 32.2 % — ABNORMAL LOW (ref 36.0–46.0)
Hemoglobin: 9.8 g/dL — ABNORMAL LOW (ref 12.0–15.0)
Lymphocytes Relative: 32.2 % (ref 12.0–46.0)
Lymphs Abs: 1.9 K/uL (ref 0.7–4.0)
MCHC: 30.6 g/dL (ref 30.0–36.0)
MCV: 74.2 fl — ABNORMAL LOW (ref 78.0–100.0)
Monocytes Absolute: 0.4 K/uL (ref 0.1–1.0)
Monocytes Relative: 7.4 % (ref 3.0–12.0)
Neutro Abs: 3.4 K/uL (ref 1.4–7.7)
Neutrophils Relative %: 58.1 % (ref 43.0–77.0)
Platelets: 316 K/uL (ref 150.0–400.0)
RBC: 4.34 Mil/uL (ref 3.87–5.11)
RDW: 18.4 % — ABNORMAL HIGH (ref 11.5–15.5)
WBC: 5.9 K/uL (ref 4.0–10.5)

## 2023-09-02 LAB — COMPREHENSIVE METABOLIC PANEL WITH GFR
ALT: 17 U/L (ref 0–35)
AST: 18 U/L (ref 0–37)
Albumin: 4.2 g/dL (ref 3.5–5.2)
Alkaline Phosphatase: 52 U/L (ref 39–117)
BUN: 25 mg/dL — ABNORMAL HIGH (ref 6–23)
CO2: 27 meq/L (ref 19–32)
Calcium: 9.5 mg/dL (ref 8.4–10.5)
Chloride: 106 meq/L (ref 96–112)
Creatinine, Ser: 1.38 mg/dL — ABNORMAL HIGH (ref 0.40–1.20)
GFR: 35.17 mL/min — ABNORMAL LOW (ref >60.00)
Glucose, Bld: 147 mg/dL — ABNORMAL HIGH (ref 70–99)
Potassium: 3.9 meq/L (ref 3.5–5.1)
Sodium: 140 meq/L (ref 135–145)
Total Bilirubin: 0.6 mg/dL (ref 0.2–1.2)
Total Protein: 6.9 g/dL (ref 6.0–8.3)

## 2023-09-02 LAB — HEMOGLOBIN A1C: Hgb A1c MFr Bld: 7.8 % — ABNORMAL HIGH (ref 4.6–6.5)

## 2023-09-02 MED ORDER — HYDROXYZINE HCL 25 MG PO TABS: 25.0000 mg | ORAL_TABLET | Freq: Every evening | ORAL | 2 refills | Status: DC | PRN

## 2023-09-02 NOTE — Assessment & Plan Note (Signed)
 Check CMET, A1c today Cutback on sweets, exercise!

## 2023-09-02 NOTE — Assessment & Plan Note (Signed)
 Chronic No recent falls

## 2023-09-02 NOTE — Patient Instructions (Signed)
 SABRA

## 2023-09-02 NOTE — Assessment & Plan Note (Signed)
 Fall prevention was discussed

## 2023-09-02 NOTE — Progress Notes (Signed)
 Subjective:  Patient ID: [name] [name], female    DOB: 03/22/1940  Age: 83 y.o. MRN: [id number]  CC: Medical Management of Chronic Issues (6 week f/u)   HPI [name] [name] presents for rash 1 month ago. C/o dry skin C/o occ rash and itching. No rash now. C/o Insomnia  Outpatient Medications Prior to Visit  Medication Sig Dispense Refill   ALPRAZolam  (XANAX ) 1 MG tablet Take 1 tablet (1 mg total) by mouth 3 (three) times daily as needed for anxiety. 90 tablet 2   anastrozole  (ARIMIDEX ) 1 MG tablet TAKE 1 TABLET BY MOUTH EVERY DAY 90 tablet 1   clopidogrel  (PLAVIX ) 75 MG tablet TAKE 1 TABLET (75 MG TOTAL) BY MOUTH DAILY FOR 21 DAYS. 90 tablet 3   levothyroxine  (SYNTHROID ) 112 MCG tablet TAKE 1 TABLET BY MOUTH EVERY DAY 90 tablet 3   liothyronine  (CYTOMEL ) 25 MCG tablet Take 0.5 tablets (12.5 mcg total) by mouth daily. 45 tablet 3   meclizine  (ANTIVERT ) 12.5 MG tablet TAKE 1 TABLET BY MOUTH 3 TIMES DAILY AS NEEDED FOR DIZZINESS. 60 tablet 1   prednisoLONE acetate (PRED FORTE) 1 % ophthalmic suspension PLEASE SEE ATTACHED FOR DETAILED DIRECTIONS     rosuvastatin  (CRESTOR ) 20 MG tablet TAKE 1 TABLET BY MOUTH EVERY DAY 90 tablet 1   telmisartan  (MICARDIS ) 20 MG tablet Take 20 mg by mouth daily.     triamcinolone  cream (KENALOG ) 0.1 % Apply 1 Application topically 2 (two) times daily. 30 g 1   vitamin B-12 (CYANOCOBALAMIN ) 1000 MCG tablet Take 1,000 mcg by mouth daily.     hydrOXYzine  (ATARAX ) 25 MG tablet Take 0.5-1 tablets (12.5-25 mg total) by mouth every 8 (eight) hours as needed for itching. 60 tablet 1   donepezil  (ARICEPT ) 5 MG tablet Take 1 tablet (5 mg total) by mouth at bedtime. (Patient not taking: Reported on 09/02/2023) 90 tablet 1   DULoxetine  (CYMBALTA ) 60 MG capsule TAKE ONE CAPSULE BY MOUTH EVERY DAY Oral; Duration: 30 (Patient not taking: Reported on 09/02/2023)     metoprolol  succinate (TOPROL -XL) 25 MG 24 hr tablet Take 12.5 mg by mouth at bedtime. (Patient not taking:  Reported on 09/02/2023)     venlafaxine  XR (EFFEXOR -XR) 75 MG 24 hr capsule TAKE 1 CAPSULE (75 MG TOTAL) DAILY WITH BREAKFAST BY MOUTH. Oral; Duration: 30 (Patient not taking: Reported on 09/02/2023)     cholecalciferol (VITAMIN D ) 1000 units tablet Take 2 tablets (2,000 Units total) by mouth daily. (Patient not taking: Reported on 09/02/2023) 100 tablet 3   clobetasol  (TEMOVATE ) 0.05 % external solution Apply 1 Application topically 2 (two) times daily. (Patient not taking: Reported on 09/02/2023) 50 mL 3   ofloxacin  (OCUFLOX ) 0.3 % ophthalmic solution Place 1 drop into the left eye 4 (four) times daily. (Patient not taking: Reported on 09/02/2023)     pantoprazole  (PROTONIX ) 40 MG tablet Take 1 tablet (40 mg total) by mouth 2 (two) times daily. (Patient not taking: Reported on 09/02/2023) 180 tablet 3   Facility-Administered Medications Prior to Visit  Medication Dose Route Frequency Provider Last Rate Last Admin   denosumab  (PROLIA ) injection 60 mg  60 mg Subcutaneous Once Causey, Lindsey Cornetto, NP        ROS: Review of Systems  Constitutional:  Negative for activity change, appetite change, chills, fatigue and unexpected weight change.  HENT:  Negative for congestion, mouth sores and sinus pressure.   Eyes:  Negative for visual disturbance.  Respiratory:  Negative for cough and chest  tightness.   Cardiovascular:  Negative for leg swelling.  Gastrointestinal:  Negative for abdominal pain and nausea.  Genitourinary:  Negative for difficulty urinating, frequency and vaginal pain.  Musculoskeletal:  Positive for gait problem. Negative for back pain.  Skin:  Negative for pallor and rash.  Neurological:  Positive for dizziness and weakness. Negative for tremors, numbness and headaches.  Hematological:  Bruises/bleeds easily.  Psychiatric/Behavioral:  Negative for confusion and sleep disturbance. The patient is nervous/anxious.   Dry skin  Objective:  BP 129/83   Pulse (!) 109   Temp 98.2 F  (36.8 C) (Oral)   Ht 5' 3 (1.6 m)   Wt 151 lb (68.5 kg)   SpO2 99%   BMI 26.75 kg/m   BP Readings from Last 3 Encounters:  09/02/23 129/83  08/21/23 138/84  08/08/23 122/78    Wt Readings from Last 3 Encounters:  09/02/23 151 lb (68.5 kg)  08/28/23 145 lb (65.8 kg)  08/04/23 152 lb (68.9 kg)    Physical Exam Constitutional:      General: She is not in acute distress.    Appearance: Normal appearance. She is well-developed. She is not ill-appearing.  HENT:     Head: Normocephalic.     Right Ear: External ear normal.     Left Ear: External ear normal.     Nose: Nose normal.  Eyes:     General:        Right eye: No discharge.        Left eye: No discharge.     Conjunctiva/sclera: Conjunctivae normal.     Pupils: Pupils are equal, round, and reactive to light.  Neck:     Thyroid : No thyromegaly.     Vascular: No JVD.     Trachea: No tracheal deviation.  Cardiovascular:     Rate and Rhythm: Normal rate and regular rhythm.     Heart sounds: Normal heart sounds.  Pulmonary:     Effort: No respiratory distress.     Breath sounds: No stridor. No wheezing.  Abdominal:     General: Bowel sounds are normal. There is no distension.     Palpations: Abdomen is soft. There is no mass.     Tenderness: There is no abdominal tenderness. There is no guarding or rebound.  Musculoskeletal:        General: No tenderness.     Cervical back: Normal range of motion and neck supple. No rigidity.     Right lower leg: No edema.     Left lower leg: No edema.  Lymphadenopathy:     Cervical: No cervical adenopathy.  Skin:    Findings: No erythema or rash.  Neurological:     Mental Status: Mental status is at baseline.     Cranial Nerves: No cranial nerve deficit.     Motor: No abnormal muscle tone.     Coordination: Coordination abnormal.     Gait: Gait abnormal.     Deep Tendon Reflexes: Reflexes normal.  Psychiatric:        Behavior: Behavior normal.        Thought Content:  Thought content normal.        Judgment: Judgment normal.    Dry skin SKs Unsteady gait   Lab Results  Component Value Date   WBC 9.9 07/09/2023   HGB 9.8 (L) 07/09/2023   HCT 31.6 (L) 07/09/2023   PLT 392.0 07/09/2023   GLUCOSE 190 (H) 07/09/2023   CHOL 179 03/20/2023   TRIG  326.0 (H) 03/20/2023   HDL 46.20 03/20/2023   LDLDIRECT 125.0 06/05/2015   LDLCALC 67 03/20/2023   ALT 15 07/09/2023   AST 27 07/09/2023   NA 136 07/09/2023   K 3.9 07/09/2023   CL 105 07/09/2023   CREATININE 1.47 (H) 07/09/2023   BUN 21 07/09/2023   CO2 23 07/09/2023   TSH 3.53 03/20/2023   INR 1.1 08/16/2021   HGBA1C 6.2 06/20/2022    US  Abdomen Complete Result Date: 06/27/2023 CLINICAL DATA:  Abd pain, bloating. EXAM: ABDOMEN ULTRASOUND COMPLETE COMPARISON:  March 07, 2022 FINDINGS: Gallbladder: No gallstones. No wall thickening or pericholecystic fluid. No sonographic Murphy's sign noted by sonographer. Common bile duct: 2 mm Liver: Increased echogenicity. No focal lesion identified. No intrahepatic biliary ductal dilation. Portal vein is patent on color Doppler imaging with normal direction of blood flow towards the liver. IVC: No abnormality visualized. Pancreas: Visualized portion unremarkable. Spleen: Size and appearance within normal limits. Right Kidney: Length: 9.7 cm. Cortical thinning with mildly increased echogenicity. Exophytic upper pole cyst measuring 3.3 cm. No hydronephrosis or nephrolithiasis. Left Kidney: Length: 10.5 cm. Cortical thinning with Increased echogenicity. Lower pole cyst measuring 6.8 cm. No hydronephrosis or nephrolithiasis. Abdominal aorta: No aneurysm visualized. Other findings: None. IMPRESSION: 1. No acute sonographic abnormality on this complete abdominal ultrasound. 2. Renal parenchymal changes consistent with chronic medical renal disease. 3. Hepatic steatosis. Electronically Signed   By: Rogelia Myers M.D.   On: 06/27/2023 11:53    Assessment & Plan:    Problem List Items Addressed This Visit     Anxiety disorder - Primary   Worse Citalopram  10 mg/d re-start Xanax  prn      Relevant Medications   hydrOXYzine  (ATARAX ) 25 MG tablet   Ataxia   Chronic No recent falls      B12 deficiency   On B12      Chronic fatigue   Fall prevention was discussed      Essential hypertension   On Telmisartan  at lower dose      Hyperglycemia   Check CMET, A1c today Cutback on sweets, exercise!      Intrinsic eczema   Treated by Dr Norleen in 07/2023 w/steroids No rash  Use Hydroxyzine  prn Use Aquaphore Check A1c         Meds ordered this encounter  Medications   hydrOXYzine  (ATARAX ) 25 MG tablet    Sig: Take 1 tablet (25 mg total) by mouth at bedtime as needed for itching (insomnia).    Dispense:  30 tablet    Refill:  2      Follow-up: Return in about 3 months (around 12/03/2023) for a follow-up visit.  Marolyn Noel, MD

## 2023-09-02 NOTE — Assessment & Plan Note (Signed)
 Worse Citalopram  10 mg/d re-start Xanax  prn

## 2023-09-02 NOTE — Assessment & Plan Note (Signed)
 On B12

## 2023-09-02 NOTE — Assessment & Plan Note (Addendum)
 Treated by Dr Norleen in 07/2023 w/steroids No rash  Use Hydroxyzine  prn Use Aquaphore Check A1c

## 2023-09-02 NOTE — Assessment & Plan Note (Signed)
On Telmisartan at lower dose

## 2023-09-03 ENCOUNTER — Ambulatory Visit: Payer: Self-pay | Admitting: Internal Medicine

## 2023-09-03 DIAGNOSIS — E119 Type 2 diabetes mellitus without complications: Secondary | ICD-10-CM | POA: Insufficient documentation

## 2023-09-03 LAB — IRON,TIBC AND FERRITIN PANEL
%SAT: 8 % — ABNORMAL LOW (ref 16–45)
Ferritin: 5 ng/mL — ABNORMAL LOW (ref 16–288)
Iron: 37 ug/dL — ABNORMAL LOW (ref 45–160)
TIBC: 480 ug/dL — ABNORMAL HIGH (ref 250–450)

## 2023-09-03 MED ORDER — REPAGLINIDE 0.5 MG PO TABS: 0.5000 mg | ORAL_TABLET | Freq: Three times a day (TID) | ORAL | 11 refills | Status: DC

## 2023-09-03 NOTE — Addendum Note (Signed)
 Addended by: Marcela Alatorre V on: 09/03/2023 12:07 AM   Modules accepted: Orders, Level of Service

## 2023-09-03 NOTE — Assessment & Plan Note (Signed)
 New onset DM -labs/tests show diabetes.  This may be contributing in skin itching.  Prescription for  Prandin .  Take with meals.  Cut back on sweets and starches.

## 2023-09-03 NOTE — Assessment & Plan Note (Signed)
 New onset-labs/tests show diabetes.  This may be contributing in skin itching.  Prescription for  Prandin .  Take with meals.  Cut back on sweets and starches.

## 2023-09-04 ENCOUNTER — Encounter: Payer: Self-pay | Admitting: Gastroenterology

## 2023-09-04 ENCOUNTER — Telehealth: Payer: Self-pay | Admitting: Gastroenterology

## 2023-09-04 ENCOUNTER — Ambulatory Visit: Payer: Self-pay | Admitting: Gastroenterology

## 2023-09-04 ENCOUNTER — Ambulatory Visit (INDEPENDENT_AMBULATORY_CARE_PROVIDER_SITE_OTHER)
Admission: RE | Admit: 2023-09-04 | Discharge: 2023-09-04 | Disposition: A | Discharge status: Home / Self Care | Source: Ambulatory Visit | Attending: Gastroenterology

## 2023-09-04 ENCOUNTER — Ambulatory Visit: Admitting: Gastroenterology

## 2023-09-04 ENCOUNTER — Telehealth: Payer: Self-pay

## 2023-09-04 VITALS — BP 118/80 | HR 119 | Ht 64.0 in | Wt 150.0 lb

## 2023-09-04 DIAGNOSIS — R1031 Right lower quadrant pain: Secondary | ICD-10-CM | POA: Diagnosis not present

## 2023-09-04 DIAGNOSIS — R194 Change in bowel habit: Secondary | ICD-10-CM

## 2023-09-04 DIAGNOSIS — K625 Hemorrhage of anus and rectum: Secondary | ICD-10-CM

## 2023-09-04 DIAGNOSIS — K219 Gastro-esophageal reflux disease without esophagitis: Secondary | ICD-10-CM | POA: Diagnosis not present

## 2023-09-04 DIAGNOSIS — K224 Dyskinesia of esophagus: Secondary | ICD-10-CM

## 2023-09-04 DIAGNOSIS — D509 Iron deficiency anemia, unspecified: Secondary | ICD-10-CM

## 2023-09-04 DIAGNOSIS — R1310 Dysphagia, unspecified: Secondary | ICD-10-CM | POA: Diagnosis not present

## 2023-09-04 DIAGNOSIS — I878 Other specified disorders of veins: Secondary | ICD-10-CM | POA: Diagnosis not present

## 2023-09-04 MED ORDER — PANTOPRAZOLE SODIUM 40 MG PO TBEC: 40.0000 mg | DELAYED_RELEASE_TABLET | Freq: Every day | ORAL | 3 refills | Status: DC

## 2023-09-04 MED ORDER — POLYSACCHARIDE IRON COMPLEX 150 MG PO CAPS
150.0000 mg | ORAL_CAPSULE | Freq: Every day | ORAL | 5 refills | Status: DC
Start: 2023-09-04 — End: 2023-10-16

## 2023-09-04 NOTE — Patient Instructions (Addendum)
 Reflux Restart Pantoprazole  1 tablet po daily in am esophageal manometry on 10/08/23.    Constipation Recommend high fiber diet Fiber One cereal  Start Metamucil 1 tsp po daily If you go two days without bowel movement can take a dose of OTC Miralax   Iron  deficiency anemia Will schedule endoscopy  Start oral supplement  Your provider has requested that you have an abdominal x ray before leaving today. Please go to the basement floor to our Radiology department for the test.  You have been scheduled for an endoscopy. Please follow written instructions given to you at your visit today.  If you use inhalers (even only as needed), please bring them with you on the day of your procedure.  If you take any of the following medications, they will need to be adjusted prior to your procedure:   DO NOT TAKE 7 DAYS PRIOR TO TEST- Trulicity (dulaglutide) Ozempic, Wegovy (semaglutide) Mounjaro (tirzepatide) Bydureon Bcise (exanatide extended release)  DO NOT TAKE 1 DAY PRIOR TO YOUR TEST Rybelsus (semaglutide) Adlyxin (lixisenatide) Victoza (liraglutide) Byetta (exanatide) ___________________________________________________________________________  Due to recent changes in healthcare laws, you may see the results of your imaging and laboratory studies on MyChart before your provider has had a chance to review them.  We understand that in some cases there may be results that are confusing or concerning to you. Not all laboratory results come back in the same time frame and the provider may be waiting for multiple results in order to interpret others.  Please give us  48 hours in order for your provider to thoroughly review all the results before contacting the office for clarification of your results.  _______________________________________________________  If your blood pressure at your visit was 140/90 or greater, please contact your primary care physician to follow up on  this.  _______________________________________________________  If you are age 70 or older, your body mass index should be between 23-30. Your Body mass index is 25.75 kg/m. If this is out of the aforementioned range listed, please consider follow up with your Primary Care Provider.  If you are age 80 or younger, your body mass index should be between 19-25. Your Body mass index is 25.75 kg/m. If this is out of the aformentioned range listed, please consider follow up with your Primary Care Provider.   ________________________________________________________  The [facility] providers would like to encourage you to use MYCHART to communicate with providers for non-urgent requests or questions.  Due to long hold times on the telephone, sending your provider a message by [facility] may be a faster and more efficient way to get a response.  Please allow 48 business hours for a response.  Please remember that this is for non-urgent requests.  _______________________________________________________  [facility] is using a team-based approach to care.  Your team is made up of your doctor and two to three APPS. Our APPS (Nurse Practitioners and Physician Assistants) work with your physician to ensure care continuity for you. They are fully qualified to address your health concerns and develop a treatment plan. They communicate directly with your gastroenterologist to care for you. Seeing the Advanced Practice Practitioners on your physician's team can help you by facilitating care more promptly, often allowing for earlier appointments, access to diagnostic testing, procedures, and other specialty referrals.   Thank you for trusting me with your gastrointestinal care. Deanna May, NP-C

## 2023-09-04 NOTE — Progress Notes (Signed)
 Chief Complaint: follow-up  Primary GI Doctor: Dr. Federico  HPI: 83 year old female history of breast cancer s/p chemo and radiation, papillary thyroid  cancer s/p thyroidectomy, hypertension, hyperlipidemia  Last seen in GI office on 12/30/22 by [facility] for RLQ pain and dysphagia. Barium swallow ordered. EGD scheduled 01/31/24- patient did not stop Plavix , rescheduled for 03/05/23. Not done? Patient scheduled for esophageal manometry on 10/08/23.   Interval History     Patient presents for follow-up. Her main complaint today is of acute constipation.  Patient recently fell at her PCPs office and hit her knee on the curb.  Then patient has been in the knee brace and/or wheelchair.  Patient has pending appointment with orthopedist and tells me that they are going to be doing physical therapy.  Patient tells me is not a surgical issue.  Patient does admit since she hurt her knee her activity level has decreased as well as her eating habits have been poor.  Patient typically cooks at home but due to knee injury they have been eating food out a lot.  As a result patient reports she has had 2 episodes of constipation where she will go to days or more without bowel movement.  Patient states the first episode was about 2 months ago and she decided to use a Fleet enema without lubrication and when she inserted the enema she reports when she pulled it out she noted bright red blood.  She reports she had rectal pain that felt like she Willett Lefeber have tore something.  The second episode was last week she reports she decided to take a laxative.  She reports she was able to have a bowel movement but feels as if something is off.  She would like to have imaging today to rule out obstruction.  Patient denies nausea or vomiting.  Patient reports her appetite is good.  Patient has not had bleeding since the first episode couple months ago.    We also reviewed her history of dysphagia that was mentioned when she saw [facility] in  November.  At that time she completed swallow study that was abnormal with recommendations to follow-up with endoscopy and esophageal manometry.  Patient had schedule endoscopy in but unfortunately forgot to stop her Plavix .  Patient tells me today she continues with dysphagia with large pills.  She would like to go ahead and proceed to the endoscopy.  I also reviewed her meds and she used to be on pantoprazole  40 mg twice daily but tells me she no longer is taking.  She cannot recall why she stopped the medication.  She reports she has had to clear her throat a lot.  No pyrosis or regurgitation.   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)   HTN (hypertension)    Hyperlipidemia    Hypothyroidism    Personal history of chemotherapy 2002   Personal history of radiation therapy 2002   Psoriasis    S/P thyroidectomy 07/2005   2 cm largest diameter (1 other tiny focus)/ i-131 rx [facility] 08/2005   Thyroid   cancer [facility])    Papillary Stage 1 - Dr Kassie   Vitamin B12 deficiency 2009   Vitamin D  deficiency 2009    Past Surgical History:  Procedure Laterality Date   BREAST LUMPECTOMY     BREAST LUMPECTOMY WITH RADIOACTIVE SEED AND SENTINEL LYMPH NODE BIOPSY Left 09/11/2016   Procedure: LEFT BREAST LUMPECTOMY WITH RADIOACTIVE SEED AND LEFT AXILLARY SENTINEL LYMPH NODE BIOPSY WITH BLUE DYE INJECTION;  Surgeon: Gail Favorite, MD;  Location: [facility];  Service: General;  Laterality: Left;   IR FLUORO GUIDE PORT INSERTION RIGHT  09/13/2016   IR REMOVAL TUN ACCESS W/ PORT W/O FL MOD SED  12/30/2016   IR US  GUIDE VASC ACCESS RIGHT   09/13/2016   MASTECTOMY Right    Right   PORTACATH PLACEMENT N/A 09/11/2016   Procedure: ATTEMPTED INSERTION PORT-A-CATH WITH ULTRA SOUND GUIDANCE;  Surgeon: Gail Favorite, MD;  Location: [facility];  Service: General;  Laterality: N/A;   REDUCTION MAMMAPLASTY Left 2005   THYROIDECTOMY  2007    Current Outpatient Medications  Medication Sig Dispense Refill   ALPRAZolam  (XANAX ) 1 MG tablet Take 1 tablet (1 mg total) by mouth 3 (three) times daily as needed for anxiety. 90 tablet 2   clopidogrel  (PLAVIX ) 75 MG tablet TAKE 1 TABLET (75 MG TOTAL) BY MOUTH DAILY FOR 21 DAYS. 90 tablet 3   hydrOXYzine  (ATARAX ) 25 MG tablet Take 1 tablet (25 mg total) by mouth at bedtime as needed for itching (insomnia). 30 tablet 2   levothyroxine  (SYNTHROID ) 112 MCG tablet TAKE 1 TABLET BY MOUTH EVERY DAY 90 tablet 3   pantoprazole  (PROTONIX ) 40 MG tablet Take 1 tablet (40 mg total) by mouth daily. 90 tablet 3   rosuvastatin  (CRESTOR ) 20 MG tablet TAKE 1 TABLET BY MOUTH EVERY DAY 90 tablet 1   vitamin B-12 (CYANOCOBALAMIN ) 1000 MCG tablet Take 1,000 mcg by mouth daily.     anastrozole  (ARIMIDEX ) 1 MG tablet TAKE 1 TABLET BY MOUTH EVERY DAY 90 tablet 1   donepezil  (ARICEPT ) 5 MG tablet Take 1 tablet (5 mg total) by mouth at bedtime. (Patient not taking: Reported on 09/02/2023) 90 tablet 1   DULoxetine  (CYMBALTA ) 60 MG capsule TAKE ONE CAPSULE BY MOUTH EVERY DAY Oral; Duration: 30 (Patient not taking: Reported on 09/02/2023)     iron  polysaccharides (NIFEREX) 150 MG capsule Take 1 capsule (150 mg total) by mouth daily. 30 capsule 5   liothyronine  (CYTOMEL ) 25 MCG tablet Take 0.5 tablets (12.5 mcg total) by mouth daily. 45 tablet 3   meclizine  (ANTIVERT ) 12.5 MG tablet TAKE 1 TABLET BY MOUTH 3 TIMES DAILY AS NEEDED FOR DIZZINESS. 60 tablet 1   metoprolol  succinate (TOPROL -XL) 25 MG 24 hr tablet Take 12.5 mg by mouth at bedtime. (Patient not taking: Reported on 09/02/2023)     prednisoLONE acetate (PRED  FORTE) 1 % ophthalmic suspension PLEASE SEE ATTACHED FOR DETAILED DIRECTIONS     repaglinide  (PRANDIN ) 0.5 MG tablet Take 1 tablet (0.5 mg total) by mouth 3 (three) times daily before meals. 90 tablet 11   telmisartan  (MICARDIS ) 20 MG tablet Take 20 mg by mouth daily.     triamcinolone  cream (KENALOG ) 0.1 % Apply 1 Application topically 2 (two) times daily. 30 g 1   venlafaxine  XR (EFFEXOR -XR) 75 MG 24 hr capsule TAKE 1 CAPSULE (75 MG TOTAL) DAILY WITH BREAKFAST BY MOUTH. Oral; Duration: 30 (Patient not taking: Reported on 09/02/2023)     No current facility-administered medications for this  visit.   Facility-Administered Medications Ordered in Other Visits  Medication Dose Route Frequency Provider Last Rate Last Admin   denosumab  (PROLIA ) injection 60 mg  60 mg Subcutaneous Once Crawford Morna Pickle, NP        Allergies as of 09/04/2023 - Review Complete 09/04/2023  Allergen Reaction Noted   Pneumococcal vaccines Other (See Comments) 05/07/2016   Citalopram   07/15/2023   Sulfa antibiotics Other (See Comments) 09/05/2016   Sulfacetamide sodium-sulfur      Sulfamethoxazole-trimethoprim  03/19/2017   Tape Itching, Dermatitis, and Rash 09/05/2016   Tegaderm ag mesh [silver] Itching and Rash 09/18/2016    Family History  Problem Relation Age of Onset   Stroke Mother    Allergies Mother    Asthma Mother    Clotting disorder Mother    Heart disease Father 68       MI   Allergies Sister    Asthma Sister    Breast cancer Sister 78       Lymphoma 48; currently 9   Lymphoma Sister    Hyperparathyroidism Sister    Asthma Brother    Leukemia Brother        dx 12s   Allergies Daughter    Asthma Daughter    Allergies Sister    Breast cancer Sister 37       currently 22   Kidney cancer Paternal Aunt        kidney ca; deceased 81   Liver cancer Paternal Uncle        unk. primary (liver)   Throat cancer Maternal Grandmother        deceased 34   Lung cancer Maternal Grandfather         deceased 35   Pancreatic cancer Paternal Grandfather        deceased 40   Cancer Paternal Aunt        abdominal; deceased 61    Review of Systems:    Constitutional: No weight loss, fever, chills, weakness or fatigue HEENT: Eyes: No change in vision               Ears, Nose, Throat:  No change in hearing or congestion Skin: No rash or itching Cardiovascular: No chest pain, chest pressure or palpitations   Respiratory: No SOB or cough Gastrointestinal: See HPI and otherwise negative Genitourinary: No dysuria or change in urinary frequency Neurological: No headache, dizziness or syncope Musculoskeletal: No new muscle or joint pain Hematologic: No bleeding or bruising Psychiatric: No history of depression or anxiety    Physical Exam:  Vital signs: BP 118/80   Pulse (!) 119   Ht 5' 4 (1.626 m)   Wt 150 lb (68 kg)   BMI 25.75 kg/m   Constitutional:   Pleasant female appears to be in NAD, Well developed, Well nourished, alert and cooperative Throat: Oral cavity and pharynx without inflammation, swelling or lesion.  Respiratory: Respirations even and unlabored. Lungs clear to auscultation bilaterally.   No wheezes, crackles, or rhonchi.  Cardiovascular: Normal S1, S2. Regular rate and rhythm. No peripheral edema, cyanosis or pallor.  Gastrointestinal:  Soft, nondistended, nontender. No rebound or guarding. Normal bowel sounds. No appreciable masses or hepatomegaly. Rectal:  Not performed.  Msk:  Symmetrical without gross deformities. Without edema, no deformity or joint abnormality.  Neurologic:  Alert and  oriented x4;  grossly normal neurologically.  Skin:   Dry and intact without significant lesions or rashes.  RELEVANT LABS AND IMAGING: CBC    Latest Ref  Rng & Units 09/02/2023    4:19 PM 07/09/2023    3:38 PM 03/25/2023    3:17 PM  CBC  WBC 4.0 - 10.5 K/uL 5.9  9.9  7.5   Hemoglobin 12.0 - 15.0 g/dL 9.8  9.8  89.8   Hematocrit 36.0 - 46.0 % 32.2  31.6  34.5    Platelets 150.0 - 400.0 K/uL 316.0  392.0  384      CMP     Latest Ref Rng & Units 09/02/2023    4:19 PM 07/09/2023    3:38 PM 03/25/2023    3:17 PM  CMP  Glucose 70 - 99 mg/dL 852  809  841   BUN 6 - 23 mg/dL 25  21  22    Creatinine 0.40 - 1.20 mg/dL 8.61  8.52  8.43   Sodium 135 - 145 mEq/L 140  136  140   Potassium 3.5 - 5.1 mEq/L 3.9  3.9  4.3   Chloride 96 - 112 mEq/L 106  105  107   CO2 19 - 32 mEq/L 27  23  24    Calcium  8.4 - 10.5 mg/dL 9.5  9.6  89.8   Total Protein 6.0 - 8.3 g/dL 6.9  7.4  7.5   Total Bilirubin 0.2 - 1.2 mg/dL 0.6  0.5  0.4   Alkaline Phos 39 - 117 U/L 52  53  69   AST 0 - 37 U/L 18  27  21    ALT 0 - 35 U/L 17  15  15       Lab Results  Component Value Date   TSH 3.53 03/20/2023   PREVIOUS GI WORKUP    Colonoscopy 04/2018 for change in bowel habits - Stool in the entire examined colon. This limited a meaningful evaluation for both small and large polyps.  - The examination was otherwise normal on direct and retroflexion views.  - No specimens collected.  - No source recent symptoms identified on this examination.  01/03/23 ESOPHAGUS/BARIUM SWALLOW/TABLET STUDY  IMPRESSION: 1.  Patulous esophagus   2. Tertiary contractions, poor primary stripping wave contributing to severe esophageal dysmotility likely the cause of inability of 13mm tablet to pass through to the stomach.   3.  Moderate gastroesophageal reflux.  CT chest abdomen pelvis with contrast for unintentional weight loss January 2024 showed no findings of recurrent or metastatic disease.  Diverticulosis without diverticulitis.  Small liver cyst stable compared to CT scan in 2019.  Gallbladder unremarkable.   Assessment: Encounter Diagnoses  Name Primary?   Altered bowel habits Yes   Rectal bleeding    Iron  deficiency anemia, unspecified iron  deficiency anemia type    Esophageal dysmotility    Dysphagia, unspecified type    Gastroesophageal reflux disease, unspecified whether  esophagitis present      Patient presents with acute constipation most likely due to limited mobility and changes in dietary consumption.  Spent several minutes discussing high-fiber diet and adding fiber supplementation and over-the-counter MiraLAX as needed.  Will order abdominal x-ray to rule out any obstruction due to patient's concerns.  The rectal bleeding most likely was due to trauma from the Fleet enema and not using lubrication.  Recommend patient avoid using.  She no longer is having rectal pain or rectal bleeding.  Patient's recent lab work all shows shows iron  deficiency anemia with recommendations for patient to start oral iron  supplement.  Patient does not have MyChart access therefore I instructed her to pick up today.  I also notified  her that this Nieves Chapa increase her current issue with constipation.  If she does not tolerate we can consider IV iron  infusions.    Patient also has esophageal dysphagia.  Recent swallow study shows patulous esophagus gas and tertiary contractions indicating severe esophageal dysmotility and moderate GERD.  Will restart patient's pantoprazole  and she is already scheduled for esophageal manometry.  She would also like to reschedule endoscopy today to evaluate dysphagia as well as iron  deficiency anemia.  Plan: -Schedule EGD in [facility] with Dr. Federico. The risks and benefits of EGD with possible biopsies and esophageal dilation were discussed with the patient who agrees to proceed. -esophageal manometry scheduled 10/08/23. -Restart Pantoprazole  40 mg po daily  -Recommend high fiber diet  -Start Metamucil 1 tsp po daily -OTC Miralax po daily prn -Abd xray KUB 2 view  Thank you for the courtesy of this consult. Please call me with any questions or concerns.   Sheppard Luckenbach, FNP-C [facility] 09/04/2023, 4:37 PM  Cc: Plotnikov, Aleksei V, MD

## 2023-09-04 NOTE — Telephone Encounter (Signed)
 error

## 2023-09-04 NOTE — Telephone Encounter (Signed)
   [facility] [address], [place]  [address] Phone:  [phone]   Fax:  [phone]   [facility] Pre-operative Risk Assessment     Request for surgical clearance:     Endoscopy Procedure  What type of surgery is being performed?     Endoscopy  When is this surgery scheduled?     10/08/23  What type of clearance is required ?   Pharmacy  Are there any medications that need to be held prior to surgery and how long? Plavix  5 days  Practice name and name of physician performing surgery?      [facility]  What is your office phone and fax number?      Phone- [phone]  Fax- [phone]  Anesthesia type (None, local, MAC, general) ?       MAC   Please route your response to [facility]

## 2023-09-05 ENCOUNTER — Encounter (HOSPITAL_BASED_OUTPATIENT_CLINIC_OR_DEPARTMENT_OTHER): Payer: Self-pay | Admitting: Physician Assistant

## 2023-09-05 ENCOUNTER — Ambulatory Visit (HOSPITAL_BASED_OUTPATIENT_CLINIC_OR_DEPARTMENT_OTHER)

## 2023-09-05 ENCOUNTER — Ambulatory Visit (HOSPITAL_BASED_OUTPATIENT_CLINIC_OR_DEPARTMENT_OTHER): Admitting: Physician Assistant

## 2023-09-05 DIAGNOSIS — S82035A Nondisplaced transverse fracture of left patella, initial encounter for closed fracture: Secondary | ICD-10-CM | POA: Diagnosis not present

## 2023-09-05 DIAGNOSIS — S82035D Nondisplaced transverse fracture of left patella, subsequent encounter for closed fracture with routine healing: Secondary | ICD-10-CM | POA: Diagnosis not present

## 2023-09-05 NOTE — Progress Notes (Signed)
 Office Visit Note   Patient: [name] [name]           Date of Birth: 06/01/1940           MRN: [id number] Visit Date: 09/05/2023              Requested by: Garald Karlynn GAILS, MD [address] [place],  [place] [address] PCP: Garald Karlynn GAILS, MD  Chief Complaint  Patient presents with   Left Knee - Follow-up      HPI: Patient is approximately 7 weeks status post nondisplaced patella fracture of the left knee.  She is using a copper compression knee sleeve.  She finds it helpful.  Assessment & Plan: Visit Diagnoses:  1. Nondisplaced transverse fracture of left patella, initial encounter for closed fracture     Plan: X-rays do show interval healing.  She has excellent quadricep and patella strength.  Really no tenderness to palpation.  I talked to her about possibly doing some physical therapy just to ensure her balance is okay as sometimes she feels she is unloading too much on her other knee.  She is going to let us  know the physical therapy she would like to use close to her home and then we can place an order should follow-up for final time in 1 month  Follow-Up Instructions: Return in about 1 month (around 10/06/2023).   Ortho Exam  Patient is alert, oriented, no adenopathy, well-dressed, normal affect, normal respiratory effort. Examination of her knees she is neurovascular intact her compartments are soft and nontender no redness no effusion she has good strength to resisted extension and flexion of her leg easily sustains a straight leg raise    Imaging: DG Abd 2 Views Result Date: 09/04/2023 CLINICAL DATA:  Right lower quadrant abdominal pain, rectal bleeding. EXAM: ABDOMEN - 2 VIEW COMPARISON:  Jul 09, 2023. FINDINGS: The bowel gas pattern is normal. There is no evidence of free air. Phleboliths and surgical clips are noted in the pelvis. IMPRESSION: No abnormal bowel dilatation. Electronically Signed   By: Lynwood Landy Raddle M.D.   On: 09/04/2023 16:58   No  images are attached to the encounter.  Labs: Lab Results  Component Value Date   HGBA1C 7.8 (H) 09/02/2023   HGBA1C 6.2 06/20/2022   HGBA1C 6.4 06/18/2021   ESRSEDRATE 20 08/09/2020   CRP 0.5 08/09/2020   REPTSTATUS 08/12/2021 FINAL 08/10/2021   CULT  08/10/2021    NO GROWTH Performed at [facility], [address], [place], [place] [address]      Lab Results  Component Value Date   ALBUMIN 4.2 09/02/2023   ALBUMIN 4.3 07/09/2023   ALBUMIN 4.1 03/25/2023    No results found for: MG Lab Results  Component Value Date   VD25OH 48.51 03/20/2023   VD25OH 46.05 06/13/2020   VD25OH 46.45 04/15/2019    No results found for: PREALBUMIN    Latest Ref Rng & Units 09/02/2023    4:19 PM 07/09/2023    3:38 PM 03/25/2023    3:17 PM  CBC EXTENDED  WBC 4.0 - 10.5 K/uL 5.9  9.9  7.5   RBC 3.87 - 5.11 Mil/uL 4.34  4.21  4.16   Hemoglobin 12.0 - 15.0 g/dL 9.8  9.8  89.8   HCT 63.9 - 46.0 % 32.2  31.6  34.5   Platelets 150.0 - 400.0 K/uL 316.0  392.0  384   NEUT# 1.4 - 7.7 K/uL 3.4  6.9  4.8  Lymph# 0.7 - 4.0 K/uL 1.9  2.2  1.8      There is no height or weight on file to calculate BMI.  Orders:  Orders Placed This Encounter  Procedures   DG Knee Complete 4 Views Left   No orders of the defined types were placed in this encounter.    Procedures: No procedures performed  Clinical Data: No additional findings.  ROS:  All other systems negative, except as noted in the HPI. Review of Systems  Objective: Vital Signs: There were no vitals taken for this visit.  Specialty Comments:  No specialty comments available.  PMFS History: Patient Active Problem List   Diagnosis Date Noted   Diabetes mellitus type 2, noninsulin dependent (HCC) 09/03/2023   Rash 08/08/2023   Memory loss 07/21/2023   Rectal bleeding 07/12/2023   Intrinsic eczema 07/12/2023   Fall 07/12/2023   Acute pain of left knee 07/12/2023   Closed patellar sleeve fracture of left knee  07/12/2023   Abdominal pain 06/17/2023   Dysuria 12/30/2022   Bowel habit changes 12/18/2022   GERD (gastroesophageal reflux disease) 03/05/2022   Dysphagia 03/05/2022   Low back pain 12/04/2021   Low back problem 12/04/2021   Gait disorder 12/04/2021   Palpitations 02/19/2021   DOE (dyspnea on exertion) 11/16/2020   Chronic sinusitis 11/16/2020   Upper respiratory infection 09/28/2020   Retinal artery occlusion, branch, right 08/09/2020   History of TIA (transient ischemic attack) 08/09/2020   Hyperparathyroidism (HCC) 06/13/2020   Skin lesion 03/21/2020   Cerebrovascular disease 03/21/2020   Pruritus 03/14/2020   Ataxia 07/20/2019   Seroma of breast 07/20/2019   Cerumen impaction 03/23/2019   Wrist pain 03/23/2019   Right ear pain 06/27/2018   Coronary artery disease 01/20/2018   Breast cyst, left 09/17/2017   Aortic atherosclerosis (HCC) 07/28/2017   Osteoporosis 05/07/2017   Genetic testing 12/05/2016   Family history of breast cancer    Port catheter in place 10/23/2016   Encounter for antineoplastic chemotherapy 10/23/2016   Malignant neoplasm of upper-outer quadrant of left breast in female, estrogen receptor positive (HCC) 07/29/2016   Well adult exam 06/05/2015   Neoplasm of uncertain behavior of skin 06/05/2015   Adenopathy, cervical 06/05/2015   Sinus tachycardia 06/07/2014   Wart viral 10/18/2011   Hyperglycemia 11/06/2010   Actinic keratoses 05/25/2010   Essential hypertension 02/23/2010   SHOULDER PAIN 11/22/2009   Chronic fatigue 11/08/2009   RLQ PAIN 11/08/2009   VERTIGO 07/11/2009   ECZEMA 10/19/2008   CT, CHEST, ABNORMAL 05/30/2008   STYE 02/17/2008   B12 deficiency 12/16/2007   Vitamin D  deficiency 12/16/2007   HYPOTHYROIDISM, POSTSURGICAL 12/01/2007   Acute sinusitis 11/11/2007   ALLERGIC RESPIRATORY DISEASE, EXTRINSIC 11/11/2007   THYROID  CANCER 08/19/2007   Dyslipidemia 08/19/2007   Anxiety disorder 08/19/2007   Situational depression  08/19/2007   Allergic rhinitis 08/19/2007   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)   HTN (hypertension)    Hyperlipidemia    Hypothyroidism    Personal history of chemotherapy 2002   Personal history of radiation therapy 2002   Psoriasis    S/P thyroidectomy 07/2005   2 cm largest diameter (1 other tiny focus)/ i-131 rx [facility]  08/2005   Thyroid  cancer (HCC)    Papillary Stage 1 - Dr Kassie   Vitamin B12 deficiency 2009   Vitamin D  deficiency 2009    Family History  Problem Relation Age of Onset   Stroke Mother    Allergies Mother    Asthma Mother    Clotting disorder Mother    Heart disease Father 35       MI   Allergies Sister    Asthma Sister    Breast cancer Sister 51       Lymphoma 63; currently 78   Lymphoma Sister    Hyperparathyroidism Sister    Asthma Brother    Leukemia Brother        dx 53s   Allergies Daughter    Asthma Daughter    Allergies Sister    Breast cancer Sister 13       currently 79   Kidney cancer Paternal Aunt        kidney ca; deceased 22   Liver cancer Paternal Uncle        unk. primary (liver)   Throat cancer Maternal Grandmother        deceased 51   Lung cancer Maternal Grandfather        deceased 35   Pancreatic cancer Paternal Grandfather        deceased 28   Cancer Paternal Aunt        abdominal; deceased 36    Past Surgical History:  Procedure Laterality Date   BREAST LUMPECTOMY     BREAST LUMPECTOMY WITH RADIOACTIVE SEED AND SENTINEL LYMPH NODE BIOPSY Left 09/11/2016   Procedure: LEFT BREAST LUMPECTOMY WITH RADIOACTIVE SEED AND LEFT AXILLARY SENTINEL LYMPH NODE BIOPSY WITH BLUE DYE INJECTION;  Surgeon: Gail Favorite, MD;  Location: [facility];  Service: General;   Laterality: Left;   IR FLUORO GUIDE PORT INSERTION RIGHT  09/13/2016   IR REMOVAL TUN ACCESS W/ PORT W/O FL MOD SED  12/30/2016   IR US  GUIDE VASC ACCESS RIGHT  09/13/2016   MASTECTOMY Right    Right   PORTACATH PLACEMENT N/A 09/11/2016   Procedure: ATTEMPTED INSERTION PORT-A-CATH WITH ULTRA SOUND GUIDANCE;  Surgeon: Gail Favorite, MD;  Location: [facility];  Service: General;  Laterality: N/A;   REDUCTION MAMMAPLASTY Left 2005   THYROIDECTOMY  2007   Social History   Occupational History   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

## 2023-09-08 ENCOUNTER — Other Ambulatory Visit: Payer: Self-pay | Admitting: Adult Health

## 2023-09-08 ENCOUNTER — Ambulatory Visit: Payer: Self-pay

## 2023-09-08 NOTE — Telephone Encounter (Signed)
 Pearle is okay for the endoscopy.  Use your routine protocols for medication management (okay to hold Plavix  for 5 days).  Thanks

## 2023-09-08 NOTE — Telephone Encounter (Signed)
 FYI Only or Action Required?: FYI only for provider.  Patient was last seen in primary care on 09/02/2023 by Plotnikov, Karlynn GAILS, MD.  Called Nurse Triage reporting Advice Only.  Triage Disposition: Information or Advice Only Call  Patient/caregiver understands and will follow disposition?: Yes  Patient needs a refill of her blood thinner medication, ran out and accidentally threw away the bottle. She is unsure what the name is.  Reason for Disposition  [1] Other NON-URGENT information for PCP AND [2] does not require PCP response  Answer Assessment - Initial Assessment Questions 1. REASON FOR CALL or QUESTION: What is your reason for calling today? or How can I best     Patient calling stating she mixed together her plavix  and synthroid  medication. Patient is concerned with taking the wrong medication. Patient is instructed to speak with her pharmacist to get some assistance. Patient verbalized understanding and states she will be going to the pharmacy to straighten out her medications.   2. CALLER: Document the source of call. (e.g., laboratory staff, caregiver or patient).     patient  Protocols used: PCP Call - No Triage-A-AH

## 2023-09-08 NOTE — Progress Notes (Signed)
 I agree with the assessment and plan as outlined by Ms. May.

## 2023-09-09 ENCOUNTER — Other Ambulatory Visit: Payer: Self-pay

## 2023-09-09 ENCOUNTER — Telehealth: Payer: Self-pay

## 2023-09-09 MED ORDER — CLOPIDOGREL BISULFATE 75 MG PO TABS
75.0000 mg | ORAL_TABLET | Freq: Every day | ORAL | 3 refills | Status: AC
Start: 2023-09-09 — End: ?

## 2023-09-09 NOTE — Telephone Encounter (Signed)
 Pharmacy clearance

## 2023-09-09 NOTE — Telephone Encounter (Signed)
-----   Message from Cathryne PARAS May sent at 09/04/2023  5:29 PM EDT ----- Karna, please call the patient and let her know that there is no bowel obstruction or blockage.  Cathryne, NP ----- Message ----- From: Interface, Rad Results In Sent: 09/04/2023   5:00 PM EDT To: Cathryne PARAS May, NP

## 2023-09-09 NOTE — Telephone Encounter (Signed)
 Rx refill for pts blood thinner has been sent in to pts pharmacy.

## 2023-09-17 ENCOUNTER — Telehealth: Payer: Self-pay | Admitting: Physician Assistant

## 2023-09-17 NOTE — Telephone Encounter (Signed)
 Pt called requesting a referral for physical therapy. Pt asking for referral to go to [facility] and there phone number is [phone]. Pt asking for this to be sent right away.

## 2023-09-18 ENCOUNTER — Other Ambulatory Visit: Payer: Self-pay

## 2023-09-18 ENCOUNTER — Other Ambulatory Visit (HOSPITAL_BASED_OUTPATIENT_CLINIC_OR_DEPARTMENT_OTHER): Payer: Self-pay

## 2023-09-18 DIAGNOSIS — Z170 Estrogen receptor positive status [ER+]: Secondary | ICD-10-CM

## 2023-09-18 DIAGNOSIS — S82035A Nondisplaced transverse fracture of left patella, initial encounter for closed fracture: Secondary | ICD-10-CM

## 2023-09-18 NOTE — Telephone Encounter (Signed)
 Put PT referral for left knee.

## 2023-09-19 ENCOUNTER — Inpatient Hospital Stay: Payer: Medicare HMO

## 2023-09-19 ENCOUNTER — Inpatient Hospital Stay: Payer: Medicare HMO | Admitting: Adult Health

## 2023-09-24 ENCOUNTER — Other Ambulatory Visit: Payer: Self-pay | Admitting: Internal Medicine

## 2023-09-29 ENCOUNTER — Inpatient Hospital Stay

## 2023-09-29 ENCOUNTER — Inpatient Hospital Stay: Attending: Adult Health

## 2023-09-29 ENCOUNTER — Inpatient Hospital Stay (HOSPITAL_BASED_OUTPATIENT_CLINIC_OR_DEPARTMENT_OTHER): Admitting: Hematology and Oncology

## 2023-09-29 VITALS — BP 127/75 | HR 66 | Temp 97.5°F | Resp 16 | Ht 64.0 in | Wt 151.1 lb

## 2023-09-29 DIAGNOSIS — Z170 Estrogen receptor positive status [ER+]: Secondary | ICD-10-CM

## 2023-09-29 DIAGNOSIS — I6782 Cerebral ischemia: Secondary | ICD-10-CM | POA: Insufficient documentation

## 2023-09-29 DIAGNOSIS — C50412 Malignant neoplasm of upper-outer quadrant of left female breast: Secondary | ICD-10-CM

## 2023-09-29 DIAGNOSIS — M810 Age-related osteoporosis without current pathological fracture: Secondary | ICD-10-CM | POA: Diagnosis not present

## 2023-09-29 DIAGNOSIS — Z7902 Long term (current) use of antithrombotics/antiplatelets: Secondary | ICD-10-CM | POA: Insufficient documentation

## 2023-09-29 DIAGNOSIS — Z9011 Acquired absence of right breast and nipple: Secondary | ICD-10-CM | POA: Insufficient documentation

## 2023-09-29 DIAGNOSIS — Z79811 Long term (current) use of aromatase inhibitors: Secondary | ICD-10-CM | POA: Diagnosis not present

## 2023-09-29 DIAGNOSIS — D631 Anemia in chronic kidney disease: Secondary | ICD-10-CM | POA: Insufficient documentation

## 2023-09-29 DIAGNOSIS — Z95828 Presence of other vascular implants and grafts: Secondary | ICD-10-CM

## 2023-09-29 DIAGNOSIS — Z8051 Family history of malignant neoplasm of kidney: Secondary | ICD-10-CM | POA: Diagnosis not present

## 2023-09-29 DIAGNOSIS — Z853 Personal history of malignant neoplasm of breast: Secondary | ICD-10-CM | POA: Diagnosis not present

## 2023-09-29 DIAGNOSIS — N189 Chronic kidney disease, unspecified: Secondary | ICD-10-CM | POA: Diagnosis not present

## 2023-09-29 DIAGNOSIS — Z85850 Personal history of malignant neoplasm of thyroid: Secondary | ICD-10-CM | POA: Diagnosis not present

## 2023-09-29 DIAGNOSIS — E1122 Type 2 diabetes mellitus with diabetic chronic kidney disease: Secondary | ICD-10-CM | POA: Insufficient documentation

## 2023-09-29 LAB — CMP (CANCER CENTER ONLY)
ALT: 15 U/L (ref 0–44)
AST: 20 U/L (ref 15–41)
Albumin: 4 g/dL (ref 3.5–5.0)
Alkaline Phosphatase: 51 U/L (ref 38–126)
Anion gap: 9 (ref 5–15)
BUN: 20 mg/dL (ref 8–23)
CO2: 25 mmol/L (ref 22–32)
Calcium: 10.4 mg/dL — ABNORMAL HIGH (ref 8.9–10.3)
Chloride: 107 mmol/L (ref 98–111)
Creatinine: 1.37 mg/dL — ABNORMAL HIGH (ref 0.44–1.00)
GFR, Estimated: 38 mL/min — ABNORMAL LOW (ref >60)
Glucose, Bld: 146 mg/dL — ABNORMAL HIGH (ref 70–99)
Potassium: 4.4 mmol/L (ref 3.5–5.1)
Sodium: 141 mmol/L (ref 135–145)
Total Bilirubin: 0.6 mg/dL (ref 0.0–1.2)
Total Protein: 6.7 g/dL (ref 6.5–8.1)

## 2023-09-29 LAB — CBC WITH DIFFERENTIAL (CANCER CENTER ONLY)
Abs Immature Granulocytes: 0.02 K/uL (ref 0.00–0.07)
Basophils Absolute: 0 K/uL (ref 0.0–0.1)
Basophils Relative: 1 %
Eosinophils Absolute: 0.2 K/uL (ref 0.0–0.5)
Eosinophils Relative: 2 %
HCT: 35.2 % — ABNORMAL LOW (ref 36.0–46.0)
Hemoglobin: 10.4 g/dL — ABNORMAL LOW (ref 12.0–15.0)
Immature Granulocytes: 0 %
Lymphocytes Relative: 47 %
Lymphs Abs: 3.1 K/uL (ref 0.7–4.0)
MCH: 24 pg — ABNORMAL LOW (ref 26.0–34.0)
MCHC: 29.5 g/dL — ABNORMAL LOW (ref 30.0–36.0)
MCV: 81.3 fL (ref 80.0–100.0)
Monocytes Absolute: 0.5 K/uL (ref 0.1–1.0)
Monocytes Relative: 8 %
Neutro Abs: 2.8 K/uL (ref 1.7–7.7)
Neutrophils Relative %: 42 %
Platelet Count: 343 K/uL (ref 150–400)
RBC: 4.33 MIL/uL (ref 3.87–5.11)
RDW: 20.8 % — ABNORMAL HIGH (ref 11.5–15.5)
WBC Count: 6.6 K/uL (ref 4.0–10.5)
nRBC: 0 % (ref 0.0–0.2)

## 2023-09-29 MED ORDER — DENOSUMAB 60 MG/ML ~~LOC~~ SOSY
60.0000 mg | PREFILLED_SYRINGE | Freq: Once | SUBCUTANEOUS | Status: AC
Start: 2023-09-29 — End: 2023-09-29
  Administered 2023-09-29: 60 mg via SUBCUTANEOUS
  Filled 2023-09-29: qty 1

## 2023-09-29 NOTE — Assessment & Plan Note (Signed)
 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 

## 2023-09-29 NOTE — Progress Notes (Signed)
 Patient Care Team: Plotnikov, Karlynn GAILS, MD as PCP - General Causey, Morna Pickle, NP as Nurse Practitioner (Hematology and Oncology) Jeffrie Oneil BROCKS, MD as Consulting Physician (Cardiology) Russell Vina BRAVO, MD as Consulting Physician (Optometry)  DIAGNOSIS:  Encounter Diagnosis  Name Primary?   Malignant neoplasm of upper-outer quadrant of left breast in female, estrogen receptor positive (HCC) Yes      CHIEF COMPLIANT: Surveillance of breast cancer  HISTORY OF PRESENT ILLNESS:   History of Present Illness [name] [name] is an 83 year old female who presents for evaluation of mild anemia and medication management.  Her hemoglobin level is 10.4 and has remained at this level for most of the past year. She is currently on Plavix , which was initiated during a hospitalization for a suspected stroke two to three years ago. Her medication regimen also includes Niferex, Synthroid , Toprol , Prandin , Crestor , and Kenalog . She uses eye drops for macular degeneration and Gold Bond for eczema.  She has a history of thyroid  surgeries in 2007 and 2018, a mastectomy, and a lumpectomy. A facial lesion was also removed, though details are not provided.  Her family history includes a sister who underwent surgery for a hernia and thyroid  nodules, and another sister who died from kidney cancer. She expresses concern about her family's health and the stress it causes her.     ALLERGIES:  is allergic to pneumococcal vaccines, citalopram , sulfa antibiotics, sulfacetamide sodium-sulfur , sulfamethoxazole-trimethoprim, tape, and tegaderm ag mesh [silver].  MEDICATIONS:  Current Outpatient Medications  Medication Sig Dispense Refill   ALPRAZolam  (XANAX ) 1 MG tablet Take 1 tablet (1 mg total) by mouth 3 (three) times daily as needed for anxiety. 90 tablet 2   anastrozole  (ARIMIDEX ) 1 MG tablet TAKE 1 TABLET BY MOUTH EVERY DAY 90 tablet 1   clopidogrel  (PLAVIX ) 75 MG tablet Take 1 tablet (75 mg total)  by mouth daily. 90 tablet 3   donepezil  (ARICEPT ) 5 MG tablet Take 1 tablet (5 mg total) by mouth at bedtime. (Patient not taking: Reported on 09/02/2023) 90 tablet 1   iron  polysaccharides (NIFEREX) 150 MG capsule Take 1 capsule (150 mg total) by mouth daily. 30 capsule 5   levothyroxine  (SYNTHROID ) 112 MCG tablet TAKE 1 TABLET BY MOUTH EVERY DAY 90 tablet 3   metoprolol  succinate (TOPROL -XL) 25 MG 24 hr tablet Take 12.5 mg by mouth at bedtime. (Patient not taking: Reported on 09/02/2023)     prednisoLONE acetate (PRED FORTE) 1 % ophthalmic suspension PLEASE SEE ATTACHED FOR DETAILED DIRECTIONS     repaglinide  (PRANDIN ) 0.5 MG tablet Take 1 tablet (0.5 mg total) by mouth 3 (three) times daily before meals. 90 tablet 11   rosuvastatin  (CRESTOR ) 20 MG tablet TAKE 1 TABLET BY MOUTH EVERY DAY 90 tablet 1   venlafaxine  XR (EFFEXOR -XR) 75 MG 24 hr capsule TAKE 1 CAPSULE (75 MG TOTAL) DAILY WITH BREAKFAST BY MOUTH. Oral; Duration: 30 (Patient not taking: Reported on 09/02/2023)     vitamin B-12 (CYANOCOBALAMIN ) 1000 MCG tablet Take 1,000 mcg by mouth daily.     No current facility-administered medications for this visit.   Facility-Administered Medications Ordered in Other Visits  Medication Dose Route Frequency Provider Last Rate Last Admin   denosumab  (PROLIA ) injection 60 mg  60 mg Subcutaneous Once Causey, Lindsey Cornetto, NP        PHYSICAL EXAMINATION: ECOG PERFORMANCE STATUS: 1 - Symptomatic but completely ambulatory  Vitals:   09/29/23 1111  BP: 127/75  Pulse: 66  Resp: 16  Temp: (!) 97.5 F (36.4 C)  SpO2: 94%   Filed Weights   09/29/23 1111  Weight: 151 lb 1.6 oz (68.5 kg)    Physical Exam   (exam performed in the presence of a chaperone)  LABORATORY DATA:  I have reviewed the data as listed    Latest Ref Rng & Units 09/29/2023   10:49 AM 09/02/2023    4:19 PM 07/09/2023    3:38 PM  CMP  Glucose 70 - 99 mg/dL 853  852  809   BUN 8 - 23 mg/dL 20  25  21    Creatinine  0.44 - 1.00 mg/dL 8.62  8.61  8.52   Sodium 135 - 145 mmol/L 141  140  136   Potassium 3.5 - 5.1 mmol/L 4.4  3.9  3.9   Chloride 98 - 111 mmol/L 107  106  105   CO2 22 - 32 mmol/L 25  27  23    Calcium  8.9 - 10.3 mg/dL 89.5  9.5  9.6   Total Protein 6.5 - 8.1 g/dL 6.7  6.9  7.4   Total Bilirubin 0.0 - 1.2 mg/dL 0.6  0.6  0.5   Alkaline Phos 38 - 126 U/L 51  52  53   AST 15 - 41 U/L 20  18  27    ALT 0 - 44 U/L 15  17  15      Lab Results  Component Value Date   WBC 6.6 09/29/2023   HGB 10.4 (L) 09/29/2023   HCT 35.2 (L) 09/29/2023   MCV 81.3 09/29/2023   PLT 343 09/29/2023   NEUTROABS 2.8 09/29/2023    ASSESSMENT & PLAN:  Malignant neoplasm of upper-outer quadrant of left breast in female, estrogen receptor positive (HCC) 10/2000: Right lumpectomy: Multifocal triple negative breast cancer, treated with CMF and radiation 05/2002: Recurrence: Right mastectomy with TRAM reconstruction triple negative, Taxol Adriamycin x 4 07/2005: Papillary thyroid  cancer status post thyroidectomy and radioiodine 08/2016: Left breast: Grade 3 IDC stage I P ER weak positive Ki-67 80%, lumpectomy, carbo gem and radiation 12/13/2016: 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 Diabetes control essential for kidney and hemoglobin health. - Encourage better glycemic control. - Advise dietary modifications, including sugar-free options.  Chronic kidney disease Kidney function slightly below normal but improved. - Encourage increased water  intake.  Anemia secondary to chronic kidney disease Mild anemia with hemoglobin at 10.4 g/dL, likely due to chronic kidney disease. - Continue current management and monitor hemoglobin levels.  Osteoporosis Due for bone injection today. - Administer bone injection today. - Schedule next bone injection in six months.  History of breast cancer, status post right mastectomy and left lumpectomy Current mammogram normal. - Ensure annual mammogram is scheduled.  History of thyroid  cancer, status post thyroidectomy Currently taking Synthroid  for thyroid  management.  Chronic use of antiplatelet agent for microvascular ischemia Microvascular ischemia managed with Plavix . Recent fall isolated. - Continue Plavix  unless bleeding or frequent falls occur.  Status post left patellar fracture Healing well without surgery. Occasionally uses knee support. - Use knee support as needed.  Orders Placed This Encounter  Procedures   CBC with Differential (Cancer Center Only)    Standing Status:   Future    Expiration Date:   09/28/2024   CMP (Cancer Center only)    Standing Status:   Future    Expiration Date:   09/28/2024   The patient has a good understanding of the overall plan. she agrees with it. she will call with any problems that may develop before the next visit here. Total time spent: 30 mins including face to face time and time spent for planning, charting and co-ordination of care   Viinay K Aasiyah Auerbach, MD 09/29/23

## 2023-09-30 ENCOUNTER — Encounter: Payer: Self-pay | Admitting: Internal Medicine

## 2023-09-30 ENCOUNTER — Other Ambulatory Visit: Payer: Self-pay | Admitting: *Deleted

## 2023-09-30 DIAGNOSIS — C50412 Malignant neoplasm of upper-outer quadrant of left female breast: Secondary | ICD-10-CM

## 2023-10-02 ENCOUNTER — Ambulatory Visit (HOSPITAL_BASED_OUTPATIENT_CLINIC_OR_DEPARTMENT_OTHER): Admitting: Physician Assistant

## 2023-10-03 ENCOUNTER — Ambulatory Visit: Payer: Self-pay

## 2023-10-03 NOTE — Telephone Encounter (Signed)
 Patient would also like to discuss options for referrals for her arthritis and pruritus as well as to be seen for hip pain.   Patient requesting further guidance on dietary recommendations for diabetes. This RN provided pt with a list of low carb/low sugar foods as well as foods that are high in fiber. Advised to watch for constipation as she was recently started on iron .   FYI Only or Action Required?: Action required by provider: update on patient condition.  Patient was last seen in primary care on 09/02/2023 by Plotnikov, Karlynn GAILS, MD.  Called Nurse Triage reporting back pain.  Symptoms began several months ago.  Interventions attempted: OTC medications: icy hot.  Symptoms are: gradually worsening.  Triage Disposition: See PCP When Office is Open (Within 3 Days)  Patient/caregiver understands and will follow disposition?:   Reason for Disposition  [1] MODERATE back pain (e.g., interferes with normal activities) AND [2] present > 3 days  Answer Assessment - Initial Assessment Questions Patient looking for referral for back pain and generalized pruritus.  1. ONSET: When did the pain begin? (e.g., minutes, hours, days)     July 2025  2. LOCATION: Where does it hurt? (upper, mid or lower back)     Waist up  3. SEVERITY: How bad is the pain?  (e.g., Scale 1-10; mild, moderate, or severe)     Without brace, I am all bent over with 8-9/10 pain.  6. CAUSE:  What do you think is causing the back pain?      Osteoarthritis  8. MEDICINES: What have you taken so far for the pain? (e.g., nothing, acetaminophen , NSAIDS)     States she discontinued the prednisone  because it was causing her nausea  Protocols used: Back Pain-A-AH Copied from CRM #[id number]. Topic: Clinical - Red Word Triage >> Oct 03, 2023  3:02 PM Abigail D wrote: Red Word that prompted transfer to Nurse Triage: Pain: Patient said that her back has been killing her, she had a brace that she wears but she  stated that she is miserable, also experiencing pain in her ribs. She also is experiencing excema that has spread all over. She said she has osteoarthritis and doesn't know what to do.

## 2023-10-06 ENCOUNTER — Inpatient Hospital Stay

## 2023-10-06 ENCOUNTER — Ambulatory Visit: Payer: Self-pay

## 2023-10-06 ENCOUNTER — Inpatient Hospital Stay: Admitting: Adult Health

## 2023-10-06 ENCOUNTER — Ambulatory Visit: Admitting: Family Medicine

## 2023-10-06 NOTE — Progress Notes (Deleted)
   Acute Office Visit  Subjective:     Patient ID: [name] [name], female    DOB: 15-Jul-1940, 83 y.o.   MRN: [id number]  No chief complaint on file.   HPI  Discussed the use of AI scribe software for clinical note transcription with the patient, who gave verbal consent to proceed.  History of Present Illness      ROS Per HPI      Objective:    There were no vitals taken for this visit.   Physical Exam Vitals and nursing note reviewed.  Constitutional:      General: She is not in acute distress.    Appearance: Normal appearance. She is normal weight.  HENT:     Head: Normocephalic and atraumatic.     Right Ear: External ear normal.     Left Ear: External ear normal.     Nose: Nose normal.     Mouth/Throat:     Mouth: Mucous membranes are moist.     Pharynx: Oropharynx is clear.  Eyes:     Extraocular Movements: Extraocular movements intact.     Pupils: Pupils are equal, round, and reactive to light.  Cardiovascular:     Rate and Rhythm: Normal rate and regular rhythm.     Pulses: Normal pulses.     Heart sounds: Normal heart sounds.  Pulmonary:     Effort: Pulmonary effort is normal. No respiratory distress.     Breath sounds: Normal breath sounds. No wheezing, rhonchi or rales.  Musculoskeletal:        General: Normal range of motion.     Cervical back: Normal range of motion.     Right lower leg: No edema.     Left lower leg: No edema.  Lymphadenopathy:     Cervical: No cervical adenopathy.  Neurological:     General: No focal deficit present.     Mental Status: She is alert and oriented to person, place, and time.  Psychiatric:        Mood and Affect: Mood normal.        Thought Content: Thought content normal.     No results found for any visits on 10/06/23.      Assessment & Plan:   Assessment and Plan Assessment & Plan      No orders of the defined types were placed in this encounter.    No orders of the defined types were  placed in this encounter.   No follow-ups on file.  Corean LITTIE Ku, FNP

## 2023-10-06 NOTE — Telephone Encounter (Signed)
 No new symptoms - pt wanted to reschedule visit d/t her daughter testing positive for covid.  >> Oct 06, 2023 12:30 PM [place] J wrote: Patient is needing to reschedule her appointment to next week. The patient's daughter just tested positive for COVID and she is needing to quarantine.

## 2023-10-07 ENCOUNTER — Ambulatory Visit: Admitting: Family Medicine

## 2023-10-07 NOTE — Telephone Encounter (Signed)
 Upon pts call back to the clinic please have her schedule a OV as the provider has stated Please schedule office visit with any provider.  Thank you

## 2023-10-07 NOTE — Telephone Encounter (Signed)
 Please schedule office visit with any provider.  Thank you

## 2023-10-07 NOTE — Progress Notes (Deleted)
   Acute Office Visit  Subjective:     Patient ID: [name] [name], female    DOB: [birth year], 83 y.o.   MRN: [id number]  No chief complaint on file.   HPI  Discussed the use of AI scribe software for clinical note transcription with the patient, who gave verbal consent to proceed.  History of Present Illness      ROS Per HPI      Objective:    There were no vitals taken for this visit.   Physical Exam Vitals and nursing note reviewed.  Constitutional:      General: She is not in acute distress.    Appearance: Normal appearance. She is normal weight.  HENT:     Head: Normocephalic and atraumatic.     Right Ear: External ear normal.     Left Ear: External ear normal.     Nose: Nose normal.     Mouth/Throat:     Mouth: Mucous membranes are moist.     Pharynx: Oropharynx is clear.  Eyes:     Extraocular Movements: Extraocular movements intact.     Pupils: Pupils are equal, round, and reactive to light.  Cardiovascular:     Rate and Rhythm: Normal rate and regular rhythm.     Pulses: Normal pulses.     Heart sounds: Normal heart sounds.  Pulmonary:     Effort: Pulmonary effort is normal. No respiratory distress.     Breath sounds: Normal breath sounds. No wheezing, rhonchi or rales.  Musculoskeletal:        General: Normal range of motion.     Cervical back: Normal range of motion.     Right lower leg: No edema.     Left lower leg: No edema.  Lymphadenopathy:     Cervical: No cervical adenopathy.  Neurological:     General: No focal deficit present.     Mental Status: She is alert and oriented to person, place, and time.  Psychiatric:        Mood and Affect: Mood normal.        Thought Content: Thought content normal.     No results found for any visits on 10/07/23.      Assessment & Plan:   Assessment and Plan Assessment & Plan      No orders of the defined types were placed in this encounter.    No orders of the defined types were  placed in this encounter.   No follow-ups on file.  Corean LITTIE Ku, FNP

## 2023-10-08 ENCOUNTER — Ambulatory Visit (HOSPITAL_COMMUNITY)
Admission: RE | Admit: 2023-10-08 | Discharge: 2023-10-08 | Disposition: A | Discharge status: Home / Self Care | Payer: Medicare HMO | Attending: Internal Medicine | Admitting: Internal Medicine

## 2023-10-08 ENCOUNTER — Encounter: Admitting: Internal Medicine

## 2023-10-08 ENCOUNTER — Telehealth: Payer: Self-pay | Admitting: *Deleted

## 2023-10-08 ENCOUNTER — Encounter: Payer: Self-pay | Admitting: Oncology

## 2023-10-08 ENCOUNTER — Ambulatory Visit
Admission: RE | Admit: 2023-10-08 | Discharge: 2023-10-08 | Disposition: A | Discharge status: Home / Self Care | Source: Ambulatory Visit | Attending: Family Medicine | Admitting: Family Medicine

## 2023-10-08 ENCOUNTER — Encounter (HOSPITAL_COMMUNITY)
Admission: RE | Disposition: A | Discharge status: Home / Self Care | Payer: Self-pay | Source: Home / Self Care | Attending: Internal Medicine

## 2023-10-08 VITALS — BP 120/79 | HR 95 | Temp 98.2°F | Resp 18

## 2023-10-08 DIAGNOSIS — R1310 Dysphagia, unspecified: Secondary | ICD-10-CM

## 2023-10-08 DIAGNOSIS — L309 Dermatitis, unspecified: Secondary | ICD-10-CM

## 2023-10-08 DIAGNOSIS — K449 Diaphragmatic hernia without obstruction or gangrene: Secondary | ICD-10-CM

## 2023-10-08 DIAGNOSIS — K224 Dyskinesia of esophagus: Secondary | ICD-10-CM

## 2023-10-08 HISTORY — PX: ESOPHAGEAL MANOMETRY: SHX5429

## 2023-10-08 SURGERY — MANOMETRY, ESOPHAGUS

## 2023-10-08 MED ORDER — METHYLPREDNISOLONE ACETATE 40 MG/ML IJ SUSP: 40.0000 mg | Freq: Once | INTRAMUSCULAR | Status: DC

## 2023-10-08 MED ORDER — METHYLPREDNISOLONE ACETATE 80 MG/ML IJ SUSP
40.0000 mg | Freq: Once | INTRAMUSCULAR | Status: AC
  Administered 2023-10-08: 40 mg via INTRAMUSCULAR

## 2023-10-08 MED ORDER — TRIAMCINOLONE ACETONIDE 0.025 % EX OINT: 1.0000 | TOPICAL_OINTMENT | Freq: Two times a day (BID) | CUTANEOUS | 0 refills | Status: DC

## 2023-10-08 MED ORDER — LIDOCAINE VISCOUS HCL 2 % MT SOLN
OROMUCOSAL | Status: AC
  Filled 2023-10-08: qty 15

## 2023-10-08 SURGICAL SUPPLY — 2 items
FACESHIELD LNG OPTICON STERILE (SAFETY) IMPLANT
GLOVE BIO SURGEON STRL SZ8 (GLOVE) ×4 IMPLANT

## 2023-10-08 NOTE — Telephone Encounter (Signed)
 Called pt to ask if she would still like to come for EGD today at [facility]. She had a manometry of the esophagus this morning at [facility], but was also scheduled for and EGD at 1pm at [facility] today. Did not reach pt. Left a message for her to call back to reschedule EGD at her earliest convenience. While calling pt it was noted that per her chart, she had just checked in at an Urgent Care. Will make MD aware.

## 2023-10-08 NOTE — Telephone Encounter (Signed)
 Patient requesting f/u call in regards to previous note. Please advise.

## 2023-10-08 NOTE — Discharge Instructions (Addendum)
 You were given a steroid shot in the clinic to help with your eczema.  You may start the topical triamcinolone  steroid cream twice daily as needed to the rash areas.  I also advise use over-the-counter Aquaphor lotion for hydration of the areas.  Please follow-up with your PCP regarding your dermatology referral that was placed in June as well as follow-up with them if your symptoms are not improving.  Please go to the ER for any worsening symptoms.  Hope you feel better soon!

## 2023-10-08 NOTE — Progress Notes (Signed)
 Esophageal Manometry done per protocol. Patient tolerated well without distress or complication.

## 2023-10-08 NOTE — ED Triage Notes (Signed)
 Pt present with c/o possible eczema. States she has had a flare up since February and has used creams that were prescribed. States she has not had complete relief.

## 2023-10-08 NOTE — ED Provider Notes (Signed)
 [facility]    CSN: [id number] Arrival date & time: 10/08/23  1346      History   Chief Complaint Chief Complaint  Patient presents with   Rash    HPI [name] [name] is a 83 y.o. female presents for eczema.  Patient states she was recently diagnosed with eczema in February of this year.  States she has been having a flareup since then specifically on her hands arms.  States it is difficult for her to sleep due to the itching.  She was seen by her PCP twice in June once where she was referred to dermatology but patient has not heard anything regarding the referral.  The other time she was given a Depo-Medrol  injection which she states helped her itching.  The note refers to her using a prescription steroid cream but she states she does not know what that is and is instead been using over-the-counter eczema lotions with minimal improvement.  No other concerns at this time.   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)   HTN (hypertension)    Hyperlipidemia    Hypothyroidism    Personal history of chemotherapy 2002   Personal history of radiation therapy 2002   Psoriasis    S/P thyroidectomy 07/2005   2 cm largest diameter (1 other tiny focus)/ i-131 rx 99 mci 08/2005   Thyroid  cancer (HCC)    Papillary Stage 1 - Dr Kassie   Vitamin B12 deficiency 2009   Vitamin D  deficiency 2009    Patient Active Problem List   Diagnosis Date Noted   Diabetes mellitus type 2, noninsulin dependent (HCC) 09/03/2023   Rash 08/08/2023   Memory loss 07/21/2023   Rectal bleeding 07/12/2023   Intrinsic eczema 07/12/2023   Fall 07/12/2023   Acute pain of left knee 07/12/2023   Closed patellar sleeve fracture of left knee 07/12/2023    Abdominal pain 06/17/2023   Dysuria 12/30/2022   Bowel habit changes 12/18/2022   GERD (gastroesophageal reflux disease) 03/05/2022   Dysphagia 03/05/2022   Low back pain 12/04/2021   Low back problem 12/04/2021   Gait disorder 12/04/2021   Palpitations 02/19/2021   DOE (dyspnea on exertion) 11/16/2020   Chronic sinusitis 11/16/2020   Upper respiratory infection 09/28/2020   Retinal artery occlusion, branch, right 08/09/2020   History of TIA (transient ischemic attack) 08/09/2020   Hyperparathyroidism (HCC) 06/13/2020   Skin lesion 03/21/2020   Cerebrovascular disease 03/21/2020   Pruritus 03/14/2020   Ataxia 07/20/2019   Seroma of breast 07/20/2019   Cerumen impaction 03/23/2019   Wrist pain 03/23/2019   Right ear pain 06/27/2018   Coronary artery disease 01/20/2018   Breast cyst, left 09/17/2017   Aortic atherosclerosis (HCC) 07/28/2017   Osteoporosis 05/07/2017   Genetic testing 12/05/2016   Family history of breast cancer    Port catheter in place 10/23/2016   Encounter for antineoplastic chemotherapy 10/23/2016   Malignant neoplasm of upper-outer quadrant of left breast in female, estrogen receptor positive (HCC) 07/29/2016   Well adult exam 06/05/2015   Neoplasm of uncertain behavior of skin 06/05/2015   Adenopathy, cervical 06/05/2015   Sinus tachycardia 06/07/2014   Wart viral 10/18/2011   Hyperglycemia  11/06/2010   Actinic keratoses 05/25/2010   Essential hypertension 02/23/2010   SHOULDER PAIN 11/22/2009   Chronic fatigue 11/08/2009   RLQ PAIN 11/08/2009   VERTIGO 07/11/2009   ECZEMA 10/19/2008   CT, CHEST, ABNORMAL 05/30/2008   STYE 02/17/2008   B12 deficiency 12/16/2007   Vitamin D  deficiency 12/16/2007   HYPOTHYROIDISM, POSTSURGICAL 12/01/2007   Acute sinusitis 11/11/2007   ALLERGIC RESPIRATORY DISEASE, EXTRINSIC 11/11/2007   THYROID  CANCER 08/19/2007   Dyslipidemia 08/19/2007   Anxiety disorder 08/19/2007   Situational depression 08/19/2007    Allergic rhinitis 08/19/2007    Past Surgical History:  Procedure Laterality Date   BREAST LUMPECTOMY     BREAST LUMPECTOMY WITH RADIOACTIVE SEED AND SENTINEL LYMPH NODE BIOPSY Left 09/11/2016   Procedure: LEFT BREAST LUMPECTOMY WITH RADIOACTIVE SEED AND LEFT AXILLARY SENTINEL LYMPH NODE BIOPSY WITH BLUE DYE INJECTION;  Surgeon: Gail Favorite, MD;  Location: [facility];  Service: General;  Laterality: Left;   IR FLUORO GUIDE PORT INSERTION RIGHT  09/13/2016   IR REMOVAL TUN ACCESS W/ PORT W/O FL MOD SED  12/30/2016   IR US  GUIDE VASC ACCESS RIGHT  09/13/2016   MASTECTOMY Right    Right   PORTACATH PLACEMENT N/A 09/11/2016   Procedure: ATTEMPTED INSERTION PORT-A-CATH WITH ULTRA SOUND GUIDANCE;  Surgeon: Gail Favorite, MD;  Location: [facility];  Service: General;  Laterality: N/A;   REDUCTION MAMMAPLASTY Left 2005   THYROIDECTOMY  2007    OB History   No obstetric history on file.      Home Medications    Prior to Admission medications   Medication Sig Start Date End Date Taking? Authorizing Provider  triamcinolone  (KENALOG ) 0.025 % ointment Apply 1 Application topically 2 (two) times daily. 10/08/23  Yes Iyesha Such, Jodi R, NP  ALPRAZolam  (XANAX ) 1 MG tablet Take 1 tablet (1 mg total) by mouth 3 (three) times daily as needed for anxiety. 08/06/23   Plotnikov, Aleksei V, MD  anastrozole  (ARIMIDEX ) 1 MG tablet TAKE 1 TABLET BY MOUTH EVERY DAY 09/09/23   Causey, Lindsey Cornetto, NP  clopidogrel  (PLAVIX ) 75 MG tablet Take 1 tablet (75 mg total) by mouth daily. 09/09/23   Plotnikov, Karlynn GAILS, MD  donepezil  (ARICEPT ) 5 MG tablet Take 1 tablet (5 mg total) by mouth at bedtime. Patient not taking: Reported on 09/02/2023 07/21/23   Plotnikov, Karlynn GAILS, MD  iron  polysaccharides (NIFEREX) 150 MG capsule Take 1 capsule (150 mg total) by mouth daily. 09/04/23   Plotnikov, Aleksei V, MD  levothyroxine  (SYNTHROID ) 112 MCG tablet TAKE 1 TABLET BY MOUTH EVERY DAY 11/28/22    Plotnikov, Aleksei V, MD  metoprolol  succinate (TOPROL -XL) 25 MG 24 hr tablet Take 12.5 mg by mouth at bedtime. Patient not taking: Reported on 09/02/2023 06/03/23   [provider]  prednisoLONE acetate (PRED FORTE) 1 % ophthalmic suspension PLEASE SEE ATTACHED FOR DETAILED DIRECTIONS 04/15/23   [provider]  repaglinide  (PRANDIN ) 0.5 MG tablet Take 1 tablet (0.5 mg total) by mouth 3 (three) times daily before meals. 09/03/23   Plotnikov, Aleksei V, MD  rosuvastatin  (CRESTOR ) 20 MG tablet TAKE 1 TABLET BY MOUTH EVERY DAY 03/27/23   Plotnikov, Aleksei V, MD  venlafaxine  XR (EFFEXOR -XR) 75 MG 24 hr capsule TAKE 1 CAPSULE (75 MG TOTAL) DAILY WITH BREAKFAST BY MOUTH. Oral; Duration: 30 Patient not taking: Reported on 09/02/2023    [provider]  vitamin B-12 (CYANOCOBALAMIN ) 1000 MCG tablet Take 1,000 mcg by mouth daily.    [provider]    Family History Family History  Problem Relation Age of Onset   Stroke Mother    Allergies Mother    Asthma Mother    Clotting disorder Mother    Heart disease Father 8       MI   Allergies Sister    Asthma Sister    Breast cancer Sister 20       Lymphoma 68; currently 38   Lymphoma Sister    Hyperparathyroidism Sister    Asthma Brother    Leukemia Brother        dx 49s   Allergies Daughter    Asthma Daughter    Allergies Sister    Breast cancer Sister 32       currently 25   Kidney cancer Paternal Aunt        kidney ca; deceased 69   Liver cancer Paternal Uncle        unk. primary (liver)   Throat cancer Maternal Grandmother        deceased 60   Lung cancer Maternal Grandfather        deceased 102   Pancreatic cancer Paternal Grandfather        deceased 101   Cancer Paternal Aunt        abdominal; deceased 36    Social History Social History   Tobacco Use   Smoking status: Never   Smokeless tobacco: Never  Vaping Use   Vaping status: Never Used  Substance Use Topics   Alcohol use: No    Drug use: No     Allergies   Pneumococcal vaccines, Citalopram , Sulfa antibiotics, Sulfacetamide sodium-sulfur , Sulfamethoxazole-trimethoprim, Tape, and Tegaderm ag mesh [silver]   Review of Systems Review of Systems  Skin:  Positive for rash.     Physical Exam Triage Vital Signs ED Triage Vitals  Encounter Vitals Group     BP 10/08/23 1425 120/79     Girls Systolic BP Percentile --      Girls Diastolic BP Percentile --      Boys Systolic BP Percentile --      Boys Diastolic BP Percentile --      Pulse Rate 10/08/23 1425 95     Resp 10/08/23 1425 18     Temp 10/08/23 1425 98.2 F (36.8 C)     Temp Source 10/08/23 1425 Oral     SpO2 10/08/23 1425 96 %     Weight --      Height --      Head Circumference --      Peak Flow --      Pain Score 10/08/23 1424 0     Pain Loc --      Pain Education --      Exclude from Growth Chart --    No data found.  Updated Vital Signs BP 120/79 (BP Location: Right Arm)   Pulse 95   Temp 98.2 F (36.8 C) (Oral)   Resp 18   SpO2 96%   Visual Acuity Right Eye Distance:   Left Eye Distance:   Bilateral Distance:    Right Eye Near:   Left Eye Near:    Bilateral Near:     Physical Exam Vitals and nursing note reviewed.  Constitutional:      General: She is not in acute distress.    Appearance: Normal appearance. She is not ill-appearing.  HENT:     Head: Normocephalic and atraumatic.  Eyes:     Pupils: Pupils  are equal, round, and reactive to light.  Cardiovascular:     Rate and Rhythm: Normal rate.  Pulmonary:     Effort: Pulmonary effort is normal.  Skin:    General: Skin is warm and dry.     Comments: Scaly rash mild erythema on bilateral hands forearms  Neurological:     General: No focal deficit present.     Mental Status: She is alert and oriented to person, place, and time.  Psychiatric:        Mood and Affect: Mood normal.        Behavior: Behavior normal.      UC Treatments / Results  Labs (all labs  ordered are listed, but only abnormal results are displayed) Labs Reviewed - No data to display  EKG   Radiology No results found.  Procedures Procedures (including critical care time)  Medications Ordered in UC Medications  methylPREDNISolone  acetate (DEPO-MEDROL ) injection 40 mg (40 mg Intramuscular Given 10/08/23 1445)    Initial Impression / Assessment and Plan / UC Course  I have reviewed the triage vital signs and the nursing notes.  Pertinent labs & imaging results that were available during my care of the patient were reviewed by me and considered in my medical decision making (see chart for details).     Reviewed exam and symptoms with patient.  Patient states she has had good success with Depo-Medrol  injection previously, patient was given IM Depo-Medrol  in clinic.  She states she does not have a prescription steroid cream will do topical triamcinolone  twice daily as needed.  Advised her to follow-up with her PCP office regarding the dermatology referral placed back in June.  Advise over-the-counter Aquaphor lotion to areas for hydration.  ER precautions reviewed. Final Clinical Impressions(s) / UC Diagnoses   Final diagnoses:  Eczema, unspecified type     Discharge Instructions      You were given a steroid shot in the clinic to help with your eczema.  You may start the topical triamcinolone  steroid cream twice daily as needed to the rash areas.  I also advise use over-the-counter Aquaphor lotion for hydration of the areas.  Please follow-up with your PCP regarding your dermatology referral that was placed in June as well as follow-up with them if your symptoms are not improving.  Please go to the ER for any worsening symptoms.  Hope you feel better soon!     ED Prescriptions     Medication Sig Dispense Auth. Provider   triamcinolone  (KENALOG ) 0.025 % ointment Apply 1 Application topically 2 (two) times daily. 30 g Rosamond Andress, Jodi R, NP      PDMP not reviewed this  encounter.   Loreda Myla SAUNDERS, NP 10/08/23 [phone]

## 2023-10-09 ENCOUNTER — Other Ambulatory Visit: Payer: Self-pay

## 2023-10-09 DIAGNOSIS — K224 Dyskinesia of esophagus: Secondary | ICD-10-CM

## 2023-10-09 DIAGNOSIS — D509 Iron deficiency anemia, unspecified: Secondary | ICD-10-CM

## 2023-10-09 NOTE — Telephone Encounter (Signed)
 Dr Federico pt will route to POD B

## 2023-10-09 NOTE — Telephone Encounter (Signed)
 Patient has been rescheduled for 11/04/23 at 1:00 pm for EGD in the [facility] with Dr. Suzann. Amb ref placed & updated instructions sent to pt in mychart and mailed home. Also reminded her to hold blood thinner 5 days prior. Pt verbalized all understanding & has no further questions.

## 2023-10-09 NOTE — Telephone Encounter (Signed)
 Returned patient call & she did not come for her EGD yesterday since her manometry was scheduled for the same date. States this was not communicated to her. Apologized to patient for any miscommunication on our end. She would like to reschedule EGD, however Dr. Lafonda dates are full. Advised her I'd discuss with Deanna & be back in touch regarding rescheduling.

## 2023-10-10 ENCOUNTER — Ambulatory Visit: Payer: Self-pay

## 2023-10-10 ENCOUNTER — Encounter (HOSPITAL_COMMUNITY): Payer: Self-pay | Admitting: Internal Medicine

## 2023-10-10 ENCOUNTER — Ambulatory Visit: Admitting: Family Medicine

## 2023-10-10 NOTE — Telephone Encounter (Signed)
 Requesting referral to dermatologist for eczema. Was seen at [facility] 10/08/23 for same. Was given depo-medrol  and kenalog  ointment that she has been using. States depo-medrol  helped greatly but has worn off.   States she was given oral prednisone  in the past but did not tolerate it well. Scheduled for appt on 10/14/23 and advised to go to UC if worsens or if she would prefer to be seen sooner.  FYI Only or Action Required?: Action required by provider: referral request.  Patient was last seen in primary care on 09/02/2023 by Plotnikov, Karlynn GAILS, MD.  Called Nurse Triage reporting No chief complaint on file..  Symptoms began several weeks ago.  Interventions attempted: Prescription medications: depo medrol , kenalog .  Symptoms are: gradually worsening.  Triage Disposition: See PCP When Office is Open (Within 3 Days)  Patient/caregiver understands and will follow disposition?: Yes   Reason for Disposition  Localized rash present > 7 days  Answer Assessment - Initial Assessment Questions 1. APPEARANCE of RASH: What does the rash look like? (e.g., blisters, dry flaky skin, red spots, redness, sores)     Dry, scaly  2. LOCATION: Where is the rash located?      Arms and hands  3. ONSET: When did the rash start?      2 weeks  4. ITCHING: Does the rash itch? If Yes, ask: How bad is the itch?  (Scale 0-10; or none, mild, moderate, severe)     Severe, worse at night  Protocols used: Rash or Redness - Localized-A-AH Message from Anita S sent at 10/10/2023  3:09 PM EDT  Patient has eczema she states she is itching and redness. She does have medication for this and inquiring about dermatologist. The eczema is pretty much all over. No pain, just itching constantly at night

## 2023-10-14 ENCOUNTER — Encounter: Payer: Self-pay | Admitting: Oncology

## 2023-10-14 ENCOUNTER — Ambulatory Visit: Admitting: Family Medicine

## 2023-10-14 ENCOUNTER — Ambulatory Visit: Payer: Self-pay | Admitting: Internal Medicine

## 2023-10-14 ENCOUNTER — Ambulatory Visit

## 2023-10-14 NOTE — Progress Notes (Signed)
 Spoke to the patient about the results of her esophageal manometry test, which showed that she has distal esophageal spasm, which is an esophageal motility disorder where the esophagus contracts prematurely.  I told the patient that I would still like to see the results of her upcoming upper endoscopy to evaluate for any other sources of dysphagia.  For now, I recommended that the patient try to take 2 Altoid mints before each meal to see if this helps with her symptoms since peppermint can help relax smooth muscle.

## 2023-10-15 ENCOUNTER — Encounter: Payer: Self-pay | Admitting: Internal Medicine

## 2023-10-15 ENCOUNTER — Ambulatory Visit (INDEPENDENT_AMBULATORY_CARE_PROVIDER_SITE_OTHER): Admitting: Internal Medicine

## 2023-10-15 VITALS — BP 126/60 | HR 65 | Temp 97.8°F | Ht 64.0 in | Wt 151.0 lb

## 2023-10-15 DIAGNOSIS — Z7984 Long term (current) use of oral hypoglycemic drugs: Secondary | ICD-10-CM | POA: Diagnosis not present

## 2023-10-15 DIAGNOSIS — F419 Anxiety disorder, unspecified: Secondary | ICD-10-CM

## 2023-10-15 DIAGNOSIS — I10 Essential (primary) hypertension: Secondary | ICD-10-CM | POA: Diagnosis not present

## 2023-10-15 DIAGNOSIS — E538 Deficiency of other specified B group vitamins: Secondary | ICD-10-CM | POA: Diagnosis not present

## 2023-10-15 DIAGNOSIS — L2084 Intrinsic (allergic) eczema: Secondary | ICD-10-CM

## 2023-10-15 DIAGNOSIS — L299 Pruritus, unspecified: Secondary | ICD-10-CM

## 2023-10-15 DIAGNOSIS — L570 Actinic keratosis: Secondary | ICD-10-CM | POA: Diagnosis not present

## 2023-10-15 DIAGNOSIS — E119 Type 2 diabetes mellitus without complications: Secondary | ICD-10-CM

## 2023-10-15 DIAGNOSIS — H353221 Exudative age-related macular degeneration, left eye, with active choroidal neovascularization: Secondary | ICD-10-CM | POA: Diagnosis not present

## 2023-10-15 MED ORDER — METFORMIN HCL 500 MG PO TABS: 500.0000 mg | ORAL_TABLET | Freq: Every day | ORAL | 3 refills | Status: DC

## 2023-10-15 MED ORDER — LANCETS MISC. MISC: 1.0000 | Freq: Three times a day (TID) | 0 refills | Status: AC

## 2023-10-15 MED ORDER — CLOBETASOL PROPIONATE 0.05 % EX OINT: 1.0000 | TOPICAL_OINTMENT | Freq: Two times a day (BID) | CUTANEOUS | 1 refills | Status: AC

## 2023-10-15 MED ORDER — ESCITALOPRAM OXALATE 10 MG PO TABS: 10.0000 mg | ORAL_TABLET | Freq: Every day | ORAL | 5 refills | Status: DC

## 2023-10-15 MED ORDER — BLOOD GLUCOSE TEST VI STRP: 1.0000 | ORAL_STRIP | Freq: Three times a day (TID) | 0 refills | Status: AC

## 2023-10-15 MED ORDER — LANCET DEVICE MISC: 1.0000 | Freq: Three times a day (TID) | 0 refills | Status: AC

## 2023-10-15 MED ORDER — BLOOD GLUCOSE MONITORING SUPPL DEVI: 1.0000 | Freq: Three times a day (TID) | 0 refills | Status: AC

## 2023-10-15 NOTE — Assessment & Plan Note (Signed)
 Treat DM Derm ref to treat AKs - Dr Shona Clobetasol  oint prn

## 2023-10-15 NOTE — Assessment & Plan Note (Signed)
 Worse Start Lexapro  10 mg/d Xanax  prn  Potential benefits of a long term benzodiazepines  use as well as potential risks  and complications were explained to the patient and were aknowledged.

## 2023-10-15 NOTE — Progress Notes (Signed)
 Subjective:  Patient ID: [name] [name], female    DOB: 12/29/1940  Age: 83 y.o. MRN: [id number]  CC: Rash (Believe its eczema; came up on hands and arms; itching on knees and ankles and back of neck; have cream and lotion; tried to get in with Dermatologist but unsuccessful) and Medical Management of Chronic Issues (Want to get glucose checked today)   HPI [name] [name] presents for a new DM, LBP, anxiety C/o rash AKs on hands  Outpatient Medications Prior to Visit  Medication Sig Dispense Refill   ALPRAZolam  (XANAX ) 1 MG tablet Take 1 tablet (1 mg total) by mouth 3 (three) times daily as needed for anxiety. 90 tablet 2   anastrozole  (ARIMIDEX ) 1 MG tablet TAKE 1 TABLET BY MOUTH EVERY DAY 90 tablet 1   clopidogrel  (PLAVIX ) 75 MG tablet Take 1 tablet (75 mg total) by mouth daily. 90 tablet 3   levothyroxine  (SYNTHROID ) 112 MCG tablet TAKE 1 TABLET BY MOUTH EVERY DAY 90 tablet 3   prednisoLONE acetate (PRED FORTE) 1 % ophthalmic suspension PLEASE SEE ATTACHED FOR DETAILED DIRECTIONS     repaglinide  (PRANDIN ) 0.5 MG tablet Take 1 tablet (0.5 mg total) by mouth 3 (three) times daily before meals. 90 tablet 11   rosuvastatin  (CRESTOR ) 20 MG tablet TAKE 1 TABLET BY MOUTH EVERY DAY 90 tablet 1   triamcinolone  (KENALOG ) 0.025 % ointment Apply 1 Application topically 2 (two) times daily. 30 g 0   vitamin B-12 (CYANOCOBALAMIN ) 1000 MCG tablet Take 1,000 mcg by mouth daily.     donepezil  (ARICEPT ) 5 MG tablet Take 1 tablet (5 mg total) by mouth at bedtime. (Patient not taking: Reported on 10/15/2023) 90 tablet 1   iron  polysaccharides (NIFEREX) 150 MG capsule Take 1 capsule (150 mg total) by mouth daily. 30 capsule 5   metoprolol  succinate (TOPROL -XL) 25 MG 24 hr tablet Take 12.5 mg by mouth at bedtime. (Patient not taking: Reported on 10/15/2023)     venlafaxine  XR (EFFEXOR -XR) 75 MG 24 hr capsule TAKE 1 CAPSULE (75 MG TOTAL) DAILY WITH BREAKFAST BY MOUTH. Oral; Duration: 30 (Patient not taking:  Reported on 10/15/2023)     Facility-Administered Medications Prior to Visit  Medication Dose Route Frequency Provider Last Rate Last Admin   denosumab  (PROLIA ) injection 60 mg  60 mg Subcutaneous Once Causey, Lindsey Cornetto, NP        ROS: Review of Systems  Constitutional:  Positive for fatigue. Negative for activity change, appetite change, chills and unexpected weight change.  HENT:  Negative for congestion, mouth sores and sinus pressure.   Eyes:  Negative for visual disturbance.  Respiratory:  Negative for cough and chest tightness.   Gastrointestinal:  Negative for abdominal pain and nausea.  Genitourinary:  Negative for difficulty urinating, frequency and vaginal pain.  Musculoskeletal:  Positive for arthralgias, back pain, gait problem and neck stiffness.  Skin:  Positive for color change and rash. Negative for pallor.  Neurological:  Negative for dizziness, tremors, weakness, numbness and headaches.  Psychiatric/Behavioral:  Positive for dysphoric mood. Negative for confusion, sleep disturbance and suicidal ideas. The patient is nervous/anxious.     Objective:  BP 126/60   Pulse 65   Temp 97.8 F (36.6 C)   Ht 5' 4 (1.626 m)   Wt 151 lb (68.5 kg)   SpO2 97%   BMI 25.92 kg/m   BP Readings from Last 3 Encounters:  10/15/23 126/60  10/08/23 120/79  09/29/23 127/75    Wt Readings from  Last 3 Encounters:  10/15/23 151 lb (68.5 kg)  09/29/23 151 lb 1.6 oz (68.5 kg)  09/04/23 150 lb (68 kg)    Physical Exam Constitutional:      General: She is not in acute distress.    Appearance: She is well-developed. She is obese. She is not ill-appearing.  HENT:     Head: Normocephalic.     Right Ear: External ear normal.     Left Ear: External ear normal.     Nose: Nose normal.  Eyes:     General:        Right eye: No discharge.        Left eye: No discharge.     Conjunctiva/sclera: Conjunctivae normal.     Pupils: Pupils are equal, round, and reactive to light.   Neck:     Thyroid : No thyromegaly.     Vascular: No JVD.     Trachea: No tracheal deviation.  Cardiovascular:     Rate and Rhythm: Normal rate and regular rhythm.     Heart sounds: Normal heart sounds.  Pulmonary:     Effort: No respiratory distress.     Breath sounds: No stridor. No wheezing.  Abdominal:     General: Bowel sounds are normal. There is no distension.     Palpations: Abdomen is soft. There is no mass.     Tenderness: There is no abdominal tenderness. There is no guarding or rebound.  Musculoskeletal:        General: No tenderness.     Cervical back: Normal range of motion and neck supple. No rigidity.     Right lower leg: No edema.     Left lower leg: No edema.  Lymphadenopathy:     Cervical: No cervical adenopathy.  Skin:    Findings: Lesion and rash present. No erythema.  Neurological:     Mental Status: Mental status is at baseline.     Cranial Nerves: No cranial nerve deficit.     Motor: No abnormal muscle tone.     Coordination: Coordination abnormal.     Gait: Gait abnormal.     Deep Tendon Reflexes: Reflexes normal.  Psychiatric:        Behavior: Behavior normal.        Thought Content: Thought content normal.        Judgment: Judgment normal.   AKs Ataxic gait  Lab Results  Component Value Date   WBC 6.6 09/29/2023   HGB 10.4 (L) 09/29/2023   HCT 35.2 (L) 09/29/2023   PLT 343 09/29/2023   GLUCOSE 146 (H) 09/29/2023   CHOL 179 03/20/2023   TRIG 326.0 (H) 03/20/2023   HDL 46.20 03/20/2023   LDLDIRECT 125.0 06/05/2015   LDLCALC 67 03/20/2023   ALT 15 09/29/2023   AST 20 09/29/2023   NA 141 09/29/2023   K 4.4 09/29/2023   CL 107 09/29/2023   CREATININE 1.37 (H) 09/29/2023   BUN 20 09/29/2023   CO2 25 09/29/2023   TSH 3.53 03/20/2023   INR 1.1 08/16/2021   HGBA1C 7.8 (H) 09/02/2023    No results found.  Assessment & Plan:   Problem List Items Addressed This Visit     Actinic keratoses   [facility] ref Dr Shona       Relevant Orders   Ambulatory referral to Dermatology   Anxiety disorder   Worse Start Lexapro  10 mg/d Xanax  prn  Potential benefits of a long term benzodiazepines  use as well as potential risks  and complications were explained to the patient and were aknowledged.      Relevant Medications   escitalopram  (LEXAPRO ) 10 MG tablet   B12 deficiency   On B12      Diabetes mellitus type 2, noninsulin dependent (HCC) - Primary   On repaginate. Add Metformin  Shavanna is on a better diet Glucose meter Rx      Relevant Medications   metFORMIN  (GLUCOPHAGE ) 500 MG tablet   Essential hypertension   On Telmisartan  at lower dose      Intrinsic eczema   Clobetasol  oint prn      Pruritic condition   Treat DM Derm ref to treat AKs - Dr Shona Clobetasol  oint prn         Meds ordered this encounter  Medications   escitalopram  (LEXAPRO ) 10 MG tablet    Sig: Take 1 tablet (10 mg total) by mouth daily.    Dispense:  30 tablet    Refill:  5   clobetasol  ointment (TEMOVATE ) 0.05 %    Sig: Apply 1 Application topically 2 (two) times daily. rash    Dispense:  120 g    Refill:  1   metFORMIN  (GLUCOPHAGE ) 500 MG tablet    Sig: Take 1 tablet (500 mg total) by mouth daily with breakfast.    Dispense:  90 tablet    Refill:  3   Blood Glucose Monitoring Suppl DEVI    Sig: 1 each by Does not apply route in the morning, at noon, and at bedtime. May substitute to any manufacturer covered by patient's insurance.    Dispense:  1 each    Refill:  0   Glucose Blood (BLOOD GLUCOSE TEST STRIPS) STRP    Sig: 1 each by In Vitro route in the morning, at noon, and at bedtime. May substitute to any manufacturer covered by patient's insurance.    Dispense:  100 strip    Refill:  0   Lancet Device MISC    Sig: 1 each by Does not apply route in the morning, at noon, and at bedtime. May substitute to any manufacturer covered by patient's insurance.    Dispense:  1 each    Refill:  0   Lancets Misc. MISC     Sig: 1 each by Does not apply route in the morning, at noon, and at bedtime. May substitute to any manufacturer covered by patient's insurance.    Dispense:  100 each    Refill:  0      Follow-up: Return in about 6 weeks (around 11/26/2023) for a follow-up visit.  Marolyn Noel, MD

## 2023-10-15 NOTE — Assessment & Plan Note (Signed)
On Telmisartan at lower dose

## 2023-10-15 NOTE — Assessment & Plan Note (Signed)
 On B12

## 2023-10-15 NOTE — Assessment & Plan Note (Addendum)
 [facility] ref Dr Shona

## 2023-10-15 NOTE — Assessment & Plan Note (Signed)
 On repaginate. Add Metformin  Maybelline is on a better diet Glucose meter Rx

## 2023-10-15 NOTE — Assessment & Plan Note (Signed)
 Clobetasol  oint prn

## 2023-10-16 ENCOUNTER — Ambulatory Visit
Admission: EM | Admit: 2023-10-16 | Discharge: 2023-10-16 | Disposition: A | Discharge status: Home / Self Care | Attending: Physician Assistant | Admitting: Physician Assistant

## 2023-10-16 ENCOUNTER — Encounter (HOSPITAL_COMMUNITY): Payer: Self-pay

## 2023-10-16 ENCOUNTER — Emergency Department (HOSPITAL_COMMUNITY)
Admission: EM | Admit: 2023-10-16 | Discharge: 2023-10-16 | Disposition: A | Discharge status: Home / Self Care | Attending: Emergency Medicine | Admitting: Emergency Medicine

## 2023-10-16 ENCOUNTER — Ambulatory Visit: Payer: Self-pay

## 2023-10-16 ENCOUNTER — Emergency Department (HOSPITAL_COMMUNITY)

## 2023-10-16 DIAGNOSIS — Z7902 Long term (current) use of antithrombotics/antiplatelets: Secondary | ICD-10-CM | POA: Insufficient documentation

## 2023-10-16 DIAGNOSIS — I10 Essential (primary) hypertension: Secondary | ICD-10-CM | POA: Insufficient documentation

## 2023-10-16 DIAGNOSIS — I2510 Atherosclerotic heart disease of native coronary artery without angina pectoris: Secondary | ICD-10-CM | POA: Insufficient documentation

## 2023-10-16 DIAGNOSIS — E119 Type 2 diabetes mellitus without complications: Secondary | ICD-10-CM

## 2023-10-16 DIAGNOSIS — Z79890 Hormone replacement therapy (postmenopausal): Secondary | ICD-10-CM | POA: Insufficient documentation

## 2023-10-16 DIAGNOSIS — D649 Anemia, unspecified: Secondary | ICD-10-CM | POA: Insufficient documentation

## 2023-10-16 DIAGNOSIS — R519 Headache, unspecified: Secondary | ICD-10-CM | POA: Diagnosis not present

## 2023-10-16 DIAGNOSIS — I6782 Cerebral ischemia: Secondary | ICD-10-CM | POA: Diagnosis not present

## 2023-10-16 DIAGNOSIS — E039 Hypothyroidism, unspecified: Secondary | ICD-10-CM | POA: Insufficient documentation

## 2023-10-16 DIAGNOSIS — H539 Unspecified visual disturbance: Secondary | ICD-10-CM

## 2023-10-16 DIAGNOSIS — R7989 Other specified abnormal findings of blood chemistry: Secondary | ICD-10-CM | POA: Diagnosis not present

## 2023-10-16 DIAGNOSIS — H538 Other visual disturbances: Secondary | ICD-10-CM | POA: Insufficient documentation

## 2023-10-16 LAB — BASIC METABOLIC PANEL WITH GFR
Anion gap: 15 (ref 5–15)
BUN: 27 mg/dL — ABNORMAL HIGH (ref 8–23)
CO2: 19 mmol/L — ABNORMAL LOW (ref 22–32)
Calcium: 10.2 mg/dL (ref 8.9–10.3)
Chloride: 104 mmol/L (ref 98–111)
Creatinine, Ser: 1.27 mg/dL — ABNORMAL HIGH (ref 0.44–1.00)
GFR, Estimated: 41 mL/min — ABNORMAL LOW (ref >60)
Glucose, Bld: 114 mg/dL — ABNORMAL HIGH (ref 70–99)
Potassium: 4.1 mmol/L (ref 3.5–5.1)
Sodium: 138 mmol/L (ref 135–145)

## 2023-10-16 LAB — CBC
HCT: 38.3 % (ref 36.0–46.0)
Hemoglobin: 11.1 g/dL — ABNORMAL LOW (ref 12.0–15.0)
MCH: 23.8 pg — ABNORMAL LOW (ref 26.0–34.0)
MCHC: 29 g/dL — ABNORMAL LOW (ref 30.0–36.0)
MCV: 82.2 fL (ref 80.0–100.0)
Platelets: 364 K/uL (ref 150–400)
RBC: 4.66 MIL/uL (ref 3.87–5.11)
RDW: 21.9 % — ABNORMAL HIGH (ref 11.5–15.5)
WBC: 7.4 K/uL (ref 4.0–10.5)
nRBC: 0 % (ref 0.0–0.2)

## 2023-10-16 LAB — CBG MONITORING, ED: Glucose-Capillary: 112 mg/dL — ABNORMAL HIGH (ref 70–99)

## 2023-10-16 LAB — GLUCOSE, POCT (MANUAL RESULT ENTRY): POCT Glucose (KUC): 90 mg/dL (ref 70–99)

## 2023-10-16 MED ORDER — ACETAMINOPHEN 325 MG PO TABS
650.0000 mg | ORAL_TABLET | Freq: Once | ORAL | Status: AC
  Administered 2023-10-16: 650 mg via ORAL
  Filled 2023-10-16: qty 2

## 2023-10-16 MED ORDER — FLUORESCEIN SODIUM 1 MG OP STRP
1.0000 | ORAL_STRIP | Freq: Once | OPHTHALMIC | Status: DC
  Filled 2023-10-16: qty 1

## 2023-10-16 MED ORDER — TETRACAINE HCL 0.5 % OP SOLN
2.0000 [drp] | Freq: Once | OPHTHALMIC | Status: DC
  Filled 2023-10-16: qty 4

## 2023-10-16 NOTE — ED Triage Notes (Signed)
 Patient reports vision changes in left eye. Unable to read my badge clearly.

## 2023-10-16 NOTE — Telephone Encounter (Signed)
 FYI Only or Action Required?: FYI only for provider.  Patient was last seen in primary care on 10/15/2023 by Plotnikov, Karlynn GAILS, MD.  Called Nurse Triage reporting Hypertension and Eye Pain.  Symptoms began yesterday.  Interventions attempted: Other: received left eye injection at [facility] yesterday.  Symptoms are: hypertension (169/120), left eye pain (in and around eye, at injection site), light sensitivity and floaters in eyes after injection, back of neck pain , blurry vision in left eye gradually worsening.  Triage Disposition: Go to ED Now (Notify PCP)  Patient/caregiver understands and will follow disposition?: Yes             Copied from CRM #[id number]. Topic: Clinical - Red Word Triage >> Oct 16, 2023  3:14 PM [name] [name] wrote: Red Word that prompted transfer to Nurse Triage: elevated blood pressure 165/105,eye pain from injection in eye Reason for Disposition  [1] Systolic BP >= 160 OR Diastolic >= 100 AND [2] cardiac (e.g., breathing difficulty, chest pain) or neurologic symptoms (e.g., new-onset blurred or double vision, unsteady gait)  Answer Assessment - Initial Assessment Questions Patient was seen yesterday at [facility] (being treated for macular degeneration) and was given an injection (left eye). She states the injection was painful and she had floaters after the injection. She states she just got off the phone with their office and they told her they don't think this is related to her eye injection. They also told her they will pass the message along to her eye doctor and she might get a call back. She states she woke up last night with pain in and around her left eye.   1. BLOOD PRESSURE: What is your blood pressure? Did you take at least two measurements 5 minutes apart?     165/105 and 169/120.  2. ONSET: When did you take your blood pressure?     This afternoon. Just taken before and during triage.  3. HOW: How did you  take your blood pressure? (e.g., automatic home BP monitor, visiting nurse)     Automatic home BP monitor.  4. HISTORY: Do you have a history of high blood pressure?     Yes per chart. Patient denies any history of high blood pressure.  5. MEDICINES: Are you taking any medicines for blood pressure? Have you missed any doses recently?     No.  6. OTHER SYMPTOMS: Do you have any symptoms? (e.g., blurred vision, chest pain, difficulty breathing, headache, weakness)     Left eye pain (in and around the eye; moderate. Also painful at injection site), light sensitivity, back of neck pain describes it as can hear my bones crack and crunching, blurry vision in left eye. Denies chest pain or difficulty breathing.  7. PREGNANCY: Is there any chance you are pregnant? When was your last menstrual period?     N/A.  Protocols used: Blood Pressure - High-A-AH

## 2023-10-16 NOTE — Discharge Instructions (Signed)
 Your workup today was reassuring, I suspect that your headache was because of the pain related to the eye procedure.  Please follow-up closely with your primary care doctor

## 2023-10-16 NOTE — ED Triage Notes (Signed)
 Patient reports her heart is racing and her BP was high at home (200 something over 200 something), taken numerous times and was high. Recent appts with specialist and PCP. Eye injection done yesterday as well as starting new depression medication that could raise her HR. Reports being real anxious about taking that antidepressant. No chest pain. No sob.

## 2023-10-16 NOTE — ED Notes (Signed)
 Patient is being discharged from the Urgent Care and sent to the Emergency Department via Private Vehicle (Husband) . Per Provider, patient is in need of higher level of care due to Vision Changes (Left), Recent Procedure. Patient is aware and verbalizes understanding of plan of care.  Vitals:   10/16/23 1606 10/16/23 1642  BP: 118/84   Pulse: (!) 125 (!) 104  Resp: 20   Temp: (!) 97.2 F (36.2 C)   SpO2: 96%

## 2023-10-16 NOTE — ED Provider Notes (Signed)
 [place] EMERGENCY DEPARTMENT AT [facility] Provider Note   CSN: [id number] Arrival date & time: 10/16/23  1720     Patient presents with: Headache   [name] [name] is a 83 y.o. female with past medical history significant for hypothyroidism, anxiety, depression, hypertension, CAD who presents with concern for headache, eye pain, blurry vision.  She had some kind of injection for macular degeneration yesterday with [facility].  She does not know what kind of injection it was.  She rates the headache, eye pain 7/10.  She reports that she felt like her heart was racing earlier.  She took her blood pressure at home and reports the systolic was greater than 200.  She uses a wrist cuff.  She had normotensive blood pressure urgent care.  She also reports recent diagnosis of diabetes and has been worried about her sugar.  Sent from urgent care for further evaluation of eye pain.    Headache      Prior to Admission medications   Medication Sig Start Date End Date Taking? Authorizing Provider  ALPRAZolam  (XANAX ) 1 MG tablet Take 1 tablet (1 mg total) by mouth 3 (three) times daily as needed for anxiety. 08/06/23   Plotnikov, Aleksei V, MD  anastrozole  (ARIMIDEX ) 1 MG tablet TAKE 1 TABLET BY MOUTH EVERY DAY 09/09/23   Causey, Morna Pickle, NP  Blood Glucose Monitoring Suppl DEVI 1 each by Does not apply route in the morning, at noon, and at bedtime. May substitute to any manufacturer covered by patient's insurance. 10/15/23   Plotnikov, Aleksei V, MD  clobetasol  ointment (TEMOVATE ) 0.05 % Apply 1 Application topically 2 (two) times daily. rash 10/15/23   Plotnikov, Karlynn GAILS, MD  clopidogrel  (PLAVIX ) 75 MG tablet Take 1 tablet (75 mg total) by mouth daily. 09/09/23   Plotnikov, Karlynn GAILS, MD  donepezil  (ARICEPT ) 5 MG tablet Take 1 tablet (5 mg total) by mouth at bedtime. Patient not taking: Reported on 08/28/2023 07/21/23   Plotnikov, Karlynn GAILS, MD  escitalopram  (LEXAPRO ) 10 MG  tablet Take 1 tablet (10 mg total) by mouth daily. 10/15/23   Plotnikov, Aleksei V, MD  Glucose Blood (BLOOD GLUCOSE TEST STRIPS) STRP 1 each by In Vitro route in the morning, at noon, and at bedtime. May substitute to any manufacturer covered by patient's insurance. 10/15/23 11/14/23  Plotnikov, Aleksei V, MD  Lancet Device MISC 1 each by Does not apply route in the morning, at noon, and at bedtime. May substitute to any manufacturer covered by patient's insurance. 10/15/23 11/14/23  Plotnikov, Karlynn GAILS, MD  Lancets Misc. MISC 1 each by Does not apply route in the morning, at noon, and at bedtime. May substitute to any manufacturer covered by patient's insurance. 10/15/23 11/14/23  Plotnikov, Aleksei V, MD  levothyroxine  (SYNTHROID ) 112 MCG tablet TAKE 1 TABLET BY MOUTH EVERY DAY 11/28/22   Plotnikov, Aleksei V, MD  metFORMIN  (GLUCOPHAGE ) 500 MG tablet Take 1 tablet (500 mg total) by mouth daily with breakfast. 10/15/23   Plotnikov, Karlynn GAILS, MD  metoprolol  succinate (TOPROL -XL) 25 MG 24 hr tablet Take 12.5 mg by mouth at bedtime. Patient not taking: Reported on 10/15/2023 06/03/23   [provider]  prednisoLONE acetate (PRED FORTE) 1 % ophthalmic suspension PLEASE SEE ATTACHED FOR DETAILED DIRECTIONS 04/15/23   [provider]  repaglinide  (PRANDIN ) 0.5 MG tablet Take 1 tablet (0.5 mg total) by mouth 3 (three) times daily before meals. 09/03/23   Plotnikov, Aleksei V, MD  rosuvastatin  (CRESTOR ) 20  MG tablet TAKE 1 TABLET BY MOUTH EVERY DAY 03/27/23   Plotnikov, Aleksei V, MD  triamcinolone  (KENALOG ) 0.025 % ointment Apply 1 Application topically 2 (two) times daily. 10/08/23   Mayer, Jodi R, NP  vitamin B-12 (CYANOCOBALAMIN ) 1000 MCG tablet Take 1,000 mcg by mouth daily.    [provider]    Allergies: Pneumococcal vaccines, Citalopram , Sulfa antibiotics, Sulfacetamide sodium-sulfur , Sulfamethoxazole-trimethoprim, Tape, and Tegaderm ag mesh [silver]    Review of Systems  Neurological:   Positive for headaches.  All other systems reviewed and are negative.   Updated Vital Signs BP (!) 146/88 (BP Location: Left Arm)   Pulse 78   Temp 98.4 F (36.9 C) (Oral)   Resp 16   SpO2 100%   Physical Exam Vitals and nursing note reviewed.  Constitutional:      General: She is not in acute distress.    Appearance: Normal appearance.  HENT:     Head: Normocephalic and atraumatic.  Eyes:     General:        Right eye: No discharge.        Left eye: No discharge.     Comments: Mild conjunctival injection on left  Intraocular pressure 21 mmHg on the left.  Fluorescein  exam reveals no Sidel sign, no acute corneal abrasion or corneal ulcer.  Cardiovascular:     Rate and Rhythm: Normal rate and regular rhythm.     Heart sounds: No murmur heard.    No friction rub. No gallop.  Pulmonary:     Effort: Pulmonary effort is normal.     Breath sounds: Normal breath sounds.  Abdominal:     General: Bowel sounds are normal.     Palpations: Abdomen is soft.  Skin:    General: Skin is warm and dry.     Capillary Refill: Capillary refill takes less than 2 seconds.  Neurological:     Mental Status: She is alert and oriented to person, place, and time.     Comments: Cranial nerves II through XII grossly intact.  Intact finger-nose, intact heel-to-shin.  Romberg negative, gait normal.  Alert and oriented x3.  Moves all 4 limbs spontaneously, normal coordination.  No pronator drift.  Intact strength 5 out of 5 bilateral upper and lower extremities.  Psychiatric:        Mood and Affect: Mood normal.        Behavior: Behavior normal.     (all labs ordered are listed, but only abnormal results are displayed) Labs Reviewed  CBC - Abnormal; Notable for the following components:      Result Value   Hemoglobin 11.1 (*)    MCH 23.8 (*)    MCHC 29.0 (*)    RDW 21.9 (*)    All other components within normal limits  BASIC METABOLIC PANEL WITH GFR - Abnormal; Notable for the following  components:   CO2 19 (*)    Glucose, Bld 114 (*)    BUN 27 (*)    Creatinine, Ser 1.27 (*)    GFR, Estimated 41 (*)    All other components within normal limits  CBG MONITORING, ED - Abnormal; Notable for the following components:   Glucose-Capillary 112 (*)    All other components within normal limits    EKG: None  Radiology: CT Head Wo Contrast Result Date: 10/16/2023 CLINICAL DATA:  Headache. Patient reports having a shot in her eye at [facility]. EXAM: CT HEAD WITHOUT CONTRAST TECHNIQUE: Contiguous axial images were obtained  from the base of the skull through the vertex without intravenous contrast. RADIATION DOSE REDUCTION: This exam was performed according to the departmental dose-optimization program which includes automated exposure control, adjustment of the mA and/or kV according to patient size and/or use of iterative reconstruction technique. COMPARISON:  Head CT 08/17/2021 FINDINGS: Brain: No intracranial hemorrhage, mass effect, or midline shift. Stable atrophy. No hydrocephalus. The basilar cisterns are patent. Stable but advanced periventricular chronic small vessel ischemia. Chronic ischemic changes in the basal ganglia. No evidence of territorial infarct or acute ischemia. No extra-axial or intracranial fluid collection. Vascular: No hyperdense vessel or unexpected calcification. Skull: No fracture or focal lesion. Sinuses/Orbits: Bilateral lens resection. The orbits are otherwise unremarkable. No paranasal sinus inflammation. No mastoid effusion. Other: None. IMPRESSION: 1. No acute intracranial abnormality. 2. Stable atrophy and chronic small vessel ischemia. Electronically Signed   By: Andrea Gasman M.D.   On: 10/16/2023 20:19     Procedures   Medications Ordered in the ED  fluorescein  ophthalmic strip 1 strip (has no administration in time range)  tetracaine  (PONTOCAINE) 0.5 % ophthalmic solution 2 drop (has no administration in time range)  acetaminophen   (TYLENOL ) tablet 650 mg (650 mg Oral Given 10/16/23 1805)                                    Medical Decision Making Amount and/or Complexity of Data Reviewed Labs: ordered. Radiology: ordered.  Risk OTC drugs. Prescription drug management.   This patient is a 83 y.o. female  who presents to the ED for concern of headache, blurred vision.   Differential diagnoses prior to evaluation: The emergent differential diagnosis includes, but is not limited to,  Stroke, increased ICP, meningitis, CVA, intracranial tumor, venous sinus thrombosis, migraine, cluster headache, hypertension, drug related, head injury, tension headache, sinusitis, dental abscess, otitis media, TMJ --acute angle-closure glaucoma, other complication related to her eye injection performed yesterday. This is not an exhaustive differential.   Past Medical History / Co-morbidities / Social History: hypothyroidism, anxiety, depression, hypertension, CAD  Physical Exam: Physical exam performed. The pertinent findings include: Cranial nerves II through XII grossly intact.  Intact finger-nose, intact heel-to-shin.  Romberg negative, gait normal.  Alert and oriented x3.  Moves all 4 limbs spontaneously, normal coordination.  No pronator drift.  Intact strength 5 out of 5 bilateral upper and lower extremities.    Mild conjunctival injection on left  Intraocular pressure 21 mmHg on the left.  Fluorescein  exam reveals no Sidel sign, no acute corneal abrasion or corneal ulcer.   Lab Tests/Imaging studies: I personally interpreted labs/imaging and the pertinent results include: CBC with mild anemia, hemoglobin 11.1.  BMP with mild bicarb diskette, CO2 19, BUN, creatinine mildly elevated, fairly stable compared to baseline.  CT head without contrast with no acute changes.  Age-related microvascular changes.. I agree with the radiologist interpretation.  Cardiac monitoring: EKG obtained and interpreted by myself and attending  physician which shows: NSR, no acute ST-T changes   Medications: I ordered medication including Tylenol  for pain.  I have reviewed the patients home medicines and have made adjustments as needed.   Disposition: After consideration of the diagnostic results and the patients response to treatment, I feel that patient with probable tension headache from straining after blurry vision related to recent eye procedure.  Headache resolved with Tylenol , feel the patient is stable for discharge at this time.  emergency department  workup does not suggest an emergent condition requiring admission or immediate intervention beyond what has been performed at this time. The plan is: as above. The patient is safe for discharge and has been instructed to return immediately for worsening symptoms, change in symptoms or any other concerns.   Final diagnoses:  Acute nonintractable headache, unspecified headache type  Blurred vision    ED Discharge Orders     None          Rosan Sherlean VEAR DEVONNA 10/16/23 2043    [name] Faden, MD 10/17/23 2252

## 2023-10-16 NOTE — ED Provider Notes (Signed)
 [name]    CSN: [id number] Arrival date & time: 10/16/23  1554      History   Chief Complaint Chief Complaint  Patient presents with   Hypertension   Eye Pain   Headache    HPI [name] [name] is a 83 y.o. female.   Patient here today for evaluation of elevated blood pressure at home, and feeling like her heart is racing.  She states that at home her blood pressure was 200 systolic.  She does note that she is a wrist cuff.  She states she had an eye injection yesterday and states this was painful and typically gets night.  She states that she has had a headache since that time as well as some nausea.  She denies any chest pain or shortness of breath.   Hypertension Associated symptoms include headaches. Pertinent negatives include no chest pain and no shortness of breath.  Eye Pain Associated symptoms include headaches. Pertinent negatives include no chest pain and no shortness of breath.  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)   HTN (hypertension)    Hyperlipidemia    Hypothyroidism    Personal history of chemotherapy 2002   Personal history of radiation therapy 2002   Psoriasis    S/P thyroidectomy 07/2005   2 cm largest diameter (1 other tiny focus)/ i-131 rx 99 mci 08/2005   Thyroid  cancer (HCC)    Papillary Stage 1 - Dr Kassie   Vitamin B12 deficiency 2009   Vitamin D  deficiency 2009    Patient Active Problem List   Diagnosis Date Noted   Diabetes mellitus type 2, noninsulin dependent (HCC) 09/03/2023   Rash 08/08/2023   Memory loss 07/21/2023   Rectal bleeding 07/12/2023   Intrinsic eczema 07/12/2023    Fall 07/12/2023   Acute pain of left knee 07/12/2023   Closed patellar sleeve fracture of left knee 07/12/2023   Abdominal pain 06/17/2023   Dysuria 12/30/2022   Bowel habit changes 12/18/2022   GERD (gastroesophageal reflux disease) 03/05/2022   Dysphagia 03/05/2022   Low back pain 12/04/2021   Low back problem 12/04/2021   Gait disorder 12/04/2021   Palpitations 02/19/2021   DOE (dyspnea on exertion) 11/16/2020   Chronic sinusitis 11/16/2020   Upper respiratory infection 09/28/2020   Retinal artery occlusion, branch, right 08/09/2020   History of TIA (transient ischemic attack) 08/09/2020   Hyperparathyroidism (HCC) 06/13/2020   Skin lesion 03/21/2020   Cerebrovascular disease 03/21/2020   Pruritic condition 03/14/2020   Ataxia 07/20/2019   Seroma of breast 07/20/2019   Cerumen impaction 03/23/2019   Wrist pain 03/23/2019   Right ear pain 06/27/2018   Coronary artery disease 01/20/2018   Breast cyst, left 09/17/2017   Aortic atherosclerosis (HCC) 07/28/2017   Osteoporosis 05/07/2017   Genetic testing 12/05/2016   Family history of breast cancer    Port catheter in place 10/23/2016   Encounter for antineoplastic chemotherapy 10/23/2016   Malignant neoplasm of upper-outer quadrant of left breast in female, estrogen receptor positive (HCC) 07/29/2016   Well adult exam 06/05/2015   Neoplasm of uncertain behavior  of skin 06/05/2015   Adenopathy, cervical 06/05/2015   Sinus tachycardia 06/07/2014   Wart viral 10/18/2011   Hyperglycemia 11/06/2010   Actinic keratoses 05/25/2010   Essential hypertension 02/23/2010   SHOULDER PAIN 11/22/2009   Chronic fatigue 11/08/2009   RLQ PAIN 11/08/2009   VERTIGO 07/11/2009   ECZEMA 10/19/2008   CT, CHEST, ABNORMAL 05/30/2008   STYE 02/17/2008   B12 deficiency 12/16/2007   Vitamin D  deficiency 12/16/2007   HYPOTHYROIDISM, POSTSURGICAL 12/01/2007   Acute sinusitis 11/11/2007   ALLERGIC RESPIRATORY DISEASE, EXTRINSIC 11/11/2007    THYROID  CANCER 08/19/2007   Dyslipidemia 08/19/2007   Anxiety disorder 08/19/2007   Situational depression 08/19/2007   Allergic rhinitis 08/19/2007    Past Surgical History:  Procedure Laterality Date   BREAST LUMPECTOMY     BREAST LUMPECTOMY WITH RADIOACTIVE SEED AND SENTINEL LYMPH NODE BIOPSY Left 09/11/2016   Procedure: LEFT BREAST LUMPECTOMY WITH RADIOACTIVE SEED AND LEFT AXILLARY SENTINEL LYMPH NODE BIOPSY WITH BLUE DYE INJECTION;  Surgeon: Gail Favorite, MD;  Location: [facility];  Service: General;  Laterality: Left;   ESOPHAGEAL MANOMETRY N/A 10/08/2023   Procedure: MANOMETRY, ESOPHAGUS;  Surgeon: Federico Rosario BROCKS, MD;  Location: [facility];  Service: Gastroenterology;  Laterality: N/A;   IR FLUORO GUIDE PORT INSERTION RIGHT  09/13/2016   IR REMOVAL TUN ACCESS W/ PORT W/O FL MOD SED  12/30/2016   IR US  GUIDE VASC ACCESS RIGHT  09/13/2016   MASTECTOMY Right    Right   PORTACATH PLACEMENT N/A 09/11/2016   Procedure: ATTEMPTED INSERTION PORT-A-CATH WITH ULTRA SOUND GUIDANCE;  Surgeon: Gail Favorite, MD;  Location: [facility];  Service: General;  Laterality: N/A;   REDUCTION MAMMAPLASTY Left 2005   THYROIDECTOMY  2007    OB History   No obstetric history on file.      Home Medications    Prior to Admission medications   Medication Sig Start Date End Date Taking? Authorizing Provider  ALPRAZolam  (XANAX ) 1 MG tablet Take 1 tablet (1 mg total) by mouth 3 (three) times daily as needed for anxiety. 08/06/23   Plotnikov, Aleksei V, MD  anastrozole  (ARIMIDEX ) 1 MG tablet TAKE 1 TABLET BY MOUTH EVERY DAY 09/09/23   Causey, Morna Pickle, NP  Blood Glucose Monitoring Suppl DEVI 1 each by Does not apply route in the morning, at noon, and at bedtime. May substitute to any manufacturer covered by patient's insurance. 10/15/23   Plotnikov, Aleksei V, MD  clobetasol  ointment (TEMOVATE ) 0.05 % Apply 1 Application topically 2 (two) times daily. rash 10/15/23    Plotnikov, Karlynn GAILS, MD  clopidogrel  (PLAVIX ) 75 MG tablet Take 1 tablet (75 mg total) by mouth daily. 09/09/23   Plotnikov, Karlynn GAILS, MD  donepezil  (ARICEPT ) 5 MG tablet Take 1 tablet (5 mg total) by mouth at bedtime. Patient not taking: Reported on 08/28/2023 07/21/23   Plotnikov, Karlynn GAILS, MD  escitalopram  (LEXAPRO ) 10 MG tablet Take 1 tablet (10 mg total) by mouth daily. 10/15/23   Plotnikov, Aleksei V, MD  Glucose Blood (BLOOD GLUCOSE TEST STRIPS) STRP 1 each by In Vitro route in the morning, at noon, and at bedtime. May substitute to any manufacturer covered by patient's insurance. 10/15/23 11/14/23  Plotnikov, Aleksei V, MD  Lancet Device MISC 1 each by Does not apply route in the morning, at noon, and at bedtime. May substitute to any manufacturer covered by patient's insurance. 10/15/23 11/14/23  Plotnikov, Karlynn GAILS, MD  Lancets Misc. MISC 1 each by Does not apply  route in the morning, at noon, and at bedtime. May substitute to any manufacturer covered by patient's insurance. 10/15/23 11/14/23  Plotnikov, Aleksei V, MD  levothyroxine  (SYNTHROID ) 112 MCG tablet TAKE 1 TABLET BY MOUTH EVERY DAY 11/28/22   Plotnikov, Aleksei V, MD  metFORMIN  (GLUCOPHAGE ) 500 MG tablet Take 1 tablet (500 mg total) by mouth daily with breakfast. 10/15/23   Plotnikov, Karlynn GAILS, MD  metoprolol  succinate (TOPROL -XL) 25 MG 24 hr tablet Take 12.5 mg by mouth at bedtime. Patient not taking: Reported on 10/15/2023 06/03/23   [provider]  prednisoLONE acetate (PRED FORTE) 1 % ophthalmic suspension PLEASE SEE ATTACHED FOR DETAILED DIRECTIONS 04/15/23   [provider]  repaglinide  (PRANDIN ) 0.5 MG tablet Take 1 tablet (0.5 mg total) by mouth 3 (three) times daily before meals. 09/03/23   Plotnikov, Aleksei V, MD  rosuvastatin  (CRESTOR ) 20 MG tablet TAKE 1 TABLET BY MOUTH EVERY DAY 03/27/23   Plotnikov, Aleksei V, MD  triamcinolone  (KENALOG ) 0.025 % ointment Apply 1 Application topically 2 (two) times daily. 10/08/23    Mayer, Jodi R, NP  vitamin B-12 (CYANOCOBALAMIN ) 1000 MCG tablet Take 1,000 mcg by mouth daily.    [provider]    Family History Family History  Problem Relation Age of Onset   Stroke Mother    Allergies Mother    Asthma Mother    Clotting disorder Mother    Heart disease Father 45       MI   Allergies Sister    Asthma Sister    Breast cancer Sister 47       Lymphoma 78; currently 69   Lymphoma Sister    Hyperparathyroidism Sister    Asthma Brother    Leukemia Brother        dx 73s   Allergies Daughter    Asthma Daughter    Allergies Sister    Breast cancer Sister 83       currently 9   Kidney cancer Paternal Aunt        kidney ca; deceased 31   Liver cancer Paternal Uncle        unk. primary (liver)   Throat cancer Maternal Grandmother        deceased 56   Lung cancer Maternal Grandfather        deceased 59   Pancreatic cancer Paternal Grandfather        deceased 64   Cancer Paternal Aunt        abdominal; deceased 68    Social History Social History   Tobacco Use   Smoking status: Never   Smokeless tobacco: Never  Vaping Use   Vaping status: Never Used  Substance Use Topics   Alcohol use: No   Drug use: No     Allergies   Pneumococcal vaccines, Citalopram , Sulfa antibiotics, Sulfacetamide sodium-sulfur , Sulfamethoxazole-trimethoprim, Tape, and Tegaderm ag mesh [silver]   Review of Systems Review of Systems  Constitutional:  Negative for fever.  Eyes:  Positive for pain and visual disturbance.  Respiratory:  Negative for shortness of breath.   Cardiovascular:  Negative for chest pain.  Gastrointestinal:  Positive for nausea. Negative for vomiting.  Neurological:  Positive for headaches.     Physical Exam Triage Vital Signs ED Triage Vitals  Encounter Vitals Group     BP 10/16/23 1606 118/84     Girls Systolic BP Percentile --      Girls Diastolic BP Percentile --      Boys Systolic BP Percentile --  Boys Diastolic BP  Percentile --      Pulse Rate 10/16/23 1606 (!) 125     Resp 10/16/23 1606 20     Temp 10/16/23 1606 (!) 97.2 F (36.2 C)     Temp Source 10/16/23 1606 Oral     SpO2 10/16/23 1606 96 %     Weight 10/16/23 1616 151 lb 0.2 oz (68.5 kg)     Height 10/16/23 1616 5' 4 (1.626 m)     Head Circumference --      Peak Flow --      Pain Score 10/16/23 1616 0     Pain Loc --      Pain Education --      Exclude from Growth Chart --    No data found.  Updated Vital Signs BP 118/84 (BP Location: Left Arm)   Pulse (!) 104   Temp (!) 97.2 F (36.2 C) (Oral)   Resp 20   Ht 5' 4 (1.626 m)   Wt 151 lb 0.2 oz (68.5 kg)   SpO2 96%   BMI 25.92 kg/m   Visual Acuity Right Eye Distance:   Left Eye Distance:   Bilateral Distance:    Right Eye Near:   Left Eye Near:    Bilateral Near:     Physical Exam Vitals and nursing note reviewed.  Constitutional:      General: She is not in acute distress.    Appearance: She is well-developed. She is not ill-appearing.  HENT:     Head: Normocephalic.  Eyes:     Comments: Mild conjunctival injection to inner left eye  Cardiovascular:     Rate and Rhythm: Tachycardia present.  Pulmonary:     Effort: Pulmonary effort is normal. No respiratory distress.  Neurological:     Mental Status: She is alert.     Comments: No facial droop, normal speech      UC Treatments / Results  Labs (all labs ordered are listed, but only abnormal results are displayed) Labs Reviewed  GLUCOSE, POCT (MANUAL RESULT ENTRY) - Normal    EKG   Radiology No results found.  Procedures Procedures (including critical care time)  Medications Ordered in UC Medications - No data to display  Initial Impression / Assessment and Plan / UC Course  I have reviewed the triage vital signs and the nursing notes.  Pertinent labs & imaging results that were available during my care of the patient were reviewed by me and considered in my medical decision making (see chart  for details).    Glucose level normal in office.  Recommended further evaluation in the emergency room for tonometry and possible imaging given headache and vision changes.  Blood pressure is normal in office and reassured patient of same.  She is mildly tachycardic.  Patient is agreeable to be seen in the emergency room and husband will transport via POV.  Final Clinical Impressions(s) / UC Diagnoses   Final diagnoses:  Diabetes mellitus type 2, noninsulin dependent (HCC)  Essential hypertension  Acute nonintractable headache, unspecified headache type  Vision changes   Discharge Instructions   None    ED Prescriptions   None    PDMP not reviewed this encounter.   Billy Asberry FALCON, PA-C 10/16/23 [phone]

## 2023-10-16 NOTE — ED Triage Notes (Signed)
 Patient with headache after having a shot in her eye at [facility].

## 2023-10-17 NOTE — Telephone Encounter (Signed)
 Referral placed by PCP on 09/03

## 2023-10-22 ENCOUNTER — Ambulatory Visit

## 2023-10-24 ENCOUNTER — Ambulatory Visit

## 2023-10-27 ENCOUNTER — Ambulatory Visit: Payer: Self-pay

## 2023-10-27 ENCOUNTER — Encounter: Payer: Self-pay | Admitting: Oncology

## 2023-10-27 NOTE — Telephone Encounter (Signed)
 FYI Only or Action Required?: FYI only for provider.  Patient was last seen in primary care on 10/15/2023 by Plotnikov, Karlynn GAILS, MD.  Called Nurse Triage reporting Diarrhea. Abdominal pain  Symptoms began sept 3rd.  Interventions attempted: Other: discontinued Metformin  and Fe.  Symptoms are: unchanged.  Triage Disposition: See Physician Within 24 Hours  Patient/caregiver understands and will follow disposition?: Yes                      Copied from CRM [id number]. Topic: Clinical - Red Word Triage >> Oct 27, 2023  5:33 PM Tinnie BROCKS wrote: Red Word that prompted transfer to Nurse Triage: Extreme diarrhea since starting Metformin  9/3. Reason for Disposition  [1] MODERATE pain (e.g., interferes with normal activities) AND [2] pain comes and goes (cramps) AND [3] present > 24 hours  (Exception: Pain with Vomiting or Diarrhea - see that Guideline.)  Answer Assessment - Initial Assessment Questions Pt has had diarrhea and intermittent abdominal pain since starting Metformin . Pt has d/c'd metformin , however diarrhea and abdominal pain continues. Pt went to pharmacy and purchase otc acid reducer, which she recently took this afternoon. PT states she just does not feel well.          1. LOCATION: Where does it hurt?      Lower abdomen 2. RADIATION: Does the pain shoot anywhere else? (e.g., chest, back)     no 3. ONSET: When did the pain begin? (e.g., minutes, hours or days ago)      September 3 4. SUDDEN: Gradual or sudden onset?     gradual 5. PATTERN Does the pain come and go, or is it constant?     Comes and goes 6. SEVERITY: How bad is the pain?  (e.g., Scale 1-10; mild, moderate, or severe)     4-5/10 7. RECURRENT SYMPTOM: Have you ever had this type of stomach pain before? If Yes, ask: When was the last time? and What happened that time?      no 8. CAUSE: What do you think is causing the stomach pain? (e.g., gallstones, recent  abdominal surgery)     metformin  9. RELIEVING/AGGRAVATING FACTORS: What makes it better or worse? (e.g., antacids, bending or twisting motion, bowel movement)     nothing 10. OTHER SYMPTOMS: Do you have any other symptoms? (e.g., back pain, diarrhea, fever, urination pain, vomiting)       diarrhea  Answer Assessment - Initial Assessment Questions 1. DIARRHEA SEVERITY: How bad is the diarrhea? How many more stools have you had in the past 24 hours than normal?      many 2. ONSET: When did the diarrhea begin?      After taking metformin  3. STOOL DESCRIPTION:  How loose or watery is the diarrhea? What is the stool color? Is there any blood or mucous in the stool?     diarrhea 4. VOMITING: Are you also vomiting? If Yes, ask: How many times in the past 24 hours?      No - nausea and spit up liquid 5. ABDOMEN PAIN: Are you having any abdomen pain? If Yes, ask: What does it feel like? (e.g., crampy, dull, intermittent, constant)      Lower abdomen - comes and goes 6. ABDOMEN PAIN SEVERITY: If present, ask: How bad is the pain?  (e.g., Scale 1-10; mild, moderate, or severe)     4/10 7. ORAL INTAKE: If vomiting, Have you been able to drink liquids? How much liquids have you had  in the past 24 hours?     no 8. HYDRATION: Any signs of dehydration? (e.g., dry mouth [not just dry lips], too weak to stand, dizziness, new weight loss) When did you last urinate?     no  11. OTHER SYMPTOMS: Do you have any other symptoms? (e.g., fever, blood in stool)       Abdominal pain  Protocols used: Diarrhea-A-AH, Abdominal Pain - Female-A-AH

## 2023-10-28 ENCOUNTER — Encounter: Payer: Self-pay | Admitting: Emergency Medicine

## 2023-10-28 ENCOUNTER — Ambulatory Visit (INDEPENDENT_AMBULATORY_CARE_PROVIDER_SITE_OTHER): Admitting: Emergency Medicine

## 2023-10-28 ENCOUNTER — Ambulatory Visit: Payer: Self-pay

## 2023-10-28 ENCOUNTER — Ambulatory Visit
Admission: RE | Admit: 2023-10-28 | Discharge: 2023-10-28 | Disposition: A | Discharge status: Home / Self Care | Source: Ambulatory Visit | Attending: Hematology and Oncology | Admitting: Hematology and Oncology

## 2023-10-28 VITALS — BP 104/68 | HR 105 | Temp 98.5°F | Ht 64.0 in | Wt 146.0 lb

## 2023-10-28 DIAGNOSIS — I152 Hypertension secondary to endocrine disorders: Secondary | ICD-10-CM

## 2023-10-28 DIAGNOSIS — E119 Type 2 diabetes mellitus without complications: Secondary | ICD-10-CM

## 2023-10-28 DIAGNOSIS — C50412 Malignant neoplasm of upper-outer quadrant of left female breast: Secondary | ICD-10-CM

## 2023-10-28 DIAGNOSIS — Z1231 Encounter for screening mammogram for malignant neoplasm of breast: Secondary | ICD-10-CM | POA: Diagnosis not present

## 2023-10-28 DIAGNOSIS — I10 Essential (primary) hypertension: Secondary | ICD-10-CM

## 2023-10-28 DIAGNOSIS — E785 Hyperlipidemia, unspecified: Secondary | ICD-10-CM

## 2023-10-28 DIAGNOSIS — E1159 Type 2 diabetes mellitus with other circulatory complications: Secondary | ICD-10-CM

## 2023-10-28 DIAGNOSIS — K521 Toxic gastroenteritis and colitis: Secondary | ICD-10-CM | POA: Diagnosis not present

## 2023-10-28 LAB — GLUCOSE, POCT (MANUAL RESULT ENTRY): POC Glucose: 287 mg/dL — AB (ref 70–99)

## 2023-10-28 NOTE — Patient Instructions (Signed)
 Health Maintenance After Age 84 After age 27, you are at a higher risk for certain long-term diseases and infections as well as injuries from falls. Falls are a major cause of broken bones and head injuries in people who are older than age 73. Getting regular preventive care can help to keep you healthy and well. Preventive care includes getting regular testing and making lifestyle changes as recommended by your health care provider. Talk with your health care provider about: Which screenings and tests you should have. A screening is a test that checks for a disease when you have no symptoms. A diet and exercise plan that is right for you. What should I know about screenings and tests to prevent falls? Screening and testing are the best ways to find a health problem early. Early diagnosis and treatment give you the best chance of managing medical conditions that are common after age 90. Certain conditions and lifestyle choices may make you more likely to have a fall. Your health care provider may recommend: Regular vision checks. Poor vision and conditions such as cataracts can make you more likely to have a fall. If you wear glasses, make sure to get your prescription updated if your vision changes. Medicine review. Work with your health care provider to regularly review all of the medicines you are taking, including over-the-counter medicines. Ask your health care provider about any side effects that may make you more likely to have a fall. Tell your health care provider if any medicines that you take make you feel dizzy or sleepy. Strength and balance checks. Your health care provider may recommend certain tests to check your strength and balance while standing, walking, or changing positions. Foot health exam. Foot pain and numbness, as well as not wearing proper footwear, can make you more likely to have a fall. Screenings, including: Osteoporosis screening. Osteoporosis is a condition that causes  the bones to get weaker and break more easily. Blood pressure screening. Blood pressure changes and medicines to control blood pressure can make you feel dizzy. Depression screening. You may be more likely to have a fall if you have a fear of falling, feel depressed, or feel unable to do activities that you used to do. Alcohol  use screening. Using too much alcohol  can affect your balance and may make you more likely to have a fall. Follow these instructions at home: Lifestyle Do not drink alcohol  if: Your health care provider tells you not to drink. If you drink alcohol : Limit how much you have to: 0-1 drink a day for women. 0-2 drinks a day for men. Know how much alcohol  is in your drink. In the [place] one drink equals one 12 oz bottle of beer (355 mL), one 5 oz glass of wine (148 mL), or one 1 oz glass of hard liquor (44 mL). Do not use any products that contain nicotine or tobacco. These products include cigarettes, chewing tobacco, and vaping devices, such as e-cigarettes. If you need help quitting, ask your health care provider. Activity  Follow a regular exercise program to stay fit. This will help you maintain your balance. Ask your health care provider what types of exercise are appropriate for you. If you need a cane or walker, use it as recommended by your health care provider. Wear supportive shoes that have nonskid soles. Safety  Remove any tripping hazards, such as rugs, cords, and clutter. Install safety equipment such as grab bars in bathrooms and safety rails on stairs. Keep rooms and walkways  well-lit. General instructions Talk with your health care provider about your risks for falling. Tell your health care provider if: You fall. Be sure to tell your health care provider about all falls, even ones that seem minor. You feel dizzy, tiredness (fatigue), or off-balance. Take over-the-counter and prescription medicines only as told by your health care provider. These include  supplements. Eat a healthy diet and maintain a healthy weight. A healthy diet includes low-fat dairy products, low-fat (lean) meats, and fiber from whole grains, beans, and lots of fruits and vegetables. Stay current with your vaccines. Schedule regular health, dental, and eye exams. Summary Having a healthy lifestyle and getting preventive care can help to protect your health and wellness after age 15. Screening and testing are the best way to find a health problem early and help you avoid having a fall. Early diagnosis and treatment give you the best chance for managing medical conditions that are more common for people who are older than age 42. Falls are a major cause of broken bones and head injuries in people who are older than age 64. Take precautions to prevent a fall at home. Work with your health care provider to learn what changes you can make to improve your health and wellness and to prevent falls. This information is not intended to replace advice given to you by your health care provider. Make sure you discuss any questions you have with your health care provider. Document Revised: 06/19/2020 Document Reviewed: 06/19/2020 Elsevier Patient Education  2024 [employer].

## 2023-10-28 NOTE — Telephone Encounter (Signed)
 FYI Only or Action Required?: Action required by provider: Patient requesting a replacement medication.  Patient was last seen in primary care on 10/28/2023 by Purcell Emil Schanz, MD.  Called Nurse Triage reporting Hyperglycemia.  Symptoms began today.  Interventions attempted: Nothing.  Symptoms are: gradually worsening.  Triage Disposition: Call PCP Within 24 Hours  Patient/caregiver understands and will follow disposition?: Yes    Copied from CRM #[id number]. Topic: Clinical - Red Word Triage >> Oct 28, 2023  5:43 PM [place] J wrote: [name] that prompted transfer to Nurse Triage: Patient was seen today due to her blood sugar being at 278. She left the clinic without being prescribed any medication to help her and was told to stop taking metformin . Patient said that Dr. Purcell was supposed to talk with Dr. Garald about what would be appropriate for her to talk but never followed back up. Reason for Disposition  [1] Follow-up call from patient regarding patient's clinical status AND [2] information NON-URGENT  Answer Assessment - Initial Assessment Questions Patient seen in office today and states she is unsure of what medication she  would be put on in place of her metformin . She states blood sugar in office was 278 and is refusing to take blood sugar with at home device. Patient states she has had some dizziness today after having a mammogram done. Denies urinary frequency, weakness, chest pain or shortness of breath at this time. Patient advised to go to ED for worsening symptoms. Patient requesting a callback regarding medication issue.    1. REASON FOR CALL or QUESTION: What is your reason for calling today? or How can I best     Information regarding what medication to take in place of her metformin .  2. CALLER: Document the source of call. (e.g., laboratory staff, caregiver or patient).     Patient  Protocols used: PCP Call - No Triage-A-AH

## 2023-10-28 NOTE — Assessment & Plan Note (Addendum)
 BP Readings from Last 3 Encounters:  10/28/23 104/68  10/16/23 (!) 146/88  10/16/23 118/84  Well-controlled hypertension off medications at present time Lab Results  Component Value Date   HGBA1C 7.8 (H) 09/02/2023  Hemoglobin A1c below 16 in an 83 year old female. Diet and nutrition discussed.  Recommend no diabetic medication at this time.

## 2023-10-28 NOTE — Progress Notes (Signed)
 [name] [name] 83 y.o.   Chief Complaint  Patient presents with   Hospitalization Follow-up    Patient here for HFU     HISTORY OF PRESENT ILLNESS: This is a 83 y.o. female here for follow-up of emergency department visit when she presented about 10 days ago with headache Negative brain CT scan Also had diarrhea secondary to recent metformin  use.  No longer taking it.  Diarrhea much improved. Most recent hemoglobin A1c is 7.8. No other complaints or medical concerns today.   HPI   Prior to Admission medications   Medication Sig Start Date End Date Taking? Authorizing Provider  ALPRAZolam  (XANAX ) 1 MG tablet Take 1 tablet (1 mg total) by mouth 3 (three) times daily as needed for anxiety. 08/06/23   Plotnikov, Aleksei V, MD  anastrozole  (ARIMIDEX ) 1 MG tablet TAKE 1 TABLET BY MOUTH EVERY DAY 09/09/23   Causey, Morna Pickle, NP  Blood Glucose Monitoring Suppl DEVI 1 each by Does not apply route in the morning, at noon, and at bedtime. May substitute to any manufacturer covered by patient's insurance. 10/15/23   Plotnikov, Aleksei V, MD  clobetasol  ointment (TEMOVATE ) 0.05 % Apply 1 Application topically 2 (two) times daily. rash 10/15/23   Plotnikov, Karlynn GAILS, MD  clopidogrel  (PLAVIX ) 75 MG tablet Take 1 tablet (75 mg total) by mouth daily. 09/09/23   Plotnikov, Karlynn GAILS, MD  donepezil  (ARICEPT ) 5 MG tablet Take 1 tablet (5 mg total) by mouth at bedtime. Patient not taking: Reported on 08/28/2023 07/21/23   Plotnikov, Karlynn GAILS, MD  escitalopram  (LEXAPRO ) 10 MG tablet Take 1 tablet (10 mg total) by mouth daily. 10/15/23   Plotnikov, Aleksei V, MD  Glucose Blood (BLOOD GLUCOSE TEST STRIPS) STRP 1 each by In Vitro route in the morning, at noon, and at bedtime. May substitute to any manufacturer covered by patient's insurance. 10/15/23 11/14/23  Plotnikov, Aleksei V, MD  Lancet Device MISC 1 each by Does not apply route in the morning, at noon, and at bedtime. May substitute to any manufacturer  covered by patient's insurance. 10/15/23 11/14/23  Plotnikov, Karlynn GAILS, MD  Lancets Misc. MISC 1 each by Does not apply route in the morning, at noon, and at bedtime. May substitute to any manufacturer covered by patient's insurance. 10/15/23 11/14/23  Plotnikov, Aleksei V, MD  levothyroxine  (SYNTHROID ) 112 MCG tablet TAKE 1 TABLET BY MOUTH EVERY DAY 11/28/22   Plotnikov, Aleksei V, MD  metFORMIN  (GLUCOPHAGE ) 500 MG tablet Take 1 tablet (500 mg total) by mouth daily with breakfast. 10/15/23   Plotnikov, Karlynn GAILS, MD  metoprolol  succinate (TOPROL -XL) 25 MG 24 hr tablet Take 12.5 mg by mouth at bedtime. Patient not taking: Reported on 10/15/2023 06/03/23   [provider]  prednisoLONE acetate (PRED FORTE) 1 % ophthalmic suspension PLEASE SEE ATTACHED FOR DETAILED DIRECTIONS 04/15/23   [provider]  repaglinide  (PRANDIN ) 0.5 MG tablet Take 1 tablet (0.5 mg total) by mouth 3 (three) times daily before meals. 09/03/23   Plotnikov, Aleksei V, MD  rosuvastatin  (CRESTOR ) 20 MG tablet TAKE 1 TABLET BY MOUTH EVERY DAY 03/27/23   Plotnikov, Aleksei V, MD  triamcinolone  (KENALOG ) 0.025 % ointment Apply 1 Application topically 2 (two) times daily. 10/08/23   Mayer, Jodi R, NP  vitamin B-12 (CYANOCOBALAMIN ) 1000 MCG tablet Take 1,000 mcg by mouth daily.    [provider]    Allergies  Allergen Reactions   Pneumococcal Vaccines Other (See Comments)    Was really sick  Citalopram      Insomnia   Sulfa Antibiotics Other (See Comments)    Pt believes it was hives   Sulfacetamide Sodium-Sulfur     Sulfamethoxazole-Trimethoprim     Other reaction(s): Unknown   Tape Itching, Dermatitis and Rash   Tegaderm Ag Mesh [Silver] Itching and Rash    Patient Active Problem List   Diagnosis Date Noted   Diabetes mellitus type 2, noninsulin dependent (HCC) 09/03/2023   Rash 08/08/2023   Memory loss 07/21/2023   Rectal bleeding 07/12/2023   Intrinsic eczema 07/12/2023   Fall 07/12/2023   Acute  pain of left knee 07/12/2023   Closed patellar sleeve fracture of left knee 07/12/2023   Abdominal pain 06/17/2023   Dysuria 12/30/2022   Bowel habit changes 12/18/2022   GERD (gastroesophageal reflux disease) 03/05/2022   Dysphagia 03/05/2022   Low back pain 12/04/2021   Low back problem 12/04/2021   Gait disorder 12/04/2021   Palpitations 02/19/2021   DOE (dyspnea on exertion) 11/16/2020   Chronic sinusitis 11/16/2020   Upper respiratory infection 09/28/2020   Retinal artery occlusion, branch, right 08/09/2020   History of TIA (transient ischemic attack) 08/09/2020   Hyperparathyroidism (HCC) 06/13/2020   Skin lesion 03/21/2020   Cerebrovascular disease 03/21/2020   Pruritic condition 03/14/2020   Ataxia 07/20/2019   Seroma of breast 07/20/2019   Cerumen impaction 03/23/2019   Wrist pain 03/23/2019   Right ear pain 06/27/2018   Coronary artery disease 01/20/2018   Breast cyst, left 09/17/2017   Aortic atherosclerosis (HCC) 07/28/2017   Osteoporosis 05/07/2017   Genetic testing 12/05/2016   Family history of breast cancer    Port catheter in place 10/23/2016   Encounter for antineoplastic chemotherapy 10/23/2016   Malignant neoplasm of upper-outer quadrant of left breast in female, estrogen receptor positive (HCC) 07/29/2016   Well adult exam 06/05/2015   Neoplasm of uncertain behavior of skin 06/05/2015   Adenopathy, cervical 06/05/2015   Sinus tachycardia 06/07/2014   Wart viral 10/18/2011   Hyperglycemia 11/06/2010   Actinic keratoses 05/25/2010   Essential hypertension 02/23/2010   SHOULDER PAIN 11/22/2009   Chronic fatigue 11/08/2009   RLQ PAIN 11/08/2009   VERTIGO 07/11/2009   ECZEMA 10/19/2008   CT, CHEST, ABNORMAL 05/30/2008   STYE 02/17/2008   B12 deficiency 12/16/2007   Vitamin D  deficiency 12/16/2007   HYPOTHYROIDISM, POSTSURGICAL 12/01/2007   Acute sinusitis 11/11/2007   ALLERGIC RESPIRATORY DISEASE, EXTRINSIC 11/11/2007   THYROID  CANCER 08/19/2007    Dyslipidemia 08/19/2007   Anxiety disorder 08/19/2007   Situational depression 08/19/2007   Allergic rhinitis 08/19/2007    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)   HTN (hypertension)    Hyperlipidemia    Hypothyroidism    Personal history of chemotherapy 2002   Personal history of radiation therapy 2002   Psoriasis    S/P thyroidectomy 07/2005   2 cm largest diameter (1 other tiny focus)/ i-131 rx 99 [facility] 08/2005   Thyroid  cancer (HCC)    Papillary Stage 1 - Dr Kassie   Vitamin B12 deficiency 2009   Vitamin D  deficiency 2009    Past Surgical History:  Procedure Laterality Date   BREAST LUMPECTOMY     BREAST LUMPECTOMY WITH RADIOACTIVE SEED AND  SENTINEL LYMPH NODE BIOPSY Left 09/11/2016   Procedure: LEFT BREAST LUMPECTOMY WITH RADIOACTIVE SEED AND LEFT AXILLARY SENTINEL LYMPH NODE BIOPSY WITH BLUE DYE INJECTION;  Surgeon: Gail Favorite, MD;  Location: [facility];  Service: General;  Laterality: Left;   ESOPHAGEAL MANOMETRY N/A 10/08/2023   Procedure: MANOMETRY, ESOPHAGUS;  Surgeon: Federico Rosario BROCKS, MD;  Location: [facility];  Service: Gastroenterology;  Laterality: N/A;   IR FLUORO GUIDE PORT INSERTION RIGHT  09/13/2016   IR REMOVAL TUN ACCESS W/ PORT W/O FL MOD SED  12/30/2016   IR US  GUIDE VASC ACCESS RIGHT  09/13/2016   MASTECTOMY Right    Right   PORTACATH PLACEMENT N/A 09/11/2016   Procedure: ATTEMPTED INSERTION PORT-A-CATH WITH ULTRA SOUND GUIDANCE;  Surgeon: Gail Favorite, MD;  Location: [facility];  Service: General;  Laterality: N/A;   REDUCTION MAMMAPLASTY Left 2005   THYROIDECTOMY  2007    Social History   Socioeconomic History   Marital status: Married    Spouse name: Not on file    Number of children: 2   Years of education: Not on file   Highest education level: Not on file  Occupational History   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  Other Topics Concern   Not on file  Social History Narrative   GI - Dr Luis   Depression - Dr Vincente   GYN - Dr JONETTA Fu      Social Drivers of Health   Financial Resource Strain: Low Risk  (08/28/2023)   Overall Financial Resource Strain (CARDIA)    Difficulty of Paying Living Expenses: Not hard at all  Food Insecurity: No Food Insecurity (08/28/2023)   Hunger Vital Sign    Worried About Running Out of Food in the Last Year: Never true    Ran Out of Food in the Last Year: Never true  Transportation Needs: No Transportation Needs (08/28/2023)   PRAPARE - Administrator, Civil Service (Medical): No    Lack of Transportation (Non-Medical): No  Physical Activity: Inactive (08/28/2023)   Exercise Vital Sign    Days of Exercise per Week: 0 days    Minutes of Exercise per Session: 0 min  Stress: No Stress Concern Present (08/28/2023)   [employer] of Occupational Health - Occupational Stress Questionnaire    Feeling of Stress: Only a little  Social Connections: Socially Integrated (08/28/2023)   Social Connection and Isolation Panel    Frequency of Communication with Friends and Family: More than three times a week    Frequency of Social Gatherings with Friends and Family: More than three times a week    Attends Religious Services: More than 4 times per year    Active Member of [employer] or Organizations: Yes    Attends Banker Meetings: More than 4 times per year    Marital Status: Married  Catering manager Violence: Not At Risk (08/28/2023)    Humiliation, Afraid, Rape, and Kick questionnaire    Fear of Current or Ex-Partner: No    Emotionally Abused: No    Physically Abused: No    Sexually Abused: No    Family History  Problem Relation Age of  Onset   Stroke Mother    Allergies Mother    Asthma Mother    Clotting disorder Mother    Heart disease Father 54       MI   Allergies Sister    Asthma Sister    Breast cancer Sister 25       Lymphoma 58; currently 57   Lymphoma Sister    Hyperparathyroidism Sister    Asthma Brother    Leukemia Brother        dx 46s   Allergies Daughter    Asthma Daughter    Allergies Sister    Breast cancer Sister 10       currently 67   Kidney cancer Paternal Aunt        kidney ca; deceased 13   Liver cancer Paternal Uncle        unk. primary (liver)   Throat cancer Maternal Grandmother        deceased 45   Lung cancer Maternal Grandfather        deceased 26   Pancreatic cancer Paternal Grandfather        deceased 20   Cancer Paternal Aunt        abdominal; deceased 89     Review of Systems  Constitutional: Negative.  Negative for chills and fever.  HENT: Negative.  Negative for congestion and sore throat.   Respiratory: Negative.  Negative for cough and shortness of breath.   Cardiovascular: Negative.  Negative for chest pain and palpitations.  Gastrointestinal:  Negative for abdominal pain, diarrhea, nausea and vomiting.  Genitourinary: Negative.  Negative for dysuria and hematuria.  Skin: Negative.  Negative for rash.  Neurological: Negative.  Negative for dizziness and headaches.  All other systems reviewed and are negative.   Today's Vitals   10/28/23 1405  BP: 104/68  Pulse: (!) 105  Temp: 98.5 F (36.9 C)  TempSrc: Oral  SpO2: 99%  Weight: 146 lb (66.2 kg)  Height: 5' 4 (1.626 m)   Body mass index is 25.06 kg/m.   Physical Exam Vitals reviewed.  Constitutional:      Appearance: Normal appearance.  HENT:     Head: Normocephalic.      Mouth/Throat:     Mouth: Mucous membranes are moist.     Pharynx: Oropharynx is clear.  Eyes:     Extraocular Movements: Extraocular movements intact.     Pupils: Pupils are equal, round, and reactive to light.  Cardiovascular:     Rate and Rhythm: Normal rate and regular rhythm.     Pulses: Normal pulses.     Heart sounds: Normal heart sounds.  Pulmonary:     Effort: Pulmonary effort is normal.     Breath sounds: Normal breath sounds.  Musculoskeletal:     Cervical back: No tenderness.  Lymphadenopathy:     Cervical: No cervical adenopathy.  Skin:    General: Skin is warm and dry.  Neurological:     Mental Status: She is alert and oriented to person, place, and time.  Psychiatric:        Mood and Affect: Mood normal.        Behavior: Behavior normal.      ASSESSMENT & PLAN: A total of 42 minutes was spent with the patient and counseling/coordination of care regarding preparing for this visit, review of most recent office visit notes, review of most recent emergency department visit notes, review of most recent imaging reports, review of multiple chronic medical conditions and  their management, review of all medications, review of most recent bloodwork results, review of health maintenance items, education on nutrition, prognosis, documentation, and need for follow up.   Problem List Items Addressed This Visit       Cardiovascular and Mediastinum   Hypertension associated with diabetes (HCC)   BP Readings from Last 3 Encounters:  10/28/23 104/68  10/16/23 (!) 146/88  10/16/23 118/84  Well-controlled hypertension off medications at present time Lab Results  Component Value Date   HGBA1C 7.8 (H) 09/02/2023  Hemoglobin A1c below 96 in an 83 year old female. Diet and nutrition discussed.  Recommend no diabetic medication at this time.          Digestive   Diarrhea due to drug - Primary   Intolerant to metformin  Diarrhea much improved today Advised to rest and stay  well-hydrated Diet and nutrition discussed        Endocrine   Diabetes mellitus type 2, noninsulin dependent (HCC)   Relevant Orders   POCT glucose (manual entry) (Completed)     Other   Dyslipidemia   Chronic stable condition Continues rosuvastatin  20 mg daily      Patient Instructions  Health Maintenance After Age 17 After age 62, you are at a higher risk for certain long-term diseases and infections as well as injuries from falls. Falls are a major cause of broken bones and head injuries in people who are older than age 32. Getting regular preventive care can help to keep you healthy and well. Preventive care includes getting regular testing and making lifestyle changes as recommended by your health care provider. Talk with your health care provider about: Which screenings and tests you should have. A screening is a test that checks for a disease when you have no symptoms. A diet and exercise plan that is right for you. What should I know about screenings and tests to prevent falls? Screening and testing are the best ways to find a health problem early. Early diagnosis and treatment give you the best chance of managing medical conditions that are common after age 12. Certain conditions and lifestyle choices may make you more likely to have a fall. Your health care provider may recommend: Regular vision checks. Poor vision and conditions such as cataracts can make you more likely to have a fall. If you wear glasses, make sure to get your prescription updated if your vision changes. Medicine review. Work with your health care provider to regularly review all of the medicines you are taking, including over-the-counter medicines. Ask your health care provider about any side effects that may make you more likely to have a fall. Tell your health care provider if any medicines that you take make you feel dizzy or sleepy. Strength and balance checks. Your health care provider may recommend  certain tests to check your strength and balance while standing, walking, or changing positions. Foot health exam. Foot pain and numbness, as well as not wearing proper footwear, can make you more likely to have a fall. Screenings, including: Osteoporosis screening. Osteoporosis is a condition that causes the bones to get weaker and break more easily. Blood pressure screening. Blood pressure changes and medicines to control blood pressure can make you feel dizzy. Depression screening. You may be more likely to have a fall if you have a fear of falling, feel depressed, or feel unable to do activities that you used to do. Alcohol use screening. Using too much alcohol can affect your balance and may make you more  likely to have a fall. Follow these instructions at home: Lifestyle Do not drink alcohol if: Your health care provider tells you not to drink. If you drink alcohol: Limit how much you have to: 0-1 drink a day for women. 0-2 drinks a day for men. Know how much alcohol is in your drink. In the [place] one drink equals one 12 oz bottle of beer (355 mL), one 5 oz glass of wine (148 mL), or one 1 oz glass of hard liquor (44 mL). Do not use any products that contain nicotine or tobacco. These products include cigarettes, chewing tobacco, and vaping devices, such as e-cigarettes. If you need help quitting, ask your health care provider. Activity  Follow a regular exercise program to stay fit. This will help you maintain your balance. Ask your health care provider what types of exercise are appropriate for you. If you need a cane or walker, use it as recommended by your health care provider. Wear supportive shoes that have nonskid soles. Safety  Remove any tripping hazards, such as rugs, cords, and clutter. Install safety equipment such as grab bars in bathrooms and safety rails on stairs. Keep rooms and walkways well-lit. General instructions Talk with your health care provider about your  risks for falling. Tell your health care provider if: You fall. Be sure to tell your health care provider about all falls, even ones that seem minor. You feel dizzy, tiredness (fatigue), or off-balance. Take over-the-counter and prescription medicines only as told by your health care provider. These include supplements. Eat a healthy diet and maintain a healthy weight. A healthy diet includes low-fat dairy products, low-fat (lean) meats, and fiber from whole grains, beans, and lots of fruits and vegetables. Stay current with your vaccines. Schedule regular health, dental, and eye exams. Summary Having a healthy lifestyle and getting preventive care can help to protect your health and wellness after age 53. Screening and testing are the best way to find a health problem early and help you avoid having a fall. Early diagnosis and treatment give you the best chance for managing medical conditions that are more common for people who are older than age 35. Falls are a major cause of broken bones and head injuries in people who are older than age 77. Take precautions to prevent a fall at home. Work with your health care provider to learn what changes you can make to improve your health and wellness and to prevent falls. This information is not intended to replace advice given to you by your health care provider. Make sure you discuss any questions you have with your health care provider. Document Revised: 06/19/2020 Document Reviewed: 06/19/2020 Elsevier Patient Education  2024 Elsevier Inc.     Emil Schaumann, MD [facility]

## 2023-10-28 NOTE — Assessment & Plan Note (Signed)
Chronic stable condition Continues rosuvastatin 20 mg daily

## 2023-10-28 NOTE — Assessment & Plan Note (Signed)
 Intolerant to metformin  Diarrhea much improved today Advised to rest and stay well-hydrated Diet and nutrition discussed

## 2023-10-29 ENCOUNTER — Telehealth: Payer: Self-pay | Admitting: Radiology

## 2023-10-29 NOTE — Telephone Encounter (Signed)
 Copied from CRM #[id number]. Topic: Clinical - Red Word Triage >> Oct 28, 2023  5:43 PM [place] J wrote: [name] that prompted transfer to Nurse Triage: Patient was seen today due to her blood sugar being at 278. She left the clinic without being prescribed any medication to help her and was told to stop taking metformin . Patient said that Dr. Purcell was supposed to talk with Dr. Garald about what would be appropriate for her to talk but never followed back up. >> Oct 29, 2023  1:31 PM Rosina D wrote: Patient called stating she is checking on a previous message regarding her metformin  medication. Patient stated the metformin  makes her lower stomach hurt and she took a mild laxative to help. Patient took part of an enema to help also. Patient stated her blood sugar was  278 when she came to see Sagarida yesterday. Patient was told not to take anymore metformin  but she does not want to stop taking it because of her blood sugar. The patient stated she took a half pill with food after she talked to the pharmacy. Patient is not able to do a bowel movement and would like something else to take

## 2023-10-31 NOTE — Addendum Note (Signed)
 Addended by: Dana Dorner V on: 10/31/2023 08:26 AM   Modules accepted: Orders

## 2023-10-31 NOTE — Telephone Encounter (Signed)
 I agree with stopping metformin . She can take 2 tablets of 0.5 mg repaglinide  3 times a day before meals or with food. Schedule a follow-up visit with me please Thanks

## 2023-10-31 NOTE — Telephone Encounter (Signed)
 Spoke with patient, she requested a sooner appointment. Moved 09/25 visit to 09/22. Patient gave verbal understanding, has enough medication to last her

## 2023-11-03 ENCOUNTER — Ambulatory Visit (INDEPENDENT_AMBULATORY_CARE_PROVIDER_SITE_OTHER): Admitting: Internal Medicine

## 2023-11-03 ENCOUNTER — Encounter: Payer: Self-pay | Admitting: Internal Medicine

## 2023-11-03 VITALS — BP 98/70 | HR 122 | Temp 98.4°F | Ht 64.0 in | Wt 146.6 lb

## 2023-11-03 DIAGNOSIS — F419 Anxiety disorder, unspecified: Secondary | ICD-10-CM | POA: Diagnosis not present

## 2023-11-03 DIAGNOSIS — R270 Ataxia, unspecified: Secondary | ICD-10-CM

## 2023-11-03 DIAGNOSIS — R269 Unspecified abnormalities of gait and mobility: Secondary | ICD-10-CM

## 2023-11-03 DIAGNOSIS — E119 Type 2 diabetes mellitus without complications: Secondary | ICD-10-CM

## 2023-11-03 DIAGNOSIS — E538 Deficiency of other specified B group vitamins: Secondary | ICD-10-CM

## 2023-11-03 LAB — GLUCOSE, POCT (MANUAL RESULT ENTRY): POC Glucose: 162 mg/dL — AB (ref 70–99)

## 2023-11-03 MED ORDER — REPAGLINIDE 2 MG PO TABS: 2.0000 mg | ORAL_TABLET | Freq: Three times a day (TID) | ORAL | 11 refills | Status: DC

## 2023-11-03 MED ORDER — DEXCOM G7 RECEIVER DEVI: 1 refills | Status: AC

## 2023-11-03 MED ORDER — DEXCOM G7 SENSOR MISC: 11 refills | Status: AC

## 2023-11-03 NOTE — Assessment & Plan Note (Signed)
 On B12

## 2023-11-03 NOTE — Progress Notes (Signed)
 Subjective:  Patient ID: [name] [name], female    DOB: 01-20-1941  Age: 83 y.o. MRN: [id number]  CC: Blood Sugar Problem (Patient asking for her sugar to be checked. )   HPI [name] [name] presents for LBP, gait disorder, anxiety, DM On Prandin  1 mg prn  Outpatient Medications Prior to Visit  Medication Sig Dispense Refill   ALPRAZolam  (XANAX ) 1 MG tablet Take 1 tablet (1 mg total) by mouth 3 (three) times daily as needed for anxiety. 90 tablet 2   anastrozole  (ARIMIDEX ) 1 MG tablet TAKE 1 TABLET BY MOUTH EVERY DAY 90 tablet 1   Blood Glucose Monitoring Suppl DEVI 1 each by Does not apply route in the morning, at noon, and at bedtime. May substitute to any manufacturer covered by patient's insurance. 1 each 0   clobetasol  ointment (TEMOVATE ) 0.05 % Apply 1 Application topically 2 (two) times daily. rash 120 g 1   clopidogrel  (PLAVIX ) 75 MG tablet Take 1 tablet (75 mg total) by mouth daily. 90 tablet 3   donepezil  (ARICEPT ) 5 MG tablet Take 1 tablet (5 mg total) by mouth at bedtime. 90 tablet 1   Glucose Blood (BLOOD GLUCOSE TEST STRIPS) STRP 1 each by In Vitro route in the morning, at noon, and at bedtime. May substitute to any manufacturer covered by patient's insurance. 100 strip 0   Lancet Device MISC 1 each by Does not apply route in the morning, at noon, and at bedtime. May substitute to any manufacturer covered by patient's insurance. 1 each 0   Lancets Misc. MISC 1 each by Does not apply route in the morning, at noon, and at bedtime. May substitute to any manufacturer covered by patient's insurance. 100 each 0   levothyroxine  (SYNTHROID ) 112 MCG tablet TAKE 1 TABLET BY MOUTH EVERY DAY 90 tablet 3   metoprolol  succinate (TOPROL -XL) 25 MG 24 hr tablet Take 12.5 mg by mouth at bedtime.     prednisoLONE acetate (PRED FORTE) 1 % ophthalmic suspension PLEASE SEE ATTACHED FOR DETAILED DIRECTIONS     rosuvastatin  (CRESTOR ) 20 MG tablet TAKE 1 TABLET BY MOUTH EVERY DAY 90 tablet 1    triamcinolone  (KENALOG ) 0.025 % ointment Apply 1 Application topically 2 (two) times daily. 30 g 0   vitamin B-12 (CYANOCOBALAMIN ) 1000 MCG tablet Take 1,000 mcg by mouth daily.     escitalopram  (LEXAPRO ) 10 MG tablet Take 1 tablet (10 mg total) by mouth daily. 30 tablet 5   repaglinide  (PRANDIN ) 0.5 MG tablet Take 1 tablet (0.5 mg total) by mouth 3 (three) times daily before meals. 90 tablet 11   Facility-Administered Medications Prior to Visit  Medication Dose Route Frequency Provider Last Rate Last Admin   denosumab  (PROLIA ) injection 60 mg  60 mg Subcutaneous Once Causey, Lindsey Cornetto, NP        ROS: Review of Systems  Constitutional:  Negative for activity change, appetite change, chills, fatigue and unexpected weight change.  HENT:  Negative for congestion, mouth sores and sinus pressure.   Eyes:  Negative for visual disturbance.  Respiratory:  Negative for cough and chest tightness.   Gastrointestinal:  Negative for abdominal pain and nausea.  Genitourinary:  Negative for difficulty urinating, frequency and vaginal pain.  Musculoskeletal:  Positive for arthralgias, back pain and gait problem.  Skin:  Negative for pallor and rash.  Neurological:  Negative for dizziness, tremors, weakness, numbness and headaches.  Psychiatric/Behavioral:  Positive for decreased concentration. Negative for confusion, sleep disturbance and suicidal ideas. The  patient is nervous/anxious.     Objective:  BP 98/70   Pulse (!) 122   Temp 98.4 F (36.9 C) (Oral)   Ht 5' 4 (1.626 m)   Wt 146 lb 9.6 oz (66.5 kg)   SpO2 97%   BMI 25.16 kg/m   BP Readings from Last 3 Encounters:  11/11/23 92/78  11/03/23 98/70  10/28/23 104/68    Wt Readings from Last 3 Encounters:  11/11/23 145 lb 8 oz (66 kg)  11/03/23 146 lb 9.6 oz (66.5 kg)  10/28/23 146 lb (66.2 kg)    Physical Exam Constitutional:      General: She is not in acute distress.    Appearance: She is well-developed. She is obese.   HENT:     Head: Normocephalic.     Right Ear: External ear normal.     Left Ear: External ear normal.     Nose: Nose normal.  Eyes:     General:        Right eye: No discharge.        Left eye: No discharge.     Conjunctiva/sclera: Conjunctivae normal.     Pupils: Pupils are equal, round, and reactive to light.  Neck:     Thyroid : No thyromegaly.     Vascular: No JVD.     Trachea: No tracheal deviation.  Cardiovascular:     Rate and Rhythm: Normal rate and regular rhythm.     Heart sounds: Normal heart sounds.  Pulmonary:     Effort: No respiratory distress.     Breath sounds: No stridor. No wheezing.  Abdominal:     General: Bowel sounds are normal. There is no distension.     Palpations: Abdomen is soft. There is no mass.     Tenderness: There is no abdominal tenderness. There is no guarding or rebound.  Musculoskeletal:        General: No tenderness.     Cervical back: Normal range of motion and neck supple. No rigidity.  Lymphadenopathy:     Cervical: No cervical adenopathy.  Skin:    Findings: No erythema or rash.  Neurological:     Cranial Nerves: No cranial nerve deficit.     Motor: No abnormal muscle tone.     Coordination: Coordination normal.     Deep Tendon Reflexes: Reflexes normal.  Psychiatric:        Behavior: Behavior normal.        Thought Content: Thought content normal.        Judgment: Judgment normal.     Lab Results  Component Value Date   WBC 7.4 10/16/2023   HGB 11.1 (L) 10/16/2023   HCT 38.3 10/16/2023   PLT 364 10/16/2023   GLUCOSE 114 (H) 10/16/2023   CHOL 179 03/20/2023   TRIG 326.0 (H) 03/20/2023   HDL 46.20 03/20/2023   LDLDIRECT 125.0 06/05/2015   LDLCALC 67 03/20/2023   ALT 15 09/29/2023   AST 20 09/29/2023   NA 138 10/16/2023   K 4.1 10/16/2023   CL 104 10/16/2023   CREATININE 1.27 (H) 10/16/2023   BUN 27 (H) 10/16/2023   CO2 19 (L) 10/16/2023   TSH 3.53 03/20/2023   INR 1.1 08/16/2021   HGBA1C 7.8 (H) 09/02/2023     MM 3D SCREENING MAMMOGRAM UNILATERAL LEFT BREAST Result Date: 10/30/2023 CLINICAL DATA:  Screening. EXAM: DIGITAL SCREENING UNILATERAL LEFT MAMMOGRAM WITH CAD AND TOMOSYNTHESIS TECHNIQUE: Left screening digital craniocaudal and mediolateral oblique mammograms were obtained. Left screening digital  breast tomosynthesis was performed. The images were evaluated with computer-aided detection. COMPARISON:  Previous exam(s). ACR Breast Density Category b: There are scattered areas of fibroglandular density. FINDINGS: There are no findings suspicious for malignancy. IMPRESSION: No mammographic evidence of malignancy. A result letter of this screening mammogram will be mailed directly to the patient. RECOMMENDATION: Screening mammogram in one year. (Code:SM-B-01Y) BI-RADS CATEGORY  1: Negative. Electronically Signed   By: Alm Parkins M.D.   On: 10/30/2023 15:43    Assessment & Plan:   Problem List Items Addressed This Visit     Anxiety disorder   Worse Start Lexapro  10 mg/d Xanax  prn  Potential benefits of a long term benzodiazepines  use as well as potential risks  and complications were explained to the patient and were aknowledged.      Ataxia   In PT      B12 deficiency   On B12      Diabetes mellitus type 2, noninsulin dependent (HCC) - Primary   Increase Prandin  to 2 mg tid prn      Relevant Medications   repaglinide  (PRANDIN ) 2 MG tablet   Other Relevant Orders   POCT glucose (manual entry) (Completed)   Gait disorder   In PT         Meds ordered this encounter  Medications   repaglinide  (PRANDIN ) 2 MG tablet    Sig: Take 1 tablet (2 mg total) by mouth 3 (three) times daily before meals.    Dispense:  90 tablet    Refill:  11   Continuous Glucose Receiver (DEXCOM G7 RECEIVER) DEVI    Sig: Use as directed    Dispense:  1 each    Refill:  1   Continuous Glucose Sensor (DEXCOM G7 SENSOR) MISC    Sig: Replace q 10 days    Dispense:  3 each    Refill:  11       Follow-up: Return in about 4 weeks (around 12/01/2023) for a follow-up visit.  Marolyn Noel, MD

## 2023-11-03 NOTE — Assessment & Plan Note (Signed)
 Increase Prandin  to 2 mg tid prn

## 2023-11-03 NOTE — Assessment & Plan Note (Signed)
In PT 

## 2023-11-03 NOTE — Assessment & Plan Note (Signed)
 Worse Start Lexapro  10 mg/d Xanax  prn  Potential benefits of a long term benzodiazepines  use as well as potential risks  and complications were explained to the patient and were aknowledged.

## 2023-11-03 NOTE — Progress Notes (Deleted)
 [facility] History and Physical   Primary Care Physician:  Garald Karlynn GAILS, MD   Reason for Procedure:  Dysphagia, iron  deficiency anemia  Plan:    Upper endoscopy with possible dilation     HPI: [name] [name] is a 83 y.o. female undergoing upper endoscopy with possible dilation for investigation of dysphagia and iron  deficiency anemia.  At previous clinic visit patient endorsed symptoms of dysphagia to large pills.  Advised to take Protonix  40 mg p.o. twice daily.  Barium esophagram 05/23/2022 did not show a stricture but there was evidence of moderate GERD severe esophageal dysmotility.  Subsequent esophageal manometry showed evidence of distal esophageal spasm.  Patient advised to take 2 Altoid mints before each meal.    Labs have shown low hemoglobin in the range of 9-11 as well as iron  deficiency with iron  37, ferritin 5, TIBC 480.  Colonoscopy in 2020 was normal.  No prior EGD.   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)   HTN (hypertension)    Hyperlipidemia    Hypothyroidism    Personal history of chemotherapy 2002   Personal history of radiation therapy 2002   Psoriasis    S/P thyroidectomy 07/2005   2 cm largest diameter (1 other tiny focus)/ i-131 rx 99 [facility] 08/2005   Thyroid  cancer (HCC)    Papillary Stage 1 - Dr Kassie   Vitamin B12 deficiency 2009   Vitamin D  deficiency 2009    Past Surgical History:  Procedure Laterality Date   BREAST LUMPECTOMY     BREAST LUMPECTOMY WITH RADIOACTIVE SEED AND SENTINEL LYMPH NODE BIOPSY Left 09/11/2016   Procedure: LEFT BREAST LUMPECTOMY WITH RADIOACTIVE SEED AND LEFT AXILLARY SENTINEL LYMPH NODE BIOPSY WITH BLUE DYE INJECTION;  Surgeon: Gail Favorite, MD;  Location: [facility];  Service: General;  Laterality: Left;   ESOPHAGEAL MANOMETRY N/A 10/08/2023   Procedure: MANOMETRY, ESOPHAGUS;  Surgeon: Federico Rosario BROCKS, MD;  Location: [facility];  Service: Gastroenterology;  Laterality: N/A;   IR FLUORO GUIDE PORT INSERTION RIGHT  09/13/2016   IR REMOVAL TUN ACCESS W/ PORT W/O FL MOD SED  12/30/2016   IR US  GUIDE VASC ACCESS RIGHT  09/13/2016   MASTECTOMY Right    Right   PORTACATH PLACEMENT N/A 09/11/2016   Procedure: ATTEMPTED INSERTION PORT-A-CATH WITH ULTRA SOUND GUIDANCE;  Surgeon: Gail Favorite, MD;  Location: [facility];  Service: General;  Laterality: N/A;   REDUCTION MAMMAPLASTY Left 2005   THYROIDECTOMY  2007    Prior to Admission medications   Medication Sig Start Date End Date Taking? Authorizing Provider  ALPRAZolam  (XANAX ) 1 MG tablet Take 1 tablet (1 mg total) by mouth 3 (three) times daily as needed for anxiety. 08/06/23   Plotnikov, Aleksei V, MD  anastrozole  (ARIMIDEX ) 1 MG tablet TAKE 1 TABLET BY MOUTH EVERY DAY 09/09/23   Causey, Morna Pickle, NP  Blood Glucose Monitoring Suppl DEVI 1 each by Does not apply route in the morning, at noon, and at bedtime. May substitute to any manufacturer covered by patient's insurance. 10/15/23   Plotnikov, Aleksei V, MD  clobetasol  ointment (TEMOVATE ) 0.05 % Apply 1 Application topically 2 (two) times daily. rash 10/15/23   Plotnikov, Aleksei  V, MD  clopidogrel  (PLAVIX ) 75 MG tablet Take 1 tablet (75 mg total) by mouth daily. 09/09/23   Plotnikov, Karlynn GAILS, MD  donepezil  (ARICEPT ) 5 MG tablet Take 1 tablet (5 mg total) by mouth at bedtime. Patient not taking: Reported on 10/28/2023 07/21/23   Plotnikov, Karlynn GAILS, MD  escitalopram  (LEXAPRO ) 10 MG tablet Take 1 tablet (10 mg total) by mouth daily. 10/15/23   Plotnikov, Aleksei V, MD  Glucose Blood (BLOOD GLUCOSE TEST STRIPS) STRP 1 each by In Vitro route in the morning, at noon, and at bedtime. May substitute to any manufacturer covered by  patient's insurance. 10/15/23 11/14/23  Plotnikov, Aleksei V, MD  Lancet Device MISC 1 each by Does not apply route in the morning, at noon, and at bedtime. May substitute to any manufacturer covered by patient's insurance. 10/15/23 11/14/23  Plotnikov, Karlynn GAILS, MD  Lancets Misc. MISC 1 each by Does not apply route in the morning, at noon, and at bedtime. May substitute to any manufacturer covered by patient's insurance. 10/15/23 11/14/23  Plotnikov, Aleksei V, MD  levothyroxine  (SYNTHROID ) 112 MCG tablet TAKE 1 TABLET BY MOUTH EVERY DAY 11/28/22   Plotnikov, Aleksei V, MD  metoprolol  succinate (TOPROL -XL) 25 MG 24 hr tablet Take 12.5 mg by mouth at bedtime. Patient not taking: Reported on 10/28/2023 06/03/23   [provider]  prednisoLONE acetate (PRED FORTE) 1 % ophthalmic suspension PLEASE SEE ATTACHED FOR DETAILED DIRECTIONS 04/15/23   [provider]  repaglinide  (PRANDIN ) 0.5 MG tablet Take 1 tablet (0.5 mg total) by mouth 3 (three) times daily before meals. 09/03/23   Plotnikov, Aleksei V, MD  rosuvastatin  (CRESTOR ) 20 MG tablet TAKE 1 TABLET BY MOUTH EVERY DAY 03/27/23   Plotnikov, Aleksei V, MD  triamcinolone  (KENALOG ) 0.025 % ointment Apply 1 Application topically 2 (two) times daily. 10/08/23   Mayer, Jodi R, NP  vitamin B-12 (CYANOCOBALAMIN ) 1000 MCG tablet Take 1,000 mcg by mouth daily.    [provider]    Current Outpatient Medications  Medication Sig Dispense Refill   ALPRAZolam  (XANAX ) 1 MG tablet Take 1 tablet (1 mg total) by mouth 3 (three) times daily as needed for anxiety. 90 tablet 2   anastrozole  (ARIMIDEX ) 1 MG tablet TAKE 1 TABLET BY MOUTH EVERY DAY 90 tablet 1   Blood Glucose Monitoring Suppl DEVI 1 each by Does not apply route in the morning, at noon, and at bedtime. May substitute to any manufacturer covered by patient's insurance. 1 each 0   clobetasol  ointment (TEMOVATE ) 0.05 % Apply 1 Application topically 2 (two) times daily. rash 120 g 1    clopidogrel  (PLAVIX ) 75 MG tablet Take 1 tablet (75 mg total) by mouth daily. 90 tablet 3   donepezil  (ARICEPT ) 5 MG tablet Take 1 tablet (5 mg total) by mouth at bedtime. (Patient not taking: Reported on 10/28/2023) 90 tablet 1   escitalopram  (LEXAPRO ) 10 MG tablet Take 1 tablet (10 mg total) by mouth daily. 30 tablet 5   Glucose Blood (BLOOD GLUCOSE TEST STRIPS) STRP 1 each by In Vitro route in the morning, at noon, and at bedtime. May substitute to any manufacturer covered by patient's insurance. 100 strip 0   Lancet Device MISC 1 each by Does not apply route in the morning, at noon, and at bedtime. May substitute to any manufacturer covered by patient's insurance. 1 each 0   Lancets Misc. MISC 1 each by Does not apply route in the morning, at noon, and at bedtime. May  substitute to any manufacturer covered by patient's insurance. 100 each 0   levothyroxine  (SYNTHROID ) 112 MCG tablet TAKE 1 TABLET BY MOUTH EVERY DAY 90 tablet 3   metoprolol  succinate (TOPROL -XL) 25 MG 24 hr tablet Take 12.5 mg by mouth at bedtime. (Patient not taking: Reported on 10/28/2023)     prednisoLONE acetate (PRED FORTE) 1 % ophthalmic suspension PLEASE SEE ATTACHED FOR DETAILED DIRECTIONS     repaglinide  (PRANDIN ) 0.5 MG tablet Take 1 tablet (0.5 mg total) by mouth 3 (three) times daily before meals. 90 tablet 11   rosuvastatin  (CRESTOR ) 20 MG tablet TAKE 1 TABLET BY MOUTH EVERY DAY 90 tablet 1   triamcinolone  (KENALOG ) 0.025 % ointment Apply 1 Application topically 2 (two) times daily. 30 g 0   vitamin B-12 (CYANOCOBALAMIN ) 1000 MCG tablet Take 1,000 mcg by mouth daily.     No current facility-administered medications for this visit.   Facility-Administered Medications Ordered in Other Visits  Medication Dose Route Frequency Provider Last Rate Last Admin   denosumab  (PROLIA ) injection 60 mg  60 mg Subcutaneous Once Crawford Morna Pickle, NP        Allergies as of 11/04/2023 - Review Complete 10/28/2023  Allergen  Reaction Noted   Pneumococcal vaccines Other (See Comments) 05/07/2016   Citalopram   07/15/2023   Metformin  and related  10/31/2023   Sulfa antibiotics Other (See Comments) 09/05/2016   Sulfacetamide sodium-sulfur      Sulfamethoxazole-trimethoprim  03/19/2017   Tape Itching, Dermatitis, and Rash 09/05/2016   Tegaderm ag mesh [silver] Itching and Rash 09/18/2016    Family History  Problem Relation Age of Onset   Stroke Mother    Allergies Mother    Asthma Mother    Clotting disorder Mother    Heart disease Father 23       MI   Allergies Sister    Asthma Sister    Breast cancer Sister 1       Lymphoma 65; currently 86   Lymphoma Sister    Hyperparathyroidism Sister    Asthma Brother    Leukemia Brother        dx 77s   Allergies Daughter    Asthma Daughter    Allergies Sister    Breast cancer Sister 49       currently 61   Kidney cancer Paternal Aunt        kidney ca; deceased 45   Liver cancer Paternal Uncle        unk. 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  Other Topics Concern  Not on file  Social History Narrative   GI - Dr Luis   Depression - Dr Vincente   GYN - Dr JONETTA Fu      Social Drivers of Health   Financial Resource Strain: Low Risk   (08/28/2023)   Overall Financial Resource Strain (CARDIA)    Difficulty of Paying Living Expenses: Not hard at all  Food Insecurity: No Food Insecurity (08/28/2023)   Hunger Vital Sign    Worried About Running Out of Food in the Last Year: Never true    Ran Out of Food in the Last Year: Never true  Transportation Needs: No Transportation Needs (08/28/2023)   PRAPARE - Administrator, Civil Service (Medical): No    Lack of Transportation (Non-Medical): No  Physical Activity: Inactive (08/28/2023)   Exercise Vital Sign    Days of Exercise per Week: 0 days    Minutes of Exercise per Session: 0 min  Stress: No Stress Concern Present (08/28/2023)   [employer] of Occupational Health - Occupational Stress Questionnaire    Feeling of Stress: Only a little  Social Connections: Socially Integrated (08/28/2023)   Social Connection and Isolation Panel    Frequency of Communication with Friends and Family: More than three times a week    Frequency of Social Gatherings with Friends and Family: More than three times a week    Attends Religious Services: More than 4 times per year    Active Member of [employer] or Organizations: Yes    Attends Engineer, structural: More than 4 times per year    Marital Status: Married  Catering manager Violence: Not At Risk (08/28/2023)   Humiliation, Afraid, Rape, and Kick questionnaire    Fear of Current or Ex-Partner: No    Emotionally Abused: No    Physically Abused: No    Sexually Abused: No    Review of Systems:  All other review of systems negative except as mentioned in the HPI.  Physical Exam: Vital signs There were no vitals taken for this visit.  General:   Alert,  Well-developed, well-nourished, pleasant and cooperative in NAD Airway:  Mallampati  Lungs:  Clear throughout to auscultation.   Heart:  Regular rate and rhythm; no murmurs, clicks, rubs,  or gallops. Abdomen:  Soft, nontender and nondistended. Normal bowel sounds.    Neuro/Psych:  Normal mood and affect. A and O x 3  Inocente Hausen, MD [facility]

## 2023-11-04 ENCOUNTER — Telehealth: Payer: Self-pay | Admitting: Pediatrics

## 2023-11-04 ENCOUNTER — Encounter: Admitting: Pediatrics

## 2023-11-04 ENCOUNTER — Telehealth: Payer: Self-pay | Admitting: Internal Medicine

## 2023-11-04 NOTE — Telephone Encounter (Signed)
 Inbound call from stating that she was canceling her procedure today due to her having an upset stomach. Patient is now requesting that she be schedule to have a colonosocopy and EGD at the same time. Patient is requesting a call back from the nurse to further advise her on this information. Please advise.

## 2023-11-04 NOTE — Telephone Encounter (Signed)
 Good Morning Dr. Suzann,  I received a call from this patient stating that she would like to cancel her procedure due to her having an upset stomach this morning. Please advise.   Thank you.

## 2023-11-04 NOTE — Telephone Encounter (Signed)
 Attempted to call pt to discuss request for colonoscopy to be scheduled with EGD. There is no mention of need for colonoscopy in note from GI office visit. No answer and phone rings continuously with no voicemail, unable to leave message.   MD can you advise if this pt should be rescheduled for and Endo/colon or just and EGD?

## 2023-11-04 NOTE — Telephone Encounter (Signed)
 Spoke with patient & she would like to add on colonoscopy with her egd when rescheduling. Last colon was in 2020 & currently no recall. She is having occasional diarrhea & lower abdominal discomfort, which she is unsure if it could be r/t starting metformin . Medication has been discontinued, but still having symptoms. Advised her due to symptoms & age,to set up an OV to discuss further. OV scheduled for 9/30 at 1:30 pm with Deanna, NP.

## 2023-11-06 ENCOUNTER — Ambulatory Visit: Admitting: Internal Medicine

## 2023-11-06 ENCOUNTER — Other Ambulatory Visit: Payer: Self-pay | Admitting: Internal Medicine

## 2023-11-10 ENCOUNTER — Ambulatory Visit: Payer: Self-pay

## 2023-11-10 NOTE — Telephone Encounter (Signed)
 FYI Only or Action Required?: Action required by provider: request for appointment, medication refill request, and clinical question for provider.  Patient was last seen in primary care on 11/03/2023 by Plotnikov, Karlynn GAILS, MD.  Called Nurse Triage reporting Depression and Medication Problem.  Symptoms began today.  Interventions attempted: Nothing.  Symptoms are: gradually worsening.  Triage Disposition: Go to ED Now (Notify PCP)  Patient/caregiver understands and will follow disposition?: Yes   Copied from CRM [id number]. Topic: Clinical - Red Word Triage >> Nov 10, 2023  4:48 PM Winona R wrote: Pt states she's feeling depressed about her diabetes and she believes her ALPRAZolam  (XANAX ) 1 MG tablet isnt helping and not helping her anxiety. Reason for Disposition  [1] Depression AND [2] unable to do any of normal activities (e.g., self-care, school, work; in comparison to baseline)  Answer Assessment - Initial Assessment Questions Advised UC/ED today. Nurse provided [facility] information, patient reports will have husband to drive her to [facility] or UC.  Patient needs assistance with applying DM sensor. Patient requesting medication for depression and Call Back.  1. CONCERN: What happened that made you call today?     Feeling depressed from diagnosis diabetes II and osteoarthritis. Alprazolam  and diabetic senor/machine. Alprazolam  is not helping. stopped Metformin  and iron , doctor already knows stopped taking the med.  2. DEPRESSION SYMPTOM SCREENING: How are you feeling overall? (e.g., decreased energy, increased sleeping or difficulty sleeping, difficulty concentrating, feelings of sadness, guilt, hopelessness, or worthlessness)     Hopelessness, I see what my sister goes through, feeling depressed barely got out to bed to eat or do anything. Been having nightmares and don't want to tell my kids I'm diabetic, until I can see something that could be done.  3. RISK OF HARM - SUICIDAL  IDEATION:  Do you ever have thoughts of hurting or killing yourself?  (e.g., yes, no, no but preoccupation with thoughts about death)     no 4. RISK OF HARM - HOMICIDAL IDEATION:  Do you ever have thoughts of hurting or killing someone else?  (e.g., yes, no, no but preoccupation with thoughts about death)     no 5. FUNCTIONAL IMPAIRMENT: How have things been going for you overall? Have you had more difficulty than usual doing your normal daily activities?  (e.g., better, same, worse; self-care, school, work, interactions)     Don't have the desire, hard from me to get up and take a bath 6. SUPPORT: Who is with you now? Who do you live with? Do you have family or friends who you can talk to?      husband 7. THERAPIST: Do you have a counselor or therapist? If Yes, ask: What is their name?     no 8. STRESSORS: Has there been any new stress or recent changes in your life?     Newly diagnosed with DM II 9. ALCOHOL USE OR SUBSTANCE USE (DRUG USE): Do you drink alcohol or use any illegal drugs?     no 10. OTHER: Do you have any other physical symptoms right now? (e.g., fever)       Hurting in stomach that's the reason why I'm seeing GI tomorrow.  Protocols used: Depression-A-AH

## 2023-11-11 ENCOUNTER — Ambulatory Visit: Payer: Self-pay

## 2023-11-11 ENCOUNTER — Ambulatory Visit: Admitting: Gastroenterology

## 2023-11-11 ENCOUNTER — Telehealth: Payer: Self-pay

## 2023-11-11 VITALS — BP 92/78 | HR 117 | Ht 64.0 in | Wt 145.5 lb

## 2023-11-11 DIAGNOSIS — K219 Gastro-esophageal reflux disease without esophagitis: Secondary | ICD-10-CM | POA: Diagnosis not present

## 2023-11-11 DIAGNOSIS — K224 Dyskinesia of esophagus: Secondary | ICD-10-CM

## 2023-11-11 DIAGNOSIS — D509 Iron deficiency anemia, unspecified: Secondary | ICD-10-CM | POA: Diagnosis not present

## 2023-11-11 DIAGNOSIS — R194 Change in bowel habit: Secondary | ICD-10-CM | POA: Diagnosis not present

## 2023-11-11 NOTE — Progress Notes (Signed)
 Agree with assessment and plan as outlined.

## 2023-11-11 NOTE — Telephone Encounter (Signed)
 Copied from CRM [id number]. Topic: Clinical - Medication Question >> Nov 11, 2023  2:40 PM [name] [name] wrote: Reason for CRM: Patient called in stating that she had a visit with her [place] doctor and they advised her to ask her PCP could she be prescribed a stronger dosage of a medication for her anxiety and depression that she is dealing with due to the issues that she is having with her diabetes and eczema and other issues. She stated that she is already taking alprazolam  however it is not helping her.

## 2023-11-11 NOTE — Telephone Encounter (Signed)
 Patient returned call. Patient reports went to pharmacy for assistance with apply DM sensor; did not help apply, only verbally explained; pt needs assistance with applying DM device/ education.  Patient reports today noticed darkish green stools, reports taking iron , no new symptoms.  Scheduled 11/12/23. Patient reports feels better since taking Escitalopram  2 hours ago.  Advised [facility]/ED if symptoms worsen.

## 2023-11-11 NOTE — Progress Notes (Signed)
 Chief Complaint: follow-up eso dysphagia Primary GI Doctor: Dr. Federico  HPI: 83 year old female history of breast cancer s/p chemo and radiation, papillary thyroid  cancer s/p thyroidectomy, hypertension, hyperlipidemia  Last seen in GI office on 12/30/22 by [facility], PA for RLQ pain and dysphagia. Barium swallow ordered. EGD scheduled 01/31/24- patient did not stop Plavix , rescheduled for 03/05/23. Not done. Patient scheduled for esophageal manometry on 10/08/23. 11/04/23 Cancelled EGD scheduled due to upset stomach.   Interval History    Patient presents for follow-up.  Patient has continued with esophageal dysphagia. We discussed her manometry results, She does not recall the conversation with Dr. Federico and recommendations to take 2 Altoid mints before each meal to see if this helps with her symptoms.Provided information to her again today.  The last two visits I have been with patient she has had a lot of memory issues with things we have discussed and recommended. Her EGD has been rescheduled on multiple occasions due to forgetting instructions to stop certain medications.     Patient also complains of altered bowel habits.  Patient has history of chronic constipation but recently was diagnosed with diabetes and started on metformin .  Patient starts shortly after starting the metformin  she experienced lower abdominal pain and diarrhea. The metformin  was discontinued and she was placed on Prandin  2 mg TID prn.      She had formed bowel movement today. The abdominal discomfort has improved, but still occurs intermittently. She wears a abdominal binder for back pain. She reports walking makes the pain worse. She is pending physical therapy and tells me she has had balance problems recent;ly.  She notes her anxiety and depression has also recently increased with racing heart rate and hands are jittery. She notes her motivation to go out and do things has decreased. She reached out to her PCP to  discuss potentially adjusting her medications.   She has iron  deficiency and taking daily iron  supplement.   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)   HTN (hypertension)    Hyperlipidemia    Hypothyroidism    Personal history of chemotherapy 2002   Personal history of radiation therapy 2002   Psoriasis    S/P thyroidectomy 07/2005   2 cm largest diameter (1 other tiny focus)/ i-131 rx 99 [facility] 08/2005   Thyroid  cancer (HCC)    Papillary Stage 1 - Dr Kassie   Vitamin B12 deficiency 2009   Vitamin D  deficiency 2009    Past Surgical History:  Procedure Laterality Date   BREAST LUMPECTOMY     BREAST LUMPECTOMY WITH RADIOACTIVE SEED AND SENTINEL LYMPH NODE BIOPSY Left 09/11/2016   Procedure: LEFT BREAST LUMPECTOMY WITH RADIOACTIVE SEED AND LEFT AXILLARY SENTINEL LYMPH NODE BIOPSY WITH BLUE DYE INJECTION;  Surgeon: Gail Favorite, MD;  Location: [facility];  Service: General;  Laterality: Left;   ESOPHAGEAL MANOMETRY N/A 10/08/2023   Procedure: MANOMETRY, ESOPHAGUS;  Surgeon: Federico Rosario BROCKS, MD;  Location: [facility];  Service: Gastroenterology;  Laterality: N/A;   IR FLUORO GUIDE PORT  INSERTION RIGHT  09/13/2016   IR REMOVAL TUN ACCESS W/ PORT W/O FL MOD SED  12/30/2016   IR US  GUIDE VASC ACCESS RIGHT  09/13/2016   MASTECTOMY Right    Right   PORTACATH PLACEMENT N/A 09/11/2016   Procedure: ATTEMPTED INSERTION PORT-A-CATH WITH ULTRA SOUND GUIDANCE;  Surgeon: Gail Favorite, MD;  Location: [facility];  Service: General;  Laterality: N/A;   REDUCTION MAMMAPLASTY Left 2005   THYROIDECTOMY   2007    Current Outpatient Medications  Medication Sig Dispense Refill   ALPRAZolam  (XANAX ) 1 MG tablet Take 1 tablet (1 mg total) by mouth 3 (three) times daily as needed for anxiety. 90 tablet 2   anastrozole  (ARIMIDEX ) 1 MG tablet TAKE 1 TABLET BY MOUTH EVERY DAY 90 tablet 1   Blood Glucose Monitoring Suppl DEVI 1 each by Does not apply route in the morning, at noon, and at bedtime. Rochella Benner substitute to any manufacturer covered by patient's insurance. 1 each 0   clobetasol  ointment (TEMOVATE ) 0.05 % Apply 1 Application topically 2 (two) times daily. rash 120 g 1   clopidogrel  (PLAVIX ) 75 MG tablet Take 1 tablet (75 mg total) by mouth daily. 90 tablet 3   Continuous Glucose Receiver (DEXCOM G7 RECEIVER) DEVI Use as directed 1 each 1   Continuous Glucose Sensor (DEXCOM G7 SENSOR) MISC Replace q 10 days 3 each 11   donepezil  (ARICEPT ) 5 MG tablet Take 1 tablet (5 mg total) by mouth at bedtime. 90 tablet 1   escitalopram  (LEXAPRO ) 10 MG tablet TAKE 1 TABLET BY MOUTH EVERY DAY 90 tablet 2   Glucose Blood (BLOOD GLUCOSE TEST STRIPS) STRP 1 each by In Vitro route in the morning, at noon, and at bedtime. Giara Mcgaughey substitute to any manufacturer covered by patient's insurance. 100 strip 0   Lancet Device MISC 1 each by Does not apply route in the morning, at noon, and at bedtime. Zaia Carre substitute to any manufacturer covered by patient's insurance. 1 each 0   Lancets Misc. MISC 1 each by Does not apply route in the morning, at noon, and at bedtime. Nicolas Sisler substitute to any manufacturer covered by patient's insurance. 100 each 0   levothyroxine  (SYNTHROID ) 112 MCG tablet TAKE 1 TABLET BY MOUTH EVERY DAY 90 tablet 3   Loperamide HCl (ANTI-DIARRHEAL PO) Take 1 tablet by mouth as needed.     metoprolol  succinate (TOPROL -XL) 25 MG 24 hr tablet Take 12.5 mg by mouth at bedtime.     prednisoLONE acetate (PRED FORTE) 1 % ophthalmic suspension PLEASE SEE ATTACHED FOR DETAILED DIRECTIONS     repaglinide  (PRANDIN ) 2 MG tablet  Take 1 tablet (2 mg total) by mouth 3 (three) times daily before meals. 90 tablet 11   rosuvastatin  (CRESTOR ) 20 MG tablet TAKE 1 TABLET BY MOUTH EVERY DAY 90 tablet 1   triamcinolone  (KENALOG ) 0.025 % ointment Apply 1 Application topically 2 (two) times daily. 30 g 0   vitamin B-12 (CYANOCOBALAMIN ) 1000 MCG tablet Take 1,000 mcg by mouth daily.     No current facility-administered medications for this visit.   Facility-Administered Medications Ordered in Other Visits  Medication Dose Route Frequency Provider Last Rate Last Admin   denosumab  (PROLIA ) injection 60 mg  60 mg Subcutaneous Once Causey, Lindsey Cornetto, NP        Allergies as of 11/11/2023 - Review Complete 11/11/2023  Allergen Reaction Noted   Pneumococcal vaccines Other (See Comments) 05/07/2016   Citalopram   07/15/2023   Metformin  and related  10/31/2023   Sulfa antibiotics Other (See Comments) 09/05/2016   Sulfacetamide sodium-sulfur      Sulfamethoxazole-trimethoprim  03/19/2017   Tape Itching, Dermatitis, and Rash 09/05/2016   Tegaderm ag mesh [silver] Itching and Rash 09/18/2016    Family History  Problem Relation Age of Onset   Stroke Mother    Allergies Mother    Asthma Mother    Clotting disorder Mother    Heart disease Father 24       MI   Allergies Sister    Asthma Sister    Breast cancer Sister 41       Lymphoma 13; currently 100   Lymphoma Sister    Hyperparathyroidism Sister    Asthma Brother    Leukemia Brother        dx 51s   Allergies Daughter    Asthma Daughter    Allergies Sister    Breast cancer Sister 26       currently 55   Kidney cancer Paternal Aunt        kidney ca; deceased 63   Liver cancer Paternal Uncle        unk. primary (liver)   Throat cancer Maternal Grandmother        deceased 74   Lung cancer Maternal Grandfather        deceased 30   Pancreatic cancer Paternal Grandfather        deceased 69   Cancer Paternal Aunt        abdominal; deceased 102    Review of  Systems:    Constitutional: No weight loss, fever, chills, weakness or fatigue HEENT: Eyes: No change in vision               Ears, Nose, Throat:  No change in hearing or congestion Skin: No rash or itching Cardiovascular: No chest pain, chest pressure or palpitations   Respiratory: No SOB or cough Gastrointestinal: See HPI and otherwise negative Genitourinary: No dysuria or change in urinary frequency Neurological: No headache, dizziness or syncope Musculoskeletal: No new muscle or joint pain Hematologic: No bleeding or bruising Psychiatric: No history of depression or anxiety    Physical Exam:  Vital signs: BP 92/78   Pulse (!) 117   Ht 5' 4 (1.626 m)   Wt 145 lb 8 oz (66 kg)   SpO2 97%   BMI 24.98 kg/m   Constitutional:   Pleasant female appears to be in NAD, Well developed, Well nourished, alert and cooperative Throat: Oral cavity and pharynx without inflammation, swelling or lesion.  Respiratory: Respirations even and unlabored. Lungs clear to auscultation bilaterally.   No wheezes, crackles, or rhonchi.  Cardiovascular: Normal S1, S2. Regular rate and rhythm. No peripheral edema, cyanosis or pallor.  Gastrointestinal:  Soft, nondistended, nontender. No rebound or guarding. Normal bowel sounds. No appreciable masses or hepatomegaly. Rectal:  Not performed.  Msk:  Symmetrical without gross deformities. Without edema, no deformity or joint abnormality.  Neurologic:  Alert and  oriented x4;  grossly normal neurologically.  Skin:   Dry and intact without significant lesions or rashes.  RELEVANT LABS AND IMAGING: CBC    Latest Ref Rng & Units 10/16/2023    6:30 PM 09/29/2023   10:49 AM 09/02/2023    4:19 PM  CBC  WBC 4.0 - 10.5 K/uL 7.4  6.6  5.9   Hemoglobin 12.0 - 15.0 g/dL 88.8  89.5  9.8   Hematocrit 36.0 - 46.0 % 38.3  35.2  32.2   Platelets  150 - 400 K/uL 364  343  316.0      CMP     Latest Ref Rng & Units 10/16/2023    6:30 PM 09/29/2023   10:49 AM 09/02/2023     4:19 PM  CMP  Glucose 70 - 99 mg/dL 885  853  852   BUN 8 - 23 mg/dL 27  20  25    Creatinine 0.44 - 1.00 mg/dL 8.72  8.62  8.61   Sodium 135 - 145 mmol/L 138  141  140   Potassium 3.5 - 5.1 mmol/L 4.1  4.4  3.9   Chloride 98 - 111 mmol/L 104  107  106   CO2 22 - 32 mmol/L 19  25  27    Calcium  8.9 - 10.3 mg/dL 89.7  89.5  9.5   Total Protein 6.5 - 8.1 g/dL  6.7  6.9   Total Bilirubin 0.0 - 1.2 mg/dL  0.6  0.6   Alkaline Phos 38 - 126 U/L  51  52   AST 15 - 41 U/L  20  18   ALT 0 - 44 U/L  15  17      Lab Results  Component Value Date   TSH 3.53 03/20/2023   PREVIOUS GI WORKUP    Colonoscopy 04/2018 for change in bowel habits - Stool in the entire examined colon. This limited a meaningful evaluation for both small and large polyps.  - The examination was otherwise normal on direct and retroflexion views.  - No specimens collected.  - No source recent symptoms identified on this examination.  01/03/23 ESOPHAGUS/BARIUM SWALLOW/TABLET STUDY  IMPRESSION: 1.  Patulous esophagus   2. Tertiary contractions, poor primary stripping wave contributing to severe esophageal dysmotility likely the cause of inability of 13mm tablet to pass through to the stomach.   3.  Moderate gastroesophageal reflux.  CT chest abdomen pelvis with contrast for unintentional weight loss January 2024 showed no findings of recurrent or metastatic disease.  Diverticulosis without diverticulitis.  Small liver cyst stable compared to CT scan in 2019.  Gallbladder unremarkable.   10/08/23 Esophageal manometry -which showed that she has distal esophageal spasm, which is an esophageal motility disorder where the esophagus contracts prematurely.    Assessment:    Encounter Diagnoses  Name Primary?   Gastroesophageal reflux disease, unspecified whether esophagitis present Yes   Esophageal dysmotility    Iron  deficiency anemia, unspecified iron  deficiency anemia type    Altered bowel habits    83 year old  female patient that presents for follow-up.  Patient has had issues with esophageal dysphagia.  Swallow study shows patulous esophagus gas and dietary contractions and indicating severe esophageal dysmotility and moderate GERD.  Patient restarted on pantoprazole  and recommended for esophageal manometry and an EGD.  Esophageal manometry showed distal esophageal spasm with recommendations to try Altoid peppermint before meals.  Patient canceled her EGD due to upset stomach.  Patient cannot recall having conversation with Dr. Federico about the manometry.  I wrote it both on a piece of paper as well as on her discharge paperwork.  Recommended she schedule the EGD today however she states with her current depression and anxiety she would like to hold off for now.     History of iron  deficiency anemia and currently taking oral iron  supplement. Hgb stable 10.4>11.1    For the altered bowel habits I suspect the new addition of diabetes medication has caused more issues with diarrhea.  Recommended the patient continue her over-the-counter Metamucil.  She can use the over-the-counter MiraLAX as needed for constipation as needed.  She inquired if she needed a colonoscopy.  Her last colonoscopy in March 2020 with no recommendations for repeat due to age.    Plan: -Recommended EGD, she would like to see PCP first about anxiety and depression.  The risks and benefits of EGD with possible biopsies and esophageal dilation were discussed with the patient who agrees to proceed.  -Continue Pantoprazole  40 mg po daily  -Continue Metamucil 1 tsp po daily  -recommend following up with PCP about depression and anxiety -Follow-up with me in December   Thank you for the courtesy of this consult. Please call me with any questions or concerns.   Essa Wenk, FNP-C [facility] 11/11/2023, 4:45 PM  Cc: Plotnikov, Aleksei V, MD

## 2023-11-11 NOTE — Telephone Encounter (Addendum)
 3rd attempt to contact patient, no answer, LVMTCB. Will go ahead and route to clinci for f/u 2nd attempt to contact patient. No answer, LVMTCB 1st attempt to contact patient. No answer, LVMTCB  Copied from CRM [id number]. Topic: Clinical - Red Word Triage >> Nov 11, 2023  1:09 PM Zy'onna H wrote: Red Word that prompted transfer to Nurse Triage:  Patient is upset because found out she has Diabetes - She also stated that she is having worsening symptoms of Depression & Anxiety.  **Stated she is going to ask to the pharmacy first to see if they have any OTC that can be administered do aid her with current depression symptoms.   I tried to transf to NT, but she stated she spoke with a nurse yesterday, I consulted with patient regarding that encounter ( E2C2Nurse Triage with Windell Albino HERO, RN)  ** Patient is requesting a callback  ASAP**

## 2023-11-11 NOTE — Patient Instructions (Addendum)
 Problems swallowing Patient try to take 2 Altoid mints before each meal to see if this helps with her symptoms since peppermint can help relax smooth muscle.  Altered bowel habits Continue Metamucil 1 tsp po daily  GERD Continue pantoprazole  40 mg po daily   Call office when ready to schedule EGD endoscopy  _______________________________________________________  If your blood pressure at your visit was 140/90 or greater, please contact your primary care physician to follow up on this.  _______________________________________________________  If you are age 21 or older, your body mass index should be between 23-30. Your Body mass index is 24.98 kg/m. If this is out of the aforementioned range listed, please consider follow up with your Primary Care Provider.  If you are age 86 or younger, your body mass index should be between 19-25. Your Body mass index is 24.98 kg/m. If this is out of the aformentioned range listed, please consider follow up with your Primary Care Provider.   ________________________________________________________  The [facility] providers would like to encourage you to use MYCHART to communicate with providers for non-urgent requests or questions.  Due to long hold times on the telephone, sending your provider a message by [facility] may be a faster and more efficient way to get a response.  Please allow 48 business hours for a response.  Please remember that this is for non-urgent requests.  _______________________________________________________  [facility] is using a team-based approach to care.  Your team is made up of your doctor and two to three APPS. Our APPS (Nurse Practitioners and Physician Assistants) work with your physician to ensure care continuity for you. They are fully qualified to address your health concerns and develop a treatment plan. They communicate directly with your gastroenterologist to care for you. Seeing the Advanced Practice  Practitioners on your physician's team can help you by facilitating care more promptly, often allowing for earlier appointments, access to diagnostic testing, procedures, and other specialty referrals.   Thank you for trusting me with your gastrointestinal care. Deanna May, FNP-C

## 2023-11-12 ENCOUNTER — Telehealth: Payer: Self-pay

## 2023-11-12 ENCOUNTER — Ambulatory Visit: Payer: Self-pay

## 2023-11-12 ENCOUNTER — Telehealth: Payer: Self-pay | Admitting: Gastroenterology

## 2023-11-12 ENCOUNTER — Ambulatory Visit: Admitting: Internal Medicine

## 2023-11-12 NOTE — Telephone Encounter (Signed)
 Patient cancelled today's appointment because she picked up and started her new medication & she just wants to come in tomorrow for help with her new diabetic sensor.  FYI Only or Action Required?: Action required by provider: update on patient condition.  Patient was last seen in primary care on 11/03/2023 by Plotnikov, Karlynn GAILS, MD.  Called Nurse Triage reporting Cancelling appointment for today/update.   Interventions attempted: Prescription medications: patient started her new medication escitalopram  and states it is helping a lot.  Symptoms are: patient states she is a lot better.  Triage Disposition: Information or Advice Only Call  Patient/caregiver understands and will follow disposition?: Yes       Reason for Disposition  [1] Follow-up call to recent contact AND [2] information only call, no triage required  Answer Assessment - Initial Assessment Questions Patient states she got her prescription yesterday and her sensor. Patient thought that she was speaking with someone setting up an appointment with the gastroenterologist but it was one of our Triage Nurses & she hadnt taken the new medication yet. Patient took her medication(escitalopram ) last night for depression and states within 3-3.5 hours she felt a lot better/at ease. She states this was a new medication and her hand shaking had stopped and she felt more relaxed and felt more like herself Patient states that today she feels a lot better but she just needs assistance with her diabetic sensor. Patient states that she would rather come to her appointment She states that she had leg cramps last night and didn't sleep well--she states got up and put icy hot on them and they felt better. She states that this has been going on ever since she had chemo and it was nothing new. Patient wants to cancel this appointment today because she wants to try this medication for at least a week before seeing if it helps/following up  with her PCP.  Patient is advised that if anything changes to give us  a call. Patient is advised that if anything worsens to go to the Emergency Room. Patient verbalized understanding.  Protocols used: Information Only Call - No Triage-A-AH

## 2023-11-12 NOTE — Telephone Encounter (Signed)
 Copied from CRM #[id number]. Topic: Clinical - Medication Question >> Nov 12, 2023  3:32 PM Shereese L wrote: Reason for CRM: patient stated that she has a medication Telmisartan  and wanted to know if medication is her cancer medication. Patient would like a call back to verify the use of the medication

## 2023-11-12 NOTE — Telephone Encounter (Signed)
 Pt stated that she started taking iron  last week and now her stools are dark. Pt was notified that this is a common side effect.  Pt verbalized understanding with all questions answered.

## 2023-11-12 NOTE — Telephone Encounter (Signed)
 Inbound call from patient stating she had a bowel movement and it was very dark green. Patient is unsure if it is a result from iron  supplement. Patient is requesting a call back. Please advise, thank you

## 2023-11-12 NOTE — Telephone Encounter (Signed)
 Copied from CRM #[id number]. Topic: General - Other >> Nov 12, 2023  7:36 AM Carlyon D wrote: Reason for CRM: Pt calling wanting to cancel her appt for today. Appt for today was from Red word NT 9/30. Pt states the meds pcp sent into pharmacy did help her after one pill yesterday. Pt appt today is from being Nurse triaged on 9/30. Pt is asking that appt be canceled. Please cancel appt if ok? If pt needs to still come in please contact here, she says she has nurse visit tomorrow and appt with pcp on 10/22. Pt does not need appt.

## 2023-11-12 NOTE — Telephone Encounter (Signed)
 Copied from CRM [id number]. Topic: Clinical - Medication Question >> Nov 12, 2023  3:44 PM Anairis L wrote: Reason for CRM: [name] [name] is calling because she has telmisartan  20mg  and does need the medication and wanted to know if she can donate it to someone who can use it.   Please advise. Thank you

## 2023-11-13 ENCOUNTER — Ambulatory Visit: Payer: Self-pay

## 2023-11-13 ENCOUNTER — Ambulatory Visit

## 2023-11-13 NOTE — Telephone Encounter (Signed)
 FYI Only or Action Required?: FYI only for provider.  Patient was last seen in primary care on 11/03/2023 by Plotnikov, Karlynn GAILS, MD.  Called Nurse Triage reporting Diarrhea.  Symptoms began several months ago.  Interventions attempted: OTC medications: Imodium.  Symptoms are: stable.  Triage Disposition: See PCP Within 2 Weeks  Patient/caregiver understands and will follow disposition?: Yes                                   Reason for Disposition  Diarrhea is a chronic symptom (recurrent or ongoing AND present > 4 weeks)  Answer Assessment - Initial Assessment Questions 1. DIARRHEA SEVERITY: How bad is the diarrhea? How many more stools have you had in the past 24 hours than normal?      2 x  2. ONSET: When did the diarrhea begin?      On and off since starting metformin , estimates starting in July/August, states she stopped taking metformin  3. STOOL DESCRIPTION:  How loose or watery is the diarrhea? What is the stool color? Is there any blood or mucous in the stool?     Green, states GI specialist advised this was normal 4. VOMITING: Are you also vomiting? If Yes, ask: How many times in the past 24 hours?      Denies 5. ABDOMEN PAIN: Are you having any abdomen pain? If Yes, ask: What does it feel like? (e.g., crampy, dull, intermittent, constant)      Denies  7. ORAL INTAKE: If vomiting, Have you been able to drink liquids? How much liquids have you had in the past 24 hours?     States she tries to drink a lot of water 8. HYDRATION: Any signs of dehydration? (e.g., dry mouth [not just dry lips], too weak to stand, dizziness, new weight loss) When did you last urinate?     Denies weakness 11. OTHER SYMPTOMS: Do you have any other symptoms? (e.g., fever, blood in stool)     Dizziness when first getting up, denies fever 12. PREGNANCY: Is there any chance you are pregnant? When was your last menstrual period?      N/A    Patient originally called in to cancel her appointment today because she has been having diarrhea. Appointment was cancelled prior to transfer to this RN.  Protocols used: [facility]

## 2023-11-13 NOTE — Telephone Encounter (Signed)
 First attempt; no answer.  Patient originally called in to Cancel her appointment for Today - 10.02.2025. Patient cancelled her appointment due to falling Ill with Diarrhea.

## 2023-11-16 ENCOUNTER — Encounter: Payer: Self-pay | Admitting: Internal Medicine

## 2023-11-17 NOTE — Telephone Encounter (Signed)
 Patient scheduled for ROV 11/19/23

## 2023-11-17 NOTE — Telephone Encounter (Signed)
 Called and spoke with patient. Informed her that we unfortunately do not take in any donated medications. She stated she had tried to reach out to her pharmacy to see if they would take in unused meds and they said no

## 2023-11-17 NOTE — Telephone Encounter (Signed)
Please schedule office visit.  Thank you.

## 2023-11-19 ENCOUNTER — Encounter: Payer: Self-pay | Admitting: Internal Medicine

## 2023-11-19 ENCOUNTER — Telehealth: Payer: Self-pay

## 2023-11-19 ENCOUNTER — Ambulatory Visit (INDEPENDENT_AMBULATORY_CARE_PROVIDER_SITE_OTHER): Admitting: Internal Medicine

## 2023-11-19 VITALS — BP 110/68 | HR 107 | Temp 97.9°F

## 2023-11-19 DIAGNOSIS — I25119 Atherosclerotic heart disease of native coronary artery with unspecified angina pectoris: Secondary | ICD-10-CM | POA: Diagnosis not present

## 2023-11-19 DIAGNOSIS — Z7984 Long term (current) use of oral hypoglycemic drugs: Secondary | ICD-10-CM

## 2023-11-19 DIAGNOSIS — F419 Anxiety disorder, unspecified: Secondary | ICD-10-CM | POA: Diagnosis not present

## 2023-11-19 DIAGNOSIS — E538 Deficiency of other specified B group vitamins: Secondary | ICD-10-CM

## 2023-11-19 DIAGNOSIS — R270 Ataxia, unspecified: Secondary | ICD-10-CM

## 2023-11-19 DIAGNOSIS — E119 Type 2 diabetes mellitus without complications: Secondary | ICD-10-CM | POA: Diagnosis not present

## 2023-11-19 MED ORDER — EMPAGLIFLOZIN 10 MG PO TABS: 10.0000 mg | ORAL_TABLET | Freq: Every day | ORAL | 11 refills | Status: AC

## 2023-11-19 NOTE — Assessment & Plan Note (Signed)
 On B12

## 2023-11-19 NOTE — Telephone Encounter (Signed)
 Copied from CRM [id number]. Topic: General - Other >> Nov 19, 2023  3:31 PM Macario HERO wrote: Reason for CRM: Patient called said that her provider recommended a yogurt and she is calling to ask what exactly it was. Requesting a call back from the office.

## 2023-11-19 NOTE — Patient Instructions (Signed)
 Try Kefir

## 2023-11-19 NOTE — Assessment & Plan Note (Signed)
 Worse Start Lexapro  10 mg/d Xanax  prn  Potential benefits of a long term benzodiazepines  use as well as potential risks  and complications were explained to the patient and were aknowledged.

## 2023-11-19 NOTE — Assessment & Plan Note (Signed)
 No angina On Plavix, Crestor, Toprol F/u w/Dr Anne Fu

## 2023-11-19 NOTE — Progress Notes (Signed)
 Subjective:  Patient ID: [name] [name], female    DOB: Jan 02, 1941  Age: 83 y.o. MRN: [id number]  CC: No chief complaint on file.   HPI [name] [name] presents for side effects w/SSRI C/o OA, anxiety, fatigue, abd bloating, DM  Outpatient Medications Prior to Visit  Medication Sig Dispense Refill   ALPRAZolam  (XANAX ) 1 MG tablet Take 1 tablet (1 mg total) by mouth 3 (three) times daily as needed for anxiety. 90 tablet 2   anastrozole  (ARIMIDEX ) 1 MG tablet TAKE 1 TABLET BY MOUTH EVERY DAY 90 tablet 1   Blood Glucose Monitoring Suppl DEVI 1 each by Does not apply route in the morning, at noon, and at bedtime. May substitute to any manufacturer covered by patient's insurance. 1 each 0   clobetasol  ointment (TEMOVATE ) 0.05 % Apply 1 Application topically 2 (two) times daily. rash 120 g 1   clopidogrel  (PLAVIX ) 75 MG tablet Take 1 tablet (75 mg total) by mouth daily. 90 tablet 3   Continuous Glucose Receiver (DEXCOM G7 RECEIVER) DEVI Use as directed 1 each 1   Continuous Glucose Sensor (DEXCOM G7 SENSOR) MISC Replace q 10 days 3 each 11   donepezil  (ARICEPT ) 5 MG tablet Take 1 tablet (5 mg total) by mouth at bedtime. 90 tablet 1   escitalopram  (LEXAPRO ) 10 MG tablet TAKE 1 TABLET BY MOUTH EVERY DAY 90 tablet 2   levothyroxine  (SYNTHROID ) 112 MCG tablet TAKE 1 TABLET BY MOUTH EVERY DAY 90 tablet 3   Loperamide HCl (ANTI-DIARRHEAL PO) Take 1 tablet by mouth as needed.     metoprolol  succinate (TOPROL -XL) 25 MG 24 hr tablet Take 12.5 mg by mouth at bedtime.     prednisoLONE acetate (PRED FORTE) 1 % ophthalmic suspension PLEASE SEE ATTACHED FOR DETAILED DIRECTIONS     repaglinide  (PRANDIN ) 2 MG tablet Take 1 tablet (2 mg total) by mouth 3 (three) times daily before meals. 90 tablet 11   rosuvastatin  (CRESTOR ) 20 MG tablet TAKE 1 TABLET BY MOUTH EVERY DAY 90 tablet 1   triamcinolone  (KENALOG ) 0.025 % ointment Apply 1 Application topically 2 (two) times daily. 30 g 0   vitamin B-12  (CYANOCOBALAMIN ) 1000 MCG tablet Take 1,000 mcg by mouth daily.     Facility-Administered Medications Prior to Visit  Medication Dose Route Frequency Provider Last Rate Last Admin   denosumab  (PROLIA ) injection 60 mg  60 mg Subcutaneous Once Causey, Lindsey Cornetto, NP        ROS: Review of Systems  Constitutional:  Positive for fatigue. Negative for activity change, appetite change, chills and unexpected weight change.  HENT:  Negative for congestion, mouth sores, sinus pressure and voice change.   Eyes:  Negative for visual disturbance.  Respiratory:  Negative for cough and chest tightness.   Cardiovascular:  Negative for leg swelling.  Gastrointestinal:  Negative for abdominal pain and nausea.  Genitourinary:  Negative for difficulty urinating, frequency and vaginal pain.  Musculoskeletal:  Positive for arthralgias, back pain and gait problem.  Skin:  Negative for pallor and rash.  Neurological:  Negative for dizziness, tremors, weakness, numbness and headaches.  Psychiatric/Behavioral:  Positive for sleep disturbance. Negative for confusion and suicidal ideas. The patient is nervous/anxious.     Objective:  BP 110/68   Pulse (!) 107   Temp 97.9 F (36.6 C) (Temporal)   SpO2 97%   BP Readings from Last 3 Encounters:  11/19/23 110/68  11/11/23 92/78  11/03/23 98/70    Wt Readings from Last 3  Encounters:  11/11/23 145 lb 8 oz (66 kg)  11/03/23 146 lb 9.6 oz (66.5 kg)  10/28/23 146 lb (66.2 kg)    Physical Exam Constitutional:      General: She is not in acute distress.    Appearance: She is well-developed. She is obese.  HENT:     Head: Normocephalic.     Right Ear: External ear normal.     Left Ear: External ear normal.     Nose: Nose normal.  Eyes:     General:        Right eye: No discharge.        Left eye: No discharge.     Conjunctiva/sclera: Conjunctivae normal.     Pupils: Pupils are equal, round, and reactive to light.  Neck:     Thyroid : No  thyromegaly.     Vascular: No JVD.     Trachea: No tracheal deviation.  Cardiovascular:     Rate and Rhythm: Normal rate and regular rhythm.     Heart sounds: Normal heart sounds.  Pulmonary:     Effort: No respiratory distress.     Breath sounds: No stridor. No wheezing.  Abdominal:     General: Bowel sounds are normal. There is no distension.     Palpations: Abdomen is soft. There is no mass.     Tenderness: There is abdominal tenderness. There is no guarding or rebound.  Musculoskeletal:        General: No tenderness.     Cervical back: Normal range of motion and neck supple. No rigidity.  Lymphadenopathy:     Cervical: No cervical adenopathy.  Skin:    Findings: No erythema or rash.  Neurological:     Mental Status: Mental status is at baseline.     Cranial Nerves: No cranial nerve deficit.     Motor: No abnormal muscle tone.     Coordination: Coordination abnormal.     Gait: Gait abnormal.     Deep Tendon Reflexes: Reflexes normal.  Psychiatric:        Behavior: Behavior normal.        Thought Content: Thought content normal.        Judgment: Judgment normal.   In a w/c CBG 183 on Libre 3  Lab Results  Component Value Date   WBC 7.4 10/16/2023   HGB 11.1 (L) 10/16/2023   HCT 38.3 10/16/2023   PLT 364 10/16/2023   GLUCOSE 114 (H) 10/16/2023   CHOL 179 03/20/2023   TRIG 326.0 (H) 03/20/2023   HDL 46.20 03/20/2023   LDLDIRECT 125.0 06/05/2015   LDLCALC 67 03/20/2023   ALT 15 09/29/2023   AST 20 09/29/2023   NA 138 10/16/2023   K 4.1 10/16/2023   CL 104 10/16/2023   CREATININE 1.27 (H) 10/16/2023   BUN 27 (H) 10/16/2023   CO2 19 (L) 10/16/2023   TSH 3.53 03/20/2023   INR 1.1 08/16/2021   HGBA1C 7.8 (H) 09/02/2023    MM 3D SCREENING MAMMOGRAM UNILATERAL LEFT BREAST Result Date: 10/30/2023 CLINICAL DATA:  Screening. EXAM: DIGITAL SCREENING UNILATERAL LEFT MAMMOGRAM WITH CAD AND TOMOSYNTHESIS TECHNIQUE: Left screening digital craniocaudal and mediolateral  oblique mammograms were obtained. Left screening digital breast tomosynthesis was performed. The images were evaluated with computer-aided detection. COMPARISON:  Previous exam(s). ACR Breast Density Category b: There are scattered areas of fibroglandular density. FINDINGS: There are no findings suspicious for malignancy. IMPRESSION: No mammographic evidence of malignancy. A result letter of this screening mammogram will be  mailed directly to the patient. RECOMMENDATION: Screening mammogram in one year. (Code:SM-B-01Y) BI-RADS CATEGORY  1: Negative. Electronically Signed   By: Alm Parkins M.D.   On: 10/30/2023 15:43    Assessment & Plan:   Problem List Items Addressed This Visit     Anxiety disorder   Worse Start Lexapro  10 mg/d Xanax  prn  Potential benefits of a long term benzodiazepines  use as well as potential risks  and complications were explained to the patient and were aknowledged.      Ataxia   Fall prevention was discussed Pt cancelled PT      B12 deficiency   On B12      Coronary artery disease   No angina On Plavix , Crestor , Toprol  F/u w/Dr Jeffrie      Diabetes mellitus type 2, noninsulin dependent (HCC) - Primary   CBG 183 on Libre 3 On Prandin  to 2 mg tid prn Metformin  - stopped due to diarrhea Insulin option was discussed Jardiance  Rx given      Relevant Medications   empagliflozin (JARDIANCE) 10 MG TABS tablet      Meds ordered this encounter  Medications   empagliflozin (JARDIANCE) 10 MG TABS tablet    Sig: Take 1 tablet (10 mg total) by mouth daily.    Dispense:  30 tablet    Refill:  11      Follow-up: Return in about 1 week (around 11/26/2023) for a follow-up visit.  Marolyn Noel, MD

## 2023-11-19 NOTE — Assessment & Plan Note (Addendum)
 Fall prevention was discussed Pt cancelled PT

## 2023-11-19 NOTE — Assessment & Plan Note (Addendum)
 CBG 183 on Libre 3 On Prandin  to 2 mg tid prn Metformin  - stopped due to diarrhea Insulin option was discussed Jardiance  Rx given

## 2023-11-21 ENCOUNTER — Ambulatory Visit: Payer: Self-pay

## 2023-11-21 NOTE — Telephone Encounter (Signed)
 FYI Only or Action Required?: Action required by provider: update on patient condition.  Patient was last seen in primary care on 11/19/2023 by Plotnikov, Karlynn GAILS, MD.  Called Nurse Triage reporting Hypoglycemia.  Symptoms began last two nights.  Interventions attempted: Other: drank juice.  Symptoms are: blood sugar is 151 at this time.  Triage Disposition: Call PCP Now  Patient/caregiver understands and will follow disposition?: Yes      Copied from CRM #[id number]. Topic: Clinical - Red Word Triage >> Nov 21, 2023  4:50 PM Shanda MATSU wrote: Red Word that prompted transfer to Nurse Triage: Patient is calling in reporting that when she goes to bed the sensor measuring her blood sugar drops to mid to low 40's.        Reason for Disposition  [1] Caller has URGENT medication or insulin device (e.g., pump, continuous monitoring) question AND [2] triager unable to answer question  Answer Assessment - Initial Assessment Questions Patient's blood sugar currently 151. Care advice discussed with patient for hypoglycemia and ED precautions dicussed. Patient states if her blood sugar drops again she will go to the ED.     1. SYMPTOMS: What symptoms are you concerned about?     No current symptoms  2. ONSET:  When did the symptoms start?     Happened at night for the last 2 nights  3. BLOOD GLUCOSE: What is your blood glucose level?      Now blood sugar is 151, but has dropped to the 40's for the last 2 nights  4. USUAL RANGE: What is your blood glucose level usually? (e.g., usual fasting morning value, usual evening value)     117-122 5. TYPE 1 or 2:  Do you know what type of diabetes you have?  (e.g., Type 1, Type 2, Gestational; doesn't know)      Type 2 6. INSULIN: Do you take insulin? What type of insulin(s) do you use? What is the mode of delivery? (syringe, pen; injection or pump) When did you last give yourself an insulin dose? (i.e., time or hours/minutes  ago) How much did you give? (i.e., how many units)     No 7. DIABETES PILLS: Do you take any pills for your diabetes? If Yes, ask: What is the name of the medicine(s) that you take for high blood sugar?     Takes Jardiance and Repaglinide .  8. OTHER SYMPTOMS: Do you have any symptoms? (e.g., fever, frequent urination, difficulty breathing, vomiting)     Fatigue but has not been sleeping well  9. LOW BLOOD GLUCOSE TREATMENT: What have you done so far to treat the low blood glucose level?     Has been drinking orange juice as needed  Protocols used: Diabetes - Low Blood Sugar-A-AH

## 2023-11-21 NOTE — Telephone Encounter (Signed)
 Kefir by [employer] in plastic bottles.  Look for it in the milk/yogurt section of any grocery store. Thanks

## 2023-11-22 ENCOUNTER — Other Ambulatory Visit: Payer: Self-pay

## 2023-11-22 ENCOUNTER — Emergency Department (HOSPITAL_COMMUNITY)

## 2023-11-22 ENCOUNTER — Encounter (HOSPITAL_COMMUNITY): Payer: Self-pay

## 2023-11-22 ENCOUNTER — Emergency Department (HOSPITAL_COMMUNITY)
Admission: EM | Admit: 2023-11-22 | Discharge: 2023-11-22 | Disposition: A | Discharge status: Home / Self Care | Attending: Emergency Medicine | Admitting: Emergency Medicine

## 2023-11-22 DIAGNOSIS — E11649 Type 2 diabetes mellitus with hypoglycemia without coma: Secondary | ICD-10-CM | POA: Insufficient documentation

## 2023-11-22 DIAGNOSIS — M1990 Unspecified osteoarthritis, unspecified site: Secondary | ICD-10-CM | POA: Insufficient documentation

## 2023-11-22 DIAGNOSIS — R051 Acute cough: Secondary | ICD-10-CM | POA: Insufficient documentation

## 2023-11-22 DIAGNOSIS — R059 Cough, unspecified: Secondary | ICD-10-CM | POA: Insufficient documentation

## 2023-11-22 DIAGNOSIS — R000 Tachycardia, unspecified: Secondary | ICD-10-CM | POA: Diagnosis not present

## 2023-11-22 DIAGNOSIS — Z7984 Long term (current) use of oral hypoglycemic drugs: Secondary | ICD-10-CM | POA: Diagnosis not present

## 2023-11-22 DIAGNOSIS — I700 Atherosclerosis of aorta: Secondary | ICD-10-CM | POA: Diagnosis not present

## 2023-11-22 DIAGNOSIS — Z7902 Long term (current) use of antithrombotics/antiplatelets: Secondary | ICD-10-CM | POA: Insufficient documentation

## 2023-11-22 DIAGNOSIS — E162 Hypoglycemia, unspecified: Secondary | ICD-10-CM

## 2023-11-22 DIAGNOSIS — R531 Weakness: Secondary | ICD-10-CM | POA: Diagnosis present

## 2023-11-22 LAB — CBC WITH DIFFERENTIAL/PLATELET
Abs Immature Granulocytes: 0.03 K/uL (ref 0.00–0.07)
Basophils Absolute: 0.1 K/uL (ref 0.0–0.1)
Basophils Relative: 1 %
Eosinophils Absolute: 0.3 K/uL (ref 0.0–0.5)
Eosinophils Relative: 3 %
HCT: 43 % (ref 36.0–46.0)
Hemoglobin: 12.8 g/dL (ref 12.0–15.0)
Immature Granulocytes: 0 %
Lymphocytes Relative: 21 %
Lymphs Abs: 2.2 K/uL (ref 0.7–4.0)
MCH: 25.9 pg — ABNORMAL LOW (ref 26.0–34.0)
MCHC: 29.8 g/dL — ABNORMAL LOW (ref 30.0–36.0)
MCV: 87 fL (ref 80.0–100.0)
Monocytes Absolute: 0.9 K/uL (ref 0.1–1.0)
Monocytes Relative: 9 %
Neutro Abs: 6.9 K/uL (ref 1.7–7.7)
Neutrophils Relative %: 66 %
Platelets: 409 K/uL — ABNORMAL HIGH (ref 150–400)
RBC: 4.94 MIL/uL (ref 3.87–5.11)
RDW: 19.9 % — ABNORMAL HIGH (ref 11.5–15.5)
WBC: 10.4 K/uL (ref 4.0–10.5)
nRBC: 0 % (ref 0.0–0.2)

## 2023-11-22 LAB — CBG MONITORING, ED: Glucose-Capillary: 227 mg/dL — ABNORMAL HIGH (ref 70–99)

## 2023-11-22 LAB — RESP PANEL BY RT-PCR (RSV, FLU A&B, COVID)  RVPGX2
Influenza A by PCR: NEGATIVE
Influenza B by PCR: NEGATIVE
Resp Syncytial Virus by PCR: NEGATIVE
SARS Coronavirus 2 by RT PCR: NEGATIVE

## 2023-11-22 LAB — COMPREHENSIVE METABOLIC PANEL WITH GFR
ALT: 15 U/L (ref 0–44)
AST: 28 U/L (ref 15–41)
Albumin: 3.6 g/dL (ref 3.5–5.0)
Alkaline Phosphatase: 57 U/L (ref 38–126)
Anion gap: 15 (ref 5–15)
BUN: 16 mg/dL (ref 8–23)
CO2: 25 mmol/L (ref 22–32)
Calcium: 10.5 mg/dL — ABNORMAL HIGH (ref 8.9–10.3)
Chloride: 98 mmol/L (ref 98–111)
Creatinine, Ser: 1.59 mg/dL — ABNORMAL HIGH (ref 0.44–1.00)
GFR, Estimated: 32 mL/min — ABNORMAL LOW (ref >60)
Glucose, Bld: 231 mg/dL — ABNORMAL HIGH (ref 70–99)
Potassium: 4.9 mmol/L (ref 3.5–5.1)
Sodium: 138 mmol/L (ref 135–145)
Total Bilirubin: 0.6 mg/dL (ref 0.0–1.2)
Total Protein: 7.1 g/dL (ref 6.5–8.1)

## 2023-11-22 LAB — TSH: TSH: 0.41 u[IU]/mL (ref 0.350–4.500)

## 2023-11-22 MED ORDER — DEXTROSE 50 % IV SOLN: 12.5000 g | Freq: Once | INTRAVENOUS | Status: DC

## 2023-11-22 MED ORDER — METFORMIN HCL ER 750 MG PO TB24: 750.0000 mg | ORAL_TABLET | Freq: Every day | ORAL | 0 refills | Status: DC

## 2023-11-22 NOTE — ED Provider Triage Note (Signed)
 Emergency Medicine Provider Triage Evaluation Note  [name] [name] , a 83 y.o. female  was evaluated in triage.  Pt complains of hypoglycemia. Reports that for the last 3 nights she has had blood sugars in the 40s throughout the night. Reports that her monitor wakes her up due to low readings. She is able to get it to come up with juice. Reports she feels generally weak and fatigued throughout the day. Does report she has been coughing more. Denies fevers or chills. Reports some generalized abdominal pain which is not new for her. Denies chest pain or shortness of breath. She has been complaint with her diabetes medications  Review of Systems  Positive:  Negative:   Physical Exam  BP 114/84 (BP Location: Left Wrist)   Pulse (!) 106   Temp 97.7 F (36.5 C)   Resp 16   Ht 5' 4 (1.626 m)   Wt 65.8 kg   SpO2 97%   BMI 24.89 kg/m  Gen:   Awake, no distress   Resp:  Normal effort  MSK:   Moves extremities without difficulty  Other:    Medical Decision Making  Medically screening exam initiated at 1:09 PM.  Appropriate orders placed.  [name] [name] was informed that the remainder of the evaluation will be completed by another provider, this initial triage assessment does not replace that evaluation, and the importance of remaining in the ED until their evaluation is complete.     [name] Lauraine LABOR, PA-C 11/22/23 1312

## 2023-11-22 NOTE — ED Triage Notes (Signed)
 Pt states she has been dealing with low sugars for the past three days. Pt has been drinking sweet beverages and a banana to bring sugar up.  Pt states she has experience dizziness, confusion, and headaches.   Pt states she has generalized pain from osteoarthritis.

## 2023-11-22 NOTE — ED Provider Notes (Signed)
 [place] EMERGENCY DEPARTMENT AT [facility] Provider Note   CSN: [id number] Arrival date & time: 11/22/23  1219     Patient presents with: Hypoglycemia   [name] [name] is a 83 y.o. female.   83 yo F with a chief complaints of nocturnal hypoglycemia.  She said about 3 or 4 in the morning every night she has an alarm go off.  Her doctor wanted her to wear a 24-hour glucose monitor and just placed on Tuesday.  She said every day she has had an alarm go off at that time.  She usually wakes up in a panic and then drink some orange juice or some pomegranate juice and then has improvement.  She called her doctor about this and they told her there is setting up an appointment with an endocrinologist.  She called them back on Friday afternoon and they told her if it happened again she should come to the ED to be evaluated.  She denies any daytime hypoglycemia.  Has been on repaglinide , and was recently started on Jardiance about 2 weeks ago.   She has had a number of medical things happen to her recently.  Had cataract removal diagnosed with macular degeneration had a knee injury.  Has had some abdominal pain off and on after starting metformin  in the past and iron  tablets.   Hypoglycemia      Prior to Admission medications   Medication Sig Start Date End Date Taking? Authorizing Provider  metFORMIN  (GLUCOPHAGE -XR) 750 MG 24 hr tablet Take 1 tablet (750 mg total) by mouth daily with breakfast. 11/22/23  Yes Emil Share, DO  ALPRAZolam  (XANAX ) 1 MG tablet Take 1 tablet (1 mg total) by mouth 3 (three) times daily as needed for anxiety. 08/06/23   Plotnikov, Aleksei V, MD  anastrozole  (ARIMIDEX ) 1 MG tablet TAKE 1 TABLET BY MOUTH EVERY DAY 09/09/23   Causey, Morna Pickle, NP  Blood Glucose Monitoring Suppl DEVI 1 each by Does not apply route in the morning, at noon, and at bedtime. May substitute to any manufacturer covered by patient's insurance. 10/15/23   Plotnikov, Aleksei V,  MD  clobetasol  ointment (TEMOVATE ) 0.05 % Apply 1 Application topically 2 (two) times daily. rash 10/15/23   Plotnikov, Karlynn GAILS, MD  clopidogrel  (PLAVIX ) 75 MG tablet Take 1 tablet (75 mg total) by mouth daily. 09/09/23   Plotnikov, Karlynn GAILS, MD  Continuous Glucose Receiver (DEXCOM G7 RECEIVER) DEVI Use as directed 11/03/23   Plotnikov, Aleksei V, MD  Continuous Glucose Sensor (DEXCOM G7 SENSOR) MISC Replace q 10 days 11/03/23   Plotnikov, Aleksei V, MD  donepezil  (ARICEPT ) 5 MG tablet Take 1 tablet (5 mg total) by mouth at bedtime. 07/21/23   Plotnikov, Aleksei V, MD  empagliflozin (JARDIANCE) 10 MG TABS tablet Take 1 tablet (10 mg total) by mouth daily. 11/19/23   Plotnikov, Karlynn GAILS, MD  escitalopram  (LEXAPRO ) 10 MG tablet TAKE 1 TABLET BY MOUTH EVERY DAY 11/06/23   Plotnikov, Aleksei V, MD  levothyroxine  (SYNTHROID ) 112 MCG tablet TAKE 1 TABLET BY MOUTH EVERY DAY 11/28/22   Plotnikov, Aleksei V, MD  Loperamide HCl (ANTI-DIARRHEAL PO) Take 1 tablet by mouth as needed.    [provider]  metoprolol  succinate (TOPROL -XL) 25 MG 24 hr tablet Take 12.5 mg by mouth at bedtime. 06/03/23   [provider]  prednisoLONE acetate (PRED FORTE) 1 % ophthalmic suspension PLEASE SEE ATTACHED FOR DETAILED DIRECTIONS 04/15/23   [provider]  repaglinide  (PRANDIN ) 2 MG  tablet Take 1 tablet (2 mg total) by mouth 3 (three) times daily before meals. 11/03/23   Plotnikov, Karlynn GAILS, MD  rosuvastatin  (CRESTOR ) 20 MG tablet TAKE 1 TABLET BY MOUTH EVERY DAY 03/27/23   Plotnikov, Aleksei V, MD  triamcinolone  (KENALOG ) 0.025 % ointment Apply 1 Application topically 2 (two) times daily. 10/08/23   Mayer, Jodi R, NP  vitamin B-12 (CYANOCOBALAMIN ) 1000 MCG tablet Take 1,000 mcg by mouth daily.    [provider]    Allergies: Pneumococcal vaccines, Citalopram , Metformin  and related, Sulfa antibiotics, Sulfacetamide sodium-sulfur , Sulfamethoxazole-trimethoprim, Tape, and Tegaderm ag mesh [silver]     Review of Systems  Updated Vital Signs BP 114/84 (BP Location: Left Wrist)   Pulse (!) 106   Temp 97.7 F (36.5 C)   Resp 16   Ht 5' 4 (1.626 m)   Wt 65.8 kg   SpO2 97%   BMI 24.89 kg/m   Physical Exam Vitals and nursing note reviewed.  Constitutional:      General: She is not in acute distress.    Appearance: She is well-developed. She is not diaphoretic.  HENT:     Head: Normocephalic and atraumatic.  Eyes:     Pupils: Pupils are equal, round, and reactive to light.  Cardiovascular:     Rate and Rhythm: Normal rate and regular rhythm.     Heart sounds: No murmur heard.    No friction rub. No gallop.  Pulmonary:     Effort: Pulmonary effort is normal.     Breath sounds: No wheezing or rales.  Abdominal:     General: There is no distension.     Palpations: Abdomen is soft.     Tenderness: There is no abdominal tenderness.     Comments: Benign abdominal exam  Musculoskeletal:        General: No tenderness.     Cervical back: Normal range of motion and neck supple.  Skin:    General: Skin is warm and dry.  Neurological:     Mental Status: She is alert and oriented to person, place, and time.  Psychiatric:        Behavior: Behavior normal.     (all labs ordered are listed, but only abnormal results are displayed) Labs Reviewed  CBC WITH DIFFERENTIAL/PLATELET - Abnormal; Notable for the following components:      Result Value   MCH 25.9 (*)    MCHC 29.8 (*)    RDW 19.9 (*)    Platelets 409 (*)    All other components within normal limits  COMPREHENSIVE METABOLIC PANEL WITH GFR - Abnormal; Notable for the following components:   Glucose, Bld 231 (*)    Creatinine, Ser 1.59 (*)    Calcium  10.5 (*)    GFR, Estimated 32 (*)    All other components within normal limits  CBG MONITORING, ED - Abnormal; Notable for the following components:   Glucose-Capillary 227 (*)    All other components within normal limits  RESP PANEL BY RT-PCR (RSV, FLU A&B, COVID)   [id number]  TSH  URINALYSIS, ROUTINE W REFLEX MICROSCOPIC    EKG: EKG Interpretation Date/Time:  Saturday November 22 2023 13:24:03 EDT Ventricular Rate:  120 PR Interval:  150 QRS Duration:  60 QT Interval:  314 QTC Calculation: 443 R Axis:   106  Text Interpretation: Sinus tachycardia Rightward axis Anterior infarct , age undetermined Abnormal ECG Since last tracing rate faster Otherwise no significant change Confirmed by Emil Share [phone]) on 11/22/2023 3:07:40  PM  Radiology: DG Chest 2 View Result Date: 11/22/2023 CLINICAL DATA:  Cough. EXAM: CHEST - 2 VIEW COMPARISON:  04/30/2023. FINDINGS: The heart size and mediastinal contours are within normal limits. There is atherosclerotic calcification the aorta. No consolidation, effusion, or pneumothorax is seen. Scattered surgical clips are noted bilaterally. There is scoliosis of the thoracic spine with degenerative changes. No acute osseous abnormality. IMPRESSION: No active cardiopulmonary disease. Electronically Signed   By: Leita Birmingham M.D.   On: 11/22/2023 14:12     Procedures   Medications Ordered in the ED - No data to display                                  Medical Decision Making Risk Prescription drug management.   83 yo F with a chief complaints of hypoglycemia.  This occurs about 4 in the morning.  Has occurred for the past 4 nights or so.  She said she has been noticing it ever since she put on a long-term glucose monitor.  She reportedly contacted her doctor about this without any specific instructions with the exception if they continue to come to the ED for evaluation.  I discussed with her that I have no formal training on titrating diabetes medications.  Her lab work here with no hypoglycemia.  She has no other acute concerns.  I discussed with her about stopping her Jardiance and keeping an eye on her blood sugar.  If it persist then perhaps she will to stop her other oral hypoglycemic medication.  I did  prescribe metformin  if she stops both of her medications then have her initiate metformin  therapy.  Call her family doctor on Monday.  3:55 PM:  I have discussed the diagnosis/risks/treatment options with the patient.  Evaluation and diagnostic testing in the emergency department does not suggest an emergent condition requiring admission or immediate intervention beyond what has been performed at this time.  They will follow up with PCP. We also discussed returning to the ED immediately if new or worsening sx occur. We discussed the sx which are most concerning (e.g., sudden worsening pain, fever, inability to tolerate by mouth) that necessitate immediate return. Medications administered to the patient during their visit and any new prescriptions provided to the patient are listed below.  Medications given during this visit Medications - No data to display   The patient appears reasonably screen and/or stabilized for discharge and I doubt any other medical condition or other [facility] requiring further screening, evaluation, or treatment in the ED at this time prior to discharge.       Final diagnoses:  Hypoglycemia    ED Discharge Orders          Ordered    metFORMIN  (GLUCOPHAGE -XR) 750 MG 24 hr tablet  Daily with breakfast        11/22/23 1539               Emil Share, DO 11/22/23 1555

## 2023-11-22 NOTE — ED Notes (Signed)
 Pt attempted to give urine sample. Unable to do so, pt has urine cup and will attempt later

## 2023-11-22 NOTE — ED Triage Notes (Signed)
 Pt recently dx with diabetes.  States meter was alarming low 40's all night, though she drank a lot of orange juice, so it increased to 194

## 2023-11-22 NOTE — Discharge Instructions (Signed)
 I would have you stop your Jardiance.  If you continue to have episodes then I would have you stop your other oral diabetes medication.  At that point I would have you start metformin  which I have sent a prescription in for you.  Please call your family doctor on Monday and let them know that you are having these episodes and see what they recommend.

## 2023-11-24 ENCOUNTER — Telehealth: Payer: Self-pay

## 2023-11-24 DIAGNOSIS — E119 Type 2 diabetes mellitus without complications: Secondary | ICD-10-CM

## 2023-11-24 NOTE — Telephone Encounter (Signed)
 She can call the company helpline.  They will guide her how to apply the sensor step-by-step over the phone.  Global Technical Support: [phone] (available 24/7) Dex[employer] CARE Team: [phone] (Monday-Friday, 6 a.m. to 5 p.m. PST)  Thank you

## 2023-11-24 NOTE — Telephone Encounter (Signed)
 Copied from CRM [id number]. Topic: Referral - Request for Referral >> Nov 24, 2023  8:01 AM Robinson H wrote: Did the patient discuss referral with their provider in the last year? Yes (If No - schedule appointment) (If Yes - send message)  Appointment offered? No  Type of order/referral and detailed reason for visit: Diabetic Specialist  Preference of office, provider, location: No specific place close to home  If referral order, have you been seen by this specialty before? No (If Yes, this issue or another issue? When? Where?  Can we respond through MyChart? No

## 2023-11-25 NOTE — Telephone Encounter (Signed)
 Will do. Thanks.

## 2023-11-26 ENCOUNTER — Ambulatory Visit: Admitting: Internal Medicine

## 2023-11-26 ENCOUNTER — Other Ambulatory Visit: Payer: Self-pay | Admitting: Internal Medicine

## 2023-11-26 ENCOUNTER — Encounter: Payer: Self-pay | Admitting: Internal Medicine

## 2023-11-26 ENCOUNTER — Ambulatory Visit: Payer: Self-pay

## 2023-11-26 ENCOUNTER — Telehealth: Payer: Self-pay

## 2023-11-26 VITALS — BP 110/70 | HR 88 | Temp 97.6°F | Ht 64.0 in

## 2023-11-26 DIAGNOSIS — E213 Hyperparathyroidism, unspecified: Secondary | ICD-10-CM | POA: Diagnosis not present

## 2023-11-26 DIAGNOSIS — I152 Hypertension secondary to endocrine disorders: Secondary | ICD-10-CM | POA: Diagnosis not present

## 2023-11-26 DIAGNOSIS — Z7984 Long term (current) use of oral hypoglycemic drugs: Secondary | ICD-10-CM

## 2023-11-26 DIAGNOSIS — E1159 Type 2 diabetes mellitus with other circulatory complications: Secondary | ICD-10-CM

## 2023-11-26 DIAGNOSIS — E119 Type 2 diabetes mellitus without complications: Secondary | ICD-10-CM

## 2023-11-26 DIAGNOSIS — E162 Hypoglycemia, unspecified: Secondary | ICD-10-CM | POA: Diagnosis not present

## 2023-11-26 MED ORDER — GVOKE HYPOPEN 1-PACK 1 MG/0.2ML ~~LOC~~ SOAJ: 0.2000 mL | Freq: Every day | SUBCUTANEOUS | 3 refills | Status: AC | PRN

## 2023-11-26 NOTE — Telephone Encounter (Addendum)
 FYI Only or Action Required?: Action required by provider: clinical question for provider. Please call patient for guidance on diabetic medications after recent unstable blood sugars.  Patient was last seen in primary care on 11/19/2023 by Plotnikov, Karlynn GAILS, MD.  Called Nurse Triage reporting Blood Sugar Problem.  Symptoms began today.  Interventions attempted: Prescription medications: Metformin , Repaglinidie and Dietary changes. 1 quart of OJ, 1 container of POM juice, 1 packets glucose gel, 1 bowl cheerios with blueberries.  Symptoms are: gradually worsening.  Triage Disposition: Call PCP Now  Patient/caregiver understands and will follow disposition?: Yes  Copied from CRM [id number]. Topic: Clinical - Red Word Triage >> Nov 26, 2023 11:31 AM Vena HERO wrote: Red Word that prompted transfer to Nurse Triage: pt has been having blood sugar fluctuations since this morning starting at 44, husband call emts and sugar got up to 99 so they left. Pt checked sugar again right before she called and it was 265 and at 1133 it is 279 Seen in ER on 10/11 for high sugar at 234  Reason for Disposition  [1] Caller has URGENT medication or insulin device (e.g., pump, continuous monitoring) question AND [2] triager unable to answer question  Answer Assessment - Initial Assessment Questions Pt went to ED on 10/11 for nocturnal hypoglycemia. CBG was 227 during that visit. Pt has been taking repaglinidine since 11/03/23, and jardiance since 11/19/23. Advised at that time to reach out to PCP office to discuss discontinuing some of the oral medications for diabetes pt has been taking. Metformin  was prescribed by ED provider at discharge in case repaglinidine and/or jardiance were to be discontinued by pts PCP, in which case the pt could start metformin  therapy instead. Pt reports she called her PCP on 10/13 and has not heard back yet on diabetic medication guidance. Pt reports taking metformin  and repalinidine  only yesterday, but not jardiance.  This morning pts BG was 44 per CGM and pt was confused, husband called EMS. While waiting for EMS pt drank a quart of OJ and a container of POM juice. EMS arrived and gave pt 2 packets of glucose gel. CBG recheck 99 when EMS left. Pt then ate a bowl of cheerios with blueberries, with Cgm now reading 295. Pt asking if she should take glucerna. Advised to hold of for now and to monitor/ write down BG every hour. Pt already has appt with PCP today at 3:20 pm. Advised to have husband drive pt to appt and that this RN will forward information to office requesting a call to pt with guidance on medication and treatment moving forward.   1. BLOOD GLUCOSE: What is your blood glucose level?      295 per pts CGM at start of call. CGM reading 267 about 10 minutes later.  2. ONSET: When did you check the blood glucose?     Now  3. USUAL RANGE: What is your glucose level usually? (e.g., usual fasting morning value, usual evening value)     90-105  4. KETONES: Do you check for ketones (urine or blood test strips)? If Yes, ask: What does the test show now?      No  5. TYPE 1 or 2:  Do you know what type of diabetes you have?  (e.g., Type 1, Type 2, Gestational; doesn't know)      Type 2  6. INSULIN: Do you take insulin? What type of insulin(s) do you use? What is the mode of delivery? (syringe, pen; injection or  pump)?      No  7. DIABETES PILLS: Do you take any pills for your diabetes? If Yes, ask: Have you missed taking any pills recently?     New rx for 750 mg metformin  on 10/11. Went to ED on 10/13 for hypoglycemia.   8. OTHER SYMPTOMS: Do you have any symptoms? (e.g., fever, frequent urination, difficulty breathing, dizziness, weakness, vomiting)     No. Pt reports feeling fine and much more alert now.  Protocols used: Diabetes - High Blood Sugar-A-AH

## 2023-11-26 NOTE — Telephone Encounter (Signed)
 Copied from CRM [id number]. Topic: Clinical - Prescription Issue >> Nov 26, 2023  5:11 PM Tysheama G wrote: Reason for CRM: Patient stated her insurance doesn't covered Glucagon (GVOKE HYPOPEN 1-PACK) 1 MG/0.2ML, so she wants to know what other medication she can take that's cheaper/covered. Callback number [phone]

## 2023-11-26 NOTE — Patient Instructions (Addendum)
 Use glucose tablets for low sugar Take jardiance 10 mg a day Do not take Metformin  and Repaglinide  for now

## 2023-11-26 NOTE — Assessment & Plan Note (Signed)
 Continue to monitor PTH, calcium  Endocrinology referral

## 2023-11-26 NOTE — Assessment & Plan Note (Addendum)
 New Difficult to control diabetes.  Use glucose tablets for low sugar Take jardiance 10 mg a day Do not take Metformin  and Repaglinide  for now Cont w/Dexcom monitor.  Install application on your phone Diabetes education referral made Endocrinology referral was made

## 2023-11-26 NOTE — Progress Notes (Signed)
 Subjective:  Patient ID: [name] [name], female    DOB: 10/31/1940  Age: 83 y.o. MRN: [id number]  CC: No chief complaint on file.   HPI AMAZIAH GHOSH presents for DM and low CBG of 44 this am Pt had low CBG on 11/22/23 and went to ER  Here w/husband Marcey  Outpatient Medications Prior to Visit  Medication Sig Dispense Refill   ALPRAZolam  (XANAX ) 1 MG tablet Take 1 tablet (1 mg total) by mouth 3 (three) times daily as needed for anxiety. 90 tablet 2   anastrozole  (ARIMIDEX ) 1 MG tablet TAKE 1 TABLET BY MOUTH EVERY DAY 90 tablet 1   Blood Glucose Monitoring Suppl DEVI 1 each by Does not apply route in the morning, at noon, and at bedtime. May substitute to any manufacturer covered by patient's insurance. 1 each 0   clobetasol  ointment (TEMOVATE ) 0.05 % Apply 1 Application topically 2 (two) times daily. rash 120 g 1   clopidogrel  (PLAVIX ) 75 MG tablet Take 1 tablet (75 mg total) by mouth daily. 90 tablet 3   Continuous Glucose Receiver (DEXCOM G7 RECEIVER) DEVI Use as directed 1 each 1   Continuous Glucose Sensor (DEXCOM G7 SENSOR) MISC Replace q 10 days 3 each 11   donepezil  (ARICEPT ) 5 MG tablet Take 1 tablet (5 mg total) by mouth at bedtime. 90 tablet 1   empagliflozin (JARDIANCE) 10 MG TABS tablet Take 1 tablet (10 mg total) by mouth daily. 30 tablet 11   escitalopram  (LEXAPRO ) 10 MG tablet TAKE 1 TABLET BY MOUTH EVERY DAY 90 tablet 2   levothyroxine  (SYNTHROID ) 112 MCG tablet TAKE 1 TABLET BY MOUTH EVERY DAY 90 tablet 3   Loperamide HCl (ANTI-DIARRHEAL PO) Take 1 tablet by mouth as needed.     metFORMIN  (GLUCOPHAGE -XR) 750 MG 24 hr tablet Take 1 tablet (750 mg total) by mouth daily with breakfast. 30 tablet 0   metoprolol  succinate (TOPROL -XL) 25 MG 24 hr tablet Take 12.5 mg by mouth at bedtime.     prednisoLONE acetate (PRED FORTE) 1 % ophthalmic suspension PLEASE SEE ATTACHED FOR DETAILED DIRECTIONS     repaglinide  (PRANDIN ) 2 MG tablet Take 1 tablet (2 mg total) by mouth 3  (three) times daily before meals. 90 tablet 11   rosuvastatin  (CRESTOR ) 20 MG tablet TAKE 1 TABLET BY MOUTH EVERY DAY 90 tablet 1   triamcinolone  (KENALOG ) 0.025 % ointment Apply 1 Application topically 2 (two) times daily. 30 g 0   vitamin B-12 (CYANOCOBALAMIN ) 1000 MCG tablet Take 1,000 mcg by mouth daily.     Facility-Administered Medications Prior to Visit  Medication Dose Route Frequency Provider Last Rate Last Admin   denosumab  (PROLIA ) injection 60 mg  60 mg Subcutaneous Once Causey, Lindsey Cornetto, NP        ROS: Review of Systems  Constitutional:  Positive for fatigue. Negative for activity change, appetite change, chills and unexpected weight change.  HENT:  Negative for congestion, mouth sores and sinus pressure.   Eyes:  Negative for visual disturbance.  Respiratory:  Negative for cough and chest tightness.   Gastrointestinal:  Negative for abdominal pain and nausea.  Genitourinary:  Negative for difficulty urinating, frequency and vaginal pain.  Musculoskeletal:  Positive for back pain and gait problem.  Skin:  Negative for pallor and rash.  Neurological:  Positive for weakness. Negative for dizziness, tremors, numbness and headaches.  Psychiatric/Behavioral:  Negative for confusion, sleep disturbance and suicidal ideas. The patient is nervous/anxious.     Objective:  BP 110/70   Pulse 88   Temp 97.6 F (36.4 C) (Temporal)   Ht 5' 4 (1.626 m)   SpO2 96%   BMI 24.89 kg/m   BP Readings from Last 3 Encounters:  11/26/23 110/70  11/22/23 114/84  11/19/23 110/68    Wt Readings from Last 3 Encounters:  11/22/23 145 lb (65.8 kg)  11/11/23 145 lb 8 oz (66 kg)  11/03/23 146 lb 9.6 oz (66.5 kg)    Physical Exam Constitutional:      General: She is not in acute distress.    Appearance: She is well-developed. She is obese.  HENT:     Head: Normocephalic.     Right Ear: External ear normal.     Left Ear: External ear normal.     Nose: Nose normal.  Eyes:      General:        Right eye: No discharge.        Left eye: No discharge.     Conjunctiva/sclera: Conjunctivae normal.     Pupils: Pupils are equal, round, and reactive to light.  Neck:     Thyroid : No thyromegaly.     Vascular: No JVD.     Trachea: No tracheal deviation.  Cardiovascular:     Rate and Rhythm: Normal rate and regular rhythm.     Heart sounds: Normal heart sounds.  Pulmonary:     Effort: No respiratory distress.     Breath sounds: No stridor. No wheezing.  Abdominal:     General: Bowel sounds are normal. There is no distension.     Palpations: Abdomen is soft. There is no mass.     Tenderness: There is no abdominal tenderness. There is no guarding or rebound.  Musculoskeletal:        General: No tenderness.     Cervical back: Normal range of motion and neck supple. No rigidity.  Lymphadenopathy:     Cervical: No cervical adenopathy.  Skin:    Findings: No erythema or rash.  Neurological:     Mental Status: She is oriented to person, place, and time.     Cranial Nerves: No cranial nerve deficit.     Motor: No abnormal muscle tone.     Coordination: Coordination normal.     Deep Tendon Reflexes: Reflexes normal.  Psychiatric:        Behavior: Behavior normal.        Thought Content: Thought content normal.        Judgment: Judgment normal.     Lab Results  Component Value Date   WBC 10.4 11/22/2023   HGB 12.8 11/22/2023   HCT 43.0 11/22/2023   PLT 409 (H) 11/22/2023   GLUCOSE 231 (H) 11/22/2023   CHOL 179 03/20/2023   TRIG 326.0 (H) 03/20/2023   HDL 46.20 03/20/2023   LDLDIRECT 125.0 06/05/2015   LDLCALC 67 03/20/2023   ALT 15 11/22/2023   AST 28 11/22/2023   NA 138 11/22/2023   K 4.9 11/22/2023   CL 98 11/22/2023   CREATININE 1.59 (H) 11/22/2023   BUN 16 11/22/2023   CO2 25 11/22/2023   TSH 0.410 11/22/2023   INR 1.1 08/16/2021   HGBA1C 7.8 (H) 09/02/2023    DG Chest 2 View Result Date: 11/22/2023 CLINICAL DATA:  Cough. EXAM: CHEST - 2  VIEW COMPARISON:  04/30/2023. FINDINGS: The heart size and mediastinal contours are within normal limits. There is atherosclerotic calcification the aorta. No consolidation, effusion, or pneumothorax is seen. Scattered surgical  clips are noted bilaterally. There is scoliosis of the thoracic spine with degenerative changes. No acute osseous abnormality. IMPRESSION: No active cardiopulmonary disease. Electronically Signed   By: Leita Birmingham M.D.   On: 11/22/2023 14:12    Assessment & Plan:   Problem List Items Addressed This Visit     Diabetes mellitus type 2, noninsulin dependent (HCC) - Primary   Difficult to control diabetes.  Use glucose tablets for low sugar Take jardiance 10 mg a day Do not take Metformin  and Repaglinide  for now Cont w/Dexcom monitor.  Install application on your phone Diabetes education referral made Endocrinology referral was made      Relevant Medications   Glucagon (GVOKE HYPOPEN 1-PACK) 1 MG/0.2ML SOAJ   Other Relevant Orders   Ambulatory referral to Endocrinology   Amb Referral to Nutrition and Diabetic Education   Hyperparathyroidism   Continue to monitor PTH, calcium  Endocrinology referral      Hypertension associated with diabetes (HCC)   On Telmisartan  at lower dose      Relevant Medications   Glucagon (GVOKE HYPOPEN 1-PACK) 1 MG/0.2ML SOAJ   Hypoglycemia   New Difficult to control diabetes.  Use glucose tablets for low sugar Take jardiance 10 mg a day Do not take Metformin  and Repaglinide  for now Cont w/Dexcom monitor.  Install application on your phone Diabetes education referral made Endocrinology referral was made      Relevant Orders   Ambulatory referral to Endocrinology   Amb Referral to Nutrition and Diabetic Education      Meds ordered this encounter  Medications   Glucagon (GVOKE HYPOPEN 1-PACK) 1 MG/0.2ML SOAJ    Sig: Inject 0.2 mLs into the skin daily as needed.    Dispense:  0.4 mL    Refill:  3      Follow-up:  Return in about 2 weeks (around 12/10/2023) for a follow-up visit.  Marolyn Noel, MD

## 2023-11-26 NOTE — Assessment & Plan Note (Addendum)
 Difficult to control diabetes.  Use glucose tablets for low sugar Take jardiance 10 mg a day Do not take Metformin  and Repaglinide  for now Cont w/Dexcom monitor.  Install application on your phone Diabetes education referral made Endocrinology referral was made

## 2023-11-26 NOTE — Assessment & Plan Note (Signed)
On Telmisartan at lower dose

## 2023-11-27 ENCOUNTER — Encounter (HOSPITAL_COMMUNITY): Payer: Self-pay | Admitting: Pharmacy Technician

## 2023-11-27 ENCOUNTER — Other Ambulatory Visit: Payer: Self-pay

## 2023-11-27 ENCOUNTER — Observation Stay (HOSPITAL_COMMUNITY)

## 2023-11-27 ENCOUNTER — Emergency Department (HOSPITAL_COMMUNITY)

## 2023-11-27 ENCOUNTER — Observation Stay (HOSPITAL_COMMUNITY)
Admission: EM | Admit: 2023-11-27 | Discharge: 2023-11-28 | Disposition: A | Discharge status: Home / Self Care | Attending: Internal Medicine | Admitting: Internal Medicine

## 2023-11-27 DIAGNOSIS — R471 Dysarthria and anarthria: Secondary | ICD-10-CM | POA: Diagnosis not present

## 2023-11-27 DIAGNOSIS — E1122 Type 2 diabetes mellitus with diabetic chronic kidney disease: Secondary | ICD-10-CM | POA: Diagnosis not present

## 2023-11-27 DIAGNOSIS — I129 Hypertensive chronic kidney disease with stage 1 through stage 4 chronic kidney disease, or unspecified chronic kidney disease: Secondary | ICD-10-CM | POA: Diagnosis not present

## 2023-11-27 DIAGNOSIS — R29818 Other symptoms and signs involving the nervous system: Secondary | ICD-10-CM | POA: Diagnosis not present

## 2023-11-27 DIAGNOSIS — R0981 Nasal congestion: Secondary | ICD-10-CM | POA: Diagnosis present

## 2023-11-27 DIAGNOSIS — R55 Syncope and collapse: Secondary | ICD-10-CM | POA: Diagnosis not present

## 2023-11-27 DIAGNOSIS — R4182 Altered mental status, unspecified: Secondary | ICD-10-CM | POA: Diagnosis not present

## 2023-11-27 DIAGNOSIS — R2990 Unspecified symptoms and signs involving the nervous system: Secondary | ICD-10-CM | POA: Diagnosis not present

## 2023-11-27 DIAGNOSIS — R519 Headache, unspecified: Secondary | ICD-10-CM | POA: Insufficient documentation

## 2023-11-27 DIAGNOSIS — R29810 Facial weakness: Secondary | ICD-10-CM | POA: Insufficient documentation

## 2023-11-27 DIAGNOSIS — Z853 Personal history of malignant neoplasm of breast: Secondary | ICD-10-CM | POA: Diagnosis not present

## 2023-11-27 DIAGNOSIS — Z79899 Other long term (current) drug therapy: Secondary | ICD-10-CM | POA: Insufficient documentation

## 2023-11-27 DIAGNOSIS — D649 Anemia, unspecified: Secondary | ICD-10-CM | POA: Diagnosis present

## 2023-11-27 DIAGNOSIS — E1159 Type 2 diabetes mellitus with other circulatory complications: Secondary | ICD-10-CM | POA: Diagnosis not present

## 2023-11-27 DIAGNOSIS — R410 Disorientation, unspecified: Secondary | ICD-10-CM | POA: Diagnosis not present

## 2023-11-27 DIAGNOSIS — R059 Cough, unspecified: Secondary | ICD-10-CM | POA: Diagnosis not present

## 2023-11-27 DIAGNOSIS — I152 Hypertension secondary to endocrine disorders: Secondary | ICD-10-CM

## 2023-11-27 DIAGNOSIS — Z7902 Long term (current) use of antithrombotics/antiplatelets: Secondary | ICD-10-CM | POA: Insufficient documentation

## 2023-11-27 DIAGNOSIS — E1169 Type 2 diabetes mellitus with other specified complication: Secondary | ICD-10-CM | POA: Diagnosis present

## 2023-11-27 DIAGNOSIS — R531 Weakness: Secondary | ICD-10-CM | POA: Diagnosis not present

## 2023-11-27 DIAGNOSIS — I6782 Cerebral ischemia: Secondary | ICD-10-CM | POA: Diagnosis not present

## 2023-11-27 DIAGNOSIS — F419 Anxiety disorder, unspecified: Secondary | ICD-10-CM | POA: Diagnosis not present

## 2023-11-27 DIAGNOSIS — N1832 Chronic kidney disease, stage 3b: Secondary | ICD-10-CM | POA: Diagnosis present

## 2023-11-27 DIAGNOSIS — E213 Hyperparathyroidism, unspecified: Secondary | ICD-10-CM | POA: Diagnosis not present

## 2023-11-27 DIAGNOSIS — E11649 Type 2 diabetes mellitus with hypoglycemia without coma: Principal | ICD-10-CM | POA: Diagnosis present

## 2023-11-27 DIAGNOSIS — E162 Hypoglycemia, unspecified: Secondary | ICD-10-CM | POA: Diagnosis present

## 2023-11-27 DIAGNOSIS — M6281 Muscle weakness (generalized): Secondary | ICD-10-CM | POA: Diagnosis not present

## 2023-11-27 DIAGNOSIS — E785 Hyperlipidemia, unspecified: Secondary | ICD-10-CM | POA: Diagnosis present

## 2023-11-27 DIAGNOSIS — R051 Acute cough: Secondary | ICD-10-CM

## 2023-11-27 DIAGNOSIS — I10 Essential (primary) hypertension: Secondary | ICD-10-CM | POA: Diagnosis not present

## 2023-11-27 DIAGNOSIS — Z7984 Long term (current) use of oral hypoglycemic drugs: Secondary | ICD-10-CM | POA: Insufficient documentation

## 2023-11-27 DIAGNOSIS — R9089 Other abnormal findings on diagnostic imaging of central nervous system: Secondary | ICD-10-CM | POA: Diagnosis not present

## 2023-11-27 DIAGNOSIS — E039 Hypothyroidism, unspecified: Secondary | ICD-10-CM | POA: Diagnosis not present

## 2023-11-27 DIAGNOSIS — E118 Type 2 diabetes mellitus with unspecified complications: Secondary | ICD-10-CM | POA: Diagnosis present

## 2023-11-27 DIAGNOSIS — I639 Cerebral infarction, unspecified: Secondary | ICD-10-CM | POA: Diagnosis not present

## 2023-11-27 DIAGNOSIS — R000 Tachycardia, unspecified: Secondary | ICD-10-CM | POA: Diagnosis not present

## 2023-11-27 LAB — ECHOCARDIOGRAM COMPLETE
Area-P 1/2: 3.26 cm2
S' Lateral: 1.8 cm

## 2023-11-27 LAB — BASIC METABOLIC PANEL WITH GFR
Anion gap: 10 (ref 5–15)
BUN: 20 mg/dL (ref 8–23)
CO2: 25 mmol/L (ref 22–32)
Calcium: 9.3 mg/dL (ref 8.9–10.3)
Chloride: 101 mmol/L (ref 98–111)
Creatinine, Ser: 1.43 mg/dL — ABNORMAL HIGH (ref 0.44–1.00)
GFR, Estimated: 36 mL/min — ABNORMAL LOW (ref >60)
Glucose, Bld: 188 mg/dL — ABNORMAL HIGH (ref 70–99)
Potassium: 4.4 mmol/L (ref 3.5–5.1)
Sodium: 136 mmol/L (ref 135–145)

## 2023-11-27 LAB — RESPIRATORY PANEL BY PCR

## 2023-11-27 LAB — CBC
HCT: 36 % (ref 36.0–46.0)
Hemoglobin: 10.9 g/dL — ABNORMAL LOW (ref 12.0–15.0)
MCH: 26.3 pg (ref 26.0–34.0)
MCHC: 30.3 g/dL (ref 30.0–36.0)
MCV: 87 fL (ref 80.0–100.0)
Platelets: 333 K/uL (ref 150–400)
RBC: 4.14 MIL/uL (ref 3.87–5.11)
RDW: 18.7 % — ABNORMAL HIGH (ref 11.5–15.5)
WBC: 6.9 K/uL (ref 4.0–10.5)
nRBC: 0 % (ref 0.0–0.2)

## 2023-11-27 LAB — HEMOGLOBIN A1C
Hgb A1c MFr Bld: 5.4 % (ref 4.8–5.6)
Mean Plasma Glucose: 108.28 mg/dL

## 2023-11-27 LAB — CBG MONITORING, ED
Glucose-Capillary: 169 mg/dL — ABNORMAL HIGH (ref 70–99)
Glucose-Capillary: 143 mg/dL — ABNORMAL HIGH (ref 70–99)

## 2023-11-27 LAB — C-REACTIVE PROTEIN: CRP: 7.9 mg/dL — ABNORMAL HIGH (ref <1.0)

## 2023-11-27 LAB — GLUCOSE, CAPILLARY: Glucose-Capillary: 155 mg/dL — ABNORMAL HIGH (ref 70–99)

## 2023-11-27 MED ORDER — GUAIFENESIN ER 600 MG PO TB12
600.0000 mg | ORAL_TABLET | Freq: Two times a day (BID) | ORAL | Status: DC
  Administered 2023-11-27 – 2023-11-28 (×2): 600 mg via ORAL
  Filled 2023-11-27 (×2): qty 1

## 2023-11-27 MED ORDER — CAMPHOR-MENTHOL-METHYL SAL 3.1-6-10 % EX PTCH: 1.0000 | MEDICATED_PATCH | Freq: Every day | CUTANEOUS | Status: DC | PRN

## 2023-11-27 MED ORDER — ENOXAPARIN SODIUM 30 MG/0.3ML IJ SOSY
30.0000 mg | PREFILLED_SYRINGE | INTRAMUSCULAR | Status: DC
  Administered 2023-11-27: 30 mg via SUBCUTANEOUS
  Filled 2023-11-27: qty 0.3

## 2023-11-27 MED ORDER — SODIUM CHLORIDE 0.9 % IV SOLN: INTRAVENOUS | Status: AC

## 2023-11-27 MED ORDER — STROKE: EARLY STAGES OF RECOVERY BOOK
Freq: Once | Status: AC
  Filled 2023-11-27: qty 1

## 2023-11-27 MED ORDER — ALPRAZOLAM 0.5 MG PO TABS
1.0000 mg | ORAL_TABLET | Freq: Three times a day (TID) | ORAL | Status: DC | PRN
  Administered 2023-11-27: 1 mg via ORAL
  Filled 2023-11-27: qty 2

## 2023-11-27 MED ORDER — PREDNISOLONE ACETATE 1 % OP SUSP: 1.0000 [drp] | Freq: Every evening | OPHTHALMIC | Status: DC | PRN

## 2023-11-27 MED ORDER — ACETAMINOPHEN 650 MG RE SUPP: 650.0000 mg | RECTAL | Status: DC | PRN

## 2023-11-27 MED ORDER — INSULIN ASPART 100 UNIT/ML IJ SOLN: 0.0000 [IU] | Freq: Three times a day (TID) | INTRAMUSCULAR | Status: DC

## 2023-11-27 MED ORDER — POLYVINYL ALCOHOL 1.4 % OP SOLN: 1.0000 [drp] | OPHTHALMIC | Status: DC | PRN

## 2023-11-27 MED ORDER — LEVOTHYROXINE SODIUM 112 MCG PO TABS
112.0000 ug | ORAL_TABLET | Freq: Every day | ORAL | Status: DC
  Administered 2023-11-28: 112 ug via ORAL
  Filled 2023-11-27: qty 1

## 2023-11-27 MED ORDER — ACETAMINOPHEN 325 MG PO TABS: 650.0000 mg | ORAL_TABLET | ORAL | Status: DC | PRN

## 2023-11-27 MED ORDER — METOPROLOL SUCCINATE ER 25 MG PO TB24: 12.5000 mg | ORAL_TABLET | Freq: Every day | ORAL | Status: DC

## 2023-11-27 MED ORDER — FLUTICASONE PROPIONATE 50 MCG/ACT NA SUSP
2.0000 | Freq: Every day | NASAL | Status: DC
  Administered 2023-11-28: 2 via NASAL
  Filled 2023-11-27: qty 16

## 2023-11-27 MED ORDER — SENNOSIDES-DOCUSATE SODIUM 8.6-50 MG PO TABS: 1.0000 | ORAL_TABLET | Freq: Every evening | ORAL | Status: DC | PRN

## 2023-11-27 MED ORDER — SODIUM CHLORIDE 0.9 % IV SOLN
2.0000 g | INTRAVENOUS | Status: DC
  Administered 2023-11-27: 2 g via INTRAVENOUS
  Filled 2023-11-27: qty 20

## 2023-11-27 MED ORDER — EMPAGLIFLOZIN 10 MG PO TABS
10.0000 mg | ORAL_TABLET | Freq: Every day | ORAL | Status: AC
Start: 2023-11-27 — End: ?
  Administered 2023-11-28: 10 mg via ORAL
  Filled 2023-11-27: qty 1

## 2023-11-27 MED ORDER — ROSUVASTATIN CALCIUM 20 MG PO TABS
20.0000 mg | ORAL_TABLET | Freq: Every day | ORAL | Status: DC
  Administered 2023-11-28: 20 mg via ORAL
  Filled 2023-11-27: qty 1

## 2023-11-27 MED ORDER — ERYTHROMYCIN 5 MG/GM OP OINT: 1.0000 | TOPICAL_OINTMENT | Freq: Every day | OPHTHALMIC | Status: DC

## 2023-11-27 MED ORDER — ANASTROZOLE 1 MG PO TABS
1.0000 mg | ORAL_TABLET | Freq: Every day | ORAL | Status: DC
  Administered 2023-11-28: 1 mg via ORAL
  Filled 2023-11-27 (×3): qty 1

## 2023-11-27 MED ORDER — CARBOXYMETHYLCELLULOSE SODIUM 1 % OP SOLN: 1.0000 [drp] | Freq: Every day | OPHTHALMIC | Status: DC | PRN

## 2023-11-27 MED ORDER — ACETAMINOPHEN 160 MG/5ML PO SOLN: 650.0000 mg | ORAL | Status: DC | PRN

## 2023-11-27 MED ORDER — DONEPEZIL HCL 10 MG PO TABS
5.0000 mg | ORAL_TABLET | Freq: Every day | ORAL | Status: DC
  Administered 2023-11-27: 5 mg via ORAL
  Filled 2023-11-27: qty 1

## 2023-11-27 NOTE — ED Notes (Signed)
 PT to MRI

## 2023-11-27 NOTE — ED Notes (Signed)
 Floor notified pt is on her way up.

## 2023-11-27 NOTE — ED Notes (Signed)
 Extra DG, Blue & SST top drawn

## 2023-11-27 NOTE — ED Provider Notes (Signed)
 [place] EMERGENCY DEPARTMENT AT [facility] Provider Note   CSN: [id number] Arrival date & time: 11/27/23  9085     Patient presents with: Hypoglycemia   [name] [name] is a 83 y.o. female.    Hypoglycemia Patient brought in for decreased mental status.  Reportedly had generalized weakness.  Difficulty speaking.  States just generally weak.  Reportedly had sugar checked via max of 74.  History of diabetes and has had issues with her sugar going low.  Recent adjustment of her medications.  No headache.  States she was not laterally weak although reported had right sided facial droop and right sided weakness by EMS report.  No headache.  States she is back to normal after getting the glucose by EMS.    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 Authorizing Provider  acetaminophen  (TYLENOL ) 650 MG CR tablet Take 650 mg by mouth every 8 (eight) hours as needed for pain.   Yes [provider]  ALPRAZolam  (XANAX ) 1 MG tablet Take 1 tablet (1 mg total) by mouth 3 (three) times daily as needed for anxiety. 08/06/23  Yes Plotnikov, Karlynn GAILS, MD  anastrozole  (ARIMIDEX ) 1 MG tablet TAKE 1 TABLET BY MOUTH EVERY DAY 09/09/23  Yes Causey, Morna Pickle, NP  carboxymethylcellulose 1 %  ophthalmic solution Place 1 drop into both eyes daily as needed (dry eyes).   Yes [provider]  clobetasol  ointment (TEMOVATE ) 0.05 % Apply 1 Application topically 2 (two) times daily. rash 10/15/23  Yes Plotnikov, Aleksei V, MD  empagliflozin (JARDIANCE) 10 MG TABS tablet Take 1 tablet (10 mg total) by mouth daily. 11/19/23  Yes Plotnikov, Aleksei V, MD  metoprolol  succinate (TOPROL -XL) 25 MG 24 hr tablet Take 12.5 mg by mouth at bedtime. 06/03/23  Yes [provider]  prednisoLONE acetate (PRED FORTE) 1 % ophthalmic suspension Place 1 drop into both eyes at bedtime as needed (For clear vision). 04/15/23  Yes [provider]  repaglinide  (PRANDIN ) 2 MG tablet Take 1 tablet (2 mg total) by mouth 3 (three) times daily before meals. 11/03/23  Yes Plotnikov, Aleksei V, MD  vitamin B-12 (CYANOCOBALAMIN ) 1000 MCG tablet Take 1,000 mcg by mouth daily.   Yes [provider]  Blood Glucose Monitoring Suppl DEVI 1 each by Does not apply route in the morning, at noon, and at bedtime. May substitute to any manufacturer covered by patient's insurance. 10/15/23   Plotnikov, Aleksei V, MD  clopidogrel  (PLAVIX ) 75 MG tablet Take 1 tablet (75 mg total) by mouth daily. Patient not taking: Reported on 11/27/2023 09/09/23   Plotnikov, Karlynn GAILS, MD  Continuous Glucose Receiver (DEXCOM G7 RECEIVER) DEVI Use as  directed 11/03/23   Plotnikov, Aleksei V, MD  Continuous Glucose Sensor (DEXCOM G7 SENSOR) MISC Replace q 10 days 11/03/23   Plotnikov, Aleksei V, MD  donepezil  (ARICEPT ) 5 MG tablet Take 1 tablet (5 mg total) by mouth at bedtime. 07/21/23   Plotnikov, Karlynn GAILS, MD  escitalopram  (LEXAPRO ) 10 MG tablet TAKE 1 TABLET BY MOUTH EVERY DAY Patient not taking: Reported on 11/27/2023 11/06/23   Plotnikov, Aleksei V, MD  Glucagon (GVOKE HYPOPEN 1-PACK) 1 MG/0.2ML SOAJ Inject 0.2 mLs into the skin daily as needed. 11/26/23   Plotnikov, Aleksei V, MD  levothyroxine  (SYNTHROID ) 112 MCG tablet TAKE 1  TABLET BY MOUTH EVERY DAY 11/28/22   Plotnikov, Aleksei V, MD  Loperamide HCl (ANTI-DIARRHEAL PO) Take 1 tablet by mouth as needed. Patient not taking: Reported on 11/27/2023    [provider]  metFORMIN  (GLUCOPHAGE -XR) 750 MG 24 hr tablet Take 1 tablet (750 mg total) by mouth daily with breakfast. Patient not taking: Reported on 11/27/2023 11/22/23   Emil Share, DO  rosuvastatin  (CRESTOR ) 20 MG tablet TAKE 1 TABLET BY MOUTH EVERY DAY 03/27/23   Plotnikov, Aleksei V, MD    Allergies: Pneumococcal vaccines, Citalopram , Metformin  and related, Sulfa antibiotics, Tape, and Tegaderm ag mesh [silver]    Review of Systems  Updated Vital Signs BP 116/77   Pulse 90   Temp 97.7 F (36.5 C) (Oral)   Resp (!) 25   SpO2 97%   Physical Exam Vitals and nursing note reviewed.  HENT:     Head: Atraumatic.  Cardiovascular:     Rate and Rhythm: Normal rate.  Abdominal:     Tenderness: There is no abdominal tenderness.  Neurological:     Mental Status: She is alert and oriented to person, place, and time. Mental status is at baseline.     Comments: Face symmetric.  Normal speech.  Able to inflate cheeks.  Good grip strength bilaterally.  Good straight leg raise bilaterally.  Sensation intact bilaterally.     (all labs ordered are listed, but only abnormal results are displayed) Labs Reviewed  BASIC METABOLIC PANEL WITH GFR - Abnormal; Notable for the following components:      Result Value   Glucose, Bld 188 (*)    Creatinine, Ser 1.43 (*)    GFR, Estimated 36 (*)    All other components within normal limits  CBC - Abnormal; Notable for the following components:   Hemoglobin 10.9 (*)    RDW 18.7 (*)    All other components within normal limits  CBG MONITORING, ED - Abnormal; Notable for the following components:   Glucose-Capillary 169 (*)    All other components within normal limits  CBG MONITORING, ED - Abnormal; Notable for the following components:   Glucose-Capillary 143  (*)    All other components within normal limits    EKG: None  Radiology: CT HEAD WO CONTRAST ( ) Result Date: 11/27/2023 EXAM: CT HEAD WITHOUT CONTRAST 11/27/2023 10:13:06 AM TECHNIQUE: CT of the head was performed without the administration of intravenous contrast. Automated exposure control, iterative reconstruction, and/or weight based adjustment of the mA/kV was utilized to reduce the radiation dose to as low as reasonably achievable. COMPARISON: CT head 10/16/2023 CLINICAL HISTORY: Neuro deficit, acute, stroke suspected. Pt woke up this morning feeling lethargic, R sided facial droop and R sided weakness. FINDINGS: BRAIN AND VENTRICLES: No acute hemorrhage. No evidence of acute infarct. No hydrocephalus. No extra-axial collection. No mass effect or midline shift. Moderate white matter hypodensities,  compatible with chronic microvascular ischemic change. ORBITS: No acute abnormality. SINUSES: No acute abnormality. SOFT TISSUES AND SKULL: No acute soft tissue abnormality. No skull fracture. IMPRESSION: 1. No acute intracranial abnormality. 2. Moderate chronic microvascular ischemic change. Electronically signed by: Gilmore Molt MD 11/27/2023 10:25 AM EDT RP Workstation: [id number]     Procedures   Medications Ordered in the ED - No data to display                                  Medical Decision Making Amount and/or Complexity of Data Reviewed Labs: ordered. Radiology: ordered.  Risk Decision regarding hospitalization.   Patient reported generalized weakness.  Some question of lateralizing symptoms although none now.  Patient states she did not have lateralizing symptoms.  States she did have some difficulty getting words out just overall due to the weakness.  Reviewed recent PCP note.  Reviewed previous MRI.  Sugar now more elevated without symptoms.  Will continue to monitor.  Will get head CT and basic blood work.  Blood work overall reassuring.  Sugars remained  elevated here.  However states she had another episode where she felt lightheaded.  Reviewed EMS report and showed that there was some focal right sided weakness.  Patient's husband is now here and says that she may have been weak on the right side. Not a TNK candidate due to resolution of symptoms.  States that the fingerstick does not match up with her other glucose monitor at times.  States it will sometimes show in the 40s.  However I cannot see evidence of last night sugars on it.  However with potential focal deficits I think patient benefit from admission to the hospital.  Will discuss with hospitalist.       Final diagnoses:  Hypoglycemia  Focal neurological deficit    ED Discharge Orders     None          Patsey Lot, MD 11/27/23 1144

## 2023-11-27 NOTE — Progress Notes (Signed)
  Echocardiogram 2D Echocardiogram has been performed.  Tinnie FORBES Gosling RDCS 11/27/2023, 3:01 PM

## 2023-11-27 NOTE — ED Notes (Signed)
 Pt called out and reported she feels like she is sinking or getting low.  Pt is unable to elaborate.  Primary RN made aware.

## 2023-11-27 NOTE — ED Triage Notes (Signed)
 Pt bib ems from home. Pt woke up this morning feeling lethargic, R sided facial droop and R sided weakness. Hx CVA. CBG 74. Had 2 episodes of same yesterday. Currently on d10 drip, approx 18G  Pt states this happens every time her glucose falls below 80. On arrival pt with no deficits. GCS 15. 140/86 HR 100 RR 18 98% RA CBG 154

## 2023-11-27 NOTE — ED Notes (Signed)
 Floor notified coming up from MRI

## 2023-11-27 NOTE — Inpatient Diabetes Management (Signed)
 Inpatient Diabetes Program Recommendations  AACE/ADA: New Consensus Statement on Inpatient Glycemic Control (2015)  Target Ranges:  Prepandial:   less than 140 mg/dL      Peak postprandial:   less than 180 mg/dL (1-2 hours)      Critically ill patients:  140 - 180 mg/dL   Lab Results  Component Value Date   GLUCAP 143 (H) 11/27/2023   HGBA1C 5.4 11/27/2023    Review of Glycemic Control  Latest Reference Range & Units 11/27/23 09:20 11/27/23 11:06  Glucose-Capillary 70 - 99 mg/dL 830 (H) 856 (H)   Diabetes history: DM 2 Outpatient Diabetes medications:  Dexcom G7 Jardiance 10 mg daily- started on 11/19/23 Prandin  2 mg tid- stopped on 11/26/23 Metformin  -d/c'd on 11/19/23 Current orders for Inpatient glycemic control:  None  Inpatient Diabetes Program Recommendations:    Spoke to patient at length at bedside in ED.  Sounds like she was started on Dexcom G7 CGM on 11/19/23.  She had a reader but had lost the charger so MD gave her a second reader.  Patient had the second reader at the bedside however it had not been set-up/paired with the CGM.  Dexcom G7 was present on Left arm.  Patient states that she has been waking with low alarms and blood sugars in the 40's.  However when she did a fingerstick it was 77 or 80 mg/dL.  She did not endorse symptoms of low blood sugars?  I explained that when she lays on the sensor, sometimes blood flow is cut off, and therefore a compression low may occur.  This is a false reading.  Therefore anytime the sensor reads low, she needs to verify with a CBG check (fingerstick).  Patient states she is much more comfortable with fingersticks and never had any issues prior to the CGM.  Recommend that CGM be removed for now and that CBG's just be monitored in the hospital. A1C is 5.4$ indicating average blood sugar of 108 mg/dL.  Prandin  was discontinued on 10/15- I agree with this.  Patient does not seem to need this.  I helped patient remove dexcom sensor  (reader was not connected anyway).  Notified Dr. Claudene.    Thanks,  Randall Bullocks, RN, BC-ADM Inpatient Diabetes Coordinator Pager [phone]  (8a-5p)

## 2023-11-27 NOTE — H&P (Signed)
 History and Physical    Patient: [name] [name] FMW:[id number] DOB: 19-Oct-1940 DOA: 11/27/2023 DOS: the patient was seen and examined on 11/27/2023 PCP: Plotnikov, Karlynn GAILS, MD  Patient coming from: Home via EMS  Chief Complaint:  Chief Complaint  Patient presents with   Hypoglycemia   HPI: [name] [name] is a 83 y.o. female with medical history significant of hypertension, hyperlipidemia, hypothyroidism, history of breast cancer presents with episodes of hypoglycemia and episode of feeling like she was unable to move this morning.  She has been experiencing recent episodes of hypoglycemia despite using a continuous glucose monitor (CGM) on her arm.  She was seen in the emergency department 4 days ago due to her low blood sugars.  There is a noted discrepancy between the CGM readings and her fingerstick tests, with the CGM showing higher glucose levels. For instance, the CGM read 147 mg/dL while the fingerstick showed 77 mg/dL. The CGM has been in place for over a week, though she is unsure of the exact day it was applied. Her vision becomes 'fuzzy' when her blood sugar is low, particularly in the left eye.  She does not recall this happening last night but did not feel well.  This morning, she experienced an episode where she was unable to wake up, move her legs/legs, or open her eyes for approximately 20 minutes. During this time, she was incoherent and her husband was unable to wake her despite multiple attempts.    Emergency services were called By the time the fire department arrived, he notes that she is started coming to, but still had difficulty moving her hands.  EMS made note that patient had right-sided facial droop and right-sided weakness present on their evaluation.  She has been experiencing headaches across her forehead, frequent coughing, nasal congestion, and tinnitus. No recent exposure to sick individuals, except for visits to the doctor's office.  She had gone to see her  primary yesterday due to her symptoms and recent ED visit and was advised to stop Metformin  and Repaglinide , but continue Jardiance.  In the emergency department patient was noted to be afebrile with respirations 18-25, blood pressures elevated up to 142/85, and O2 saturations maintained.  Labs significant for hemoglobin 10.9, BUN 20, creatinine 1.43, glucose 188.  CT scan of the head did not reveal any acute abnormality.  Not a candidate for thrombolytics due to improvement in symptoms.   Review of Systems: As mentioned in the history of present illness. All other systems reviewed and are negative. 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)   HTN (hypertension)    Hyperlipidemia    Hypothyroidism    Personal history of chemotherapy 2002   Personal history of radiation therapy 2002   Psoriasis    S/P thyroidectomy 07/2005   2 cm largest diameter (1 other tiny focus)/ i-131 rx 99 [facility] 08/2005   Thyroid  cancer (HCC)    Papillary Stage 1 - Dr Kassie   Vitamin B12 deficiency 2009   Vitamin D  deficiency 2009   Past Surgical History:  Procedure Laterality Date   BREAST LUMPECTOMY     BREAST LUMPECTOMY WITH RADIOACTIVE SEED AND SENTINEL LYMPH NODE BIOPSY Left 09/11/2016   Procedure: LEFT BREAST  LUMPECTOMY WITH RADIOACTIVE SEED AND LEFT AXILLARY SENTINEL LYMPH NODE BIOPSY WITH BLUE DYE INJECTION;  Surgeon: Gail Favorite, MD;  Location: [facility];  Service: General;  Laterality: Left;   ESOPHAGEAL MANOMETRY N/A 10/08/2023   Procedure: MANOMETRY, ESOPHAGUS;  Surgeon: Federico Rosario BROCKS, MD;  Location: [facility];  Service: Gastroenterology;  Laterality: N/A;   IR FLUORO GUIDE PORT INSERTION RIGHT  09/13/2016   IR REMOVAL TUN ACCESS W/ PORT W/O FL  MOD SED  12/30/2016   IR US  GUIDE VASC ACCESS RIGHT  09/13/2016   MASTECTOMY Right    Right   PORTACATH PLACEMENT N/A 09/11/2016   Procedure: ATTEMPTED INSERTION PORT-A-CATH WITH ULTRA SOUND GUIDANCE;  Surgeon: Gail Favorite, MD;  Location: [facility];  Service: General;  Laterality: N/A;   REDUCTION MAMMAPLASTY Left 2005   THYROIDECTOMY  2007   Social History:  reports that she has never smoked. She has never used smokeless tobacco. She reports that she does not drink alcohol and does not use drugs.  Allergies  Allergen Reactions   Pneumococcal Vaccines Other (See Comments)    Was really sick    Citalopram      Insomnia   Metformin  And Related     Diarrhea   Sulfa Antibiotics Other (See Comments)    Pt believes it was hives   Tape Itching, Dermatitis and Rash   Tegaderm Ag Mesh [Silver] Itching and Rash    Family History  Problem Relation Age of Onset   Stroke Mother    Allergies Mother    Asthma Mother    Clotting disorder Mother    Heart disease Father 68       MI   Allergies Sister    Asthma Sister    Breast cancer Sister 55       Lymphoma 76; currently 58   Lymphoma Sister    Hyperparathyroidism Sister    Asthma Brother    Leukemia Brother        dx 47s   Allergies Daughter    Asthma Daughter    Allergies Sister    Breast cancer Sister 58       currently 45   Kidney cancer Paternal Aunt        kidney ca; deceased 21   Liver cancer Paternal Uncle        unk. primary (liver)   Throat cancer Maternal Grandmother        deceased 53   Lung cancer Maternal Grandfather        deceased 29   Pancreatic cancer Paternal Grandfather        deceased 4   Cancer Paternal Aunt        abdominal; deceased 58    Prior to Admission medications   Medication Sig Start Date End Date Taking? Authorizing Provider  acetaminophen  (TYLENOL ) 650 MG CR tablet Take 650 mg by mouth every 8 (eight) hours as needed for pain.   Yes [provider]   ALPRAZolam  (XANAX ) 1 MG tablet Take 1 tablet (1 mg total) by mouth 3 (three) times daily as needed for anxiety. 08/06/23  Yes Plotnikov, Karlynn GAILS, MD  anastrozole  (ARIMIDEX ) 1 MG tablet TAKE 1 TABLET BY MOUTH EVERY DAY 09/09/23  Yes Causey, Lindsey Cornetto, NP  Ascorbic Acid (VITAMIN C PO) Take 1 tablet by mouth daily.   Yes [provider]  Camphor-Menthol-Methyl Sal (SALONPAS) 3.02-17-08 % PTCH Apply 1 patch topically daily as needed (pain).   Yes [provider]  carboxymethylcellulose  1 % ophthalmic solution Place 1 drop into both eyes daily as needed (dry eyes).   Yes [provider]  clobetasol  ointment (TEMOVATE ) 0.05 % Apply 1 Application topically 2 (two) times daily. rash 10/15/23  Yes Plotnikov, Karlynn GAILS, MD  clopidogrel  (PLAVIX ) 75 MG tablet Take 1 tablet (75 mg total) by mouth daily. 09/09/23  Yes Plotnikov, Aleksei V, MD  empagliflozin (JARDIANCE) 10 MG TABS tablet Take 1 tablet (10 mg total) by mouth daily. 11/19/23  Yes Plotnikov, Aleksei V, MD  erythromycin ophthalmic ointment Place 1 Application into the left eye daily.   Yes [provider]  MAGNESIUM PO Take 1 tablet by mouth daily.   Yes [provider]  metoprolol  succinate (TOPROL -XL) 25 MG 24 hr tablet Take 12.5 mg by mouth at bedtime. 06/03/23  Yes [provider]  prednisoLONE acetate (PRED FORTE) 1 % ophthalmic suspension Place 1 drop into both eyes at bedtime as needed (For clear vision). 04/15/23  Yes [provider]  repaglinide  (PRANDIN ) 2 MG tablet Take 1 tablet (2 mg total) by mouth 3 (three) times daily before meals. 11/03/23  Yes Plotnikov, Aleksei V, MD  vitamin B-12 (CYANOCOBALAMIN ) 1000 MCG tablet Take 1,000 mcg by mouth daily.   Yes [provider]  Blood Glucose Monitoring Suppl DEVI 1 each by Does not apply route in the morning, at noon, and at bedtime. May substitute to any manufacturer covered by patient's insurance. 10/15/23   Plotnikov, Karlynn GAILS, MD  Continuous Glucose Receiver (DEXCOM G7 RECEIVER) DEVI Use as directed 11/03/23   Plotnikov, Aleksei V, MD  Continuous Glucose Sensor (DEXCOM G7 SENSOR) MISC Replace q 10 days 11/03/23   Plotnikov, Aleksei V, MD  donepezil  (ARICEPT ) 5 MG tablet Take 1 tablet (5 mg total) by mouth at bedtime. 07/21/23   Plotnikov, Aleksei V, MD  Glucagon (GVOKE HYPOPEN 1-PACK) 1 MG/0.2ML SOAJ Inject 0.2 mLs into the skin daily as needed. 11/26/23   Plotnikov, Karlynn GAILS, MD  levothyroxine  (SYNTHROID ) 112 MCG tablet TAKE 1 TABLET BY MOUTH EVERY DAY 11/28/22   Plotnikov, Karlynn GAILS, MD  rosuvastatin  (CRESTOR ) 20 MG tablet TAKE 1 TABLET BY MOUTH EVERY DAY 03/27/23   Plotnikov, Karlynn GAILS, MD    Physical Exam: Vitals:   11/27/23 0925 11/27/23 0930 11/27/23 1105  BP: (!) 141/86 (!) 142/85 116/77  Pulse: 91 92 90  Resp: 18 20 (!) 25  Temp: 97.7 F (36.5 C)    TempSrc: Oral    SpO2: 100% 99% 97%   Constitutional: Elderly female currently in no acute distress Eyes: PERRL, lids and conjunctivae normal ENMT: Mucous membranes are moist.  Frontal sinus tenderness to palpation.  For the most part turbinates appear to be pale boggy   Neck: normal, supple,   Respiratory: clear to auscultation bilaterally, no wheezing, no crackles. Normal respiratory effort. No accessory muscle use.  Cardiovascular: Regular rate and rhythm, soft 2/6 systolic murmur present. No extremity edema. 2+ pedal pulses. No carotid bruits.  Abdomen: no tenderness, no masses palpated. No hepatosplenomegaly. Bowel sounds positive.  Musculoskeletal: no clubbing / cyanosis. No joint deformity upper and lower extremities. Good ROM, no contractures. Normal muscle tone.  Skin: no rashes, lesions, ulcers. No induration Neurologic: CN 2-12 grossly intact. Sensation intact, DTR normal. Strength 5/5 in all 4.  Psychiatric: Normal judgment and insight. Alert and oriented x 3. Normal mood.   Data Reviewed:   Reviewed labs, imaging, and pertinent records as  documented.  Assessment and Plan: Stroke-like symptoms Patient presents  with reports of inability to move the morning lasting approximately 20 minutes and slowly improving thereafter, but patient still reports feeling weak.  Last known well was yesterday evening prior to going to sleep.  CT scan of the head did not note any acute abnormality.  Question possibility of a TIA/stroke. - Admit to a telemetry bed - Stroke order set utilized - Neurochecks - Check urinalysis  - Check MRI of the brain - Check echocardiogram - PT/OT/speech to evaluate  Diabetes mellitus type 2 with hypoglycemia, without long-term use of insulin Patient reports having recurrent low blood sugars.  Blood sugars currently maintained.  Last hemoglobin A1c noted to be 7.8 when checked on 09/02/2023.  Patient reports significant discrepancies with Dexcom and manual glucose checks.  Recently seen by primary who recommended stopping Metformin  and Repaglinide , but continuing on Jardiance. - Hypoglycemic protocols - Check hemoglobin A1c  - Continue Jardiance - CBGs before every meal and at bedtime to monitor blood sugars - Diabetic education consulted for hypoglycemia and issues with Dexcom  Sinus congestion Cough Patient reports having cough and sinus congestion over the last few days.  Noted to have frontal sinus tenderness to palpation.  White blood cell count within normal limits.  Influenza, COVID, and RSV screening were  negative 4 days ago.  Question possible viral etiology versus allergies versus bacterial. - Check complete 20 pathogen respiratory virus panel - Check chest x-ray - Check CRP - Flonase  - Mucinex  - Continue supportive care  Normocytic anemia Acute.  Hemoglobin noted to be 10.9 which appears around patient's baseline.  Hemoglobin previously noted to be 11.1-12.8.  Patient denies any reports of bleeding. - Recheck CBC tomorrow morning  Essential hypertension Blood pressures currently maintained  110/70 to 142/85. - Hold metoprolol  to allow for permissive hypertension in the process of ruling out the possibility of stroke  Chronic kidney disease stage IIIb Creatinine noted to be 1.43 which appears around patient's baseline. - Continue to monitor   Hypothyroidism TSH last noted to be 0.41 when checked on 11/22/2023. - Continue levothyroxine   Hyperparathyroidism Patient has been referred to endocrinology by her primary - Continue outpatient follow-up with endocrinology  Anxiety - Continue Xanax  as needed  History of breast cancer - Continue anastrozole   Memory loss - Continue donepezil   Hyperlipidemia - Continue Crestor   DVT prophylaxis: Lovenox  Advance Care Planning:   Code Status: Full Code    Consults: None  Family Communication: Husband updated at bedside  Severity of Illness: The appropriate patient status for this patient is OBSERVATION. Observation status is judged to be reasonable and necessary in order to provide the required intensity of service to ensure the patient's safety. The patient's presenting symptoms, physical exam findings, and initial radiographic and laboratory data in the context of their medical condition is felt to place them at decreased risk for further clinical deterioration. Furthermore, it is anticipated that the patient will be medically stable for discharge from the hospital within 2 midnights of admission.   Author: Maximino DELENA Sharps, MD 11/27/2023 11:54 AM  For on call review [email or web address].

## 2023-11-27 NOTE — Plan of Care (Signed)

## 2023-11-28 ENCOUNTER — Ambulatory Visit: Payer: Self-pay

## 2023-11-28 ENCOUNTER — Telehealth: Payer: Self-pay

## 2023-11-28 ENCOUNTER — Other Ambulatory Visit: Payer: Self-pay | Admitting: Internal Medicine

## 2023-11-28 ENCOUNTER — Encounter: Payer: Self-pay | Admitting: Oncology

## 2023-11-28 ENCOUNTER — Other Ambulatory Visit (HOSPITAL_COMMUNITY): Payer: Self-pay

## 2023-11-28 DIAGNOSIS — R2990 Unspecified symptoms and signs involving the nervous system: Secondary | ICD-10-CM | POA: Diagnosis not present

## 2023-11-28 LAB — URINALYSIS, ROUTINE W REFLEX MICROSCOPIC
Bilirubin Urine: NEGATIVE
Glucose, UA: NEGATIVE mg/dL
Hgb urine dipstick: NEGATIVE
Ketones, ur: NEGATIVE mg/dL
Nitrite: NEGATIVE
Protein, ur: NEGATIVE mg/dL
Specific Gravity, Urine: 1.012 (ref 1.005–1.030)
pH: 7 (ref 5.0–8.0)

## 2023-11-28 LAB — BASIC METABOLIC PANEL WITH GFR
Anion gap: 10 (ref 5–15)
BUN: 17 mg/dL (ref 8–23)
CO2: 24 mmol/L (ref 22–32)
Calcium: 9 mg/dL (ref 8.9–10.3)
Chloride: 104 mmol/L (ref 98–111)
Creatinine, Ser: 1.22 mg/dL — ABNORMAL HIGH (ref 0.44–1.00)
GFR, Estimated: 44 mL/min — ABNORMAL LOW (ref >60)
Glucose, Bld: 110 mg/dL — ABNORMAL HIGH (ref 70–99)
Potassium: 4.4 mmol/L (ref 3.5–5.1)
Sodium: 138 mmol/L (ref 135–145)

## 2023-11-28 LAB — GLUCOSE, CAPILLARY
Glucose-Capillary: 100 mg/dL — ABNORMAL HIGH (ref 70–99)
Glucose-Capillary: 131 mg/dL — ABNORMAL HIGH (ref 70–99)
Glucose-Capillary: 111 mg/dL — ABNORMAL HIGH (ref 70–99)

## 2023-11-28 LAB — CBC
HCT: 32.3 % — ABNORMAL LOW (ref 36.0–46.0)
Hemoglobin: 9.9 g/dL — ABNORMAL LOW (ref 12.0–15.0)
MCH: 25.9 pg — ABNORMAL LOW (ref 26.0–34.0)
MCHC: 30.7 g/dL (ref 30.0–36.0)
MCV: 84.6 fL (ref 80.0–100.0)
Platelets: 293 K/uL (ref 150–400)
RBC: 3.82 MIL/uL — ABNORMAL LOW (ref 3.87–5.11)
RDW: 18.7 % — ABNORMAL HIGH (ref 11.5–15.5)
WBC: 5.8 K/uL (ref 4.0–10.5)
nRBC: 0 % (ref 0.0–0.2)

## 2023-11-28 LAB — LIPID PANEL
Cholesterol: 112 mg/dL (ref 0–200)
HDL: 30 mg/dL — ABNORMAL LOW (ref >40)
LDL Cholesterol: 53 mg/dL (ref 0–99)
Total CHOL/HDL Ratio: 3.7 ratio
Triglycerides: 145 mg/dL (ref <150)
VLDL: 29 mg/dL (ref 0–40)

## 2023-11-28 MED ORDER — CEPHALEXIN 500 MG PO CAPS
500.0000 mg | ORAL_CAPSULE | Freq: Two times a day (BID) | ORAL | 0 refills | Status: AC
  Filled 2023-11-28: qty 8, 4d supply, fill #0

## 2023-11-28 NOTE — Evaluation (Signed)
 Physical Therapy Brief Evaluation and Discharge Note Patient Details Name: [name] [name] MRN: [id number] DOB: 1940-04-20 Today's Date: 11/28/2023   History of Present Illness  Patient is an 83 yo female presenting to the ED with lethargy, R sided facial droop and R sided weakness on 11/27/23. Patient with low CBG. Admitted with stroke-like symptoms, CT clear, MRI with no acute abnormality, but showing extensive chronic microvascular ischemic changes. PMH: breast cancer, HTN, HLD, depression, B12 deficiency, thyroid  cancer, lt patellar fx July 2025  Clinical Impression  Pt doing fairly well with mobility and ready for dc home from PT standpoint. Recommend OPPT to address higher level balance as well as strength and activity tolerance.        PT Assessment All further PT needs can be met in the next venue of care  Assistance Needed at Discharge  PRN    Equipment Recommendations None recommended by PT  Recommendations for Other Services       Precautions/Restrictions Precautions Precautions: Fall Recall of Precautions/Restrictions: Intact Restrictions Weight Bearing Restrictions Per Provider Order: No        Mobility  Bed Mobility Rolling: Independent Supine/Sidelying to sit: Independent Sit to supine/sidelying: Independent    Transfers Overall transfer level: Modified independent Equipment used: None                    Ambulation/Gait Ambulation/Gait assistance: Supervision Gait Distance (Feet): 250 Feet Assistive device: None, Rollator (4 wheels) Gait Pattern/deviations: Step-through pattern, Decreased stride length Gait Speed: Below normal General Gait Details: Seeking UE support at times for confidence  Home Activity Instructions Home Activity Instructions: Instructed to perform repeat sit to stand at meals and backward stepping at counter.  Stairs            Modified Rankin (Stroke Patients Only)        Balance Overall balance  assessment: Needs assistance Sitting-balance support: No upper extremity supported, Feet supported Sitting balance-Leahy Scale: Normal     Standing balance support: No upper extremity supported, During functional activity Standing balance-Leahy Scale: Good            Pertinent Vitals/Pain PT - Brief Vital Signs All Vital Signs Stable: Yes Pain Assessment Pain Assessment: No/denies pain     Home Living Family/patient expects to be discharged to:: Private residence Living Arrangements: Spouse/significant other Available Help at Discharge: Available 24 hours/day Home Environment: Stairs to enter;Rail - right  Stairs-Number of Steps: 3 Home Equipment: Wheelchair - Forensic psychologist (2 wheels);Cane - single point;BSC/3in1;Shower seat - built in        Prior Function Level of Independence: Independent      UE/LE Assessment   UE ROM/Strength/Tone/Coordination:  (defer to OT)    LE ROM/Strength/Tone/Coordination: Generalized weakness      Communication   Communication Communication: No apparent difficulties     Cognition Overall Cognitive Status: Appears within functional limits for tasks assessed/performed       General Comments      Exercises Other Exercises Other Exercises: Repeat sit to stand x 5 Other Exercises: Backwards walking at countertop x 30 sec   Assessment/Plan    PT Problem List Decreased strength;Decreased mobility;Decreased activity tolerance;Decreased balance       PT Visit Diagnosis Unsteadiness on feet (R26.81);Muscle weakness (generalized) (M62.81);History of falling (Z91.81)    No Skilled PT     Co-evaluation                AMPAC 6 Clicks Help needed turning from your back  to your side while in a flat bed without using bedrails?: None Help needed moving from lying on your back to sitting on the side of a flat bed without using bedrails?: None Help needed moving to and from a bed to a chair (including a wheelchair)?:  None Help needed standing up from a chair using your arms (e.g., wheelchair or bedside chair)?: None Help needed to walk in hospital room?: A Little Help needed climbing 3-5 steps with a railing? : A Little 6 Click Score: 22      End of Session   Activity Tolerance: Patient tolerated treatment well Patient left: in bed;with call bell/phone within reach;with bed alarm set   PT Visit Diagnosis: Unsteadiness on feet (R26.81);Muscle weakness (generalized) (M62.81);History of falling (Z91.81)     Time: 8882-8861 PT Time Calculation (min) (ACUTE ONLY): 21 min  Charges:   PT Evaluation $PT Eval Low Complexity: 1 Low      [facility] PT Acute Rehabilitation Services Office [phone]   Rodgers ORN [facility]  11/28/2023, 12:05 PM

## 2023-11-28 NOTE — Telephone Encounter (Signed)
 Pt VM is full... Please inform pt of the following per her PCP Please use glucose tablets for low sugar as needed as we discussed.  Thanks

## 2023-11-28 NOTE — Telephone Encounter (Signed)
 Please use glucose tablets for low sugar as needed as we discussed.  Thanks

## 2023-11-28 NOTE — Telephone Encounter (Signed)
 FYI Only or Action Required?: FYI only for provider.  Patient was last seen in primary care on 11/26/2023 by Plotnikov, Karlynn GAILS, MD.  Called Nurse Triage reporting Advice Only.  Symptoms began today.  Interventions attempted: Prescription medications: jardiance, metformin  and repaglidine.  Symptoms are: unchanged.  Triage Disposition: Information or Advice Only Call  Patient/caregiver understands and will follow disposition?: Yes       Copied from CRM #[id number]. Topic: Clinical - Medication Question >> Nov 28, 2023  4:44 PM Sophia H wrote: Reason for CRM:  **Lots of confusion on her medications and what she should be taking /not. **  Patient has some questions regarding if PCP is getting her messages regarding medication. Advised they are being routed over awaiting response. Wanted to know what Gucagon is for - advised its used to increase blood glucose levels by stimulating the liver to release stored glucose into the blood stream. Has other questions regarding medications that she is taking and what they are for. Called [facility] but no one available to answer questions, sent to NT   Note from PCP 10/17 Please use glucose tablets for low sugar as needed as we discussed.  Thanks  Patient is under the impression of continuing to take thyroid  meds, memory meds, blood thinners, cancer pill & diabetes meds. Reason for Disposition  Caller has medicine question only, adult not sick, AND triager answers question  Answer Assessment - Initial Assessment Questions 1. NAME of MEDICINE: What medicine(s) are you calling about?     Jardiance, metformin , and repaglinide  2. QUESTION: What is your question? (e.g., double dose of medicine, side effect)    What questions should I take    Pt was admitted for low blood sugar. She wasn't sure what medications she should take. RN reviewed discharge instructions and she is only supposed to take jardiance until she sees Dr. Garald. Pt states  understanding and is able to repeat instructions.  Protocols used: Medication Question Call-A-AH

## 2023-11-28 NOTE — Plan of Care (Signed)

## 2023-11-28 NOTE — Telephone Encounter (Signed)
 Copied from CRM #[id number]. Topic: Clinical - Medical Advice >> Nov 28, 2023  1:14 PM Jasmin G wrote: Reason for CRM: Pt states that she's currently at the hospital and was told that her blood sugar levels are at 110. Pt requested a call back at [phone] to check if Dr. Garald would like to prescribe something to treat and/or discuss what would be the best course of action.

## 2023-11-28 NOTE — Telephone Encounter (Signed)
 Patient called back to check on the status of previous request and was advised her initial requesting is awaiting a provider response. Patient requested a call back with an update.  CB#[phone] (M)

## 2023-11-28 NOTE — TOC Transition Note (Addendum)
 Transition of Care [facility]) - Discharge Note   Patient Details  Name: [name] [name] MRN: [id number] Date of Birth: 04/12/1940  Transition of Care [facility] CM/SW Contact:  Andrez JULIANNA George, RN Phone Number: 11/28/2023, 11:28 AM   Clinical Narrative:     Pt is discharging home with self care. No needs per IP Care management.   1237: PT recommending outpatient therapy. Pt asked to attend [facility]. Referral sent and information on the AVS.  Final next level of care: Home/Self Care Barriers to Discharge: No Barriers Identified   Patient Goals and CMS Choice            Discharge Placement                       Discharge Plan and Services Additional resources added to the After Visit Summary for                                       Social Drivers of Health (SDOH) Interventions SDOH Screenings   Food Insecurity: No Food Insecurity (11/27/2023)  Housing: Low Risk  (11/27/2023)  Transportation Needs: No Transportation Needs (11/27/2023)  Utilities: Not At Risk (11/27/2023)  Alcohol Screen: Low Risk  (08/28/2023)  Depression (PHQ2-9): Low Risk  (10/28/2023)  Financial Resource Strain: Low Risk  (08/28/2023)  Physical Activity: Inactive (08/28/2023)  Social Connections: Socially Integrated (11/27/2023)  Stress: No Stress Concern Present (08/28/2023)  Tobacco Use: Low Risk  (11/27/2023)  Health Literacy: Adequate Health Literacy (08/28/2023)     Readmission Risk Interventions     No data to display

## 2023-11-28 NOTE — Evaluation (Signed)
 Occupational Therapy Evaluation Patient Details Name: [name] [name] MRN: [id number] DOB: 09-06-1940 Today's Date: 11/28/2023   History of Present Illness   Patient is an 83 yo female presenting to the ED with lethargy, R sided facial droop and R sided weakness on 11/27/23. Patient with low CBG. Admitted with stroke-like symptoms, CT clear, MRI with no acute abnormality, but showing extensive chronic microvascular ischemic changes. PMH: breast cancer, HTN, HLD, depression, B12 deficiency, thyroid  cancer     Clinical Impressions Prior to this admission, patient living with her spouse, did not use AD to ambulate, and does not drive after fracturing her patella in July 2025. Currently, patient is presenting with no deficits, and is back to her baseline. Patient handed off to PT at end of evaluation, OT will sign off at this time. Patient requires no OT at discharge, please re-consult if further acute needs arise.      If plan is discharge home, recommend the following:   Assist for transportation;Direct supervision/assist for medications management;Direct supervision/assist for financial management;Assistance with cooking/housework (initially)     Functional Status Assessment   Patient has not had a recent decline in their functional status     Equipment Recommendations   None recommended by OT     Recommendations for Other Services         Precautions/Restrictions   Precautions Precautions: Fall Restrictions Weight Bearing Restrictions Per Provider Order: No     Mobility Bed Mobility Overal bed mobility: Independent                  Transfers Overall transfer level: Modified independent Equipment used: None               General transfer comment: able to complete toilet transfer without any assist      Balance Overall balance assessment: Mild deficits observed, not formally tested                                          ADL either performed or assessed with clinical judgement   ADL Overall ADL's : At baseline                                       General ADL Comments: Prior to this admission, patient living with her spouse, did not use AD to ambulate, and does not drive after fracturing her patella in July 2025. Currently, patient is presenting with no deficits, and is back to her baseline. Patient handed off to PT at end of evaluation, OT will sign off at this time. Patient requires no OT at discharge, please re-consult if further acute needs arise.     Vision Baseline Vision/History: 6 Macular Degeneration;3 Glaucoma Ability to See in Adequate Light: 0 Adequate Patient Visual Report: No change from baseline Vision Assessment?: Yes Eye Alignment: Within Functional Limits Ocular Range of Motion: Within Functional Limits Alignment/Gaze Preference: Within Defined Limits Tracking/Visual Pursuits: Able to track stimulus in all quads without difficulty Saccades: Within functional limits Convergence: Within functional limits Visual Fields: No apparent deficits     Perception Perception: Not tested       Praxis Praxis: Not tested       Pertinent Vitals/Pain Pain Assessment Pain Assessment: No/denies pain     Extremity/Trunk Assessment Upper Extremity Assessment Upper Extremity Assessment:  Overall [facility] for tasks assessed;Right hand dominant   Lower Extremity Assessment Lower Extremity Assessment: Defer to PT evaluation   Cervical / Trunk Assessment Cervical / Trunk Assessment: Kyphotic (minimally)   Communication Communication Communication: No apparent difficulties   Cognition Arousal: Alert Behavior During Therapy: WFL for tasks assessed/performed Cognition: No apparent impairments                               Following commands: Intact       Cueing  General Comments   Cueing Techniques: Verbal cues  VSS on RA   Exercises     Shoulder  Instructions      Home Living Family/patient expects to be discharged to:: Private residence Living Arrangements: Spouse/significant other Available Help at Discharge: Available 24 hours/day Type of Home: House Home Access: Stairs to enter [employer] of Steps: 3 Entrance Stairs-Rails: Right Home Layout: One level     Bathroom Shower/Tub: Tub/shower unit;Walk-in shower   Bathroom Toilet: Standard     Home Equipment: Wheelchair - Forensic psychologist (2 wheels);Cane - single point;BSC/3in1;Shower seat - built in          Prior Functioning/Environment Prior Level of Function : Independent/Modified Independent;History of Falls (last six months)             Mobility Comments: felll and broke her kneecap in July 25 ADLs Comments: independent, does not drive currently    OT Problem List: Decreased activity tolerance   OT Treatment/Interventions:        OT Goals(Current goals can be found in the care plan section)   Acute Rehab OT Goals Patient Stated Goal: to go home OT Goal Formulation: With patient Time For Goal Achievement: 12/12/23 Potential to Achieve Goals: Good   OT Frequency:       Co-evaluation              AM-PAC OT 6 Clicks Daily Activity     Outcome Measure Help from another person eating meals?: None Help from another person taking care of personal grooming?: None Help from another person toileting, which includes using toliet, bedpan, or urinal?: None Help from another person bathing (including washing, rinsing, drying)?: None Help from another person to put on and taking off regular upper body clothing?: None Help from another person to put on and taking off regular lower body clothing?: None 6 Click Score: 24   End of Session Nurse Communication: Mobility status  Activity Tolerance: Patient tolerated treatment well Patient left: in bed;Other (comment) (sitting EOB with PT)  OT Visit Diagnosis: Other abnormalities of  gait and mobility (R26.89);Muscle weakness (generalized) (M62.81)                Time: 8940-8880 OT Time Calculation (min): 20 min Charges:  OT General Charges $OT Visit: 1 Visit OT Evaluation $OT Eval Moderate Complexity: 1 Mod  Ronal Gift E. Trica Usery, OTR/L Acute Rehabilitation Services [phone]   Ronal Gift Salt 11/28/2023, 12:57 PM

## 2023-11-28 NOTE — Discharge Summary (Addendum)
 Physician Discharge Summary   Patient: [name] [name] MRN: [id number] DOB: 1940-08-28  Admit date:     11/27/2023  Discharge date: 11/28/23  Discharge Physician: Carliss LELON Canales   PCP: Garald Karlynn GAILS, MD   Recommendations at discharge:    Pt to be discharged home.   If you experience worsening fever, chills, chest pain, shortness of breath, or other concerning symptoms, please call your PCP or go to the emergency department immediately.  ADDENDUM - added keflex  500mg  BID for bacteruria.  Discharge Diagnoses: Principal Problem:   Stroke-like symptoms Active Problems:   Type 2 diabetes mellitus with hypoglycemia (HCC)   Sinus congestion   Cough   Normocytic anemia   Hypertension associated with diabetes (HCC)   Chronic kidney disease, stage 3b (HCC)   Hypothyroidism   Hyperparathyroidism   Anxiety disorder   History of breast cancer   Dyslipidemia  Resolved Problems:   * No resolved hospital problems. *   Hospital Course:  83 y.o. female with medical history significant of hypertension, hyperlipidemia, hypothyroidism, history of breast cancer presents with episodes of hypoglycemia and episode of feeling like she was unable to move this morning.   She has been experiencing recent episodes of hypoglycemia despite using a continuous glucose monitor (CGM) on her arm.  She was seen in the emergency department 4 days ago due to her low blood sugars.  There is a noted discrepancy between the CGM readings and her fingerstick tests, with the CGM showing higher glucose levels. For instance, the CGM read 147 mg/dL while the fingerstick showed 77 mg/dL. The CGM has been in place for over a week, though she is unsure of the exact day it was applied. Her vision becomes 'fuzzy' when her blood sugar is low, particularly in the left eye.  She does not recall this happening last night but did not feel well.   This morning, she experienced an episode where she was unable to wake up, move  her legs/legs, or open her eyes for approximately 20 minutes. During this time, she was incoherent and her husband was unable to wake her despite multiple attempts.    Emergency services were called By the time the fire department arrived, he notes that she is started coming to, but still had difficulty moving her hands.  EMS made note that patient had right-sided facial droop and right-sided weakness present on their evaluation.   She has been experiencing headaches across her forehead, frequent coughing, nasal congestion, and tinnitus. No recent exposure to sick individuals, except for visits to the doctor's office.  She had gone to see her primary yesterday due to her symptoms and recent ED visit and was advised to stop Metformin  and Repaglinide , but continue Jardiance.   In the emergency department patient was noted to be afebrile with respirations 18-25, blood pressures elevated up to 142/85, and O2 saturations maintained.  Labs significant for hemoglobin 10.9, BUN 20, creatinine 1.43, glucose 188.  CT scan of the head did not reveal any acute abnormality.  Not a candidate for thrombolytics due to improvement in symptoms.  Assessment and Plan:  Concern for CVA/TIA - Reported inability to move prior to presentation.  CT scan negative.  MRI negative for acute stroke, showing chronic ischemic changes.  Likely related to hypoglycemia.  Appears resolved.  Patient describes what she feels is a sleep paralysis.  Diabetes mellitus with hypoglycemia - Does not use insulin at home but was on metformin , repaglinide , Jardiance.  Problems with home Dexcom  and replacement with paring.  Likely giving false readings.  Recommend discontinuing repaglinide , metformin .  Continue Jardiance for now.  Monitor serial glucoses with fingersticks, discontinue continuous glucose monitor for now.  Follow-up closely with PCP and pursue endocrinology (PCP already initiated).  Patient counseled on the effects/symptoms of  hypoglycemia and strategies to prevent and correct.  Hypertension - Okay to resume home medication regiment.  Hyperparathyroidism - Referred to endocrinology by her PCP.    Consultants: None Procedures performed: None Disposition: Home Diet recommendation:  Discharge Diet Orders (From admission, onward)     Start     Ordered   11/28/23 0000  Diet - low sodium heart healthy        11/28/23 1109           Cardiac and Carb modified diet  DISCHARGE MEDICATION: Allergies as of 11/28/2023       Reactions   Pneumococcal Vaccines Other (See Comments)   Was really sick    Citalopram     Insomnia   Metformin  And Related    Diarrhea   Sulfa Antibiotics Other (See Comments)   Pt believes it was hives   Tape Itching, Dermatitis, Rash   Tegaderm Ag Mesh [silver] Itching, Rash        Medication List     STOP taking these medications    repaglinide  2 MG tablet Commonly known as: PRANDIN        TAKE these medications    acetaminophen  650 MG CR tablet Commonly known as: TYLENOL  Take 650 mg by mouth every 8 (eight) hours as needed for pain.   ALPRAZolam  1 MG tablet Commonly known as: XANAX  Take 1 tablet (1 mg total) by mouth 3 (three) times daily as needed for anxiety.   anastrozole  1 MG tablet Commonly known as: ARIMIDEX  TAKE 1 TABLET BY MOUTH EVERY DAY   Blood Glucose Monitoring Suppl Devi 1 each by Does not apply route in the morning, at noon, and at bedtime. May substitute to any manufacturer covered by patient's insurance.   carboxymethylcellulose 1 % ophthalmic solution Place 1 drop into both eyes daily as needed (dry eyes).   cephALEXin  500 MG capsule Commonly known as: KEFLEX  Take 1 capsule (500 mg total) by mouth 2 (two) times daily for 4 days.   clobetasol  ointment 0.05 % Commonly known as: TEMOVATE  Apply 1 Application topically 2 (two) times daily. rash   clopidogrel  75 MG tablet Commonly known as: PLAVIX  Take 1 tablet (75 mg total) by mouth  daily.   cyanocobalamin  1000 MCG tablet Commonly known as: VITAMIN B12 Take 1,000 mcg by mouth daily.   Dexcom G7 Receiver Devi Use as directed   [employer] Sensor Misc Replace q 10 days   donepezil  5 MG tablet Commonly known as: ARICEPT  Take 1 tablet (5 mg total) by mouth at bedtime.   empagliflozin 10 MG Tabs tablet Commonly known as: Jardiance Take 1 tablet (10 mg total) by mouth daily.   erythromycin ophthalmic ointment Place 1 Application into the left eye daily.   Gvoke HypoPen 1-Pack 1 MG/0.2ML Soaj Generic drug: Glucagon Inject 0.2 mLs into the skin daily as needed.   levothyroxine  112 MCG tablet Commonly known as: SYNTHROID  TAKE 1 TABLET BY MOUTH EVERY DAY   MAGNESIUM PO Take 1 tablet by mouth daily.   metoprolol  succinate 25 MG 24 hr tablet Commonly known as: TOPROL -XL Take 12.5 mg by mouth at bedtime.   prednisoLONE acetate 1 % ophthalmic suspension Commonly known as: PRED FORTE Place 1 drop  into both eyes at bedtime as needed (For clear vision).   rosuvastatin  20 MG tablet Commonly known as: CRESTOR  TAKE 1 TABLET BY MOUTH EVERY DAY   Salonpas 3.02-17-08 % Ptch Generic drug: Camphor-Menthol-Methyl Sal Apply 1 patch topically daily as needed (pain).   VITAMIN C PO Take 1 tablet by mouth daily.         Discharge Exam: There were no vitals filed for this visit.  GENERAL:  Alert, pleasant, no acute distress  HEENT:  EOMI CARDIOVASCULAR:  RRR, no murmurs appreciated RESPIRATORY:  Clear to auscultation, no wheezing, rales, or rhonchi GASTROINTESTINAL:  Soft, nontender, nondistended EXTREMITIES:  No LE edema bilaterally NEURO:  No new focal deficits appreciated SKIN:  No rashes noted PSYCH:  Appropriate mood and affect     Condition at discharge: improving  The results of significant diagnostics from this hospitalization (including imaging, microbiology, ancillary and laboratory) are listed below for reference.   Imaging Studies: MR BRAIN  WO CONTRAST Result Date: 11/27/2023 EXAM: MRI BRAIN WITHOUT CONTRAST 11/27/2023 03:34:36 PM TECHNIQUE: Multiplanar multisequence MRI of the head/brain was performed without the administration of intravenous contrast. COMPARISON: Same day CT head and MRI head 08/17/2021. CLINICAL HISTORY: Stroke, follow up. FINDINGS: BRAIN AND VENTRICLES: No acute infarct. No acute intracranial hemorrhage. No mass. No midline shift. No hydrocephalus. Confluent T2 FLAIR signal abnormality throughout the periventricular and subcortical white matter findings are compatible with extensive chronic microvascular ischemic changes. Additional signal abnormality in the pons related to the same. Generalized parenchymal volume loss. Chronic microhemorrhage involving the right dentate nucleus. The sella is unremarkable. Normal flow voids. ORBITS: Bilateral lens replacement. SINUSES AND MASTOIDS: No acute abnormality. BONES AND SOFT TISSUES: Normal marrow signal. No acute soft tissue abnormality. IMPRESSION: 1. No acute findings. 2. Extensive chronic microvascular ischemic changes. Electronically signed by: Donnice Mania MD 11/27/2023 04:46 PM EDT RP Workstation: [id number]   ECHOCARDIOGRAM COMPLETE Result Date: 11/27/2023    ECHOCARDIOGRAM REPORT   Patient Name:   RICKAYLA WIELAND Date of Exam: 11/27/2023 Medical Rec #:  [id number]       Height:       64.0 in Accession #:    [id number]      Weight:       145.0 lb Date of Birth:  August 01, 1940       BSA:          1.706 m Patient Age:    83 years        BP:           137/83 mmHg Patient Gender: F               HR:           87 bpm. Exam Location:  Inpatient Procedure: 2D Echo, Color Doppler and Cardiac Doppler (Both Spectral and Color            Flow Doppler were utilized during procedure). Indications:    Syncope R55  History:        Patient has prior history of Echocardiogram examinations, most                 recent 08/10/2020.  Sonographer:    Tinnie Gosling RDCS Referring Phys: [phone] RONDELL A  SMITH IMPRESSIONS  1. Left ventricular ejection fraction, by estimation, is 60 to 65%. The left ventricle has normal function. The left ventricle has no regional wall motion abnormalities. Left ventricular diastolic parameters are consistent with Grade I diastolic dysfunction (impaired relaxation).  2. Right ventricular  systolic function is mildly reduced. The right ventricular size is normal.  3. The mitral valve is normal in structure. Mild mitral valve regurgitation. No evidence of mitral stenosis.  4. The aortic valve is normal in structure. Aortic valve regurgitation is not visualized. No aortic stenosis is present.  5. The inferior vena cava is normal in size with greater than 50% respiratory variability, suggesting right atrial pressure of 3 mmHg. FINDINGS  Left Ventricle: Left ventricular ejection fraction, by estimation, is 60 to 65%. The left ventricle has normal function. The left ventricle has no regional wall motion abnormalities. The left ventricular internal cavity size was normal in size. There is  no left ventricular hypertrophy. Left ventricular diastolic parameters are consistent with Grade I diastolic dysfunction (impaired relaxation). Right Ventricle: The right ventricular size is normal. No increase in right ventricular wall thickness. Right ventricular systolic function is mildly reduced. Left Atrium: Left atrial size was normal in size. Right Atrium: Right atrial size was normal in size. Pericardium: There is no evidence of pericardial effusion. Presence of epicardial fat layer. Mitral Valve: The mitral valve is normal in structure. Mild mitral valve regurgitation. No evidence of mitral valve stenosis. Tricuspid Valve: The tricuspid valve is normal in structure. Tricuspid valve regurgitation is not demonstrated. No evidence of tricuspid stenosis. Aortic Valve: The aortic valve is normal in structure. Aortic valve regurgitation is not visualized. No aortic stenosis is present. Pulmonic Valve:  The pulmonic valve was normal in structure. Pulmonic valve regurgitation is not visualized. No evidence of pulmonic stenosis. Aorta: The aortic root is normal in size and structure. Venous: The inferior vena cava is normal in size with greater than 50% respiratory variability, suggesting right atrial pressure of 3 mmHg. IAS/Shunts: No atrial level shunt detected by color flow Doppler.  LEFT VENTRICLE PLAX 2D LVIDd:         3.50 cm   Diastology LVIDs:         1.80 cm   LV e' medial:    5.33 cm/s LV PW:         0.90 cm   LV E/e' medial:  13.7 LV IVS:        0.90 cm   LV e' lateral:   5.77 cm/s LVOT diam:     1.80 cm   LV E/e' lateral: 12.7 LV SV:         45 LV SV Index:   26 LVOT Area:     2.54 cm  RIGHT VENTRICLE            IVC RV S prime:     8.49 cm/s  IVC diam: 1.30 cm TAPSE (M-mode): 1.2 cm LEFT ATRIUM           Index        RIGHT ATRIUM          Index LA diam:      2.40 cm 1.41 cm/m   RA Area:     9.89 cm LA Vol (A4C): 33.7 ml 19.75 ml/m  RA Volume:   19.10 ml 11.19 ml/m  AORTIC VALVE LVOT Vmax:   98.90 cm/s LVOT Vmean:  61.900 cm/s LVOT VTI:    0.177 m  AORTA Ao Root diam: 2.90 cm Ao Asc diam:  2.70 cm MITRAL VALVE MV Area (PHT): 3.26 cm     SHUNTS MV Decel Time: 233 msec     Systemic VTI:  0.18 m MV E velocity: 73.10 cm/s   Systemic Diam: 1.80 cm MV A velocity:  122.00 cm/s MV E/A ratio:  0.60 Kardie Tobb DO Electronically signed by Dub Huntsman DO Signature Date/Time: 11/27/2023/4:03:39 PM    Final    DG CHEST PORT 1 VIEW Result Date: 11/27/2023 CLINICAL DATA:  Altered mental status. Generalized weakness. Dysarthria. EXAM: PORTABLE CHEST 1 VIEW COMPARISON:  11/22/2023 FINDINGS: Normal sized heart. Clear lungs with normal vascularity. Stable left breast surgical clips and right axillary surgical clips with postmastectomy changes on the right. Mild-to-moderate bilateral glenohumeral joint degenerative changes. IMPRESSION: No acute abnormality. Electronically Signed   By: Elspeth Bathe M.D.   On:  11/27/2023 13:52   CT HEAD WO CONTRAST ( ) Result Date: 11/27/2023 EXAM: CT HEAD WITHOUT CONTRAST 11/27/2023 10:13:06 AM TECHNIQUE: CT of the head was performed without the administration of intravenous contrast. Automated exposure control, iterative reconstruction, and/or weight based adjustment of the mA/kV was utilized to reduce the radiation dose to as low as reasonably achievable. COMPARISON: CT head 10/16/2023 CLINICAL HISTORY: Neuro deficit, acute, stroke suspected. Pt woke up this morning feeling lethargic, R sided facial droop and R sided weakness. FINDINGS: BRAIN AND VENTRICLES: No acute hemorrhage. No evidence of acute infarct. No hydrocephalus. No extra-axial collection. No mass effect or midline shift. Moderate white matter hypodensities, compatible with chronic microvascular ischemic change. ORBITS: No acute abnormality. SINUSES: No acute abnormality. SOFT TISSUES AND SKULL: No acute soft tissue abnormality. No skull fracture. IMPRESSION: 1. No acute intracranial abnormality. 2. Moderate chronic microvascular ischemic change. Electronically signed by: Gilmore Molt MD 11/27/2023 10:25 AM EDT RP Workstation: [id number]   DG Chest 2 View Result Date: 11/22/2023 CLINICAL DATA:  Cough. EXAM: CHEST - 2 VIEW COMPARISON:  04/30/2023. FINDINGS: The heart size and mediastinal contours are within normal limits. There is atherosclerotic calcification the aorta. No consolidation, effusion, or pneumothorax is seen. Scattered surgical clips are noted bilaterally. There is scoliosis of the thoracic spine with degenerative changes. No acute osseous abnormality. IMPRESSION: No active cardiopulmonary disease. Electronically Signed   By: Leita Birmingham M.D.   On: 11/22/2023 14:12    Microbiology: Results for orders placed or performed during the hospital encounter of 11/27/23  Respiratory (~20 pathogens) panel by PCR     Status: None   Collection Time: 11/27/23  5:28 PM   Specimen: Nasopharyngeal Swab;  Respiratory  Result Value Ref Range Status   Adenovirus NOT DETECTED NOT DETECTED Final   Coronavirus 229E NOT DETECTED NOT DETECTED Final    Comment: (NOTE) The Coronavirus on the Respiratory Panel, DOES NOT test for the novel  Coronavirus (2019 nCoV)    Coronavirus HKU1 NOT DETECTED NOT DETECTED Final   Coronavirus NL63 NOT DETECTED NOT DETECTED Final   Coronavirus OC43 NOT DETECTED NOT DETECTED Final   Metapneumovirus NOT DETECTED NOT DETECTED Final   Rhinovirus / Enterovirus NOT DETECTED NOT DETECTED Final   Influenza A NOT DETECTED NOT DETECTED Final   Influenza B NOT DETECTED NOT DETECTED Final   Parainfluenza Virus 1 NOT DETECTED NOT DETECTED Final   Parainfluenza Virus 2 NOT DETECTED NOT DETECTED Final   Parainfluenza Virus 3 NOT DETECTED NOT DETECTED Final   Parainfluenza Virus 4 NOT DETECTED NOT DETECTED Final   Respiratory Syncytial Virus NOT DETECTED NOT DETECTED Final   Bordetella pertussis NOT DETECTED NOT DETECTED Final   Bordetella Parapertussis NOT DETECTED NOT DETECTED Final   Chlamydophila pneumoniae NOT DETECTED NOT DETECTED Final   Mycoplasma pneumoniae NOT DETECTED NOT DETECTED Final    Comment: Performed at [facility], [address] [place], [place]  [address]   *Note: Due to a large number of results and/or encounters for the requested time period, some results have not been displayed. A complete set of results can be found in Results Review.    Labs: CBC: Recent Labs  Lab 11/22/23 1334 11/27/23 1016 11/28/23 0151  WBC 10.4 6.9 5.8  NEUTROABS 6.9  --   --   HGB 12.8 10.9* 9.9*  HCT 43.0 36.0 32.3*  MCV 87.0 87.0 84.6  PLT 409* 333 293   Basic Metabolic Panel: Recent Labs  Lab 11/22/23 1334 11/27/23 1016 11/28/23 0151  NA 138 136 138  K 4.9 4.4 4.4  CL 98 101 104  CO2 25 25 24   GLUCOSE 231* 188* 110*  BUN 16 20 17   CREATININE 1.59* 1.43* 1.22*  CALCIUM  10.5* 9.3 9.0   Liver Function Tests: Recent Labs  Lab 11/22/23 1334   AST 28  ALT 15  ALKPHOS 57  BILITOT 0.6  PROT 7.1  ALBUMIN 3.6   CBG: Recent Labs  Lab 11/27/23 1106 11/27/23 2203 11/28/23 0528 11/28/23 0739 11/28/23 1104  GLUCAP 143* 155* 100* 131* 111*    Discharge time spent: 28 minutes.  Length of inpatient stay: 0 days  Signed: Carliss LELON Canales, DO [facility] 11/28/2023

## 2023-11-28 NOTE — Progress Notes (Signed)
 11/28/2023 12:44 PM -----------------------------------------------------------[facility]--------------------------------------------------- D(Data) A(Action) R(response)     Data: Discharge Readiness Assessment EDD today 11/28/2023    Action: Chart reviewed    Response: No immediate Barriers to discharge identified at this time.     Calton Harshfield, RN [employer] [facility]

## 2023-11-30 ENCOUNTER — Other Ambulatory Visit: Payer: Self-pay | Admitting: Internal Medicine

## 2023-11-30 NOTE — Telephone Encounter (Signed)
 Please continue Jardiance 1 a day.  Thanks

## 2023-12-01 ENCOUNTER — Telehealth: Payer: Self-pay

## 2023-12-01 NOTE — Telephone Encounter (Signed)
 Spoke with the pt nad was able to inform her of providers advice. Pt has stated understanding.

## 2023-12-01 NOTE — Transitions of Care (Post Inpatient/ED Visit) (Signed)
 12/01/2023  Name: [name] [name] MRN: [id number] DOB: 1940/06/13  Today's TOC FU Call Status: Today's TOC FU Call Status:: Successful TOC FU Call Completed TOC FU Call Complete Date: 12/01/23 Patient's Name and Date of Birth confirmed.  Transition Care Management Follow-up Telephone Call Date of Discharge: 11/28/23 Discharge Facility: [facility]) Type of Discharge: Inpatient Admission Primary Inpatient Discharge Diagnosis:: hypoglycemia How have you been since you were released from the hospital?: Better Any questions or concerns?: No  Items Reviewed: Did you receive and understand the discharge instructions provided?: Yes Medications obtained,verified, and reconciled?: Yes (Medications Reviewed) Any new allergies since your discharge?: No Dietary orders reviewed?: Yes Do you have support at home?: Yes People in Home [RPT]: spouse  Medications Reviewed Today: Medications Reviewed Today     Reviewed by [name] Pan, LPN (Licensed Practical Nurse) on 12/01/23 at 1453  Med List Status: <None>   Medication Order Taking? Sig Documenting Provider Last Dose Status Informant  acetaminophen  (TYLENOL ) 650 MG CR tablet [id number] Yes Take 650 mg by mouth every 8 (eight) hours as needed for pain. [provider]  Active Self, Spouse/Significant Other, Pharmacy Records  ALPRAZolam  (XANAX ) 1 MG tablet [id number] Yes Take 1 tablet (1 mg total) by mouth 3 (three) times daily as needed for anxiety. Plotnikov, Aleksei V, MD  Active Self, Spouse/Significant Other, Pharmacy Records  anastrozole  (ARIMIDEX ) 1 MG tablet [id number] Yes TAKE 1 TABLET BY MOUTH EVERY DAY Causey, Morna Pickle, NP  Active Self, Spouse/Significant Other, Pharmacy Records           Med Note SANDIE, [name]   Thu Nov 27, 2023 11:54 AM) Pt confirmed Dr Garald told her to only take 6 meds and D/C other meds on 10/13 until pt seen by [facility]   Ascorbic Acid (VITAMIN C PO) [id number] Yes  Take 1 tablet by mouth daily. [provider]  Active Self, Spouse/Significant Other, Pharmacy Records  Blood Glucose Monitoring Suppl DEVI [id number] Yes 1 each by Does not apply route in the morning, at noon, and at bedtime. May substitute to any manufacturer covered by patient's insurance. Plotnikov, Aleksei V, MD  Active Self, Spouse/Significant Other, Pharmacy Records  Camphor-Menthol-Methyl Sal [facility]) 3.02-17-08 % [facility] [id number] Yes Apply 1 patch topically daily as needed (pain). [provider]  Active Self, Spouse/Significant Other, Pharmacy Records  carboxymethylcellulose 1 % ophthalmic solution [id number] Yes Place 1 drop into both eyes daily as needed (dry eyes). [provider]  Active Self, Spouse/Significant Other, Pharmacy Records  cephALEXin  (KEFLEX ) 500 MG capsule [id number] Yes Take 1 capsule (500 mg total) by mouth 2 (two) times daily for 4 days. Arlon Carliss ORN, DO  Active   clobetasol  ointment (TEMOVATE ) 0.05 % [id number] Yes Apply 1 Application topically 2 (two) times daily. rash Plotnikov, Aleksei V, MD  Active Self, Spouse/Significant Other, Pharmacy Records  clopidogrel  (PLAVIX ) 75 MG tablet [id number] Yes Take 1 tablet (75 mg total) by mouth daily. Plotnikov, Aleksei V, MD  Active Self, Spouse/Significant Other, Pharmacy Records           Med Note SANDIE, [name]   Thu Nov 27, 2023 11:56 AM) Pt confirmed Dr Garald told her to only take 6 meds and D/C other meds on 10/13 until pt seen by [facility]    Continuous Glucose Receiver WILMOT G7 [place]) ESPIRIDION [id number] Yes Use as directed Plotnikov, Aleksei V, MD  Active Self, Spouse/Significant Other, Pharmacy Records  Continuous Glucose Sensor (DEXCOM G7 SENSOR) MISC [id number] Yes Replace q  10 days Plotnikov, Aleksei V, MD  Active Self, Spouse/Significant Other, Pharmacy Records  denosumab  (PROLIA ) injection 60 mg [id number]   Crawford Morna Pickle, NP  Active   donepezil  (ARICEPT ) 5  MG tablet [id number] Yes Take 1 tablet (5 mg total) by mouth at bedtime. Plotnikov, Aleksei V, MD  Active Self, Spouse/Significant Other, Pharmacy Records           Med Note SANDIE, [name]   Thu Nov 27, 2023 11:56 AM) Pt confirmed Dr Garald told her to only take 6 meds and D/C other meds on 10/13 until pt seen by [facility]    empagliflozin (JARDIANCE) 10 MG TABS tablet [id number] Yes Take 1 tablet (10 mg total) by mouth daily. Plotnikov, Aleksei V, MD  Active Self, Spouse/Significant Other, Pharmacy Records  erythromycin ophthalmic ointment [id number] Yes Place 1 Application into the left eye daily. [provider]  Active Self, Spouse/Significant Other, Pharmacy Records  Glucagon (GVOKE HYPOPEN 1-PACK) 1 MG/0.2ML SOAJ [id number] Yes Inject 0.2 mLs into the skin daily as needed. Plotnikov, Aleksei V, MD  Active Self, Spouse/Significant Other, Pharmacy Records  levothyroxine  (SYNTHROID ) 112 MCG tablet [id number] Yes TAKE 1 TABLET BY MOUTH EVERY DAY Plotnikov, Aleksei V, MD  Active Self, Spouse/Significant Other, Pharmacy Records           Med Note SANDIE, [name]   Thu Nov 27, 2023 11:56 AM) Pt confirmed Dr Garald told her to only take 6 meds and D/C other meds on 10/13 until pt seen by [facility]    MAGNESIUM PO [id number] Yes Take 1 tablet by mouth daily. [provider]  Active Self, Spouse/Significant Other, Pharmacy Records  metoprolol  succinate (TOPROL -XL) 25 MG 24 hr tablet [id number] Yes Take 12.5 mg by mouth at bedtime. [provider]  Active Self, Spouse/Significant Other, Pharmacy Records  prednisoLONE acetate (PRED FORTE) 1 % ophthalmic suspension [id number] Yes Place 1 drop into both eyes at bedtime as needed (For clear vision). [provider]  Active Self, Spouse/Significant Other, Pharmacy Records  rosuvastatin  (CRESTOR ) 20 MG tablet [id number] Yes TAKE 1 TABLET BY MOUTH EVERY DAY Plotnikov, Aleksei V, MD  Active    vitamin B-12 (CYANOCOBALAMIN ) 1000 MCG tablet [id number] Yes Take 1,000 mcg by mouth daily. [provider]  Active Pharmacy Records, Self, Spouse/Significant Other            Home Care and Equipment/Supplies: Were Home Health Services Ordered?: NA Any new equipment or medical supplies ordered?: NA  Functional Questionnaire: Do you need assistance with bathing/showering or dressing?: No Do you need assistance with meal preparation?: No Do you need assistance with eating?: No Do you have difficulty maintaining continence: No Do you need assistance with getting out of bed/getting out of a chair/moving?: No Do you have difficulty managing or taking your medications?: No  Follow up appointments reviewed: PCP Follow-up appointment confirmed?: Yes Date of PCP follow-up appointment?: 12/03/23 Follow-up Provider: [facility] Follow-up appointment confirmed?: NA Do you need transportation to your follow-up appointment?: No Do you understand care options if your condition(s) worsen?: Yes-patient verbalized understanding    SIGNATURE Julian Lemmings, LPN [facility] Nurse Health Advisor Direct Dial [phone]

## 2023-12-03 ENCOUNTER — Ambulatory Visit: Admitting: Internal Medicine

## 2023-12-04 ENCOUNTER — Other Ambulatory Visit: Payer: Self-pay | Admitting: Internal Medicine

## 2023-12-04 MED ORDER — TRAZODONE HCL 50 MG PO TABS: 50.0000 mg | ORAL_TABLET | Freq: Every evening | ORAL | 5 refills | Status: DC | PRN

## 2023-12-04 MED ORDER — TRAZODONE HCL 50 MG PO TABS: 50.0000 mg | ORAL_TABLET | Freq: Every evening | ORAL | 5 refills | Status: AC | PRN

## 2023-12-04 NOTE — Addendum Note (Signed)
 Addended by: Ram Haugan V on: 12/04/2023 11:36 PM   Modules accepted: Orders

## 2023-12-04 NOTE — Telephone Encounter (Signed)
 We can try trazodone at night.  Prescription was emailed

## 2023-12-04 NOTE — Telephone Encounter (Signed)
 Patient states the app shows her the same number every time. Patient is sticking to doing finger sticks.

## 2023-12-08 ENCOUNTER — Ambulatory Visit: Payer: Self-pay

## 2023-12-08 NOTE — Telephone Encounter (Signed)
 FYI Only or Action Required?: FYI only for provider.  Patient was last seen in primary care on 11/26/2023 by Plotnikov, Karlynn GAILS, MD.  Called Nurse Triage reporting Nasal Congestion.  Symptoms began several days ago.  Interventions attempted: OTC medications: Robitussin, advil sinus.  Symptoms are: gradually worsening.  Triage Disposition: See PCP When Office is Open (Within 3 Days)  Patient/caregiver understands and will follow disposition?: Yes  Message from [place] J sent at 12/08/2023 12:02 PM EDT  Reason for Triage: sinage drain, since Saturday, sneezing ,nose running bad and when she gets up to go to the restroom it drains down her lips.   Reason for Disposition  [1] Sinus congestion (pressure, fullness) AND [2] present > 10 days  Answer Assessment - Initial Assessment Questions Patient has appt with Dr Garald on Weds. Having Sinus congestion, nasal drainage with mild cough since Saturday. Taking robitussin, and Advil sinus with sinus pressure relief. Can't get up and walk to restroom without waterfall of clear thin snot draining. Using humidifier, hot tea, and glucerna to help with her hydration and nutrition intake.   Memory is getting worse- Donepezil - asking if her memory meds could be increased.  1. LOCATION: Where does it hurt?      Nasal congestion 2. ONSET: When did the sinus pain start?  (e.g., hours, days)      Started Saturday  3. SEVERITY: How bad is the pain?   (Scale 0-10; or none, mild, moderate or severe)     Wasn't terribly uncomfortable - 3-4/10 pain  4. RECURRENT SYMPTOM: Have you ever had sinus problems before? If Yes, ask: When was the last time? and What happened that time?      Has had sinus issues in the past 5. NASAL CONGESTION: Is the nose blocked? If Yes, ask: Can you open it or must you breathe through your mouth?     Constant drainage-  6. NASAL DISCHARGE: Do you have discharge from your nose? If so ask, What color?      Clear drainage- just constant  7. FEVER: Do you have a fever? If Yes, ask: What is it, how was it measured, and when did it start?      denies 8. OTHER SYMPTOMS: Do you have any other symptoms? (e.g., sore throat, cough, earache, difficulty breathing)     Headache- advil sinus, cough, fatigue  Protocols used: Sinus Pain or Congestion-A-AH

## 2023-12-10 ENCOUNTER — Encounter: Payer: Self-pay | Admitting: Internal Medicine

## 2023-12-10 ENCOUNTER — Ambulatory Visit: Admitting: Internal Medicine

## 2023-12-10 ENCOUNTER — Telehealth: Payer: Self-pay

## 2023-12-10 VITALS — BP 102/60 | HR 97 | Temp 97.8°F | Ht 64.0 in

## 2023-12-10 DIAGNOSIS — E1122 Type 2 diabetes mellitus with diabetic chronic kidney disease: Secondary | ICD-10-CM

## 2023-12-10 DIAGNOSIS — E538 Deficiency of other specified B group vitamins: Secondary | ICD-10-CM

## 2023-12-10 DIAGNOSIS — E119 Type 2 diabetes mellitus without complications: Secondary | ICD-10-CM

## 2023-12-10 DIAGNOSIS — N1832 Chronic kidney disease, stage 3b: Secondary | ICD-10-CM | POA: Diagnosis not present

## 2023-12-10 DIAGNOSIS — G8929 Other chronic pain: Secondary | ICD-10-CM

## 2023-12-10 DIAGNOSIS — M545 Low back pain, unspecified: Secondary | ICD-10-CM

## 2023-12-10 DIAGNOSIS — R270 Ataxia, unspecified: Secondary | ICD-10-CM

## 2023-12-10 DIAGNOSIS — I679 Cerebrovascular disease, unspecified: Secondary | ICD-10-CM

## 2023-12-10 DIAGNOSIS — R269 Unspecified abnormalities of gait and mobility: Secondary | ICD-10-CM

## 2023-12-10 MED ORDER — IPRATROPIUM BROMIDE 0.06 % NA SOLN: 2.0000 | Freq: Three times a day (TID) | NASAL | 2 refills | Status: AC

## 2023-12-10 MED ORDER — CELECOXIB 200 MG PO CAPS: 200.0000 mg | ORAL_CAPSULE | Freq: Every day | ORAL | 1 refills | Status: DC | PRN

## 2023-12-10 NOTE — Assessment & Plan Note (Signed)
 Pt declined using a walker

## 2023-12-10 NOTE — Progress Notes (Signed)
 Subjective:  Patient ID: [name] [name], female    DOB: [birth year]  Age: 83 y.o. MRN: [id number]  CC: Medical Management of Chronic Issues (2 Week follow )   HPI [name] [name] presents for DM, dizziness and LBP/neck pain Doing better  Per Hx:  Admit date:     11/27/2023  Discharge date: 11/28/23  Discharge Physician: Carliss LELON Canales    PCP: Garald Karlynn GAILS, MD    Recommendations at discharge:     Pt to be discharged home.   If you experience worsening fever, chills, chest pain, shortness of breath, or other concerning symptoms, please call your PCP or go to the emergency department immediately.   ADDENDUM - added keflex  500mg  BID for bacteruria.   Discharge Diagnoses: Principal Problem:   Stroke-like symptoms Active Problems:   Type 2 diabetes mellitus with hypoglycemia (HCC)   Sinus congestion   Cough   Normocytic anemia   Hypertension associated with diabetes (HCC)   Chronic kidney disease, stage 3b (HCC)   Hypothyroidism   Hyperparathyroidism   Anxiety disorder   History of breast cancer   Dyslipidemia   Resolved Problems:   * No resolved hospital problems. *     Hospital Course:   83 y.o. female with medical history significant of hypertension, hyperlipidemia, hypothyroidism, history of breast cancer presents with episodes of hypoglycemia and episode of feeling like she was unable to move this morning.   She has been experiencing recent episodes of hypoglycemia despite using a continuous glucose monitor (CGM) on her arm.  She was seen in the emergency department 4 days ago due to her low blood sugars.  There is a noted discrepancy between the CGM readings and her fingerstick tests, with the CGM showing higher glucose levels. For instance, the CGM read 147 mg/dL while the fingerstick showed 77 mg/dL. The CGM has been in place for over a week, though she is unsure of the exact day it was applied. Her vision becomes 'fuzzy' when her blood sugar is low,  particularly in the left eye.  She does not recall this happening last night but did not feel well.   This morning, she experienced an episode where she was unable to wake up, move her legs/legs, or open her eyes for approximately 20 minutes. During this time, she was incoherent and her husband was unable to wake her despite multiple attempts.    Emergency services were called By the time the fire department arrived, he notes that she is started coming to, but still had difficulty moving her hands.  EMS made note that patient had right-sided facial droop and right-sided weakness present on their evaluation.   She has been experiencing headaches across her forehead, frequent coughing, nasal congestion, and tinnitus. No recent exposure to sick individuals, except for visits to the doctor's office.  She had gone to see her primary yesterday due to her symptoms and recent ED visit and was advised to stop Metformin  and Repaglinide , but continue Jardiance.   In the emergency department patient was noted to be afebrile with respirations 18-25, blood pressures elevated up to 142/85, and O2 saturations maintained.  Labs significant for hemoglobin 10.9, BUN 20, creatinine 1.43, glucose 188.  CT scan of the head did not reveal any acute abnormality.  Not a candidate for thrombolytics due to improvement in symptoms.   Assessment and Plan:   Concern for CVA/TIA - Reported inability to move prior to presentation.  CT scan negative.  MRI negative for  acute stroke, showing chronic ischemic changes.  Likely related to hypoglycemia.  Appears resolved.  Patient describes what she feels is a sleep paralysis.   Diabetes mellitus with hypoglycemia - Does not use insulin at home but was on metformin , repaglinide , Jardiance.  Problems with home Dexcom and replacement with paring.  Likely giving false readings.  Recommend discontinuing repaglinide , metformin .  Continue Jardiance for now.  Monitor serial glucoses with  fingersticks, discontinue continuous glucose monitor for now.  Follow-up closely with PCP and pursue endocrinology (PCP already initiated).  Patient counseled on the effects/symptoms of hypoglycemia and strategies to prevent and correct.   Hypertension - Okay to resume home medication regiment.   Hyperparathyroidism - Referred to endocrinology by her PCP.       Outpatient Medications Prior to Visit  Medication Sig Dispense Refill   acetaminophen  (TYLENOL ) 650 MG CR tablet Take 650 mg by mouth every 8 (eight) hours as needed for pain.     ALPRAZolam  (XANAX ) 1 MG tablet Take 1 tablet (1 mg total) by mouth 3 (three) times daily as needed for anxiety. 90 tablet 2   anastrozole  (ARIMIDEX ) 1 MG tablet TAKE 1 TABLET BY MOUTH EVERY DAY 90 tablet 1   Ascorbic Acid (VITAMIN C PO) Take 1 tablet by mouth daily.     Blood Glucose Monitoring Suppl DEVI 1 each by Does not apply route in the morning, at noon, and at bedtime. May substitute to any manufacturer covered by patient's insurance. 1 each 0   Camphor-Menthol-Methyl Sal (SALONPAS) 3.02-17-08 % PTCH Apply 1 patch topically daily as needed (pain).     carboxymethylcellulose 1 % ophthalmic solution Place 1 drop into both eyes daily as needed (dry eyes).     clobetasol  ointment (TEMOVATE ) 0.05 % Apply 1 Application topically 2 (two) times daily. rash 120 g 1   clopidogrel  (PLAVIX ) 75 MG tablet Take 1 tablet (75 mg total) by mouth daily. 90 tablet 3   Continuous Glucose Receiver (DEXCOM G7 RECEIVER) DEVI Use as directed 1 each 1   Continuous Glucose Sensor (DEXCOM G7 SENSOR) MISC Replace q 10 days 3 each 11   donepezil  (ARICEPT ) 5 MG tablet Take 1 tablet (5 mg total) by mouth at bedtime. 90 tablet 1   empagliflozin (JARDIANCE) 10 MG TABS tablet Take 1 tablet (10 mg total) by mouth daily. 30 tablet 11   erythromycin ophthalmic ointment Place 1 Application into the left eye daily.     Glucagon (GVOKE HYPOPEN 1-PACK) 1 MG/0.2ML SOAJ Inject 0.2 mLs into the  skin daily as needed. 0.4 mL 3   levothyroxine  (SYNTHROID ) 112 MCG tablet TAKE 1 TABLET BY MOUTH EVERY DAY 90 tablet 3   MAGNESIUM PO Take 1 tablet by mouth daily.     metoprolol  succinate (TOPROL -XL) 25 MG 24 hr tablet Take 12.5 mg by mouth at bedtime.     prednisoLONE acetate (PRED FORTE) 1 % ophthalmic suspension Place 1 drop into both eyes at bedtime as needed (For clear vision).     rosuvastatin  (CRESTOR ) 20 MG tablet TAKE 1 TABLET BY MOUTH EVERY DAY 90 tablet 1   traZODone (DESYREL) 50 MG tablet Take 1 tablet (50 mg total) by mouth at bedtime as needed for sleep. 30 tablet 5   vitamin B-12 (CYANOCOBALAMIN ) 1000 MCG tablet Take 1,000 mcg by mouth daily.     Facility-Administered Medications Prior to Visit  Medication Dose Route Frequency Provider Last Rate Last Admin   denosumab  (PROLIA ) injection 60 mg  60 mg Subcutaneous Once Causey,  Morna Pickle, NP        ROS: Review of Systems  Objective:  BP 102/60   Pulse 97   Temp 97.8 F (36.6 C)   Ht 5' 4 (1.626 m)   SpO2 98%   BMI 24.89 kg/m   BP Readings from Last 3 Encounters:  12/10/23 102/60  11/28/23 139/86  11/26/23 110/70    Wt Readings from Last 3 Encounters:  11/22/23 145 lb (65.8 kg)  11/11/23 145 lb 8 oz (66 kg)  11/03/23 146 lb 9.6 oz (66.5 kg)    Physical Exam  Lab Results  Component Value Date   WBC 5.8 11/28/2023   HGB 9.9 (L) 11/28/2023   HCT 32.3 (L) 11/28/2023   PLT 293 11/28/2023   GLUCOSE 110 (H) 11/28/2023   CHOL 112 11/28/2023   TRIG 145 11/28/2023   HDL 30 (L) 11/28/2023   LDLDIRECT 125.0 06/05/2015   LDLCALC 53 11/28/2023   ALT 15 11/22/2023   AST 28 11/22/2023   NA 138 11/28/2023   K 4.4 11/28/2023   CL 104 11/28/2023   CREATININE 1.22 (H) 11/28/2023   BUN 17 11/28/2023   CO2 24 11/28/2023   TSH 0.410 11/22/2023   INR 1.1 08/16/2021   HGBA1C 5.4 11/27/2023    MR BRAIN WO CONTRAST Result Date: 11/27/2023 EXAM: MRI BRAIN WITHOUT CONTRAST 11/27/2023 03:34:36 PM TECHNIQUE:  Multiplanar multisequence MRI of the head/brain was performed without the administration of intravenous contrast. COMPARISON: Same day CT head and MRI head 08/17/2021. CLINICAL HISTORY: Stroke, follow up. FINDINGS: BRAIN AND VENTRICLES: No acute infarct. No acute intracranial hemorrhage. No mass. No midline shift. No hydrocephalus. Confluent T2 FLAIR signal abnormality throughout the periventricular and subcortical white matter findings are compatible with extensive chronic microvascular ischemic changes. Additional signal abnormality in the pons related to the same. Generalized parenchymal volume loss. Chronic microhemorrhage involving the right dentate nucleus. The sella is unremarkable. Normal flow voids. ORBITS: Bilateral lens replacement. SINUSES AND MASTOIDS: No acute abnormality. BONES AND SOFT TISSUES: Normal marrow signal. No acute soft tissue abnormality. IMPRESSION: 1. No acute findings. 2. Extensive chronic microvascular ischemic changes. Electronically signed by: Donnice Mania MD 11/27/2023 04:46 PM EDT RP Workstation: [id number]   ECHOCARDIOGRAM COMPLETE Result Date: 11/27/2023    ECHOCARDIOGRAM REPORT   Patient Name:   [name] [name] Date of Exam: 11/27/2023 Medical Rec #:  [id number]       Height:       64.0 in Accession #:    [id number]      Weight:       145.0 lb Date of Birth:  02-26-1940       BSA:          1.706 m Patient Age:    83 years        BP:           137/83 mmHg Patient Gender: F               HR:           87 bpm. Exam Location:  Inpatient Procedure: 2D Echo, Color Doppler and Cardiac Doppler (Both Spectral and Color            Flow Doppler were utilized during procedure). Indications:    Syncope R55  History:        Patient has prior history of Echocardiogram examinations, most                 recent 08/10/2020.  Sonographer:  Tinnie Gosling RDCS Referring Phys: [id number] RONDELL A SMITH IMPRESSIONS  1. Left ventricular ejection fraction, by estimation, is 60 to 65%. The left ventricle  has normal function. The left ventricle has no regional wall motion abnormalities. Left ventricular diastolic parameters are consistent with Grade I diastolic dysfunction (impaired relaxation).  2. Right ventricular systolic function is mildly reduced. The right ventricular size is normal.  3. The mitral valve is normal in structure. Mild mitral valve regurgitation. No evidence of mitral stenosis.  4. The aortic valve is normal in structure. Aortic valve regurgitation is not visualized. No aortic stenosis is present.  5. The inferior vena cava is normal in size with greater than 50% respiratory variability, suggesting right atrial pressure of 3 mmHg. FINDINGS  Left Ventricle: Left ventricular ejection fraction, by estimation, is 60 to 65%. The left ventricle has normal function. The left ventricle has no regional wall motion abnormalities. The left ventricular internal cavity size was normal in size. There is  no left ventricular hypertrophy. Left ventricular diastolic parameters are consistent with Grade I diastolic dysfunction (impaired relaxation). Right Ventricle: The right ventricular size is normal. No increase in right ventricular wall thickness. Right ventricular systolic function is mildly reduced. Left Atrium: Left atrial size was normal in size. Right Atrium: Right atrial size was normal in size. Pericardium: There is no evidence of pericardial effusion. Presence of epicardial fat layer. Mitral Valve: The mitral valve is normal in structure. Mild mitral valve regurgitation. No evidence of mitral valve stenosis. Tricuspid Valve: The tricuspid valve is normal in structure. Tricuspid valve regurgitation is not demonstrated. No evidence of tricuspid stenosis. Aortic Valve: The aortic valve is normal in structure. Aortic valve regurgitation is not visualized. No aortic stenosis is present. Pulmonic Valve: The pulmonic valve was normal in structure. Pulmonic valve regurgitation is not visualized. No evidence of  pulmonic stenosis. Aorta: The aortic root is normal in size and structure. Venous: The inferior vena cava is normal in size with greater than 50% respiratory variability, suggesting right atrial pressure of 3 mmHg. IAS/Shunts: No atrial level shunt detected by color flow Doppler.  LEFT VENTRICLE PLAX 2D LVIDd:         3.50 cm   Diastology LVIDs:         1.80 cm   LV e' medial:    5.33 cm/s LV PW:         0.90 cm   LV E/e' medial:  13.7 LV IVS:        0.90 cm   LV e' lateral:   5.77 cm/s LVOT diam:     1.80 cm   LV E/e' lateral: 12.7 LV SV:         45 LV SV Index:   26 LVOT Area:     2.54 cm  RIGHT VENTRICLE            IVC RV S prime:     8.49 cm/s  IVC diam: 1.30 cm TAPSE (M-mode): 1.2 cm LEFT ATRIUM           Index        RIGHT ATRIUM          Index LA diam:      2.40 cm 1.41 cm/m   RA Area:     9.89 cm LA Vol (A4C): 33.7 ml 19.75 ml/m  RA Volume:   19.10 ml 11.19 ml/m  AORTIC VALVE LVOT Vmax:   98.90 cm/s LVOT Vmean:  61.900 cm/s LVOT VTI:  0.177 m  AORTA Ao Root diam: 2.90 cm Ao Asc diam:  2.70 cm MITRAL VALVE MV Area (PHT): 3.26 cm     SHUNTS MV Decel Time: 233 msec     Systemic VTI:  0.18 m MV E velocity: 73.10 cm/s   Systemic Diam: 1.80 cm MV A velocity: 122.00 cm/s MV E/A ratio:  0.60 Kardie Tobb DO Electronically signed by Dub Huntsman DO Signature Date/Time: 11/27/2023/4:03:39 PM    Final    DG CHEST PORT 1 VIEW Result Date: 11/27/2023 CLINICAL DATA:  Altered mental status. Generalized weakness. Dysarthria. EXAM: PORTABLE CHEST 1 VIEW COMPARISON:  11/22/2023 FINDINGS: Normal sized heart. Clear lungs with normal vascularity. Stable left breast surgical clips and right axillary surgical clips with postmastectomy changes on the right. Mild-to-moderate bilateral glenohumeral joint degenerative changes. IMPRESSION: No acute abnormality. Electronically Signed   By: Elspeth Bathe M.D.   On: 11/27/2023 13:52   CT HEAD WO CONTRAST ( ) Result Date: 11/27/2023 EXAM: CT HEAD WITHOUT CONTRAST 11/27/2023  10:13:06 AM TECHNIQUE: CT of the head was performed without the administration of intravenous contrast. Automated exposure control, iterative reconstruction, and/or weight based adjustment of the mA/kV was utilized to reduce the radiation dose to as low as reasonably achievable. COMPARISON: CT head 10/16/2023 CLINICAL HISTORY: Neuro deficit, acute, stroke suspected. Pt woke up this morning feeling lethargic, R sided facial droop and R sided weakness. FINDINGS: BRAIN AND VENTRICLES: No acute hemorrhage. No evidence of acute infarct. No hydrocephalus. No extra-axial collection. No mass effect or midline shift. Moderate white matter hypodensities, compatible with chronic microvascular ischemic change. ORBITS: No acute abnormality. SINUSES: No acute abnormality. SOFT TISSUES AND SKULL: No acute soft tissue abnormality. No skull fracture. IMPRESSION: 1. No acute intracranial abnormality. 2. Moderate chronic microvascular ischemic change. Electronically signed by: Gilmore Molt MD 11/27/2023 10:25 AM EDT RP Workstation: [id number]    Assessment & Plan:   Problem List Items Addressed This Visit     Ataxia   Pt declined using a walker      B12 deficiency - Primary   On B12      Cerebrovascular disease   Cont on Crestor , Plavix  po Stable      Chronic kidney disease, stage 3b (HCC)   Hydrate well      Diabetes mellitus type 2, noninsulin dependent (HCC)   Better control diabetes.  Use glucose tablets for low sugar Off jardiance 10 mg a day Do not take Metformin  and Repaglinide  for now Cont to monitor CBG.  Install application on your phone Diabetes education pending Endocrinology referral was pending      Gait disorder   Rolling walker was suggested for bad days      Low back pain   Worse Start Celebrex  w/caution (hydrate well, take with food)      Relevant Medications   celecoxib  (CELEBREX ) 200 MG capsule      Meds ordered this encounter  Medications   celecoxib  (CELEBREX )  200 MG capsule    Sig: Take 1 capsule (200 mg total) by mouth daily as needed for moderate pain (pain score 4-6).    Dispense:  30 capsule    Refill:  1   ipratropium (ATROVENT) 0.06 % nasal spray    Sig: Place 2 sprays into the nose 3 (three) times daily.    Dispense:  15 mL    Refill:  2      Follow-up: Return in about 2 months (around 02/09/2024) for a follow-up visit.  Marolyn Noel, MD

## 2023-12-10 NOTE — Patient Instructions (Addendum)
 USEFUL THINGS FOR ARTHRITIS and musculoskeletal pains:    A rice sock heating pad refers to a homemade heating pad created by filling a sock with uncooked rice, which can be heated in a microwave to provide a warm compress for sore muscles, pain relief, or other applications; essentially, it's a simple way to generate heat using readily available materials.  Key points about rice sock heat: How to make it: Fill a clean sock (preferably a tube sock) about 2/3 full with uncooked rice, tie a knot at the top to secure the rice inside.  Heating it up: Place the rice sock in the microwave and heat in short intervals (usually around 30 seconds at a time) until it reaches the desired warmth.  Important considerations: Check temperature before applying: Always test the temperature of the rice sock before applying it to your skin to avoid burns.  Use a towel to protect skin: Wrap the rice sock in a thin towel to distribute the heat evenly and protect your skin.  Uses: Muscle aches and pains  Menstrual cramps  Neck pain  Arthritis discomfort    BLUE EMU CREAM: Use it 2-3 times a day on painful areas    Rolling walker      Start Celebrex  w/caution (hydrate well, take with food)

## 2023-12-10 NOTE — Assessment & Plan Note (Addendum)
 Better control diabetes.  Use glucose tablets for low sugar Off jardiance 10 mg a day Do not take Metformin  and Repaglinide  for now Cont to monitor CBG.  Install application on your phone Diabetes education pending Endocrinology referral was pending

## 2023-12-10 NOTE — Assessment & Plan Note (Signed)
 Rolling walker was suggested for bad days

## 2023-12-10 NOTE — Telephone Encounter (Signed)
 Copied from CRM #[id number]. Topic: Clinical - Medication Question >> Dec 10, 2023  5:58 PM Alfonso HERO wrote: Reason for CRM: patient called because the pharmacists is questioning the interference of her blood thinners being taken with celecoxib  (CELEBREX ) 200 MG capsule.SABRASABRAStated this can cause internal bleeding. Patient is asking for a call back.

## 2023-12-10 NOTE — Assessment & Plan Note (Signed)
 Hydrate well

## 2023-12-10 NOTE — Assessment & Plan Note (Signed)
 Cont on Crestor , Plavix  po Stable

## 2023-12-10 NOTE — Assessment & Plan Note (Signed)
 Worse Start Celebrex  w/caution (hydrate well, take with food)

## 2023-12-10 NOTE — Assessment & Plan Note (Signed)
 On B12

## 2023-12-11 ENCOUNTER — Ambulatory Visit: Payer: Self-pay

## 2023-12-11 NOTE — Telephone Encounter (Signed)
 Tried to call pt to inform her of PCP advise and instructions for medication as follows Celebrex  would be okay to use 1-2 days a week for pain on a bad day.  Take with food.  The risk of bleeding is less than with other similar medications.  Thank you

## 2023-12-11 NOTE — Telephone Encounter (Signed)
 Called and left detailed voice message with PCP response. Also provided call back number if there are any more questions/concerns

## 2023-12-11 NOTE — Telephone Encounter (Signed)
 Celebrex  would be okay to use 1-2 days a week for pain on a bad day.  Take with food.  The risk of bleeding is less than with other similar medications.  Thank you

## 2023-12-11 NOTE — Telephone Encounter (Signed)
 FYI Only or Action Required?: Action required by provider: clinical question for provider. Is it safe to use celebrex  and plavix  together? Cautioned by pharmacist Please call patient with recommendations  Patient was last seen in primary care on 12/10/2023 by Plotnikov, Karlynn GAILS, MD.  Called Nurse Triage reporting Advice Only.  Symptoms began chronic.  Interventions attempted: Other: icy hot.  Symptoms are: unchanged.  Triage Disposition: Call PCP When Office is Open  Patient/caregiver understands and will follow disposition?: Yes   Copied from CRM [id number]. Topic: Clinical - Red Word Triage >> Dec 11, 2023  1:47 PM Victoria A wrote: [name] that prompted transfer to Nurse Triage: Patient is having pain in her back and neck due to arthritis- was prescribed medication Celebrex  but pharmacist said it could cause internal bleeding due to her being on blood thinners. Reason for Disposition  [1] Caller has NON-URGENT medicine question about med that PCP prescribed AND [2] triager unable to answer question  Answer Assessment - Initial Assessment Questions 1. NAME of MEDICINE: What medicine(s) are you calling about?     celebrex  2. QUESTION: What is your question? (e.g., double dose of medicine, side effect)     Patient would like reassurance that it is safe to take celebrex  together with her plavix  3. PRESCRIBER: Who prescribed the medicine? Reason: if prescribed by specialist, call should be referred to that group.     Plotnikov 4. SYMPTOMS: Do you have any symptoms? If Yes, ask: What symptoms are you having?  How bad are the symptoms (e.g., mild, moderate, severe)     Celebrex  for back pain  Protocols used: Medication Question Call-A-AH

## 2023-12-12 NOTE — Telephone Encounter (Unsigned)
 Copied from CRM #[id number]. Topic: Clinical - Medication Question >> Dec 11, 2023  4:28 PM Dedra B wrote: Reason for CRM: Pt returning call regarding Celebrex . Relayed message verbatim. Pt verbalized understanding.

## 2023-12-15 ENCOUNTER — Encounter: Payer: Self-pay | Admitting: Radiology

## 2023-12-17 ENCOUNTER — Telehealth: Payer: Self-pay

## 2023-12-17 ENCOUNTER — Ambulatory Visit

## 2023-12-17 NOTE — Telephone Encounter (Signed)
 Copied from CRM #[id number]. Topic: Clinical - Medical Advice >> Dec 17, 2023  5:04 PM Lauren C wrote: Reason for CRM: She was going over her OV papers from Dr. Garald, and she saw that she was diagnosed with chronic kidney disease stage 3B, but she has never been told that. She wants to know if the jardiance she is taking is good for kidney disease. Hoping to speak with anyone before EOD [phone]

## 2023-12-18 ENCOUNTER — Encounter

## 2023-12-19 ENCOUNTER — Ambulatory Visit: Payer: Self-pay

## 2023-12-19 NOTE — Telephone Encounter (Signed)
 FYI Only or Action Required?: Action required by provider: clinical question for provider.  Patient was last seen in primary care on 12/10/2023 by Plotnikov, Karlynn GAILS, MD.  Called Nurse Triage reporting Medication Problem.  Triage Disposition: Call PCP When Office is Open  Patient/caregiver understands and will follow disposition?: Yes      Copied from CRM #[id number]. Topic: Clinical - Medication Question >> Dec 19, 2023  4:35 PM Rea C wrote: Reason for CRM: Patient would like to know if it is okay to take Jardiance or not given her kidney condition. Patient is awaiting a response from her Dr. Garald since this Wednesday. But, patient is more so concerned about whether to take the Jardiance or not.   [phone] (H) >> Dec 19, 2023  4:40 PM Rea C wrote: Patient is concerned about interactions with the other medications too.       Reason for Disposition  [1] Caller has NON-URGENT medicine question about med that PCP prescribed AND [2] triager unable to answer question  Answer Assessment - Initial Assessment Questions Patient is concerned about taking her Jardiance. Patient would like advice on things she can do to improve her kidney function as well and wants to know if she should be referred to a nephrologist.      1. NAME of MEDICINE: What medicine(s) are you calling about?     Jardiance  2. QUESTION: What is your question? (e.g., double dose of medicine, side effect)     She is concerned about taking it  3. PRESCRIBER: Who prescribed the medicine? Reason: if prescribed by specialist, call should be referred to that group.     Dr. Garald  Protocols used: Medication Question Call-A-AH

## 2023-12-19 NOTE — Telephone Encounter (Signed)
 Copied from CRM #[id number]. Topic: Clinical - Medical Advice >> Dec 17, 2023  5:04 PM Lauren C wrote: Reason for CRM: She was going over her OV papers from Dr. Garald, and she saw that she was diagnosed with chronic kidney disease stage 3B, but she has never been told that. She wants to know if the jardiance she is taking is good for kidney disease

## 2023-12-21 NOTE — Telephone Encounter (Signed)
 [name] [name] has a protective effect on her kidneys.  Thanks

## 2023-12-22 NOTE — Telephone Encounter (Signed)
 Take Jardiance Thx

## 2023-12-22 NOTE — Telephone Encounter (Signed)
 Tried to inform pt of PCP advice about medication as follows Jardiance has a protective effect on her kidneys.  Thanks

## 2023-12-23 NOTE — Telephone Encounter (Signed)
 Tried to reach pt and inform her of PCP advice as follows  Take Jardiance Thx

## 2023-12-25 ENCOUNTER — Telehealth: Payer: Self-pay

## 2023-12-25 ENCOUNTER — Encounter

## 2023-12-25 DIAGNOSIS — R300 Dysuria: Secondary | ICD-10-CM

## 2023-12-25 NOTE — Telephone Encounter (Signed)
 Copied from CRM [id number]. Topic: Clinical - Medical Advice >> Dec 25, 2023 11:00 AM Burnard DEL wrote: Reason for CRM: Patient called in stating that she has been having some vaginal burning,and her husband went an got a home UTI home test that showed up positive.She would like to know if she could be prescribed an antibiotic for this?  [id number] - [place], [place] - [address]  Phone: [phone] Fax: [phone]

## 2023-12-29 NOTE — Addendum Note (Signed)
 Addended by: Paislei Dorval V on: 12/29/2023 07:21 AM   Modules accepted: Orders

## 2023-12-29 NOTE — Telephone Encounter (Signed)
 Labs have been ordered for pt to go to [facility] and have specimen tested.

## 2023-12-29 NOTE — Telephone Encounter (Signed)
 Copied from CRM #[id number]. Topic: Clinical - Request for Lab/Test Order >> Dec 29, 2023 11:00 AM Ashley R wrote: Reason for CRM:  ----------------------------------------------------------------------- From previous Reason for Contact - Call Back - No Documentation: Reason for CRM: returning call - callback [phone]. Request labs sent to Primary [facility] at [facility] for walk in labs

## 2023-12-29 NOTE — Telephone Encounter (Signed)
 Tried to call pt and ask if she can go to the lab to give a Urine so it can be tested for UTI. Lab order has been placed.

## 2023-12-29 NOTE — Telephone Encounter (Signed)
 Please do UA and culture here.  Use Aquaphor vaginally Home test could be false positive Thanks

## 2024-01-01 ENCOUNTER — Encounter

## 2024-01-05 ENCOUNTER — Telehealth: Payer: Self-pay

## 2024-01-05 NOTE — Telephone Encounter (Signed)
 Copied from CRM #[id number]. Topic: Clinical - Medical Advice >> Jan 05, 2024 11:00 AM Rea ORN wrote: Reason for CRM: Pt called to advise that she believes she has a UTI. She stated it burns to urinate and her urethra stays wet. Pt has an acute appt with [facility] that was moved from 11/25 to 12/1. Pt stated she will go walk in and will keep appt on 12/1. Pt wanted clinic to be aware.

## 2024-01-06 ENCOUNTER — Ambulatory Visit: Admitting: Family Medicine

## 2024-01-06 NOTE — Telephone Encounter (Signed)
 Spoke with the pt and was able to inform her of dropping off urine sample to the lab. Pt has stated she will go to the walk in clinic to be seen.

## 2024-01-12 ENCOUNTER — Ambulatory Visit: Admitting: Family Medicine

## 2024-01-12 ENCOUNTER — Ambulatory Visit: Attending: Internal Medicine | Admitting: Physical Therapy

## 2024-01-12 NOTE — Progress Notes (Deleted)
   Acute Office Visit  Subjective:     Patient ID: [name] [name], female    DOB: 11/01/1940, 83 y.o.   MRN: [id number]  No chief complaint on file.   HPI  Discussed the use of AI scribe software for clinical note transcription with the patient, who gave verbal consent to proceed.  History of Present Illness      ROS Per HPI      Objective:    There were no vitals taken for this visit.   Physical Exam Vitals and nursing note reviewed.  Constitutional:      General: She is not in acute distress.    Appearance: Normal appearance. She is normal weight.  HENT:     Head: Normocephalic and atraumatic.     Right Ear: External ear normal.     Left Ear: External ear normal.     Nose: Nose normal.     Mouth/Throat:     Mouth: Mucous membranes are moist.     Pharynx: Oropharynx is clear.  Eyes:     Extraocular Movements: Extraocular movements intact.     Pupils: Pupils are equal, round, and reactive to light.  Cardiovascular:     Rate and Rhythm: Normal rate and regular rhythm.     Pulses: Normal pulses.     Heart sounds: Normal heart sounds.  Pulmonary:     Effort: Pulmonary effort is normal. No respiratory distress.     Breath sounds: Normal breath sounds. No wheezing, rhonchi or rales.  Musculoskeletal:        General: Normal range of motion.     Cervical back: Normal range of motion.     Right lower leg: No edema.     Left lower leg: No edema.  Lymphadenopathy:     Cervical: No cervical adenopathy.  Neurological:     General: No focal deficit present.     Mental Status: She is alert and oriented to person, place, and time.  Psychiatric:        Mood and Affect: Mood normal.        Thought Content: Thought content normal.     No results found for any visits on 01/12/24.      Assessment & Plan:   Assessment and Plan Assessment & Plan      No orders of the defined types were placed in this encounter.    No orders of the defined types were  placed in this encounter.   No follow-ups on file.  Corean LITTIE Ku, FNP

## 2024-01-13 ENCOUNTER — Other Ambulatory Visit: Payer: Self-pay | Admitting: Internal Medicine

## 2024-01-13 ENCOUNTER — Encounter

## 2024-01-15 ENCOUNTER — Encounter

## 2024-01-21 DIAGNOSIS — H353221 Exudative age-related macular degeneration, left eye, with active choroidal neovascularization: Secondary | ICD-10-CM | POA: Diagnosis not present

## 2024-01-22 ENCOUNTER — Ambulatory Visit: Payer: Self-pay

## 2024-01-22 ENCOUNTER — Encounter

## 2024-01-22 ENCOUNTER — Ambulatory Visit
Admission: EM | Admit: 2024-01-22 | Discharge: 2024-01-22 | Disposition: A | Discharge status: Home / Self Care | Attending: Family Medicine | Admitting: Family Medicine

## 2024-01-22 DIAGNOSIS — E119 Type 2 diabetes mellitus without complications: Secondary | ICD-10-CM | POA: Diagnosis not present

## 2024-01-22 DIAGNOSIS — R309 Painful micturition, unspecified: Secondary | ICD-10-CM | POA: Diagnosis present

## 2024-01-22 DIAGNOSIS — N898 Other specified noninflammatory disorders of vagina: Secondary | ICD-10-CM | POA: Diagnosis present

## 2024-01-22 LAB — POCT URINE DIPSTICK
Bilirubin, UA: NEGATIVE
Blood, UA: NEGATIVE
Glucose, UA: >=1000 mg/dL — AB
Leukocytes, UA: NEGATIVE
Nitrite, UA: NEGATIVE
POC PROTEIN,UA: NEGATIVE
Spec Grav, UA: 1.015 (ref 1.010–1.025)
Urobilinogen, UA: 0.2 U/dL
pH, UA: 5.5 (ref 5.0–8.0)

## 2024-01-22 LAB — GLUCOSE, POCT (MANUAL RESULT ENTRY): POCT Glucose (KUC): 114 mg/dL — AB (ref 70–99)

## 2024-01-22 NOTE — Discharge Instructions (Signed)
 You were seen today for urinary symptoms.  Your urine did not show overt infection today.  However, I have sent this to the lab for further testing.  You will be notified if there is anything positive for treatment.  Your symptoms may be related to labial/vaginal moisture and irritation.  I recommend you see a Urogynecologist for further care and discussion.  You may see [facility] by calling [phone], or another of your choosing.

## 2024-01-22 NOTE — Telephone Encounter (Signed)
 FYI Only or Action Required?: FYI only for provider: advised UC due to schedule availability and pts hx. .  Patient was last seen in primary care on 12/10/2023 by Plotnikov, Karlynn GAILS, MD.  Called Nurse Triage reporting Urinary Frequency.  Symptoms began a week ago.  Interventions attempted: OTC medications: azo.  Symptoms are: gradually worsening.  Triage Disposition: See Physician Within 24 Hours  Patient/caregiver understands and will follow disposition?: Yes     Copied from CRM #[id number]. Topic: Clinical - Red Word Triage >> Jan 22, 2024 12:41 PM Jayma L wrote: Red Word that prompted transfer to Nurse Triage: Been hurting in kidney area , appt needed - back pain neck down to bottom and sides hurt as well - urine frequently Reason for Disposition  Urinating more frequently than usual (i.e., frequency) OR new-onset of the feeling of an urgent need to urinate (i.e., urgency)  Answer Assessment - Initial Assessment Questions Pt states was in hospital in November for uti and they gave her atbx and she thinks she need another script. RN asked if she was having symptoms, she stated no because she was taking AZO. Rn asked why she stated AZO, she states she was having burning with urination and itching on the outside. She states home test is between normal and positive. When asked about back pain she states she has pain from her neck to her butt, none of it is new.     1. SYMPTOM: What's the main symptom you're concerned about? (e.g., frequency, incontinence)     Increased frequency was having burning and itching 2. ONSET: When did the  symptoms start?     About a week ago 3. PAIN: Is there any pain? If Yes, ask: How bad is it? (Scale: 1-10; mild, moderate, severe)     None currently 4. CAUSE: What do you think is causing the symptoms?     Thinks it is a uti 5. OTHER SYMPTOMS: Do you have any other symptoms? (e.g., blood in urine, fever, flank pain, pain with urination)      denies  Protocols used: Urinary Symptoms-A-AH

## 2024-01-22 NOTE — ED Triage Notes (Signed)
 Patient here for possible UTI. Recent hospitalization in November and treated for this and think it has occurred again. Taking AZO recently which has helped some. Some pain with urination. No fever.

## 2024-01-22 NOTE — ED Provider Notes (Signed)
 [name]    CSN: [id number] Arrival date & time: 01/22/24  1416      History   Chief Complaint Chief Complaint  Patient presents with   UTI Symptoms    HPI [name] [name] is a 83 y.o. female.   Patient is here for urinary symptoms.  She states she was in the hospital in November, dx with UTI.  Given an abx, and felt better with that.  She states she drinks a lot of water, so there is moisture in her genital area.  She then has itching and burning.  She gets pain in the upper abdomen at times.  She is taking azo, which she states helps with the itching.  Again, she thinks her symptoms are likely due to moisture in the genital area.        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)   HTN (hypertension)    Hyperlipidemia    Hypothyroidism    Personal history of chemotherapy 2002   Personal history of radiation therapy 2002   Psoriasis    S/P thyroidectomy 07/2005   2 cm largest diameter (1 other tiny focus)/ i-131 rx 99 [facility] 08/2005   Thyroid  cancer (HCC)    Papillary Stage 1 - Dr Kassie   Vitamin B12 deficiency 2009   Vitamin D  deficiency 2009    Patient Active Problem List   Diagnosis Date Noted   Stroke-like symptoms 11/27/2023   Type 2 diabetes mellitus with hypoglycemia (HCC) 11/27/2023   Sinus congestion 11/27/2023   Normocytic anemia 11/27/2023   Cough 11/27/2023   Chronic kidney disease, stage 3b (HCC) 11/27/2023   Hypothyroidism 11/27/2023   Hypoglycemia 11/26/2023   Diarrhea due to drug 10/28/2023   Diabetes mellitus type 2, noninsulin dependent (HCC) 09/03/2023   Memory loss 07/21/2023   Rectal bleeding 07/12/2023   Intrinsic eczema 07/12/2023   Bowel habit changes 12/18/2022   GERD  (gastroesophageal reflux disease) 03/05/2022   Dysphagia 03/05/2022   Low back pain 12/04/2021   Gait disorder 12/04/2021   Palpitations 02/19/2021   DOE (dyspnea on exertion) 11/16/2020   Chronic sinusitis 11/16/2020   Retinal artery occlusion, branch, right 08/09/2020   History of TIA (transient ischemic attack) 08/09/2020   Hyperparathyroidism 06/13/2020   Cerebrovascular disease 03/21/2020   Ataxia 07/20/2019   Seroma of breast 07/20/2019   Wrist pain 03/23/2019   Coronary artery disease 01/20/2018   Breast cyst, left 09/17/2017   Aortic atherosclerosis 07/28/2017   Osteoporosis 05/07/2017   Genetic testing 12/05/2016   Family history of breast cancer    Port catheter in place 10/23/2016   Encounter for antineoplastic chemotherapy 10/23/2016   Malignant neoplasm of upper-outer quadrant of left breast in female, estrogen receptor positive (HCC) 07/29/2016   Well adult exam 06/05/2015   Neoplasm of uncertain behavior of skin 06/05/2015   Adenopathy, cervical 06/05/2015   Hyperglycemia 11/06/2010   Actinic keratoses 05/25/2010   Hypertension associated with diabetes (HCC) 02/23/2010   Chronic fatigue 11/08/2009   VERTIGO 07/11/2009   ECZEMA 10/19/2008   CT, CHEST, ABNORMAL 05/30/2008   STYE 02/17/2008   B12 deficiency 12/16/2007   Vitamin D  deficiency 12/16/2007   HYPOTHYROIDISM, POSTSURGICAL 12/01/2007   ALLERGIC RESPIRATORY DISEASE,  EXTRINSIC 11/11/2007   THYROID  CANCER 08/19/2007   Dyslipidemia 08/19/2007   Anxiety disorder 08/19/2007   Situational depression 08/19/2007   Allergic rhinitis 08/19/2007   History of breast cancer 08/19/2007    Past Surgical History:  Procedure Laterality Date   BREAST LUMPECTOMY     BREAST LUMPECTOMY WITH RADIOACTIVE SEED AND SENTINEL LYMPH NODE BIOPSY Left 09/11/2016   Procedure: LEFT BREAST LUMPECTOMY WITH RADIOACTIVE SEED AND LEFT AXILLARY SENTINEL LYMPH NODE BIOPSY WITH BLUE DYE INJECTION;  Surgeon: Gail Favorite, MD;   Location: [facility];  Service: General;  Laterality: Left;   ESOPHAGEAL MANOMETRY N/A 10/08/2023   Procedure: MANOMETRY, ESOPHAGUS;  Surgeon: Federico Rosario BROCKS, MD;  Location: [facility];  Service: Gastroenterology;  Laterality: N/A;   IR FLUORO GUIDE PORT INSERTION RIGHT  09/13/2016   IR REMOVAL TUN ACCESS W/ PORT W/O FL MOD SED  12/30/2016   IR US  GUIDE VASC ACCESS RIGHT  09/13/2016   MASTECTOMY Right    Right   PORTACATH PLACEMENT N/A 09/11/2016   Procedure: ATTEMPTED INSERTION PORT-A-CATH WITH ULTRA SOUND GUIDANCE;  Surgeon: Gail Favorite, MD;  Location: [facility];  Service: General;  Laterality: N/A;   REDUCTION MAMMAPLASTY Left 2005   THYROIDECTOMY  2007    OB History   No obstetric history on file.      Home Medications    Prior to Admission medications  Medication Sig Start Date End Date Taking? Authorizing Provider  iron  polysaccharides (NIFEREX) 150 MG capsule Take 150 mg by mouth daily. 01/13/24  Yes [provider]  phenazopyridine (PYRIDIUM) 95 MG tablet Take 95 mg by mouth 3 (three) times daily as needed for pain.   Yes [provider]  acetaminophen  (TYLENOL ) 650 MG CR tablet Take 650 mg by mouth every 8 (eight) hours as needed for pain.    [provider]  ALPRAZolam  (XANAX ) 1 MG tablet TAKE 1 TABLET BY MOUTH 3 TIMES DAILY AS NEEDED FOR ANXIETY. 01/13/24   Plotnikov, Aleksei V, MD  anastrozole  (ARIMIDEX ) 1 MG tablet TAKE 1 TABLET BY MOUTH EVERY DAY 09/09/23   Causey, Lindsey Cornetto, NP  Ascorbic Acid (VITAMIN C PO) Take 1 tablet by mouth daily.    [provider]  Blood Glucose Monitoring Suppl DEVI 1 each by Does not apply route in the morning, at noon, and at bedtime. May substitute to any manufacturer covered by patient's insurance. 10/15/23   Plotnikov, Aleksei V, MD  Camphor-Menthol -Methyl Sal (SALONPAS) 3.02-17-08 % PTCH Apply 1 patch topically daily as needed (pain).    [provider]   carboxymethylcellulose 1 % ophthalmic solution Place 1 drop into both eyes daily as needed (dry eyes).    [provider]  celecoxib  (CELEBREX ) 200 MG capsule Take 1 capsule (200 mg total) by mouth daily as needed for moderate pain (pain score 4-6). 12/10/23   Plotnikov, Aleksei V, MD  clobetasol  ointment (TEMOVATE ) 0.05 % Apply 1 Application topically 2 (two) times daily. rash 10/15/23   Plotnikov, Karlynn GAILS, MD  clopidogrel  (PLAVIX ) 75 MG tablet Take 1 tablet (75 mg total) by mouth daily. 09/09/23   Plotnikov, Karlynn GAILS, MD  Continuous Glucose Receiver (DEXCOM G7 RECEIVER) DEVI Use as directed 11/03/23   Plotnikov, Aleksei V, MD  Continuous Glucose Sensor (DEXCOM G7 SENSOR) MISC Replace q 10 days 11/03/23   Plotnikov, Aleksei V, MD  donepezil  (ARICEPT ) 5 MG tablet Take 1 tablet (5 mg total) by mouth at bedtime. 07/21/23   Plotnikov, Karlynn GAILS, MD  empagliflozin  (JARDIANCE ) 10 MG TABS tablet Take 1 tablet (10 mg total) by mouth daily. 11/19/23   Plotnikov, Aleksei V, MD  erythromycin  ophthalmic ointment Place 1 Application into the left eye daily.    [provider]  Glucagon  (GVOKE HYPOPEN  1-PACK) 1 MG/0.2ML SOAJ Inject 0.2 mLs into the skin daily as needed. 11/26/23   Plotnikov, Aleksei V, MD  ipratropium (ATROVENT ) 0.06 % nasal spray Place 2 sprays into the nose 3 (three) times daily. 12/10/23 12/09/24  Plotnikov, Karlynn GAILS, MD  levothyroxine  (SYNTHROID ) 112 MCG tablet TAKE 1 TABLET BY MOUTH EVERY DAY 12/04/23   Plotnikov, Aleksei V, MD  MAGNESIUM PO Take 1 tablet by mouth daily.    [provider]  metoprolol  succinate (TOPROL -XL) 25 MG 24 hr tablet Take 12.5 mg by mouth at bedtime. 06/03/23   [provider]  prednisoLONE  acetate (PRED FORTE ) 1 % ophthalmic suspension Place 1 drop into both eyes at bedtime as needed (For clear vision). 04/15/23   [provider]  rosuvastatin  (CRESTOR ) 20 MG tablet TAKE 1 TABLET BY MOUTH EVERY DAY 12/01/23   Plotnikov, Aleksei  V, MD  traZODone  (DESYREL ) 50 MG tablet Take 1 tablet (50 mg total) by mouth at bedtime as needed for sleep. 12/04/23   Plotnikov, Aleksei V, MD  vitamin B-12 (CYANOCOBALAMIN ) 1000 MCG tablet Take 1,000 mcg by mouth daily.    [provider]    Family History Family History  Problem Relation Age of Onset   Stroke Mother    Allergies Mother    Asthma Mother    Clotting disorder Mother    Heart disease Father 58       MI   Allergies Sister    Asthma Sister    Breast cancer Sister 55       Lymphoma 71; currently 59   Lymphoma Sister    Hyperparathyroidism Sister    Asthma Brother    Leukemia Brother        dx 26s   Allergies Daughter    Asthma Daughter    Allergies Sister    Breast cancer Sister 28       currently 55   Kidney cancer Paternal Aunt        kidney ca; deceased 81   Liver cancer Paternal Uncle        unk. primary (liver)   Throat cancer Maternal Grandmother        deceased 8   Lung cancer Maternal Grandfather        deceased 27   Pancreatic cancer Paternal Grandfather        deceased 54   Cancer Paternal Aunt        abdominal; deceased 64    Social History Social History[1]   Allergies   Pneumococcal vaccines, Citalopram , Metformin  and related, Sulfa antibiotics, Tape, and Tegaderm ag mesh [silver]   Review of Systems Review of Systems  Constitutional: Negative.   HENT: Negative.    Respiratory: Negative.    Cardiovascular: Negative.   Gastrointestinal: Negative.   Genitourinary:  Positive for frequency.     Physical Exam Triage Vital Signs ED Triage Vitals  Encounter Vitals Group     BP 01/22/24 1437 113/76     Girls Systolic BP Percentile --      Girls Diastolic BP Percentile --      Boys Systolic BP Percentile --      Boys Diastolic BP Percentile --      Pulse Rate 01/22/24 1437 (!) 103  Resp 01/22/24 1437 18     Temp 01/22/24 1437 (!) 97.2 F (36.2 C)     Temp Source 01/22/24 1437 Oral     SpO2 01/22/24 1437 96 %      Weight 01/22/24 1435 145 lb 1 oz (65.8 kg)     Height 01/22/24 1435 5' 4 (1.626 m)     Head Circumference --      Peak Flow --      Pain Score 01/22/24 1432 0     Pain Loc --      Pain Education --      Exclude from Growth Chart --    No data found.  Updated Vital Signs BP 113/76 (BP Location: Left Arm)   Pulse (!) 103   Temp (!) 97.2 F (36.2 C) (Oral)   Resp 18   Ht 5' 4 (1.626 m)   Wt 65.8 kg   SpO2 96%   BMI 24.90 kg/m   Visual Acuity Right Eye Distance:   Left Eye Distance:   Bilateral Distance:    Right Eye Near:   Left Eye Near:    Bilateral Near:     Physical Exam Constitutional:      Appearance: Normal appearance. She is normal weight.  Cardiovascular:     Rate and Rhythm: Normal rate and regular rhythm.  Pulmonary:     Effort: Pulmonary effort is normal.     Breath sounds: Normal breath sounds.  Abdominal:     Palpations: Abdomen is soft.     Tenderness: There is no abdominal tenderness. There is no right CVA tenderness, left CVA tenderness, guarding or rebound.  Neurological:     General: No focal deficit present.     Mental Status: She is alert.  Psychiatric:        Mood and Affect: Mood normal.      UC Treatments / Results  Labs (all labs ordered are listed, but only abnormal results are displayed) Labs Reviewed  POCT URINE DIPSTICK - Abnormal; Notable for the following components:      Result Value   Clarity, UA cloudy (*)    Glucose, UA >=1,000 (*)    Ketones, POC UA trace (5) (*)    All other components within normal limits  GLUCOSE, POCT (MANUAL RESULT ENTRY) - Abnormal; Notable for the following components:   POCT Glucose ([facility]) 114 (*)    All other components within normal limits  URINE CULTURE   CBG 114  EKG   Radiology No results found.  Procedures Procedures (including critical care time)  Medications Ordered in UC Medications - No data to display  Initial Impression / Assessment and Plan / UC Course  I have  reviewed the triage vital signs and the nursing notes.  Pertinent labs & imaging results that were available during my care of the patient were reviewed by me and considered in my medical decision making (see chart for details).   Final Clinical Impressions(s) / UC Diagnoses   Final diagnoses:  Diabetes mellitus type 2, noninsulin dependent (HCC)  Urinary pain  Vaginal irritation     Discharge Instructions      You were seen today for urinary symptoms.  Your urine did not show overt infection today.  However, I have sent this to the lab for further testing.  You will be notified if there is anything positive for treatment.  Your symptoms may be related to labial/vaginal moisture and irritation.  I recommend you see a Urogynecologist for further  care and discussion.  You may see  [facility] by calling [phone], or another of your choosing.     ED Prescriptions   None    PDMP not reviewed this encounter.     [1]  Social History Tobacco Use   Smoking status: Never   Smokeless tobacco: Never  Vaping Use   Vaping status: Never Used  Substance Use Topics   Alcohol  use: No   Drug use: No     Darral Longs, MD 01/22/24 1529

## 2024-01-23 ENCOUNTER — Ambulatory Visit (HOSPITAL_COMMUNITY): Payer: Self-pay

## 2024-01-23 LAB — URINE CULTURE: Culture: NO GROWTH

## 2024-01-29 ENCOUNTER — Encounter

## 2024-02-09 ENCOUNTER — Encounter: Payer: Self-pay | Admitting: Emergency Medicine

## 2024-02-09 ENCOUNTER — Ambulatory Visit
Admission: EM | Admit: 2024-02-09 | Discharge: 2024-02-09 | Disposition: A | Discharge status: Home / Self Care | Attending: Family Medicine | Admitting: Family Medicine

## 2024-02-09 DIAGNOSIS — J069 Acute upper respiratory infection, unspecified: Secondary | ICD-10-CM

## 2024-02-09 DIAGNOSIS — H538 Other visual disturbances: Secondary | ICD-10-CM | POA: Diagnosis not present

## 2024-02-09 DIAGNOSIS — R000 Tachycardia, unspecified: Secondary | ICD-10-CM | POA: Diagnosis not present

## 2024-02-09 LAB — POC SOFIA SARS ANTIGEN FIA: SARS Coronavirus 2 Ag: NEGATIVE

## 2024-02-09 LAB — GLUCOSE, POCT (MANUAL RESULT ENTRY): POC Glucose: 147 mg/dL — AB (ref 70–99)

## 2024-02-09 LAB — POCT INFLUENZA A/B
Influenza A, POC: NEGATIVE
Influenza B, POC: NEGATIVE

## 2024-02-09 MED ORDER — BENZONATATE 100 MG PO CAPS: 100.0000 mg | ORAL_CAPSULE | Freq: Three times a day (TID) | ORAL | 0 refills | Status: AC | PRN

## 2024-02-09 NOTE — ED Provider Notes (Signed)
 " [name] [name]    CSN: [id number] Arrival date & time: 02/09/24  1237      History   Chief Complaint Chief Complaint  Patient presents with   Headache   Hypertension    HPI [name] [name] is a 83 y.o. female.    Headache Hypertension Associated symptoms include headaches.  Here for headache and increased nasal congestion and cough.  Symptoms began in the last 24 hours.  She noted her blood pressure to be elevated this morning at 147/100.  It usually is much lower than that.  No fever or chills noted.  She took some Tylenol  last evening that did help the headache.  No nausea vomiting or diarrhea.  She is allergic to sulfa and citalopram  and metformin  and tape.  Past medical history significant for diabetes  She has had some fast heart rate today and palpitations.  She takes Celebrex  and she takes Plavix .  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)   HTN (hypertension)    Hyperlipidemia    Hypothyroidism    Personal history of chemotherapy 2002   Personal history of radiation therapy 2002   Psoriasis    S/P thyroidectomy 07/2005   2 cm largest diameter (1 other tiny focus)/ i-131 rx 99 [facility] 08/2005   Thyroid  cancer (HCC)    Papillary Stage 1 - Dr Kassie   Vitamin B12 deficiency 2009   Vitamin D  deficiency 2009    Patient Active Problem List   Diagnosis Date Noted   Stroke-like symptoms 11/27/2023   Type 2 diabetes mellitus with hypoglycemia (HCC) 11/27/2023   Sinus congestion 11/27/2023   Normocytic anemia 11/27/2023   Cough 11/27/2023   Chronic kidney disease, stage 3b (HCC) 11/27/2023   Hypothyroidism 11/27/2023   Hypoglycemia 11/26/2023   Diarrhea due to drug  10/28/2023   Diabetes mellitus type 2, noninsulin dependent (HCC) 09/03/2023   Memory loss 07/21/2023   Rectal bleeding 07/12/2023   Intrinsic eczema 07/12/2023   Bowel habit changes 12/18/2022   GERD (gastroesophageal reflux disease) 03/05/2022   Dysphagia 03/05/2022   Low back pain 12/04/2021   Gait disorder 12/04/2021   Palpitations 02/19/2021   DOE (dyspnea on exertion) 11/16/2020   Chronic sinusitis 11/16/2020   Retinal artery occlusion, branch, right 08/09/2020   History of TIA (transient ischemic attack) 08/09/2020   Hyperparathyroidism 06/13/2020   Cerebrovascular disease 03/21/2020   Ataxia 07/20/2019   Seroma of breast 07/20/2019   Wrist pain 03/23/2019   Coronary artery disease 01/20/2018   Breast cyst, left 09/17/2017   Aortic atherosclerosis 07/28/2017   Osteoporosis 05/07/2017   Genetic testing 12/05/2016   Family history of breast cancer    Port catheter in place 10/23/2016   Encounter for antineoplastic chemotherapy 10/23/2016   Malignant neoplasm of upper-outer quadrant of left breast in female, estrogen receptor positive (HCC) 07/29/2016   Well adult exam 06/05/2015   Neoplasm of uncertain behavior of skin 06/05/2015   Adenopathy, cervical 06/05/2015   Hyperglycemia 11/06/2010   Actinic keratoses 05/25/2010   Hypertension associated with diabetes (HCC) 02/23/2010   Chronic fatigue 11/08/2009   VERTIGO 07/11/2009   ECZEMA 10/19/2008   CT, CHEST, ABNORMAL 05/30/2008  STYE 02/17/2008   B12 deficiency 12/16/2007   Vitamin D  deficiency 12/16/2007   HYPOTHYROIDISM, POSTSURGICAL 12/01/2007   ALLERGIC RESPIRATORY DISEASE, EXTRINSIC 11/11/2007   THYROID  CANCER 08/19/2007   Dyslipidemia 08/19/2007   Anxiety disorder 08/19/2007   Situational depression 08/19/2007   Allergic rhinitis 08/19/2007   History of breast cancer 08/19/2007    Past Surgical History:  Procedure Laterality Date   BREAST LUMPECTOMY     BREAST LUMPECTOMY WITH RADIOACTIVE SEED AND  SENTINEL LYMPH NODE BIOPSY Left 09/11/2016   Procedure: LEFT BREAST LUMPECTOMY WITH RADIOACTIVE SEED AND LEFT AXILLARY SENTINEL LYMPH NODE BIOPSY WITH BLUE DYE INJECTION;  Surgeon: Gail Favorite, MD;  Location: [facility];  Service: General;  Laterality: Left;   ESOPHAGEAL MANOMETRY N/A 10/08/2023   Procedure: MANOMETRY, ESOPHAGUS;  Surgeon: Federico Rosario BROCKS, MD;  Location: [facility];  Service: Gastroenterology;  Laterality: N/A;   IR FLUORO GUIDE PORT INSERTION RIGHT  09/13/2016   IR REMOVAL TUN ACCESS W/ PORT W/O FL MOD SED  12/30/2016   IR US  GUIDE VASC ACCESS RIGHT  09/13/2016   MASTECTOMY Right    Right   PORTACATH PLACEMENT N/A 09/11/2016   Procedure: ATTEMPTED INSERTION PORT-A-CATH WITH ULTRA SOUND GUIDANCE;  Surgeon: Gail Favorite, MD;  Location: [facility];  Service: General;  Laterality: N/A;   REDUCTION MAMMAPLASTY Left 2005   THYROIDECTOMY  2007    OB History   No obstetric history on file.      Home Medications    Prior to Admission medications  Medication Sig Start Date End Date Taking? Authorizing Provider  benzonatate  (TESSALON ) 100 MG capsule Take 1 capsule (100 mg total) by mouth 3 (three) times daily as needed for cough. 02/09/24  Yes Vonna Sharlet POUR, MD  acetaminophen  (TYLENOL ) 650 MG CR tablet Take 650 mg by mouth every 8 (eight) hours as needed for pain.    [provider]  ALPRAZolam  (XANAX ) 1 MG tablet TAKE 1 TABLET BY MOUTH 3 TIMES DAILY AS NEEDED FOR ANXIETY. 01/13/24   Plotnikov, Aleksei V, MD  anastrozole  (ARIMIDEX ) 1 MG tablet TAKE 1 TABLET BY MOUTH EVERY DAY 09/09/23   Causey, Lindsey Cornetto, NP  Ascorbic Acid (VITAMIN C PO) Take 1 tablet by mouth daily.    [provider]  Blood Glucose Monitoring Suppl DEVI 1 each by Does not apply route in the morning, at noon, and at bedtime. May substitute to any manufacturer covered by patient's insurance. 10/15/23   Plotnikov, Aleksei V, MD  Camphor-Menthol -Methyl  Sal (SALONPAS) 3.02-17-08 % PTCH Apply 1 patch topically daily as needed (pain).    [provider]  carboxymethylcellulose 1 % ophthalmic solution Place 1 drop into both eyes daily as needed (dry eyes).    [provider]  celecoxib  (CELEBREX ) 200 MG capsule Take 1 capsule (200 mg total) by mouth daily as needed for moderate pain (pain score 4-6). 12/10/23   Plotnikov, Aleksei V, MD  clobetasol  ointment (TEMOVATE ) 0.05 % Apply 1 Application topically 2 (two) times daily. rash 10/15/23   Plotnikov, Karlynn GAILS, MD  clopidogrel  (PLAVIX ) 75 MG tablet Take 1 tablet (75 mg total) by mouth daily. 09/09/23   Plotnikov, Karlynn GAILS, MD  Continuous Glucose Receiver (DEXCOM G7 RECEIVER) DEVI Use as directed 11/03/23   Plotnikov, Aleksei V, MD  Continuous Glucose Sensor (DEXCOM G7 SENSOR) MISC Replace q 10 days 11/03/23   Plotnikov, Aleksei V, MD  donepezil  (ARICEPT ) 5 MG tablet Take 1 tablet (5 mg total) by mouth at bedtime.  07/21/23   Plotnikov, Karlynn GAILS, MD  empagliflozin  (JARDIANCE ) 10 MG TABS tablet Take 1 tablet (10 mg total) by mouth daily. 11/19/23   Plotnikov, Aleksei V, MD  erythromycin  ophthalmic ointment Place 1 Application into the left eye daily.    [provider]  Glucagon  (GVOKE HYPOPEN  1-PACK) 1 MG/0.2ML SOAJ Inject 0.2 mLs into the skin daily as needed. 11/26/23   Plotnikov, Aleksei V, MD  ipratropium (ATROVENT ) 0.06 % nasal spray Place 2 sprays into the nose 3 (three) times daily. 12/10/23 12/09/24  Plotnikov, Karlynn GAILS, MD  iron  polysaccharides (NIFEREX) 150 MG capsule Take 150 mg by mouth daily. 01/13/24   [provider]  levothyroxine  (SYNTHROID ) 112 MCG tablet TAKE 1 TABLET BY MOUTH EVERY DAY 12/04/23   Plotnikov, Aleksei V, MD  MAGNESIUM PO Take 1 tablet by mouth daily.    [provider]  metoprolol  succinate (TOPROL -XL) 25 MG 24 hr tablet Take 12.5 mg by mouth at bedtime. 06/03/23   [provider]  phenazopyridine (PYRIDIUM) 95 MG tablet Take  95 mg by mouth 3 (three) times daily as needed for pain.    [provider]  prednisoLONE  acetate (PRED FORTE ) 1 % ophthalmic suspension Place 1 drop into both eyes at bedtime as needed (For clear vision). 04/15/23   [provider]  rosuvastatin  (CRESTOR ) 20 MG tablet TAKE 1 TABLET BY MOUTH EVERY DAY 12/01/23   Plotnikov, Aleksei V, MD  traZODone  (DESYREL ) 50 MG tablet Take 1 tablet (50 mg total) by mouth at bedtime as needed for sleep. 12/04/23   Plotnikov, Aleksei V, MD  vitamin B-12 (CYANOCOBALAMIN ) 1000 MCG tablet Take 1,000 mcg by mouth daily.    [provider]    Family History Family History  Problem Relation Age of Onset   Stroke Mother    Allergies Mother    Asthma Mother    Clotting disorder Mother    Heart disease Father 47       MI   Allergies Sister    Asthma Sister    Breast cancer Sister 58       Lymphoma 22; currently 53   Lymphoma Sister    Hyperparathyroidism Sister    Asthma Brother    Leukemia Brother        dx 66s   Allergies Daughter    Asthma Daughter    Allergies Sister    Breast cancer Sister 2       currently 43   Kidney cancer Paternal Aunt        kidney ca; deceased 2   Liver cancer Paternal Uncle        unk. primary (liver)   Throat cancer Maternal Grandmother        deceased 49   Lung cancer Maternal Grandfather        deceased 41   Pancreatic cancer Paternal Grandfather        deceased 43   Cancer Paternal Aunt        abdominal; deceased 60    Social History Social History[1]   Allergies   Pneumococcal vaccines, Citalopram , Metformin  and related, Sulfa antibiotics, Tape, and Tegaderm ag mesh [silver]   Review of Systems Review of Systems  Neurological:  Positive for headaches.     Physical Exam Triage Vital Signs ED Triage Vitals  Encounter Vitals Group     BP 02/09/24 1256 91/64     Girls Systolic BP Percentile --      Girls Diastolic BP Percentile --  Boys Systolic BP Percentile --       Boys Diastolic BP Percentile --      Pulse Rate 02/09/24 1256 (!) 122     Resp 02/09/24 1256 18     Temp 02/09/24 1256 (!) 97.4 F (36.3 C)     Temp Source 02/09/24 1256 Oral     SpO2 02/09/24 1256 95 %     Weight --      Height --      Head Circumference --      Peak Flow --      Pain Score 02/09/24 1254 0     Pain Loc --      Pain Education --      Exclude from Growth Chart --    No data found.  Updated Vital Signs BP 108/76 (BP Location: Left Arm)   Pulse (!) 122   Temp (!) 97.4 F (36.3 C) (Oral)   Resp 18   SpO2 95%   Visual Acuity Right Eye Distance:   Left Eye Distance:   Bilateral Distance:    Right Eye Near:   Left Eye Near:    Bilateral Near:     Physical Exam Vitals (Blood pressure initially here was 91 systolic but on repeat was 108/76.  Her heart rate is 120 on initial triage) reviewed.  Constitutional:      General: She is not in acute distress.    Appearance: She is not ill-appearing, toxic-appearing or diaphoretic.     Comments: She is alert and well-appearing in the exam room.  She is able to talk in complete sentences.  HENT:     Right Ear: Tympanic membrane and ear canal normal.     Left Ear: Tympanic membrane and ear canal normal.     Nose: Congestion present.     Mouth/Throat:     Mouth: Mucous membranes are moist.     Comments: There is clear mucus draining Eyes:     Extraocular Movements: Extraocular movements intact.     Conjunctiva/sclera: Conjunctivae normal.     Pupils: Pupils are equal, round, and reactive to light.  Cardiovascular:     Rate and Rhythm: Regular rhythm. Tachycardia present.     Heart sounds: No murmur heard.    Comments: Heart rate initially on exam by me is mildly tachycardic and appears regular overall. Pulmonary:     Effort: Pulmonary effort is normal. No respiratory distress.     Breath sounds: No stridor. No wheezing, rhonchi or rales.  Musculoskeletal:     Cervical back: Neck supple.  Lymphadenopathy:      Cervical: No cervical adenopathy.  Skin:    Capillary Refill: Capillary refill takes less than 2 seconds.     Coloration: Skin is not jaundiced or pale.  Neurological:     General: No focal deficit present.     Mental Status: She is alert and oriented to person, place, and time.  Psychiatric:        Behavior: Behavior normal.      UC Treatments / Results  Labs (all labs ordered are listed, but only abnormal results are displayed) Labs Reviewed  GLUCOSE, POCT (MANUAL RESULT ENTRY) - Abnormal; Notable for the following components:      Result Value   POC Glucose 147 (*)    All other components within normal limits  POCT INFLUENZA A/B  POC SOFIA SARS ANTIGEN FIA    EKG   Radiology No results found.  Procedures Procedures (including critical [name] time)  Medications Ordered in UC Medications - No data to display  Initial Impression / Assessment and Plan / UC Course  I have reviewed the triage vital signs and the nursing notes.  Pertinent labs & imaging results that were available during my [name] of the patient were reviewed by me and considered in my medical decision making (see chart for details).     Flu and COVID testing is negative.  Her sugar was 147 here. Her EKG shows normal sinus rhythm at a rate of 90 by the time the EKG is done.  Tessalon  Perles are sent in for her cough.  I recommended that she continue the Tylenol  for any aches or pain.   Final Clinical Impressions(s) / UC Diagnoses   Final diagnoses:  Viral URI  Tachycardia  Blurry vision     Discharge Instructions      Your sugar was 147 here.  Although it is over the limit of normal, it is most likely due to your being ill with other things.  Your EKG was normal  Your testing for flu and COVID was negative.  This is most likely some other viral illness causing your symptoms.  Continue the Tylenol  as needed for the headache or other pains.  I think your elevated blood pressure was  from lack of rest and the pain.  It was improved here and normal.  Make sure you are drinking plenty of fluids.  Take benzonatate  100 mg, 1 tab every 8 hours as needed for cough.      ED Prescriptions     Medication Sig Dispense Auth. Provider   benzonatate  (TESSALON ) 100 MG capsule Take 1 capsule (100 mg total) by mouth 3 (three) times daily as needed for cough. 21 capsule Anaiz Qazi K, MD      I have reviewed the PDMP during this encounter.    [1]  Social History Tobacco Use   Smoking status: Never   Smokeless tobacco: Never  Vaping Use   Vaping status: Never Used  Substance Use Topics   Alcohol  use: No   Drug use: No     Vonna Sharlet POUR, MD 02/09/24 1547  "

## 2024-02-09 NOTE — Discharge Instructions (Signed)
 Your sugar was 147 here.  Although it is over the limit of normal, it is most likely due to your being ill with other things.  Your EKG was normal  Your testing for flu and COVID was negative.  This is most likely some other viral illness causing your symptoms.  Continue the Tylenol  as needed for the headache or other pains.  I think your elevated blood pressure was from lack of rest and the pain.  It was improved here and normal.  Make sure you are drinking plenty of fluids.  Take benzonatate  100 mg, 1 tab every 8 hours as needed for cough.

## 2024-02-09 NOTE — ED Triage Notes (Signed)
 Patient has an headache, ears has been ring, and blood pressure has been high since last night.  Patient has taken something for the headache

## 2024-02-19 ENCOUNTER — Ambulatory Visit: Admitting: Internal Medicine

## 2024-02-19 ENCOUNTER — Encounter: Payer: Self-pay | Admitting: Internal Medicine

## 2024-02-19 VITALS — BP 98/70 | HR 81 | Temp 97.9°F | Ht 64.0 in | Wt 130.0 lb

## 2024-02-19 DIAGNOSIS — N1832 Chronic kidney disease, stage 3b: Secondary | ICD-10-CM

## 2024-02-19 DIAGNOSIS — E538 Deficiency of other specified B group vitamins: Secondary | ICD-10-CM

## 2024-02-19 DIAGNOSIS — E559 Vitamin D deficiency, unspecified: Secondary | ICD-10-CM

## 2024-02-19 DIAGNOSIS — R269 Unspecified abnormalities of gait and mobility: Secondary | ICD-10-CM | POA: Diagnosis not present

## 2024-02-19 DIAGNOSIS — R413 Other amnesia: Secondary | ICD-10-CM | POA: Diagnosis not present

## 2024-02-19 DIAGNOSIS — E1122 Type 2 diabetes mellitus with diabetic chronic kidney disease: Secondary | ICD-10-CM

## 2024-02-19 DIAGNOSIS — E119 Type 2 diabetes mellitus without complications: Secondary | ICD-10-CM

## 2024-02-19 DIAGNOSIS — D509 Iron deficiency anemia, unspecified: Secondary | ICD-10-CM

## 2024-02-19 LAB — CBC WITH DIFFERENTIAL/PLATELET
Basophils Absolute: 0 K/uL (ref 0.0–0.1)
Basophils Relative: 0.2 % (ref 0.0–3.0)
Eosinophils Absolute: 0.1 K/uL (ref 0.0–0.7)
Eosinophils Relative: 0.8 % (ref 0.0–5.0)
HCT: 42.2 % (ref 36.0–46.0)
Hemoglobin: 13.3 g/dL (ref 12.0–15.0)
Lymphocytes Relative: 25.2 % (ref 12.0–46.0)
Lymphs Abs: 2.3 K/uL (ref 0.7–4.0)
MCHC: 31.4 g/dL (ref 30.0–36.0)
MCV: 85.6 fl (ref 78.0–100.0)
Monocytes Absolute: 0.8 K/uL (ref 0.1–1.0)
Monocytes Relative: 8.9 % (ref 3.0–12.0)
Neutro Abs: 5.9 K/uL (ref 1.4–7.7)
Neutrophils Relative %: 64.9 % (ref 43.0–77.0)
Platelets: 378 K/uL (ref 150.0–400.0)
RBC: 4.93 Mil/uL (ref 3.87–5.11)
RDW: 20.3 % — ABNORMAL HIGH (ref 11.5–15.5)
WBC: 9.2 K/uL (ref 4.0–10.5)

## 2024-02-19 LAB — COMPREHENSIVE METABOLIC PANEL WITH GFR
ALT: 15 U/L (ref 3–35)
AST: 24 U/L (ref 5–37)
Albumin: 4 g/dL (ref 3.5–5.2)
Alkaline Phosphatase: 81 U/L (ref 39–117)
BUN: 26 mg/dL — ABNORMAL HIGH (ref 6–23)
CO2: 26 meq/L (ref 19–32)
Calcium: 10.2 mg/dL (ref 8.4–10.5)
Chloride: 105 meq/L (ref 96–112)
Creatinine, Ser: 1.51 mg/dL — ABNORMAL HIGH (ref 0.40–1.20)
GFR: 31.46 mL/min — ABNORMAL LOW
Glucose, Bld: 103 mg/dL — ABNORMAL HIGH (ref 70–99)
Potassium: 4.1 meq/L (ref 3.5–5.1)
Sodium: 139 meq/L (ref 135–145)
Total Bilirubin: 0.5 mg/dL (ref 0.2–1.2)
Total Protein: 7.2 g/dL (ref 6.0–8.3)

## 2024-02-19 LAB — HEMOGLOBIN A1C: Hgb A1c MFr Bld: 5.8 % (ref 4.6–6.5)

## 2024-02-19 NOTE — Assessment & Plan Note (Addendum)
 Hydrate well Check GFR On jardiance 

## 2024-02-19 NOTE — Assessment & Plan Note (Signed)
 On B12

## 2024-02-19 NOTE — Progress Notes (Signed)
 "  Subjective:  Patient ID: [name] [name], female    DOB: [birth year]  Age: 84 y.o. MRN: [id number]  CC: Follow-up (Patient states her kidneys were class two something and another and didn't know she had diabetes until she went to the hospital. Patient states she found out a few things when she went into the hospital. Concerned about kidneys. Patient has a few things to discuss. )   HPI [name] [name] presents for DM, CRI, anxiety Pt lost wt  Outpatient Medications Prior to Visit  Medication Sig Dispense Refill   acetaminophen  (TYLENOL ) 650 MG CR tablet Take 650 mg by mouth every 8 (eight) hours as needed for pain.     ALPRAZolam  (XANAX ) 1 MG tablet TAKE 1 TABLET BY MOUTH 3 TIMES DAILY AS NEEDED FOR ANXIETY. 90 tablet 2   anastrozole  (ARIMIDEX ) 1 MG tablet TAKE 1 TABLET BY MOUTH EVERY DAY 90 tablet 1   Ascorbic Acid (VITAMIN C PO) Take 1 tablet by mouth daily.     benzonatate  (TESSALON ) 100 MG capsule Take 1 capsule (100 mg total) by mouth 3 (three) times daily as needed for cough. 21 capsule 0   Blood Glucose Monitoring Suppl DEVI 1 each by Does not apply route in the morning, at noon, and at bedtime. May substitute to any manufacturer covered by patient's insurance. 1 each 0   Camphor-Menthol -Methyl Sal (SALONPAS) 3.02-17-08 % PTCH Apply 1 patch topically daily as needed (pain).     carboxymethylcellulose 1 % ophthalmic solution Place 1 drop into both eyes daily as needed (dry eyes).     celecoxib  (CELEBREX ) 200 MG capsule Take 1 capsule (200 mg total) by mouth daily as needed for moderate pain (pain score 4-6). 30 capsule 1   clobetasol  ointment (TEMOVATE ) 0.05 % Apply 1 Application topically 2 (two) times daily. rash 120 g 1   clopidogrel  (PLAVIX ) 75 MG tablet Take 1 tablet (75 mg total) by mouth daily. 90 tablet 3   Continuous Glucose Receiver (DEXCOM G7 RECEIVER) DEVI Use as directed 1 each 1   Continuous Glucose Sensor (DEXCOM G7 SENSOR) MISC Replace q 10 days 3 each 11    empagliflozin  (JARDIANCE ) 10 MG TABS tablet Take 1 tablet (10 mg total) by mouth daily. 30 tablet 11   erythromycin  ophthalmic ointment Place 1 Application into the left eye daily.     Glucagon  (GVOKE HYPOPEN  1-PACK) 1 MG/0.2ML SOAJ Inject 0.2 mLs into the skin daily as needed. 0.4 mL 3   ipratropium (ATROVENT ) 0.06 % nasal spray Place 2 sprays into the nose 3 (three) times daily. 15 mL 2   iron  polysaccharides (NIFEREX) 150 MG capsule Take 150 mg by mouth daily.     levothyroxine  (SYNTHROID ) 112 MCG tablet TAKE 1 TABLET BY MOUTH EVERY DAY 90 tablet 3   MAGNESIUM PO Take 1 tablet by mouth daily.     metoprolol  succinate (TOPROL -XL) 25 MG 24 hr tablet Take 12.5 mg by mouth at bedtime.     phenazopyridine (PYRIDIUM) 95 MG tablet Take 95 mg by mouth 3 (three) times daily as needed for pain.     prednisoLONE  acetate (PRED FORTE ) 1 % ophthalmic suspension Place 1 drop into both eyes at bedtime as needed (For clear vision).     rosuvastatin  (CRESTOR ) 20 MG tablet TAKE 1 TABLET BY MOUTH EVERY DAY 90 tablet 1   traZODone  (DESYREL ) 50 MG tablet Take 1 tablet (50 mg total) by mouth at bedtime as needed for sleep. 30 tablet 5   vitamin  B-12 (CYANOCOBALAMIN ) 1000 MCG tablet Take 1,000 mcg by mouth daily.     donepezil  (ARICEPT ) 5 MG tablet Take 1 tablet (5 mg total) by mouth at bedtime. 90 tablet 1   Facility-Administered Medications Prior to Visit  Medication Dose Route Frequency Provider Last Rate Last Admin   denosumab  (PROLIA ) injection 60 mg  60 mg Subcutaneous Once Causey, Lindsey Cornetto, NP        ROS: Review of Systems  Constitutional:  Positive for fatigue. Negative for activity change, appetite change, chills and unexpected weight change.  HENT:  Negative for congestion, mouth sores and sinus pressure.   Eyes:  Negative for visual disturbance.  Respiratory:  Negative for cough and chest tightness.   Gastrointestinal:  Negative for abdominal pain and nausea.  Genitourinary:  Negative for  difficulty urinating, frequency and vaginal pain.  Musculoskeletal:  Positive for back pain and gait problem.  Skin:  Negative for pallor and rash.  Neurological:  Positive for weakness. Negative for dizziness, tremors, numbness and headaches.  Psychiatric/Behavioral:  Positive for decreased concentration. Negative for confusion, sleep disturbance and suicidal ideas. The patient is nervous/anxious.     Objective:  BP 98/70   Pulse 81   Temp 97.9 F (36.6 C) (Oral)   Ht 5' 4 (1.626 m)   Wt 130 lb (59 kg)   SpO2 91%   BMI 22.31 kg/m   BP Readings from Last 3 Encounters:  02/19/24 98/70  02/09/24 108/76  01/22/24 113/76    Wt Readings from Last 3 Encounters:  02/19/24 130 lb (59 kg)  01/22/24 134 lb 6.4 oz (61 kg)  11/22/23 145 lb (65.8 kg)    Physical Exam Constitutional:      General: She is not in acute distress.    Appearance: Normal appearance. She is well-developed.  HENT:     Head: Normocephalic.     Right Ear: External ear normal.     Left Ear: External ear normal.     Nose: Nose normal.  Eyes:     General:        Right eye: No discharge.        Left eye: No discharge.     Conjunctiva/sclera: Conjunctivae normal.     Pupils: Pupils are equal, round, and reactive to light.  Neck:     Thyroid : No thyromegaly.     Vascular: No JVD.     Trachea: No tracheal deviation.  Cardiovascular:     Rate and Rhythm: Normal rate and regular rhythm.     Heart sounds: Normal heart sounds.  Pulmonary:     Effort: No respiratory distress.     Breath sounds: No stridor. No wheezing.  Abdominal:     General: Bowel sounds are normal. There is no distension.     Palpations: Abdomen is soft. There is no mass.     Tenderness: There is no abdominal tenderness. There is no guarding or rebound.  Musculoskeletal:        General: No tenderness.     Cervical back: Normal range of motion and neck supple. No rigidity.     Right lower leg: No edema.     Left lower leg: No edema.   Lymphadenopathy:     Cervical: No cervical adenopathy.  Skin:    Findings: No erythema or rash.  Neurological:     Mental Status: Mental status is at baseline.     Cranial Nerves: No cranial nerve deficit.     Motor: No abnormal muscle tone.  Coordination: Coordination normal.     Gait: Gait abnormal.     Deep Tendon Reflexes: Reflexes abnormal.  Psychiatric:        Behavior: Behavior normal.        Thought Content: Thought content normal.        Judgment: Judgment normal.     Lab Results  Component Value Date   WBC 9.2 02/19/2024   HGB 13.3 02/19/2024   HCT 42.2 02/19/2024   PLT 378.0 02/19/2024   GLUCOSE 103 (H) 02/19/2024   CHOL 112 11/28/2023   TRIG 145 11/28/2023   HDL 30 (L) 11/28/2023   LDLDIRECT 125.0 06/05/2015   LDLCALC 53 11/28/2023   ALT 15 02/19/2024   AST 24 02/19/2024   NA 139 02/19/2024   K 4.1 02/19/2024   CL 105 02/19/2024   CREATININE 1.51 (H) 02/19/2024   BUN 26 (H) 02/19/2024   CO2 26 02/19/2024   TSH 0.410 11/22/2023   INR 1.1 08/16/2021   HGBA1C 5.8 02/19/2024    No results found.  Assessment & Plan:   Problem List Items Addressed This Visit     B12 deficiency   On B12      Vitamin D  deficiency   On Vit D      Gait disorder   Better      Memory loss   Better       Relevant Orders   Comprehensive metabolic panel with GFR (Completed)   Hemoglobin A1c (Completed)   Diabetes mellitus type 2, noninsulin dependent (HCC)   Better control diabetes.  Use glucose tablets for low sugar Do not take Metformin  and Repaglinide  for now Cont to monitor CBG.  Install application on your phone Endocrinology --pending  On jardiance       Relevant Orders   Comprehensive metabolic panel with GFR (Completed)   Hemoglobin A1c (Completed)   Chronic kidney disease, stage 3b (HCC) - Primary   Hydrate well Check GFR On jardiance       Relevant Orders   Comprehensive metabolic panel with GFR (Completed)   Other Visit Diagnoses        Iron  deficiency anemia, unspecified iron  deficiency anemia type       Relevant Orders   CBC with Differential/Platelet (Completed)   Iron , TIBC and Ferritin Panel   IBC + Ferritin (Completed)         No orders of the defined types were placed in this encounter.     Follow-up: Return in about 3 months (around 05/19/2024) for a follow-up visit.  Marolyn Noel, MD "

## 2024-02-19 NOTE — Assessment & Plan Note (Signed)
 Better

## 2024-02-19 NOTE — Patient Instructions (Addendum)
 Recent large-scale observational studies provide strong evidence that the shingles vaccine is associated with a reduced risk of developing dementia. It may also help slow cognitive decline in individuals who already have dementia.  Key Findings from Recent Research Multiple natural experiment studies, which leveraged unique vaccination policies to minimize bias, have consistently found a protective link:  Reduced Risk: One large study, published in [place], analyzed health records from over 280,000 older adults in [place] and found that those who received the live-attenuated shingles vaccine (Zostavax, now discontinued in the [place] ) were approximately 20% less likely to develop dementia over a seven-year follow-up period. Slower Progression: A follow-up study published in Cell indicated that for those already living with dementia, the vaccine appeared to slow the progression of the disease and reduced dementia-related deaths by nearly half over nine years. Vascular Dementia Protection: Other research presented at [facility] 2025 indicated that the vaccine lowered the risk of vascular dementia by 50%. Newer Vaccine: A separate study published in [place] [facility] suggested that the newer, more effective recombinant vaccine (Shingrix, currently used in the [place] ) also offers significant protection against dementia, with an association of lower risk than the older vaccine type. Stronger Effect in Women: Several studies noted that the protective effect against dementia was more pronounced in women than in men, possibly due to differences in immune responses or dementia pathogenesis.  Why Might the Vaccine Help? Researchers are still working to determine the exact mechanism, but current theories center on the idea that preventing the shingles virus (varicella-zoster) from reactivating helps protect the brain:  Reduced Inflammation: The most likely explanation is that the vaccine prevents shingles infections and subsequent  inflammation in the nervous system, which is a known risk factor for neurodegenerative diseases like dementia. Immune System Modulation: It's also possible that the vaccine boosts the immune system more broadly, helping the body combat other processes related to cognitive decline.  Important Considerations Observational Data: While the evidence is strong, these findings primarily come from observational studies, which show an association, not a definitive cause-and-effect relationship. A large-scale randomized controlled trial is the gold standard for conclusive proof, but such trials are difficult to conduct for dementia due to the time and cost involved. Public Health Recommendation: The [place]. Centers for Disease Control and Prevention (CDC) already recommends the two-dose Shingrix vaccine for all healthy adults aged 42 and older primarily to prevent shingles and its painful complications. The potential added benefit for dementia prevention provides another compelling reason to get vaccinated.    [place] from [place]'web website on-line   Breast cancer stage:   Date Classification Stage Status   Pathologic Stage IB (pT1b, pN0, cM0, G3, ER-, PR-, HER2-) Unsigned (in progress)    Disease status: Not Documented

## 2024-02-19 NOTE — Assessment & Plan Note (Addendum)
 Better control diabetes.  Use glucose tablets for low sugar Do not take Metformin  and Repaglinide  for now Cont to monitor CBG.  Install application on your phone Endocrinology --pending  On jardiance 

## 2024-02-19 NOTE — Assessment & Plan Note (Signed)
 On Vit D

## 2024-02-20 ENCOUNTER — Telehealth: Payer: Self-pay

## 2024-02-20 LAB — IBC + FERRITIN
Ferritin: 19.8 ng/mL (ref 10.0–291.0)
Iron: 381 ug/dL — ABNORMAL HIGH (ref 42–145)
Saturation Ratios: 83 % — ABNORMAL HIGH (ref 20.0–50.0)
TIBC: 459.2 ug/dL — ABNORMAL HIGH (ref 250.0–450.0)
Transferrin: 328 mg/dL (ref 212.0–360.0)

## 2024-02-20 NOTE — Telephone Encounter (Signed)
 Copied from CRM #[id number]. Topic: Clinical - Medical Advice >> Feb 20, 2024 11:00 AM Adelita E wrote: Reason for CRM: Patient had visit 1/8 and after reviewing her progress notes, patient noticed that her chronic kidney disease is stage 3b, whereas in October it was stage 2, patient was unaware that it has been worsening. Patient also found that she has Vitamin D  deficiency and this was not brought to her attention. Please call patient to discuss. Patient would like lab results mailed to address on file.

## 2024-02-23 ENCOUNTER — Other Ambulatory Visit: Payer: Self-pay | Admitting: Internal Medicine

## 2024-02-23 ENCOUNTER — Telehealth: Payer: Self-pay

## 2024-02-23 NOTE — Telephone Encounter (Signed)
 Copied from CRM [id number]. Topic: Clinical - Lab/Test Results >> Feb 23, 2024 11:40 AM Delon DASEN wrote: Reason for CRM: patient wants to go over labs, unable to access [place]- [phone]

## 2024-02-24 ENCOUNTER — Telehealth: Payer: Self-pay

## 2024-02-24 NOTE — Telephone Encounter (Signed)
 I am awaiting for provider to results labs.

## 2024-02-24 NOTE — Telephone Encounter (Signed)
 Copied from CRM [id number]. Topic: Clinical - Lab/Test Results >> Feb 23, 2024 11:40 AM Delon DASEN wrote: Reason for CRM: patient wants to go over labs, unable to access mychart- [phone] >> Feb 23, 2024  5:19 PM Lauren C wrote: Pt calling back to get labs results. Nothing available for e2c2 to relay. Pt would like to request labs be printed off and mailed to her house as she no longer has access to [employer]. She is hoping for call before 1:15PM tomorrow. Pt wants to know if she should take vitamin D . She says full voicemail is okay to be left.

## 2024-02-25 NOTE — Telephone Encounter (Signed)
 Patient called back to check on the status of labs results and was advised labs are still awaiting provider review and someone will call her back once labs are reviewed.   CB#[phone]

## 2024-02-27 NOTE — Telephone Encounter (Signed)
 Copied from CRM [id number]. Topic: Clinical - Lab/Test Results >> Feb 23, 2024 11:40 AM Delon DASEN wrote: Reason for CRM: patient wants to go over labs, unable to access mychart- [phone] >> Feb 27, 2024  3:06 PM Robinson H wrote: Patient following up on message sent on 1/12 regarding lab results, no messages or notes in system from provider of office. Patient states this is the third call  [place] [phone]  >> Feb 23, 2024  5:19 PM Tinnie C wrote: Pt calling back to get labs results. Nothing available for e2c2 to relay. Pt would like to request labs be printed off and mailed to her house as she no longer has access to [employer]. She is hoping for call before 1:15PM tomorrow. Pt wants to know if she should take vitamin D . She says full voicemail is okay to be left.

## 2024-02-29 ENCOUNTER — Ambulatory Visit: Payer: Self-pay | Admitting: Internal Medicine

## 2024-03-01 ENCOUNTER — Telehealth: Payer: Self-pay

## 2024-03-01 NOTE — Telephone Encounter (Signed)
 LDVM for pt per PCP Chronic renal insufficiency has been stable for years-stage III.  She needs to hydrate well and take Jardiance  for renal protection. Vitamin D  deficiency is not new.  Needs to take vitamin D  2000 units daily Thanks

## 2024-03-01 NOTE — Telephone Encounter (Signed)
 Copied from CRM [id number]. Topic: Clinical - Medical Advice >> Mar 01, 2024  2:28 PM Aisha D wrote: Reason for CRM: Pt stated that she is still experiencing some issues with arthritis and wants to speak with Dr.Plotnikov to see what he recommends. Pt also wants a copy of her recent lab results mailed to her address.

## 2024-03-01 NOTE — Telephone Encounter (Signed)
 Chronic renal insufficiency has been stable for years-stage III.  She needs to hydrate well and take Jardiance  for renal protection. Vitamin D  deficiency is not new.  Needs to take vitamin D  2000 units daily Thanks

## 2024-03-01 NOTE — Telephone Encounter (Signed)
 Addressed in a different message.

## 2024-03-05 NOTE — Telephone Encounter (Addendum)
 Use Tylenol , topical meds, i.e. blue emu cream, local heat.  Office visit if problems.  Thank you

## 2024-03-09 ENCOUNTER — Other Ambulatory Visit: Payer: Self-pay | Admitting: Adult Health

## 2024-03-09 ENCOUNTER — Ambulatory Visit: Payer: Self-pay

## 2024-03-09 NOTE — Telephone Encounter (Signed)
 Pt c/o constipation, a knot in her lower abdomen, poor appetite, recent unintentional 20 lb weight loss, appt scheduled for Thursday afternoon. Pt declined first available appt for tomorrow.    FYI Only or Action Required?: FYI only for provider: appointment scheduled on 1/29.  Patient was last seen in primary care on 02/19/2024 by Plotnikov, Karlynn GAILS, MD.  Called Nurse Triage reporting Abdominal Pain.  Symptoms began a week ago.  Interventions attempted: OTC medications: laxatives.  Symptoms are: gradually worsening.  Triage Disposition: See Physician Within 24 Hours  Patient/caregiver understands and will follow disposition?: Yes   Reason for Triage: Patient having stomach and has bump on her stomach   Reason for Disposition  [1] MILD pain (e.g., does not interfere with normal activities) AND [2] pain comes and goes (cramps) AND [3] present > 48 hours  (Exception: This same abdominal pain is a chronic symptom recurrent or ongoing AND present > 4 weeks.)  Answer Assessment - Initial Assessment Questions 1. LOCATION: Where does it hurt?      Down below my belly button  3. ONSET: When did the pain begin? (e.g., minutes, hours or days ago)      In the last week  4. SUDDEN: Gradual or sudden onset?     Sudden  6. SEVERITY: How bad is the pain?  (e.g., Scale 1-10; mild, moderate, or severe) Feels tight, is uncomfortable, worse when she sleeps on that right side      7. RECURRENT SYMPTOM: Have you ever had this type of stomach pain before? If Yes, ask: When was the last time? and What happened that time?      Denies  10. OTHER SYMPTOMS: Do you have any other symptoms? (e.g., back pain, diarrhea, fever, urination pain, vomiting)       Constipation (using laxatives), poor appetite, I can feel a little knot on the right side down below my belly button. Reports she's lost 20 lbs without trying since November. Blood sugar 104 today. Last BM this  morning.  Protocols used: Abdominal Pain - Female-A-AH

## 2024-03-10 NOTE — Telephone Encounter (Signed)
 Please schedule office visit with any available provider.  Go to ER if worse.  Thank you

## 2024-03-10 NOTE — Telephone Encounter (Signed)
 Pt has an apptmnt with provider on 03/11/2026.

## 2024-03-11 ENCOUNTER — Ambulatory Visit: Admitting: Internal Medicine

## 2024-03-16 ENCOUNTER — Ambulatory Visit: Admitting: Internal Medicine

## 2024-03-16 ENCOUNTER — Encounter: Payer: Self-pay | Admitting: Internal Medicine

## 2024-03-16 VITALS — BP 100/70 | HR 91 | Temp 98.2°F | Ht 64.0 in | Wt 127.4 lb

## 2024-03-16 DIAGNOSIS — R413 Other amnesia: Secondary | ICD-10-CM

## 2024-03-16 DIAGNOSIS — N1832 Chronic kidney disease, stage 3b: Secondary | ICD-10-CM

## 2024-03-16 DIAGNOSIS — R634 Abnormal weight loss: Secondary | ICD-10-CM

## 2024-03-16 DIAGNOSIS — G8929 Other chronic pain: Secondary | ICD-10-CM

## 2024-03-16 DIAGNOSIS — R194 Change in bowel habit: Secondary | ICD-10-CM

## 2024-03-16 DIAGNOSIS — E118 Type 2 diabetes mellitus with unspecified complications: Secondary | ICD-10-CM

## 2024-03-16 NOTE — Progress Notes (Unsigned)
 "  Subjective:   Patient ID: [name] [name], female    DOB: 1940/06/16, 84 y.o.   MRN: [id number]  Discussed the use of AI scribe software for clinical note transcription with the patient, who gave verbal consent to proceed.  History of Present Illness [name] [name] is an 84 year old female with diabetes and chronic kidney disease who presents with weight loss and constipation.  She has experienced significant weight loss since her diabetes diagnosis a few months ago, losing approximately 20 pounds over the past six months. Her weight has decreased from 146 pounds in September to 126 pounds recently. Despite adhering to a diet high in chicken, salmon, vegetable beef soup, and various vegetables, she struggles to gain weight. She has been consuming high-calorie foods like nuts and peanut butter without success.  She experiences frequent urination, noting that she can urinate every time she passes a bathroom. She also reports constipation, characterized by difficulty with bowel movements, requiring nightly medication to facilitate bowel movements. Her stools are described as hard and pellet-like.  Her current medications include Jardiance , which she started after issues with Metformin , and vitamin D . She also takes supplements for B12 and vitamin D  deficiencies but cannot tolerate iron . She denies any recent episodes of low blood sugar, with glucose levels consistently under 150.  She has a history of chronic kidney disease, and her GFR has remained in the mid-thirties over the past year. No pain is associated with her kidney condition.  She reports sinus drainage and right ear ringing, for which she uses nasal drops and Mucinex . She also mentions a history of arthritis, primarily affecting her shoulders and neck, and takes Tylenol  for pain management. She avoids NSAIDs due to her kidney condition.  She has a history of macular degeneration and has been receiving injections for this condition. She  notes a change in her vision since her diabetes diagnosis.  Review of Systems  Constitutional:  Positive for unexpected weight change.  HENT: Negative.    Eyes: Negative.   Respiratory:  Negative for cough, chest tightness and shortness of breath.   Cardiovascular:  Negative for chest pain, palpitations and leg swelling.  Gastrointestinal:  Negative for abdominal distention, abdominal pain, constipation, diarrhea, nausea and vomiting.  Musculoskeletal:  Positive for arthralgias and back pain.  Skin: Negative.   Neurological: Negative.   Psychiatric/Behavioral:  Positive for decreased concentration.     Objective:  Physical Exam Constitutional:      Appearance: She is well-developed.  HENT:     Head: Normocephalic and atraumatic.  Cardiovascular:     Rate and Rhythm: Normal rate and regular rhythm.  Pulmonary:     Effort: Pulmonary effort is normal. No respiratory distress.     Breath sounds: Normal breath sounds. No wheezing or rales.  Abdominal:     General: Bowel sounds are normal. There is no distension.     Palpations: Abdomen is soft. There is no mass.     Tenderness: There is no abdominal tenderness.     Hernia: No hernia is present.  Musculoskeletal:     Cervical back: Normal range of motion.  Skin:    General: Skin is warm and dry.  Neurological:     Mental Status: She is alert and oriented to person, place, and time.     Coordination: Coordination normal.     Vitals:   03/16/24 1419  BP: 100/70  Pulse: 91  Temp: 98.2 F (36.8 C)  TempSrc: Oral  SpO2: 96%  Weight: 127 lb 6.4 oz (57.8 kg)  Height: 5' 4 (1.626 m)    Assessment and Plan Assessment & Plan Unintentional weight loss in the setting of type 2 diabetes mellitus and chronic kidney disease   She has lost 20 pounds over six months and struggles to gain weight despite a high-calorie diet. Blood glucose is well-controlled with Jardiance . There are concerns about malnutrition and dietary needs due to  dramatic changes she has made to diet. She was noticing a mass in her stomach less than nickel size and freely mobile. I was not able to find on exam and she states gone. Suspect this was a loop of bowel (in setting of constipation lately) She is referred to pharmacist Chrissy for dietary counseling and weight gain strategies. Consider Glucerna for additional calories and nutrition. Encourage high-calorie, nutrient-dense foods like nuts and peanut butter. Close follow up 1 month with PCP after dietary changes.   Type 2 diabetes mellitus with complication Blood glucose levels are well-controlled with Jardiance , with readings between 100-112 mg/dL. She was previously on metformin  but now only takes Jardiance , with no hypoglycemic episodes. Jardiance  also helps slow CKD progression. Continue Jardiance  for diabetes management and monitor blood glucose levels regularly. Complicated by hyperlipidemia.   Chronic kidney disease stage 3b   GFR is in the mid-30s and stable over the past year, with no symptoms. Jardiance  is used to slow progression, and NSAIDs are avoided to prevent kidney damage. Continue Jardiance  to slow CKD progression and avoid NSAIDs.  Memory loss She was struggling to maintain focus during the visit and is taking opioids which can impact memory. She is taking aricept  5 mg daily and advised to continue.   Constipation/bowel habit changes She experiences difficulty with bowel movements, with infrequent and pellet-like stools, possibly due to diabetes medications. Continue to monitor bowel habits and adjust diet as needed.  Osteoarthritis/low back pain She has pain in her shoulders, neck, and back due to arthritis and uses acetaminophen  for pain management, avoiding NSAIDs due to kidney impact. Continue acetaminophen  for pain management and avoid NSAIDs.   "

## 2024-03-17 ENCOUNTER — Telehealth: Payer: Self-pay

## 2024-03-17 NOTE — Progress Notes (Signed)
 Care Guide Pharmacy Note  03/17/2024 Name: [name] [name] MRN: [id number] DOB: 10-Jan-1941  Referred By: Garald Karlynn GAILS, MD Reason for referral: Call Attempt #1 and Complex Care Management (Outreach to sch ref w/ pharm)   [name] [name] is a 84 y.o. year old female who is a primary care patient of Plotnikov, Aleksei V, MD.  [name] [name] was referred to the pharmacist for assistance related to: DMII  Successful contact was made with the patient to discuss pharmacy services including being ready for the pharmacist to call at least 5 minutes before the scheduled appointment time and to have medication bottles and any blood pressure readings ready for review. The patient agreed to meet with the pharmacist via telephone visit on 04/05/24.  [name] [name] [facility], [facility] Guide Direct Dial: [phone]  Fax: [phone]

## 2024-03-18 ENCOUNTER — Ambulatory Visit: Admitting: "Endocrinology

## 2024-03-18 ENCOUNTER — Ambulatory Visit: Payer: Self-pay

## 2024-03-18 NOTE — Telephone Encounter (Signed)
 FYI Only or Action Required?: Action required by provider: clinical question for provider.  Patient was last seen in primary care on 03/16/2024 by [name] Almarie LABOR, MD.  Called Nurse Triage reporting Results.  Symptoms began several days ago.  Interventions attempted: Nothing.  Symptoms are: unchanged.  Triage Disposition: No disposition on file.  Patient/caregiver understands and will follow disposition?:    Copied from CRM [id number]. Topic: Clinical - Lab/Test Results >> Mar 18, 2024  3:14 PM [name] [name] wrote: Reason for CRM: patient wanted clarity on recent labs  Iron  supplement, stopped taking iron  few weeks ago 250mg ; take 1 tab every other day.  Patient requesting lab results for Iron , TIBC, Ferritin. Answer Assessment - Initial Assessment Questions 1. REASON FOR CALL: What is the main reason for your call? or How can I best help you?   Patient requests iron  supplement, stopped taking iron  few weeks ago 250mg ; take 1 tab every other day.  Patient asked if she should stop iron  supplement or take once per week?  Patient requesting lab results for Iron , TIBC, Ferritin.  2. SYMPTOMS : Do you have any symptoms?  Denies any symptoms  Advised call back or ED/911 if symptoms occurs. Patient verbalized understanding.  Protocols used: Information Only Call - No Triage-A-AH

## 2024-03-18 NOTE — Telephone Encounter (Signed)
 Tried to call pt to further discuss her concerns.

## 2024-03-19 DIAGNOSIS — R634 Abnormal weight loss: Secondary | ICD-10-CM | POA: Insufficient documentation

## 2024-04-01 ENCOUNTER — Inpatient Hospital Stay

## 2024-04-05 ENCOUNTER — Other Ambulatory Visit

## 2024-04-13 ENCOUNTER — Ambulatory Visit: Admitting: "Endocrinology

## 2024-04-28 ENCOUNTER — Ambulatory Visit: Admitting: "Endocrinology

## 2024-05-19 ENCOUNTER — Ambulatory Visit: Admitting: Internal Medicine

## 2024-08-30 ENCOUNTER — Ambulatory Visit

## 2024-09-30 ENCOUNTER — Inpatient Hospital Stay

## 2024-09-30 ENCOUNTER — Inpatient Hospital Stay: Admitting: Hematology and Oncology
